# Patient Record
Sex: Female | Born: 1950 | Race: Black or African American | Hispanic: No | Smoking: Former smoker
Health system: Southern US, Community
[De-identification: ages and names within clinical notes are randomized; demographics above are authoritative.]

## PROBLEM LIST (undated history)

## (undated) DIAGNOSIS — R609 Edema, unspecified: Secondary | ICD-10-CM

## (undated) DIAGNOSIS — N189 Chronic kidney disease, unspecified: Secondary | ICD-10-CM

## (undated) DIAGNOSIS — I639 Cerebral infarction, unspecified: Secondary | ICD-10-CM

## (undated) DIAGNOSIS — I10 Essential (primary) hypertension: Secondary | ICD-10-CM

## (undated) DIAGNOSIS — R0600 Dyspnea, unspecified: Secondary | ICD-10-CM

## (undated) DIAGNOSIS — R519 Headache, unspecified: Secondary | ICD-10-CM

## (undated) DIAGNOSIS — T8859XA Other complications of anesthesia, initial encounter: Secondary | ICD-10-CM

## (undated) DIAGNOSIS — F32A Depression, unspecified: Secondary | ICD-10-CM

## (undated) DIAGNOSIS — M1990 Unspecified osteoarthritis, unspecified site: Secondary | ICD-10-CM

## (undated) DIAGNOSIS — F419 Anxiety disorder, unspecified: Secondary | ICD-10-CM

## (undated) HISTORY — DX: Unspecified osteoarthritis, unspecified site: M19.90

---

## 2009-03-08 ENCOUNTER — Ambulatory Visit: Payer: Self-pay | Admitting: Cardiology

## 2009-03-08 ENCOUNTER — Inpatient Hospital Stay (HOSPITAL_COMMUNITY): Admission: EM | Admit: 2009-03-08 | Discharge: 2009-03-12 | Payer: Self-pay | Admitting: Emergency Medicine

## 2009-03-09 ENCOUNTER — Ambulatory Visit: Payer: Self-pay | Admitting: Vascular Surgery

## 2009-03-09 ENCOUNTER — Ambulatory Visit: Payer: Self-pay | Admitting: Physical Medicine & Rehabilitation

## 2009-03-09 ENCOUNTER — Encounter (INDEPENDENT_AMBULATORY_CARE_PROVIDER_SITE_OTHER): Payer: Self-pay | Admitting: Internal Medicine

## 2009-03-12 ENCOUNTER — Encounter (INDEPENDENT_AMBULATORY_CARE_PROVIDER_SITE_OTHER): Payer: Self-pay | Admitting: Internal Medicine

## 2009-10-02 ENCOUNTER — Emergency Department (HOSPITAL_COMMUNITY): Admission: EM | Admit: 2009-10-02 | Discharge: 2009-10-02 | Payer: Self-pay | Admitting: Emergency Medicine

## 2009-10-02 ENCOUNTER — Encounter (INDEPENDENT_AMBULATORY_CARE_PROVIDER_SITE_OTHER): Payer: Self-pay | Admitting: Emergency Medicine

## 2009-10-02 ENCOUNTER — Ambulatory Visit: Payer: Self-pay | Admitting: Vascular Surgery

## 2009-11-28 ENCOUNTER — Inpatient Hospital Stay (HOSPITAL_COMMUNITY): Admission: EM | Admit: 2009-11-28 | Discharge: 2009-11-30 | Payer: Self-pay | Admitting: Emergency Medicine

## 2009-11-28 ENCOUNTER — Encounter (INDEPENDENT_AMBULATORY_CARE_PROVIDER_SITE_OTHER): Payer: Self-pay | Admitting: Internal Medicine

## 2009-11-29 ENCOUNTER — Ambulatory Visit: Payer: Self-pay | Admitting: Surgery

## 2009-11-29 ENCOUNTER — Encounter (INDEPENDENT_AMBULATORY_CARE_PROVIDER_SITE_OTHER): Payer: Self-pay | Admitting: Internal Medicine

## 2010-04-01 ENCOUNTER — Encounter
Admission: RE | Admit: 2010-04-01 | Discharge: 2010-04-01 | Payer: Self-pay | Source: Home / Self Care | Attending: Internal Medicine | Admitting: Internal Medicine

## 2010-04-10 ENCOUNTER — Encounter
Admission: RE | Admit: 2010-04-10 | Discharge: 2010-04-10 | Payer: Self-pay | Source: Home / Self Care | Attending: Internal Medicine | Admitting: Internal Medicine

## 2010-04-14 ENCOUNTER — Encounter: Payer: Self-pay | Admitting: Internal Medicine

## 2010-04-16 ENCOUNTER — Other Ambulatory Visit: Payer: Self-pay | Admitting: Radiology

## 2010-04-16 ENCOUNTER — Encounter
Admission: RE | Admit: 2010-04-16 | Discharge: 2010-04-16 | Payer: Self-pay | Source: Home / Self Care | Attending: Internal Medicine | Admitting: Internal Medicine

## 2010-06-06 LAB — DIFFERENTIAL
Basophils Absolute: 0 10*3/uL (ref 0.0–0.1)
Basophils Relative: 0 % (ref 0–1)
Lymphocytes Relative: 40 % (ref 12–46)
Monocytes Absolute: 0.4 10*3/uL (ref 0.1–1.0)
Neutro Abs: 2.4 10*3/uL (ref 1.7–7.7)
Neutrophils Relative %: 50 % (ref 43–77)

## 2010-06-06 LAB — CBC
MCHC: 34.2 g/dL (ref 30.0–36.0)
Platelets: 218 10*3/uL (ref 150–400)
RDW: 13.3 % (ref 11.5–15.5)

## 2010-06-06 LAB — COMPREHENSIVE METABOLIC PANEL
Albumin: 3.7 g/dL (ref 3.5–5.2)
BUN: 8 mg/dL (ref 6–23)
Calcium: 9.5 mg/dL (ref 8.4–10.5)
Chloride: 102 mEq/L (ref 96–112)
Creatinine, Ser: 1.01 mg/dL (ref 0.4–1.2)
Total Bilirubin: 0.4 mg/dL (ref 0.3–1.2)

## 2010-06-06 LAB — LIPID PANEL
Total CHOL/HDL Ratio: 2.1 RATIO
Triglycerides: 45 mg/dL (ref <150)
VLDL: 9 mg/dL (ref 0–40)

## 2010-06-06 LAB — CARDIAC PANEL(CRET KIN+CKTOT+MB+TROPI)
CK, MB: 0.7 ng/mL (ref 0.3–4.0)
Total CK: 49 U/L (ref 7–177)

## 2010-06-06 LAB — GLUCOSE, CAPILLARY
Glucose-Capillary: 101 mg/dL — ABNORMAL HIGH (ref 70–99)
Glucose-Capillary: 166 mg/dL — ABNORMAL HIGH (ref 70–99)

## 2010-06-06 LAB — HEMOGLOBIN A1C: Mean Plasma Glucose: 151 mg/dL — ABNORMAL HIGH (ref <117)

## 2010-06-06 LAB — APTT: aPTT: 34 seconds (ref 24–37)

## 2010-06-06 LAB — URINALYSIS, ROUTINE W REFLEX MICROSCOPIC
Glucose, UA: 100 mg/dL — AB
Hgb urine dipstick: NEGATIVE
Protein, ur: NEGATIVE mg/dL
Specific Gravity, Urine: 1.01 (ref 1.005–1.030)

## 2010-06-06 LAB — PROTIME-INR: Prothrombin Time: 14.3 seconds (ref 11.6–15.2)

## 2010-06-06 LAB — BASIC METABOLIC PANEL
CO2: 30 mEq/L (ref 19–32)
Chloride: 101 mEq/L (ref 96–112)
Glucose, Bld: 115 mg/dL — ABNORMAL HIGH (ref 70–99)
Potassium: 3.3 mEq/L — ABNORMAL LOW (ref 3.5–5.1)
Sodium: 139 mEq/L (ref 135–145)

## 2010-06-09 LAB — COMPREHENSIVE METABOLIC PANEL
Albumin: 3.9 g/dL (ref 3.5–5.2)
BUN: 8 mg/dL (ref 6–23)
Creatinine, Ser: 0.88 mg/dL (ref 0.4–1.2)
Glucose, Bld: 201 mg/dL — ABNORMAL HIGH (ref 70–99)
Total Bilirubin: 0.4 mg/dL (ref 0.3–1.2)
Total Protein: 7.5 g/dL (ref 6.0–8.3)

## 2010-06-24 LAB — URINALYSIS, ROUTINE W REFLEX MICROSCOPIC
Glucose, UA: 500 mg/dL — AB
Ketones, ur: 15 mg/dL — AB
Nitrite: NEGATIVE
Protein, ur: NEGATIVE mg/dL
pH: 5 (ref 5.0–8.0)

## 2010-06-24 LAB — HOMOCYSTEINE: Homocysteine: 11.3 umol/L (ref 4.0–15.4)

## 2010-06-24 LAB — CK TOTAL AND CKMB (NOT AT ARMC)
CK, MB: 0.6 ng/mL (ref 0.3–4.0)
Total CK: 44 U/L (ref 7–177)

## 2010-06-24 LAB — LIPID PANEL
HDL: 34 mg/dL — ABNORMAL LOW (ref >39)
LDL Cholesterol: 65 mg/dL (ref 0–99)
Triglycerides: 97 mg/dL (ref <150)
VLDL: 19 mg/dL (ref 0–40)

## 2010-06-24 LAB — GLUCOSE, CAPILLARY
Glucose-Capillary: 100 mg/dL — ABNORMAL HIGH (ref 70–99)
Glucose-Capillary: 105 mg/dL — ABNORMAL HIGH (ref 70–99)
Glucose-Capillary: 106 mg/dL — ABNORMAL HIGH (ref 70–99)
Glucose-Capillary: 109 mg/dL — ABNORMAL HIGH (ref 70–99)
Glucose-Capillary: 109 mg/dL — ABNORMAL HIGH (ref 70–99)
Glucose-Capillary: 116 mg/dL — ABNORMAL HIGH (ref 70–99)
Glucose-Capillary: 117 mg/dL — ABNORMAL HIGH (ref 70–99)
Glucose-Capillary: 129 mg/dL — ABNORMAL HIGH (ref 70–99)
Glucose-Capillary: 68 mg/dL — ABNORMAL LOW (ref 70–99)
Glucose-Capillary: 90 mg/dL (ref 70–99)

## 2010-06-24 LAB — CBC
MCV: 82.5 fL (ref 78.0–100.0)
Platelets: 219 10*3/uL (ref 150–400)
RBC: 5.44 MIL/uL — ABNORMAL HIGH (ref 3.87–5.11)
WBC: 5.7 10*3/uL (ref 4.0–10.5)

## 2010-06-24 LAB — DIFFERENTIAL
Eosinophils Absolute: 0.1 10*3/uL (ref 0.0–0.7)
Lymphocytes Relative: 21 % (ref 12–46)
Lymphs Abs: 1.2 10*3/uL (ref 0.7–4.0)
Monocytes Relative: 7 % (ref 3–12)
Neutro Abs: 4 10*3/uL (ref 1.7–7.7)
Neutrophils Relative %: 70 % (ref 43–77)

## 2010-06-24 LAB — PROTIME-INR
INR: 1.06 (ref 0.00–1.49)
Prothrombin Time: 13.7 seconds (ref 11.6–15.2)

## 2010-06-24 LAB — APTT: aPTT: 33 seconds (ref 24–37)

## 2010-06-24 LAB — POCT CARDIAC MARKERS
CKMB, poc: <1 ng/mL — ABNORMAL LOW (ref 1.0–8.0)
Myoglobin, poc: 78.1 ng/mL (ref 12–200)
Troponin i, poc: <0.05 ng/mL (ref 0.00–0.09)

## 2010-06-24 LAB — COMPREHENSIVE METABOLIC PANEL
AST: 20 U/L (ref 0–37)
Albumin: 3.6 g/dL (ref 3.5–5.2)
Alkaline Phosphatase: 69 U/L (ref 39–117)
BUN: 10 mg/dL (ref 6–23)
Chloride: 103 mEq/L (ref 96–112)
GFR calc Af Amer: >60 mL/min (ref >60)
Potassium: 3.6 mEq/L (ref 3.5–5.1)
Total Protein: 7.4 g/dL (ref 6.0–8.3)

## 2010-06-24 LAB — BASIC METABOLIC PANEL
BUN: 10 mg/dL (ref 6–23)
Chloride: 104 mEq/L (ref 96–112)
Creatinine, Ser: 0.86 mg/dL (ref 0.4–1.2)
GFR calc Af Amer: >60 mL/min (ref >60)
GFR calc non Af Amer: >60 mL/min (ref >60)

## 2011-09-07 ENCOUNTER — Emergency Department (HOSPITAL_COMMUNITY)
Admission: EM | Admit: 2011-09-07 | Discharge: 2011-09-07 | Disposition: A | Discharge status: Home / Self Care | Payer: Medicare Other | Attending: Emergency Medicine | Admitting: Emergency Medicine

## 2011-09-07 ENCOUNTER — Encounter (HOSPITAL_COMMUNITY): Payer: Self-pay | Admitting: *Deleted

## 2011-09-07 DIAGNOSIS — I10 Essential (primary) hypertension: Secondary | ICD-10-CM | POA: Insufficient documentation

## 2011-09-07 DIAGNOSIS — X58XXXA Exposure to other specified factors, initial encounter: Secondary | ICD-10-CM | POA: Insufficient documentation

## 2011-09-07 DIAGNOSIS — E119 Type 2 diabetes mellitus without complications: Secondary | ICD-10-CM | POA: Insufficient documentation

## 2011-09-07 DIAGNOSIS — Z8673 Personal history of transient ischemic attack (TIA), and cerebral infarction without residual deficits: Secondary | ICD-10-CM | POA: Insufficient documentation

## 2011-09-07 DIAGNOSIS — IMO0002 Reserved for concepts with insufficient information to code with codable children: Secondary | ICD-10-CM | POA: Insufficient documentation

## 2011-09-07 DIAGNOSIS — R600 Localized edema: Secondary | ICD-10-CM

## 2011-09-07 DIAGNOSIS — R609 Edema, unspecified: Secondary | ICD-10-CM | POA: Insufficient documentation

## 2011-09-07 DIAGNOSIS — S46912A Strain of unspecified muscle, fascia and tendon at shoulder and upper arm level, left arm, initial encounter: Secondary | ICD-10-CM

## 2011-09-07 DIAGNOSIS — M7989 Other specified soft tissue disorders: Secondary | ICD-10-CM

## 2011-09-07 HISTORY — DX: Cerebral infarction, unspecified: I63.9

## 2011-09-07 HISTORY — DX: Essential (primary) hypertension: I10

## 2011-09-07 LAB — POCT I-STAT, CHEM 8
BUN: 14 mg/dL (ref 6–23)
Calcium, Ion: 1.26 mmol/L (ref 1.12–1.32)
Chloride: 104 meq/L (ref 96–112)
Creatinine, Ser: 1.2 mg/dL — ABNORMAL HIGH (ref 0.50–1.10)
Glucose, Bld: 97 mg/dL (ref 70–99)
HCT: 41 % (ref 36.0–46.0)
Hemoglobin: 13.9 g/dL (ref 12.0–15.0)
Potassium: 3.9 mEq/L (ref 3.5–5.1)
Sodium: 141 mEq/L (ref 135–145)
TCO2: 26 mmol/L (ref 0–100)

## 2011-09-07 MED ORDER — IBUPROFEN 200 MG PO TABS: 600.0000 mg | ORAL_TABLET | Freq: Once | ORAL | Status: DC

## 2011-09-07 NOTE — ED Notes (Signed)
Patient is alert and oriented x3.  Patient is being wheeled out to the waiting room where a friend is waiting to take her home.  Patient has no complaints of pain.  No further action taken. Vital signs are recorded.

## 2011-09-07 NOTE — Progress Notes (Signed)
*  PRELIMINARY RESULTS* Vascular Ultrasound Right lower extremity venous duplex has been completed.  Preliminary findings: Right= No evidence of DVT.  Farrel Demark , RDMS 09/07/2011, 1:43 PM

## 2011-09-07 NOTE — Discharge Instructions (Signed)
Edema Edema is an abnormal build-up of fluids in tissues. Because this is partly dependent on gravity (water flows to the lowest place), it is more common in the leg sand thighs (lower extremities). It is also common in the looser tissues, like around the eyes. Painless swelling of the feet and ankles is common and increases as a person ages. It may affect both legs and may include the calves or even thighs. When squeezed, the fluid may move out of the affected area and may leave a dent for a few moments. CAUSES   Prolonged standing or sitting in one place for extended periods of time. Movement helps pump tissue fluid into the veins, and absence of movement prevents this, resulting in edema.   Varicose veins. The valves in the veins do not work as well as they should. This causes fluid to leak into the tissues.   Fluid and salt overload.   Injury, burn, or surgery to the leg, ankle, or foot, may damage veins and allow fluid to leak out.   Sunburn damages vessels. Leaky vessels allow fluid to go out into the sunburned tissues.   Allergies (from insect bites or stings, medications or chemicals) cause swelling by allowing vessels to become leaky.   Protein in the blood helps keep fluid in your vessels. Low protein, as in malnutrition, allows fluid to leak out.   Hormonal changes, including pregnancy and menstruation, cause fluid retention. This fluid may leak out of vessels and cause edema.   Medications that cause fluid retention. Examples are sex hormones, blood pressure medications, steroid treatment, or anti-depressants.   Some illnesses cause edema, especially heart failure, kidney disease, or liver disease.   Surgery that cuts veins or lymph nodes, such as surgery done for the heart or for breast cancer, may result in edema.  DIAGNOSIS  Your caregiver is usually easily able to determine what is causing your swelling (edema) by simply asking what is wrong (getting a history) and examining  you (doing a physical). Sometimes x-rays, EKG (electrocardiogram or heart tracing), and blood work may be done to evaluate for underlying medical illness. TREATMENT  General treatment includes:  Leg elevation (or elevation of the affected body part).   Restriction of fluid intake.   Prevention of fluid overload.   Compression of the affected body part. Compression with elastic bandages or support stockings squeezes the tissues, preventing fluid from entering and forcing it back into the blood vessels.   Diuretics (also called water pills or fluid pills) pull fluid out of your body in the form of increased urination. These are effective in reducing the swelling, but can have side effects and must be used only under your caregiver's supervision. Diuretics are appropriate only for some types of edema.  The specific treatment can be directed at any underlying causes discovered. Heart, liver, or kidney disease should be treated appropriately. HOME CARE INSTRUCTIONS   Elevate the legs (or affected body part) above the level of the heart, while lying down.   Avoid sitting or standing still for prolonged periods of time.   Avoid putting anything directly under the knees when lying down, and do not wear constricting clothing or garters on the upper legs.   Exercising the legs causes the fluid to work back into the veins and lymphatic channels. This may help the swelling go down.   The pressure applied by elastic bandages or support stockings can help reduce ankle swelling.   A low-salt diet may help reduce fluid   retention and decrease the ankle swelling.   Take any medications exactly as prescribed.  SEEK MEDICAL CARE IF:  Your edema is not responding to recommended treatments. SEEK IMMEDIATE MEDICAL CARE IF:   You develop shortness of breath or chest pain.   You cannot breathe when you lay down; or if, while lying down, you have to get up and go to the window to get your breath.   You  are having increasing swelling without relief from treatment.   You develop a fever over 102 F (38.9 C).   You develop pain or redness in the areas that are swollen.   Tell your caregiver right away if you have gained 3 lb/1.4 kg in 1 day or 5 lb/2.3 kg in a week.  MAKE SURE YOU:   Understand these instructions.   Will watch your condition.   Will get help right away if you are not doing well or get worse.  Document Released: 03/10/2005 Document Revised: 02/27/2011 Document Reviewed: 10/27/2007 [employer] Patient Information 2012 [employer]. 

## 2011-09-07 NOTE — ED Notes (Signed)
Pt reports approx 2 week hx of right foot swelling, pt with hx of stroke effecting right side (dysarthria is her norm). States her gait has changed.

## 2011-09-07 NOTE — ED Notes (Addendum)
Pt states she has not been taking blood pressure medications, states she ran out.

## 2011-09-07 NOTE — ED Provider Notes (Signed)
History     CSN: [id number]  Arrival date & time 09/07/11  1129   First MD Initiated Contact with Patient 09/07/11 1245      Chief Complaint  Patient presents with  . Leg Swelling    (Consider location/radiation/quality/duration/timing/severity/associated sxs/prior treatment) HPI Comments: Pt reports 2 weeks of gradual onset of lower right leg and foot swelling, worse than typical.  She takes lasix for swelling normally.  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  No CP, SOB, abd pain.  No fevers, chills, no skin discoloration.    The history is provided by the patient.    Past Medical History  Diagnosis Date  . Stroke   . Hypertension   . Diabetes mellitus     History reviewed. No pertinent past surgical history.  History reviewed. No pertinent family history.  History  Substance Use Topics  . Smoking status: Not on file  . Smokeless tobacco: Not on file  . Alcohol Use: No    OB History    Grav Para Term Preterm Abortions TAB SAB Ect Mult Living                  Review of Systems  Constitutional: Negative.   Respiratory: Negative for cough and shortness of breath.   Cardiovascular: Positive for leg swelling. Negative for chest pain.  Musculoskeletal: Positive for arthralgias. Negative for joint swelling.  Skin: Negative for color change, pallor, rash and wound.    Allergies  Review of patient's allergies indicates not on file.  Home Medications   Current Outpatient Rx  Name Route Sig Dispense Refill  . CITALOPRAM HYDROBROMIDE 10 MG PO TABS Oral Take 10 mg by mouth daily.    Marland Kitchen CLOPIDOGREL BISULFATE 75 MG PO TABS Oral Take 75 mg by mouth daily.    Marland Kitchen DICLOFENAC SODIUM 50 MG PO TBEC Oral Take 50 mg by mouth 2 (two) times daily.    Marland Kitchen DOCUSATE SODIUM 100 MG PO CAPS Oral Take 100 mg by mouth 2 (two) times daily.    Marland Kitchen EZETIMIBE 10 MG PO TABS Oral Take 10 mg by mouth daily.    .  FUROSEMIDE 40 MG PO TABS Oral Take 40 mg by mouth daily.    Marland Kitchen METFORMIN HCL 500 MG PO TABS Oral Take 500 mg by mouth 2 (two) times daily with a meal.    . NIACIN ER (ANTIHYPERLIPIDEMIC) 1000 MG PO TBCR Oral Take 1,000 mg by mouth at bedtime.    Marland Kitchen SIMVASTATIN 80 MG PO TABS Oral Take 80 mg by mouth at bedtime.    Marland Kitchen ZOLPIDEM TARTRATE 5 MG PO TABS Oral Take 5 mg by mouth at bedtime as needed. For sleep      BP 174/117  Pulse 109  Temp 98.2 F (36.8 C) (Oral)  SpO2 100%  Physical Exam  Nursing note and vitals reviewed. Constitutional: She appears well-developed and well-nourished.  HENT:  Head: Normocephalic.  Abdominal: Soft.  Musculoskeletal: She exhibits edema and tenderness.       Right knee: She exhibits normal range of motion, no swelling, no deformity and no erythema. no tenderness found.       Right lower leg: She exhibits tenderness and edema. She exhibits no bony tenderness, no deformity and no laceration.       Edema is minimal, but noticeable compared to LLE.  No erythema, no wound, no skin  rash or discoloration  Neurological:       dysarthria  Skin: Skin is warm.    ED Course  Procedures (including critical care time)  RA sat 100% is interpreted to be normal  Labs Reviewed  POCT I-STAT, CHEM 8 - Abnormal; Notable for the following:    Creatinine, Ser 1.20 (*)     All other components within normal limits   No results found.   1. Peripheral edema   2. Left shoulder strain       MDM  Pt with gradual onset of some edema of  Right LE over 2 weeks, no CP, SOB. Pt has chronic weakness to right leg due to stroke.  Pretest probability in my opinion is that this is more likely cause of mild edema.  Suggested TED hose.  Pt received some from family, but she was not able to wear it herself.  DVT study prelim is neg here.  Pt then also complains of some pain to left arm gradual over the past 2 weeks.  No skin color change, no distal numbness, no distal weakness.  Pain to  left upper arm and shoulder when I range shoulder over head.  I suspect arthritis or overuse since pt has to use cane in left arm chronically.  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  Will d.c home and she understands to try RICE at home for arm, use TED hose and to follow up with PCP.  I stat 8 was unremarkable.          Gavin Pound. Anchor Dwan, MD 09/07/11 1438

## 2012-07-01 ENCOUNTER — Ambulatory Visit (HOSPITAL_COMMUNITY)
Admission: RE | Admit: 2012-07-01 | Discharge: 2012-07-01 | Disposition: A | Discharge status: Home / Self Care | Payer: Medicare Other | Source: Ambulatory Visit | Attending: Internal Medicine | Admitting: Internal Medicine

## 2012-07-01 ENCOUNTER — Other Ambulatory Visit (HOSPITAL_COMMUNITY): Payer: Self-pay | Admitting: Internal Medicine

## 2012-07-01 ENCOUNTER — Encounter (HOSPITAL_COMMUNITY): Payer: Self-pay

## 2012-07-01 DIAGNOSIS — Z8673 Personal history of transient ischemic attack (TIA), and cerebral infarction without residual deficits: Secondary | ICD-10-CM | POA: Insufficient documentation

## 2012-07-01 DIAGNOSIS — R29898 Other symptoms and signs involving the musculoskeletal system: Secondary | ICD-10-CM | POA: Insufficient documentation

## 2012-07-01 DIAGNOSIS — R4789 Other speech disturbances: Secondary | ICD-10-CM | POA: Insufficient documentation

## 2012-07-01 DIAGNOSIS — I6789 Other cerebrovascular disease: Secondary | ICD-10-CM

## 2012-07-01 DIAGNOSIS — G9389 Other specified disorders of brain: Secondary | ICD-10-CM | POA: Insufficient documentation

## 2012-07-01 DIAGNOSIS — R930 Abnormal findings on diagnostic imaging of skull and head, not elsewhere classified: Secondary | ICD-10-CM

## 2012-07-01 DIAGNOSIS — I639 Cerebral infarction, unspecified: Secondary | ICD-10-CM

## 2012-07-02 ENCOUNTER — Ambulatory Visit (HOSPITAL_COMMUNITY): Admission: RE | Admit: 2012-07-02 | Payer: Medicare Other | Source: Ambulatory Visit

## 2012-07-06 ENCOUNTER — Ambulatory Visit (HOSPITAL_COMMUNITY)
Admission: RE | Admit: 2012-07-06 | Discharge: 2012-07-06 | Disposition: A | Discharge status: Home / Self Care | Payer: Medicare Other | Source: Ambulatory Visit | Attending: Internal Medicine | Admitting: Internal Medicine

## 2012-07-06 DIAGNOSIS — Z8673 Personal history of transient ischemic attack (TIA), and cerebral infarction without residual deficits: Secondary | ICD-10-CM | POA: Insufficient documentation

## 2012-07-06 DIAGNOSIS — G319 Degenerative disease of nervous system, unspecified: Secondary | ICD-10-CM | POA: Insufficient documentation

## 2012-07-06 DIAGNOSIS — R29898 Other symptoms and signs involving the musculoskeletal system: Secondary | ICD-10-CM | POA: Insufficient documentation

## 2012-07-06 DIAGNOSIS — E119 Type 2 diabetes mellitus without complications: Secondary | ICD-10-CM | POA: Insufficient documentation

## 2012-07-06 DIAGNOSIS — I10 Essential (primary) hypertension: Secondary | ICD-10-CM | POA: Insufficient documentation

## 2012-07-06 DIAGNOSIS — M47812 Spondylosis without myelopathy or radiculopathy, cervical region: Secondary | ICD-10-CM | POA: Insufficient documentation

## 2012-07-06 MED ORDER — GADOBENATE DIMEGLUMINE 529 MG/ML IV SOLN
15.0000 mL | Freq: Once | INTRAVENOUS | Status: AC | PRN
  Administered 2012-07-06: 15 mL via INTRAVENOUS

## 2012-07-26 ENCOUNTER — Ambulatory Visit: Payer: Medicare Other | Attending: Internal Medicine | Admitting: Physical Therapy

## 2012-07-26 DIAGNOSIS — IMO0001 Reserved for inherently not codable concepts without codable children: Secondary | ICD-10-CM | POA: Insufficient documentation

## 2012-07-26 DIAGNOSIS — R269 Unspecified abnormalities of gait and mobility: Secondary | ICD-10-CM | POA: Insufficient documentation

## 2012-07-28 ENCOUNTER — Ambulatory Visit: Payer: Medicare Other | Admitting: Physical Therapy

## 2012-07-28 ENCOUNTER — Ambulatory Visit: Payer: Medicare Other | Admitting: Occupational Therapy

## 2012-08-03 ENCOUNTER — Ambulatory Visit: Payer: Medicare Other | Admitting: Physical Therapy

## 2012-08-06 ENCOUNTER — Ambulatory Visit: Payer: Medicare Other | Admitting: Physical Therapy

## 2012-08-12 ENCOUNTER — Ambulatory Visit: Payer: Medicare Other | Admitting: Physical Therapy

## 2012-08-13 ENCOUNTER — Ambulatory Visit: Payer: Medicare Other | Admitting: Occupational Therapy

## 2012-08-13 ENCOUNTER — Ambulatory Visit: Payer: Medicare Other | Admitting: Physical Therapy

## 2012-08-17 ENCOUNTER — Ambulatory Visit: Payer: Medicare Other | Admitting: Occupational Therapy

## 2012-08-17 ENCOUNTER — Ambulatory Visit: Payer: Medicare Other | Admitting: Physical Therapy

## 2012-08-19 ENCOUNTER — Ambulatory Visit: Payer: Medicare Other | Admitting: Physical Therapy

## 2012-08-19 ENCOUNTER — Ambulatory Visit: Payer: Medicare Other | Admitting: Occupational Therapy

## 2012-08-24 ENCOUNTER — Ambulatory Visit: Payer: Medicare Other | Admitting: Occupational Therapy

## 2012-08-24 ENCOUNTER — Ambulatory Visit: Payer: Medicare Other | Attending: Internal Medicine | Admitting: Physical Therapy

## 2012-08-24 DIAGNOSIS — IMO0001 Reserved for inherently not codable concepts without codable children: Secondary | ICD-10-CM | POA: Insufficient documentation

## 2012-08-24 DIAGNOSIS — R269 Unspecified abnormalities of gait and mobility: Secondary | ICD-10-CM | POA: Insufficient documentation

## 2012-08-26 ENCOUNTER — Emergency Department (HOSPITAL_COMMUNITY): Payer: Medicare Other

## 2012-08-26 ENCOUNTER — Inpatient Hospital Stay (HOSPITAL_COMMUNITY)
Admission: EM | Admit: 2012-08-26 | Discharge: 2012-08-30 | DRG: 065 | Disposition: A | Discharge status: Rehabilitation Facility | Payer: Medicare Other | Source: Intra-hospital | Attending: Internal Medicine | Admitting: Internal Medicine

## 2012-08-26 ENCOUNTER — Encounter (HOSPITAL_COMMUNITY): Payer: Self-pay | Admitting: Family Medicine

## 2012-08-26 ENCOUNTER — Ambulatory Visit: Payer: Medicare Other | Admitting: Occupational Therapy

## 2012-08-26 ENCOUNTER — Ambulatory Visit: Payer: Medicare Other | Admitting: Physical Therapy

## 2012-08-26 DIAGNOSIS — E1142 Type 2 diabetes mellitus with diabetic polyneuropathy: Secondary | ICD-10-CM

## 2012-08-26 DIAGNOSIS — F3289 Other specified depressive episodes: Secondary | ICD-10-CM | POA: Diagnosis present

## 2012-08-26 DIAGNOSIS — R279 Unspecified lack of coordination: Secondary | ICD-10-CM | POA: Diagnosis present

## 2012-08-26 DIAGNOSIS — I69959 Hemiplegia and hemiparesis following unspecified cerebrovascular disease affecting unspecified side: Secondary | ICD-10-CM

## 2012-08-26 DIAGNOSIS — I6350 Cerebral infarction due to unspecified occlusion or stenosis of unspecified cerebral artery: Secondary | ICD-10-CM

## 2012-08-26 DIAGNOSIS — Z794 Long term (current) use of insulin: Secondary | ICD-10-CM

## 2012-08-26 DIAGNOSIS — Z79899 Other long term (current) drug therapy: Secondary | ICD-10-CM

## 2012-08-26 DIAGNOSIS — Z7902 Long term (current) use of antithrombotics/antiplatelets: Secondary | ICD-10-CM

## 2012-08-26 DIAGNOSIS — I10 Essential (primary) hypertension: Secondary | ICD-10-CM | POA: Diagnosis present

## 2012-08-26 DIAGNOSIS — Z602 Problems related to living alone: Secondary | ICD-10-CM

## 2012-08-26 DIAGNOSIS — I6330 Cerebral infarction due to thrombosis of unspecified cerebral artery: Principal | ICD-10-CM | POA: Diagnosis present

## 2012-08-26 DIAGNOSIS — E1149 Type 2 diabetes mellitus with other diabetic neurological complication: Secondary | ICD-10-CM | POA: Diagnosis present

## 2012-08-26 DIAGNOSIS — Z823 Family history of stroke: Secondary | ICD-10-CM

## 2012-08-26 DIAGNOSIS — I639 Cerebral infarction, unspecified: Secondary | ICD-10-CM | POA: Diagnosis present

## 2012-08-26 DIAGNOSIS — Z9181 History of falling: Secondary | ICD-10-CM

## 2012-08-26 DIAGNOSIS — F329 Major depressive disorder, single episode, unspecified: Secondary | ICD-10-CM | POA: Diagnosis present

## 2012-08-26 DIAGNOSIS — G4700 Insomnia, unspecified: Secondary | ICD-10-CM | POA: Diagnosis present

## 2012-08-26 DIAGNOSIS — E785 Hyperlipidemia, unspecified: Secondary | ICD-10-CM

## 2012-08-26 LAB — URINALYSIS, ROUTINE W REFLEX MICROSCOPIC
Hgb urine dipstick: NEGATIVE
Ketones, ur: 40 mg/dL — AB
Nitrite: NEGATIVE
Protein, ur: NEGATIVE mg/dL
Specific Gravity, Urine: 1.017 (ref 1.005–1.030)
Urobilinogen, UA: 1 mg/dL (ref 0.0–1.0)

## 2012-08-26 LAB — DIFFERENTIAL
Eosinophils Absolute: 0.1 10*3/uL (ref 0.0–0.7)
Eosinophils Relative: 1 % (ref 0–5)
Lymphocytes Relative: 22 % (ref 12–46)
Lymphs Abs: 1.4 10*3/uL (ref 0.7–4.0)
Monocytes Relative: 7 % (ref 3–12)

## 2012-08-26 LAB — GLUCOSE, CAPILLARY
Glucose-Capillary: 126 mg/dL — ABNORMAL HIGH (ref 70–99)
Glucose-Capillary: 122 mg/dL — ABNORMAL HIGH (ref 70–99)

## 2012-08-26 LAB — COMPREHENSIVE METABOLIC PANEL
Alkaline Phosphatase: 77 U/L (ref 39–117)
BUN: 8 mg/dL (ref 6–23)
CO2: 25 mEq/L (ref 19–32)
Calcium: 9.9 mg/dL (ref 8.4–10.5)
GFR calc Af Amer: 82 mL/min — ABNORMAL LOW (ref >90)
GFR calc non Af Amer: 71 mL/min — ABNORMAL LOW (ref >90)
Glucose, Bld: 104 mg/dL — ABNORMAL HIGH (ref 70–99)
Potassium: 3.9 mEq/L (ref 3.5–5.1)
Total Protein: 7.8 g/dL (ref 6.0–8.3)

## 2012-08-26 LAB — POCT I-STAT, CHEM 8
BUN: 7 mg/dL (ref 6–23)
Calcium, Ion: 1.18 mmol/L (ref 1.13–1.30)
Chloride: 106 mEq/L (ref 96–112)
Creatinine, Ser: 0.9 mg/dL (ref 0.50–1.10)
Glucose, Bld: 88 mg/dL (ref 70–99)
TCO2: 26 mmol/L (ref 0–100)

## 2012-08-26 LAB — CBC
Hemoglobin: 13.8 g/dL (ref 12.0–15.0)
MCH: 26.4 pg (ref 26.0–34.0)
MCV: 78.5 fL (ref 78.0–100.0)
RBC: 5.22 MIL/uL — ABNORMAL HIGH (ref 3.87–5.11)

## 2012-08-26 LAB — POCT I-STAT TROPONIN I

## 2012-08-26 LAB — TROPONIN I: Troponin I: <0.3 ng/mL (ref <0.30)

## 2012-08-26 MED ORDER — LORAZEPAM 2 MG/ML IJ SOLN
1.0000 mg | INTRAMUSCULAR | Status: DC | PRN
  Administered 2012-08-26: 1 mg via INTRAVENOUS
  Filled 2012-08-26: qty 1

## 2012-08-26 MED ORDER — CITALOPRAM HYDROBROMIDE 10 MG PO TABS
10.0000 mg | ORAL_TABLET | Freq: Every day | ORAL | Status: DC
  Administered 2012-08-27 – 2012-08-30 (×4): 10 mg via ORAL
  Filled 2012-08-26 (×5): qty 1

## 2012-08-26 MED ORDER — ACETAMINOPHEN 325 MG PO TABS: 650.0000 mg | ORAL_TABLET | ORAL | Status: DC | PRN

## 2012-08-26 MED ORDER — ONDANSETRON HCL 4 MG/2ML IJ SOLN: 4.0000 mg | Freq: Three times a day (TID) | INTRAMUSCULAR | Status: AC | PRN

## 2012-08-26 MED ORDER — INSULIN ASPART 100 UNIT/ML ~~LOC~~ SOLN
0.0000 [IU] | Freq: Three times a day (TID) | SUBCUTANEOUS | Status: DC
  Administered 2012-08-26 – 2012-08-28 (×3): 1 [IU] via SUBCUTANEOUS

## 2012-08-26 MED ORDER — ACETAMINOPHEN 650 MG RE SUPP: 650.0000 mg | RECTAL | Status: DC | PRN

## 2012-08-26 MED ORDER — SODIUM CHLORIDE 0.9 % IV SOLN
INTRAVENOUS | Status: AC
  Administered 2012-08-26: 17:00:00 via INTRAVENOUS

## 2012-08-26 MED ORDER — CLONAZEPAM 0.5 MG PO TABS
0.5000 mg | ORAL_TABLET | Freq: Every day | ORAL | Status: DC | PRN
  Administered 2012-08-26: 0.5 mg via ORAL
  Filled 2012-08-26: qty 1

## 2012-08-26 MED ORDER — GADOBENATE DIMEGLUMINE 529 MG/ML IV SOLN
15.0000 mL | Freq: Once | INTRAVENOUS | Status: AC
  Administered 2012-08-26: 15 mL via INTRAVENOUS

## 2012-08-26 MED ORDER — ATORVASTATIN CALCIUM 40 MG PO TABS
40.0000 mg | ORAL_TABLET | Freq: Every day | ORAL | Status: DC
  Administered 2012-08-26 – 2012-08-29 (×4): 40 mg via ORAL
  Filled 2012-08-26 (×5): qty 1

## 2012-08-26 MED ORDER — ASPIRIN-DIPYRIDAMOLE ER 25-200 MG PO CP12
1.0000 | ORAL_CAPSULE | Freq: Two times a day (BID) | ORAL | Status: DC
  Administered 2012-08-26 – 2012-08-30 (×8): 1 via ORAL
  Filled 2012-08-26 (×9): qty 1

## 2012-08-26 MED ORDER — ENOXAPARIN SODIUM 40 MG/0.4ML ~~LOC~~ SOLN
40.0000 mg | SUBCUTANEOUS | Status: DC
  Administered 2012-08-26 – 2012-08-29 (×4): 40 mg via SUBCUTANEOUS
  Filled 2012-08-26 (×5): qty 0.4

## 2012-08-26 NOTE — ED Notes (Signed)
Pt still off the floor at MRI.

## 2012-08-26 NOTE — H&P (Signed)
[facility] History and Physical  Megean Fabio ZOX:[id number] DOB: [birth year] DOA: 08/26/2012  Referring physician: ED PCP: Dorrene German, MD  Specialists: neurology   Chief Complaint:  Chief Complaint  Patient presents with  . Gait Problem     HPI: Sophia Rodriguez is a 62 y.o. female with history of strokes - first one in 2010 and a second one in 2011  Currently on Plavix who noticed that she was weaker and had difficulty moving around the house yesterday. MRI in the ED indicates a new Acute non hemorrhagic infarct at the junction of the  right thalamus and posterior limb of the right internal capsule. She reports that she felt that her right side was weaker today than usual. No speech or sensation changes.     Review of Systems: The patient denies anorexia, fever, weight loss,, vision loss, decreased hearing, hoarseness, chest pain, syncope, dyspnea on exertion, peripheral edema, balance deficits, hemoptysis, abdominal pain, melena, hematochezia, severe indigestion/heartburn, hematuria, incontinence, genital sores, muscle weakness, suspicious skin lesions, transient blindness, depression, unusual weight change, abnormal bleeding, enlarged lymph nodes, angioedema, and breast masses.    Past Medical History  Diagnosis Date  . Stroke   . Hypertension   . Diabetes mellitus    History reviewed. No pertinent past surgical history. Social History:  reports that she does not drink alcohol. Her tobacco and drug histories are not on file.  No Known Allergies  Family History  Problem Relation Age of Onset  . Stroke Brother      Prior to Admission medications   Medication Sig Start Date End Date Taking? Authorizing Provider  citalopram (CELEXA) 10 MG tablet Take 10 mg by mouth daily.   Yes Historical Provider, MD  clonazePAM (KLONOPIN) 0.5 MG tablet Take 0.5 mg by mouth daily as needed for anxiety.   Yes Historical Provider, MD  clopidogrel (PLAVIX) 75 MG tablet Take 75 mg by  mouth daily.   Yes Historical Provider, MD  MELOXICAM PO Take 7.5 mg by mouth 2 (two) times daily as needed (pain).    Yes Historical Provider, MD  metFORMIN (GLUCOPHAGE) 500 MG tablet Take 500 mg by mouth 2 (two) times daily with a meal.   Yes Historical Provider, MD  simvastatin (ZOCOR) 80 MG tablet Take 80 mg by mouth at bedtime.   Yes Historical Provider, MD  zolpidem (AMBIEN) 5 MG tablet Take 5 mg by mouth at bedtime as needed. For sleep   Yes Historical Provider, MD   Physical Exam: Filed Vitals:   08/26/12 1032 08/26/12 1106 08/26/12 1115 08/26/12 1258  BP:  146/96 136/88 147/76  Pulse:  79 82 88  Temp: 98.4 F (36.9 C)     TempSrc:      Resp:  14  16  SpO2:  98% 97% 98%     General:  axox3  Eyes: perrla, eomi   ENT: clear pharynx  Neck: no JVD  Cardiovascular: RRR  Respiratory: ctab   Abdomen: soft, nt   Skin: warm and dry   Musculoskeletal: intact   Psychiatric: euthymic   Neurologic: cn 2-12 intact, maybe slight facial asymmetry, strength 3/5 LE,   Labs on Admission:  Basic Metabolic Panel:  Recent Labs Lab 08/26/12 1020 08/26/12 1052  NA 138 142  K 3.9 3.9  CL 103 106  CO2 25  --   GLUCOSE 104* 88  BUN 8 7  CREATININE 0.87 0.90  CALCIUM 9.9  --    Liver Function Tests:  Recent Labs Lab 08/26/12  1020  AST 12  ALT 8  ALKPHOS 77  BILITOT 0.4  PROT 7.8  ALBUMIN 3.9   No results found for this basename: LIPASE, AMYLASE,  in the last 168 hours No results found for this basename: AMMONIA,  in the last 168 hours CBC:  Recent Labs Lab 08/26/12 1020 08/26/12 1052  WBC 6.4  --   NEUTROABS 4.5  --   HGB 13.8 18.7*  HCT 41.0 55.0*  MCV 78.5  --   PLT 266  --    Cardiac Enzymes:  Recent Labs Lab 08/26/12 1020  TROPONINI <0.30    BNP (last 3 results) No results found for this basename: PROBNP,  in the last 8760 hours CBG:  Recent Labs Lab 08/26/12 1014  GLUCAP 93    Radiological Exams on Admission: Mr Shirlee Latch Wo  Contrast  08/26/2012   *RADIOLOGY REPORT*  Clinical Data:  Two day history of difficulty walking and slurred speech.  Prior infarct.  Diabetic hypertensive patient.  MRI HEAD WITHOUT CONTRAST MRA HEAD AND NECK WITHOUT CONTRAST  Technique: Multiplanar, multiecho pulse sequences of the brain and surrounding structures were obtained according to standard protocol without intravenous contrast.  Angiographic images of the head were obtained using MRA technique without contrast.  Comparison: 07/06/2012 MR.  MRI HEAD  Findings:  Acute non hemorrhagic infarct at the junction of the right thalamus and posterior limb of the right internal capsule.  Remote moderate to large size infarct medial aspect left frontal and parietal lobe in the anterior indicating artery distribution with encephalomalacia and subsequent dilation of the left lateral ventricle. Wallerian degeneration.  Remote infarct left globus pallidus.  Remote left paracentral small infarct.  Tiny remote right cerebellar infarct.  Mild small vessel disease type changes.  Mild cervical spondylotic changes without cord compression. Cervical medullary junction, pituitary region, pineal region and orbital structures unremarkable.  No intracranial mass or abnormal enhancement.  IMPRESSION: Acute non hemorrhagic infarct at the junction of the right thalamus and posterior limb of the right internal capsule.  Remote infarcts as discussed above.  MRA HEAD  Findings: Cavernous segment internal carotid artery narrowing and irregularity more notable on the left.  Hypoplastic A1 segment right anterior cerebral artery with narrowing and irregularity suggesting superimposed atherosclerotic type changes.  Mild narrowing M1 segment right middle cerebral artery.  Middle cerebral artery branch vessel mild to moderate irregularity narrowing.  Mild irregularity of the A2 segment of the anterior cerebral artery bilaterally.  Ectatic vertebral arteries and basilar artery.  The left  vertebral artery is dominant. Very mild narrowing of the left vertebral artery after the takeoff of the left PICA. Mild narrowing distal right vertebral artery.  Irregularity and narrowing of the right PICA which is smaller than the left PICA.  Mild irregularity of the basilar artery without high-grade stenosis.  Nonvisualization AICA bilaterally.  Moderate narrowing the superior cerebellar artery bilaterally.  Mild narrowing proximal right posterior cerebral artery.  P2 segment the posterior cerebral artery mild to moderate irregularity greater on the right.  Posterior cerebral artery distal branch vessel narrowing irregularity bilaterally.  No aneurysm or vascular malformation noted.  IMPRESSION: Intracranial atherosclerotic type changes as detailed above.  MRA NECK WITHOUT CONTRAST  Technique: Angiographic images of the neck were obtained using MRA technique without intravenous contrast.  Carotid stenosis measurements (when applicable) are obtained utilizing NASCET criteria, using the distal internal carotid diameter as the denominator.  Findings:  Normal configuration of the origin of the great vessels from the aortic  arch.  Mild ectasia of the common carotid artery bilaterally.  Minimal irregularity carotid bifurcation without evidence of hemodynamically significant stenosis involving either carotid bifurcation.  Mild ectasia of the vertical cervical segment of the internal carotid artery bilaterally slightly greater on the right.  Left vertebral artery is dominant.  Ectatic proximal left vertebral artery with fold and mild narrowing.  Minimal narrowing proximal right vertebral artery.  IMPRESSION: No evidence hemodynamically significant stenosis involving either carotid bifurcation.  Left vertebral artery is dominant.  Ectatic proximal left vertebral artery with fold and mild narrowing.  Minimal narrowing proximal right vertebral artery.  Critical Value/emergent results were called by telephone at the time of  interpretation on 08/26/2012 at 1:27 p.m. to Dr. Hyacinth Meeker, who verbally acknowledged these results.   Original Report Authenticated By: Lacy Duverney, M.D.   Mr Angiogram Neck W Wo Contrast  08/26/2012   *RADIOLOGY REPORT*  Clinical Data:  Two day history of difficulty walking and slurred speech.  Prior infarct.  Diabetic hypertensive patient.  MRI HEAD WITHOUT CONTRAST MRA HEAD AND NECK WITHOUT CONTRAST  Technique: Multiplanar, multiecho pulse sequences of the brain and surrounding structures were obtained according to standard protocol without intravenous contrast.  Angiographic images of the head were obtained using MRA technique without contrast.  Comparison: 07/06/2012 MR.  MRI HEAD  Findings:  Acute non hemorrhagic infarct at the junction of the right thalamus and posterior limb of the right internal capsule.  Remote moderate to large size infarct medial aspect left frontal and parietal lobe in the anterior indicating artery distribution with encephalomalacia and subsequent dilation of the left lateral ventricle. Wallerian degeneration.  Remote infarct left globus pallidus.  Remote left paracentral small infarct.  Tiny remote right cerebellar infarct.  Mild small vessel disease type changes.  Mild cervical spondylotic changes without cord compression. Cervical medullary junction, pituitary region, pineal region and orbital structures unremarkable.  No intracranial mass or abnormal enhancement.  IMPRESSION: Acute non hemorrhagic infarct at the junction of the right thalamus and posterior limb of the right internal capsule.  Remote infarcts as discussed above.  MRA HEAD  Findings: Cavernous segment internal carotid artery narrowing and irregularity more notable on the left.  Hypoplastic A1 segment right anterior cerebral artery with narrowing and irregularity suggesting superimposed atherosclerotic type changes.  Mild narrowing M1 segment right middle cerebral artery.  Middle cerebral artery branch vessel mild to  moderate irregularity narrowing.  Mild irregularity of the A2 segment of the anterior cerebral artery bilaterally.  Ectatic vertebral arteries and basilar artery.  The left vertebral artery is dominant. Very mild narrowing of the left vertebral artery after the takeoff of the left PICA. Mild narrowing distal right vertebral artery.  Irregularity and narrowing of the right PICA which is smaller than the left PICA.  Mild irregularity of the basilar artery without high-grade stenosis.  Nonvisualization AICA bilaterally.  Moderate narrowing the superior cerebellar artery bilaterally.  Mild narrowing proximal right posterior cerebral artery.  P2 segment the posterior cerebral artery mild to moderate irregularity greater on the right.  Posterior cerebral artery distal branch vessel narrowing irregularity bilaterally.  No aneurysm or vascular malformation noted.  IMPRESSION: Intracranial atherosclerotic type changes as detailed above.  MRA NECK WITHOUT CONTRAST  Technique: Angiographic images of the neck were obtained using MRA technique without intravenous contrast.  Carotid stenosis measurements (when applicable) are obtained utilizing NASCET criteria, using the distal internal carotid diameter as the denominator.  Findings:  Normal configuration of the origin of the great vessels from  the aortic arch.  Mild ectasia of the common carotid artery bilaterally.  Minimal irregularity carotid bifurcation without evidence of hemodynamically significant stenosis involving either carotid bifurcation.  Mild ectasia of the vertical cervical segment of the internal carotid artery bilaterally slightly greater on the right.  Left vertebral artery is dominant.  Ectatic proximal left vertebral artery with fold and mild narrowing.  Minimal narrowing proximal right vertebral artery.  IMPRESSION: No evidence hemodynamically significant stenosis involving either carotid bifurcation.  Left vertebral artery is dominant.  Ectatic proximal left  vertebral artery with fold and mild narrowing.  Minimal narrowing proximal right vertebral artery.  Critical Value/emergent results were called by telephone at the time of interpretation on 08/26/2012 at 1:27 p.m. to Dr. Hyacinth Meeker, who verbally acknowledged these results.   Original Report Authenticated By: Lacy Duverney, M.D.   Mr Laqueta Jean Wo Contrast  08/26/2012   *RADIOLOGY REPORT*  Clinical Data:  Two day history of difficulty walking and slurred speech.  Prior infarct.  Diabetic hypertensive patient.  MRI HEAD WITHOUT CONTRAST MRA HEAD AND NECK WITHOUT CONTRAST  Technique: Multiplanar, multiecho pulse sequences of the brain and surrounding structures were obtained according to standard protocol without intravenous contrast.  Angiographic images of the head were obtained using MRA technique without contrast.  Comparison: 07/06/2012 MR.  MRI HEAD  Findings:  Acute non hemorrhagic infarct at the junction of the right thalamus and posterior limb of the right internal capsule.  Remote moderate to large size infarct medial aspect left frontal and parietal lobe in the anterior indicating artery distribution with encephalomalacia and subsequent dilation of the left lateral ventricle. Wallerian degeneration.  Remote infarct left globus pallidus.  Remote left paracentral small infarct.  Tiny remote right cerebellar infarct.  Mild small vessel disease type changes.  Mild cervical spondylotic changes without cord compression. Cervical medullary junction, pituitary region, pineal region and orbital structures unremarkable.  No intracranial mass or abnormal enhancement.  IMPRESSION: Acute non hemorrhagic infarct at the junction of the right thalamus and posterior limb of the right internal capsule.  Remote infarcts as discussed above.  MRA HEAD  Findings: Cavernous segment internal carotid artery narrowing and irregularity more notable on the left.  Hypoplastic A1 segment right anterior cerebral artery with narrowing and  irregularity suggesting superimposed atherosclerotic type changes.  Mild narrowing M1 segment right middle cerebral artery.  Middle cerebral artery branch vessel mild to moderate irregularity narrowing.  Mild irregularity of the A2 segment of the anterior cerebral artery bilaterally.  Ectatic vertebral arteries and basilar artery.  The left vertebral artery is dominant. Very mild narrowing of the left vertebral artery after the takeoff of the left PICA. Mild narrowing distal right vertebral artery.  Irregularity and narrowing of the right PICA which is smaller than the left PICA.  Mild irregularity of the basilar artery without high-grade stenosis.  Nonvisualization AICA bilaterally.  Moderate narrowing the superior cerebellar artery bilaterally.  Mild narrowing proximal right posterior cerebral artery.  P2 segment the posterior cerebral artery mild to moderate irregularity greater on the right.  Posterior cerebral artery distal branch vessel narrowing irregularity bilaterally.  No aneurysm or vascular malformation noted.  IMPRESSION: Intracranial atherosclerotic type changes as detailed above.  MRA NECK WITHOUT CONTRAST  Technique: Angiographic images of the neck were obtained using MRA technique without intravenous contrast.  Carotid stenosis measurements (when applicable) are obtained utilizing NASCET criteria, using the distal internal carotid diameter as the denominator.  Findings:  Normal configuration of the origin of the great vessels  from the aortic arch.  Mild ectasia of the common carotid artery bilaterally.  Minimal irregularity carotid bifurcation without evidence of hemodynamically significant stenosis involving either carotid bifurcation.  Mild ectasia of the vertical cervical segment of the internal carotid artery bilaterally slightly greater on the right.  Left vertebral artery is dominant.  Ectatic proximal left vertebral artery with fold and mild narrowing.  Minimal narrowing proximal right  vertebral artery.  IMPRESSION: No evidence hemodynamically significant stenosis involving either carotid bifurcation.  Left vertebral artery is dominant.  Ectatic proximal left vertebral artery with fold and mild narrowing.  Minimal narrowing proximal right vertebral artery.  Critical Value/emergent results were called by telephone at the time of interpretation on 08/26/2012 at 1:27 p.m. to Dr. Hyacinth Meeker, who verbally acknowledged these results.   Original Report Authenticated By: Lacy Duverney, M.D.      Assessment/Plan Principal Problem:   Stroke Active Problems:   Hypertension   DM type 2 causing neurological disease   1. New internal capsule stroke - admit to neuro telemetry - change antiplatelet therapy - to aggrenox, change statin to lipitor 40 mg daily await neuro recs. Await PT/OT/ST suggestions regarding rehab 2. DM - ssi in house   Code Status: full  Family Communication: daughter  Disposition Plan: ?    Yechiel Erny [facility] Pager [phone]  If 7PM-7AM, please contact night-coverage [email or web address] Password [email or web address] 08/26/2012, 2:04 PM

## 2012-08-26 NOTE — ED Notes (Addendum)
Attempted report x1. 

## 2012-08-26 NOTE — ED Notes (Signed)
MD at bedside. (Dr. Miller) 

## 2012-08-26 NOTE — ED Notes (Signed)
Lunch tray ordered. Admission MD at bedside.

## 2012-08-26 NOTE — ED Notes (Signed)
MRI calling reporting pt will be transported soon, go ahead and give ativan. Pt claustrophobic, will be medicating with ativan per er md.

## 2012-08-26 NOTE — ED Notes (Signed)
Pt still off floor at MRI 

## 2012-08-26 NOTE — ED Notes (Addendum)
Pt sitting up in bed, eating lunch. Pt able to feed herself. No distress or difficulty swallowing noted.

## 2012-08-26 NOTE — ED Notes (Addendum)
Per pt sts since yesterday she has been having more difficulty walking. Pt currently receiving physical therapy for previous stroke. Pt having trouble getting words out when she speaks but sts from her previous stroke. No other new associated symptoms. sts pain in both legs.

## 2012-08-26 NOTE — ED Provider Notes (Signed)
History     CSN: [id number]  Arrival date & time 08/26/12  0940   First MD Initiated Contact with Patient 08/26/12 [id number]      Chief Complaint  Patient presents with  . Gait Problem    (Consider location/radiation/quality/duration/timing/severity/associated sxs/prior treatment) HPI Comments: The patient is a 62 year old female who has a history of hypertension, diabetes and a prior stroke several years ago who presents with recurrent difficulty ambulating. According to the patient she originally had a stroke they gave her right-sided weakness, required physical therapy and has done quite well. Approximately one month ago the patient started having difficulty ambulating again, was seen at her family doctor who obtained an MRI which showed the results below. She has been back working with physical therapy but noticed it yesterday in the middle of the day she developed right-sided weakness of the leg that made it almost impossible for her to ambulate. She usually walks around her house without any assistance, uses a cane when she is walking outside. She has been unable to ambulate without significant assistance over the last 12 hours. She denies headache, back pain and has no difficulty with urination but does note that her right leg feels numb compared to the left leg which is also new finding. She denies feeling off-balance, denies vertigo, denies visual disturbance and denies any changes in her speech though she does have a baseline aphasia.  Nothing seems to make this better or worse, persitent.   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  Remote infarct left basal ganglia.  Tiny remote infarct right cerebellum and left pons.  Mild small vessel disease type changes.   Global atrophy without hydrocephalus.  The history is provided by the patient, a relative and medical records.    Past Medical History  Diagnosis Date  . Stroke   . Hypertension   .  Diabetes mellitus     History reviewed. No pertinent past surgical history.  History reviewed. No pertinent family history.  History  Substance Use Topics  . Smoking status: Not on file  . Smokeless tobacco: Not on file  . Alcohol Use: No    OB History   Grav Para Term Preterm Abortions TAB SAB Ect Mult Living                  Review of Systems  All other systems reviewed and are negative.    Allergies  Review of patient's allergies indicates no known allergies.  Home Medications   Current Outpatient Rx  Name  Route  Sig  Dispense  Refill  . citalopram (CELEXA) 10 MG tablet   Oral   Take 10 mg by mouth daily.         . clonazePAM (KLONOPIN) 0.5 MG tablet   Oral   Take 0.5 mg by mouth daily as needed for anxiety.         . clopidogrel (PLAVIX) 75 MG tablet   Oral   Take 75 mg by mouth daily.         . MELOXICAM PO   Oral   Take 7.5 mg by mouth 2 (two) times daily as needed (pain).          . metFORMIN (GLUCOPHAGE) 500 MG tablet   Oral   Take 500 mg by mouth 2 (two) times daily with a meal.         . simvastatin (ZOCOR) 80 MG tablet   Oral   Take 80 mg  by mouth at bedtime.         Marland Kitchen zolpidem (AMBIEN) 5 MG tablet   Oral   Take 5 mg by mouth at bedtime as needed. For sleep           BP 147/76  Pulse 88  Temp(Src) 98.4 F (36.9 C) (Oral)  Resp 16  SpO2 98%  Physical Exam  Nursing note and vitals reviewed. Constitutional: She appears well-developed and well-nourished. No distress.  HENT:  Head: Normocephalic and atraumatic.  Mouth/Throat: Oropharynx is clear and moist. No oropharyngeal exudate.  Eyes: Conjunctivae and EOM are normal. Pupils are equal, round, and reactive to light. Right eye exhibits no discharge. Left eye exhibits no discharge. No scleral icterus.  Neck: Normal range of motion. Neck supple. No JVD present. No thyromegaly present.  Cardiovascular: Normal rate, regular rhythm, normal heart sounds and intact distal  pulses.  Exam reveals no gallop and no friction rub.   No murmur heard. Pulmonary/Chest: Effort normal and breath sounds normal. No respiratory distress. She has no wheezes. She has no rales.  Abdominal: Soft. Bowel sounds are normal. She exhibits no distension and no mass. There is no tenderness.  Musculoskeletal: Normal range of motion. She exhibits no edema and no tenderness.  Lymphadenopathy:    She has no cervical adenopathy.  Neurological: She is alert. Coordination normal.  Normal finger-nose-finger, unstable gait as the patient has a slight shuffling, very weak in the right lower extremity on ambulation however on supine testing she is able to straight leg raise for 5 seconds on both sides, right side weak compared to the left side. Strength equal at the knees bilaterally, strength equal at the ankles bilaterally, sensation to light touch is decreased to absent on the right lower extremity , reflexes are intact at the patellar tendons bilaterally. Upper extremities with normal strength and sensation, speech is clear but slow which is baseline for the patient,   Skin: Skin is warm and dry. No rash noted. No erythema.  Psychiatric: She has a normal mood and affect. Her behavior is normal.    ED Course  Procedures (including critical care time)  Labs Reviewed  CBC - Abnormal; Notable for the following:    RBC 5.22 (*)    All other components within normal limits  COMPREHENSIVE METABOLIC PANEL - Abnormal; Notable for the following:    Glucose, Bld 104 (*)    GFR calc non Af Amer 71 (*)    GFR calc Af Amer 82 (*)    All other components within normal limits  URINALYSIS, ROUTINE W REFLEX MICROSCOPIC - Abnormal; Notable for the following:    Bilirubin Urine SMALL (*)    Ketones, ur 40 (*)    All other components within normal limits  POCT I-STAT, CHEM 8 - Abnormal; Notable for the following:    Hemoglobin 18.7 (*)    HCT 55.0 (*)    All other components within normal limits  ETHANOL   PROTIME-INR  APTT  DIFFERENTIAL  TROPONIN I  GLUCOSE, CAPILLARY  POCT I-STAT TROPONIN I   Mr Shirlee Latch Wo Contrast  08/26/2012   *RADIOLOGY REPORT*  Clinical Data:  Two day history of difficulty walking and slurred speech.  Prior infarct.  Diabetic hypertensive patient.  MRI HEAD WITHOUT CONTRAST MRA HEAD AND NECK WITHOUT CONTRAST  Technique: Multiplanar, multiecho pulse sequences of the brain and surrounding structures were obtained according to standard protocol without intravenous contrast.  Angiographic images of the head were obtained using MRA  technique without contrast.  Comparison: 07/06/2012 MR.  MRI HEAD  Findings:  Acute non hemorrhagic infarct at the junction of the right thalamus and posterior limb of the right internal capsule.  Remote moderate to large size infarct medial aspect left frontal and parietal lobe in the anterior indicating artery distribution with encephalomalacia and subsequent dilation of the left lateral ventricle. Wallerian degeneration.  Remote infarct left globus pallidus.  Remote left paracentral small infarct.  Tiny remote right cerebellar infarct.  Mild small vessel disease type changes.  Mild cervical spondylotic changes without cord compression. Cervical medullary junction, pituitary region, pineal region and orbital structures unremarkable.  No intracranial mass or abnormal enhancement.  IMPRESSION: Acute non hemorrhagic infarct at the junction of the right thalamus and posterior limb of the right internal capsule.  Remote infarcts as discussed above.  MRA HEAD  Findings: Cavernous segment internal carotid artery narrowing and irregularity more notable on the left.  Hypoplastic A1 segment right anterior cerebral artery with narrowing and irregularity suggesting superimposed atherosclerotic type changes.  Mild narrowing M1 segment right middle cerebral artery.  Middle cerebral artery branch vessel mild to moderate irregularity narrowing.  Mild irregularity of the A2  segment of the anterior cerebral artery bilaterally.  Ectatic vertebral arteries and basilar artery.  The left vertebral artery is dominant. Very mild narrowing of the left vertebral artery after the takeoff of the left PICA. Mild narrowing distal right vertebral artery.  Irregularity and narrowing of the right PICA which is smaller than the left PICA.  Mild irregularity of the basilar artery without high-grade stenosis.  Nonvisualization AICA bilaterally.  Moderate narrowing the superior cerebellar artery bilaterally.  Mild narrowing proximal right posterior cerebral artery.  P2 segment the posterior cerebral artery mild to moderate irregularity greater on the right.  Posterior cerebral artery distal branch vessel narrowing irregularity bilaterally.  No aneurysm or vascular malformation noted.  IMPRESSION: Intracranial atherosclerotic type changes as detailed above.  MRA NECK WITHOUT CONTRAST  Technique: Angiographic images of the neck were obtained using MRA technique without intravenous contrast.  Carotid stenosis measurements (when applicable) are obtained utilizing NASCET criteria, using the distal internal carotid diameter as the denominator.  Findings:  Normal configuration of the origin of the great vessels from the aortic arch.  Mild ectasia of the common carotid artery bilaterally.  Minimal irregularity carotid bifurcation without evidence of hemodynamically significant stenosis involving either carotid bifurcation.  Mild ectasia of the vertical cervical segment of the internal carotid artery bilaterally slightly greater on the right.  Left vertebral artery is dominant.  Ectatic proximal left vertebral artery with fold and mild narrowing.  Minimal narrowing proximal right vertebral artery.  IMPRESSION: No evidence hemodynamically significant stenosis involving either carotid bifurcation.  Left vertebral artery is dominant.  Ectatic proximal left vertebral artery with fold and mild narrowing.  Minimal  narrowing proximal right vertebral artery.  Critical Value/emergent results were called by telephone at the time of interpretation on 08/26/2012 at 1:27 p.m. to Dr. Hyacinth Meeker, who verbally acknowledged these results.   Original Report Authenticated By: Lacy Duverney, M.D.   Mr Angiogram Neck W Wo Contrast  08/26/2012   *RADIOLOGY REPORT*  Clinical Data:  Two day history of difficulty walking and slurred speech.  Prior infarct.  Diabetic hypertensive patient.  MRI HEAD WITHOUT CONTRAST MRA HEAD AND NECK WITHOUT CONTRAST  Technique: Multiplanar, multiecho pulse sequences of the brain and surrounding structures were obtained according to standard protocol without intravenous contrast.  Angiographic images of the head were obtained  using MRA technique without contrast.  Comparison: 07/06/2012 MR.  MRI HEAD  Findings:  Acute non hemorrhagic infarct at the junction of the right thalamus and posterior limb of the right internal capsule.  Remote moderate to large size infarct medial aspect left frontal and parietal lobe in the anterior indicating artery distribution with encephalomalacia and subsequent dilation of the left lateral ventricle. Wallerian degeneration.  Remote infarct left globus pallidus.  Remote left paracentral small infarct.  Tiny remote right cerebellar infarct.  Mild small vessel disease type changes.  Mild cervical spondylotic changes without cord compression. Cervical medullary junction, pituitary region, pineal region and orbital structures unremarkable.  No intracranial mass or abnormal enhancement.  IMPRESSION: Acute non hemorrhagic infarct at the junction of the right thalamus and posterior limb of the right internal capsule.  Remote infarcts as discussed above.  MRA HEAD  Findings: Cavernous segment internal carotid artery narrowing and irregularity more notable on the left.  Hypoplastic A1 segment right anterior cerebral artery with narrowing and irregularity suggesting superimposed atherosclerotic  type changes.  Mild narrowing M1 segment right middle cerebral artery.  Middle cerebral artery branch vessel mild to moderate irregularity narrowing.  Mild irregularity of the A2 segment of the anterior cerebral artery bilaterally.  Ectatic vertebral arteries and basilar artery.  The left vertebral artery is dominant. Very mild narrowing of the left vertebral artery after the takeoff of the left PICA. Mild narrowing distal right vertebral artery.  Irregularity and narrowing of the right PICA which is smaller than the left PICA.  Mild irregularity of the basilar artery without high-grade stenosis.  Nonvisualization AICA bilaterally.  Moderate narrowing the superior cerebellar artery bilaterally.  Mild narrowing proximal right posterior cerebral artery.  P2 segment the posterior cerebral artery mild to moderate irregularity greater on the right.  Posterior cerebral artery distal branch vessel narrowing irregularity bilaterally.  No aneurysm or vascular malformation noted.  IMPRESSION: Intracranial atherosclerotic type changes as detailed above.  MRA NECK WITHOUT CONTRAST  Technique: Angiographic images of the neck were obtained using MRA technique without intravenous contrast.  Carotid stenosis measurements (when applicable) are obtained utilizing NASCET criteria, using the distal internal carotid diameter as the denominator.  Findings:  Normal configuration of the origin of the great vessels from the aortic arch.  Mild ectasia of the common carotid artery bilaterally.  Minimal irregularity carotid bifurcation without evidence of hemodynamically significant stenosis involving either carotid bifurcation.  Mild ectasia of the vertical cervical segment of the internal carotid artery bilaterally slightly greater on the right.  Left vertebral artery is dominant.  Ectatic proximal left vertebral artery with fold and mild narrowing.  Minimal narrowing proximal right vertebral artery.  IMPRESSION: No evidence hemodynamically  significant stenosis involving either carotid bifurcation.  Left vertebral artery is dominant.  Ectatic proximal left vertebral artery with fold and mild narrowing.  Minimal narrowing proximal right vertebral artery.  Critical Value/emergent results were called by telephone at the time of interpretation on 08/26/2012 at 1:27 p.m. to Dr. Hyacinth Meeker, who verbally acknowledged these results.   Original Report Authenticated By: Lacy Duverney, M.D.   Mr Laqueta Jean Wo Contrast  08/26/2012   *RADIOLOGY REPORT*  Clinical Data:  Two day history of difficulty walking and slurred speech.  Prior infarct.  Diabetic hypertensive patient.  MRI HEAD WITHOUT CONTRAST MRA HEAD AND NECK WITHOUT CONTRAST  Technique: Multiplanar, multiecho pulse sequences of the brain and surrounding structures were obtained according to standard protocol without intravenous contrast.  Angiographic images of the head were  obtained using MRA technique without contrast.  Comparison: 07/06/2012 MR.  MRI HEAD  Findings:  Acute non hemorrhagic infarct at the junction of the right thalamus and posterior limb of the right internal capsule.  Remote moderate to large size infarct medial aspect left frontal and parietal lobe in the anterior indicating artery distribution with encephalomalacia and subsequent dilation of the left lateral ventricle. Wallerian degeneration.  Remote infarct left globus pallidus.  Remote left paracentral small infarct.  Tiny remote right cerebellar infarct.  Mild small vessel disease type changes.  Mild cervical spondylotic changes without cord compression. Cervical medullary junction, pituitary region, pineal region and orbital structures unremarkable.  No intracranial mass or abnormal enhancement.  IMPRESSION: Acute non hemorrhagic infarct at the junction of the right thalamus and posterior limb of the right internal capsule.  Remote infarcts as discussed above.  MRA HEAD  Findings: Cavernous segment internal carotid artery narrowing and  irregularity more notable on the left.  Hypoplastic A1 segment right anterior cerebral artery with narrowing and irregularity suggesting superimposed atherosclerotic type changes.  Mild narrowing M1 segment right middle cerebral artery.  Middle cerebral artery branch vessel mild to moderate irregularity narrowing.  Mild irregularity of the A2 segment of the anterior cerebral artery bilaterally.  Ectatic vertebral arteries and basilar artery.  The left vertebral artery is dominant. Very mild narrowing of the left vertebral artery after the takeoff of the left PICA. Mild narrowing distal right vertebral artery.  Irregularity and narrowing of the right PICA which is smaller than the left PICA.  Mild irregularity of the basilar artery without high-grade stenosis.  Nonvisualization AICA bilaterally.  Moderate narrowing the superior cerebellar artery bilaterally.  Mild narrowing proximal right posterior cerebral artery.  P2 segment the posterior cerebral artery mild to moderate irregularity greater on the right.  Posterior cerebral artery distal branch vessel narrowing irregularity bilaterally.  No aneurysm or vascular malformation noted.  IMPRESSION: Intracranial atherosclerotic type changes as detailed above.  MRA NECK WITHOUT CONTRAST  Technique: Angiographic images of the neck were obtained using MRA technique without intravenous contrast.  Carotid stenosis measurements (when applicable) are obtained utilizing NASCET criteria, using the distal internal carotid diameter as the denominator.  Findings:  Normal configuration of the origin of the great vessels from the aortic arch.  Mild ectasia of the common carotid artery bilaterally.  Minimal irregularity carotid bifurcation without evidence of hemodynamically significant stenosis involving either carotid bifurcation.  Mild ectasia of the vertical cervical segment of the internal carotid artery bilaterally slightly greater on the right.  Left vertebral artery is  dominant.  Ectatic proximal left vertebral artery with fold and mild narrowing.  Minimal narrowing proximal right vertebral artery.  IMPRESSION: No evidence hemodynamically significant stenosis involving either carotid bifurcation.  Left vertebral artery is dominant.  Ectatic proximal left vertebral artery with fold and mild narrowing.  Minimal narrowing proximal right vertebral artery.  Critical Value/emergent results were called by telephone at the time of interpretation on 08/26/2012 at 1:27 p.m. to Dr. Hyacinth Meeker, who verbally acknowledged these results.   Original Report Authenticated By: Lacy Duverney, M.D.     1. Stroke       MDM  The patient has an EKG that shows normal sinus rhythm, no abnormalities, she has an exam consistent with a prior stroke and possibly a new stroke. The only piece of information that confuses this situation is that the patient states that yesterday in the middle of the day she spent much of the day with her leg  propped up, it was only after this time that she noticed that her leg was weak. This is not abnormal for her to do this thus for weakness was surprising. I have seen objective difficulty ambulating, the patient is tearful concerned that she may have had another stroke, vital signs at this time appear stable and the patient is appropriate for MRI.   ECG interpretation:  I have personally interpreted this ECG Date: 08/26/2012 Time:  9:45 AM Rate:   92 Rhythm:  Normal sinus rhythm Axis:   Normal LVH:   None ST segments / T waves: Normal Intervals:  Normal Impression:  Low voltage, otherwise no abnormal findings, no old EKG with which to compare   Discussed with patient her results, discussed with hospitalist, patient will need admission for acute stroke, the patient is aware, vital signs remain stable.      Vida Roller, MD 08/26/12 1352

## 2012-08-27 ENCOUNTER — Encounter (HOSPITAL_COMMUNITY): Payer: Self-pay

## 2012-08-27 DIAGNOSIS — I6330 Cerebral infarction due to thrombosis of unspecified cerebral artery: Secondary | ICD-10-CM

## 2012-08-27 DIAGNOSIS — I6789 Other cerebrovascular disease: Secondary | ICD-10-CM

## 2012-08-27 LAB — LIPID PANEL
Cholesterol: 195 mg/dL (ref 0–200)
LDL Cholesterol: 128 mg/dL — ABNORMAL HIGH (ref 0–99)
Total CHOL/HDL Ratio: 4.5 RATIO
VLDL: 24 mg/dL (ref 0–40)

## 2012-08-27 LAB — GLUCOSE, CAPILLARY: Glucose-Capillary: 122 mg/dL — ABNORMAL HIGH (ref 70–99)

## 2012-08-27 NOTE — Progress Notes (Signed)
  Echocardiogram 2D Echocardiogram has been performed.  Sophia Rodriguez 08/27/2012, 9:35 AM

## 2012-08-27 NOTE — Evaluation (Signed)
Speech Language Pathology Evaluation Patient Details Name: Sophia Rodriguez MRN: [id number] DOB: 1950-09-16 Today's Date: 08/27/2012 Time: 1478-2956 SLP Time Calculation (min): 41 min  Problem List:  Patient Active Problem List   Diagnosis Date Noted  . 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 Stroke   . Hypertension    Past Medical History:  Past Medical History  Diagnosis Date  . Stroke   . Hypertension   . Diabetes mellitus    Past Surgical History: History reviewed. No pertinent past surgical history. 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  PMH + for previous CVA x3 years ago per pt with mild residuals right hemiparesis, HTN, DM.  Order received for SLE.    Assessment / Plan / Recommendation Clinical Impression  Pt presents with functional speech, language for current environment.  She is able to follow multistep commands without difficulties and hold appropriate converstion.  Flat affect and slow rate of speech noted, which pt states is baseline.  Family not present during evaluation, therefore uncertain of information. Pt admits to word findings difficulties currently - named 9 animals in 60 seconds.  Educated pt to compensation strategies for word finding deficits.  Pt demonstrated problem solving, judgement skills by acknowledging need to use call bell for assist to prevent fall and being willing to see SLP at next venue of care to assure high level problem solving/home management skills are adequate.    Defer further SLP to next venue.  If PT/OT/nursing note functional cognitive deficits impacting therapy participation in acute care, please reconsult.  Pt states she has been through speech therapy before and she was told she knew more than the therapists.      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  Vocation: On disability   Cognition  Overall Cognitive Status: History of cognitive impairments - at baseline (pt reports baseline deficits, not worse, no family present) Arousal/Alertness: Awake/alert Orientation Level: Oriented X4 Attention: Sustained;Selective Sustained Attention: Appears intact Selective Attention: Appears intact Memory: Appears intact Awareness: Appears intact Problem Solving: Appears intact Safety/Judgment: Appears intact (need to call for assist, plan for rehab after hospital stay)  Pt required verbal cues to attend to details in picture, able to describe overall picture.     Comprehension  Auditory Comprehension Yes/No Questions: Not tested Commands: Within Functional Limits Conversation: Complex Visual Recognition/Discrimination Discrimination: Not tested Reading Comprehension Reading Status:  (pt read calendar)    Expression Expression Primary Mode of Expression: Verbal Verbal Expression Overall Verbal Expression: Appears within functional limits for tasks assessed (pt admits to mild word finding deficits, not apparent) Initiation: No impairment Repetition: No impairment Naming: No impairment Pragmatics: Impairment Impairments: Abnormal affect;Dysprosody Other Verbal Expression Comments: naming animals 9 in 60 seconds, pt admits to word finding problems prior to admission that worsened mildly  Written Expression Dominant Hand:  (pt states she is ambidexterous) Written Expression: Exceptions to [facility] (lacks significant content -  story line) Self Formulation  Ability: Paragraph (handwriting worse per pt)   Oral / Motor Oral Motor/Sensory Function Overall Oral Motor/Sensory Function: Impaired at baseline Lingual Strength: Reduced (deviates slightly left) Facial Symmetry:  (decr movement left eye brow) Velum: Within Functional Limits Mandible: Within Functional Limits Motor Speech Overall Motor Speech: Impaired at baseline (rate of speech is slow, pt states same since cva) Respiration: Within functional limits Phonation: Aphonic Resonance: Within functional limits Intelligibility: Intelligible Motor Planning: Witnin functional limits Interfering Components: Premorbid status   GO    Donavan Burnet, MS [facility] SLP [phone]

## 2012-08-27 NOTE — Progress Notes (Signed)
Rehab Admissions Coordinator Note:  Patient was screened by Sophia Rodriguez for appropriateness for an Inpatient Acute Rehab Consult. Rehab consult is pending today.  Sophia Rodriguez 08/27/2012, 3:30 PM  I can be reached at [phone].

## 2012-08-27 NOTE — Progress Notes (Signed)
[facility] PROGRESS NOTE  Sophia Rodriguez ZOX:[id number] DOB: 12/11/1950 DOA: 08/26/2012 PCP: Dorrene German, MD  Assessment/Plan: 1. New internal capsule stroke - presented out of the window for Tpa - admited to neuro telemetry - changed antiplatelet therapy to aggrenox (was on plavix PTA)  changed statin to lipitor 40 mg daily. Await PT/OT/ST suggestions regarding rehab 2. DM - await A1C - ssi   Code Status: full  Family Communication: patient  Disposition Plan: ?    Consultants:  Neurology   Procedures:  MRI  HPI/Subjective: No new complaints   Objective: Filed Vitals:   08/27/12 0022 08/27/12 0223 08/27/12 0429 08/27/12 0630  BP: 127/74 138/78 150/84 136/79  Pulse: 95 76 82 85  Temp: 98.2 F (36.8 C) 97.9 F (36.6 C) 97.8 F (36.6 C) 97.6 F (36.4 C)  TempSrc: Oral Oral Oral Oral  Resp: 20 18 20 20   Height:      Weight:      SpO2: 97% 100% 99% 100%    Intake/Output Summary (Last 24 hours) at 08/27/12 0748 Last data filed at 08/27/12 8119  Gross per 24 hour  Intake      0 ml  Output      3 ml  Net     -3 ml   Filed Weights   08/26/12 1645 08/26/12 2020  Weight: 74.844 kg (165 lb) 74.844 kg (165 lb)    Exam:   General:  axox3  Cardiovascular: rrr  Respiratory: ctab   Abdomen: soft, nt   Data Reviewed: Basic Metabolic Panel:  Recent Labs Lab 08/26/12 1020 08/26/12 1052  NA 138 142  K 3.9 3.9  CL 103 106  CO2 25  --   GLUCOSE 104* 88  BUN 8 7  CREATININE 0.87 0.90  CALCIUM 9.9  --    Liver Function Tests:  Recent Labs Lab 08/26/12 1020  AST 12  ALT 8  ALKPHOS 77  BILITOT 0.4  PROT 7.8  ALBUMIN 3.9   No results found for this basename: LIPASE, AMYLASE,  in the last 168 hours No results found for this basename: AMMONIA,  in the last 168 hours CBC:  Recent Labs Lab 08/26/12 1020 08/26/12 1052  WBC 6.4  --   NEUTROABS 4.5  --   HGB 13.8 18.7*  HCT 41.0 55.0*  MCV 78.5  --   PLT 266  --    Cardiac  Enzymes:  Recent Labs Lab 08/26/12 1020  TROPONINI <0.30   BNP (last 3 results) No results found for this basename: PROBNP,  in the last 8760 hours CBG:  Recent Labs Lab 08/26/12 1014 08/26/12 1711 08/26/12 2233 08/27/12 0635  GLUCAP 93 126* 122* 101*    No results found for this or any previous visit (from the past 240 hour(s)).   Studies: Mr Shirlee Latch JY Contrast  08/26/2012   *RADIOLOGY REPORT*  Clinical Data:  Two day history of difficulty walking and slurred speech.  Prior infarct.  Diabetic hypertensive patient.  MRI HEAD WITHOUT CONTRAST MRA HEAD AND NECK WITHOUT CONTRAST  Technique: Multiplanar, multiecho pulse sequences of the brain and surrounding structures were obtained according to standard protocol without intravenous contrast.  Angiographic images of the head were obtained using MRA technique without contrast.  Comparison: 07/06/2012 MR.  MRI HEAD  Findings:  Acute non hemorrhagic infarct at the junction of the right thalamus and posterior limb of the right internal capsule.  Remote moderate to large size infarct medial aspect left frontal and parietal lobe in  the anterior indicating artery distribution with encephalomalacia and subsequent dilation of the left lateral ventricle. Wallerian degeneration.  Remote infarct left globus pallidus.  Remote left paracentral small infarct.  Tiny remote right cerebellar infarct.  Mild small vessel disease type changes.  Mild cervical spondylotic changes without cord compression. Cervical medullary junction, pituitary region, pineal region and orbital structures unremarkable.  No intracranial mass or abnormal enhancement.  IMPRESSION: Acute non hemorrhagic infarct at the junction of the right thalamus and posterior limb of the right internal capsule.  Remote infarcts as discussed above.  MRA HEAD  Findings: Cavernous segment internal carotid artery narrowing and irregularity more notable on the left.  Hypoplastic A1 segment right anterior  cerebral artery with narrowing and irregularity suggesting superimposed atherosclerotic type changes.  Mild narrowing M1 segment right middle cerebral artery.  Middle cerebral artery branch vessel mild to moderate irregularity narrowing.  Mild irregularity of the A2 segment of the anterior cerebral artery bilaterally.  Ectatic vertebral arteries and basilar artery.  The left vertebral artery is dominant. Very mild narrowing of the left vertebral artery after the takeoff of the left PICA. Mild narrowing distal right vertebral artery.  Irregularity and narrowing of the right PICA which is smaller than the left PICA.  Mild irregularity of the basilar artery without high-grade stenosis.  Nonvisualization AICA bilaterally.  Moderate narrowing the superior cerebellar artery bilaterally.  Mild narrowing proximal right posterior cerebral artery.  P2 segment the posterior cerebral artery mild to moderate irregularity greater on the right.  Posterior cerebral artery distal branch vessel narrowing irregularity bilaterally.  No aneurysm or vascular malformation noted.  IMPRESSION: Intracranial atherosclerotic type changes as detailed above.  MRA NECK WITHOUT CONTRAST  Technique: Angiographic images of the neck were obtained using MRA technique without intravenous contrast.  Carotid stenosis measurements (when applicable) are obtained utilizing NASCET criteria, using the distal internal carotid diameter as the denominator.  Findings:  Normal configuration of the origin of the great vessels from the aortic arch.  Mild ectasia of the common carotid artery bilaterally.  Minimal irregularity carotid bifurcation without evidence of hemodynamically significant stenosis involving either carotid bifurcation.  Mild ectasia of the vertical cervical segment of the internal carotid artery bilaterally slightly greater on the right.  Left vertebral artery is dominant.  Ectatic proximal left vertebral artery with fold and mild narrowing.   Minimal narrowing proximal right vertebral artery.  IMPRESSION: No evidence hemodynamically significant stenosis involving either carotid bifurcation.  Left vertebral artery is dominant.  Ectatic proximal left vertebral artery with fold and mild narrowing.  Minimal narrowing proximal right vertebral artery.  Critical Value/emergent results were called by telephone at the time of interpretation on 08/26/2012 at 1:27 p.m. to Dr. Hyacinth Meeker, who verbally acknowledged these results.   Original Report Authenticated By: Lacy Duverney, M.D.   Mr Angiogram Neck W Wo Contrast  08/26/2012   *RADIOLOGY REPORT*  Clinical Data:  Two day history of difficulty walking and slurred speech.  Prior infarct.  Diabetic hypertensive patient.  MRI HEAD WITHOUT CONTRAST MRA HEAD AND NECK WITHOUT CONTRAST  Technique: Multiplanar, multiecho pulse sequences of the brain and surrounding structures were obtained according to standard protocol without intravenous contrast.  Angiographic images of the head were obtained using MRA technique without contrast.  Comparison: 07/06/2012 MR.  MRI HEAD  Findings:  Acute non hemorrhagic infarct at the junction of the right thalamus and posterior limb of the right internal capsule.  Remote moderate to large size infarct medial aspect left frontal and parietal  lobe in the anterior indicating artery distribution with encephalomalacia and subsequent dilation of the left lateral ventricle. Wallerian degeneration.  Remote infarct left globus pallidus.  Remote left paracentral small infarct.  Tiny remote right cerebellar infarct.  Mild small vessel disease type changes.  Mild cervical spondylotic changes without cord compression. Cervical medullary junction, pituitary region, pineal region and orbital structures unremarkable.  No intracranial mass or abnormal enhancement.  IMPRESSION: Acute non hemorrhagic infarct at the junction of the right thalamus and posterior limb of the right internal capsule.  Remote  infarcts as discussed above.  MRA HEAD  Findings: Cavernous segment internal carotid artery narrowing and irregularity more notable on the left.  Hypoplastic A1 segment right anterior cerebral artery with narrowing and irregularity suggesting superimposed atherosclerotic type changes.  Mild narrowing M1 segment right middle cerebral artery.  Middle cerebral artery branch vessel mild to moderate irregularity narrowing.  Mild irregularity of the A2 segment of the anterior cerebral artery bilaterally.  Ectatic vertebral arteries and basilar artery.  The left vertebral artery is dominant. Very mild narrowing of the left vertebral artery after the takeoff of the left PICA. Mild narrowing distal right vertebral artery.  Irregularity and narrowing of the right PICA which is smaller than the left PICA.  Mild irregularity of the basilar artery without high-grade stenosis.  Nonvisualization AICA bilaterally.  Moderate narrowing the superior cerebellar artery bilaterally.  Mild narrowing proximal right posterior cerebral artery.  P2 segment the posterior cerebral artery mild to moderate irregularity greater on the right.  Posterior cerebral artery distal branch vessel narrowing irregularity bilaterally.  No aneurysm or vascular malformation noted.  IMPRESSION: Intracranial atherosclerotic type changes as detailed above.  MRA NECK WITHOUT CONTRAST  Technique: Angiographic images of the neck were obtained using MRA technique without intravenous contrast.  Carotid stenosis measurements (when applicable) are obtained utilizing NASCET criteria, using the distal internal carotid diameter as the denominator.  Findings:  Normal configuration of the origin of the great vessels from the aortic arch.  Mild ectasia of the common carotid artery bilaterally.  Minimal irregularity carotid bifurcation without evidence of hemodynamically significant stenosis involving either carotid bifurcation.  Mild ectasia of the vertical cervical segment  of the internal carotid artery bilaterally slightly greater on the right.  Left vertebral artery is dominant.  Ectatic proximal left vertebral artery with fold and mild narrowing.  Minimal narrowing proximal right vertebral artery.  IMPRESSION: No evidence hemodynamically significant stenosis involving either carotid bifurcation.  Left vertebral artery is dominant.  Ectatic proximal left vertebral artery with fold and mild narrowing.  Minimal narrowing proximal right vertebral artery.  Critical Value/emergent results were called by telephone at the time of interpretation on 08/26/2012 at 1:27 p.m. to Dr. Hyacinth Meeker, who verbally acknowledged these results.   Original Report Authenticated By: Lacy Duverney, M.D.   Mr Laqueta Jean Wo Contrast  08/26/2012   *RADIOLOGY REPORT*  Clinical Data:  Two day history of difficulty walking and slurred speech.  Prior infarct.  Diabetic hypertensive patient.  MRI HEAD WITHOUT CONTRAST MRA HEAD AND NECK WITHOUT CONTRAST  Technique: Multiplanar, multiecho pulse sequences of the brain and surrounding structures were obtained according to standard protocol without intravenous contrast.  Angiographic images of the head were obtained using MRA technique without contrast.  Comparison: 07/06/2012 MR.  MRI HEAD  Findings:  Acute non hemorrhagic infarct at the junction of the right thalamus and posterior limb of the right internal capsule.  Remote moderate to large size infarct medial aspect left frontal and  parietal lobe in the anterior indicating artery distribution with encephalomalacia and subsequent dilation of the left lateral ventricle. Wallerian degeneration.  Remote infarct left globus pallidus.  Remote left paracentral small infarct.  Tiny remote right cerebellar infarct.  Mild small vessel disease type changes.  Mild cervical spondylotic changes without cord compression. Cervical medullary junction, pituitary region, pineal region and orbital structures unremarkable.  No intracranial  mass or abnormal enhancement.  IMPRESSION: Acute non hemorrhagic infarct at the junction of the right thalamus and posterior limb of the right internal capsule.  Remote infarcts as discussed above.  MRA HEAD  Findings: Cavernous segment internal carotid artery narrowing and irregularity more notable on the left.  Hypoplastic A1 segment right anterior cerebral artery with narrowing and irregularity suggesting superimposed atherosclerotic type changes.  Mild narrowing M1 segment right middle cerebral artery.  Middle cerebral artery branch vessel mild to moderate irregularity narrowing.  Mild irregularity of the A2 segment of the anterior cerebral artery bilaterally.  Ectatic vertebral arteries and basilar artery.  The left vertebral artery is dominant. Very mild narrowing of the left vertebral artery after the takeoff of the left PICA. Mild narrowing distal right vertebral artery.  Irregularity and narrowing of the right PICA which is smaller than the left PICA.  Mild irregularity of the basilar artery without high-grade stenosis.  Nonvisualization AICA bilaterally.  Moderate narrowing the superior cerebellar artery bilaterally.  Mild narrowing proximal right posterior cerebral artery.  P2 segment the posterior cerebral artery mild to moderate irregularity greater on the right.  Posterior cerebral artery distal branch vessel narrowing irregularity bilaterally.  No aneurysm or vascular malformation noted.  IMPRESSION: Intracranial atherosclerotic type changes as detailed above.  MRA NECK WITHOUT CONTRAST  Technique: Angiographic images of the neck were obtained using MRA technique without intravenous contrast.  Carotid stenosis measurements (when applicable) are obtained utilizing NASCET criteria, using the distal internal carotid diameter as the denominator.  Findings:  Normal configuration of the origin of the great vessels from the aortic arch.  Mild ectasia of the common carotid artery bilaterally.  Minimal  irregularity carotid bifurcation without evidence of hemodynamically significant stenosis involving either carotid bifurcation.  Mild ectasia of the vertical cervical segment of the internal carotid artery bilaterally slightly greater on the right.  Left vertebral artery is dominant.  Ectatic proximal left vertebral artery with fold and mild narrowing.  Minimal narrowing proximal right vertebral artery.  IMPRESSION: No evidence hemodynamically significant stenosis involving either carotid bifurcation.  Left vertebral artery is dominant.  Ectatic proximal left vertebral artery with fold and mild narrowing.  Minimal narrowing proximal right vertebral artery.  Critical Value/emergent results were called by telephone at the time of interpretation on 08/26/2012 at 1:27 p.m. to Dr. Hyacinth Meeker, who verbally acknowledged these results.   Original Report Authenticated By: Lacy Duverney, M.D.    Scheduled Meds: . atorvastatin  40 mg Oral q1800  . citalopram  10 mg Oral Daily  . dipyridamole-aspirin  1 capsule Oral BID  . enoxaparin (LOVENOX) injection  40 mg Subcutaneous Q24H  . insulin aspart  0-9 Units Subcutaneous TID WC   Continuous Infusions:   Principal Problem:   Stroke Active Problems:   Hypertension   DM type 2 causing neurological disease        Axxel Gude  [facility] Pager [phone]. If 7PM-7AM, please contact night-coverage at [email or web address], password [facility] 08/27/2012, 7:48 AM  LOS: 1 day

## 2012-08-27 NOTE — Evaluation (Signed)
Physical Therapy Evaluation Patient Details Name: Sophia Rodriguez MRN: [id number] DOB: 1950-04-23 Today's Date: 08/27/2012 Time: 1308-6578 PT Time Calculation (min): 28 min  PT Assessment / Plan / Recommendation Clinical Impression  Pt is an 62 y.o. 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   Pt currently requires min/mod assist with ambulation and sit <> stand transitions. She was utilizing a cane prior to admission however needed a walker today. Pt demonstrates decreased dynamic and static balance, functional strength, delyaed processing and what appears to be some difficulty with motor planning. Pt reports that she lives with daughter who works during the day, pt reports no available 24 hour care which will be needed at this time. If discharge arrangements can be made CIR an option, otherwise recommend SNF.    PT Assessment  Patient needs continued PT services    Follow Up Recommendations  SNF;Supervision for mobility/OOB;CIR    Does the patient have the potential to tolerate intense rehabilitation    potentially  Barriers to Discharge Decreased caregiver support      Equipment Recommendations  None recommended by PT    Recommendations for Other Services Rehab consult   Frequency Min 4X/week    Precautions / Restrictions Precautions Precautions: Fall Precaution Comments: 2-3 falls at home within last month per pt.    Pertinent Vitals/Pain No c/o pain. Pt did become tearful at end of session due to frustration of having another stroke. Pt provided with emotional support.      Mobility  Bed Mobility Bed Mobility: Rolling Right;Supine to Sit Rolling Right: 5: Supervision Supine to Sit: 4: Min assist Details for Bed Mobility Assistance: Cues  needed for efficieny, pt requires multiple attempts and rest breaks.  Transfers Transfers: Sit to Stand Sit to Stand: 3: Mod assist;From bed;From chair/3-in-1 Stand to Sit: 3: Mod assist Details for Transfer Assistance: Cues for anterior weight shift over base of support into standing. Initial attempt unsuccessful standing to cane, was successful with stand to RW. PT providing facilitation through hips for weight shift and hip extension.  Ambulation/Gait Ambulation/Gait Assistance: 4: Min assist;3: Mod assist Ambulation Distance (Feet): 30 Feet (and 10') Assistive device: Rolling walker Ambulation/Gait Assistance Details: Pt needing assist to negotiate RW. Cues for upright posture and Rt. LE clearance. Decreased ability to pull RtLE posteriorly when backing to chair, needed assist. At times pt would stand still and not speak, uncertain why this occured as pt did not report any dizziness or pain.  Gait Pattern: Step-to pattern;Decreased step length - right;Decreased stance time - right;Decreased weight shift to right;Decreased dorsiflexion - right;Wide base of support;Trunk flexed (decreased foot clearance bilaterally) Stairs: No Modified Rankin (Stroke Patients Only) Pre-Morbid Rankin Score: Slight disability Modified Rankin: Moderate disability        PT Diagnosis: Difficulty walking;Abnormality of gait;Generalized weakness;Altered mental status  PT Problem List: Decreased strength;Decreased activity tolerance;Decreased balance;Decreased mobility;Decreased coordination;Decreased cognition;Impaired tone;Decreased safety awareness PT Treatment Interventions: DME instruction;Gait training;Functional mobility training;Therapeutic activities;Therapeutic exercise;Balance training;Neuromuscular re-education;Cognitive remediation;Patient/family education   PT Goals Acute Rehab PT Goals PT Goal Formulation: With patient Time For Goal Achievement: 09/03/12 Potential to Achieve Goals: Good Pt will  go Supine/Side to Sit: with modified independence PT Goal: Supine/Side to Sit - Progress: Goal set today Pt will go Sit to Supine/Side: with modified independence PT Goal: Sit  to Supine/Side - Progress: Goal set today Pt will go Sit to Stand: with supervision PT Goal: Sit to Stand - Progress: Goal set today Pt will go Stand to Sit: with supervision PT Goal: Stand to Sit - Progress: Goal set today Pt will Ambulate: >150 feet;with supervision;with least restrictive assistive device PT Goal: Ambulate - Progress: Goal set today Pt will Perform Home Exercise Program: with supervision, verbal cues required/provided PT Goal: Perform Home Exercise Program - Progress: Goal set today  Visit Information  Last PT Received On: 08/27/12 Assistance Needed: +1    Subjective Data  Subjective: I'm feeling OK Patient Stated Goal: Go home   Prior Functioning  Home Living Lives With: Alone Available Help at Discharge:  (pt reports daughter may be able to help but works ) Type of Home: Apartment Home Access: Level entry Home Layout: One level Bathroom Shower/Tub: Tub/shower unit (with tub bench) Bathroom Toilet: Handicapped height Bathroom Accessibility: Yes How Accessible: Accessible via walker Home Adaptive Equipment: Walker - rolling;Straight cane;Wheelchair - manual Additional Comments: used cane PTA Prior Function Level of Independence: Independent with assistive device(s) (daughter had to bring food for a couple of weeks) Able to Take Stairs?: No Driving: No Vocation: On disability Dominant Hand:  (pt states she is ambidexterous)    Copywriter, advertising Arousal/Alertness: Awake/alert Behavior During Therapy: Flat affect Overall Cognitive Status: History of cognitive impairments - at baseline (pt reports baseline deficits, not worse, no family present) Area of Impairment: Attention;Awareness;Problem solving Current Attention Level: Sustained;Selective Problem Solving: Slow  processing;Decreased initiation;Difficulty sequencing General Comments: Pt has ~1 min delay with response to questions. Pt has decreased awareness of deficits at times then becomes overwhelmed and tearful at other times.     Extremity/Trunk Assessment Right Lower Extremity Assessment RLE ROM/Strength/Tone: Deficits RLE ROM/Strength/Tone Deficits: Decreased functional strength Rt. LE, grossly >3/5 however has increased difficulty clearing Rt. foot with ambulation. Was going to be fitted for AFO at outpatient PT day of admission RLE Sensation: WFL - Light Touch RLE Coordination: Deficits Left Lower Extremity Assessment LLE ROM/Strength/Tone: Deficits LLE ROM/Strength/Tone Deficits: Decreased functional strength but grossly >/= 4-/5.  LLE Sensation: WFL - Light Touch LLE Coordination: Deficits   Balance Balance Balance Assessed: Yes Static Standing Balance Static Standing - Balance Support: Bilateral upper extremity supported Static Standing - Level of Assistance: 4: Min assist Dynamic Standing Balance Dynamic Standing - Balance Support: Bilateral upper extremity supported Dynamic Standing - Level of Assistance: 4: Min assist Dynamic Standing - Balance Activities: Lateral lean/weight shifting;Forward lean/weight shifting  End of Session PT - End of Session Activity Tolerance: Patient tolerated treatment well Patient left: in chair;with call bell/phone within reach  GP     Wilhemina Bonito 08/27/2012, 12:25 PM

## 2012-08-27 NOTE — Progress Notes (Signed)
UR complete.  Adriane Guglielmo RN, MSN 

## 2012-08-27 NOTE — Progress Notes (Signed)
VASCULAR LAB PRELIMINARY  PRELIMINARY  PRELIMINARY  PRELIMINARY  Carotid duplex  completed.    Preliminary report:  Bilateral:  No evidence of hemodynamically significant internal carotid artery stenosis.   Vertebral artery flow is antegrade.      Davina Howlett, RVT 08/27/2012, 9:40 AM

## 2012-08-27 NOTE — Evaluation (Signed)
Occupational Therapy Evaluation Patient Details Name: Sophia Rodriguez MRN: [id number] DOB: 1951/02/14 Today's Date: 08/27/2012 Time: 4098-1191 OT Time Calculation (min): 39 min  OT Assessment / Plan / Recommendation Clinical Impression    Pt is an 62 y.o. 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 Pt presents with below problem list. Pt requiring Mod A initially for ambulation, but progressed to Min A in session. Pt demonstrating slow processing during session. Pt reports that she lives with daughter who works during the day, pt reports no available 24 hour care which will be needed at this time. Feel pt would benefit from CIR. Pt agreeable.      OT Assessment  Patient needs continued OT Services    Follow Up Recommendations  CIR;Supervision/Assistance - 24 hour    Barriers to Discharge Decreased caregiver support    Equipment Recommendations  None recommended by OT    Recommendations for Other Services    Frequency  Min 2X/week    Precautions / Restrictions Precautions Precautions: Fall Precaution Comments: 2-3 falls at home within last month per pt.    Pertinent Vitals/Pain No pain reported.     ADL  Eating/Feeding: Independent Where Assessed - Eating/Feeding: Chair Grooming: Performed;Wash/dry hands;Wash/dry face;Min guard Where Assessed - Grooming: Unsupported standing Upper Body Bathing: Set up;Supervision/safety Where Assessed - Upper Body Bathing: Supported sitting Lower Body Bathing: Minimal assistance Where Assessed - Lower Body Bathing: Supported sit to stand Upper Body Dressing: Set up;Supervision/safety Where Assessed - Upper Body Dressing: Supported sitting Lower Body Dressing: Minimal assistance Where Assessed - Lower Body Dressing:  Supported sit to Pharmacist, hospital: Performed;Minimal assistance;Min guard Statistician Method: Sit to stand ([place]-sit to stand with grab bars and Min A-stand to sit) Acupuncturist: Comfort height toilet; grab bars Toileting - Clothing Manipulation and Hygiene: Performed;Minimal assistance Where Assessed - Engineer, mining and Hygiene: Sit to stand from 3-in-1 or toilet Tub/Shower Transfer Method: Not assessed Equipment Used: Gait belt;Rolling walker Transfers/Ambulation Related to ADLs: Pt at Mod A level for ambulation initially as she has weakness in RLE and is requiring assistance to advance RLE, however, progressed to Min A for ambulation.  ADL Comments: Pt performed grooming at sink and toilet transfer. Min A for toilet hygiene/clothing management. Pt at Min A level for LB ADLs.     OT Diagnosis: Cognitive deficits;Paresis;Generalized weakness  OT Problem List: Decreased strength;Decreased activity tolerance;Impaired balance (sitting and/or standing);Decreased safety awareness;Decreased cognition;Decreased knowledge of use of DME or AE OT Treatment Interventions: Self-care/ADL training;DME and/or AE instruction;Therapeutic activities;Cognitive remediation/compensation;Patient/family education;Balance training;Neuromuscular education   OT Goals Acute Rehab OT Goals OT Goal Formulation: With patient Time For Goal Achievement: 09/03/12 Potential to Achieve Goals: Good ADL Goals Pt Will Perform Grooming: Standing at sink;with supervision ADL Goal: Grooming - Progress: Goal set today Pt Will Perform Lower Body Bathing: with supervision;Sit to stand from chair ADL Goal: Lower Body Bathing - Progress: Goal set today Pt Will Perform Lower Body Dressing: with supervision;Sit to stand from bed;Sit to stand from chair ADL Goal: Lower Body Dressing - Progress: Goal set today Pt Will Transfer to Toilet: Ambulation;with DME;with supervision ADL Goal: Toilet  Transfer - Progress: Goal set today Pt Will Perform Toileting - Clothing Manipulation: with supervision;Standing ADL Goal: Toileting - Clothing Manipulation - Progress: Goal set today Pt  Will Perform Toileting - Hygiene: with supervision;Sit to stand from 3-in-1/toilet;Sitting on 3-in-1 or toilet ADL Goal: Toileting - Hygiene - Progress: Goal set today Pt Will Perform Tub/Shower Transfer: Tub transfer;with supervision;Ambulation;with DME ADL Goal: Tub/Shower Transfer - Progress: Goal set today  Visit Information  Last OT Received On: 08/27/12 Assistance Needed: +1    Subjective Data      Prior Functioning     Home Living Lives With: Alone Available Help at Discharge: Family;Available PRN/intermittently (daughter works) Type of Home: Apartment Home Access: Level entry Home Layout: One level Bathroom Shower/Tub: Engineer, manufacturing systems: Handicapped height Bathroom Accessibility: Yes How Accessible: Accessible via walker Home Adaptive Equipment: Walker - rolling;Straight cane;Wheelchair - manual;Grab bars around toilet Additional Comments: used cane PTA Prior Function Level of Independence: Independent with assistive device(s) (daughter had to bring food for a couple of weeks) Able to Take Stairs?: No Driving: No Vocation: On disability Communication Communication: No difficulties;Other (comment) (slow to respond) Dominant Hand:  (ambidexterous)         Vision/Perception Vision - History Baseline Vision: Other (comment) (wears glasses sometimes) Vision - Assessment Vision Assessment: Vision tested Tracking/Visual Pursuits: Other (comment) (pt with difficulty sustaining gaze in Lt upper quadrant) Convergence: Within functional limits Visual Fields: No apparent deficits   Cognition  Cognition Arousal/Alertness: Awake/alert Behavior During Therapy: Flat affect Overall Cognitive Status: Impaired/Different from baseline (unsure how much cogitive deficit prior  to admission) Area of Impairment: Problem solving Current Attention Level: Sustained;Selective Problem Solving: Slow processing General Comments: Pt has a delay in response to questions.     Extremity/Trunk Assessment Right Upper Extremity Assessment RUE ROM/Strength/Tone: Deficits RUE ROM/Strength/Tone Deficits: Pt demonstrating minimal elbow flexion with AROM of full shoulder flexion and also weakness in RUE as RUE drifted-demonstrating Brunstrom Stage V.  RUE Sensation: WFL - Light Touch RUE Coordination: WFL - fine motor Left Upper Extremity Assessment LUE ROM/Strength/Tone: WFL for tasks assessed LUE Sensation: WFL - Light Touch LUE Coordination: WFL - gross/fine motor     Mobility Bed Mobility Bed Mobility: Not assessed Transfers Transfers: Sit to Stand;Stand to Sit Sit to Stand: From chair/3-in-1;3: Mod assist;From Toilet; 4: Min guard Stand to Sit: 4: Min assist;To chair/3-in-1; To toilet Details for Transfer Assistance: Mod A for sit to stand from recliner chair. Min A level for stand to sit to toilet. Pt able to stand from toilet with grab bars at minguard level    Exercise        End of Session OT - End of Session Equipment Utilized During Treatment: Gait belt Activity Tolerance: Patient tolerated treatment well;Patient limited by fatigue Patient left: in chair;with call bell/phone within reach Nurse Communication: Mobility status  GO     Earlie Raveling OTR/L [phone] 08/27/2012, 3:10 PM

## 2012-08-27 NOTE — Consult Note (Signed)
Referring Physician: Dr. Carmin Richmond    Chief Complaint: stroke  HPI:                                                                                                                                         Sophia Rodriguez is an 62 y.o. 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 She tells me that she did recover very well from her prior strokes and is quite functional. However, day before yesterday she couldn't get up from bed and when was finally able to do so she had a great deal of difficulty moving around. Stated that she felt that the right side of her body got weaker and her balance was off. Then, the next day she felt and family decided to bring her to the hospital. Sophia Rodriguez denies associated headache, vertigo, double vision, difficulty swallowing, slurred speech, language or vision impairment. She has been taking plavix consistently. MRI-DWI performed here yesterday revealed an acute lacunar infarct involving the right thalamus-posterior limb IC. MRA neck showed no hemodynamically significant carotid disease. MRA brain demonstrated mildly diffuse intracranial atherosclerotic disease and ectatic vertebral arteries and basilar arteries. TTE completed, results pending. She has been switch from plavix to aggrenox at the time of this admission.    Date last known well: 08/25/12 Time last known well: uncertain  tPA Given: no, late presentation.  Past Medical History  Diagnosis Date  . Stroke   . Hypertension   . Diabetes mellitus     History reviewed. No pertinent past surgical history.  Family History  Problem Relation Age of Onset  . Stroke Brother    Social History:  reports that she does not drink alcohol. Her tobacco and drug histories are not on file.  Allergies: No Known  Allergies  Medications:                                                                                                                           I have reviewed the patient's current medications.  ROS:  History obtained from the patient and chart review.  General ROS: negative for - chills, fatigue, fever, night sweats, weight gain or weight loss Psychological ROS: negative for - behavioral disorder, hallucinations, memory difficulties, mood swings or suicidal ideation Ophthalmic ROS: negative for - blurry vision, double vision, eye pain or loss of vision ENT ROS: negative for - epistaxis, nasal discharge, oral lesions, sore throat, tinnitus or vertigo Allergy and Immunology ROS: negative for - hives or itchy/watery eyes Hematological and Lymphatic ROS: negative for - bleeding problems, bruising or swollen lymph nodes Endocrine ROS: negative for - galactorrhea, hair pattern changes, polydipsia/polyuria or temperature intolerance Respiratory ROS: negative for - cough, hemoptysis, shortness of breath or wheezing Cardiovascular ROS: negative for - chest pain, dyspnea on exertion, edema or irregular heartbeat Gastrointestinal ROS: negative for - abdominal pain, diarrhea, hematemesis, nausea/vomiting or stool incontinence Genito-Urinary ROS: negative for - dysuria, hematuria, incontinence or urinary frequency/urgency Musculoskeletal ROS: negative for - joint swelling Neurological ROS: as noted in HPI Dermatological ROS: negative for rash and skin lesion changes   Physical exam: pleasant female in no apparent distress. Blood pressure 136/79, pulse 85, temperature 97.6 F (36.4 C), temperature source Oral, resp. rate 20, height 5\' 7"  (1.702 m), weight 74.844 kg (165 lb), SpO2 100.00%. Head: normocephalic. Neck: supple, no bruits, no JVD. Cardiac: no  murmurs. Lungs: clear. Abdomen: soft, no tender, no mass. Extremities: no edema.  Neurologic Examination:                                                                                                      Mental Status: Alert,awake, oriented x 4, thought content appropriate. Comprehension, naming, and repetition intact. Speech fluent without evidence of aphasia.  Able to follow 3 step commands without difficulty. Cranial Nerves: II: Discs flat bilaterally; Visual fields grossly normal, pupils equal, round, reactive to light and accommodation III,IV, VI: ptosis not present, extra-ocular motions intact bilaterally V; facial light touch sensation normal bilaterally VII: mild right face weakness, residual from prior strokes. VIII: hearing normal bilaterally IX,X: gag reflex present XI: bilateral shoulder shrug XII: midline tongue extension Motor: Mild right HP. Decreased rapid alternating movements left hand. Subtle weakness left LE? Tone and bulk:normal tone throughout; no atrophy noted Sensory: Pinprick and light touch intact throughout, bilaterally Deep Tendon Reflexes: 2+ and symmetric throughout Plantars: Right: upgoingt: Left: downgoing Cerebellar: normal finger-to-nose,  normal heel-to-shin test Gait: no tested. CV: pulses palpable throughout    Results for orders placed during the hospital encounter of 08/26/12 (from the past 48 hour(s))  GLUCOSE, CAPILLARY     Status: None   Collection Time    08/26/12 10:14 AM      Result Value Range   Glucose-Capillary 93  70 - 99 mg/dL  ETHANOL     Status: None   Collection Time    08/26/12 10:20 AM      Result Value Range   Alcohol, Ethyl (B) <11  0 - 11 mg/dL   Comment:            LOWEST DETECTABLE LIMIT FOR  SERUM ALCOHOL IS 11 mg/dL     FOR MEDICAL PURPOSES ONLY  PROTIME-INR     Status: None   Collection Time    08/26/12 10:20 AM      Result Value Range   Prothrombin Time 13.7  11.6 - 15.2 seconds   INR 1.06  0.00 -  1.49  APTT     Status: None   Collection Time    08/26/12 10:20 AM      Result Value Range   aPTT 35  24 - 37 seconds  CBC     Status: Abnormal   Collection Time    08/26/12 10:20 AM      Result Value Range   WBC 6.4  4.0 - 10.5 K/uL   RBC 5.22 (*) 3.87 - 5.11 MIL/uL   Hemoglobin 13.8  12.0 - 15.0 g/dL   HCT 84.1  32.4 - 40.1 %   MCV 78.5  78.0 - 100.0 fL   MCH 26.4  26.0 - 34.0 pg   MCHC 33.7  30.0 - 36.0 g/dL   RDW 02.7  25.3 - 66.4 %   Platelets 266  150 - 400 K/uL  DIFFERENTIAL     Status: None   Collection Time    08/26/12 10:20 AM      Result Value Range   Neutrophils Relative % 70  43 - 77 %   Neutro Abs 4.5  1.7 - 7.7 K/uL   Lymphocytes Relative 22  12 - 46 %   Lymphs Abs 1.4  0.7 - 4.0 K/uL   Monocytes Relative 7  3 - 12 %   Monocytes Absolute 0.4  0.1 - 1.0 K/uL   Eosinophils Relative 1  0 - 5 %   Eosinophils Absolute 0.1  0.0 - 0.7 K/uL   Basophils Relative 0  0 - 1 %   Basophils Absolute 0.0  0.0 - 0.1 K/uL  COMPREHENSIVE METABOLIC PANEL     Status: Abnormal   Collection Time    08/26/12 10:20 AM      Result Value Range   Sodium 138  135 - 145 mEq/L   Potassium 3.9  3.5 - 5.1 mEq/L   Chloride 103  96 - 112 mEq/L   CO2 25  19 - 32 mEq/L   Glucose, Bld 104 (*) 70 - 99 mg/dL   BUN 8  6 - 23 mg/dL   Creatinine, Ser 4.03  0.50 - 1.10 mg/dL   Calcium 9.9  8.4 - 47.4 mg/dL   Total Protein 7.8  6.0 - 8.3 g/dL   Albumin 3.9  3.5 - 5.2 g/dL   AST 12  0 - 37 U/L   ALT 8  0 - 35 U/L   Alkaline Phosphatase 77  39 - 117 U/L   Total Bilirubin 0.4  0.3 - 1.2 mg/dL   GFR calc non Af Amer 71 (*) >90 mL/min   GFR calc Af Amer 82 (*) >90 mL/min   Comment:            The eGFR has been calculated     using the CKD EPI equation.     This calculation has not been     validated in all clinical     situations.     eGFR's persistently     <90 mL/min signify     possible Chronic Kidney Disease.  TROPONIN I     Status: None   Collection Time    08/26/12 10:20 AM  Result Value Range   Troponin I <0.30  <0.30 ng/mL   Comment:            Due to the release kinetics of cTnI,     a negative result within the first hours     of the onset of symptoms does not rule out     myocardial infarction with certainty.     If myocardial infarction is still suspected,     repeat the test at appropriate intervals.  POCT I-STAT TROPONIN I     Status: None   Collection Time    08/26/12 10:27 AM      Result Value Range   Troponin i, poc 0.01  0.00 - 0.08 ng/mL   Comment 3            Comment: Due to the release kinetics of cTnI,     a negative result within the first hours     of the onset of symptoms does not rule out     myocardial infarction with certainty.     If myocardial infarction is still suspected,     repeat the test at appropriate intervals.  POCT I-STAT, CHEM 8     Status: Abnormal   Collection Time    08/26/12 10:52 AM      Result Value Range   Sodium 142  135 - 145 mEq/L   Potassium 3.9  3.5 - 5.1 mEq/L   Chloride 106  96 - 112 mEq/L   BUN 7  6 - 23 mg/dL   Creatinine, Ser 1.61  0.50 - 1.10 mg/dL   Glucose, Bld 88  70 - 99 mg/dL   Calcium, Ion 0.96  0.45 - 1.30 mmol/L   TCO2 26  0 - 100 mmol/L   Hemoglobin 18.7 (*) 12.0 - 15.0 g/dL   HCT 40.9 (*) 81.1 - 91.4 %  URINALYSIS, ROUTINE W REFLEX MICROSCOPIC     Status: Abnormal   Collection Time    08/26/12 11:06 AM      Result Value Range   Color, Urine YELLOW  YELLOW   APPearance CLEAR  CLEAR   Specific Gravity, Urine 1.017  1.005 - 1.030   pH 6.0  5.0 - 8.0   Glucose, UA NEGATIVE  NEGATIVE mg/dL   Hgb urine dipstick NEGATIVE  NEGATIVE   Bilirubin Urine SMALL (*) NEGATIVE   Ketones, ur 40 (*) NEGATIVE mg/dL   Protein, ur NEGATIVE  NEGATIVE mg/dL   Urobilinogen, UA 1.0  0.0 - 1.0 mg/dL   Nitrite NEGATIVE  NEGATIVE   Leukocytes, UA NEGATIVE  NEGATIVE   Comment: MICROSCOPIC NOT DONE ON URINES WITH NEGATIVE PROTEIN, BLOOD, LEUKOCYTES, NITRITE, OR GLUCOSE <1000 mg/dL.  GLUCOSE, CAPILLARY      Status: Abnormal   Collection Time    08/26/12  5:11 PM      Result Value Range   Glucose-Capillary 126 (*) 70 - 99 mg/dL  GLUCOSE, CAPILLARY     Status: Abnormal   Collection Time    08/26/12 10:33 PM      Result Value Range   Glucose-Capillary 122 (*) 70 - 99 mg/dL   Comment 1 Documented in Chart     Comment 2 Notify RN    GLUCOSE, CAPILLARY     Status: Abnormal   Collection Time    08/27/12  6:35 AM      Result Value Range   Glucose-Capillary 101 (*) 70 - 99 mg/dL   Comment 1 Documented in Chart  Comment 2 Notify RN     Mr Shirlee Latch Wo Contrast  08/26/2012   *RADIOLOGY REPORT*  Clinical Data:  Two day history of difficulty walking and slurred speech.  Prior infarct.  Diabetic hypertensive patient.  MRI HEAD WITHOUT CONTRAST MRA HEAD AND NECK WITHOUT CONTRAST  Technique: Multiplanar, multiecho pulse sequences of the brain and surrounding structures were obtained according to standard protocol without intravenous contrast.  Angiographic images of the head were obtained using MRA technique without contrast.  Comparison: 07/06/2012 MR.  MRI HEAD  Findings:  Acute non hemorrhagic infarct at the junction of the right thalamus and posterior limb of the right internal capsule.  Remote moderate to large size infarct medial aspect left frontal and parietal lobe in the anterior indicating artery distribution with encephalomalacia and subsequent dilation of the left lateral ventricle. Wallerian degeneration.  Remote infarct left globus pallidus.  Remote left paracentral small infarct.  Tiny remote right cerebellar infarct.  Mild small vessel disease type changes.  Mild cervical spondylotic changes without cord compression. Cervical medullary junction, pituitary region, pineal region and orbital structures unremarkable.  No intracranial mass or abnormal enhancement.  IMPRESSION: Acute non hemorrhagic infarct at the junction of the right thalamus and posterior limb of the right internal capsule.  Remote  infarcts as discussed above.  MRA HEAD  Findings: Cavernous segment internal carotid artery narrowing and irregularity more notable on the left.  Hypoplastic A1 segment right anterior cerebral artery with narrowing and irregularity suggesting superimposed atherosclerotic type changes.  Mild narrowing M1 segment right middle cerebral artery.  Middle cerebral artery branch vessel mild to moderate irregularity narrowing.  Mild irregularity of the A2 segment of the anterior cerebral artery bilaterally.  Ectatic vertebral arteries and basilar artery.  The left vertebral artery is dominant. Very mild narrowing of the left vertebral artery after the takeoff of the left PICA. Mild narrowing distal right vertebral artery.  Irregularity and narrowing of the right PICA which is smaller than the left PICA.  Mild irregularity of the basilar artery without high-grade stenosis.  Nonvisualization AICA bilaterally.  Moderate narrowing the superior cerebellar artery bilaterally.  Mild narrowing proximal right posterior cerebral artery.  P2 segment the posterior cerebral artery mild to moderate irregularity greater on the right.  Posterior cerebral artery distal branch vessel narrowing irregularity bilaterally.  No aneurysm or vascular malformation noted.  IMPRESSION: Intracranial atherosclerotic type changes as detailed above.  MRA NECK WITHOUT CONTRAST  Technique: Angiographic images of the neck were obtained using MRA technique without intravenous contrast.  Carotid stenosis measurements (when applicable) are obtained utilizing NASCET criteria, using the distal internal carotid diameter as the denominator.  Findings:  Normal configuration of the origin of the great vessels from the aortic arch.  Mild ectasia of the common carotid artery bilaterally.  Minimal irregularity carotid bifurcation without evidence of hemodynamically significant stenosis involving either carotid bifurcation.  Mild ectasia of the vertical cervical segment  of the internal carotid artery bilaterally slightly greater on the right.  Left vertebral artery is dominant.  Ectatic proximal left vertebral artery with fold and mild narrowing.  Minimal narrowing proximal right vertebral artery.  IMPRESSION: No evidence hemodynamically significant stenosis involving either carotid bifurcation.  Left vertebral artery is dominant.  Ectatic proximal left vertebral artery with fold and mild narrowing.  Minimal narrowing proximal right vertebral artery.  Critical Value/emergent results were called by telephone at the time of interpretation on 08/26/2012 at 1:27 p.m. to Dr. Hyacinth Meeker, who verbally acknowledged these results.  Original Report Authenticated By: Lacy Duverney, M.D.   Mr Angiogram Neck W Wo Contrast  08/26/2012   *RADIOLOGY REPORT*  Clinical Data:  Two day history of difficulty walking and slurred speech.  Prior infarct.  Diabetic hypertensive patient.  MRI HEAD WITHOUT CONTRAST MRA HEAD AND NECK WITHOUT CONTRAST  Technique: Multiplanar, multiecho pulse sequences of the brain and surrounding structures were obtained according to standard protocol without intravenous contrast.  Angiographic images of the head were obtained using MRA technique without contrast.  Comparison: 07/06/2012 MR.  MRI HEAD  Findings:  Acute non hemorrhagic infarct at the junction of the right thalamus and posterior limb of the right internal capsule.  Remote moderate to large size infarct medial aspect left frontal and parietal lobe in the anterior indicating artery distribution with encephalomalacia and subsequent dilation of the left lateral ventricle. Wallerian degeneration.  Remote infarct left globus pallidus.  Remote left paracentral small infarct.  Tiny remote right cerebellar infarct.  Mild small vessel disease type changes.  Mild cervical spondylotic changes without cord compression. Cervical medullary junction, pituitary region, pineal region and orbital structures unremarkable.  No  intracranial mass or abnormal enhancement.  IMPRESSION: Acute non hemorrhagic infarct at the junction of the right thalamus and posterior limb of the right internal capsule.  Remote infarcts as discussed above.  MRA HEAD  Findings: Cavernous segment internal carotid artery narrowing and irregularity more notable on the left.  Hypoplastic A1 segment right anterior cerebral artery with narrowing and irregularity suggesting superimposed atherosclerotic type changes.  Mild narrowing M1 segment right middle cerebral artery.  Middle cerebral artery branch vessel mild to moderate irregularity narrowing.  Mild irregularity of the A2 segment of the anterior cerebral artery bilaterally.  Ectatic vertebral arteries and basilar artery.  The left vertebral artery is dominant. Very mild narrowing of the left vertebral artery after the takeoff of the left PICA. Mild narrowing distal right vertebral artery.  Irregularity and narrowing of the right PICA which is smaller than the left PICA.  Mild irregularity of the basilar artery without high-grade stenosis.  Nonvisualization AICA bilaterally.  Moderate narrowing the superior cerebellar artery bilaterally.  Mild narrowing proximal right posterior cerebral artery.  P2 segment the posterior cerebral artery mild to moderate irregularity greater on the right.  Posterior cerebral artery distal branch vessel narrowing irregularity bilaterally.  No aneurysm or vascular malformation noted.  IMPRESSION: Intracranial atherosclerotic type changes as detailed above.  MRA NECK WITHOUT CONTRAST  Technique: Angiographic images of the neck were obtained using MRA technique without intravenous contrast.  Carotid stenosis measurements (when applicable) are obtained utilizing NASCET criteria, using the distal internal carotid diameter as the denominator.  Findings:  Normal configuration of the origin of the great vessels from the aortic arch.  Mild ectasia of the common carotid artery bilaterally.   Minimal irregularity carotid bifurcation without evidence of hemodynamically significant stenosis involving either carotid bifurcation.  Mild ectasia of the vertical cervical segment of the internal carotid artery bilaterally slightly greater on the right.  Left vertebral artery is dominant.  Ectatic proximal left vertebral artery with fold and mild narrowing.  Minimal narrowing proximal right vertebral artery.  IMPRESSION: No evidence hemodynamically significant stenosis involving either carotid bifurcation.  Left vertebral artery is dominant.  Ectatic proximal left vertebral artery with fold and mild narrowing.  Minimal narrowing proximal right vertebral artery.  Critical Value/emergent results were called by telephone at the time of interpretation on 08/26/2012 at 1:27 p.m. to Dr. Hyacinth Meeker, who verbally acknowledged these results.  Original Report Authenticated By: Lacy Duverney, M.D.   Mr Laqueta Jean Wo Contrast  08/26/2012   *RADIOLOGY REPORT*  Clinical Data:  Two day history of difficulty walking and slurred speech.  Prior infarct.  Diabetic hypertensive patient.  MRI HEAD WITHOUT CONTRAST MRA HEAD AND NECK WITHOUT CONTRAST  Technique: Multiplanar, multiecho pulse sequences of the brain and surrounding structures were obtained according to standard protocol without intravenous contrast.  Angiographic images of the head were obtained using MRA technique without contrast.  Comparison: 07/06/2012 MR.  MRI HEAD  Findings:  Acute non hemorrhagic infarct at the junction of the right thalamus and posterior limb of the right internal capsule.  Remote moderate to large size infarct medial aspect left frontal and parietal lobe in the anterior indicating artery distribution with encephalomalacia and subsequent dilation of the left lateral ventricle. Wallerian degeneration.  Remote infarct left globus pallidus.  Remote left paracentral small infarct.  Tiny remote right cerebellar infarct.  Mild small vessel disease type  changes.  Mild cervical spondylotic changes without cord compression. Cervical medullary junction, pituitary region, pineal region and orbital structures unremarkable.  No intracranial mass or abnormal enhancement.  IMPRESSION: Acute non hemorrhagic infarct at the junction of the right thalamus and posterior limb of the right internal capsule.  Remote infarcts as discussed above.  MRA HEAD  Findings: Cavernous segment internal carotid artery narrowing and irregularity more notable on the left.  Hypoplastic A1 segment right anterior cerebral artery with narrowing and irregularity suggesting superimposed atherosclerotic type changes.  Mild narrowing M1 segment right middle cerebral artery.  Middle cerebral artery branch vessel mild to moderate irregularity narrowing.  Mild irregularity of the A2 segment of the anterior cerebral artery bilaterally.  Ectatic vertebral arteries and basilar artery.  The left vertebral artery is dominant. Very mild narrowing of the left vertebral artery after the takeoff of the left PICA. Mild narrowing distal right vertebral artery.  Irregularity and narrowing of the right PICA which is smaller than the left PICA.  Mild irregularity of the basilar artery without high-grade stenosis.  Nonvisualization AICA bilaterally.  Moderate narrowing the superior cerebellar artery bilaterally.  Mild narrowing proximal right posterior cerebral artery.  P2 segment the posterior cerebral artery mild to moderate irregularity greater on the right.  Posterior cerebral artery distal branch vessel narrowing irregularity bilaterally.  No aneurysm or vascular malformation noted.  IMPRESSION: Intracranial atherosclerotic type changes as detailed above.  MRA NECK WITHOUT CONTRAST  Technique: Angiographic images of the neck were obtained using MRA technique without intravenous contrast.  Carotid stenosis measurements (when applicable) are obtained utilizing NASCET criteria, using the distal internal carotid  diameter as the denominator.  Findings:  Normal configuration of the origin of the great vessels from the aortic arch.  Mild ectasia of the common carotid artery bilaterally.  Minimal irregularity carotid bifurcation without evidence of hemodynamically significant stenosis involving either carotid bifurcation.  Mild ectasia of the vertical cervical segment of the internal carotid artery bilaterally slightly greater on the right.  Left vertebral artery is dominant.  Ectatic proximal left vertebral artery with fold and mild narrowing.  Minimal narrowing proximal right vertebral artery.  IMPRESSION: No evidence hemodynamically significant stenosis involving either carotid bifurcation.  Left vertebral artery is dominant.  Ectatic proximal left vertebral artery with fold and mild narrowing.  Minimal narrowing proximal right vertebral artery.  Critical Value/emergent results were called by telephone at the time of interpretation on 08/26/2012 at 1:27 p.m. to Dr. Hyacinth Meeker, who verbally acknowledged these results.  Original Report Authenticated By: Lacy Duverney, M.D.    Assessment and plan discussed with with attending physician and they are in agreement.      Assessment: 62 y.o. female with multiple risk factors for stroke, admitted with acute right thalamic-posterior limb IC lacunar infarct most likely resulting from small vessel disease in the context of HTN, DM.Marland Kitchen Neuro-exam significant for mild weakness left side (new) and old mild right HP. Failed plavix and switched to aggrenox. Stroke service to resume care and make further recommendations. Stroke Risk Factors - race, HTN, DM, prior strokes.  Plan: 1. HgbA1c, fasting lipid panel 2. MRI, MRA  of the brain without contrast (done) 3. Echocardiogram (done) 4. Prophylactic therapy-aggrenox 5. Risk factor modification 6. Telemetry monitoring 7. Frequent neuro checks 8. PT/OT SLP   Wyatt Portela, MD [facility] [phone]  08/27/2012,  7:45 AM

## 2012-08-27 NOTE — Consult Note (Signed)
Physical Medicine and Rehabilitation Consult Reason for Consult: CVA Referring Physician: [facility]   HPI: Sophia Rodriguez is a 62 y.o. 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 Patient lives alone and is using a cane walker prior to admission was noted history of falls. MRI of the brain showed acute nonhemorrhagic infarct at the junction of the right thalamus and posterior limb of the right internal capsule as well as remote moderate to large size infarct medial aspect left frontal and parietal lobes as well as left globus pallidus and remote left paracentral small infarct. MRA of the head with atherosclerotic type changes. MRA of the neck without significant stenosis. Echocardiogram pending. Carotid Dopplers with no ICA stenosis. Patient did not receive TPA. Neurology services consulted placed on Aggrenox therapy with Plavix being discontinued as well the addition of subcutaneous Lovenox for DVT prophylaxis. Await formal occupational physical therapy evaluations. M.D. is requested physical medicine rehabilitation consult to consider inpatient rehabilitation services.   Review of Systems  Musculoskeletal: Positive for falls.  Neurological: Positive for weakness.  Psychiatric/Behavioral: Positive for depression. The patient has insomnia.   All other systems reviewed and are negative.   Past Medical History  Diagnosis Date  . Stroke   . Hypertension   . Diabetes mellitus    History reviewed. No pertinent past surgical history. Family History  Problem Relation Age of Onset  . Stroke Brother    Social History:  reports that she does not drink alcohol. Her tobacco and drug histories are not on file. Allergies: No Known Allergies Medications Prior to Admission  Medication Sig Dispense Refill  . citalopram (CELEXA) 10 MG tablet  Take 10 mg by mouth daily.      . clonazePAM (KLONOPIN) 0.5 MG tablet Take 0.5 mg by mouth daily as needed for anxiety.      . clopidogrel (PLAVIX) 75 MG tablet Take 75 mg by mouth daily.      . MELOXICAM PO Take 7.5 mg by mouth 2 (two) times daily as needed (pain).       . metFORMIN (GLUCOPHAGE) 500 MG tablet Take 500 mg by mouth 2 (two) times daily with a meal.      . simvastatin (ZOCOR) 80 MG tablet Take 80 mg by mouth at bedtime.      Marland Kitchen zolpidem (AMBIEN) 5 MG tablet Take 5 mg by mouth at bedtime as needed. For sleep        Home: Home Living Lives With: Alone Available Help at Discharge:  (pt reports daughter may be able to help but works ) Type of Home: Apartment Home Access: Level entry Home Layout: One level Bathroom Shower/Tub: Tub/shower unit (with tub bench) Bathroom Toilet: Handicapped height Bathroom Accessibility: Yes How Accessible: Accessible via walker Home Adaptive Equipment: Walker - rolling;Straight cane;Wheelchair - manual Additional Comments: used cane PTA  Functional History: Prior Function Able to Take Stairs?: No Driving: No Vocation: On disability Functional Status:  Mobility: Bed Mobility Bed Mobility: Rolling Right;Supine to Sit Rolling Right: 5: Supervision Supine to Sit: 4: Min assist Transfers Transfers: Sit to Stand Sit to Stand: 3: Mod assist;From bed;From chair/3-in-1 Stand to Sit: 3: Mod assist Ambulation/Gait Ambulation/Gait Assistance: 4: Min assist;3: Mod assist Ambulation Distance (Feet): 30 Feet (and 10') Assistive device: Rolling walker Ambulation/Gait Assistance Details: Pt needing assist to negotiate RW. Cues for upright posture and Rt. LE clearance. Decreased ability to pull  RtLE posteriorly when backing to chair, needed assist. At times pt would stand still and not speak, uncertain why this occured as pt did not report any dizziness or pain.  Gait Pattern: Step-to pattern;Decreased step length - right;Decreased stance time -  right;Decreased weight shift to right;Decreased dorsiflexion - right;Wide base of support;Trunk flexed (decreased foot clearance bilaterally) Stairs: No    ADL:    Cognition: Cognition Overall Cognitive Status: History of cognitive impairments - at baseline (pt reports baseline deficits, not worse, no family present) Arousal/Alertness: Awake/alert Orientation Level: Oriented X4 Attention: Sustained;Selective Sustained Attention: Appears intact Selective Attention: Appears intact Memory: Appears intact Awareness: Appears intact Problem Solving: Appears intact Safety/Judgment: Appears intact (need to call for assist, plan for rehab after hospital stay) Cognition Arousal/Alertness: Awake/alert Behavior During Therapy: Flat affect Overall Cognitive Status: History of cognitive impairments - at baseline (pt reports baseline deficits, not worse, no family present) Area of Impairment: Attention;Awareness;Problem solving Current Attention Level: Sustained;Selective Problem Solving: Slow processing;Decreased initiation;Difficulty sequencing General Comments: Pt has ~1 min delay with response to questions. Pt has decreased awareness of deficits at times then becomes overwhelmed and tearful at other times.   Blood pressure 129/91, pulse 67, temperature 98.2 F (36.8 C), temperature source Oral, resp. rate 18, height 5\' 7"  (1.702 m), weight 74.844 kg (165 lb), SpO2 100.00%. Physical Exam  Vitals reviewed. Constitutional: She appears well-developed and well-nourished. No distress.  HENT:  Head: Normocephalic.  Right Ear: External ear normal.  Left Ear: External ear normal.  Eyes: Conjunctivae and EOM are normal. Pupils are equal, round, and reactive to light. Right eye exhibits no discharge. Left eye exhibits no discharge. No scleral icterus.  Neck: Normal range of motion. Neck supple. No thyromegaly present.  Cardiovascular: Normal rate and regular rhythm.   Pulmonary/Chest: Effort normal  and breath sounds normal. No respiratory distress. She has no wheezes. She has no rales.  Abdominal: Soft. Bowel sounds are normal. She exhibits no distension. There is no tenderness. There is no rebound.  Musculoskeletal: She exhibits no edema and no tenderness.  Neurological: She is alert.  Patient to provide name, age and date of birth. Follows simple commands. Mood is flat but appropriate for conversation. She does exhibit some limitations in her awareness of deficits, insight is fair. Processing is delayed.. She has a difficult time initiating and coordinating movements of her left arm and leg. Strength is grossly 4/5 in the left arm and leg as far as I can determine. Sensory exam intact to light touch and pain. Mild left tongue deviation and facial droop.   Skin: Skin is warm and dry.    Results for orders placed during the hospital encounter of 08/26/12 (from the past 24 hour(s))  GLUCOSE, CAPILLARY     Status: Abnormal   Collection Time    08/26/12  5:11 PM      Result Value Range   Glucose-Capillary 126 (*) 70 - 99 mg/dL  GLUCOSE, CAPILLARY     Status: Abnormal   Collection Time    08/26/12 10:33 PM      Result Value Range   Glucose-Capillary 122 (*) 70 - 99 mg/dL   Comment 1 Documented in Chart     Comment 2 Notify RN    HEMOGLOBIN A1C     Status: Abnormal   Collection Time    08/27/12  5:30 AM      Result Value Range   Hemoglobin A1C 5.9 (*) <5.7 %   Mean Plasma Glucose 123 (*) <117  mg/dL  LIPID PANEL     Status: Abnormal   Collection Time    08/27/12  5:30 AM      Result Value Range   Cholesterol 195  0 - 200 mg/dL   Triglycerides 161  <096 mg/dL   HDL 43  >04 mg/dL   Total CHOL/HDL Ratio 4.5     VLDL 24  0 - 40 mg/dL   LDL Cholesterol 540 (*) 0 - 99 mg/dL  GLUCOSE, CAPILLARY     Status: Abnormal   Collection Time    08/27/12  6:35 AM      Result Value Range   Glucose-Capillary 101 (*) 70 - 99 mg/dL   Comment 1 Documented in Chart     Comment 2 Notify RN     GLUCOSE, CAPILLARY     Status: None   Collection Time    08/27/12 11:16 AM      Result Value Range   Glucose-Capillary 85  70 - 99 mg/dL   Mr Shirlee Latch Wo Contrast  08/26/2012   *RADIOLOGY REPORT*  Clinical Data:  Two day history of difficulty walking and slurred speech.  Prior infarct.  Diabetic hypertensive patient.  MRI HEAD WITHOUT CONTRAST MRA HEAD AND NECK WITHOUT CONTRAST  Technique: Multiplanar, multiecho pulse sequences of the brain and surrounding structures were obtained according to standard protocol without intravenous contrast.  Angiographic images of the head were obtained using MRA technique without contrast.  Comparison: 07/06/2012 MR.  MRI HEAD  Findings:  Acute non hemorrhagic infarct at the junction of the right thalamus and posterior limb of the right internal capsule.  Remote moderate to large size infarct medial aspect left frontal and parietal lobe in the anterior indicating artery distribution with encephalomalacia and subsequent dilation of the left lateral ventricle. Wallerian degeneration.  Remote infarct left globus pallidus.  Remote left paracentral small infarct.  Tiny remote right cerebellar infarct.  Mild small vessel disease type changes.  Mild cervical spondylotic changes without cord compression. Cervical medullary junction, pituitary region, pineal region and orbital structures unremarkable.  No intracranial mass or abnormal enhancement.  IMPRESSION: Acute non hemorrhagic infarct at the junction of the right thalamus and posterior limb of the right internal capsule.  Remote infarcts as discussed above.  MRA HEAD  Findings: Cavernous segment internal carotid artery narrowing and irregularity more notable on the left.  Hypoplastic A1 segment right anterior cerebral artery with narrowing and irregularity suggesting superimposed atherosclerotic type changes.  Mild narrowing M1 segment right middle cerebral artery.  Middle cerebral artery branch vessel mild to moderate  irregularity narrowing.  Mild irregularity of the A2 segment of the anterior cerebral artery bilaterally.  Ectatic vertebral arteries and basilar artery.  The left vertebral artery is dominant. Very mild narrowing of the left vertebral artery after the takeoff of the left PICA. Mild narrowing distal right vertebral artery.  Irregularity and narrowing of the right PICA which is smaller than the left PICA.  Mild irregularity of the basilar artery without high-grade stenosis.  Nonvisualization AICA bilaterally.  Moderate narrowing the superior cerebellar artery bilaterally.  Mild narrowing proximal right posterior cerebral artery.  P2 segment the posterior cerebral artery mild to moderate irregularity greater on the right.  Posterior cerebral artery distal branch vessel narrowing irregularity bilaterally.  No aneurysm or vascular malformation noted.  IMPRESSION: Intracranial atherosclerotic type changes as detailed above.  MRA NECK WITHOUT CONTRAST  Technique: Angiographic images of the neck were obtained using MRA technique without intravenous contrast.  Carotid stenosis  measurements (when applicable) are obtained utilizing NASCET criteria, using the distal internal carotid diameter as the denominator.  Findings:  Normal configuration of the origin of the great vessels from the aortic arch.  Mild ectasia of the common carotid artery bilaterally.  Minimal irregularity carotid bifurcation without evidence of hemodynamically significant stenosis involving either carotid bifurcation.  Mild ectasia of the vertical cervical segment of the internal carotid artery bilaterally slightly greater on the right.  Left vertebral artery is dominant.  Ectatic proximal left vertebral artery with fold and mild narrowing.  Minimal narrowing proximal right vertebral artery.  IMPRESSION: No evidence hemodynamically significant stenosis involving either carotid bifurcation.  Left vertebral artery is dominant.  Ectatic proximal left  vertebral artery with fold and mild narrowing.  Minimal narrowing proximal right vertebral artery.  Critical Value/emergent results were called by telephone at the time of interpretation on 08/26/2012 at 1:27 p.m. to Dr. Hyacinth Meeker, who verbally acknowledged these results.   Original Report Authenticated By: Lacy Duverney, M.D.   Mr Angiogram Neck W Wo Contrast  08/26/2012   *RADIOLOGY REPORT*  Clinical Data:  Two day history of difficulty walking and slurred speech.  Prior infarct.  Diabetic hypertensive patient.  MRI HEAD WITHOUT CONTRAST MRA HEAD AND NECK WITHOUT CONTRAST  Technique: Multiplanar, multiecho pulse sequences of the brain and surrounding structures were obtained according to standard protocol without intravenous contrast.  Angiographic images of the head were obtained using MRA technique without contrast.  Comparison: 07/06/2012 MR.  MRI HEAD  Findings:  Acute non hemorrhagic infarct at the junction of the right thalamus and posterior limb of the right internal capsule.  Remote moderate to large size infarct medial aspect left frontal and parietal lobe in the anterior indicating artery distribution with encephalomalacia and subsequent dilation of the left lateral ventricle. Wallerian degeneration.  Remote infarct left globus pallidus.  Remote left paracentral small infarct.  Tiny remote right cerebellar infarct.  Mild small vessel disease type changes.  Mild cervical spondylotic changes without cord compression. Cervical medullary junction, pituitary region, pineal region and orbital structures unremarkable.  No intracranial mass or abnormal enhancement.  IMPRESSION: Acute non hemorrhagic infarct at the junction of the right thalamus and posterior limb of the right internal capsule.  Remote infarcts as discussed above.  MRA HEAD  Findings: Cavernous segment internal carotid artery narrowing and irregularity more notable on the left.  Hypoplastic A1 segment right anterior cerebral artery with narrowing  and irregularity suggesting superimposed atherosclerotic type changes.  Mild narrowing M1 segment right middle cerebral artery.  Middle cerebral artery branch vessel mild to moderate irregularity narrowing.  Mild irregularity of the A2 segment of the anterior cerebral artery bilaterally.  Ectatic vertebral arteries and basilar artery.  The left vertebral artery is dominant. Very mild narrowing of the left vertebral artery after the takeoff of the left PICA. Mild narrowing distal right vertebral artery.  Irregularity and narrowing of the right PICA which is smaller than the left PICA.  Mild irregularity of the basilar artery without high-grade stenosis.  Nonvisualization AICA bilaterally.  Moderate narrowing the superior cerebellar artery bilaterally.  Mild narrowing proximal right posterior cerebral artery.  P2 segment the posterior cerebral artery mild to moderate irregularity greater on the right.  Posterior cerebral artery distal branch vessel narrowing irregularity bilaterally.  No aneurysm or vascular malformation noted.  IMPRESSION: Intracranial atherosclerotic type changes as detailed above.  MRA NECK WITHOUT CONTRAST  Technique: Angiographic images of the neck were obtained using MRA technique without intravenous contrast.  Carotid stenosis measurements (when applicable) are obtained utilizing NASCET criteria, using the distal internal carotid diameter as the denominator.  Findings:  Normal configuration of the origin of the great vessels from the aortic arch.  Mild ectasia of the common carotid artery bilaterally.  Minimal irregularity carotid bifurcation without evidence of hemodynamically significant stenosis involving either carotid bifurcation.  Mild ectasia of the vertical cervical segment of the internal carotid artery bilaterally slightly greater on the right.  Left vertebral artery is dominant.  Ectatic proximal left vertebral artery with fold and mild narrowing.  Minimal narrowing proximal right  vertebral artery.  IMPRESSION: No evidence hemodynamically significant stenosis involving either carotid bifurcation.  Left vertebral artery is dominant.  Ectatic proximal left vertebral artery with fold and mild narrowing.  Minimal narrowing proximal right vertebral artery.  Critical Value/emergent results were called by telephone at the time of interpretation on 08/26/2012 at 1:27 p.m. to Dr. Hyacinth Meeker, who verbally acknowledged these results.   Original Report Authenticated By: Lacy Duverney, M.D.   Mr Laqueta Jean Wo Contrast  08/26/2012   *RADIOLOGY REPORT*  Clinical Data:  Two day history of difficulty walking and slurred speech.  Prior infarct.  Diabetic hypertensive patient.  MRI HEAD WITHOUT CONTRAST MRA HEAD AND NECK WITHOUT CONTRAST  Technique: Multiplanar, multiecho pulse sequences of the brain and surrounding structures were obtained according to standard protocol without intravenous contrast.  Angiographic images of the head were obtained using MRA technique without contrast.  Comparison: 07/06/2012 MR.  MRI HEAD  Findings:  Acute non hemorrhagic infarct at the junction of the right thalamus and posterior limb of the right internal capsule.  Remote moderate to large size infarct medial aspect left frontal and parietal lobe in the anterior indicating artery distribution with encephalomalacia and subsequent dilation of the left lateral ventricle. Wallerian degeneration.  Remote infarct left globus pallidus.  Remote left paracentral small infarct.  Tiny remote right cerebellar infarct.  Mild small vessel disease type changes.  Mild cervical spondylotic changes without cord compression. Cervical medullary junction, pituitary region, pineal region and orbital structures unremarkable.  No intracranial mass or abnormal enhancement.  IMPRESSION: Acute non hemorrhagic infarct at the junction of the right thalamus and posterior limb of the right internal capsule.  Remote infarcts as discussed above.  MRA HEAD   Findings: Cavernous segment internal carotid artery narrowing and irregularity more notable on the left.  Hypoplastic A1 segment right anterior cerebral artery with narrowing and irregularity suggesting superimposed atherosclerotic type changes.  Mild narrowing M1 segment right middle cerebral artery.  Middle cerebral artery branch vessel mild to moderate irregularity narrowing.  Mild irregularity of the A2 segment of the anterior cerebral artery bilaterally.  Ectatic vertebral arteries and basilar artery.  The left vertebral artery is dominant. Very mild narrowing of the left vertebral artery after the takeoff of the left PICA. Mild narrowing distal right vertebral artery.  Irregularity and narrowing of the right PICA which is smaller than the left PICA.  Mild irregularity of the basilar artery without high-grade stenosis.  Nonvisualization AICA bilaterally.  Moderate narrowing the superior cerebellar artery bilaterally.  Mild narrowing proximal right posterior cerebral artery.  P2 segment the posterior cerebral artery mild to moderate irregularity greater on the right.  Posterior cerebral artery distal branch vessel narrowing irregularity bilaterally.  No aneurysm or vascular malformation noted.  IMPRESSION: Intracranial atherosclerotic type changes as detailed above.  MRA NECK WITHOUT CONTRAST  Technique: Angiographic images of the neck were obtained using MRA technique without intravenous contrast.  Carotid stenosis measurements (when applicable) are obtained utilizing NASCET criteria, using the distal internal carotid diameter as the denominator.  Findings:  Normal configuration of the origin of the great vessels from the aortic arch.  Mild ectasia of the common carotid artery bilaterally.  Minimal irregularity carotid bifurcation without evidence of hemodynamically significant stenosis involving either carotid bifurcation.  Mild ectasia of the vertical cervical segment of the internal carotid artery  bilaterally slightly greater on the right.  Left vertebral artery is dominant.  Ectatic proximal left vertebral artery with fold and mild narrowing.  Minimal narrowing proximal right vertebral artery.  IMPRESSION: No evidence hemodynamically significant stenosis involving either carotid bifurcation.  Left vertebral artery is dominant.  Ectatic proximal left vertebral artery with fold and mild narrowing.  Minimal narrowing proximal right vertebral artery.  Critical Value/emergent results were called by telephone at the time of interpretation on 08/26/2012 at 1:27 p.m. to Dr. Hyacinth Meeker, who verbally acknowledged these results.   Original Report Authenticated By: Lacy Duverney, M.D.    Assessment/Plan: Diagnosis: right thalamic into internal capsule infarct 1. Does the need for close, 24 hr/day medical supervision in concert with the patient's rehab needs make it unreasonable for this patient to be served in a less intensive setting? Yes 2. Co-Morbidities requiring supervision/potential complications: dm, htn 3. Due to bladder management, bowel management, safety, skin/wound care, disease management, medication administration, pain management and patient education, does the patient require 24 hr/day rehab nursing? Yes 4. Does the patient require coordinated care of a physician, rehab nurse, PT (1-2 hrs/day, 5 days/week), OT (1-2 hrs/day, 5 days/week) and SLP (1-2 hrs/day, 5 days/week) to address physical and functional deficits in the context of the above medical diagnosis(es)? Yes Addressing deficits in the following areas: balance, endurance, locomotion, strength, transferring, bowel/bladder control, bathing, dressing, feeding, grooming, toileting, cognition and psychosocial support 5. Can the patient actively participate in an intensive therapy program of at least 3 hrs of therapy per day at least 5 days per week? Yes 6. The potential for patient to make measurable gains while on inpatient rehab is  excellent 7. Anticipated functional outcomes upon discharge from inpatient rehab are supervision with PT, supervision with OT, supervision with SLP. 8. Estimated rehab length of stay to reach the above functional goals is: 12-14 days 9. Does the patient have adequate social supports to accommodate these discharge functional goals? Yes 10. Anticipated D/C setting: Home 11. Anticipated post D/C treatments: HH therapy 12. Overall Rehab/Functional Prognosis: excellent  RECOMMENDATIONS: This patient's condition is appropriate for continued rehabilitative care in the following setting: CIR Patient has agreed to participate in recommended program. Yes Note that insurance prior authorization may be required for reimbursement for recommended care.  Comment:Rehab RN to follow up.   Ranelle Oyster, MD, [place] [facility]     08/27/2012

## 2012-08-28 DIAGNOSIS — E785 Hyperlipidemia, unspecified: Secondary | ICD-10-CM

## 2012-08-28 LAB — GLUCOSE, CAPILLARY: Glucose-Capillary: 87 mg/dL (ref 70–99)

## 2012-08-28 MED ORDER — ZOLPIDEM TARTRATE 5 MG PO TABS
5.0000 mg | ORAL_TABLET | Freq: Every evening | ORAL | Status: DC | PRN
  Administered 2012-08-28 – 2012-08-29 (×3): 5 mg via ORAL
  Filled 2012-08-28 (×3): qty 1

## 2012-08-28 NOTE — Progress Notes (Signed)
[facility] PROGRESS NOTE  Sophia Rodriguez ZOX:[id number] DOB: Feb 08, 1951 DOA: 08/26/2012 PCP: Dorrene German, MD  Assessment/Plan: 1. New internal capsule stroke - presented out of the window for Tpa - admited to neuro telemetry - changed antiplatelet therapy to aggrenox (was on plavix PTA)  changed statin to lipitor 40 mg daily. Await PT/OT/ST suggestions regarding rehab 2. DM - A1C 5.9 suggestive good control   3. HL - uncontrolled PTA - increased statin therapy during current admission   Code Status: full  Family Communication: patient  Disposition Plan: ? CIR   Consultants:  Neurology   Procedures:  MRI  HPI/Subjective: No new complaints   Objective: Filed Vitals:   08/27/12 1820 08/27/12 2138 08/28/12 0242 08/28/12 0536  BP: 145/99 134/81 134/83 150/86  Pulse: 81 82 81 87  Temp: 98.3 F (36.8 C) 98.1 F (36.7 C) 97.5 F (36.4 C) 97.8 F (36.6 C)  TempSrc: Oral Oral Oral Oral  Resp: 16 18 18 20   Height:      Weight:      SpO2: 100% 100% 98% 99%    Intake/Output Summary (Last 24 hours) at 08/28/12 0704 Last data filed at 08/27/12 0853  Gross per 24 hour  Intake      0 ml  Output      1 ml  Net     -1 ml   Filed Weights   08/26/12 1645 08/26/12 2020  Weight: 74.844 kg (165 lb) 74.844 kg (165 lb)    Exam:   General:  axox3  Cardiovascular: rrr  Respiratory: ctab   Abdomen: soft, nt   Data Reviewed: Basic Metabolic Panel:  Recent Labs Lab 08/26/12 1020 08/26/12 1052  NA 138 142  K 3.9 3.9  CL 103 106  CO2 25  --   GLUCOSE 104* 88  BUN 8 7  CREATININE 0.87 0.90  CALCIUM 9.9  --    Liver Function Tests:  Recent Labs Lab 08/26/12 1020  AST 12  ALT 8  ALKPHOS 77  BILITOT 0.4  PROT 7.8  ALBUMIN 3.9   No results found for this basename: LIPASE, AMYLASE,  in the last 168 hours No results found for this basename: AMMONIA,  in the last 168 hours CBC:  Recent Labs Lab 08/26/12 1020 08/26/12 1052  WBC 6.4  --   NEUTROABS  4.5  --   HGB 13.8 18.7*  HCT 41.0 55.0*  MCV 78.5  --   PLT 266  --    Cardiac Enzymes:  Recent Labs Lab 08/26/12 1020  TROPONINI <0.30   BNP (last 3 results) No results found for this basename: PROBNP,  in the last 8760 hours CBG:  Recent Labs Lab 08/27/12 0635 08/27/12 1116 08/27/12 1635 08/27/12 2140 08/28/12 0646  GLUCAP 101* 85 122* 137* 119*    No results found for this or any previous visit (from the past 240 hour(s)).   Studies: Mr Shirlee Latch WJ Contrast  08/26/2012   *RADIOLOGY REPORT*  Clinical Data:  Two day history of difficulty walking and slurred speech.  Prior infarct.  Diabetic hypertensive patient.  MRI HEAD WITHOUT CONTRAST MRA HEAD AND NECK WITHOUT CONTRAST  Technique: Multiplanar, multiecho pulse sequences of the brain and surrounding structures were obtained according to standard protocol without intravenous contrast.  Angiographic images of the head were obtained using MRA technique without contrast.  Comparison: 07/06/2012 MR.  MRI HEAD  Findings:  Acute non hemorrhagic infarct at the junction of the right thalamus and posterior limb of the  right internal capsule.  Remote moderate to large size infarct medial aspect left frontal and parietal lobe in the anterior indicating artery distribution with encephalomalacia and subsequent dilation of the left lateral ventricle. Wallerian degeneration.  Remote infarct left globus pallidus.  Remote left paracentral small infarct.  Tiny remote right cerebellar infarct.  Mild small vessel disease type changes.  Mild cervical spondylotic changes without cord compression. Cervical medullary junction, pituitary region, pineal region and orbital structures unremarkable.  No intracranial mass or abnormal enhancement.  IMPRESSION: Acute non hemorrhagic infarct at the junction of the right thalamus and posterior limb of the right internal capsule.  Remote infarcts as discussed above.  MRA HEAD  Findings: Cavernous segment internal  carotid artery narrowing and irregularity more notable on the left.  Hypoplastic A1 segment right anterior cerebral artery with narrowing and irregularity suggesting superimposed atherosclerotic type changes.  Mild narrowing M1 segment right middle cerebral artery.  Middle cerebral artery branch vessel mild to moderate irregularity narrowing.  Mild irregularity of the A2 segment of the anterior cerebral artery bilaterally.  Ectatic vertebral arteries and basilar artery.  The left vertebral artery is dominant. Very mild narrowing of the left vertebral artery after the takeoff of the left PICA. Mild narrowing distal right vertebral artery.  Irregularity and narrowing of the right PICA which is smaller than the left PICA.  Mild irregularity of the basilar artery without high-grade stenosis.  Nonvisualization AICA bilaterally.  Moderate narrowing the superior cerebellar artery bilaterally.  Mild narrowing proximal right posterior cerebral artery.  P2 segment the posterior cerebral artery mild to moderate irregularity greater on the right.  Posterior cerebral artery distal branch vessel narrowing irregularity bilaterally.  No aneurysm or vascular malformation noted.  IMPRESSION: Intracranial atherosclerotic type changes as detailed above.  MRA NECK WITHOUT CONTRAST  Technique: Angiographic images of the neck were obtained using MRA technique without intravenous contrast.  Carotid stenosis measurements (when applicable) are obtained utilizing NASCET criteria, using the distal internal carotid diameter as the denominator.  Findings:  Normal configuration of the origin of the great vessels from the aortic arch.  Mild ectasia of the common carotid artery bilaterally.  Minimal irregularity carotid bifurcation without evidence of hemodynamically significant stenosis involving either carotid bifurcation.  Mild ectasia of the vertical cervical segment of the internal carotid artery bilaterally slightly greater on the right.   Left vertebral artery is dominant.  Ectatic proximal left vertebral artery with fold and mild narrowing.  Minimal narrowing proximal right vertebral artery.  IMPRESSION: No evidence hemodynamically significant stenosis involving either carotid bifurcation.  Left vertebral artery is dominant.  Ectatic proximal left vertebral artery with fold and mild narrowing.  Minimal narrowing proximal right vertebral artery.  Critical Value/emergent results were called by telephone at the time of interpretation on 08/26/2012 at 1:27 p.m. to Dr. Hyacinth Meeker, who verbally acknowledged these results.   Original Report Authenticated By: Lacy Duverney, M.D.   Mr Angiogram Neck W Wo Contrast  08/26/2012   *RADIOLOGY REPORT*  Clinical Data:  Two day history of difficulty walking and slurred speech.  Prior infarct.  Diabetic hypertensive patient.  MRI HEAD WITHOUT CONTRAST MRA HEAD AND NECK WITHOUT CONTRAST  Technique: Multiplanar, multiecho pulse sequences of the brain and surrounding structures were obtained according to standard protocol without intravenous contrast.  Angiographic images of the head were obtained using MRA technique without contrast.  Comparison: 07/06/2012 MR.  MRI HEAD  Findings:  Acute non hemorrhagic infarct at the junction of the right thalamus and posterior limb  of the right internal capsule.  Remote moderate to large size infarct medial aspect left frontal and parietal lobe in the anterior indicating artery distribution with encephalomalacia and subsequent dilation of the left lateral ventricle. Wallerian degeneration.  Remote infarct left globus pallidus.  Remote left paracentral small infarct.  Tiny remote right cerebellar infarct.  Mild small vessel disease type changes.  Mild cervical spondylotic changes without cord compression. Cervical medullary junction, pituitary region, pineal region and orbital structures unremarkable.  No intracranial mass or abnormal enhancement.  IMPRESSION: Acute non hemorrhagic  infarct at the junction of the right thalamus and posterior limb of the right internal capsule.  Remote infarcts as discussed above.  MRA HEAD  Findings: Cavernous segment internal carotid artery narrowing and irregularity more notable on the left.  Hypoplastic A1 segment right anterior cerebral artery with narrowing and irregularity suggesting superimposed atherosclerotic type changes.  Mild narrowing M1 segment right middle cerebral artery.  Middle cerebral artery branch vessel mild to moderate irregularity narrowing.  Mild irregularity of the A2 segment of the anterior cerebral artery bilaterally.  Ectatic vertebral arteries and basilar artery.  The left vertebral artery is dominant. Very mild narrowing of the left vertebral artery after the takeoff of the left PICA. Mild narrowing distal right vertebral artery.  Irregularity and narrowing of the right PICA which is smaller than the left PICA.  Mild irregularity of the basilar artery without high-grade stenosis.  Nonvisualization AICA bilaterally.  Moderate narrowing the superior cerebellar artery bilaterally.  Mild narrowing proximal right posterior cerebral artery.  P2 segment the posterior cerebral artery mild to moderate irregularity greater on the right.  Posterior cerebral artery distal branch vessel narrowing irregularity bilaterally.  No aneurysm or vascular malformation noted.  IMPRESSION: Intracranial atherosclerotic type changes as detailed above.  MRA NECK WITHOUT CONTRAST  Technique: Angiographic images of the neck were obtained using MRA technique without intravenous contrast.  Carotid stenosis measurements (when applicable) are obtained utilizing NASCET criteria, using the distal internal carotid diameter as the denominator.  Findings:  Normal configuration of the origin of the great vessels from the aortic arch.  Mild ectasia of the common carotid artery bilaterally.  Minimal irregularity carotid bifurcation without evidence of hemodynamically  significant stenosis involving either carotid bifurcation.  Mild ectasia of the vertical cervical segment of the internal carotid artery bilaterally slightly greater on the right.  Left vertebral artery is dominant.  Ectatic proximal left vertebral artery with fold and mild narrowing.  Minimal narrowing proximal right vertebral artery.  IMPRESSION: No evidence hemodynamically significant stenosis involving either carotid bifurcation.  Left vertebral artery is dominant.  Ectatic proximal left vertebral artery with fold and mild narrowing.  Minimal narrowing proximal right vertebral artery.  Critical Value/emergent results were called by telephone at the time of interpretation on 08/26/2012 at 1:27 p.m. to Dr. Hyacinth Meeker, who verbally acknowledged these results.   Original Report Authenticated By: Lacy Duverney, M.D.   Mr Laqueta Jean Wo Contrast  08/26/2012   *RADIOLOGY REPORT*  Clinical Data:  Two day history of difficulty walking and slurred speech.  Prior infarct.  Diabetic hypertensive patient.  MRI HEAD WITHOUT CONTRAST MRA HEAD AND NECK WITHOUT CONTRAST  Technique: Multiplanar, multiecho pulse sequences of the brain and surrounding structures were obtained according to standard protocol without intravenous contrast.  Angiographic images of the head were obtained using MRA technique without contrast.  Comparison: 07/06/2012 MR.  MRI HEAD  Findings:  Acute non hemorrhagic infarct at the junction of the right thalamus and posterior  limb of the right internal capsule.  Remote moderate to large size infarct medial aspect left frontal and parietal lobe in the anterior indicating artery distribution with encephalomalacia and subsequent dilation of the left lateral ventricle. Wallerian degeneration.  Remote infarct left globus pallidus.  Remote left paracentral small infarct.  Tiny remote right cerebellar infarct.  Mild small vessel disease type changes.  Mild cervical spondylotic changes without cord compression. Cervical  medullary junction, pituitary region, pineal region and orbital structures unremarkable.  No intracranial mass or abnormal enhancement.  IMPRESSION: Acute non hemorrhagic infarct at the junction of the right thalamus and posterior limb of the right internal capsule.  Remote infarcts as discussed above.  MRA HEAD  Findings: Cavernous segment internal carotid artery narrowing and irregularity more notable on the left.  Hypoplastic A1 segment right anterior cerebral artery with narrowing and irregularity suggesting superimposed atherosclerotic type changes.  Mild narrowing M1 segment right middle cerebral artery.  Middle cerebral artery branch vessel mild to moderate irregularity narrowing.  Mild irregularity of the A2 segment of the anterior cerebral artery bilaterally.  Ectatic vertebral arteries and basilar artery.  The left vertebral artery is dominant. Very mild narrowing of the left vertebral artery after the takeoff of the left PICA. Mild narrowing distal right vertebral artery.  Irregularity and narrowing of the right PICA which is smaller than the left PICA.  Mild irregularity of the basilar artery without high-grade stenosis.  Nonvisualization AICA bilaterally.  Moderate narrowing the superior cerebellar artery bilaterally.  Mild narrowing proximal right posterior cerebral artery.  P2 segment the posterior cerebral artery mild to moderate irregularity greater on the right.  Posterior cerebral artery distal branch vessel narrowing irregularity bilaterally.  No aneurysm or vascular malformation noted.  IMPRESSION: Intracranial atherosclerotic type changes as detailed above.  MRA NECK WITHOUT CONTRAST  Technique: Angiographic images of the neck were obtained using MRA technique without intravenous contrast.  Carotid stenosis measurements (when applicable) are obtained utilizing NASCET criteria, using the distal internal carotid diameter as the denominator.  Findings:  Normal configuration of the origin of the  great vessels from the aortic arch.  Mild ectasia of the common carotid artery bilaterally.  Minimal irregularity carotid bifurcation without evidence of hemodynamically significant stenosis involving either carotid bifurcation.  Mild ectasia of the vertical cervical segment of the internal carotid artery bilaterally slightly greater on the right.  Left vertebral artery is dominant.  Ectatic proximal left vertebral artery with fold and mild narrowing.  Minimal narrowing proximal right vertebral artery.  IMPRESSION: No evidence hemodynamically significant stenosis involving either carotid bifurcation.  Left vertebral artery is dominant.  Ectatic proximal left vertebral artery with fold and mild narrowing.  Minimal narrowing proximal right vertebral artery.  Critical Value/emergent results were called by telephone at the time of interpretation on 08/26/2012 at 1:27 p.m. to Dr. Hyacinth Meeker, who verbally acknowledged these results.   Original Report Authenticated By: Lacy Duverney, M.D.    Scheduled Meds: . atorvastatin  40 mg Oral q1800  . citalopram  10 mg Oral Daily  . dipyridamole-aspirin  1 capsule Oral BID  . enoxaparin (LOVENOX) injection  40 mg Subcutaneous Q24H  . insulin aspart  0-9 Units Subcutaneous TID WC   Continuous Infusions:   Principal Problem:   Stroke Active Problems:   Hypertension   DM type 2 causing neurological disease        Sophia Rodriguez  [facility] Pager [phone]. If 7PM-7AM, please contact night-coverage at [email or web address], password [facility] 08/28/2012, 7:04 AM  LOS: 2  days

## 2012-08-28 NOTE — Progress Notes (Signed)
Stroke Team Progress Note  HISTORY Sophia Rodriguez is a 62 y.o. 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  She reported that she did recover very well from her prior strokes and was quite functional. However, on 08/25/2012  she couldn't get up from bed and when she was finally able to do so she had a great deal of difficulty moving around. She stated that she felt that the right side of her body got weaker and her balance was off. Then, the next day she felt weak and family decided to bring her to the hospital.  Sophia Rodriguez denies associated headache, vertigo, double vision, difficulty swallowing, slurred speech, language or vision impairment.  She has been taking plavix consistently.  MRI-DWI performed at [facility] 08/26/2012 revealed an acute lacunar infarct involving the right thalamus-posterior limb IC.  MRA neck showed no hemodynamically significant carotid disease.  MRA brain demonstrated mildly diffuse intracranial atherosclerotic disease and ectatic vertebral arteries and basilar arteries.  She was switched from plavix to aggrenox at the time of this admission.   Date last known well: 08/25/12  Time last known well: uncertain  tPA Given: no, late presentation.  SUBJECTIVE There no family members present this morning. The patient is in bed and in no acute distress although she has a very flat affect and is somewhat slow to respond.  OBJECTIVE Most recent Vital Signs: Filed Vitals:   08/27/12 1820 08/27/12 2138 08/28/12 0242 08/28/12 0536  BP: 145/99 134/81 134/83 150/86  Pulse: 81 82 81 87  Temp: 98.3 F (36.8 C) 98.1 F (36.7 C) 97.5 F (36.4 C) 97.8 F (36.6 C)  TempSrc: Oral Oral Oral Oral  Resp: 16 18 18 20   Height:      Weight:      SpO2: 100% 100% 98% 99%   CBG (last  3)   Recent Labs  08/27/12 1635 08/27/12 2140 08/28/12 0646  GLUCAP 122* 137* 119*    IV Fluid Intake:     MEDICATIONS  . atorvastatin  40 mg Oral q1800  . citalopram  10 mg Oral Daily  . dipyridamole-aspirin  1 capsule Oral BID  . enoxaparin (LOVENOX) injection  40 mg Subcutaneous Q24H  . insulin aspart  0-9 Units Subcutaneous TID WC   PRN:  acetaminophen, acetaminophen, clonazePAM, zolpidem  Diet:  Cardiac thin liquids Activity:  Up with assistance DVT Prophylaxis:  Lovenox  CLINICALLY SIGNIFICANT STUDIES Basic Metabolic Panel:  Recent Labs Lab 08/26/12 1020 08/26/12 1052  NA 138 142  K 3.9 3.9  CL 103 106  CO2 25  --   GLUCOSE 104* 88  BUN 8 7  CREATININE 0.87 0.90  CALCIUM 9.9  --    Liver Function Tests:  Recent Labs Lab 08/26/12 1020  AST 12  ALT 8  ALKPHOS 77  BILITOT 0.4  PROT 7.8  ALBUMIN 3.9   CBC:  Recent Labs Lab 08/26/12 1020 08/26/12 1052  WBC 6.4  --   NEUTROABS 4.5  --   HGB 13.8 18.7*  HCT 41.0 55.0*  MCV 78.5  --   PLT 266  --    Coagulation:  Recent Labs Lab 08/26/12 1020  LABPROT 13.7  INR 1.06   Cardiac Enzymes:  Recent Labs Lab 08/26/12 1020  TROPONINI <0.30   Urinalysis:  Recent Labs Lab 08/26/12 1106  COLORURINE YELLOW  LABSPEC 1.017  PHURINE 6.0  GLUCOSEU NEGATIVE  HGBUR NEGATIVE  BILIRUBINUR SMALL*  KETONESUR 40*  PROTEINUR NEGATIVE  UROBILINOGEN 1.0  NITRITE NEGATIVE  LEUKOCYTESUR NEGATIVE   Lipid Panel    Component Value Date/Time   CHOL 195 08/27/2012 0530   TRIG 120 08/27/2012 0530   HDL 43 08/27/2012 0530   CHOLHDL 4.5 08/27/2012 0530   VLDL 24 08/27/2012 0530   LDLCALC 128* 08/27/2012 0530   HgbA1C  Lab Results  Component Value Date   HGBA1C 5.9* 08/27/2012    Urine Drug Screen:   No results found for this basename: labopia, cocainscrnur, labbenz, amphetmu, thcu, labbarb    Alcohol Level:  Recent Labs Lab 08/26/12 1020  ETH <11    MRI Head Wo Contrast  08/26/2012  Acute non  hemorrhagic infarct at the junction of the right thalamus and posterior limb of the right internal capsule.  Remote infarcts noted.    MRA HEAD  Findings: Cavernous segment internal carotid artery narrowing and irregularity more notable on the left.  Hypoplastic A1 segment right anterior cerebral artery with narrowing and irregularity suggesting superimposed atherosclerotic type changes.  Mild narrowing M1 segment right middle cerebral artery.  Middle cerebral artery branch vessel mild to moderate irregularity narrowing.  Mild irregularity of the A2 segment of the anterior cerebral artery bilaterally.  Ectatic vertebral arteries and basilar artery.  The left vertebral artery is dominant. Very mild narrowing of the left vertebral artery after the takeoff of the left PICA. Mild narrowing distal right vertebral artery.  Irregularity and narrowing of the right PICA which is smaller than the left PICA.  Mild irregularity of the basilar artery without high-grade stenosis.  Nonvisualization AICA bilaterally.  Moderate narrowing the superior cerebellar artery bilaterally.  Mild narrowing proximal right posterior cerebral artery.  P2 segment the posterior cerebral artery mild to moderate irregularity greater on the right.  Posterior cerebral artery distal branch vessel narrowing irregularity bilaterally.  No aneurysm or vascular malformation noted. IMPRESSION: Intracranial atherosclerotic type changes as detailed above.     MRA NECK WITHOUT CONTRAST   No evidence hemodynamically significant stenosis involving either carotid bifurcation.  Left vertebral artery is dominant.  Ectatic proximal left vertebral artery with fold and mild narrowing.  Minimal narrowing proximal right vertebral artery.  2D Echocardiogram  ejection fraction 60-65%. No obvious source of cardiac emboli noted.  Carotid Doppler  - see MRA of neck  CXR    EKG normal sinus rhythm rate 92 beats per minute  Therapy Recommendations inpatient  rehabilitation versus skilled nursing facility placement recommended.  Physical Exam   General - 62 year-old female in bed in no acute distress. Flat affect. Heart - Regular rate and rhythm - no murmer Lungs - Clear to auscultation Abdomen - Soft - non tender Extremities - Distal pulses intact - no edema Skin - Warm and dry  NEUROLOGIC:   MENTAL STATUS: awake, oriented, language fluent,  follows simple commands. Flat affect and somewhat slow to respond. CRANIAL NERVES: pupils equal and reactive to light,extraocular muscles intact, mild left lower face weakness, uvula midlinec, tongue midline MOTOR: normal bulk and tone, Strength -5 over 5 except the left upper extremity which is 4/5.weakness left grip. Orbits right over left upper extremity. Diminished fine finger movements on left SENSORY: normal and symmetric to light touch  COORDINATION: finger-nose-finger with mild difficulty on the left secondary to weakness.  ASSESSMENT Sophia Rodriguez is a 62 y.o. female presenting with left upper extremity paresis  and balance problems. T-PA was not used secondary to late presentation. Imaging confirms an acute non hemorrhagic infarct at the junction of the right thalamus and posterior limb of the right internal capsule.  Remote infarcts noted as well. Infarct felt to be thrombotic secondary to small vessel disease.  On clopidogrel 75 mg orally every day prior to admission. Now on dipyridamole SR 250 mg/aspirin 25 mg orally twice a day for secondary stroke prevention. Patient with resultant left upper extremity paresis. Work up completed.   Inpatient rehabilitation here at [facility] is planned.  Diabetes mellitus - hemoglobin A1c 5.9  Hyperlipidemia - LDL 128 -Zocor prior to admission. Currently on Lipitor.  Hypertension history  Previous CVAs  Hospital day # 2  TREATMENT/PLAN  Continue dipyridamole SR 250 mg/aspirin 25 mg orally twice a day for secondary stroke  prevention.  Inpatient rehabilitation.  Delton See PA-C [facility] Pager [phone] 08/28/2012, 9:34 AM  I have personally obtained a history, examined the patient, evaluated imaging results, and formulated the assessment and plan of care. I agree with the above. Delia Heady, MD

## 2012-08-29 LAB — GLUCOSE, CAPILLARY
Glucose-Capillary: 101 mg/dL — ABNORMAL HIGH (ref 70–99)
Glucose-Capillary: 116 mg/dL — ABNORMAL HIGH (ref 70–99)
Glucose-Capillary: 99 mg/dL (ref 70–99)

## 2012-08-29 NOTE — Progress Notes (Signed)
[facility] PROGRESS NOTE  Conley Pawling ZOX:[id number] DOB: [birth year] DOA: 08/26/2012 PCP: Dorrene German, MD  Assessment/Plan: 1. New internal capsule stroke - presented out of the window for Tpa - admited to neuro telemetry - changed antiplatelet therapy to aggrenox (was on plavix PTA)  changed statin to lipitor 40 mg daily.  2. DM - A1C 5.9 suggestive good control   3. HL - uncontrolled PTA - Changed statin therapy during current admission From Zocor to Lipitor  Code Status: full  Family Communication: patient  Disposition Plan: ? CIR   Consultants:  Neurology   Procedures:  MRI  HPI/Subjective: No new complaints   Objective: Filed Vitals:   08/29/12 0211 08/29/12 0549 08/29/12 1128 08/29/12 1312  BP: 131/85 150/91 130/89 128/78  Pulse: 70 98 87 84  Temp: 98 F (36.7 C) 97.9 F (36.6 C) 97.8 F (36.6 C) 97.8 F (36.6 C)  TempSrc: Oral Oral Oral Oral  Resp: 18 16 17 17   Height:      Weight:      SpO2: 100% 99% 100% 99%   No intake or output data in the 24 hours ending 08/29/12 1654 Filed Weights   08/26/12 1645 08/26/12 2020  Weight: 74.844 kg (165 lb) 74.844 kg (165 lb)    Exam:   General:  axox3  Cardiovascular: rrr  Respiratory: ctab   Abdomen: soft, nt   Data Reviewed: Basic Metabolic Panel:  Recent Labs Lab 08/26/12 1020 08/26/12 1052  NA 138 142  K 3.9 3.9  CL 103 106  CO2 25  --   GLUCOSE 104* 88  BUN 8 7  CREATININE 0.87 0.90  CALCIUM 9.9  --    Liver Function Tests:  Recent Labs Lab 08/26/12 1020  AST 12  ALT 8  ALKPHOS 77  BILITOT 0.4  PROT 7.8  ALBUMIN 3.9   No results found for this basename: LIPASE, AMYLASE,  in the last 168 hours No results found for this basename: AMMONIA,  in the last 168 hours CBC:  Recent Labs Lab 08/26/12 1020 08/26/12 1052  WBC 6.4  --   NEUTROABS 4.5  --   HGB 13.8 18.7*  HCT 41.0 55.0*  MCV 78.5  --   PLT 266  --    Cardiac Enzymes:  Recent Labs Lab 08/26/12 1020   TROPONINI <0.30   BNP (last 3 results) No results found for this basename: PROBNP,  in the last 8760 hours CBG:  Recent Labs Lab 08/28/12 1153 08/28/12 1654 08/28/12 2215 08/29/12 0653 08/29/12 1127  GLUCAP 87 144* 101* 116* 108*    No results found for this or any previous visit (from the past 240 hour(s)).   Studies: No results found.  Scheduled Meds: . atorvastatin  40 mg Oral q1800  . citalopram  10 mg Oral Daily  . dipyridamole-aspirin  1 capsule Oral BID  . enoxaparin (LOVENOX) injection  40 mg Subcutaneous Q24H  . insulin aspart  0-9 Units Subcutaneous TID WC   Continuous Infusions:   Principal Problem:   Stroke Active Problems:   Hypertension   DM type 2 causing neurological disease        Ollen Rao  [facility] Pager [phone]. If 7PM-7AM, please contact night-coverage at [email or web address], password [facility] 08/29/2012, 4:54 PM  LOS: 3 days

## 2012-08-29 NOTE — Progress Notes (Signed)
Stroke Team Progress Note  HISTORY Sophia Rodriguez is a 62 y.o. 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  She reported that she did recover very well from her prior strokes and was quite functional. However, on 08/25/2012  she couldn't get up from bed and when she was finally able to do so she had a great deal of difficulty moving around. She stated that she felt that the right side of her body got weaker and her balance was off. Then, the next day she felt weak and family decided to bring her to the hospital.  [name] denies associated headache, vertigo, double vision, difficulty swallowing, slurred speech, language or vision impairment.  She had been taking plavix consistently.  MRI-DWI performed at [facility] 08/26/2012 revealed an acute lacunar infarct involving the right thalamus-posterior limb IC.  MRA neck showed no hemodynamically significant carotid disease.  MRA brain demonstrated mildly diffuse intracranial atherosclerotic disease and ectatic vertebral arteries and basilar arteries.  She was switched from plavix to aggrenox at the time of this admission.   Date last known well: 08/25/12  Time last known well: uncertain  tPA Given: no, late presentation.  SUBJECTIVE There no family members present this morning. The patient is without complaints. We briefly discussed her pending rehabilitation center admission.  OBJECTIVE Most recent Vital Signs: Filed Vitals:   08/28/12 1824 08/28/12 2207 08/29/12 0211 08/29/12 0549  BP: 131/77 149/87 131/85 150/91  Pulse: 73 63 70 98  Temp: 98.1 F (36.7 C) 97.7 F (36.5 C) 98 F (36.7 C) 97.9 F (36.6 C)  TempSrc: Oral Oral Oral Oral  Resp: 18 17 18 16   Height:      Weight:      SpO2: 100% 99% 100% 99%   CBG (last 3)   Recent  Labs  08/28/12 1654 08/28/12 2215 08/29/12 0653  GLUCAP 144* 101* 116*    IV Fluid Intake:     MEDICATIONS  . atorvastatin  40 mg Oral q1800  . citalopram  10 mg Oral Daily  . dipyridamole-aspirin  1 capsule Oral BID  . enoxaparin (LOVENOX) injection  40 mg Subcutaneous Q24H  . insulin aspart  0-9 Units Subcutaneous TID WC   PRN:  acetaminophen, acetaminophen, clonazePAM, zolpidem  Diet:  Cardiac thin liquids Activity:  Up with assistance DVT Prophylaxis:  Lovenox  CLINICALLY SIGNIFICANT STUDIES Basic Metabolic Panel:   Recent Labs Lab 08/26/12 1020 08/26/12 1052  NA 138 142  K 3.9 3.9  CL 103 106  CO2 25  --   GLUCOSE 104* 88  BUN 8 7  CREATININE 0.87 0.90  CALCIUM 9.9  --    Liver Function Tests:   Recent Labs Lab 08/26/12 1020  AST 12  ALT 8  ALKPHOS 77  BILITOT 0.4  PROT 7.8  ALBUMIN 3.9   CBC:   Recent Labs Lab 08/26/12 1020 08/26/12 1052  WBC 6.4  --   NEUTROABS 4.5  --   HGB 13.8 18.7*  HCT 41.0 55.0*  MCV 78.5  --   PLT 266  --    Coagulation:   Recent Labs Lab 08/26/12 1020  LABPROT 13.7  INR 1.06   Cardiac Enzymes:   Recent Labs Lab 08/26/12 1020  TROPONINI <0.30   Urinalysis:   Recent Labs Lab 08/26/12 1106  COLORURINE YELLOW  LABSPEC 1.017  PHURINE 6.0  GLUCOSEU NEGATIVE  HGBUR NEGATIVE  BILIRUBINUR SMALL*  KETONESUR 40*  PROTEINUR NEGATIVE  UROBILINOGEN 1.0  NITRITE NEGATIVE  LEUKOCYTESUR NEGATIVE   Lipid Panel    Component Value Date/Time   CHOL 195 08/27/2012 0530   TRIG 120 08/27/2012 0530   HDL 43 08/27/2012 0530   CHOLHDL 4.5 08/27/2012 0530   VLDL 24 08/27/2012 0530   LDLCALC 128* 08/27/2012 0530   HgbA1C  Lab Results  Component Value Date   HGBA1C 5.9* 08/27/2012    Urine Drug Screen:   No results found for this basename: labopia,  cocainscrnur,  labbenz,  amphetmu,  thcu,  labbarb    Alcohol Level:   Recent Labs Lab 08/26/12 1020  ETH <11   and a  MRI Head Wo Contrast  08/26/2012  Acute  non hemorrhagic infarct at the junction of the right thalamus and posterior limb of the right internal capsule.  Remote infarcts noted.    MRA HEAD  Findings: Cavernous segment internal carotid artery narrowing and irregularity more notable on the left.  Hypoplastic A1 segment right anterior cerebral artery with narrowing and irregularity suggesting superimposed atherosclerotic type changes.  Mild narrowing M1 segment right middle cerebral artery.  Middle cerebral artery branch vessel mild to moderate irregularity narrowing.  Mild irregularity of the A2 segment of the anterior cerebral artery bilaterally.  Ectatic vertebral arteries and basilar artery.  The left vertebral artery is dominant. Very mild narrowing of the left vertebral artery after the takeoff of the left PICA. Mild narrowing distal right vertebral artery.  Irregularity and narrowing of the right PICA which is smaller than the left PICA.  Mild irregularity of the basilar artery without high-grade stenosis.  Nonvisualization AICA bilaterally.  Moderate narrowing the superior cerebellar artery bilaterally.  Mild narrowing proximal right posterior cerebral artery.  P2 segment the posterior cerebral artery mild to moderate irregularity greater on the right.  Posterior cerebral artery distal branch vessel narrowing irregularity bilaterally.  No aneurysm or vascular malformation noted. IMPRESSION: Intracranial atherosclerotic type changes as detailed above.     MRA NECK WITHOUT CONTRAST   No evidence hemodynamically significant stenosis involving either carotid bifurcation.  Left vertebral artery is dominant.  Ectatic proximal left vertebral artery with fold and mild narrowing.  Minimal narrowing proximal right vertebral artery.  2D Echocardiogram  ejection fraction 60-65%. No obvious source of cardiac emboli noted.  Carotid Doppler  - see MRA of neck  CXR    EKG normal sinus rhythm rate 92 beats per minute  Therapy Recommendations  inpatient rehabilitation versus skilled nursing facility placement recommended.  Physical Exam   General - 62 year-old female in bed in no acute distress. in bed in no acute distress.The patient appears more alert today although still with somewhat of a flat affect. Heart - Regular rate and rhythm - no murmer Lungs - Clear to auscultation Abdomen - Soft - non tender Extremities - Distal pulses intact - no edema Skin - Warm and dry  NEUROLOGIC:   MENTAL STATUS: awake, oriented, language fluent,  follows simple commands. Flat affect and somewhat slow to respond. CRANIAL NERVES: pupils equal and reactive to light,extraocular muscles intact, mild left lower face weakness, uvula midlinec, tongue midline MOTOR: normal bulk and tone, Strength -5 over 5 except the left upper extremity which is 4/5.weakness left grip. Orbits right over left upper extremity. Diminished fine finger movements on left SENSORY: normal and symmetric to light touch  COORDINATION: finger-nose-finger with mild difficulty on the left secondary to weakness.  ASSESSMENT Sophia Rodriguez is a 62 y.o. female presenting with left upper extremity paresis and balance problems. T-PA was not used secondary to late presentation. Imaging confirms an acute non hemorrhagic infarct at the junction of the right thalamus and posterior limb of the right internal capsule.  Remote infarcts noted as well. Infarct felt to be thrombotic secondary to small vessel disease.  On clopidogrel 75 mg orally every day prior to admission. Now on dipyridamole SR 250 mg/aspirin 25 mg orally twice a day for secondary stroke prevention. Patient with resultant left upper extremity paresis. Work up completed.   Inpatient rehabilitation here at [facility] is planned.  Diabetes mellitus - hemoglobin A1c 5.9  Hyperlipidemia - LDL 128 -Zocor prior to admission. Currently on Lipitor.  Hypertension history  Remote CVAs with previous right hemiparesis.  Hospital day #  3  TREATMENT/PLAN  Continue dipyridamole SR 250 mg/aspirin 25 mg orally twice a day for secondary stroke prevention.  Inpatient rehabilitation.  Delton See PA-C [facility] Pager [phone] 08/29/2012, 8:01 AM  I have personally obtained a history, examined the patient, evaluated imaging results, and formulated the assessment and plan of care. I agree with the above.  Delia Heady, MD

## 2012-08-30 ENCOUNTER — Inpatient Hospital Stay (HOSPITAL_COMMUNITY)
Admission: RE | Admit: 2012-08-30 | Discharge: 2012-09-10 | DRG: 945 | Disposition: A | Discharge status: Home / Self Care | Payer: Medicare Other | Source: Intra-hospital | Attending: Physical Medicine & Rehabilitation | Admitting: Physical Medicine & Rehabilitation

## 2012-08-30 DIAGNOSIS — I639 Cerebral infarction, unspecified: Secondary | ICD-10-CM | POA: Diagnosis present

## 2012-08-30 DIAGNOSIS — G8190 Hemiplegia, unspecified affecting unspecified side: Secondary | ICD-10-CM

## 2012-08-30 DIAGNOSIS — E785 Hyperlipidemia, unspecified: Secondary | ICD-10-CM

## 2012-08-30 DIAGNOSIS — Z823 Family history of stroke: Secondary | ICD-10-CM

## 2012-08-30 DIAGNOSIS — Z79899 Other long term (current) drug therapy: Secondary | ICD-10-CM

## 2012-08-30 DIAGNOSIS — I10 Essential (primary) hypertension: Secondary | ICD-10-CM

## 2012-08-30 DIAGNOSIS — E1149 Type 2 diabetes mellitus with other diabetic neurological complication: Secondary | ICD-10-CM

## 2012-08-30 DIAGNOSIS — Z87891 Personal history of nicotine dependence: Secondary | ICD-10-CM

## 2012-08-30 DIAGNOSIS — Z602 Problems related to living alone: Secondary | ICD-10-CM

## 2012-08-30 DIAGNOSIS — Z5189 Encounter for other specified aftercare: Principal | ICD-10-CM

## 2012-08-30 DIAGNOSIS — I6330 Cerebral infarction due to thrombosis of unspecified cerebral artery: Secondary | ICD-10-CM

## 2012-08-30 DIAGNOSIS — R29898 Other symptoms and signs involving the musculoskeletal system: Secondary | ICD-10-CM

## 2012-08-30 DIAGNOSIS — F411 Generalized anxiety disorder: Secondary | ICD-10-CM

## 2012-08-30 DIAGNOSIS — M25539 Pain in unspecified wrist: Secondary | ICD-10-CM

## 2012-08-30 DIAGNOSIS — I69998 Other sequelae following unspecified cerebrovascular disease: Secondary | ICD-10-CM

## 2012-08-30 DIAGNOSIS — I6350 Cerebral infarction due to unspecified occlusion or stenosis of unspecified cerebral artery: Secondary | ICD-10-CM

## 2012-08-30 DIAGNOSIS — E1142 Type 2 diabetes mellitus with diabetic polyneuropathy: Secondary | ICD-10-CM

## 2012-08-30 DIAGNOSIS — G4700 Insomnia, unspecified: Secondary | ICD-10-CM

## 2012-08-30 DIAGNOSIS — G8110 Spastic hemiplegia affecting unspecified side: Secondary | ICD-10-CM

## 2012-08-30 LAB — GLUCOSE, CAPILLARY
Glucose-Capillary: 118 mg/dL — ABNORMAL HIGH (ref 70–99)
Glucose-Capillary: 96 mg/dL (ref 70–99)

## 2012-08-30 MED ORDER — PROCHLORPERAZINE 25 MG RE SUPP
12.5000 mg | Freq: Four times a day (QID) | RECTAL | Status: DC | PRN
  Filled 2012-08-30: qty 1

## 2012-08-30 MED ORDER — DICLOFENAC SODIUM 1 % TD GEL
2.0000 g | Freq: Four times a day (QID) | TRANSDERMAL | Status: DC
  Administered 2012-08-30 – 2012-09-10 (×34): 2 g via TOPICAL
  Filled 2012-08-30: qty 100

## 2012-08-30 MED ORDER — PROCHLORPERAZINE EDISYLATE 5 MG/ML IJ SOLN
5.0000 mg | Freq: Four times a day (QID) | INTRAMUSCULAR | Status: DC | PRN
  Filled 2012-08-30: qty 2

## 2012-08-30 MED ORDER — INSULIN ASPART 100 UNIT/ML ~~LOC~~ SOLN
0.0000 [IU] | Freq: Three times a day (TID) | SUBCUTANEOUS | Status: DC
  Administered 2012-08-31 – 2012-09-06 (×10): 1 [IU] via SUBCUTANEOUS
  Administered 2012-09-07: 2 [IU] via SUBCUTANEOUS
  Administered 2012-09-08 – 2012-09-09 (×3): 1 [IU] via SUBCUTANEOUS
  Administered 2012-09-10: 2 [IU] via SUBCUTANEOUS

## 2012-08-30 MED ORDER — ENOXAPARIN SODIUM 40 MG/0.4ML ~~LOC~~ SOLN
40.0000 mg | SUBCUTANEOUS | Status: DC
  Administered 2012-08-30 – 2012-09-09 (×11): 40 mg via SUBCUTANEOUS
  Filled 2012-08-30 (×12): qty 0.4

## 2012-08-30 MED ORDER — INSULIN ASPART 100 UNIT/ML ~~LOC~~ SOLN: 0.0000 [IU] | Freq: Three times a day (TID) | SUBCUTANEOUS | Status: DC

## 2012-08-30 MED ORDER — CLONAZEPAM 0.5 MG PO TABS: 0.5000 mg | ORAL_TABLET | Freq: Every day | ORAL | Status: DC | PRN

## 2012-08-30 MED ORDER — POLYETHYLENE GLYCOL 3350 17 G PO PACK
17.0000 g | PACK | Freq: Every day | ORAL | Status: DC | PRN
  Administered 2012-09-05: 17 g via ORAL
  Filled 2012-08-30 (×2): qty 1

## 2012-08-30 MED ORDER — GUAIFENESIN-DM 100-10 MG/5ML PO SYRP: 5.0000 mL | ORAL_SOLUTION | Freq: Four times a day (QID) | ORAL | Status: DC | PRN

## 2012-08-30 MED ORDER — PROCHLORPERAZINE MALEATE 5 MG PO TABS
5.0000 mg | ORAL_TABLET | Freq: Four times a day (QID) | ORAL | Status: DC | PRN
  Filled 2012-08-30: qty 2

## 2012-08-30 MED ORDER — LISINOPRIL 2.5 MG PO TABS
2.5000 mg | ORAL_TABLET | Freq: Every day | ORAL | Status: DC
  Administered 2012-08-30 – 2012-09-06 (×8): 2.5 mg via ORAL
  Filled 2012-08-30 (×10): qty 1

## 2012-08-30 MED ORDER — FLEET ENEMA 7-19 GM/118ML RE ENEM: 1.0000 | ENEMA | Freq: Once | RECTAL | Status: AC | PRN

## 2012-08-30 MED ORDER — ASPIRIN-DIPYRIDAMOLE ER 25-200 MG PO CP12
1.0000 | ORAL_CAPSULE | Freq: Two times a day (BID) | ORAL | Status: DC
  Administered 2012-08-30 – 2012-09-10 (×22): 1 via ORAL
  Filled 2012-08-30 (×24): qty 1

## 2012-08-30 MED ORDER — BISACODYL 10 MG RE SUPP: 10.0000 mg | Freq: Every day | RECTAL | Status: DC | PRN

## 2012-08-30 MED ORDER — ZOLPIDEM TARTRATE 5 MG PO TABS
5.0000 mg | ORAL_TABLET | Freq: Every evening | ORAL | Status: DC | PRN
  Administered 2012-08-30 – 2012-09-09 (×11): 5 mg via ORAL
  Filled 2012-08-30 (×11): qty 1

## 2012-08-30 MED ORDER — ASPIRIN-DIPYRIDAMOLE ER 25-200 MG PO CP12: 1.0000 | ORAL_CAPSULE | Freq: Two times a day (BID) | ORAL | Status: DC

## 2012-08-30 MED ORDER — ATORVASTATIN CALCIUM 40 MG PO TABS
40.0000 mg | ORAL_TABLET | Freq: Every day | ORAL | Status: DC
  Administered 2012-08-30 – 2012-09-09 (×10): 40 mg via ORAL
  Filled 2012-08-30 (×12): qty 1

## 2012-08-30 MED ORDER — CITALOPRAM HYDROBROMIDE 10 MG PO TABS
10.0000 mg | ORAL_TABLET | Freq: Every day | ORAL | Status: DC
  Administered 2012-08-31 – 2012-09-10 (×11): 10 mg via ORAL
  Filled 2012-08-30 (×12): qty 1

## 2012-08-30 MED ORDER — ATORVASTATIN CALCIUM 40 MG PO TABS: 40.0000 mg | ORAL_TABLET | Freq: Every day | ORAL | Status: DC

## 2012-08-30 MED ORDER — ALUM & MAG HYDROXIDE-SIMETH 200-200-20 MG/5ML PO SUSP: 30.0000 mL | ORAL | Status: DC | PRN

## 2012-08-30 MED ORDER — ACETAMINOPHEN 325 MG PO TABS
325.0000 mg | ORAL_TABLET | ORAL | Status: DC | PRN
  Administered 2012-09-03: 650 mg via ORAL
  Filled 2012-08-30: qty 2

## 2012-08-30 NOTE — Care Management Note (Signed)
    Page 1 of 1   08/30/2012     4:13:08 PM   CARE MANAGEMENT NOTE 08/30/2012  Patient:  Sophia Rodriguez, Sophia Rodriguez   Account Number:  [id number]  Date Initiated:  08/27/2012  Documentation initiated by:  Elmer Bales  Subjective/Objective Assessment:   Pt was admitted for stroke. Pt lives at home alone.     Action/Plan:   Will follow for discharge needs, PT/OT/ST eval.   Anticipated DC Date:  08/30/2012   Anticipated DC Plan:  IP REHAB FACILITY      DC Planning Services  CM consult      Choice offered to / List presented to:             Status of service:  Completed, signed off Medicare Important Message given?   (If response is "NO", the following Medicare IM given date fields will be blank) Date Medicare IM given:   Date Additional Medicare IM given:    Discharge Disposition:  IP REHAB FACILITY  Per UR Regulation:  Reviewed for med. necessity/level of care/duration of stay  If discussed at Long Length of Stay Meetings, dates discussed:    Comments:

## 2012-08-30 NOTE — Progress Notes (Signed)
Patient admitted to 4007 at 71. Alert and oriented x4, denies pain. Pt oriented to unit, rehab schedule, call bell system, and safety plan. Rehab stroke education packet given. Patient verbalized understanding. Hedy Camara

## 2012-08-30 NOTE — Progress Notes (Signed)
Admitting patient to CIR today. Met with patient as bedside to evaluate for and discuss inpatient rehab. Pt would benefit from CIR. Dr Lavera Guise reports pt is ready for d/c to CIR and pt is in agreement.  For questions, call [phone].

## 2012-08-30 NOTE — Discharge Summary (Signed)
Physician Discharge Summary  Sophia Rodriguez GMW:[id number] DOB: 1950/07/08 DOA: 08/26/2012  PCP: Dorrene German, MD  Admit date: 08/26/2012 Discharge date: 08/30/2012  Time spent: 35 minutes  Discharge Diagnoses:  Principal Problem:   Stroke Active Problems:   Hypertension   DM type 2 causing neurological disease   Discharge Condition: to CIR  Diet recommendation: heart healthy   Filed Weights   08/26/12 1645 08/26/12 2020  Weight: 74.844 kg (165 lb) 74.844 kg (165 lb)    History of present illness:  Sophia Rodriguez is a 62 y.o. female with history of strokes - first one in 2010 and a second one in 2011 Currently on Plavix who noticed that she was weaker and had difficulty moving around the house yesterday. MRI in the ED indicates a new Acute non hemorrhagic infarct at the junction of the  right thalamus and posterior limb of the right internal capsule. She reports that she felt that her right side was weaker today than usual. No speech or sensation changes.    Hospital Course:  1. New internal capsule stroke - presented out of the window for Tpa - admited to neuro telemetry - changed antiplatelet therapy to aggrenox (was on plavix PTA) changed statin to lipitor 40 mg daily. Evaluated by PT/OT/ST and transferred to CIR  2. DM - A1C 5.9 suggestive good control  3. HL - uncontrolled PTA - Changed statin therapy during current admission From Zocor to Lipitor      Procedures: MRA HEAD  Findings: Cavernous segment internal carotid artery narrowing and irregularity more notable on the left. Hypoplastic A1 segment right anterior cerebral artery with narrowing and irregularity suggesting superimposed atherosclerotic type changes. Mild narrowing M1 segment right middle cerebral artery. Middle cerebral artery branch vessel mild to moderate irregularity narrowing. Mild irregularity of the A2 segment of the anterior cerebral artery bilaterally. Ectatic vertebral arteries and basilar artery. The  left vertebral artery is dominant. Very mild narrowing of the left vertebral artery after the takeoff of the left PICA. Mild narrowing distal right vertebral artery. Irregularity and narrowing of the right PICA which is smaller than the left PICA. Mild irregularity of the basilar artery without high-grade stenosis. Nonvisualization AICA bilaterally. Moderate narrowing the superior cerebellar artery bilaterally. Mild narrowing proximal right posterior cerebral artery. P2 segment the posterior cerebral artery mild to moderate irregularity greater on the right. Posterior cerebral artery distal branch vessel narrowing irregularity bilaterally. No aneurysm or vascular malformation noted. IMPRESSION: Intracranial atherosclerotic type changes as detailed above.  MRA NECK WITHOUT CONTRAST  No evidence hemodynamically significant stenosis involving either carotid bifurcation. Left vertebral artery is dominant. Ectatic proximal left vertebral artery with fold and mild narrowing. Minimal narrowing proximal right vertebral artery.  2D Echocardiogram ejection fraction 60-65%. No obvious source of cardiac emboli noted.  Carotid Doppler - see MRA of neck      Consultations:  Neuro   Discharge Exam: Filed Vitals:   08/29/12 1756 08/29/12 2241 08/30/12 0137 08/30/12 0541  BP: 129/74 131/89 176/87 145/74  Pulse: 83 78 74 64  Temp: 97.8 F (36.6 C) 98.1 F (36.7 C) 98 F (36.7 C) 97.9 F (36.6 C)  TempSrc: Oral Oral Oral Oral  Resp: 18 15 16 18   Height:      Weight:      SpO2: 98% 99% 100% 98%    General: axox3 Cardiovascular: rrr Respiratory: ctab   Discharge Instructions  Discharge Orders   Future Orders Complete By Expires     Diet - low sodium  heart healthy  As directed     Increase activity slowly  As directed         Medication List    STOP taking these medications       clopidogrel 75 MG tablet  Commonly known as:  PLAVIX     MELOXICAM PO     simvastatin 80 MG tablet   Commonly known as:  ZOCOR     zolpidem 5 MG tablet  Commonly known as:  AMBIEN      TAKE these medications       atorvastatin 40 MG tablet  Commonly known as:  LIPITOR  Take 1 tablet (40 mg total) by mouth daily at 6 PM.     citalopram 10 MG tablet  Commonly known as:  CELEXA  Take 10 mg by mouth daily.     clonazePAM 0.5 MG tablet  Commonly known as:  KLONOPIN  Take 0.5 mg by mouth daily as needed for anxiety.     dipyridamole-aspirin 200-25 MG per 12 hr capsule  Commonly known as:  AGGRENOX  Take 1 capsule by mouth 2 (two) times daily.     insulin aspart 100 UNIT/ML injection  Commonly known as:  novoLOG  Inject 0-9 Units into the skin 3 (three) times daily with meals.     metFORMIN 500 MG tablet  Commonly known as:  GLUCOPHAGE  Take 500 mg by mouth 2 (two) times daily with a meal.       No Known Allergies     Follow-up Information   Follow up with Dorrene German, MD.   Contact information:   [address] [place] [address]        The results of significant diagnostics from this hospitalization (including imaging, microbiology, ancillary and laboratory) are listed below for reference.    Significant Diagnostic Studies: Mr Sophia Rodriguez FA Contrast  08/26/2012   *RADIOLOGY REPORT*  Clinical Data:  Two day history of difficulty walking and slurred speech.  Prior infarct.  Diabetic hypertensive patient.  MRI HEAD WITHOUT CONTRAST MRA HEAD AND NECK WITHOUT CONTRAST  Technique: Multiplanar, multiecho pulse sequences of the brain and surrounding structures were obtained according to standard protocol without intravenous contrast.  Angiographic images of the head were obtained using MRA technique without contrast.  Comparison: 07/06/2012 MR.  MRI HEAD  Findings:  Acute non hemorrhagic infarct at the junction of the right thalamus and posterior limb of the right internal capsule.  Remote moderate to large size infarct medial aspect left frontal and parietal lobe in  the anterior indicating artery distribution with encephalomalacia and subsequent dilation of the left lateral ventricle. Wallerian degeneration.  Remote infarct left globus pallidus.  Remote left paracentral small infarct.  Tiny remote right cerebellar infarct.  Mild small vessel disease type changes.  Mild cervical spondylotic changes without cord compression. Cervical medullary junction, pituitary region, pineal region and orbital structures unremarkable.  No intracranial mass or abnormal enhancement.  IMPRESSION: Acute non hemorrhagic infarct at the junction of the right thalamus and posterior limb of the right internal capsule.  Remote infarcts as discussed above.  MRA HEAD  Findings: Cavernous segment internal carotid artery narrowing and irregularity more notable on the left.  Hypoplastic A1 segment right anterior cerebral artery with narrowing and irregularity suggesting superimposed atherosclerotic type changes.  Mild narrowing M1 segment right middle cerebral artery.  Middle cerebral artery branch vessel mild to moderate irregularity narrowing.  Mild irregularity of the A2 segment of the anterior cerebral artery bilaterally.  Ectatic vertebral arteries and basilar artery.  The left vertebral artery is dominant. Very mild narrowing of the left vertebral artery after the takeoff of the left PICA. Mild narrowing distal right vertebral artery.  Irregularity and narrowing of the right PICA which is smaller than the left PICA.  Mild irregularity of the basilar artery without high-grade stenosis.  Nonvisualization AICA bilaterally.  Moderate narrowing the superior cerebellar artery bilaterally.  Mild narrowing proximal right posterior cerebral artery.  P2 segment the posterior cerebral artery mild to moderate irregularity greater on the right.  Posterior cerebral artery distal branch vessel narrowing irregularity bilaterally.  No aneurysm or vascular malformation noted.  IMPRESSION: Intracranial atherosclerotic  type changes as detailed above.  MRA NECK WITHOUT CONTRAST  Technique: Angiographic images of the neck were obtained using MRA technique without intravenous contrast.  Carotid stenosis measurements (when applicable) are obtained utilizing NASCET criteria, using the distal internal carotid diameter as the denominator.  Findings:  Normal configuration of the origin of the great vessels from the aortic arch.  Mild ectasia of the common carotid artery bilaterally.  Minimal irregularity carotid bifurcation without evidence of hemodynamically significant stenosis involving either carotid bifurcation.  Mild ectasia of the vertical cervical segment of the internal carotid artery bilaterally slightly greater on the right.  Left vertebral artery is dominant.  Ectatic proximal left vertebral artery with fold and mild narrowing.  Minimal narrowing proximal right vertebral artery.  IMPRESSION: No evidence hemodynamically significant stenosis involving either carotid bifurcation.  Left vertebral artery is dominant.  Ectatic proximal left vertebral artery with fold and mild narrowing.  Minimal narrowing proximal right vertebral artery.  Critical Value/emergent results were called by telephone at the time of interpretation on 08/26/2012 at 1:27 p.m. to Dr. Hyacinth Rodriguez, who verbally acknowledged these results.   Original Report Authenticated By: Lacy Duverney, M.D.   Mr Angiogram Neck W Wo Contrast  08/26/2012   *RADIOLOGY REPORT*  Clinical Data:  Two day history of difficulty walking and slurred speech.  Prior infarct.  Diabetic hypertensive patient.  MRI HEAD WITHOUT CONTRAST MRA HEAD AND NECK WITHOUT CONTRAST  Technique: Multiplanar, multiecho pulse sequences of the brain and surrounding structures were obtained according to standard protocol without intravenous contrast.  Angiographic images of the head were obtained using MRA technique without contrast.  Comparison: 07/06/2012 MR.  MRI HEAD  Findings:  Acute non hemorrhagic infarct  at the junction of the right thalamus and posterior limb of the right internal capsule.  Remote moderate to large size infarct medial aspect left frontal and parietal lobe in the anterior indicating artery distribution with encephalomalacia and subsequent dilation of the left lateral ventricle. Wallerian degeneration.  Remote infarct left globus pallidus.  Remote left paracentral small infarct.  Tiny remote right cerebellar infarct.  Mild small vessel disease type changes.  Mild cervical spondylotic changes without cord compression. Cervical medullary junction, pituitary region, pineal region and orbital structures unremarkable.  No intracranial mass or abnormal enhancement.  IMPRESSION: Acute non hemorrhagic infarct at the junction of the right thalamus and posterior limb of the right internal capsule.  Remote infarcts as discussed above.  MRA HEAD  Findings: Cavernous segment internal carotid artery narrowing and irregularity more notable on the left.  Hypoplastic A1 segment right anterior cerebral artery with narrowing and irregularity suggesting superimposed atherosclerotic type changes.  Mild narrowing M1 segment right middle cerebral artery.  Middle cerebral artery branch vessel mild to moderate irregularity narrowing.  Mild irregularity of the A2 segment of the anterior cerebral artery  bilaterally.  Ectatic vertebral arteries and basilar artery.  The left vertebral artery is dominant. Very mild narrowing of the left vertebral artery after the takeoff of the left PICA. Mild narrowing distal right vertebral artery.  Irregularity and narrowing of the right PICA which is smaller than the left PICA.  Mild irregularity of the basilar artery without high-grade stenosis.  Nonvisualization AICA bilaterally.  Moderate narrowing the superior cerebellar artery bilaterally.  Mild narrowing proximal right posterior cerebral artery.  P2 segment the posterior cerebral artery mild to moderate irregularity greater on the  right.  Posterior cerebral artery distal branch vessel narrowing irregularity bilaterally.  No aneurysm or vascular malformation noted.  IMPRESSION: Intracranial atherosclerotic type changes as detailed above.  MRA NECK WITHOUT CONTRAST  Technique: Angiographic images of the neck were obtained using MRA technique without intravenous contrast.  Carotid stenosis measurements (when applicable) are obtained utilizing NASCET criteria, using the distal internal carotid diameter as the denominator.  Findings:  Normal configuration of the origin of the great vessels from the aortic arch.  Mild ectasia of the common carotid artery bilaterally.  Minimal irregularity carotid bifurcation without evidence of hemodynamically significant stenosis involving either carotid bifurcation.  Mild ectasia of the vertical cervical segment of the internal carotid artery bilaterally slightly greater on the right.  Left vertebral artery is dominant.  Ectatic proximal left vertebral artery with fold and mild narrowing.  Minimal narrowing proximal right vertebral artery.  IMPRESSION: No evidence hemodynamically significant stenosis involving either carotid bifurcation.  Left vertebral artery is dominant.  Ectatic proximal left vertebral artery with fold and mild narrowing.  Minimal narrowing proximal right vertebral artery.  Critical Value/emergent results were called by telephone at the time of interpretation on 08/26/2012 at 1:27 p.m. to Dr. Hyacinth Rodriguez, who verbally acknowledged these results.   Original Report Authenticated By: Lacy Duverney, M.D.   Mr Sophia Rodriguez Wo Contrast  08/26/2012   *RADIOLOGY REPORT*  Clinical Data:  Two day history of difficulty walking and slurred speech.  Prior infarct.  Diabetic hypertensive patient.  MRI HEAD WITHOUT CONTRAST MRA HEAD AND NECK WITHOUT CONTRAST  Technique: Multiplanar, multiecho pulse sequences of the brain and surrounding structures were obtained according to standard protocol without intravenous  contrast.  Angiographic images of the head were obtained using MRA technique without contrast.  Comparison: 07/06/2012 MR.  MRI HEAD  Findings:  Acute non hemorrhagic infarct at the junction of the right thalamus and posterior limb of the right internal capsule.  Remote moderate to large size infarct medial aspect left frontal and parietal lobe in the anterior indicating artery distribution with encephalomalacia and subsequent dilation of the left lateral ventricle. Wallerian degeneration.  Remote infarct left globus pallidus.  Remote left paracentral small infarct.  Tiny remote right cerebellar infarct.  Mild small vessel disease type changes.  Mild cervical spondylotic changes without cord compression. Cervical medullary junction, pituitary region, pineal region and orbital structures unremarkable.  No intracranial mass or abnormal enhancement.  IMPRESSION: Acute non hemorrhagic infarct at the junction of the right thalamus and posterior limb of the right internal capsule.  Remote infarcts as discussed above.  MRA HEAD  Findings: Cavernous segment internal carotid artery narrowing and irregularity more notable on the left.  Hypoplastic A1 segment right anterior cerebral artery with narrowing and irregularity suggesting superimposed atherosclerotic type changes.  Mild narrowing M1 segment right middle cerebral artery.  Middle cerebral artery branch vessel mild to moderate irregularity narrowing.  Mild irregularity of the A2 segment of the anterior cerebral  artery bilaterally.  Ectatic vertebral arteries and basilar artery.  The left vertebral artery is dominant. Very mild narrowing of the left vertebral artery after the takeoff of the left PICA. Mild narrowing distal right vertebral artery.  Irregularity and narrowing of the right PICA which is smaller than the left PICA.  Mild irregularity of the basilar artery without high-grade stenosis.  Nonvisualization AICA bilaterally.  Moderate narrowing the superior  cerebellar artery bilaterally.  Mild narrowing proximal right posterior cerebral artery.  P2 segment the posterior cerebral artery mild to moderate irregularity greater on the right.  Posterior cerebral artery distal branch vessel narrowing irregularity bilaterally.  No aneurysm or vascular malformation noted.  IMPRESSION: Intracranial atherosclerotic type changes as detailed above.  MRA NECK WITHOUT CONTRAST  Technique: Angiographic images of the neck were obtained using MRA technique without intravenous contrast.  Carotid stenosis measurements (when applicable) are obtained utilizing NASCET criteria, using the distal internal carotid diameter as the denominator.  Findings:  Normal configuration of the origin of the great vessels from the aortic arch.  Mild ectasia of the common carotid artery bilaterally.  Minimal irregularity carotid bifurcation without evidence of hemodynamically significant stenosis involving either carotid bifurcation.  Mild ectasia of the vertical cervical segment of the internal carotid artery bilaterally slightly greater on the right.  Left vertebral artery is dominant.  Ectatic proximal left vertebral artery with fold and mild narrowing.  Minimal narrowing proximal right vertebral artery.  IMPRESSION: No evidence hemodynamically significant stenosis involving either carotid bifurcation.  Left vertebral artery is dominant.  Ectatic proximal left vertebral artery with fold and mild narrowing.  Minimal narrowing proximal right vertebral artery.  Critical Value/emergent results were called by telephone at the time of interpretation on 08/26/2012 at 1:27 p.m. to Dr. Hyacinth Rodriguez, who verbally acknowledged these results.   Original Report Authenticated By: Lacy Duverney, M.D.    Microbiology: No results found for this or any previous visit (from the past 240 hour(s)).   Labs: Basic Metabolic Panel:  Recent Labs Lab 08/26/12 1020 08/26/12 1052  NA 138 142  K 3.9 3.9  CL 103 106  CO2 25   --   GLUCOSE 104* 88  BUN 8 7  CREATININE 0.87 0.90  CALCIUM 9.9  --    Liver Function Tests:  Recent Labs Lab 08/26/12 1020  AST 12  ALT 8  ALKPHOS 77  BILITOT 0.4  PROT 7.8  ALBUMIN 3.9   No results found for this basename: LIPASE, AMYLASE,  in the last 168 hours No results found for this basename: AMMONIA,  in the last 168 hours CBC:  Recent Labs Lab 08/26/12 1020 08/26/12 1052  WBC 6.4  --   NEUTROABS 4.5  --   HGB 13.8 18.7*  HCT 41.0 55.0*  MCV 78.5  --   PLT 266  --    Cardiac Enzymes:  Recent Labs Lab 08/26/12 1020  TROPONINI <0.30   BNP: BNP (last 3 results) No results found for this basename: PROBNP,  in the last 8760 hours CBG:  Recent Labs Lab 08/29/12 0653 08/29/12 1127 08/29/12 1630 08/29/12 2244 08/30/12 0653  GLUCAP 116* 108* 93 99 118*       Signed:  Chela Sutphen  [facility] 08/30/2012, 10:22 AM

## 2012-08-30 NOTE — Progress Notes (Signed)
Physical Therapy Treatment Patient Details Name: Sophia Rodriguez MRN: [id number] DOB: 1950/09/20 Today's Date: 08/30/2012 Time: 7829-5621 PT Time Calculation (min): 25 min  PT Assessment / Plan / Recommendation Comments on Treatment Session  Pt continues with impaired sequencing and motor planning, flat affect and impaired initiation.  Min A with gait and mobility with delayed balance reactions noted.  Pt improves with repetition of tasks to decrease anxiety and improve motor planning.    Follow Up Recommendations  CIR;SNF     Does the patient have the potential to tolerate intense rehabilitation     Barriers to Discharge        Equipment Recommendations       Recommendations for Other Services    Frequency Min 4X/week   Plan Discharge plan remains appropriate;Frequency remains appropriate    Precautions / Restrictions Precautions Precautions: Fall Restrictions Weight Bearing Restrictions: No   Pertinent Vitals/Pain No c/o pain    Mobility  Bed Mobility Bed Mobility: Sitting - Scoot to Edge of Bed Supine to Sit: 4: Min assist Sitting - Scoot to Edge of Bed: 4: Min assist Details for Bed Mobility Assistance: requires increased time, verbal and gesturing cues, min A due to poor initiation, decreased motor planning Transfers Sit to Stand: 4: Min assist Details for Transfer Assistance: MIn A for forward wt shift as pt tends to lean posterior Ambulation/Gait Ambulation/Gait Assistance: 4: Min assist Ambulation Distance (Feet): 40 Feet Assistive device: Rolling walker;1 person hand held assist Ambulation/Gait Assistance Details: Initial gait training with RW 40' with min A for wt shifts, verbal cues for stepping and sequencing, improves with manual facilitation for wt shifts. Gait without AD with L HHA (pt used cane in L hand at home) pt able to perform with min A, no LOB, manual facilitation for wt shifts, cuing for sequencing and initiaion    Exercises     PT Diagnosis:     PT Problem List:   PT Treatment Interventions:     PT Goals Acute Rehab PT Goals PT Goal: Supine/Side to Sit - Progress: Progressing toward goal PT Goal: Sit to Supine/Side - Progress: Progressing toward goal PT Goal: Sit to Stand - Progress: Progressing toward goal PT Goal: Stand to Sit - Progress: Progressing toward goal PT Goal: Ambulate - Progress: Progressing toward goal  Visit Information  Last PT Received On: 08/30/12 Assistance Needed: +1    Subjective Data  Subjective: I will try   Cognition  Cognition Behavior During Therapy: Flat affect Overall Cognitive Status: Impaired/Different from baseline Problem Solving: Slow processing;Decreased initiation General Comments: Pt requires increased cuing (verbal and gestures) for sequencing and initiating funcitonal mobility tasks    Balance  Dynamic Standing Balance Dynamic Standing - Level of Assistance: 4: Min assist Dynamic Standing - Comments: standing wt shifts, pre gait stepping all directions without UE Support with min A.  Pt improved with repetition, limited by anxiety with movement, leaning posterior due to fear of falling, improves with repetition  End of Session PT - End of Session Equipment Utilized During Treatment: Gait belt Activity Tolerance: Patient tolerated treatment well Patient left: in bed;with call bell/phone within reach;with bed alarm set   GP     [facility] 08/30/2012, 10:27 AM

## 2012-08-30 NOTE — PMR Pre-admission (Signed)
PMR Admission Coordinator Pre-Admission Assessment  Patient: Sophia Rodriguez is an 62 y.o., female MRN: [id number] DOB: 11-28-1950 Height: 5\' 7"  (170.2 cm) Weight: 74.844 kg (165 lb)              Insurance Information PRIMARY:  Primary: Medicare Policy#: [id number] Subscriber: Patient Benefits: [employer] Effective Date: 07/23/11 Deductible: $1126 OOP Max: None Life Max: Unlimited CIR: 100% SNF: 100 days LBD:  Outpatient: 80% Copay: 20%  Home Health: 100% DME: 80%  Copay: 20% Providers: Patient's choice SECONDARY:BCBS out of state      Policy#: [id number]      Subscriber: self  Emergency Contact Information Contact Information   Name Relation Home Work Mobile   Drum-Gould,Evel Daughter [phone]  [phone]     Current Medical History  Patient Admitting Diagnosis: Right thalamic into internal capsule infarct  History of Present Illness: 62 y.o. 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 Patient lives alone and is using a cane walker prior to admission was noted history of falls. MRI of the brain showed acute nonhemorrhagic infarct at the junction of the right thalamus and posterior limb of the right internal capsule as well as remote moderate to large size infarct medial aspect left frontal and parietal lobes as well as left globus pallidus and remote left paracentral small infarct. MRA of the head with atherosclerotic type changes. MRA of the neck without significant stenosis. Echocardiogram pending. Carotid Dopplers with no ICA stenosis. Patient did not receive TPA. Neurology services consulted placed on Aggrenox therapy with Plavix being discontinued as well the addition of subcutaneous Lovenox for DVT prophylaxis.  08/30/12 Update:Patient with left upper sided weakness with decreased coordination and flat affect.  Therapies initiated and she was noted to have decrease in balance reactions as well as issues with motor planning and initiation.   Total: 2   Past Medical History  Past Medical History  Diagnosis Date  . Stroke   . Hypertension   . Diabetes mellitus     Family History  family history includes Stroke in her brother.  Prior Rehab/Hospitalizations: 2 months at SNF for rehab after CVA 3 years ago. Has been getting outpatient therapy for the last few weeks.   Current Medications  Current facility-administered medications:acetaminophen (TYLENOL) suppository 650 mg, 650 mg, Rectal, Q4H PRN, Sorin Luanne Bras, MD;  acetaminophen (TYLENOL) tablet 650 mg, 650 mg, Oral, Q4H PRN, Sorin C Lavera Guise, MD;  atorvastatin (LIPITOR) tablet 40 mg, 40 mg, Oral, q1800, Sorin C Lavera Guise, MD, 40 mg at 08/29/12 1711;  citalopram (CELEXA) tablet 10 mg, 10 mg, Oral, Daily, Sorin C Laza, MD, 10 mg at 08/30/12 1105 clonazePAM (KLONOPIN) tablet 0.5 mg, 0.5 mg, Oral, Daily PRN, Sorin C Lavera Guise, MD, 0.5 mg at 08/26/12 2145;  dipyridamole-aspirin (AGGRENOX) 200-25 MG per 12 hr capsule 1 capsule, 1 capsule, Oral, BID, Sorin C Laza, MD, 1 capsule at 08/30/12 1105;  enoxaparin (LOVENOX) injection 40 mg, 40 mg, Subcutaneous, Q24H, Sorin C Laza, MD, 40 mg at 08/29/12 2118 insulin aspart (novoLOG) injection 0-9 Units, 0-9 Units, Subcutaneous, TID WC, Sorin C Laza, MD, 1 Units at 08/28/12 1727;  zolpidem (AMBIEN) tablet 5 mg, 5 mg, Oral, QHS PRN, Rolan Lipa, NP, 5 mg at 08/29/12 2118  Patients Current Diet: Cardiac  Precautions / Restrictions Precautions Precautions: Fall Precaution Comments: 2-3 falls at home within last month per pt.  Restrictions Weight Bearing Restrictions:  No   Prior Activity Level Limited Community (1-2x/wk): 2 X week Home Assistive Devices / Equipment Home Assistive Devices/Equipment: Cane (specify quad or straight) Home Adaptive Equipment: Walker - rolling;Straight cane;Wheelchair - manual;Grab bars  around toilet  Prior Functional Level Prior Function Level of Independence: Independent with assistive device(s) (daughter had to bring food for a couple of weeks) Able to Take Stairs?: No Driving: No Vocation: On disability  Current Functional Level Cognition  Arousal/Alertness: Awake/alert Overall Cognitive Status: Impaired/Different from baseline Current Attention Level: Sustained;Selective Orientation Level: Oriented X4 General Comments: Pt requires increased cuing (verbal and gestures) for sequencing and initiating funcitonal mobility tasks Attention: Sustained;Selective Sustained Attention: Appears intact Selective Attention: Appears intact Memory: Appears intact Awareness: Appears intact Problem Solving: Appears intact Safety/Judgment: Appears intact (need to call for assist, plan for rehab after hospital stay)    Extremity Assessment (includes Sensation/Coordination)  RUE ROM/Strength/Tone: Deficits RUE ROM/Strength/Tone Deficits: Pt demonstrating minimal elbow flexion with AROM of full shoulder flexion and also weakness in RUE as RUE drifted-demonstrating Brunstrom Stage V.  RUE Sensation: WFL - Light Touch RUE Coordination: WFL - fine motor  RLE ROM/Strength/Tone: Deficits RLE ROM/Strength/Tone Deficits: Decreased functional strength Rt. LE, grossly >3/5 however has increased difficulty clearing Rt. foot with ambulation. Was going to be fitted for AFO at outpatient PT day of admission RLE Sensation: WFL - Light Touch RLE Coordination: Deficits    ADLs  Eating/Feeding: Independent Where Assessed - Eating/Feeding: Chair Grooming: Performed;Wash/dry hands;Wash/dry face;Min guard Where Assessed - Grooming: Unsupported standing Upper Body Bathing: Set up;Supervision/safety Where Assessed - Upper Body Bathing: Supported sitting Lower Body Bathing: Minimal assistance Where Assessed - Lower Body Bathing: Supported sit to stand Upper Body Dressing: Set  up;Supervision/safety Where Assessed - Upper Body Dressing: Supported sitting Lower Body Dressing: Minimal assistance Where Assessed - Lower Body Dressing: Supported sit to Pharmacist, hospital: Performed;Minimal assistance;Min guard Statistician Method: Sit to stand (Minguard-sit to stand with grab bars and Min A-stand to sit) Acupuncturist: Raised toilet seat with arms (or 3-in-1 over toilet) Toileting - Clothing Manipulation and Hygiene: Performed;Minimal assistance Where Assessed - Engineer, mining and Hygiene: Sit to stand from 3-in-1 or toilet Tub/Shower Transfer Method: Not assessed Equipment Used: Gait belt;Rolling walker Transfers/Ambulation Related to ADLs: Pt requiring Mod A initially for ambulation as she has weakness in RLE requiring assistance to advance RLE. Pt progressed to Min A for ambulation. ADL Comments: Pt performed grooming at sink and toilet transfer. Min A for toilet hygiene/clothing management. Pt at Min A level for LB ADLs.     Mobility  Bed Mobility: Sitting - Scoot to Edge of Bed Rolling Right: 5: Supervision Supine to Sit: 4: Min assist Sitting - Scoot to Edge of Bed: 4: Min assist    Transfers  Transfers: Sit to Stand Sit to Stand: 4: Min assist Stand to Sit: 4: Min assist;To chair/3-in-1;To toilet    Ambulation / Gait / Stairs / Wheelchair Mobility  Ambulation/Gait Ambulation/Gait Assistance: 4: Min Environmental consultant (Feet): 40 Feet Assistive device: Rolling walker;1 person hand held assist Ambulation/Gait Assistance Details: Initial gait training with RW 40' with min A for wt shifts, verbal cues for stepping and sequencing, improves with manual facilitation for wt shifts. Gait without AD with L HHA (pt used cane in L hand at home) pt able to perform with min A, no LOB, manual facilitation for wt shifts, cuing for sequencing and initiaion Gait Pattern: Step-to pattern;Decreased step length - right;Decreased stance  time - right;Decreased  weight shift to right;Decreased dorsiflexion - right;Wide base of support;Trunk flexed (decreased foot clearance bilaterally) Stairs: No    Posture / Balance Static Standing Balance Static Standing - Balance Support: Bilateral upper extremity supported Static Standing - Level of Assistance: 4: Min assist Dynamic Standing Balance Dynamic Standing - Balance Support: Bilateral upper extremity supported Dynamic Standing - Level of Assistance: 4: Min assist Dynamic Standing - Balance Activities: Lateral lean/weight shifting;Forward lean/weight shifting Dynamic Standing - Comments: standing wt shifts, pre gait stepping all directions without UE Support with min A.  Pt improved with repetition, limited by anxiety with movement, leaning posterior due to fear of falling, improves with repetition    Special needs/care consideration Skin: WDL                             Bowel mgmt:  WDL, LBM: 08/29/12 Bladder mgmt: WDL Diabetic mgmt: yes    Previous Home Environment Living Arrangements: Alone Lives With: Alone Available Help at Discharge: Family;Available PRN/intermittently (daughter works) Type of Home: Apartment Home Layout: One level Home Access: Level entry Bathroom Shower/Tub: Engineer, manufacturing systems: Handicapped height Bathroom Accessibility: Yes How Accessible: Accessible via walker Home Care Services: No Additional Comments: used cane PTA  Discharge Living Setting Plans for Discharge Living Setting: Patient's home Type of Home at Discharge: Apartment Discharge Home Layout: One level Discharge Home Access: Level entry Discharge Bathroom Shower/Tub: Tub/shower unit Discharge Bathroom Toilet: Handicapped height Discharge Bathroom Accessibility: Yes How Accessible: Accessible via walker Do you have any problems obtaining your medications?: No  Social/Family/Support Systems Patient Roles: Parent Contact Information: home: [phone] Anticipated  Caregiver: Daughter: Claudine Mouton Anticipated Caregiver's Contact Information: [phone] Caregiver Availability: Intermittent Discharge Plan Discussed with Primary Caregiver: No (Await call-back) Is Caregiver In Agreement with Plan?: To be assessed Does Caregiver/Family have Issues with Lodging/Transportation while Pt is in Rehab?: No   Goals/Additional Needs Patient/Family Goal for Rehab: Supervision overall Expected length of stay: 12-14 days Cultural Considerations: none Equipment Needs: TBD Pt/Family Agrees to Admission and willing to participate: Yes Program Orientation Provided & Reviewed with Pt/Caregiver Including Roles  & Responsibilities: Yes  Decrease burden of Care through IP rehab admission: n/a  Possible need for SNF placement upon discharge: Not anticipated. Patient's  goal is to be modified independent.   Patient Condition: This patient's medical and functional status has changed since the consult dated: 08/29/12 in which the Rehabilitation Physician determined and documented that the patient's condition is appropriate for intensive rehabilitative care in an inpatient rehabilitation facility. See "History of Present Illness" (above) for medical update. Functional changes are: Ambulating 69' with min-mod assistance. Patient's medical and functional status update has been discussed with the Rehabilitation physician and patient remains appropriate for inpatient rehabilitation. Will admit to inpatient rehab today.  Preadmission Screen Completed By:  Meryl Dare, 08/30/2012 11:29 AM ______________________________________________________________________   Discussed status with Dr. Wynn Banker on 08/30/12 at 11:45AM and received telephone approval for admission today.  Admission Coordinator:  Meryl Dare, time 11:47 AM/Date 08/30/12

## 2012-08-30 NOTE — Progress Notes (Signed)
Occupational Therapy Treatment Patient Details Name: Sophia Rodriguez MRN: [id number] DOB: 05/03/1950 Today's Date: 08/30/2012 Time: 1330-1405 OT Time Calculation (min): 35 min  OT Assessment / Plan / Recommendation Comments on Treatment Session  Pt progressing towards goals. Practiced LB dressing, grooming, and toileting. Planning to d/c to CIR today.     Follow Up Recommendations  CIR;Supervision/Assistance - 24 hour    Barriers to Discharge       Equipment Recommendations  None recommended by OT    Recommendations for Other Services    Frequency Min 2X/week   Plan Discharge plan remains appropriate    Precautions / Restrictions Precautions Precautions: Fall Precaution Comments: 2-3 falls at home within last month per pt.  Restrictions Weight Bearing Restrictions: No   Pertinent Vitals/Pain No pain reported.     ADL  Grooming: Performed;Wash/dry hands;Teeth care;Min guard Where Assessed - Grooming: Supported standing Lower Body Dressing: Min guard Where Assessed - Lower Body Dressing: Supported sit to Pharmacist, hospital: Performed;Min Pension scheme manager Method: Sit to Barista: Comfort height toilet;Grab bars Toileting - Architect and Hygiene: Minimal assistance Where Assessed - Engineer, mining and Hygiene: Sit to stand from 3-in-1 or toilet Equipment Used: Gait belt;Rolling walker Transfers/Ambulation Related to ADLs: Min A for ambulation to assist in advancing RLE. Mod A for tranfers from recliner chair. Minguard from comfort height toilet with use of grab bars.  ADL Comments: Pt at Minguard level for donning underwear with sit to stand transfer. Pt performed toileting and at Min A level for hygiene/clothing.      OT Diagnosis:    OT Problem List:   OT Treatment Interventions:     OT Goals Acute Rehab OT Goals OT Goal Formulation: With patient Time For Goal Achievement: 09/03/12 Potential to Achieve Goals:  Good ADL Goals Pt Will Perform Grooming: Standing at sink;with supervision ADL Goal: Grooming - Progress: Progressing toward goals Pt Will Perform Lower Body Bathing: with supervision;Sit to stand from chair Pt Will Perform Lower Body Dressing: with supervision;Sit to stand from bed;Sit to stand from chair ADL Goal: Lower Body Dressing - Progress: Progressing toward goals Pt Will Transfer to Toilet: Ambulation;with DME;with supervision ADL Goal: Toilet Transfer - Progress: Progressing toward goals Pt Will Perform Toileting - Clothing Manipulation: with supervision;Standing ADL Goal: Toileting - Clothing Manipulation - Progress: Progressing toward goals Pt Will Perform Toileting - Hygiene: with supervision;Sit to stand from 3-in-1/toilet;Sitting on 3-in-1 or toilet ADL Goal: Toileting - Hygiene - Progress: Progressing toward goals Pt Will Perform Tub/Shower Transfer: Tub transfer;with supervision;Ambulation;with DME  Visit Information  Last OT Received On: 08/30/12 Assistance Needed: +1    Subjective Data      Prior Functioning       Cognition  Cognition Arousal/Alertness: Awake/alert Behavior During Therapy: Flat affect Overall Cognitive Status: Impaired/Different from baseline Area of Impairment: Problem solving Problem Solving: Slow processing    Mobility  Bed Mobility Bed Mobility: Not assessed Transfers Transfers: Sit to Stand;Stand to Sit Sit to Stand: 3: Mod assist;From chair/3-in-1;4: Min guard;From toilet Details for Transfer Assistance: Mod A for sit to stand from recliner chair. Minguard for transfers from comfort height toilet with use of grab bars. Cues for sequencing and hand placement.    Exercises      Balance     End of Session OT - End of Session Equipment Utilized During Treatment: Gait belt Activity Tolerance: Patient tolerated treatment well Patient left: in chair;with call bell/phone within reach  GO  Earlie Raveling  OTR/L [phone] 08/30/2012, 3:19 PM

## 2012-08-30 NOTE — H&P (Signed)
Report called to 4000,  Patient ready for transfer.  No compliants at this time.

## 2012-08-30 NOTE — Plan of Care (Addendum)
Overall Plan of Care [facility]) Patient Details Name: Sophia Rodriguez MRN: [id number] DOB: 1950/09/11  Diagnosis:  Rehabilitation for R thalamus and R int capsule infarct  Co-morbidities: Stroke  .  Hypertension  .  Diabetes mellitus    Functional Problem List  Patient demonstrates impairments in the following areas: Balance, Motor, Nutrition, Safety and Skin Integrity  Basic ADL's: grooming, bathing, dressing and toileting Advanced ADL's: simple meal preparation  Transfers:  bed mobility, bed to chair, toilet, tub/shower, car, furniture and floor Locomotion:  ambulation, wheelchair mobility and stairs  Additional Impairments:  Communication  expression and Social Cognition   problem solving and memory  Anticipated Outcomes Item Anticipated Outcome  Eating/Swallowing    Basic self-care  Supervision   Tolieting  Supervision   Bowel/Bladder  Continent of bowel/bladder with mod independence  Transfers  Mod I with LRAD  Locomotion  Mod I 150' with LRAD, 5 stairs with one handrail with S, w/c mobility x150' with S  Communication  Supervision-Mod I   Cognition  Supervision   Pain  Pain level 3 or less 1-10 scale   Safety/Judgment    Other  Skin: skin will remain C.D.I. While on Inpatient Rehab   Therapy Plan: PT Intensity: Minimum of 1-2 x/day ,45 to 90 minutes PT Frequency: 5 out of 7 days PT Duration Estimated Length of Stay: 10-12 days OT Intensity: Minimum of 1-2 x/day, 45 to 90 minutes OT Frequency: 5 out of 7 days OT Duration/Estimated Length of Stay: 10-12 days SLP Intensity: Minumum of 1-2 x/day, 30 to 90 minutes SLP Frequency: 5 out of 7 days SLP Duration/Estimated Length of Stay: 2 weeks    Team Interventions: Item RN PT OT SLP SW TR Other  Self Care/Advanced ADL Retraining   x      Neuromuscular Re-Education  x x      Therapeutic Activities  x x   x   UE/LE Strength Training/ROM  x x   x   UE/LE Coordination Activities  x x   x   Visual/Perceptual  Remediation/Compensation   x      DME/Adaptive Equipment Instruction  x x   x   Therapeutic Exercise  x x   x   Balance/Vestibular Training  x x   x   Patient/Family Education x x x x  x   Cognitive Remediation/Compensation   x x  x   Functional Mobility Training  x x   x   Ambulation/Gait Training  x       Stair Training  x       Wheelchair Propulsion/Positioning  x       Functional Tourist information centre manager Reintegration  x x   x   Dysphagia/Aspiration Landscape architect Facilitation    x     Bladder Management x        Bowel Management x        Disease Management/Prevention x x x      Pain Management x x x      Medication Management x   x     Skin Care/Wound Management x        Splinting/Orthotics  x       Discharge Planning  x x x  x   Psychosocial Support  x x   x  Team Discharge Planning: Destination: PT-Home ,OT- Home , SLP-Home Projected Follow-up: PT-Home health PT;Outpatient PT, OT-  Home health OT, SLP- (TBD) Projected Equipment Needs: PT-None recommended by PT, OT-  , SLP-None recommended by SLP Patient/family involved in discharge planning: PT- Patient,  OT-Patient, SLP-Patient  MD ELOS:7-10 days Medical Rehab Prognosis:  Excellent Assessment: 62 yo female admitted for acute left HP, now requiring 24/7 reha b RN/MD, CIR level PT and OT.  Treatment team to concentrate on ADLs and Mobility with goals of Sup/Mod I    See Team Conference Notes for weekly updates to the plan of care

## 2012-08-30 NOTE — Progress Notes (Signed)
Stroke Team Progress Note  HISTORY Sophia Rodriguez is a 62 y.o. 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  She reported that she did recover very well from her prior strokes and was quite functional. However, on 08/25/2012  she couldn't get up from bed and when she was finally able to do so she had a great deal of difficulty moving around. She stated that she felt that the right side of her body got weaker and her balance was off. Then, the next day she felt weak and family decided to bring her to the hospital.  Sophia Rodriguez denies associated headache, vertigo, double vision, difficulty swallowing, slurred speech, language or vision impairment.  She had been taking plavix consistently.  MRI-DWI performed at [facility] 08/26/2012 revealed an acute lacunar infarct involving the right thalamus-posterior limb IC.  MRA neck showed no hemodynamically significant carotid disease.  MRA brain demonstrated mildly diffuse intracranial atherosclerotic disease and ectatic vertebral arteries and basilar arteries.  She was switched from plavix to aggrenox at the time of this admission.   Date last known well: 08/25/12  Time last known well: uncertain  tPA Given: no, late presentation.  SUBJECTIVE There are no family members present this morning. The patient is lying in bed and she has no complaints. I observed her ambulating in the hall with a walker accompanied by the therapist. She seemed to be doing well. The patient still has a very flat affect.   OBJECTIVE Most recent Vital Signs: Filed Vitals:   08/29/12 1756 08/29/12 2241 08/30/12 0137 08/30/12 0541  BP: 129/74 131/89 176/87 145/74  Pulse: 83 78 74 64  Temp: 97.8 F (36.6 C) 98.1 F (36.7 C) 98 F (36.7 C) 97.9 F (36.6 C)  TempSrc: Oral Oral  Oral Oral  Resp: 18 15 16 18   Height:      Weight:      SpO2: 98% 99% 100% 98%   CBG (last 3)   Recent Labs  08/29/12 1630 08/29/12 2244 08/30/12 0653  GLUCAP 93 99 118*    IV Fluid Intake:     MEDICATIONS  . atorvastatin  40 mg Oral q1800  . citalopram  10 mg Oral Daily  . dipyridamole-aspirin  1 capsule Oral BID  . enoxaparin (LOVENOX) injection  40 mg Subcutaneous Q24H  . insulin aspart  0-9 Units Subcutaneous TID WC   PRN:  acetaminophen, acetaminophen, clonazePAM, zolpidem  Diet:  Cardiac thin liquids Activity:  Up with assistance DVT Prophylaxis:  Lovenox  CLINICALLY SIGNIFICANT STUDIES Basic Metabolic Panel:   Recent Labs Lab 08/26/12 1020 08/26/12 1052  NA 138 142  K 3.9 3.9  CL 103 106  CO2 25  --   GLUCOSE 104* 88  BUN 8 7  CREATININE 0.87 0.90  CALCIUM 9.9  --    Liver Function Tests:   Recent Labs Lab 08/26/12 1020  AST 12  ALT 8  ALKPHOS 77  BILITOT 0.4  PROT 7.8  ALBUMIN 3.9   CBC:   Recent Labs Lab 08/26/12 1020 08/26/12 1052  WBC 6.4  --   NEUTROABS 4.5  --   HGB 13.8 18.7*  HCT 41.0 55.0*  MCV 78.5  --   PLT 266  --    Coagulation:   Recent Labs Lab 08/26/12 1020  LABPROT 13.7  INR 1.06  Cardiac Enzymes:   Recent Labs Lab 08/26/12 1020  TROPONINI <0.30   Urinalysis:   Recent Labs Lab 08/26/12 1106  COLORURINE YELLOW  LABSPEC 1.017  PHURINE 6.0  GLUCOSEU NEGATIVE  HGBUR NEGATIVE  BILIRUBINUR SMALL*  KETONESUR 40*  PROTEINUR NEGATIVE  UROBILINOGEN 1.0  NITRITE NEGATIVE  LEUKOCYTESUR NEGATIVE   Lipid Panel    Component Value Date/Time   CHOL 195 08/27/2012 0530   TRIG 120 08/27/2012 0530   HDL 43 08/27/2012 0530   CHOLHDL 4.5 08/27/2012 0530   VLDL 24 08/27/2012 0530   LDLCALC 128* 08/27/2012 0530   HgbA1C  Lab Results  Component Value Date   HGBA1C 5.9* 08/27/2012    Urine Drug Screen:   No results found for this basename: labopia,  cocainscrnur,  labbenz,  amphetmu,  thcu,  labbarb    Alcohol  Level:   Recent Labs Lab 08/26/12 1020  ETH <11   and a  MRI Head Wo Contrast  08/26/2012  Acute non hemorrhagic infarct at the junction of the right thalamus and posterior limb of the right internal capsule.  Remote infarcts noted.    MRA HEAD  Findings: Cavernous segment internal carotid artery narrowing and irregularity more notable on the left.  Hypoplastic A1 segment right anterior cerebral artery with narrowing and irregularity suggesting superimposed atherosclerotic type changes.  Mild narrowing M1 segment right middle cerebral artery.  Middle cerebral artery branch vessel mild to moderate irregularity narrowing.  Mild irregularity of the A2 segment of the anterior cerebral artery bilaterally.  Ectatic vertebral arteries and basilar artery.  The left vertebral artery is dominant. Very mild narrowing of the left vertebral artery after the takeoff of the left PICA. Mild narrowing distal right vertebral artery.  Irregularity and narrowing of the right PICA which is smaller than the left PICA.  Mild irregularity of the basilar artery without high-grade stenosis.  Nonvisualization AICA bilaterally.  Moderate narrowing the superior cerebellar artery bilaterally.  Mild narrowing proximal right posterior cerebral artery.  P2 segment the posterior cerebral artery mild to moderate irregularity greater on the right.  Posterior cerebral artery distal branch vessel narrowing irregularity bilaterally.  No aneurysm or vascular malformation noted. IMPRESSION: Intracranial atherosclerotic type changes as detailed above.     MRA NECK WITHOUT CONTRAST   No evidence hemodynamically significant stenosis involving either carotid bifurcation.  Left vertebral artery is dominant.  Ectatic proximal left vertebral artery with fold and mild narrowing.  Minimal narrowing proximal right vertebral artery.  2D Echocardiogram  ejection fraction 60-65%. No obvious source of cardiac emboli noted.  Carotid Doppler  - see  MRA of neck  CXR    EKG normal sinus rhythm rate 92 beats per minute  Therapy Recommendations inpatient rehabilitation versus skilled nursing facility placement recommended.  Physical Exam   General - 62 year-old female in bed in no acute distress.The patient appears more alert today although still with somewhat of a flat affect. Heart - Regular rate and rhythm - no murmer Lungs - Clear to auscultation Abdomen - Soft - non tender Extremities - Distal pulses intact - no edema Skin - Warm and dry  NEUROLOGIC:   MENTAL STATUS: awake, oriented, language fluent,  follows simple commands. Flat affect and somewhat slow to respond. CRANIAL NERVES: pupils equal and reactive to light,extraocular muscles intact, mild left lower face weakness, uvula midlinec, tongue midline MOTOR: normal bulk and tone, Strength -5 over 5 except the left upper extremity which is 4/5.weakness left grip. Orbits right over left upper  extremity. Diminished fine finger movements on left SENSORY: normal and symmetric to light touch  COORDINATION: finger-nose-finger with mild difficulty on the left secondary to weakness.  ASSESSMENT Sophia Rodriguez is a 62 y.o. female presenting with left upper extremity paresis and balance problems. T-PA was not used secondary to late presentation. Imaging confirms an acute non hemorrhagic infarct at the junction of the right thalamus and posterior limb of the right internal capsule.  Remote infarcts noted as well. Infarct felt to be thrombotic secondary to small vessel disease.  On clopidogrel 75 mg orally every day prior to admission. Now on dipyridamole SR 250 mg/aspirin 25 mg orally twice a day for secondary stroke prevention. Patient with resultant left upper extremity paresis. Work up completed.   Inpatient rehabilitation here at [facility] is planned.  Diabetes mellitus - hemoglobin A1c 5.9  Hyperlipidemia - LDL 128 -Zocor prior to admission. Currently on  Lipitor.  Hypertension history  Remote CVAs with previous right hemiparesis.  Hospital day # 4  TREATMENT/PLAN  Continue dipyridamole SR 250 mg/aspirin 25 mg orally twice a day for secondary stroke prevention.  Inpatient rehabilitation planned. Awaiting final word regarding transfer.  The stroke service will sign off at  this time. Please call for further assistance or questions.  Delton See PA-C [facility] Pager [phone] 08/30/2012, 8:43 AM  I have personally obtained a history, examined the patient, evaluated imaging results, and formulated the assessment and plan of care. I agree with the above.   Delia Heady, MD

## 2012-08-30 NOTE — H&P (Signed)
Chief Complaint   Patient presents with   .  Left sided weakness, difficulty walking, balance problems.   HPI: Sophia Rodriguez is a 62 y.o. 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 She was admitted on 08/26/12 with 24 hour history of difficulty walking, decrease in balance as well as increased right sided weakness. MRI of the brain showed acute nonhemorrhagic infarct at the junction of the right thalamus and posterior limb of the right internal capsule as well as remote moderate to large size infarct medial aspect left frontal and parietal lobes as well as left globus pallidus and remote left paracentral small infarct. MRA of the head with atherosclerotic type changes. MRA of the neck without significant stenosis. Echocardiogram with EF 60-65% and source of emboli. Carotid Dopplers with no ICA stenosis. Neurology recommended changing patient to aggrenox for thrombotic stroke due to SVD. Patient with left upper sided weakness with decreased coordination and flat affect. Therapies initiated and she was noted to have decrease in balance reactions as well as issues with motor planning and initiation. Therapy team and MD recommended CIR and patient admitted today.    feels good today. No pain complaints currently Review of Systems  Constitutional: Positive for malaise/fatigue (past 2 months with decrease in appetite. ).  HENT: Negative for hearing loss.  Eyes: Positive for blurred vision (some floaters in bilateral eyes).  Respiratory: Negative for cough and shortness of breath.  Cardiovascular: Negative for chest pain and palpitations.  Gastrointestinal: Negative for heartburn, nausea, vomiting and abdominal pain.  Genitourinary: Negative for urgency and frequency.  Musculoskeletal: Positive for joint pain (l;eft hand pain--took mobic bid). Negative for myalgias.   Balance problems past 2 months.  Neurological: Positive for focal weakness (right sided weakness). Negative for headaches.  Psychiatric/Behavioral: Negative for depression. The patient has insomnia (takes [place]).   Past Medical History   Diagnosis  Date   .  Stroke    .  Hypertension    .  Diabetes mellitus     History reviewed. No pertinent past surgical history.  Family History   Problem  Relation  Age of Onset   .  Stroke  Brother     Social History: Lives alone. Disabled SW--use cane out of home. Independent but daughter has been helping out past couple of months due to endurance issues. She reports that she quit smoking about 5 years ago. She does not have any smokeless tobacco history on file. She reports that she does not drink alcohol or use illicit drugs.  Allergies: No Known Allergies  Medications Prior to Admission   Medication  Sig  Dispense  Refill   .  citalopram (CELEXA) 10 MG tablet  Take 10 mg by mouth daily.     .  clonazePAM (KLONOPIN) 0.5 MG tablet  Take 0.5 mg by mouth daily as needed for anxiety.     .  metFORMIN (GLUCOPHAGE) 500 MG tablet  Take 500 mg by mouth 2 (two) times daily with a meal.     .  [DISCONTINUED] clopidogrel (PLAVIX) 75 MG tablet  Take 75 mg by mouth daily.     .  [DISCONTINUED] MELOXICAM PO  Take 7.5 mg by mouth 2 (two) times daily as needed (pain).     .  [DISCONTINUED] simvastatin (ZOCOR) 80 MG tablet  Take 80 mg by mouth at bedtime.     .  [DISCONTINUED] zolpidem (AMBIEN) 5  MG tablet  Take 5 mg by mouth at bedtime as needed. For sleep      Home:  Home Living  Lives With: Alone  Available Help at Discharge: Family;Available PRN/intermittently (daughter works)  Type of Home: Apartment  Home Access: Level entry  Home Layout: One level  Bathroom Shower/Tub: Medical sales representative: Handicapped height  Bathroom Accessibility: Yes  How Accessible: Accessible via walker  Home Adaptive Equipment: Walker - rolling;Straight  cane;Wheelchair - manual;Grab bars around toilet  Additional Comments: used cane PTA  Functional History:  Prior Function  Able to Take Stairs?: No  Driving: No  Vocation: On disability  Functional Status:  Mobility:  Bed Mobility  Bed Mobility: Sitting - Scoot to Edge of Bed  Rolling Right: 5: Supervision  Supine to Sit: 4: Min assist  Sitting - Scoot to [place] of Bed: 4: Min assist  Transfers  Transfers: Sit to Stand  Sit to Stand: 4: Min assist  Stand to Sit: 4: Min assist;To chair/3-in-1;To toilet  Ambulation/Gait  Ambulation/Gait Assistance: 4: Min assist  Ambulation Distance (Feet): 40 Feet  Assistive device: Rolling walker;1 person hand held assist  Ambulation/Gait Assistance Details: Initial gait training with RW 40' with min A for wt shifts, verbal cues for stepping and sequencing, improves with manual facilitation for wt shifts. Gait without AD with L HHA (pt used cane in L hand at home) pt able to perform with min A, no LOB, manual facilitation for wt shifts, cuing for sequencing and initiaion  Gait Pattern: Step-to pattern;Decreased step length - right;Decreased stance time - right;Decreased weight shift to right;Decreased dorsiflexion - right;Wide base of support;Trunk flexed (decreased foot clearance bilaterally)  Stairs: No   ADL:  ADL  Eating/Feeding: Independent  Where Assessed - Eating/Feeding: Chair  Grooming: Performed;Wash/dry hands;Wash/dry face;Min guard  Where Assessed - Grooming: Unsupported standing  Upper Body Bathing: Set up;Supervision/safety  Where Assessed - Upper Body Bathing: Supported sitting  Lower Body Bathing: Minimal assistance  Where Assessed - Lower Body Bathing: Supported sit to stand  Upper Body Dressing: Set up;Supervision/safety  Where Assessed - Upper Body Dressing: Supported sitting  Lower Body Dressing: Minimal assistance  Where Assessed - Lower Body Dressing: Supported sit to Scientist, research (life sciences): Performed;Minimal assistance;Min  guard  Statistician Method: Sit to stand ([place]-sit to stand with grab bars and Min A-stand to sit)  Acupuncturist: Raised toilet seat with arms (or 3-in-1 over toilet)  Tub/Shower Transfer Method: Not assessed  Equipment Used: Gait belt;Rolling walker  Transfers/Ambulation Related to ADLs: Pt requiring Mod A initially for ambulation as she has weakness in RLE requiring assistance to advance RLE. Pt progressed to Min A for ambulation.  ADL Comments: Pt performed grooming at sink and toilet transfer. Min A for toilet hygiene/clothing management. Pt at Min A level for LB ADLs.  Cognition:  Cognition  Overall Cognitive Status: Impaired/Different from baseline  Arousal/Alertness: Awake/alert  Orientation Level: Oriented X4  Attention: Sustained;Selective  Sustained Attention: Appears intact  Selective Attention: Appears intact  Memory: Appears intact  Awareness: Appears intact  Problem Solving: Appears intact  Safety/Judgment: Appears intact (need to call for assist, plan for rehab after hospital stay)  Cognition  Arousal/Alertness: Awake/alert  Behavior During Therapy: Flat affect  Overall Cognitive Status: Impaired/Different from baseline  Area of Impairment: Problem solving  Current Attention Level: Sustained;Selective  Problem Solving: Slow processing;Decreased initiation  General Comments: Pt requires increased cuing (verbal and gestures) for sequencing and initiating funcitonal mobility tasks  Physical  Exam:  Blood pressure 147/80, pulse 68, temperature 97.7 F (36.5 C), temperature source Oral, resp. rate 16, height 5\' 7"  (1.702 m), weight 74.844 kg (165 lb), SpO2 98.00%.  Physical Exam  Nursing note and vitals reviewed.  Constitutional: She is oriented to person, place, and time. She appears well-developed and well-nourished.  HENT:  Head: Normocephalic and atraumatic.  Eyes: Pupils are equal, round, and reactive to light.  Neck: Normal range of motion. Neck  supple.  Cardiovascular: Normal rate and regular rhythm.  Pulmonary/Chest: Effort normal and breath sounds normal. No respiratory distress. She has no wheezes.  Abdominal: Soft. Bowel sounds are normal.  Musculoskeletal: She exhibits no edema and no tenderness.  Neurological: She is alert and oriented to person, place, and time.  Soft spoken. Speech clear. Some delay in initiation but able to followcommands without difficulty. LUE weakness with decrease in fine motor control. RLE with minimal weakness.  Skin: Skin is warm and dry.  Psychiatric: Her speech is normal and behavior is normal. Thought content normal. Her mood appears appropriate. Cognition and memory are normal.  Motor strength: 4/5 in the right deltoid, biceps, triceps, grip, hip flexor, knee extensors, ankle dorsiflexor plantar flexor 4 minus/5 in the left deltoid, biceps, triceps, grip, hip flexor, knee extensors, ankle dorsiflexor and plantar flexor  Sensation intact to light touch and proprioception in bilateral hands and feet with the exception of left little finger Visual fields intact to confrontation testing Cerebellar testing no evidence of ataxia on finger nose to finger and heel-to-shin testing are Results for orders placed during the hospital encounter of 08/26/12 (from the past 48 hour(s))   GLUCOSE, CAPILLARY Status: None    Collection Time    08/28/12 11:53 AM   Result  Value  Range    Glucose-Capillary  87  70 - 99 mg/dL   GLUCOSE, CAPILLARY Status: Abnormal    Collection Time    08/28/12 4:54 PM   Result  Value  Range    Glucose-Capillary  144 (*)  70 - 99 mg/dL   GLUCOSE, CAPILLARY Status: Abnormal    Collection Time    08/28/12 10:15 PM   Result  Value  Range    Glucose-Capillary  101 (*)  70 - 99 mg/dL    Comment 1  Notify RN    GLUCOSE, CAPILLARY Status: Abnormal    Collection Time    08/29/12 6:53 AM   Result  Value  Range    Glucose-Capillary  116 (*)  70 - 99 mg/dL   GLUCOSE, CAPILLARY Status:  Abnormal    Collection Time    08/29/12 11:27 AM   Result  Value  Range    Glucose-Capillary  108 (*)  70 - 99 mg/dL   GLUCOSE, CAPILLARY Status: None    Collection Time    08/29/12 4:30 PM   Result  Value  Range    Glucose-Capillary  93  70 - 99 mg/dL   GLUCOSE, CAPILLARY Status: None    Collection Time    08/29/12 10:44 PM   Result  Value  Range    Glucose-Capillary  99  70 - 99 mg/dL    Comment 1  Notify RN    GLUCOSE, CAPILLARY Status: Abnormal    Collection Time    08/30/12 6:53 AM   Result  Value  Range    Glucose-Capillary  118 (*)  70 - 99 mg/dL    No results found.  Post Admission Physician Evaluation:  1. Functional deficits secondary to  acute right thalamic and right posterior limb internal capsule infarct causing left hemiparesis. 2. Patient is admitted to receive collaborative, interdisciplinary care between the physiatrist, rehab nursing staff, and therapy team. 3. Patient's level of medical complexity and substantial therapy needs in context of that medical necessity cannot be provided at a lesser intensity of care such as a SNF. 4. Patient has experienced substantial functional loss from his/her baseline which was documented above under the "Functional History" and "Functional Status" headings. Judging by the patient's diagnosis, physical exam, and functional history, the patient has potential for functional progress which will result in measurable gains while on inpatient rehab. These gains will be of substantial and practical use upon discharge in facilitating mobility and self-care at the household level. 5. Physiatrist will provide 24 hour management of medical needs as well as oversight of the therapy plan/treatment and provide guidance as appropriate regarding the interaction of the two. 6. 24 hour rehab nursing will assist with bladder management, bowel management, safety, skin/wound care, disease management, medication administration and patient education and help  integrate therapy concepts, techniques,education, etc. 7. PT will assess and treat for/with: Pre-gait, gait, neuromuscular reeducation, safety, endurance, equipment. Goals are: Supervision to modified independent level mobility. 8. OT will assess and treat for/with: ADLs, cognitive perceptual skills, safety, endurance, equipment. Goals are: Modified independent level ADLs. 9. SLP will assess and treat for/with: Not applicable. Goals are: Not applicable. 10. Case Management and Social Worker will assess and treat for psychological issues and discharge planning. 11. Team conference will be held weekly to assess progress toward goals and to determine barriers to discharge. 12. Patient will receive at least 3 hours of therapy per day at least 5 days per week. 13. ELOS: 7-10 days Prognosis: good Medical Problem List and Plan:  1. DVT Prophylaxis/Anticoagulation: Pharmaceutical: Lovenox  2. Pain Management: Will use tylenol prn for pain. Add voltaren gel for left wrist pain.  3. Mood: Flat affect with underlying anxiety noted. Will continue celexa and klonopin. Provide ego support. Will have LCSW follow up for evaluation.  4. Neuropsych: This patient is capable of making decisions on his/her own behalf.  5. DM type 2: Monitor BS with AC/HS checks. Off metformin.  6. HTN: Will monitor with bid checks. Will start low dose ace for better control will close monitoring for hypotension. .  7. Dyslipidemia: LDL--128. Will Lipitor in place of Zocor.

## 2012-08-31 ENCOUNTER — Ambulatory Visit: Payer: Medicare Other | Admitting: Physical Therapy

## 2012-08-31 ENCOUNTER — Inpatient Hospital Stay (HOSPITAL_COMMUNITY): Payer: Medicare Other

## 2012-08-31 ENCOUNTER — Inpatient Hospital Stay (HOSPITAL_COMMUNITY): Payer: Medicare Other | Admitting: *Deleted

## 2012-08-31 ENCOUNTER — Inpatient Hospital Stay (HOSPITAL_COMMUNITY): Payer: Medicare Other | Admitting: Speech Pathology

## 2012-08-31 ENCOUNTER — Ambulatory Visit: Payer: Medicare Other | Admitting: Occupational Therapy

## 2012-08-31 DIAGNOSIS — G8110 Spastic hemiplegia affecting unspecified side: Secondary | ICD-10-CM

## 2012-08-31 LAB — CBC WITH DIFFERENTIAL/PLATELET
Eosinophils Relative: 1 % (ref 0–5)
Hemoglobin: 13.6 g/dL (ref 12.0–15.0)
Lymphocytes Relative: 29 % (ref 12–46)
Lymphs Abs: 1.4 10*3/uL (ref 0.7–4.0)
MCV: 79.4 fL (ref 78.0–100.0)
Monocytes Relative: 8 % (ref 3–12)
Platelets: 229 10*3/uL (ref 150–400)
RBC: 5.29 MIL/uL — ABNORMAL HIGH (ref 3.87–5.11)
WBC: 4.9 10*3/uL (ref 4.0–10.5)

## 2012-08-31 LAB — COMPREHENSIVE METABOLIC PANEL
ALT: 16 U/L (ref 0–35)
Alkaline Phosphatase: 80 U/L (ref 39–117)
CO2: 25 mEq/L (ref 19–32)
GFR calc Af Amer: 81 mL/min — ABNORMAL LOW (ref >90)
GFR calc non Af Amer: 70 mL/min — ABNORMAL LOW (ref >90)
Glucose, Bld: 117 mg/dL — ABNORMAL HIGH (ref 70–99)
Potassium: 3.7 mEq/L (ref 3.5–5.1)
Sodium: 140 mEq/L (ref 135–145)

## 2012-08-31 MED ORDER — ADULT MULTIVITAMIN W/MINERALS CH
1.0000 | ORAL_TABLET | Freq: Every day | ORAL | Status: DC
  Administered 2012-08-31 – 2012-09-10 (×11): 1 via ORAL
  Filled 2012-08-31 (×13): qty 1

## 2012-08-31 MED ORDER — ENSURE COMPLETE PO LIQD
120.0000 mL | Freq: Three times a day (TID) | ORAL | Status: DC
  Administered 2012-08-31 – 2012-09-03 (×10): 120 mL via ORAL

## 2012-08-31 NOTE — Progress Notes (Signed)
Occupational Therapy Assessment and Plan  Patient Details  Name: Sophia Rodriguez MRN: [id number] Date of Birth: 14-Jul-1950  OT Diagnosis: apraxia, cognitive deficits and muscle weakness (generalized) Rehab Potential: Rehab Potential: Good ELOS: 10-12 days   Today's Date: 08/31/2012 Time: 0830-0930 Time Calculation (min): 60 min  Problem List:  Patient Active Problem List   Diagnosis Date Noted  . 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 Stroke   . Hypertension     Past Medical History:  Past Medical History  Diagnosis Date  . Stroke   . Hypertension   . Diabetes mellitus    Past Surgical History: No past surgical history on file.  Assessment & Plan Clinical Impression: Patient is a 62 y.o. year old female with recent admission to the hospital on 08/26/12 with 24 hour history of difficulty walking, decrease in balance as well as increased right sided weakness. MRI of the brain showed acute nonhemorrhagic infarct at the junction of the right thalamus and posterior limb of the right internal capsule as well as remote moderate to large size infarct medial aspect left frontal and parietal lobes as well as left globus pallidus and remote left paracentral small infarct. MRA of the head with atherosclerotic type changes. MRA of the neck without significant stenosis. Echocardiogram with EF 60-65% and source of emboli. Carotid Dopplers with no ICA stenosis. Neurology recommended changing patient to aggrenox for thrombotic stroke due to SVD. Patient with left upper sided weakness with decreased coordination and flat affect. Therapies initiated and she was noted to have decrease in balance reactions as well as issues with motor planning and initiation. Patient transferred to CIR on 08/30/2012 .    Patient currently requires mod-min assist with basic self-care skills secondary to decreased cardiorespiratoy endurance, impaired timing and sequencing, decreased coordination and decreased  motor planning and decreased initiation, decreased problem solving, decreased safety awareness and delayed processing.  Prior to hospitalization, patient could complete BADLs with supervision.  Patient will benefit from skilled intervention to increase independence with basic self-care skills prior to discharge home with supervision .  Anticipate patient will require 24 hour supervision and follow up home health.  Skilled Therapeutic Intervention: Skilled therapeutic intervention completed following evaluation. Pt requires min-mod assist for transfers with constant verbal cues for motor planning as pt reported "I know what I need to do but I can't figure out how to make myself do it." Pt required verbal cues for initiation and sequencing during self-care tasks. Pt stood with min assist in shower then paused. OT asked what pt wanted to do and pt reported "I can't remember." Cued to wash buttocks and pt continued to standing holding wash cloth. Required tactile cue of tapping RUE before pt initiated completing task. Required increased time for processing during self-care tasks. Required cues for crossover tech to thread BLE into pants and don socks. Pt has slight decreased grip in L hand however able to use throughout therapy session during functional tasks. Pt required cues for hand placement for each sit<>stand transfer as she was consistently attempting to pull up on grab bars. Able to complete sit<>stand with min physical assist when pushing up from arm rest.   OT - End of Session Activity Tolerance: Decreased this session Endurance Deficit: Yes OT Assessment Rehab Potential: Good Barriers to Discharge: Decreased caregiver support OT Plan OT Intensity: Minimum of 1-2 x/day, 45 to 90 minutes OT Frequency: 5 out of 7 days OT Duration/Estimated Length of Stay: 10-12 days OT Treatment/Interventions: Balance/vestibular training;Cognitive  remediation/compensation;Discharge planning;Community  reintegration;DME/adaptive equipment instruction;Disease mangement/prevention;Functional mobility training;Neuromuscular re-education;Pain management;Psychosocial support;Patient/family education;Self Care/advanced ADL retraining;Therapeutic Activities;Therapeutic Exercise;UE/LE Strength taining/ROM;UE/LE Coordination activities;Visual/perceptual remediation/compensation OT Recommendation Patient destination: Home Follow Up Recommendations: Home health OT Equipment Details: pt has TTB, grab bars, HH shower rod   Skilled Therapeutic Intervention   OT Evaluation Precautions/Restrictions  Precautions Precautions: Fall Precaution Comments: 2-3 falls at home within last month per pt.  Restrictions Weight Bearing Restrictions: No General   Vital Signs   Pain Pain Assessment Pain Assessment: No/denies pain Pain Score: 0-No pain Home Living/Prior Functioning Home Living Lives With: Alone Available Help at Discharge: Family;Available PRN/intermittently (daughter works during the day) Type of Home: Apartment Home Access: Level entry Home Layout: One level Bathroom Shower/Tub: Engineer, manufacturing systems: Handicapped height Bathroom Accessibility: Yes How Accessible: Accessible via walker Home Adaptive Equipment: Walker - rolling;Straight cane;Wheelchair - manual;Grab bars around toilet;Hand-held shower hose;Grab bars in shower Additional Comments: Used SPC PTA IADL History Education: one year of grad school, 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  Prior Function Level of Independence: Requires assistive device for independence Able to Take Stairs?: No Driving: No Vocation: On disability ADL   Vision/Perception  Vision - History Baseline Vision: Wears glasses for distance only Patient Visual Report: No change from baseline Vision - Assessment Convergence: Within functional limits Visual Fields: No apparent deficits   Cognition Overall Cognitive Status: Impaired/Different from baseline Arousal/Alertness: Awake/alert Orientation Level: Oriented X4 Attention: Selective Sustained Attention: Appears intact Selective Attention: Appears intact Memory: Impaired Memory Impairment: Decreased recall of new information Awareness: Appears intact Problem Solving: Impaired Problem Solving Impairment: Functional complex Executive Function: Sequencing Sequencing: Impaired Sequencing Impairment: Functional basic;Functional complex Safety/Judgment: Appears intact Sensation Sensation Light Touch: Appears Intact Hot/Cold: Appears Intact Proprioception: Appears Intact Additional Comments: Proprioception intact at B great toes and B ankles Coordination Gross Motor Movements are Fluid and Coordinated: No Fine Motor Movements are Fluid and Coordinated: No Coordination and Movement Description: Decreased speed and accuracy with rapid alternating movements with L UE and L LE Finger Nose Finger Test: decreased speed and accuracy with LUE Heel Shin Test: dysmetria/undershooting with L LE Motor  Motor Motor: Within Functional Limits Motor - Skilled Clinical Observations: No clonus noted B LE Mobility  Bed Mobility Bed Mobility: Rolling Left;Supine to Sit;Sit to Supine;Sitting - Scoot to [place] of Bed;Rolling Right Rolling Right: 4: Min assist Rolling Right Details: Verbal cues for sequencing;Verbal cues for technique;Verbal cues for precautions/safety;Manual facilitation for weight shifting Rolling Left: 4: Min assist Rolling Left Details: Verbal cues for sequencing;Verbal cues for technique;Verbal cues for precautions/safety;Manual facilitation for weight shifting Supine to Sit: 5: Supervision;HOB flat Supine to Sit Details: Verbal cues for sequencing;Verbal cues for precautions/safety Sitting - Scoot to Edge of Bed: 5: Supervision Sitting - Scoot to [place] of Bed Details: Verbal cues for sequencing;Verbal cues for  precautions/safety Sit to Supine: 3: Mod assist;HOB flat Sit to Supine - Details: Verbal cues for sequencing;Verbal cues for technique;Verbal cues for precautions/safety;Manual facilitation for weight shifting Sit to Supine - Details (indicate cue type and reason): Requires lifting for B LEs Transfers Sit to Stand: 4: Min assist;With upper extremity assist;From chair/3-in-1;From bed;With armrests Sit to Stand Details: Verbal cues for sequencing;Verbal cues for technique;Verbal cues for precautions/safety;Manual facilitation for weight shifting Stand to Sit: 4: Min assist;To chair/3-in-1;To toilet Stand to Sit Details (indicate cue type and reason): Verbal cues for sequencing;Verbal cues for technique;Verbal cues for precautions/safety;Manual facilitation for weight shifting  Trunk/Postural Assessment  Cervical  Assessment Cervical Assessment: Within Functional Limits Thoracic Assessment Thoracic Assessment: Within Functional Limits Lumbar Assessment Lumbar Assessment: Within Functional Limits Postural Control Postural Control: Within Functional Limits  Balance Balance Balance Assessed: Yes Static Sitting Balance Static Sitting - Balance Support: Feet supported;No upper extremity supported Static Sitting - Level of Assistance: 5: Stand by assistance Static Sitting - Comment/# of Minutes: 2 min edge of mat Static Standing Balance Static Standing - Balance Support: Bilateral upper extremity supported Static Standing - Level of Assistance: 4: Min assist Dynamic Standing Balance Dynamic Standing - Balance Support: Bilateral upper extremity supported;During functional activity;Right upper extremity supported Dynamic Standing - Level of Assistance: 4: Min assist Dynamic Standing - Balance Activities: Lateral lean/weight shifting;Forward lean/weight shifting Extremity/Trunk Assessment RUE Assessment RUE Assessment: Within Functional Limits LUE Assessment LUE Assessment: Exceptions to  [facility] LUE Strength Gross Grasp: Functional (decreased grip strength when compaired to R)  FIM:  FIM - Grooming Grooming Steps: Wash, rinse, dry face;Wash, rinse, dry hands;Oral care, brush teeth, clean dentures Grooming: 5: Set-up assist to obtain items FIM - Bathing Bathing Steps Patient Completed: Right Arm;Right upper leg;Left upper leg;Abdomen;Left Arm;Buttocks;Front perineal area;Chest;Right lower leg (including foot);Left lower leg (including foot) Bathing: 4: Steadying assist FIM - Upper Body Dressing/Undressing Upper body dressing/undressing steps patient completed: Thread/unthread right bra strap;Thread/unthread left bra strap;Hook/unhook bra;Thread/unthread left sleeve of pullover shirt/dress;Thread/unthread right sleeve of pullover shirt/dresss;Put head through opening of pull over shirt/dress;Pull shirt over trunk Upper body dressing/undressing: 5: Set-up assist to: Obtain clothing/put away FIM - Lower Body Dressing/Undressing Lower body dressing/undressing steps patient completed: Thread/unthread right underwear leg;Pull underwear up/down;Thread/unthread right pants leg;Thread/unthread left pants leg;Thread/unthread left underwear leg;Pull pants up/down;Don/Doff left sock;Don/Doff right sock Lower body dressing/undressing: 4: Steadying Assist (verbal cues for tech) FIM - Toileting Toileting: 0: Activity did not occur FIM - Banker Devices: Arm rests;Walker Bed/Chair Transfer: 3: Bed > Chair or W/C: Mod A (lift or lower assist) FIM - Diplomatic Services operational officer Devices: Grab bars Toilet Transfers: 4-To toilet/BSC: Min A (steadying Pt. > 75%);4-From toilet/BSC: Min A (steadying Pt. > 75%) FIM - Secretary/administrator Devices: Shower chair;Walk in shower;Grab bars Tub/shower Transfers: 4-Into Tub/Shower: Min A (steadying Pt. > 75%/lift 1 leg);3-Out of Tub/Shower: Mod A (lift or lower/lift 2 legs)   Refer  to Care Plan for Long Term Goals  Recommendations for other services: None  Discharge Criteria: Patient will be discharged from OT if patient refuses treatment 3 consecutive times without medical reason, if treatment goals not met, if there is a change in medical status, if patient makes no progress towards goals or if patient is discharged from hospital.  The above assessment, treatment plan, treatment alternatives and goals were discussed and mutually agreed upon: by patient  Daneil Dan 08/31/2012, 12:10 PM

## 2012-08-31 NOTE — Progress Notes (Signed)
INITIAL NUTRITION ASSESSMENT  DOCUMENTATION CODES Per approved criteria  -Not Applicable   INTERVENTION: 1. MVI daily 2. 120 ml Ensure Complete with medications 3. RD to continue to follow nutrition care plan  NUTRITION DIAGNOSIS: Inadequate oral intake related to poor appetite as evidenced by pt report.   Goal: Intake to meet >90% of estimated nutrition needs.  Monitor:  weight trends, lab trends, I/O's, PO intake, supplement tolerance  Reason for Assessment: Malnutrition Screening Tool  62 y.o. female  Admitting Dx: Stroke  ASSESSMENT: PMHx of HTN, DM with peripheral neuropathy and previous CVA x 2. Admitted 6/5 with acute nonhemorrhagic infarct.  RD drawn to chart 2/2 Malnutrition Screening Tool. Pt reports poor appetite and weight loss. Currently eating 50% of Heart Healthy meals. Per pt, she has lost 9 lb in 2-3 months, this is 5% wt loss and is not significant for this time frame. Pt reports that she just nibbles off of her tray. She likes yogurt, stated the nurse brought her one to eat.  Pt is at risk for malnutrition given hx of chronic medical issues and poor oral intake.  Height: Ht Readings from Last 1 Encounters:  08/26/12 5\' 7"  (1.702 m)    Weight: Wt Readings from Last 1 Encounters:  08/30/12 161 lb 13.1 oz (73.4 kg)    Ideal Body Weight: 135 lb  % Ideal Body Weight: 119%  Wt Readings from Last 10 Encounters:  08/30/12 161 lb 13.1 oz (73.4 kg)  08/26/12 165 lb (74.844 kg)    Usual Body Weight: 165 lb  % Usual Body Weight: 98%  BMI:  Body mass index is 25.34 kg/(m^2). WNL  Estimated Nutritional Needs: Kcal: 1650 - 1800 Protein: 74 - 85 grams Fluid: 1.7 - 2 liters  Skin: intact  Diet Order: Cardiac  EDUCATION NEEDS: -No education needs identified at this time   Intake/Output Summary (Last 24 hours) at 08/31/12 0959 Last data filed at 08/30/12 2100  Gross per 24 hour  Intake    120 ml  Output      0 ml  Net    120 ml    Last  BM: 6/8  Labs:   Recent Labs Lab 08/26/12 1020 08/26/12 1052 08/31/12 0525  NA 138 142 140  K 3.9 3.9 3.7  CL 103 106 104  CO2 25  --  25  BUN 8 7 16   CREATININE 0.87 0.90 0.88  CALCIUM 9.9  --  9.6  GLUCOSE 104* 88 117*   Lipid Panel     Component Value Date/Time   CHOL 195 08/27/2012 0530   TRIG 120 08/27/2012 0530   HDL 43 08/27/2012 0530   CHOLHDL 4.5 08/27/2012 0530   VLDL 24 08/27/2012 0530   LDLCALC 128* 08/27/2012 0530     CBG (last 3)   Recent Labs  08/30/12 1635 08/30/12 2104 08/31/12 0748  GLUCAP 110* 137* 101*    Scheduled Meds: . atorvastatin  40 mg Oral q1800  . citalopram  10 mg Oral Daily  . diclofenac sodium  2 g Topical QID  . dipyridamole-aspirin  1 capsule Oral BID  . enoxaparin (LOVENOX) injection  40 mg Subcutaneous Q24H  . insulin aspart  0-9 Units Subcutaneous TID WC  . lisinopril  2.5 mg Oral Daily    Continuous Infusions:   Past Medical History  Diagnosis Date  . Stroke   . Hypertension   . Diabetes mellitus     No past surgical history on file.  Jarold Motto MS, RD,  LDN Pager: [phone] After-hours pager: [phone]

## 2012-08-31 NOTE — Evaluation (Signed)
Physical Therapy Assessment and Plan  Patient Details  Name: Sophia Rodriguez MRN: [id number] Date of Birth: Nov 10, 1950  PT Diagnosis: Abnormality of gait, Coordination disorder and Muscle weakness Rehab Potential: Good ELOS: 10-12 days   Today's Date: 08/31/2012 Time: 7846-9629  Time Calculation (min): 60 min  Problem List:  Patient Active Problem List   Diagnosis Date Noted  . 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 Stroke   . Hypertension     Past Medical History:  Past Medical History  Diagnosis Date  . Stroke   . Hypertension   . Diabetes mellitus    Past Surgical History: No past surgical history on file.  Assessment & Plan Clinical Impression: Sophia Rodriguez is a 62 y.o. 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 She was admitted on 08/26/12 with 24 hour history of difficulty walking, decrease in balance as well as increased right sided weakness. MRI of the brain showed acute nonhemorrhagic infarct at the junction of the right thalamus and posterior limb of the right internal capsule as well as remote moderate to large size infarct medial aspect left frontal and parietal lobes as well as left globus pallidus and remote left paracentral small infarct. MRA of the head with atherosclerotic type changes. MRA of the neck without significant stenosis. Echocardiogram with EF 60-65% and source of emboli. Carotid Dopplers with no ICA stenosis. Neurology recommended changing patient to aggrenox for thrombotic stroke due to SVD. Patient with left upper sided weakness with decreased coordination and flat affect. Therapies initiated and she was noted to have decrease in balance reactions as well as issues with motor planning and initiation. Therapy team and MD recommended CIR and patient admitted today. Patient transferred to CIR on 08/30/2012 .    Patient currently requires min with mobility secondary to muscle weakness, impaired timing and sequencing, decreased coordination and decreased motor planning, decreased motor planning, decreased initiation and delayed processing and decreased standing balance and decreased balance strategies.  Prior to hospitalization, patient was modified independent  with mobility and lived with Alone in a Apartment home.  Home access is  Level entry.  Patient will benefit from skilled PT intervention to maximize safe functional mobility, minimize fall risk and decrease caregiver burden for planned discharge home with intermittent assist.  Anticipate patient will benefit from follow up [facility] at discharge.  PT - End of Session Activity Tolerance: Tolerates 30+ min activity with multiple rests Endurance Deficit: No PT Assessment Rehab Potential: Good Barriers to Discharge: Decreased caregiver support PT Plan PT Intensity: Minimum of 1-2 x/day ,45 to 90 minutes PT Frequency: 5 out of 7 days PT Duration Estimated Length of Stay: 10-12 days PT Treatment/Interventions: Ambulation/gait training;Balance/vestibular training;Cognitive remediation/compensation;Community reintegration;Discharge planning;Neuromuscular re-education;Functional mobility training;DME/adaptive equipment instruction;Disease management/prevention;Pain management;Patient/family education;Psychosocial support;Splinting/orthotics;UE/LE Coordination activities;UE/LE Strength taining/ROM;Therapeutic Exercise;Therapeutic Activities;Stair training;Wheelchair propulsion/positioning PT Recommendation Recommendations for Other Services: Speech consult Follow Up Recommendations: Home health PT;Outpatient PT Patient destination: Home Equipment Recommended: None recommended by PT Equipment Details: DME assessment ongoing, recommendations TBD upon discharge, but patient appears to own any necessary equipment  Skilled Therapeutic Intervention Skilled  therapeutic intervention initiated after completion of evaluation. Gait training with R HHA x25' with min A, also requires manual facilitation for weight shifts, but patient with significantly decreased step/stride length and shuffling. Patient returned to room and left seated in wheelchair with all needs within reach.  PT Evaluation Precautions/Restrictions Precautions Precautions: Fall Precaution  Comments: 2-3 falls at home within last month per pt.  Restrictions Weight Bearing Restrictions: No General Chart Reviewed: Yes Family/Caregiver Present: No  Pain Pain Assessment Pain Assessment: No/denies pain Pain Score: 0-No pain Home Living/Prior Functioning Home Living Lives With: Alone Available Help at Discharge: Family;Available PRN/intermittently (daughter works during the day) Type of Home: Apartment Home Access: Level entry Home Layout: One level Bathroom Shower/Tub: Engineer, manufacturing systems: Handicapped height Bathroom Accessibility: Yes How Accessible: Accessible via walker Home Adaptive Equipment: Walker - rolling;Straight cane;Wheelchair - manual;Grab bars around toilet;Hand-held shower hose;Grab bars in shower Additional Comments: Used SPC PTA Prior Function Level of Independence: Requires assistive device for independence Able to Take Stairs?: No Driving: No Vocation: On disability Vision/Perception  Vision - History Baseline Vision: Wears glasses for distance only Patient Visual Report: No change from baseline Vision - Assessment Convergence: Within functional limits Visual Fields: No apparent deficits  Cognition Overall Cognitive Status: Impaired/Different from baseline Arousal/Alertness: Awake/alert Orientation Level: Oriented X4 Attention: Selective Sustained Attention: Appears intact Selective Attention: Appears intact Memory: Impaired Memory Impairment: Decreased recall of new information Awareness: Appears intact Problem Solving:  Impaired Problem Solving Impairment: Functional complex Executive Function: Sequencing Sequencing: Impaired Sequencing Impairment: Functional basic;Functional complex Safety/Judgment: Appears intact Sensation Sensation Light Touch: Appears Intact Hot/Cold: Appears Intact Proprioception: Appears Intact Additional Comments: Proprioception intact at B great toes and B ankles Coordination Gross Motor Movements are Fluid and Coordinated: No Fine Motor Movements are Fluid and Coordinated: No Coordination and Movement Description: Decreased speed and accuracy with rapid alternating movements with L UE and L LE Finger Nose Finger Test: decreased speed and accuracy with LUE Heel Shin Test: dysmetria/undershooting with L LE Motor  Motor Motor: Within Functional Limits Motor - Skilled Clinical Observations: No clonus noted B LE  Mobility Bed Mobility Bed Mobility: Rolling Left;Supine to Sit;Sit to Supine;Sitting - Scoot to [place] of Bed;Rolling Right Rolling Right: 4: Min assist Rolling Right Details: Verbal cues for sequencing;Verbal cues for technique;Verbal cues for precautions/safety;Manual facilitation for weight shifting Rolling Left: 4: Min assist Rolling Left Details: Verbal cues for sequencing;Verbal cues for technique;Verbal cues for precautions/safety;Manual facilitation for weight shifting Supine to Sit: 5: Supervision;HOB flat Supine to Sit Details: Verbal cues for sequencing;Verbal cues for precautions/safety Sitting - Scoot to Edge of Bed: 5: Supervision Sitting - Scoot to [place] of Bed Details: Verbal cues for sequencing;Verbal cues for precautions/safety Sit to Supine: 3: Mod assist;HOB flat Sit to Supine - Details: Verbal cues for sequencing;Verbal cues for technique;Verbal cues for precautions/safety;Manual facilitation for weight shifting Sit to Supine - Details (indicate cue type and reason): Requires lifting for B LEs Transfers Sit to Stand: 4: Min assist;With upper  extremity assist;From chair/3-in-1;From bed;With armrests Sit to Stand Details: Verbal cues for sequencing;Verbal cues for technique;Verbal cues for precautions/safety;Manual facilitation for weight shifting Stand to Sit: 4: Min assist;To chair/3-in-1;To toilet Stand to Sit Details (indicate cue type and reason): Verbal cues for sequencing;Verbal cues for technique;Verbal cues for precautions/safety;Manual facilitation for weight shifting Stand Pivot Transfers: 4: Min assist;With armrests Stand Pivot Transfer Details: Tactile cues for initiation;Verbal cues for sequencing;Verbal cues for technique;Verbal cues for precautions/safety;Manual facilitation for weight shifting Stand Pivot Transfer Details (indicate cue type and reason): Patient with decreased initiation and motor planning as well as decreased speed of movements during stand pivot transfers. Locomotion  Ambulation Ambulation: Yes Ambulation/Gait Assistance: 4: Min assist Ambulation Distance (Feet): 95 Feet; 25' x1 Assistive device: Rolling walker;1 person hand held assist Ambulation/Gait Assistance Details: Tactile cues for initiation;Verbal  cues for precautions/safety;Verbal cues for gait pattern;Manual facilitation for weight shifting Ambulation/Gait Assistance Details: Patient instructed in gait training 95'x1 with RW and minA required for lateral weight shifts; gait and step/stride length improves with manual facilitation for weight shifts. Gait training with R HHA x25' with min A, also requires manual facilitation for weight shifts, but patient with significantly decreased step/stride length and shuffling. Gait Gait: Yes Gait Pattern: Impaired Gait Pattern: Step-to pattern;Decreased step length - right;Decreased stance time - right;Decreased weight shift to right;Decreased dorsiflexion - right;Wide base of support;Trunk flexed;Shuffle Stairs / Additional Locomotion Stairs: Yes Stairs Assistance: 4: Min assist Stairs Assistance  Details: Verbal cues for sequencing;Tactile cues for initiation;Verbal cues for technique;Manual facilitation for weight shifting;Verbal cues for precautions/safety Stair Management Technique: Two rails;Step to pattern;Forwards Number of Stairs: 5 Height of Stairs: 6 Wheelchair Mobility Wheelchair Mobility: Yes Wheelchair Assistance: 2: Max Chiropodist Details: Tactile cues for placement;Verbal cues for sequencing;Verbal cues for technique;Verbal cues for Engineer, drilling: Both upper extremities Wheelchair Parts Management: Needs assistance Distance: 30  Trunk/Postural Assessment  Cervical Assessment Cervical Assessment: Within Functional Limits Thoracic Assessment Thoracic Assessment: Within Functional Limits Lumbar Assessment Lumbar Assessment: Within Functional Limits Postural Control Postural Control: Within Functional Limits  Balance Balance Balance Assessed: Yes Static Sitting Balance Static Sitting - Balance Support: Feet supported;No upper extremity supported Static Sitting - Level of Assistance: 5: Stand by assistance Static Sitting - Comment/# of Minutes: 2 min edge of mat Static Standing Balance Static Standing - Balance Support: Bilateral upper extremity supported Static Standing - Level of Assistance: 4: Min assist Dynamic Standing Balance Dynamic Standing - Balance Support: Bilateral upper extremity supported;During functional activity;Right upper extremity supported Dynamic Standing - Level of Assistance: 4: Min assist Dynamic Standing - Balance Activities: Lateral lean/weight shifting;Forward lean/weight shifting Extremity Assessment  RUE Assessment RUE Assessment: Within Functional Limits LUE Assessment LUE Assessment: Exceptions to [facility] LUE Strength Gross Grasp: Functional (decreased grip strength when compaired to R) RLE Assessment RLE Assessment: Within Functional Limits (Grossly 4-/5) LLE Assessment LLE  Assessment: Exceptions to [facility] LLE Strength LLE Overall Strength: Deficits LLE Overall Strength Comments: Grossly 3+ to 4-/5  FIM:  FIM - Bed/Chair Transfer Bed/Chair Transfer Assistive Devices: Arm rests;Walker Bed/Chair Transfer: 3: Bed > Chair or W/C: Mod A (lift or lower assist) FIM - Locomotion: Wheelchair Distance: 30 Locomotion: Wheelchair: 1: Travels less than 50 ft with maximal assistance (Pt: 25 - 49%) FIM - Locomotion: Ambulation Locomotion: Ambulation Assistive Devices: Designer, industrial/product Ambulation/Gait Assistance: 4: Min assist Locomotion: Ambulation: 2: Travels 50 - 149 ft with minimal assistance (Pt.>75%) FIM - Locomotion: Stairs Locomotion: Building control surveyor: Hand rail - 2 Locomotion: Stairs: 2: Up and Down 4 - 11 stairs with minimal assistance (Pt.>75%)   Refer to Care Plan for Long Term Goals  Recommendations for other services: None  Discharge Criteria: Patient will be discharged from PT if patient refuses treatment 3 consecutive times without medical reason, if treatment goals not met, if there is a change in medical status, if patient makes no progress towards goals or if patient is discharged from hospital.  The above assessment, treatment plan, treatment alternatives and goals were discussed and mutually agreed upon: by patient  [name] Johnnae Impastato, PT, DPT  08/31/2012, 11:59 AM

## 2012-08-31 NOTE — Progress Notes (Signed)
Occupational Therapy Session Note  Patient Details  Name: Sophia Rodriguez MRN: [id number] Date of Birth: Sep 20, 1950  Today's Date: 08/31/2012 Time: 1400-1430 Time Calculation (min): 30 min  Short Term Goals: Week 1:  OT Short Term Goal 1 (Week 1): Pt will complete LB dressing with supervision for standing balance and no more then 3 verbal cue for sequencing/tech OT Short Term Goal 2 (Week 1): Pt will complete bathing task with supervision and no more then 3 verbal cue for sequencing OT Short Term Goal 3 (Week 1): Pt complete toileting transfer with min assist and 1 verbal cue for motor planning  OT Short Term Goal 4 (Week 1): Pt will complete shower transfer with min assist OT Short Term Goal 5 (Week 1): Pt willl sequence and initiate morning self-care tasks with no more then 2 verbal cues.  Skilled Therapeutic Interventions/Progress Updates:  Therapy session focused on motor planning and dynamic standing balance during functional transfers. Pt completed stand pivot transfer x5 with RW and min assist with increased time d/t motor planning. Pt with increased difficulty motor planning to move RLE. Educated pt on importance of repetition with motor planning deficits and pt reported understanding. Pt's transfers improved slightly during therapy session with repetition. Good recall of hand placement with sit<>stand as she required cues approx 25% of time. Pt with slight lob posteriorly on 1 occasion with sit<>stand. Pt's reaction was immediately to reach for arm rest behind her. Asked pt on why she thought she lost her balance. Able to problem solve and determine she was thinking about moving her feet before getting her balance. Pt required short rest breaks between each transfer.  Therapy Documentation Precautions:  Precautions Precautions: Fall Precaution Comments: 2-3 falls at home within last month per pt.  Restrictions Weight Bearing Restrictions: No General:   Vital Signs:   Pain: No  c/o pain during therapy session.   See FIM for current functional status  Therapy/Group: Individual Therapy  Daneil Dan 08/31/2012, 3:27 PM

## 2012-08-31 NOTE — Care Management Note (Signed)
[facility] Individual Statement of Services  Patient Name:  Sophia Rodriguez  Date:  08/31/2012  Welcome to the [facility].  Our goal is to provide you with an individualized program based on your diagnosis and situation, designed to meet your specific needs.  With this comprehensive rehabilitation program, you will be expected to participate in at least 3 hours of rehabilitation therapies Monday-Friday, with modified therapy programming on the weekends.  Your rehabilitation program will include the following services:  Physical Therapy (PT), Occupational Therapy (OT), Speech Therapy (ST), 24 hour per day rehabilitation nursing, Case Management ( Social Worker), Rehabilitation Medicine, Nutrition Services and Pharmacy Services  Weekly team conferences will be held on Wednesday to discuss your progress.  Your Social Worker will talk with you frequently to get your input and to update you on team discussions.  Team conferences with you and your family in attendance may also be held.  Expected length of stay: 10-12 days Overall anticipated outcome: Supervision with cues  Depending on your progress and recovery, your program may change. Your Social Worker will coordinate services and will keep you informed of any changes. Your Child psychotherapist names and contact numbers are listed  below.  The following services may also be recommended but are not provided by the [facility]:  Home Health Rehabiltiation Services  Outpatient Rehabilitatation Servives   Arrangements will be made to provide these services after discharge if needed.  Arrangements include referral to agencies that provide these services.  Your insurance has been verified to be:  Medicare & BCBS Your primary doctor is:  Dr Concepcion Elk  Pertinent information will be shared with your doctor and your insurance company.  Social Worker:  Dossie Der, [place] [phone]  Information  discussed with and copy given to patient by: Lucy Chris, 08/31/2012, 9:52 AM

## 2012-08-31 NOTE — Progress Notes (Signed)
Patient ID: Sophia Rodriguez, female   DOB: 1951-03-20, 62 y.o.   MRN: [id number] Subjective/Complaints: 62 y.o. 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 She was admitted on 08/26/12 with 24 hour history of difficulty walking, decrease in balance as well as increased right sided weakness. MRI of the brain showed acute nonhemorrhagic infarct at the junction of the right thalamus and posterior limb of the right internal capsule as well as remote moderate to large size infarct medial aspect left frontal and parietal lobes as well as left globus pallidus and remote left paracentral small infarct. MRA of the head with atherosclerotic type changes. MRA of the neck without significant stenosis. Echocardiogram with EF 60-65% and source of emboli. Carotid Dopplers with no ICA stenosis. Neurology recommended changing patient to aggrenox for thrombotic stroke due to SVD Slept ok last noc, no BM today thus far Review of Systems  Neurological: Positive for focal weakness.  All other systems reviewed and are negative.    Objective: Vital Signs: Blood pressure 132/80, pulse 70, temperature 98.6 F (37 C), temperature source Oral, resp. rate 17, weight 73.4 kg (161 lb 13.1 oz), SpO2 100.00%. No results found. Results for orders placed during the hospital encounter of 08/30/12 (from the past 72 hour(s))  GLUCOSE, CAPILLARY     Status: Abnormal   Collection Time    08/30/12  4:35 PM      Result Value Range   Glucose-Capillary 110 (*) 70 - 99 mg/dL  GLUCOSE, CAPILLARY     Status: Abnormal   Collection Time    08/30/12  9:04 PM      Result Value Range   Glucose-Capillary 137 (*) 70 - 99 mg/dL  CBC WITH DIFFERENTIAL     Status: Abnormal   Collection Time    08/31/12  5:25 AM      Result Value Range   WBC 4.9  4.0 - 10.5 K/uL   RBC 5.29 (*) 3.87 - 5.11 MIL/uL   Hemoglobin  13.6  12.0 - 15.0 g/dL   HCT 40.9  81.1 - 91.4 %   MCV 79.4  78.0 - 100.0 fL   MCH 25.7 (*) 26.0 - 34.0 pg   MCHC 32.4  30.0 - 36.0 g/dL   RDW 78.2  95.6 - 21.3 %   Platelets 229  150 - 400 K/uL   Neutrophils Relative % 62  43 - 77 %   Neutro Abs 3.0  1.7 - 7.7 K/uL   Lymphocytes Relative 29  12 - 46 %   Lymphs Abs 1.4  0.7 - 4.0 K/uL   Monocytes Relative 8  3 - 12 %   Monocytes Absolute 0.4  0.1 - 1.0 K/uL   Eosinophils Relative 1  0 - 5 %   Eosinophils Absolute 0.1  0.0 - 0.7 K/uL   Basophils Relative 0  0 - 1 %   Basophils Absolute 0.0  0.0 - 0.1 K/uL      Nursing note and vitals reviewed.  Constitutional: She is oriented to person, place, and time. She appears well-developed and well-nourished.  HENT:  Head: Normocephalic and atraumatic.  Eyes: Pupils are equal, round, and reactive to light.  Neck: Normal range of motion. Neck supple.  Cardiovascular: Normal rate and regular rhythm.  Pulmonary/Chest: Effort normal and breath sounds normal. No respiratory distress. She has no wheezes.  Abdominal: Soft. Bowel sounds are normal.  Musculoskeletal: She exhibits  no edema and no tenderness.  Neurological: She is alert and oriented to person, place, and time.  Soft spoken. Speech clear. Some delay in initiation but able to followcommands without difficulty. LUE weakness with decrease in fine motor control. RLE with minimal weakness.  Skin: Skin is warm and dry.  Psychiatric: Her speech is normal and behavior is normal. Thought content normal. Her mood appears appropriate. Cognition and memory are normal.  Motor strength: 4/5 in the right deltoid, biceps, triceps, grip, hip flexor, knee extensors, ankle dorsiflexor plantar flexor  4 minus/5 in the left deltoid, biceps, triceps, grip, hip flexor, knee extensors, ankle dorsiflexor and plantar flexor  Sensation intact to light touch and proprioception in bilateral hands and feet with the exception of left little finger  Visual fields  intact to confrontation testing  Cerebellar testing no evidence of ataxia on finger nose to finger and heel-to-shin testing are   Assessment/Plan: 1. 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 Physiatrist is providing close team supervision and 24 hour management of active medical problems listed below. Physiatrist and rehab team continue to assess barriers to discharge/monitor patient progress toward functional and medical goals. FIM:             FIM - Banker Devices: Arm rests Bed/Chair Transfer: 5: Supine > Sit: Supervision (verbal cues/safety issues);5: Sit > Supine: Supervision (verbal cues/safety issues);4: Bed > Chair or W/C: Min A (steadying Pt. > 75%);4: Chair or W/C > Bed: Min A (steadying Pt. > 75%)        Expression Expression Mode: Verbal Expression: 6-Expresses complex ideas: With extra time/assistive device  Social Interaction Social Interaction: 6-Interacts appropriately with others with medication or extra time (anti-anxiety, antidepressant).  Problem Solving Problem Solving: 6-Solves complex problems: With extra time  Memory Memory: 6-More than reasonable amt of time  Medical Problem List and Plan:  1. DVT Prophylaxis/Anticoagulation: Pharmaceutical: Lovenox  2. Pain Management: Will use tylenol prn for pain. Add voltaren gel for left wrist pain.  3. Mood: Flat affect with underlying anxiety noted. Will continue celexa and klonopin. Provide ego support. Will have LCSW follow up for evaluation.  4. Neuropsych: This patient is capable of making decisions on his/her own behalf.  5. DM type 2: Monitor BS with AC/HS checks. Off metformin.  6. HTN: Will monitor with bid checks. Will start low dose ace for better  control will close monitoring for hypotension. .  7. Dyslipidemia: LDL--128. Will Lipitor in place of Zocor.      LOS (Days) 1 A FACE TO FACE EVALUATION WAS PERFORMED  [name],ANDREW E 08/31/2012, 7:09 AM

## 2012-08-31 NOTE — Progress Notes (Signed)
Patient information reviewed and entered into eRehab system by Renly Guedes, RN, CRRN, PPS Coordinator.  Information including medical coding and functional independence measure will be reviewed and updated through discharge.     Per nursing patient was given "Data Collection Information Summary for Patients in Inpatient Rehabilitation Facilities with attached "Privacy Act Statement-Health Care Records" upon admission.  

## 2012-08-31 NOTE — Evaluation (Signed)
Speech Language Pathology Assessment and Plan  Patient Details  Name: Sophia Rodriguez MRN: [id number] Date of Birth: 07/26/1950  SLP Diagnosis: Speech and Language deficits;Cognitive Impairments  Rehab Potential: Good ELOS: 10-12 days  Today's Date: 08/31/2012 Time: 0272-5366 Time Calculation (min): 50 min  Problem List:  Patient Active Problem List   Diagnosis Date Noted  . 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 Stroke   . Hypertension    Past Medical History:  Past Medical History  Diagnosis Date  . Stroke   . Hypertension   . Diabetes mellitus    Past Surgical History: No past surgical history on file.  Assessment / Plan / Recommendation Clinical Impression  Sophia Rodriguez is a 62 y.o. 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 She was admitted on 08/26/12 with 24 hour history of difficulty walking, decrease in balance as well as increased right sided weakness. MRI of the brain showed acute nonhemorrhagic infarct at the junction of the right thalamus and posterior limb of the right internal capsule as well as remote moderate to large size infarct medial aspect left frontal and parietal lobes as well as left globus pallidus and remote left paracentral small infarct. MRA of the head with atherosclerotic type changes. MRA of the neck without significant stenosis. Echocardiogram with EF 60-65% and source of emboli. Carotid Dopplers with no ICA stenosis. Neurology recommended changing patient to aggrenox for thrombotic stroke due to SVD. Patient with left upper sided weakness with decreased coordination and flat affect. Therapies initiated and she was noted to have decrease in balance reactions as well as issues with motor planning and initiation. Therapy team and MD recommended CIR and patient admitted 08/30/12.  Cognitive-linguistic  evaluation reveals mild deficits impacting moderately-high level problem solving.  While, baseline function is unknown patient was living alone reportedly managing finances and medications.  Now patient demonstrates slowed processing, anomia in conversation, poor recall of new information and a flat affect with decreased initiation.  As a result, it is recommended that patient receive skilled SLP services to address safety with self-care tasks to maximize functional independence and reduce burden of care prior to discharge home with assist.      Skilled Treatment  SLP Assessment  SLP initiated skilled treatment during today's session by completing a medication management chart to assist patient with recall and carryover of information. Patient unable to independently name any previous or current medications prior to creation of chart.  Patient will need skilled Speech Lanaguage Pathology Services during CIR admission    Recommendations  Patient destination: Home Follow up Recommendations:  (TBD) Equipment Recommended: None recommended by SLP    SLP Frequency 5 out of 7 days   SLP Treatment/Interventions Cognitive remediation/compensation;Cueing hierarchy;Functional tasks;Internal/external aids;Medication managment;Patient/family education;Speech/Language facilitation;Therapeutic Exercise;Therapeutic Activities    Pain Pain Assessment Pain Assessment: No/denies pain Pain Score: 0-No pain Prior Functioning Cognitive/Linguistic Baseline: Information not available Education: one year of grad school, 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 SLP Short Term Goal 2 (Week 1): Patient will utilize external aids to assist with recall of daily information with Supervision level  verbal cues. SLP Short Term Goal 3 (Week 1): Patient will demonstrate moderately complex problem solving  while completing familiar tasks with Min assist verbal cues.  See FIM for current functional status Refer to Care Plan for Long Term Goals  Recommendations for other services: None  Discharge Criteria: Patient will be discharged from SLP if patient refuses treatment 3 consecutive times without medical reason, if treatment goals not met, if there is a change in medical status, if patient makes no progress towards goals or if patient is discharged from hospital.  The above assessment, treatment plan, treatment alternatives and goals were discussed and mutually agreed upon: by patient  Sophia Rodriguez, M.A., CCC-SLP [phone]  Osby Sweetin 08/31/2012, 1:20 PM

## 2012-08-31 NOTE — Progress Notes (Signed)
Social Work Assessment and Plan Social Work Assessment and Plan  Patient Details  Name: Sophia Rodriguez MRN: [id number] Date of Birth: 03/11/1951  Today's Date: 08/31/2012  Problem List:  Patient Active Problem List   Diagnosis Date Noted  . 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 Stroke   . Hypertension    Past Medical History:  Past Medical History  Diagnosis Date  . Stroke   . Hypertension   . Diabetes mellitus    Past Surgical History: No past surgical history on file. Social History:  reports that she quit smoking about 5 years ago. She does not have any smokeless tobacco history on file. She reports that she does not drink alcohol or use illicit drugs.  Family / Support Systems Marital Status: Single Patient Roles: Parent Children: Morley Kos  [phone] Other Supports: Son in [place] Anticipated Caregiver: Self and daughter checking in on Ability/Limitations of Caregiver: Daughter works and only can provide intermittent assist Caregiver Availability: Intermittent Family Dynamics: Close with both children and see's as often as possible.  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  She has had two others in the past and has recovered from them, so she is hopeful she will do so again.  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 Recent Psychosocial Issues: Other medical  issues-past CVA's and falls at home Pyschiatric History: History of depression-takes meds for and feels they help her.  Deferred depression screen due to tired and felt it was not necessary.  WIll monitor while here and see if would benefit from [facility] psych while here. Substance Abuse History: No issues  Patient / Family Perceptions, Expectations & Goals Pt/Family understanding of illness & functional limitations: Pt is able to explain her strokeand deficits.  She is encouraged by the progress he has made thus far and is optimistice she will continue to make more progress while here. Premorbid pt/family roles/activities: Mother, Grandmother, Retiree, [employer], etc Anticipated changes in roles/activities/participation: resume Pt/family expectations/goals: Pt states; " I need to get independent before I leave here, that is my goal."  [facility] confident she will reach tis goal while here.  Community Resources [employer]: Other (Comment) ([facility] PTA) Premorbid Home Care/DME Agencies: None Transportation available at discharge: Public transportation or her daughter Resource referrals recommended: Support group (specify) (CVA Support group)  Discharge Planning Living Arrangements: Alone Support Systems: Children;Friends/neighbors;Church/faith community Type of Residence: Private residence Insurance Resources: Administrator (specify) Herbalist) Financial Resources: SSD Financial Screen Referred: No Living Expenses: Rent Money Management: Patient Do you have any problems obtaining your medications?: No Home Management: Self Patient/Family Preliminary Plans: Return home and her daughter can provide intermittent assist.  Pt wants to resume her OP therapy at discharge.  Will follow to make sure able to reach mod/i level goals and complete SCAT application for assist with transportation. Social Work Anticipated Follow Up Needs: HH/OP;Support Group  Clinical  Impression Pleasant female who is motivated to reach independent level prior to discharge.  Her daughter is supportive but  can only provide intermittent assist.   Pt has been through this before and recovered so she is confident she can overcome her deficits again.  Lucy Chris 08/31/2012, 10:08 AM

## 2012-09-01 ENCOUNTER — Inpatient Hospital Stay (HOSPITAL_COMMUNITY): Payer: Medicare Other

## 2012-09-01 ENCOUNTER — Encounter (HOSPITAL_COMMUNITY): Payer: Medicare Other | Admitting: Physical Therapy

## 2012-09-01 ENCOUNTER — Inpatient Hospital Stay (HOSPITAL_COMMUNITY): Payer: Medicare Other | Admitting: *Deleted

## 2012-09-01 ENCOUNTER — Inpatient Hospital Stay (HOSPITAL_COMMUNITY): Payer: Medicare Other | Admitting: Speech Pathology

## 2012-09-01 LAB — GLUCOSE, CAPILLARY
Glucose-Capillary: 109 mg/dL — ABNORMAL HIGH (ref 70–99)
Glucose-Capillary: 88 mg/dL (ref 70–99)
Glucose-Capillary: 167 mg/dL — ABNORMAL HIGH (ref 70–99)

## 2012-09-01 NOTE — Progress Notes (Signed)
Occupational Therapy Session Note  Patient Details  Name: Sophia Rodriguez MRN: [id number] Date of Birth: 1950-10-18  Today's Date: 09/01/2012 Time: 0830-0930 Time Calculation (min): 60 min  Short Term Goals: Week 1:  OT Short Term Goal 1 (Week 1): Pt will complete LB dressing with supervision for standing balance and no more then 3 verbal cue for sequencing/tech OT Short Term Goal 2 (Week 1): Pt will complete bathing task with supervision and no more then 3 verbal cue for sequencing OT Short Term Goal 3 (Week 1): Pt complete toileting transfer with min assist and 1 verbal cue for motor planning  OT Short Term Goal 4 (Week 1): Pt will complete shower transfer with min assist OT Short Term Goal 5 (Week 1): Pt willl sequence and initiate morning self-care tasks with no more then 2 verbal cues.  Skilled Therapeutic Interventions/Progress Updates:    Therapy session focused on motor planing during functional transfers, sequencing of tasks, and initiation during ADL retraining. Pt engaging in eating task upon arrival. Pt had butter container open and attempting to shake butter from tube. After several attempts OT provided verbal cue to use utensil to assist with scooping butter from container. Pt completed stand pivot transfer w/c<>shower with steadying assist and verbal cues for hand placement. During bathing task pt stood with steadying assist to complete peri care. Required vc's to complete hygiene as she forgot after standing. Required verbal cues 25% of time for feet placement prior to sit<>stand transfers. Pt able to correct feet placement while standing standing during oral care to increase balance without cues. Pt with decreased initiation throughout dressing and bathing task as pt would pause between each part and required verbal cue of "what do you need next" to initiate task. Required increased time for LB dressing and steadying assist when standing to manage around waist. Ambulated approx 8 ft  w/c<>toilet with min assist using RW to practice toilet transfer.   Therapy Documentation Precautions:  Precautions Precautions: Fall Precaution Comments: 2-3 falls at home within last month per pt.  Restrictions Weight Bearing Restrictions: No General:   Vital Signs: Therapy Vitals Temp: 97.7 F (36.5 C) Temp src: Oral Pulse Rate: 90 Resp: 19 BP: 126/66 mmHg Patient Position, if appropriate: Lying Oxygen Therapy SpO2: 100 % O2 Device: None (Room air) Pain: No c/o pain during therapy session.   See FIM for current functional status  Therapy/Group: Individual Therapy  Daneil Dan 09/01/2012, 10:39 AM

## 2012-09-01 NOTE — Progress Notes (Signed)
Patient ID: Sophia Rodriguez, female   DOB: [birth year], 62 y.o.   MRN: [id number] Subjective/Complaints: 62 y.o. 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 She was admitted on 08/26/12 with 24 hour history of difficulty walking, decrease in balance as well as increased right sided weakness. MRI of the brain showed acute nonhemorrhagic infarct at the junction of the right thalamus and posterior limb of the right internal capsule as well as remote moderate to large size infarct medial aspect left frontal and parietal lobes as well as left globus pallidus and remote left paracentral small infarct. MRA of the head with atherosclerotic type changes. MRA of the neck without significant stenosis. Echocardiogram with EF 60-65% and source of emboli. Carotid Dopplers with no ICA stenosis. Neurology recommended changing patient to aggrenox for thrombotic stroke due to SVD Slept ok last noc, no BM today thus far Review of Systems  Neurological: Positive for focal weakness.  All other systems reviewed and are negative.    Objective: Vital Signs: Blood pressure 126/66, pulse 90, temperature 97.7 F (36.5 C), temperature source Oral, resp. rate 19, weight 73.4 kg (161 lb 13.1 oz), SpO2 100.00%. No results found. Results for orders placed during the hospital encounter of 08/30/12 (from the past 72 hour(s))  GLUCOSE, CAPILLARY     Status: Abnormal   Collection Time    08/30/12  4:35 PM      Result Value Range   Glucose-Capillary 110 (*) 70 - 99 mg/dL  GLUCOSE, CAPILLARY     Status: Abnormal   Collection Time    08/30/12  9:04 PM      Result Value Range   Glucose-Capillary 137 (*) 70 - 99 mg/dL  CBC WITH DIFFERENTIAL     Status: Abnormal   Collection Time    08/31/12  5:25 AM      Result Value Range   WBC 4.9  4.0 - 10.5 K/uL   RBC 5.29 (*) 3.87 - 5.11 MIL/uL   Hemoglobin  13.6  12.0 - 15.0 g/dL   HCT 40.9  81.1 - 91.4 %   MCV 79.4  78.0 - 100.0 fL   MCH 25.7 (*) 26.0 - 34.0 pg   MCHC 32.4  30.0 - 36.0 g/dL   RDW 78.2  95.6 - 21.3 %   Platelets 229  150 - 400 K/uL   Neutrophils Relative % 62  43 - 77 %   Neutro Abs 3.0  1.7 - 7.7 K/uL   Lymphocytes Relative 29  12 - 46 %   Lymphs Abs 1.4  0.7 - 4.0 K/uL   Monocytes Relative 8  3 - 12 %   Monocytes Absolute 0.4  0.1 - 1.0 K/uL   Eosinophils Relative 1  0 - 5 %   Eosinophils Absolute 0.1  0.0 - 0.7 K/uL   Basophils Relative 0  0 - 1 %   Basophils Absolute 0.0  0.0 - 0.1 K/uL  COMPREHENSIVE METABOLIC PANEL     Status: Abnormal   Collection Time    08/31/12  5:25 AM      Result Value Range   Sodium 140  135 - 145 mEq/L   Potassium 3.7  3.5 - 5.1 mEq/L   Chloride 104  96 - 112 mEq/L   CO2 25  19 - 32 mEq/L   Glucose, Bld 117 (*) 70 - 99 mg/dL   BUN 16  6 - 23  mg/dL   Creatinine, Ser 1.61  0.50 - 1.10 mg/dL   Calcium 9.6  8.4 - 09.6 mg/dL   Total Protein 7.3  6.0 - 8.3 g/dL   Albumin 3.6  3.5 - 5.2 g/dL   AST 20  0 - 37 U/L   ALT 16  0 - 35 U/L   Alkaline Phosphatase 80  39 - 117 U/L   Total Bilirubin 0.4  0.3 - 1.2 mg/dL   GFR calc non Af Amer 70 (*) >90 mL/min   GFR calc Af Amer 81 (*) >90 mL/min   Comment:            The eGFR has been calculated     using the CKD EPI equation.     This calculation has not been     validated in all clinical     situations.     eGFR's persistently     <90 mL/min signify     possible Chronic Kidney Disease.  GLUCOSE, CAPILLARY     Status: Abnormal   Collection Time    08/31/12  7:48 AM      Result Value Range   Glucose-Capillary 101 (*) 70 - 99 mg/dL  GLUCOSE, CAPILLARY     Status: Abnormal   Collection Time    08/31/12 11:53 AM      Result Value Range   Glucose-Capillary 131 (*) 70 - 99 mg/dL  GLUCOSE, CAPILLARY     Status: Abnormal   Collection Time    08/31/12  4:20 PM      Result Value Range   Glucose-Capillary 134 (*) 70 - 99 mg/dL  GLUCOSE,  CAPILLARY     Status: None   Collection Time    08/31/12  9:55 PM      Result Value Range   Glucose-Capillary 97  70 - 99 mg/dL  GLUCOSE, CAPILLARY     Status: Abnormal   Collection Time    09/01/12  7:36 AM      Result Value Range   Glucose-Capillary 124 (*) 70 - 99 mg/dL      Nursing note and vitals reviewed.  Constitutional: She is oriented to person, place, and time. She appears well-developed and well-nourished.  HENT:  Head: Normocephalic and atraumatic.  Eyes: Pupils are equal, round, and reactive to light.  Neck: Normal range of motion. Neck supple.  Cardiovascular: Normal rate and regular rhythm.  Pulmonary/Chest: Effort normal and breath sounds normal. No respiratory distress. She has no wheezes.  Abdominal: Soft. Bowel sounds are normal.  Musculoskeletal: She exhibits no edema and no tenderness.  Neurological: She is alert and oriented to person, place, and time.  Soft spoken. Speech clear. Some delay in initiation but able to followcommands without difficulty. LUE weakness with decrease in fine motor control. RLE with minimal weakness.  Skin: Skin is warm and dry.  Psychiatric: Her speech is normal and behavior is normal. Thought content normal. Her mood appears appropriate. Cognition and memory are normal.  Motor strength: 4/5 in the right deltoid, biceps, triceps, grip, hip flexor, knee extensors, ankle dorsiflexor plantar flexor  4 minus/5 in the left deltoid, biceps, triceps, grip, hip flexor, knee extensors, ankle dorsiflexor and plantar flexor  Sensation intact to light touch and proprioception in bilateral hands and feet with the exception of left little finger  Visual fields intact to confrontation testing  Cerebellar testing no evidence of ataxia on finger nose to finger and heel-to-shin testing are   Assessment/Plan: 1. Functional deficits secondary to acute  nonhemorrhagic infarct at the junction of the right thalamus and posterior limb of the right internal  capsule  which require 3+ hours per day of interdisciplinary therapy in a comprehensive inpatient rehab setting. Physiatrist is providing close team supervision and 24 hour management of active medical problems listed below. Physiatrist and rehab team continue to assess barriers to discharge/monitor patient progress toward functional and medical goals. Team conference today please see physician documentation under team conference tab, met with team face-to-face to discuss problems,progress, and goals. Formulized individual treatment plan based on medical history, underlying problem and comorbidities. FIM: FIM - Bathing Bathing Steps Patient Completed: Right Arm;Right upper leg;Left upper leg;Abdomen;Left Arm;Buttocks;Front perineal area;Chest;Right lower leg (including foot);Left lower leg (including foot) Bathing: 4: Steadying assist  FIM - Upper Body Dressing/Undressing Upper body dressing/undressing steps patient completed: Thread/unthread right bra strap;Thread/unthread left bra strap;Hook/unhook bra;Thread/unthread left sleeve of pullover shirt/dress;Thread/unthread right sleeve of pullover shirt/dresss;Put head through opening of pull over shirt/dress;Pull shirt over trunk Upper body dressing/undressing: 5: Set-up assist to: Obtain clothing/put away FIM - Lower Body Dressing/Undressing Lower body dressing/undressing steps patient completed: Thread/unthread right underwear leg;Pull underwear up/down;Thread/unthread right pants leg;Thread/unthread left pants leg;Thread/unthread left underwear leg;Pull pants up/down;Don/Doff left sock;Don/Doff right sock Lower body dressing/undressing: 4: Steadying Assist (verbal cues for tech)  FIM - Toileting Toileting: 0: Activity did not occur  FIM - Diplomatic Services operational officer Devices: Grab bars Toilet Transfers: 4-To toilet/BSC: Min A (steadying Pt. > 75%)  FIM - Bed/Chair Transfer Bed/Chair Transfer Assistive Devices: Arm  rests;Walker Bed/Chair Transfer: 3: Bed > Chair or W/C: Mod A (lift or lower assist)  FIM - Locomotion: Wheelchair Distance: 30 Locomotion: Wheelchair: 1: Travels less than 50 ft with maximal assistance (Pt: 25 - 49%) FIM - Locomotion: Ambulation Locomotion: Ambulation Assistive Devices: Designer, industrial/product Ambulation/Gait Assistance: 4: Min assist Locomotion: Ambulation: 2: Travels 50 - 149 ft with minimal assistance (Pt.>75%)  Comprehension Comprehension Mode: Auditory Comprehension: 5-Understands complex 90% of the time/Cues < 10% of the time  Expression Expression Mode: Verbal Expression: 5-Expresses basic 90% of the time/requires cueing < 10% of the time.  Social Interaction Social Interaction: 5-Interacts appropriately 90% of the time - Needs monitoring or encouragement for participation or interaction.  Problem Solving Problem Solving: 5-Solves basic 90% of the time/requires cueing < 10% of the time  Memory Memory: 5-Recognizes or recalls 90% of the time/requires cueing < 10% of the time  Medical Problem List and Plan:  1. DVT Prophylaxis/Anticoagulation: Pharmaceutical: Lovenox  2. Pain Management: Will use tylenol prn for pain. Add voltaren gel for left wrist pain.  3. Mood: Flat affect with underlying anxiety noted. Will continue celexa and klonopin. Provide ego support. Will have LCSW follow up for evaluation.  4. Neuropsych: This patient is capable of making decisions on his/her own behalf.  5. DM type 2: Monitor BS with AC/HS checks. Off metformin.  6. HTN: Will monitor with bid checks. Will start low dose ace for better control will close monitoring for hypotension. .  7. Dyslipidemia: LDL--128. Will Lipitor in place of Zocor.      LOS (Days) 2 A FACE TO FACE EVALUATION WAS PERFORMED  Rolin Schult E 09/01/2012, 8:10 AM

## 2012-09-01 NOTE — Progress Notes (Signed)
Physical Therapy Note  Patient Details  Name: Sophia Rodriguez MRN: [id number] Date of Birth: [birth year] Today's Date: 09/01/2012  Time: 1330-1430 60 minutes Group therapy  No c/o pain.  Pt participated in group therapy session for LE strengthening in seated and standing position with min verbal and gesturing cues.  Pt performed gait training and transfers at min A level to increase activity tolerance.  Pt with good participation in group session with min-mod cues.   Aerabella Galasso 09/01/2012, 3:42 PM

## 2012-09-01 NOTE — Progress Notes (Signed)
Speech Language Pathology Daily Session Note  Patient Details  Name: Sophia Rodriguez MRN: [id number] Date of Birth: [birth year]  Today's Date: 09/01/2012 Time: 4098-1191 Time Calculation (min): 27 min  Short Term Goals: Week 1: SLP Short Term Goal 1 (Week 1): Patient will utilize word finding strategies durign conversation with Supervision level verbal cues. SLP Short Term Goal 2 (Week 1): Patient will utilize external aids to assist with recall of daily information with Supervision level verbal cues. SLP Short Term Goal 3 (Week 1): Patient will demonstrate moderately complex problem solving while completing familiar tasks with Min assist verbal cues.  Skilled Therapeutic Interventions: Skilled treatment session focused on addressing cognition goals.  SLP facilitated session with financial and medicaiton managment tasks with Min faded to Supervision level verbal cues to self-monitor errors.  Once sLP assisted at identfying errors and cues patient to double check her math she was able to correct errors.     FIM:  Comprehension Comprehension Mode: Auditory Comprehension: 5-Understands complex 90% of the time/Cues < 10% of the time Expression Expression Mode: Verbal Expression: 5-Expresses basic 90% of the time/requires cueing < 10% of the time. Social Interaction Social Interaction: 5-Interacts appropriately 90% of the time - Needs monitoring or encouragement for participation or interaction. Problem Solving Problem Solving: 5-Solves complex 90% of the time/cues < 10% of the time Memory Memory: 5-Recognizes or recalls 90% of the time/requires cueing < 10% of the time  Pain Pain Assessment Pain Assessment: No/denies pain  Therapy/Group: Individual Therapy  Charlane Ferretti., CCC-SLP [phone]  Sophia Rodriguez 09/01/2012, 2:28 PM

## 2012-09-01 NOTE — Evaluation (Signed)
Recreational Therapy Assessment and Plan  Patient Details  Name: Sophia Rodriguez MRN: [id number] Date of Birth: Jun 30, 1950 Today's Date: 09/01/2012  Rehab Potential: Good ELOS: 2 weeks   Assessment Clinical Impression:Problem List:  Patient Active Problem List    Diagnosis  Date Noted   .  DM type 2 causing neurological disease  08/26/2012   .  Stroke    .  Hypertension     Past Medical History:  Past Medical History   Diagnosis  Date   .  Stroke    .  Hypertension    .  Diabetes mellitus     Past Surgical History: No past surgical history on file.  Assessment & Plan  Clinical Impression: Sophia Rodriguez is a 62 y.o. 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 She was admitted on 08/26/12 with 24 hour history of difficulty walking, decrease in balance as well as increased right sided weakness. MRI of the brain showed acute nonhemorrhagic infarct at the junction of the right thalamus and posterior limb of the right internal capsule as well as remote moderate to large size infarct medial aspect left frontal and parietal lobes as well as left globus pallidus and remote left paracentral small infarct. MRA of the head with atherosclerotic type changes. MRA of the neck without significant stenosis. Echocardiogram with EF 60-65% and source of emboli. Carotid Dopplers with no ICA stenosis. Neurology recommended changing patient to aggrenox for thrombotic stroke due to SVD. Patient with left upper sided weakness with decreased coordination and flat affect. Therapies initiated and she was noted to have decrease in balance reactions as well as issues with motor planning and initiation. Therapy team and MD recommended CIR and patient admitted today. Patient transferred to CIR on 08/30/2012.  Pt presents with decreased activity tolerance, decreased functional mobility,  decreased balance, right sided weakness, decreased coordination Limiting pt's independence with leisure/community pursuits.  Leisure History/Participation Premorbid leisure interest/current participation: Crafts - Sewing;Crafts - Knitting/Crocheting;Community - Journalist, newspaper - Warden/ranger - Word-search (suduko) Other Leisure Interests: Television;Reading;Cooking/Baking;Housework Leisure Participation Style: Alone;With Family/Friends Awareness of Community Resources: Good-identify 3 post discharge leisure resources Psychosocial / Spiritual Patient agreeable to Pet Therapy: No Does patient have pets?: No Social interaction - Mood/Behavior: Cooperative Film/video editor for Education?: Yes Patient Agreeable to Outing?: Yes Recreational Therapy Orientation Orientation -Reviewed with patient: Available activity resources Strengths/Weaknesses Patient Strengths/Abilities: Willingness to participate;Active premorbidly Patient weaknesses: Physical limitations  Plan Rec Therapy Plan Is patient appropriate for Therapeutic Recreation?: Yes Rehab Potential: Good Treatment times per week: Min 2 times per week >20 minutes Estimated Length of Stay: 2 weeks TR Treatment/Interventions: Adaptive equipment instruction;1:1 session;Balance/vestibular training;Functional mobility training;Community reintegration;Patient/family education;Therapeutic activities;Recreation/leisure participation;Therapeutic exercise;UE/LE Coordination activities  Recommendations for other services: None  Discharge Criteria: Patient will be discharged from TR if patient refuses treatment 3 consecutive times without medical reason.  If treatment goals not met, if there is a change in medical status, if patient makes no progress towards goals or if patient is discharged from hospital.  The above assessment, treatment plan, treatment alternatives and goals were discussed and mutually agreed  upon: by patient  Sophia Rodriguez 09/01/2012, 3:00 PM

## 2012-09-01 NOTE — Progress Notes (Signed)
Physical Therapy Session Note  Patient Details  Name: Sophia Rodriguez MRN: [id number] Date of Birth: 1950/08/04  Today's Date: 09/01/2012 Time: 0930-1026 Time Calculation (min): 56 min  Short Term Goals: Week 1:  PT Short Term Goal 1 (Week 1): Patient will perform bed mobility with mod I. PT Short Term Goal 2 (Week 1): Patient will perform stand pivot transfers with LRAD and supervision. PT Short Term Goal 3 (Week 1): Patient will ambulate 150' in controlled environment with LRAD and supervision. PT Short Term Goal 4 (Week 1): Patient will negotiate 5 steps with one handrail with supervision (for community access).  Skilled Therapeutic Interventions/Progress Updates:    Patient received sitting in wheelchair. Session focused on pre-gait activities, gait training, and static/dynamic balance activities through [facility] Balance Test assessment.  Pre-gait activities: Forward/retro stepping x30 each LE with R handrail to facilitate increased step length and increased weight bearing on each LE. Patient demonstrates increased difficulty advancing R LE and retro-stepping with R LE. Patient requires manual facilitation for lateral weight shifts to assist with advancement of each LE. Patient demonstrates decreased DF bilaterally, decreased hip/knee flexion bilaterally, which contributes to poor foot clearance bilaterally, but worse on the R.  Patient returned to room and left seated in wheelchair with all needs within reach.  Therapy Documentation Precautions:  Precautions Precautions: Fall Precaution Comments: 2-3 falls at home within last month per pt.  Restrictions Weight Bearing Restrictions: No Pain: Pain Assessment Pain Assessment: No/denies pain Pain Score: 0-No pain Locomotion : Ambulation Ambulation: Yes Ambulation/Gait Assistance: 4: Min assist Ambulation Distance (Feet): 90 Feet x1, 75' x1 Assistive device: Rolling walker Ambulation/Gait Assistance Details: Tactile cues for  initiation;Verbal cues for precautions/safety;Verbal cues for gait pattern;Manual facilitation for weight shifting Ambulation/Gait Assistance Details: Patient instructed in gait training 90' x1 and 75' x1 in controlled environment with RW and min A required for lateral weight shifts; gait and step/stride length improves with manual facilitation for weight shifts. Patient requires manual facilitaiton, tactile and verbal cues to initiate gait. Gait Gait: Yes Gait Pattern: Impaired Gait Pattern: Step-to pattern;Decreased step length - right;Decreased weight shift to right;Decreased dorsiflexion - right;Wide base of support;Trunk flexed;Shuffle;Decreased hip/knee flexion - right;Decreased hip/knee flexion - left;Decreased stance time - left Wheelchair Mobility Wheelchair Mobility: Yes Wheelchair Assistance: 2: Max Chiropodist Details: Tactile cues for placement;Verbal cues for sequencing;Verbal cues for technique;Verbal cues for Engineer, drilling: Both upper extremities Wheelchair Parts Management: Needs assistance Distance: 30  Balance: Balance Balance Assessed: Yes Standardized Balance Assessment Standardized Balance Assessment: Berg Balance Test Berg Balance Test Sit to Stand: Needs minimal aid to stand or to stabilize Standing Unsupported: Able to stand 2 minutes with supervision Sitting with Back Unsupported but Feet Supported on Floor or Stool: Able to sit safely and securely 2 minutes Stand to Sit: Sits independently, has uncontrolled descent Transfers: Needs one person to assist Standing Unsupported with Eyes Closed: Able to stand 10 seconds with supervision Standing Ubsupported with Feet Together: Needs help to attain position but able to stand for 30 seconds with feet together From Standing, Reach Forward with Outstretched Arm: Can reach forward >5 cm safely (2") From Standing Position, Pick up Object from Floor: Able to pick up shoe, needs  supervision From Standing Position, Turn to Look Behind Over each Shoulder: Turn sideways only but maintains balance Turn 360 Degrees: Needs assistance while turning Standing Unsupported, Alternately Place Feet on Step/Stool: Needs assistance to keep from falling or unable to try Standing Unsupported, One Foot in Front: [employer]  balance while stepping or standing Standing on One Leg: Unable to try or needs assist to prevent fall Total Score: 21/56 indicating patient is at the highest risk for falls (close to 100%).  See FIM for current functional status  Therapy/Group: Individual Therapy  [name] Era Parr, PT, DPT 09/01/2012, 11:01 AM

## 2012-09-02 ENCOUNTER — Inpatient Hospital Stay (HOSPITAL_COMMUNITY): Payer: Medicare Other

## 2012-09-02 ENCOUNTER — Inpatient Hospital Stay (HOSPITAL_COMMUNITY): Payer: Medicare Other | Admitting: Speech Pathology

## 2012-09-02 ENCOUNTER — Ambulatory Visit: Payer: Medicare Other | Admitting: Physical Therapy

## 2012-09-02 ENCOUNTER — Encounter: Payer: Medicare Other | Admitting: Occupational Therapy

## 2012-09-02 ENCOUNTER — Inpatient Hospital Stay (HOSPITAL_COMMUNITY): Payer: Medicare Other | Admitting: *Deleted

## 2012-09-02 LAB — GLUCOSE, CAPILLARY: Glucose-Capillary: 119 mg/dL — ABNORMAL HIGH (ref 70–99)

## 2012-09-02 NOTE — Progress Notes (Signed)
Physical Therapy Session Note  Patient Details  Name: Sophia Rodriguez MRN: [id number] Date of Birth: 09/28/1950  Today's Date: 09/02/2012 Time: 1130-1200 Time Calculation (min): 30 min  Short Term Goals: Week 1:  PT Short Term Goal 1 (Week 1): Patient will perform bed mobility with mod I. PT Short Term Goal 2 (Week 1): Patient will perform stand pivot transfers with LRAD and supervision. PT Short Term Goal 3 (Week 1): Patient will ambulate 150' in controlled environment with LRAD and supervision. PT Short Term Goal 4 (Week 1): Patient will negotiate 5 steps with one handrail with supervision (for community access).  Skilled Therapeutic Interventions/Progress Updates:    Patient received sitting in wheelchair. Session focused on gait training and increasing activity tolerance. See details below for gait training. Patient unable to perform additional gait training or weight bearing activities secondary to pain on plantar surface of R foot. RN notified at end of session. Patient exercised on NuStep Level 4 x7' with B UE and B LE to facilitate improved initiation, motor planning, and coordination through reciprocal movements.   Patient returned to room and left seated in wheelchair with all needs within reach.  Therapy Documentation Precautions:  Precautions Precautions: Fall Precaution Comments: 2-3 falls at home within last month per pt.  Restrictions Weight Bearing Restrictions: No Pain: Pain Assessment Pain Assessment: No/denies pain Pain Score: 0-No pain Locomotion : Ambulation Ambulation: Yes Ambulation/Gait Assistance: 4: Min guard Ambulation Distance (Feet): 70 Feet Assistive device: Rolling walker Ambulation/Gait Assistance Details: Tactile cues for initiation;Verbal cues for precautions/safety;Verbal cues for gait pattern;Tactile cues for weight shifting Ambulation/Gait Assistance Details: Patient instructed in gait training 78' x1 in controlled environment with min guard  required for manual facilitation for lateral weight shifts to improve step/stride length. Patient with c/o of pain on R plantar surface of foot during gait training and unable to perform additional gait training during session. Gait Gait: Yes Gait Pattern: Impaired Gait Pattern: Step-to pattern;Decreased step length - right;Decreased weight shift to right;Decreased dorsiflexion - right;Wide base of support;Trunk flexed;Shuffle;Decreased hip/knee flexion - right;Decreased hip/knee flexion - left;Decreased stance time - left   See FIM for current functional status  Therapy/Group: Individual Therapy  [name] Amyr Sluder, PT, DPT 09/02/2012, 12:26 PM

## 2012-09-02 NOTE — Progress Notes (Signed)
Patient ID: Sophia Rodriguez, female   DOB: March 29, 1950, 62 y.o.   MRN: [id number] Subjective/Complaints: 62 y.o. 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 She was admitted on 08/26/12 with 24 hour history of difficulty walking, decrease in balance as well as increased right sided weakness. MRI of the brain showed acute nonhemorrhagic infarct at the junction of the right thalamus and posterior limb of the right internal capsule as well as remote moderate to large size infarct medial aspect left frontal and parietal lobes as well as left globus pallidus and remote left paracentral small infarct. MRA of the head with atherosclerotic type changes. MRA of the neck without significant stenosis. Echocardiogram with EF 60-65% and source of emboli. Carotid Dopplers with no ICA stenosis. Neurology recommended changing patient to aggrenox for thrombotic stroke due to SVD Slept ok last noc, no BM today thus far  Per therapy moves very slowly Cont bowel and bladder  Review of Systems  Neurological: Positive for focal weakness.  All other systems reviewed and are negative.    Objective: Vital Signs: Blood pressure 115/72, pulse 76, temperature 98.2 F (36.8 C), temperature source Oral, resp. rate 17, weight 73.4 kg (161 lb 13.1 oz), SpO2 99.00%. No results found. Results for orders placed during the hospital encounter of 08/30/12 (from the past 72 hour(s))  GLUCOSE, CAPILLARY     Status: Abnormal   Collection Time    08/30/12  4:35 PM      Result Value Range   Glucose-Capillary 110 (*) 70 - 99 mg/dL  GLUCOSE, CAPILLARY     Status: Abnormal   Collection Time    08/30/12  9:04 PM      Result Value Range   Glucose-Capillary 137 (*) 70 - 99 mg/dL  CBC WITH DIFFERENTIAL     Status: Abnormal   Collection Time    08/31/12  5:25 AM      Result Value Range   WBC 4.9  4.0 -  10.5 K/uL   RBC 5.29 (*) 3.87 - 5.11 MIL/uL   Hemoglobin 13.6  12.0 - 15.0 g/dL   HCT 36.6  44.0 - 34.7 %   MCV 79.4  78.0 - 100.0 fL   MCH 25.7 (*) 26.0 - 34.0 pg   MCHC 32.4  30.0 - 36.0 g/dL   RDW 42.5  95.6 - 38.7 %   Platelets 229  150 - 400 K/uL   Neutrophils Relative % 62  43 - 77 %   Neutro Abs 3.0  1.7 - 7.7 K/uL   Lymphocytes Relative 29  12 - 46 %   Lymphs Abs 1.4  0.7 - 4.0 K/uL   Monocytes Relative 8  3 - 12 %   Monocytes Absolute 0.4  0.1 - 1.0 K/uL   Eosinophils Relative 1  0 - 5 %   Eosinophils Absolute 0.1  0.0 - 0.7 K/uL   Basophils Relative 0  0 - 1 %   Basophils Absolute 0.0  0.0 - 0.1 K/uL  COMPREHENSIVE METABOLIC PANEL     Status: Abnormal   Collection Time    08/31/12  5:25 AM      Result Value Range   Sodium 140  135 - 145 mEq/L   Potassium 3.7  3.5 - 5.1 mEq/L   Chloride 104  96 - 112 mEq/L   CO2 25  19 - 32 mEq/L   Glucose, Bld 117 (*) 70 -  99 mg/dL   BUN 16  6 - 23 mg/dL   Creatinine, Ser 1.61  0.50 - 1.10 mg/dL   Calcium 9.6  8.4 - 09.6 mg/dL   Total Protein 7.3  6.0 - 8.3 g/dL   Albumin 3.6  3.5 - 5.2 g/dL   AST 20  0 - 37 U/L   ALT 16  0 - 35 U/L   Alkaline Phosphatase 80  39 - 117 U/L   Total Bilirubin 0.4  0.3 - 1.2 mg/dL   GFR calc non Af Amer 70 (*) >90 mL/min   GFR calc Af Amer 81 (*) >90 mL/min   Comment:            The eGFR has been calculated     using the CKD EPI equation.     This calculation has not been     validated in all clinical     situations.     eGFR's persistently     <90 mL/min signify     possible Chronic Kidney Disease.  GLUCOSE, CAPILLARY     Status: Abnormal   Collection Time    08/31/12  7:48 AM      Result Value Range   Glucose-Capillary 101 (*) 70 - 99 mg/dL  GLUCOSE, CAPILLARY     Status: Abnormal   Collection Time    08/31/12 11:53 AM      Result Value Range   Glucose-Capillary 131 (*) 70 - 99 mg/dL  GLUCOSE, CAPILLARY     Status: Abnormal   Collection Time    08/31/12  4:20 PM      Result Value  Range   Glucose-Capillary 134 (*) 70 - 99 mg/dL  GLUCOSE, CAPILLARY     Status: None   Collection Time    08/31/12  9:55 PM      Result Value Range   Glucose-Capillary 97  70 - 99 mg/dL  GLUCOSE, CAPILLARY     Status: Abnormal   Collection Time    09/01/12  7:36 AM      Result Value Range   Glucose-Capillary 124 (*) 70 - 99 mg/dL  GLUCOSE, CAPILLARY     Status: Abnormal   Collection Time    09/01/12 11:07 AM      Result Value Range   Glucose-Capillary 109 (*) 70 - 99 mg/dL  GLUCOSE, CAPILLARY     Status: None   Collection Time    09/01/12  4:46 PM      Result Value Range   Glucose-Capillary 88  70 - 99 mg/dL  GLUCOSE, CAPILLARY     Status: Abnormal   Collection Time    09/01/12  8:42 PM      Result Value Range   Glucose-Capillary 167 (*) 70 - 99 mg/dL  GLUCOSE, CAPILLARY     Status: Abnormal   Collection Time    09/02/12  7:25 AM      Result Value Range   Glucose-Capillary 126 (*) 70 - 99 mg/dL      Nursing note and vitals reviewed.  Constitutional: She is oriented to person, place, and time. She appears well-developed and well-nourished.  HENT:  Head: Normocephalic and atraumatic.  Eyes: Pupils are equal, round, and reactive to light.  Neck: Normal range of motion. Neck supple.  Cardiovascular: Normal rate and regular rhythm.  Pulmonary/Chest: Effort normal and breath sounds normal. No respiratory distress. She has no wheezes.  Abdominal: Soft. Bowel sounds are normal.  Musculoskeletal: She exhibits no edema and no tenderness.  Neurological: She is alert and oriented to person, place, and time.  Soft spoken. Speech clear. Some delay in initiation but able to followcommands without difficulty. LUE weakness with decrease in fine motor control. RLE with minimal weakness.  Skin: Skin is warm and dry.  Psychiatric: Her speech is normal and behavior is normal. Thought content normal. Her mood appears appropriate. Cognition and memory are normal.  Motor strength: 4/5 in the  right deltoid, biceps, triceps, grip, hip flexor, knee extensors, ankle dorsiflexor plantar flexor  4 minus/5 in the left deltoid, biceps, triceps, grip, hip flexor, knee extensors, ankle dorsiflexor and plantar flexor  Sensation intact to light touch and proprioception in bilateral hands and feet with the exception of left little finger  Visual fields intact to confrontation testing  Cerebellar testing no evidence of ataxia on finger nose to finger and heel-to-shin testing are   Assessment/Plan: 1. 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 Physiatrist is providing close team supervision and 24 hour management of active medical problems listed below. Physiatrist and rehab team continue to assess barriers to discharge/monitor patient progress toward functional and medical goals. Team conference today please see physician documentation under team conference tab, met with team face-to-face to discuss problems,progress, and goals. Formulized individual treatment plan based on medical history, underlying problem and comorbidities. FIM: FIM - Bathing Bathing Steps Patient Completed: Right Arm;Right upper leg;Left upper leg;Abdomen;Left Arm;Buttocks;Front perineal area;Chest;Right lower leg (including foot);Left lower leg (including foot) Bathing: 4: Steadying assist  FIM - Upper Body Dressing/Undressing Upper body dressing/undressing steps patient completed: Thread/unthread right bra strap;Thread/unthread left bra strap;Hook/unhook bra;Thread/unthread left sleeve of pullover shirt/dress;Thread/unthread right sleeve of pullover shirt/dresss;Put head through opening of pull over shirt/dress;Pull shirt over trunk Upper body dressing/undressing: 5: Set-up assist to: Obtain clothing/put away FIM -  Lower Body Dressing/Undressing Lower body dressing/undressing steps patient completed: Thread/unthread right underwear leg;Pull underwear up/down;Thread/unthread right pants leg;Thread/unthread left pants leg;Thread/unthread left underwear leg;Pull pants up/down;Don/Doff left sock;Don/Doff right sock Lower body dressing/undressing: 4: Steadying Assist  FIM - Toileting Toileting: 0: Activity did not occur  FIM - Diplomatic Services operational officer Devices: Grab bars;Walker Toilet Transfers: 4-To toilet/BSC: Min A (steadying Pt. > 75%);4-From toilet/BSC: Min A (steadying Pt. > 75%)  FIM - Bed/Chair Transfer Bed/Chair Transfer Assistive Devices: Arm rests;Walker Bed/Chair Transfer: 4: Bed > Chair or W/C: Min A (steadying Pt. > 75%);4: Chair or W/C > Bed: Min A (steadying Pt. > 75%)  FIM - Locomotion: Wheelchair Distance: 30 Locomotion: Wheelchair: 1: Travels less than 50 ft with maximal assistance (Pt: 25 - 49%) FIM - Locomotion: Ambulation Locomotion: Ambulation Assistive Devices: Designer, industrial/product Ambulation/Gait Assistance: 4: Min assist Locomotion: Ambulation: 2: Travels 50 - 149 ft with minimal assistance (Pt.>75%)  Comprehension Comprehension Mode: Auditory Comprehension: 5-Follows basic conversation/direction: With no assist  Expression Expression Mode: Verbal Expression: 5-Expresses complex 90% of the time/cues < 10% of the time  Social Interaction Social Interaction: 5-Interacts appropriately 90% of the time - Needs monitoring or encouragement for participation or interaction.  Problem Solving Problem Solving: 5-Solves complex 90% of the time/cues < 10% of the time  Memory Memory: 5-Recognizes or recalls 90% of the time/requires cueing < 10% of the time  Medical Problem List and Plan:  1. DVT Prophylaxis/Anticoagulation: Pharmaceutical: Lovenox  2. Pain Management: Will use tylenol prn for  pain. Add voltaren gel for left wrist pain.  3. Mood: Flat affect with  underlying anxiety noted. Will continue celexa and klonopin. Provide ego support. Will have LCSW follow up for evaluation.  4. Neuropsych: This patient is capable of making decisions on his/her own behalf.  5. DM type 2: Monitor BS with AC/HS checks. Off metformin.  6. HTN: Will monitor with bid checks. Will start low dose ace for better control will close monitoring for hypotension. .  7. Dyslipidemia: LDL--128. Will Lipitor in place of Zocor.      LOS (Days) 3 A FACE TO FACE EVALUATION WAS PERFORMED  Tavita Eastham E 09/02/2012, 8:13 AM

## 2012-09-02 NOTE — Progress Notes (Signed)
Occupational Therapy Session Note  Patient Details  Name: Sophia Rodriguez MRN: [id number] Date of Birth: 1950/09/05  Today's Date: 09/02/2012 Time: 0730-0830 and 5621-3086  Time Calculation (min): 60 min and 58 min   Short Term Goals: Week 1:  OT Short Term Goal 1 (Week 1): Pt will complete LB dressing with supervision for standing balance and no more then 3 verbal cue for sequencing/tech OT Short Term Goal 2 (Week 1): Pt will complete bathing task with supervision and no more then 3 verbal cue for sequencing OT Short Term Goal 3 (Week 1): Pt complete toileting transfer with min assist and 1 verbal cue for motor planning  OT Short Term Goal 4 (Week 1): Pt will complete shower transfer with min assist OT Short Term Goal 5 (Week 1): Pt willl sequence and initiate morning self-care tasks with no more then 2 verbal cues.  Skilled Therapeutic Interventions/Progress Updates:    Session 1: Therapy session on ADL session focusing on problem solving, initiation, and motor planning during functional transfers. Pt demonstrating improved initiation of self-care tasks on this date requiring only 1 verbal during bathing task and no verbal cues for sequencing/initiation during dressing tasks. Ambulated into walk-in shower with min assist and increased time as pt demonstrates very short strides secondary to impaired motor planning. When transferring out of shower pt had decreased problem solving skills as she stood up then was unable to plan how to step out of walk-in shower to get to RW using grab bars. Required verbal directions then pt able to complete transfer quickly. Completed sit<>stand with min-steadying assist and verbal cues for hand placement 75% of time. Pt with good safety judgement as she recalled needing to wear non-slip socks prior to standing. Pt with decreased carryover of hemi dressing tech as she threaded RLE then LLE into undergarments with increased time. OT provided verbal cue to thread LLE  first however when presented pants pt threaded RLE->LLE requiring assist to thread LLE. Pt becomes frustrated when struggling with task and attempts to give up requiring cues to take rest break then return task. Pt usually successful after this.   Session 2: Therapy session focused on dynamic standing balance, motor planning during functional tranfers and scooting, activity tolerance, and reaching. Pt ambulated short household distances with steadying-close supervision (approx10-20 feet). Completed tub transfer using TTB without cues initially as pt has already been using one PTA. Pt completed transfer into tub with min-steadying assist. Pt with difficulty transferring out of tub as she was unable to problem solve how to position body on TTB in relevance to RW. Pt required verbal cues and visual demonstration to bring legs completely out of tub so they are perpendicular to side of tub. On 3rd attempt pt recalled to do this without cues. Required increased time for tub transfers and verbal cues for hand placement 50% of time when coming to stand. Practiced sit<>stand transfers focusing on making sure legs were touching edge of surface and reaching back to chair/surface to increase control when descending. Pt with most difficulty scooting during this task. Practiced scooting on mat around corner. Pt required increased time and verbal cues to weight shift forward to allow self to scoot. Pt with more difficulty scooting left then right. Engaged in dynamic standing activity in kitchen practicing transferring items down counter and from high/low cabinets. Pt reported having a tray for RW to assist with transporting items from each counter. Educated pt on using counter to assist with stabilizing as she reaching into low  cabinet and she demonstrated good carryover. Teach back method used during sit<>stand transfers and with tub transfer. Pt recalled techniques 75% of time and OT to continue with education and practice.    Therapy Documentation Precautions:  Precautions Precautions: Fall Precaution Comments: 2-3 falls at home within last month per pt.  Restrictions Weight Bearing Restrictions: No General:   Vital Signs:   Pain: No c/o pain during therapy sessions.   See FIM for current functional status  Therapy/Group: Individual Therapy  Daneil Dan 09/02/2012, 1:11 PM

## 2012-09-02 NOTE — Progress Notes (Signed)
Speech Language Pathology Daily Session Note  Patient Details  Name: Sophia Rodriguez MRN: [id number] Date of Birth: October 03, 1950  Today's Date: 09/02/2012 Time: 1130-1200 Time Calculation (min): 30 min  Short Term Goals: Week 1: SLP Short Term Goal 1 (Week 1): Patient will utilize word finding strategies durign conversation with Supervision level verbal cues. SLP Short Term Goal 2 (Week 1): Patient will utilize external aids to assist with recall of daily information with Supervision level verbal cues. SLP Short Term Goal 3 (Week 1): Patient will demonstrate moderately complex problem solving while completing familiar tasks with Min assist verbal cues.  Skilled Therapeutic Interventions: Skilled treatment session focused on addressing cognition goals.  SLP facilitated session with financial and medicaiton managment tasks with cues x1 to self-monitor and correct mathmatical errors and patient was Mod I for recall name and function of current medications with use of external aid.  SLP also facilitated session with education regarding  word finding strategies such as description and use of similar words.    FIM:  Comprehension Comprehension Mode: Auditory Comprehension: 5-Understands complex 90% of the time/Cues < 10% of the time Expression Expression Mode: Verbal Expression: 5-Expresses basic 90% of the time/requires cueing < 10% of the time. Social Interaction Social Interaction: 5-Interacts appropriately 90% of the time - Needs monitoring or encouragement for participation or interaction. Problem Solving Problem Solving: 5-Solves complex 90% of the time/cues < 10% of the time Memory Memory: 6-Assistive device: No helper  Pain Pain Assessment Pain Assessment: No/denies pain Pain Score: 0-No pain  Therapy/Group: Individual Therapy  Charlane Ferretti., CCC-SLP [phone]  Sophia Rodriguez 09/02/2012, 12:24 PM

## 2012-09-02 NOTE — Progress Notes (Signed)
Recreational Therapy Session Note  Patient Details  Name: Sophia Rodriguez MRN: [id number] Date of Birth: 1950/07/10 Today's Date: 09/02/2012 Time:  1440-1505 Pain: no c/o Skilled Therapeutic Interventions/Progress Updates: Pt working on simple tabletop word game focusing on problem solving while seated w/c level with supervision for set up & cuing.    Therapy/Group: Individual Therapy   Nayzeth Altman 09/02/2012, 4:14 PM

## 2012-09-02 NOTE — Progress Notes (Signed)
Social Work Patient ID: Sophia Rodriguez, female   DOB: Jun 08, 1950, 62 y.o.   MRN: [id number] Met with pt to inform team conference goals-supervision level and discharge 6/21.  Pt is aware she has deficits now that she did not before.  She plans to have her daughter pay her bills and Set up a medication box.  She feels she will be able to manage at home and plans to go home upon discharge.  She is apprecitative of the team's concerns but is going home. In the coming week will work on the issues and try to make sure she has plans in place to make her as safe as possible.  She is aware she is competent and can make her own decisions. Will work on pt going home.

## 2012-09-03 ENCOUNTER — Inpatient Hospital Stay (HOSPITAL_COMMUNITY): Payer: Medicare Other | Admitting: Physical Therapy

## 2012-09-03 ENCOUNTER — Inpatient Hospital Stay (HOSPITAL_COMMUNITY): Payer: Medicare Other | Admitting: Speech Pathology

## 2012-09-03 ENCOUNTER — Inpatient Hospital Stay (HOSPITAL_COMMUNITY): Payer: Medicare Other

## 2012-09-03 LAB — GLUCOSE, CAPILLARY: Glucose-Capillary: 89 mg/dL (ref 70–99)

## 2012-09-03 MED ORDER — BOOST / RESOURCE BREEZE PO LIQD
1.0000 | Freq: Two times a day (BID) | ORAL | Status: DC
  Administered 2012-09-04 – 2012-09-07 (×7): 1 via ORAL
  Filled 2012-09-03: qty 1

## 2012-09-03 NOTE — Progress Notes (Signed)
Patient ID: Sophia Rodriguez, female   DOB: [birth year], 62 y.o.   MRN: [id number] Subjective/Complaints: 62 y.o. 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 She was admitted on 08/26/12 with 24 hour history of difficulty walking, decrease in balance as well as increased right sided weakness. MRI of the brain showed acute nonhemorrhagic infarct at the junction of the right thalamus and posterior limb of the right internal capsule as well as remote moderate to large size infarct medial aspect left frontal and parietal lobes as well as left globus pallidus and remote left paracentral small infarct. MRA of the head with atherosclerotic type changes. MRA of the neck without significant stenosis. Echocardiogram with EF 60-65% and source of emboli. Carotid Dopplers with no ICA stenosis. Neurology recommended changing patient to aggrenox for thrombotic stroke due to SVD Slept ok last noc, no BM today thus far  Per therapy moves very slowly Cont bowel and bladder  Review of Systems  Neurological: Positive for focal weakness.  All other systems reviewed and are negative.    Objective: Vital Signs: Blood pressure 121/77, pulse 86, temperature 97.6 F (36.4 C), temperature source Oral, resp. rate 18, weight 73.4 kg (161 lb 13.1 oz), SpO2 99.00%. No results found. Results for orders placed during the hospital encounter of 08/30/12 (from the past 72 hour(s))  GLUCOSE, CAPILLARY     Status: Abnormal   Collection Time    08/31/12 11:53 AM      Result Value Range   Glucose-Capillary 131 (*) 70 - 99 mg/dL  GLUCOSE, CAPILLARY     Status: Abnormal   Collection Time    08/31/12  4:20 PM      Result Value Range   Glucose-Capillary 134 (*) 70 - 99 mg/dL  GLUCOSE, CAPILLARY     Status: None   Collection Time    08/31/12  9:55 PM      Result Value Range   Glucose-Capillary 97   70 - 99 mg/dL  GLUCOSE, CAPILLARY     Status: Abnormal   Collection Time    09/01/12  7:36 AM      Result Value Range   Glucose-Capillary 124 (*) 70 - 99 mg/dL  GLUCOSE, CAPILLARY     Status: Abnormal   Collection Time    09/01/12 11:07 AM      Result Value Range   Glucose-Capillary 109 (*) 70 - 99 mg/dL  GLUCOSE, CAPILLARY     Status: None   Collection Time    09/01/12  4:46 PM      Result Value Range   Glucose-Capillary 88  70 - 99 mg/dL  GLUCOSE, CAPILLARY     Status: Abnormal   Collection Time    09/01/12  8:42 PM      Result Value Range   Glucose-Capillary 167 (*) 70 - 99 mg/dL  GLUCOSE, CAPILLARY     Status: Abnormal   Collection Time    09/02/12  7:25 AM      Result Value Range   Glucose-Capillary 126 (*) 70 - 99 mg/dL  GLUCOSE, CAPILLARY     Status: Abnormal   Collection Time    09/02/12 12:02 PM      Result Value Range   Glucose-Capillary 111 (*) 70 - 99 mg/dL  GLUCOSE, CAPILLARY     Status: Abnormal   Collection Time    09/02/12  4:33 PM      Result Value  Range   Glucose-Capillary 124 (*) 70 - 99 mg/dL  GLUCOSE, CAPILLARY     Status: Abnormal   Collection Time    09/02/12  9:03 PM      Result Value Range   Glucose-Capillary 119 (*) 70 - 99 mg/dL   Comment 1 Notify RN    GLUCOSE, CAPILLARY     Status: Abnormal   Collection Time    09/03/12  7:25 AM      Result Value Range   Glucose-Capillary 132 (*) 70 - 99 mg/dL   Comment 1 Notify RN        Nursing note and vitals reviewed.  Constitutional: She is oriented to person, place, and time. She appears well-developed and well-nourished.  HENT:  Head: Normocephalic and atraumatic.  Eyes: Pupils are equal, round, and reactive to light.  Neck: Normal range of motion. Neck supple.  Cardiovascular: Normal rate and regular rhythm.  Pulmonary/Chest: Effort normal and breath sounds normal. No respiratory distress. She has no wheezes.  Abdominal: Soft. Bowel sounds are normal.  Musculoskeletal: She exhibits no  edema and no tenderness.  Neurological: She is alert and oriented to person, place, and time.  Soft spoken. Speech clear. Some delay in initiation but able to followcommands without difficulty. LUE weakness with decrease in fine motor control. RLE with minimal weakness.  Skin: Skin is warm and dry.  Psychiatric: Her speech is normal and behavior is normal. Thought content normal. Her mood appears appropriate. Cognition and memory are normal.  Motor strength: 4/5 in the right deltoid, biceps, triceps, grip, hip flexor, knee extensors, ankle dorsiflexor plantar flexor  4 minus/5 in the left deltoid, biceps, triceps, grip, hip flexor, knee extensors, ankle dorsiflexor and plantar flexor  Sensation intact to light touch and proprioception in bilateral hands and feet with the exception of left little finger  Visual fields intact to confrontation testing  Cerebellar testing no evidence of ataxia on finger nose to finger and heel-to-shin testing are   Assessment/Plan: 1. 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 Physiatrist is providing close team supervision and 24 hour management of active medical problems listed below. Physiatrist and rehab team continue to assess barriers to discharge/monitor patient progress toward functional and medical goals. Team conference today please see physician documentation under team conference tab, met with team face-to-face to discuss problems,progress, and goals. Formulized individual treatment plan based on medical history, underlying problem and comorbidities. FIM: FIM - Bathing Bathing Steps Patient Completed: Right Arm;Right upper leg;Left upper leg;Abdomen;Left Arm;Buttocks;Front perineal area;Chest;Right lower leg (including foot);Left lower leg  (including foot) Bathing: 4: Steadying assist  FIM - Upper Body Dressing/Undressing Upper body dressing/undressing steps patient completed: Thread/unthread right bra strap;Thread/unthread left bra strap;Hook/unhook bra;Thread/unthread left sleeve of pullover shirt/dress;Thread/unthread right sleeve of pullover shirt/dresss;Put head through opening of pull over shirt/dress;Pull shirt over trunk Upper body dressing/undressing: 5: Set-up assist to: Obtain clothing/put away FIM - Lower Body Dressing/Undressing Lower body dressing/undressing steps patient completed: Thread/unthread right underwear leg;Pull underwear up/down;Thread/unthread right pants leg;Thread/unthread left pants leg;Thread/unthread left underwear leg;Pull pants up/down;Don/Doff left sock;Don/Doff right sock Lower body dressing/undressing: 4: Steadying Assist  FIM - Toileting Toileting steps completed by patient: Adjust clothing prior to toileting;Adjust clothing after toileting Toileting: 4: Steadying assist  FIM - Diplomatic Services operational officer Devices: Grab bars;Walker Toilet Transfers: 4-To toilet/BSC: Min  A (steadying Pt. > 75%);4-From toilet/BSC: Min A (steadying Pt. > 75%)  FIM - Bed/Chair Transfer Bed/Chair Transfer Assistive Devices: Arm rests;Walker Bed/Chair Transfer: 4: Bed > Chair or W/C: Min A (steadying Pt. > 75%);4: Chair or W/C > Bed: Min A (steadying Pt. > 75%)  FIM - Locomotion: Wheelchair Distance: 30 Locomotion: Wheelchair: 1: Total Assistance/staff pushes wheelchair (Pt<25%) FIM - Locomotion: Ambulation Locomotion: Ambulation Assistive Devices: Designer, industrial/product Ambulation/Gait Assistance: 4: Min guard Locomotion: Ambulation: 2: Travels 50 - 149 ft with minimal assistance (Pt.>75%)  Comprehension Comprehension Mode: Auditory Comprehension: 5-Follows basic conversation/direction: With no assist  Expression Expression Mode: Verbal Expression: 5-Expresses basic 90% of the time/requires  cueing < 10% of the time.  Social Interaction Social Interaction: 5-Interacts appropriately 90% of the time - Needs monitoring or encouragement for participation or interaction.  Problem Solving Problem Solving: 5-Solves basic 90% of the time/requires cueing < 10% of the time  Memory Memory: 5-Recognizes or recalls 90% of the time/requires cueing < 10% of the time  Medical Problem List and Plan:  1. DVT Prophylaxis/Anticoagulation: Pharmaceutical: Lovenox  2. Pain Management: Will use tylenol prn for pain. Add voltaren gel for left wrist pain.  3. Mood: Flat affect with underlying anxiety noted. Will continue celexa and klonopin. Provide ego support. Will have LCSW follow up for evaluation.  4. Neuropsych: This patient is capable of making decisions on his/her own behalf.  5. DM type 2: Monitor BS with AC/HS checks. Off metformin.  6. HTN: Will monitor with bid checks. Will start low dose ace for better control will close monitoring for hypotension. .  7. Dyslipidemia: LDL--128. Will Lipitor in place of Zocor.      LOS (Days) 4 A FACE TO FACE EVALUATION WAS PERFORMED  Makiah Foye E 09/03/2012, 8:13 AM

## 2012-09-03 NOTE — Progress Notes (Signed)
Speech Language Pathology Daily Session Note  Patient Details  Name: Sophia Rodriguez MRN: [id number] Date of Birth: 1950/12/26  Today's Date: 09/03/2012 Time: 1015-1100 Time Calculation (min): 45 min  Short Term Goals: Week 1: SLP Short Term Goal 1 (Week 1): Patient will utilize word finding strategies durign conversation with Supervision level verbal cues. SLP Short Term Goal 2 (Week 1): Patient will utilize external aids to assist with recall of daily information with Supervision level verbal cues. SLP Short Term Goal 3 (Week 1): Patient will demonstrate moderately complex problem solving while completing familiar tasks with Min assist verbal cues.  Skilled Therapeutic Interventions: Skilled treatment session focused on addressing cognition goals.  SLP facilitated session with recipe generation with Supervision level verbal cues to self-monitor and correct perseverative writing errors.  SLP also facilitated session with a categorical naming task and Min assist semantic and question cues to utilize word finding strategies.     FIM:  Comprehension Comprehension Mode: Auditory Comprehension: 5-Understands complex 90% of the time/Cues < 10% of the time Expression Expression Mode: Verbal Expression: 5-Expresses complex 90% of the time/cues < 10% of the time Social Interaction Social Interaction: 5-Interacts appropriately 90% of the time - Needs monitoring or encouragement for participation or interaction. Problem Solving Problem Solving: 6-Solves complex problems: With extra time Memory Memory: 5-Recognizes or recalls 90% of the time/requires cueing < 10% of the time  Pain Pain Assessment Pain Assessment: No/denies pain  Therapy/Group: Individual Therapy  Charlane Ferretti., CCC-SLP [phone]  [name] 09/03/2012, 3:17 PM

## 2012-09-03 NOTE — Progress Notes (Signed)
NUTRITION FOLLOW UP  Intervention:   1. MVI daily  2. Discontinue Ensure Complete, add Resource Breeze - pt prefers this supplement.  3. RD to continue to follow nutrition care plan  Nutrition Dx:   Inadequate oral intake related to poor appetite as evidenced by pt report. Improving.  Goal:   Intake to meet >90% of estimated nutrition needs.  Monitor:   weight trends, lab trends, I/O's, PO intake, supplement tolerance  Assessment:   PMHx of HTN, DM with peripheral neuropathy and previous CVA x 2. Admitted 6/5 with acute nonhemorrhagic infarct.  Diet changed from Heart Healthy to Carbohydrate Modified Medium on 6/11. Oral intake has improved since last RD visit now consuming mostly >50% of meals.  D/C planned for 6/21.  Height: Ht Readings from Last 1 Encounters:  08/26/12 5\' 7"  (1.702 m)    Weight Status:   Wt Readings from Last 1 Encounters:  08/30/12 161 lb 13.1 oz (73.4 kg)  No new weight.  Re-estimated needs:  Kcal: 1650 - 1800 Protein: 75 - 85 grams Fluid: 1.7 - 2 liters  Skin: intact  Diet Order: Carb Control Medium   Intake/Output Summary (Last 24 hours) at 09/03/12 1430 Last data filed at 09/03/12 1410  Gross per 24 hour  Intake    600 ml  Output      0 ml  Net    600 ml    Last BM: 6/11   Labs:   Recent Labs Lab 08/31/12 0525  NA 140  K 3.7  CL 104  CO2 25  BUN 16  CREATININE 0.88  CALCIUM 9.6  GLUCOSE 117*    CBG (last 3)   Recent Labs  09/02/12 1633 09/02/12 2103 09/03/12 0725  GLUCAP 124* 119* 132*    Scheduled Meds: . atorvastatin  40 mg Oral q1800  . citalopram  10 mg Oral Daily  . diclofenac sodium  2 g Topical QID  . dipyridamole-aspirin  1 capsule Oral BID  . enoxaparin (LOVENOX) injection  40 mg Subcutaneous Q24H  . feeding supplement  120 mL Oral TID WC & HS  . insulin aspart  0-9 Units Subcutaneous TID WC  . lisinopril  2.5 mg Oral Daily  . multivitamin with minerals  1 tablet Oral Daily    Continuous  Infusions:   Jarold Motto MS, RD, LDN Pager: [phone] After-hours pager: [phone]

## 2012-09-03 NOTE — Progress Notes (Signed)
Occupational Therapy Session Note  Patient Details  Name: Sophia Rodriguez MRN: [id number] Date of Birth: 09-23-1950  Today's Date: 09/03/2012 Time: 1100-1155 and 1305-1330 Time Calculation (min): 55 min and 25 min   Short Term Goals: Week 1:  OT Short Term Goal 1 (Week 1): Pt will complete LB dressing with supervision for standing balance and no more then 3 verbal cue for sequencing/tech OT Short Term Goal 2 (Week 1): Pt will complete bathing task with supervision and no more then 3 verbal cue for sequencing OT Short Term Goal 3 (Week 1): Pt complete toileting transfer with min assist and 1 verbal cue for motor planning  OT Short Term Goal 4 (Week 1): Pt will complete shower transfer with min assist OT Short Term Goal 5 (Week 1): Pt willl sequence and initiate morning self-care tasks with no more then 2 verbal cues.  Skilled Therapeutic Interventions/Progress Updates:    Session 1: Therapy session focused on ADL training, motor planning during functional transfers, dynamic standing balance, and activity tolerance. Pt completing sit<>stand transfers with min-supervision and cues to scoot forward in chair and for feet placement on 2 occasions. Pt requires occasional cues to reach back for surface during sit<>stand transfers. Pt supervision for bathing task without verbal cues. Pt demonstrated good initiation as she washed all body parts and completed dressing task without cues. Pt ambulated around room to reach into low cabinets with close supervision and increased time as she demonstrates short strides in walking. Planned to complete laundry task however laundry machine in use so pt ambulated back to room approx 60 ft with close supervision and increased time. Pt not sob upon return to room however reported feeling very fatigued.    Session 2: Therapy session focused on activity tolerance, dynamic standing balance, and sit<>stand transfers. Pt ambulated from room to laundry room with close  supervision and increased time secondary to impaired motor planning. Completed laundry task with 1 verbal cue to orient to washing machine as it was different from hers at home. Pt required rest break then engaged in hang man game in standing. Pt completed sit<>stand transfer each time the letter she guessed was not in the puzzle. Completed with steadying-supervision and required 2 rest breaks during task. Pt able to come up with appropriate letters to guess for each puzzle and able to problem solve for answer before all letters were filled in.   Therapy Documentation Precautions:  Precautions Precautions: Fall Precaution Comments: 2-3 falls at home within last month per pt.  Restrictions Weight Bearing Restrictions: No General: General Amount of Missed OT Time (min): 5 Minutes Vital Signs:   Pain: No c/o pain of during therapy session.   See FIM for current functional status  Therapy/Group: Individual Therapy  Rufus Cypert, Vara Guardian 09/03/2012, 12:00 PM

## 2012-09-03 NOTE — Progress Notes (Signed)
Physical Therapy Note  Patient Details  Name: Sophia Rodriguez MRN: [id number] Date of Birth: 06-13-1950 Today's Date: 09/03/2012  1400-1455 (55 minutes) individual Pain: no reported pain Focus of treatment: therapeutic exercise focused on bilateral LE strengthening/activity tolerance; gait training ; therapeutic activities focused on sit >< stand to improve timing of bilateral knee extension Treatment: Transfers stand/turn RW  wc >< Nustep min to SBA RW; Nustep Level 2 X 5 minutes; gait 60 feet RW min to close SBA with decreased step length bilaterally; sit to stand from mat with hands on bedside table to facilitate forward lean min assist with posterior loss of balance (decreased forward trunk lean and vcs to place feet under before standing); standing marching in place with hands on bedside table (decreased single leg stance on right); gait with RW 60 feet marching to increase step length and side to side weight shifts.    Sophia Rodriguez,JIM 09/03/2012, 2:08 PM

## 2012-09-04 ENCOUNTER — Inpatient Hospital Stay (HOSPITAL_COMMUNITY): Payer: Medicare Other | Admitting: Physical Therapy

## 2012-09-04 ENCOUNTER — Inpatient Hospital Stay (HOSPITAL_COMMUNITY): Payer: Medicare Other

## 2012-09-04 ENCOUNTER — Inpatient Hospital Stay (HOSPITAL_COMMUNITY): Payer: Medicare Other | Admitting: Speech Pathology

## 2012-09-04 DIAGNOSIS — G8110 Spastic hemiplegia affecting unspecified side: Secondary | ICD-10-CM

## 2012-09-04 LAB — GLUCOSE, CAPILLARY
Glucose-Capillary: 90 mg/dL (ref 70–99)
Glucose-Capillary: 165 mg/dL — ABNORMAL HIGH (ref 70–99)

## 2012-09-04 NOTE — Progress Notes (Signed)
Occupational Therapy Session Note  Patient Details  Name: Sophia Rodriguez MRN: [id number] Date of Birth: 08-Jan-1951  Today's Date: 09/04/2012 Time: 7829-5621 Time Calculation (min): 45 min  Short Term Goals: Week 1:  OT Short Term Goal 1 (Week 1): Pt will complete LB dressing with supervision for standing balance and no more then 3 verbal cue for sequencing/tech OT Short Term Goal 2 (Week 1): Pt will complete bathing task with supervision and no more then 3 verbal cue for sequencing OT Short Term Goal 3 (Week 1): Pt complete toileting transfer with min assist and 1 verbal cue for motor planning  OT Short Term Goal 4 (Week 1): Pt will complete shower transfer with min assist OT Short Term Goal 5 (Week 1): Pt willl sequence and initiate morning self-care tasks with no more then 2 verbal cues.  Skilled Therapeutic Interventions: ADL-retraining with emphasis on functional transfers (bed, tub bench, toilet), dynamic sitting and standing balance, and safety awareness.   Patient requested female RN-tech for supervision with bathing and dressing but participated with OT on dynamic standing balance, folding and returning clean clothing from laundry to her dresser.  Patient initiation of activity was appropriate and required no further prompts once discussed and agreed upon.   Patient required close supervision and min assist (steadying) during dynamic standing activity (folding cloths) and verbal cue to weight-shift to R-LE while transferring clothing to dresser.    Therapy Documentation Precautions:  Precautions Precautions: Fall Precaution Comments: 2-3 falls at home within last month per pt.  Restrictions Weight Bearing Restrictions: No  Pain: Pain Assessment Pain Assessment: No/denies pain  See FIM for current functional status  Therapy/Group: Individual Therapy  Georgeanne Nim 09/04/2012, 9:28 AM

## 2012-09-04 NOTE — Progress Notes (Signed)
Patient ID: Sophia Rodriguez, female   DOB: 1950/08/20, 62 y.o.   MRN: [id number] Subjective/Complaints: 62 y.o. 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 She was admitted on 08/26/12 with 24 hour history of difficulty walking, decrease in balance as well as increased right sided weakness. MRI of the brain showed acute nonhemorrhagic infarct at the junction of the right thalamus and posterior limb of the right internal capsule as well as remote moderate to large size infarct medial aspect left frontal and parietal lobes as well as left globus pallidus and remote left paracentral small infarct. MRA of the head with atherosclerotic type changes. MRA of the neck without significant stenosis. Echocardiogram with EF 60-65% and source of emboli. Carotid Dopplers with no ICA stenosis. Neurology recommended changing patient to aggrenox for thrombotic stroke due to SVD Slept ok last noc, no BM today thus far  Sitting eob. Denies any problems.   Review of Systems  Neurological: Positive for focal weakness.  All other systems reviewed and are negative.    Objective: Vital Signs: Blood pressure 117/72, pulse 75, temperature 97.7 F (36.5 C), temperature source Oral, resp. rate 18, weight 73.4 kg (161 lb 13.1 oz), SpO2 98.00%. No results found. Results for orders placed during the hospital encounter of 08/30/12 (from the past 72 hour(s))  GLUCOSE, CAPILLARY     Status: Abnormal   Collection Time    09/01/12 11:07 AM      Result Value Range   Glucose-Capillary 109 (*) 70 - 99 mg/dL  GLUCOSE, CAPILLARY     Status: None   Collection Time    09/01/12  4:46 PM      Result Value Range   Glucose-Capillary 88  70 - 99 mg/dL  GLUCOSE, CAPILLARY     Status: Abnormal   Collection Time    09/01/12  8:42 PM      Result Value Range   Glucose-Capillary 167 (*) 70 - 99 mg/dL   GLUCOSE, CAPILLARY     Status: Abnormal   Collection Time    09/02/12  7:25 AM      Result Value Range   Glucose-Capillary 126 (*) 70 - 99 mg/dL  GLUCOSE, CAPILLARY     Status: Abnormal   Collection Time    09/02/12 12:02 PM      Result Value Range   Glucose-Capillary 111 (*) 70 - 99 mg/dL  GLUCOSE, CAPILLARY     Status: Abnormal   Collection Time    09/02/12  4:33 PM      Result Value Range   Glucose-Capillary 124 (*) 70 - 99 mg/dL  GLUCOSE, CAPILLARY     Status: Abnormal   Collection Time    09/02/12  9:03 PM      Result Value Range   Glucose-Capillary 119 (*) 70 - 99 mg/dL   Comment 1 Notify RN    GLUCOSE, CAPILLARY     Status: Abnormal   Collection Time    09/03/12  7:25 AM      Result Value Range   Glucose-Capillary 132 (*) 70 - 99 mg/dL   Comment 1 Notify RN    GLUCOSE, CAPILLARY     Status: None   Collection Time    09/03/12 11:33 AM      Result Value Range   Glucose-Capillary 89  70 - 99 mg/dL   Comment 1 Notify RN    GLUCOSE, CAPILLARY  Status: Abnormal   Collection Time    09/03/12  4:37 PM      Result Value Range   Glucose-Capillary 126 (*) 70 - 99 mg/dL  GLUCOSE, CAPILLARY     Status: Abnormal   Collection Time    09/03/12  9:03 PM      Result Value Range   Glucose-Capillary 130 (*) 70 - 99 mg/dL   Comment 1 Notify RN    GLUCOSE, CAPILLARY     Status: Abnormal   Collection Time    09/04/12  7:23 AM      Result Value Range   Glucose-Capillary 134 (*) 70 - 99 mg/dL      Nursing note and vitals reviewed.  Constitutional: She is oriented to person, place, and time. She appears well-developed and well-nourished.  HENT:  Head: Normocephalic and atraumatic.  Eyes: Pupils are equal, round, and reactive to light.  Neck: Normal range of motion. Neck supple.  Cardiovascular: Normal rate and regular rhythm.  Pulmonary/Chest: Effort normal and breath sounds normal. No respiratory distress. She has no wheezes.  Abdominal: Soft. Bowel sounds are normal.   Musculoskeletal: She exhibits no edema and no tenderness.  Neurological: She is alert and oriented to person, place, and time.  Soft spoken. Speech clear. Some delay in initiation but able to followcommands without difficulty. LUE weakness with decrease in fine motor control. RLE with minimal weakness.  Skin: Skin is warm and dry.  Psychiatric: Her speech is normal and behavior is normal. Thought content normal. Her mood appears a little flat. Cognition and memory are normal.  Motor strength: 4/5 in the right deltoid, biceps, triceps, grip, hip flexor, knee extensors, ankle dorsiflexor plantar flexor  4 minus/5 in the left deltoid, biceps, triceps, grip, hip flexor, knee extensors, ankle dorsiflexor and plantar flexor  Sensation intact to light touch and proprioception in bilateral hands and feet with the exception of left little finger  Visual fields intact to confrontation testing  Cerebellar testing no evidence of ataxia on finger nose to finger and heel-to-shin testing are   Assessment/Plan: 1. 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 Physiatrist is providing close team supervision and 24 hour management of active medical problems listed below. Physiatrist and rehab team continue to assess barriers to discharge/monitor patient progress toward functional and medical goals. Team conference today please see physician documentation under team conference tab, met with team face-to-face to discuss problems,progress, and goals. Formulized individual treatment plan based on medical history, underlying problem and comorbidities. FIM: FIM - Bathing Bathing Steps Patient Completed: Right Arm;Right upper leg;Left upper leg;Abdomen;Left Arm;Buttocks;Front perineal area;Chest;Right lower leg  (including foot);Left lower leg (including foot) Bathing: 5: Supervision: Safety issues/verbal cues  FIM - Upper Body Dressing/Undressing Upper body dressing/undressing steps patient completed: Thread/unthread right bra strap;Thread/unthread left bra strap;Hook/unhook bra;Thread/unthread left sleeve of pullover shirt/dress;Thread/unthread right sleeve of pullover shirt/dresss;Put head through opening of pull over shirt/dress;Pull shirt over trunk Upper body dressing/undressing: 5: Set-up assist to: Obtain clothing/put away FIM - Lower Body Dressing/Undressing Lower body dressing/undressing steps patient completed: Thread/unthread right underwear leg;Pull underwear up/down;Thread/unthread right pants leg;Thread/unthread left pants leg;Thread/unthread left underwear leg;Pull pants up/down;Don/Doff left sock;Don/Doff right sock Lower body dressing/undressing: 4: Steadying Assist  FIM - Toileting Toileting steps completed by patient: Adjust clothing prior to toileting;Adjust clothing after toileting;Performs perineal hygiene Toileting Assistive  Devices: Grab bar or rail for support Toileting: 4: Steadying assist  FIM - Diplomatic Services operational officer Devices: Grab bars;Walker Toilet Transfers: 5-To toilet/BSC: Supervision (verbal cues/safety issues);4-From toilet/BSC: Min A (steadying Pt. > 75%)  FIM - Bed/Chair Transfer Bed/Chair Transfer Assistive Devices: Arm rests;Walker Bed/Chair Transfer: 4: Bed > Chair or W/C: Min A (steadying Pt. > 75%);4: Chair or W/C > Bed: Min A (steadying Pt. > 75%)  FIM - Locomotion: Wheelchair Distance: 30 Locomotion: Wheelchair: 1: Total Assistance/staff pushes wheelchair (Pt<25%) FIM - Locomotion: Ambulation Locomotion: Ambulation Assistive Devices: Designer, industrial/product Ambulation/Gait Assistance: 4: Min guard Locomotion: Ambulation: 2: Travels 50 - 149 ft with minimal assistance (Pt.>75%)  Comprehension Comprehension Mode: Auditory Comprehension:  5-Understands complex 90% of the time/Cues < 10% of the time  Expression Expression Mode: Verbal Expression: 5-Expresses complex 90% of the time/cues < 10% of the time  Social Interaction Social Interaction: 5-Interacts appropriately 90% of the time - Needs monitoring or encouragement for participation or interaction.  Problem Solving Problem Solving: 6-Solves complex problems: With extra time  Memory Memory: 5-Recognizes or recalls 90% of the time/requires cueing < 10% of the time  Medical Problem List and Plan:  1. DVT Prophylaxis/Anticoagulation: Pharmaceutical: Lovenox  2. Pain Management: Will use tylenol prn for pain. Added voltaren gel for left wrist pain. --controlled at present 3. Mood: Flat affect with underlying anxiety noted. Will continue celexa and klonopin. Needs ego support. Will have LCSW follow up for evaluation.  4. Neuropsych: This patient is capable of making decisions on his/her own behalf.  5. DM type 2: Monitor BS with AC/HS checks. Off metformin. Under control 6. HTN: Will monitor with bid checks. Will start low dose ace for better control will close monitoring for hypotension. .  7. Dyslipidemia: LDL--128.   Lipitor  .      LOS (Days) 5 A FACE TO FACE EVALUATION WAS PERFORMED  Nazeer Romney T 09/04/2012, 8:27 AM

## 2012-09-04 NOTE — Progress Notes (Signed)
Physical Therapy Note  Patient Details  Name: Sophia Rodriguez MRN: [id number] Date of Birth: [birth year] Today's Date: 09/04/2012  Time: 1000-1055 55 minutes  1:1 no c/o pain.  Gait with RW in controlled environment with pt with step to gait with R LE, shuffling of R foot during swing.  Pt improves gait pattern with practice at increased speed and 1 HHA, min vs with RW.  Able to facilitate wt shifts and improve to step through gait pattern.  Pt able to carry over to gait with RW with close supervision with verbal cues for step through and foot clearance.  Gait on carpet with pt able to self correct to increase step length on R.  Standing balance training with min-mod A for stepping over and tapping objects to improve R foot clearance and step through.  Pt with LOB with static stance on R, improved with repetition and facilitation for wt shifting.  Standing and seated kinetron for LE strengthening and balance.  Pt able to perform standing with tactile cues and min A for flex R knee and promote L wt shifts.  Overall pt with increased verbalization during session, good motivation to participate.   Armina Galloway 09/04/2012, 10:52 AM

## 2012-09-04 NOTE — Plan of Care (Signed)
Problem: RH BOWEL ELIMINATION Goal: RH STG MANAGE BOWEL WITH ASSISTANCE STG Manage Bowel with mod independence, prn medications to maintain bm q2days.  Outcome: Not Progressing Last bowel movement 09-01-12

## 2012-09-04 NOTE — Progress Notes (Signed)
Speech Language Pathology Daily Session Note  Patient Details  Name: Sophia Rodriguez MRN: [id number] Date of Birth: Dec 24, 1950  Today's Date: 09/04/2012 Time: 0810-0840 Time Calculation (min): 30 min  Short Term Goals: Week 1: SLP Short Term Goal 1 (Week 1): Patient will utilize word finding strategies durign conversation with Supervision level verbal cues. SLP Short Term Goal 2 (Week 1): Patient will utilize external aids to assist with recall of daily information with Supervision level verbal cues. SLP Short Term Goal 3 (Week 1): Patient will demonstrate moderately complex problem solving while completing familiar tasks with Min assist verbal cues.  Skilled Therapeutic Interventions: Skilled intervention completed, with therapy concentrating on cognitive and written expression  activities.  Patient required min A verbal and visual cues to correct misspelled words to complete activity.  She did well with higher level math activities, requiring supervision/cues to figure out sale costs of certain items.  Continue with current treatment plan.   FIM:  Comprehension Comprehension Mode: Auditory Expression Expression Mode: Verbal Problem Solving Problem Solving: 6-Solves complex problems: With extra time Memory Memory: 5-Recognizes or recalls 90% of the time/requires cueing < 10% of the time  Pain Pain Assessment Pain Assessment: No/denies pain  Therapy/Group: Individual Therapy  Lenny Pastel 09/04/2012, 10:32 AM

## 2012-09-05 ENCOUNTER — Ambulatory Visit (HOSPITAL_COMMUNITY): Payer: Medicare Other | Admitting: *Deleted

## 2012-09-05 LAB — GLUCOSE, CAPILLARY
Glucose-Capillary: 112 mg/dL — ABNORMAL HIGH (ref 70–99)
Glucose-Capillary: 121 mg/dL — ABNORMAL HIGH (ref 70–99)
Glucose-Capillary: 135 mg/dL — ABNORMAL HIGH (ref 70–99)

## 2012-09-05 NOTE — Progress Notes (Signed)
Patient ID: Sophia Rodriguez, female   DOB: [birth year], 62 y.o.   MRN: [id number] Subjective/Complaints: 62 y.o. 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 She was admitted on 08/26/12 with 24 hour history of difficulty walking, decrease in balance as well as increased right sided weakness. MRI of the brain showed acute nonhemorrhagic infarct at the junction of the right thalamus and posterior limb of the right internal capsule as well as remote moderate to large size infarct medial aspect left frontal and parietal lobes as well as left globus pallidus and remote left paracentral small infarct. MRA of the head with atherosclerotic type changes. MRA of the neck without significant stenosis. Echocardiogram with EF 60-65% and source of emboli. Carotid Dopplers with no ICA stenosis. Neurology recommended changing patient to aggrenox for thrombotic stroke due to SVD Slept ok last noc, no BM today thus far  Feeling fairly well. Notes pain on bottom of both feet. Describes it as tingling and burning. Improves after she's walked on them a bit.  Review of Systems  Neurological: Positive for focal weakness.  All other systems reviewed and are negative.    Objective: Vital Signs: Blood pressure 112/60, pulse 84, temperature 97.5 F (36.4 C), temperature source Oral, resp. rate 18, weight 73.4 kg (161 lb 13.1 oz), SpO2 99.00%. No results found. Results for orders placed during the hospital encounter of 08/30/12 (from the past 72 hour(s))  GLUCOSE, CAPILLARY     Status: Abnormal   Collection Time    09/02/12 12:02 PM      Result Value Range   Glucose-Capillary 111 (*) 70 - 99 mg/dL  GLUCOSE, CAPILLARY     Status: Abnormal   Collection Time    09/02/12  4:33 PM      Result Value Range   Glucose-Capillary 124 (*) 70 - 99 mg/dL  GLUCOSE, CAPILLARY     Status: Abnormal    Collection Time    09/02/12  9:03 PM      Result Value Range   Glucose-Capillary 119 (*) 70 - 99 mg/dL   Comment 1 Notify RN    GLUCOSE, CAPILLARY     Status: Abnormal   Collection Time    09/03/12  7:25 AM      Result Value Range   Glucose-Capillary 132 (*) 70 - 99 mg/dL   Comment 1 Notify RN    GLUCOSE, CAPILLARY     Status: None   Collection Time    09/03/12 11:33 AM      Result Value Range   Glucose-Capillary 89  70 - 99 mg/dL   Comment 1 Notify RN    GLUCOSE, CAPILLARY     Status: Abnormal   Collection Time    09/03/12  4:37 PM      Result Value Range   Glucose-Capillary 126 (*) 70 - 99 mg/dL  GLUCOSE, CAPILLARY     Status: Abnormal   Collection Time    09/03/12  9:03 PM      Result Value Range   Glucose-Capillary 130 (*) 70 - 99 mg/dL   Comment 1 Notify RN    GLUCOSE, CAPILLARY     Status: Abnormal   Collection Time    09/04/12  7:23 AM      Result Value Range   Glucose-Capillary 134 (*) 70 - 99 mg/dL  GLUCOSE, CAPILLARY     Status: Abnormal   Collection Time  09/04/12 11:08 AM      Result Value Range   Glucose-Capillary 110 (*) 70 - 99 mg/dL  GLUCOSE, CAPILLARY     Status: None   Collection Time    09/04/12  4:19 PM      Result Value Range   Glucose-Capillary 90  70 - 99 mg/dL  GLUCOSE, CAPILLARY     Status: Abnormal   Collection Time    09/04/12  8:30 PM      Result Value Range   Glucose-Capillary 165 (*) 70 - 99 mg/dL   Comment 1 Notify RN    GLUCOSE, CAPILLARY     Status: Abnormal   Collection Time    09/05/12  7:35 AM      Result Value Range   Glucose-Capillary 112 (*) 70 - 99 mg/dL   Comment 1 Documented in Chart     Comment 2 Notify RN        Nursing note and vitals reviewed.  Constitutional: She is oriented to person, place, and time. She appears well-developed and well-nourished.  HENT:  Head: Normocephalic and atraumatic.  Eyes: Pupils are equal, round, and reactive to light.  Neck: Normal range of motion. Neck supple.   Cardiovascular: Normal rate and regular rhythm.  Pulmonary/Chest: Effort normal and breath sounds normal. No respiratory distress. She has no wheezes.  Abdominal: Soft. Bowel sounds are normal.  Musculoskeletal: She exhibits no edema and no tenderness. Normal appearing arches, feet--non tender with ROM/palpation Neurological: She is alert and oriented to person, place, and time.  Soft spoken. Speech clear. Some delay in initiation but able to followcommands without difficulty. LUE weakness with decrease in fine motor control. RLE with minimal weakness.  Skin: Skin is warm and dry.  Psychiatric: Her speech is normal and behavior is normal. Thought content normal. Her mood appears a little flat. Cognition and memory are normal.  Motor strength: 4/5 in the right deltoid, biceps, triceps, grip, hip flexor, knee extensors, ankle dorsiflexor plantar flexor  4 minus/5 in the left deltoid, biceps, triceps, grip, hip flexor, knee extensors, ankle dorsiflexor and plantar flexor  Sensation intact to light touch and proprioception in bilateral hands and feet with the exception of left little finger. Decreased FT bilateral feet/plantar aspects Visual fields intact to confrontation testing  Cerebellar testing no evidence of ataxia on finger nose to finger and heel-to-shin testing are   Assessment/Plan: 1. 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 Physiatrist is providing close team supervision and 24 hour management of active medical problems listed below. Physiatrist and rehab team continue to assess barriers to discharge/monitor patient progress toward functional and medical goals. Team conference today please see physician documentation under team conference tab, met with team face-to-face to  discuss problems,progress, and goals. Formulized individual treatment plan based on medical history, underlying problem and comorbidities. FIM: FIM - Bathing Bathing Steps Patient Completed: Chest;Right Arm;Abdomen;Front perineal area;Buttocks;Right upper leg;Left upper leg;Right lower leg (including foot);Left lower leg (including foot);Left Arm Bathing: 5: Supervision: Safety issues/verbal cues  FIM - Upper Body Dressing/Undressing Upper body dressing/undressing steps patient completed: Thread/unthread right sleeve of pullover shirt/dresss;Thread/unthread left sleeve of pullover shirt/dress;Put head through opening of pull over shirt/dress;Pull shirt over trunk Upper body dressing/undressing: 4: Min-Patient completed 75 plus % of tasks FIM - Lower Body Dressing/Undressing Lower body dressing/undressing steps patient completed:  Thread/unthread right underwear leg;Thread/unthread left underwear leg;Pull underwear up/down;Thread/unthread right pants leg;Thread/unthread left pants leg;Pull pants up/down;Fasten/unfasten pants;Don/Doff right sock;Don/Doff left sock Lower body dressing/undressing: 4: Min-Patient completed 75 plus % of tasks  FIM - Toileting Toileting steps completed by patient: Adjust clothing prior to toileting;Adjust clothing after toileting;Performs perineal hygiene Toileting Assistive Devices: Grab bar or rail for support Toileting: 4: Steadying assist  FIM - Diplomatic Services operational officer Devices: Grab bars;Walker Toilet Transfers: 5-To toilet/BSC: Supervision (verbal cues/safety issues);4-From toilet/BSC: Min A (steadying Pt. > 75%)  FIM - Bed/Chair Transfer Bed/Chair Transfer Assistive Devices: Arm rests;Walker Bed/Chair Transfer: 4: Bed > Chair or W/C: Min A (steadying Pt. > 75%);4: Chair or W/C > Bed: Min A (steadying Pt. > 75%)  FIM - Locomotion: Wheelchair Distance: 30 Locomotion: Wheelchair: 1: Total Assistance/staff pushes wheelchair (Pt<25%) FIM -  Locomotion: Ambulation Locomotion: Ambulation Assistive Devices: Designer, industrial/product Ambulation/Gait Assistance: 4: Min guard Locomotion: Ambulation: 2: Travels 50 - 149 ft with minimal assistance (Pt.>75%)  Comprehension Comprehension Mode: Auditory Comprehension: 5-Understands complex 90% of the time/Cues < 10% of the time  Expression Expression Mode: Verbal Expression: 5-Expresses complex 90% of the time/cues < 10% of the time  Social Interaction Social Interaction: 5-Interacts appropriately 90% of the time - Needs monitoring or encouragement for participation or interaction.  Problem Solving Problem Solving: 6-Solves complex problems: With extra time  Memory Memory: 5-Recognizes or recalls 90% of the time/requires cueing < 10% of the time  Medical Problem List and Plan:  1. DVT Prophylaxis/Anticoagulation: Pharmaceutical: Lovenox  2. Pain Management: Will use tylenol prn for pain. Added voltaren gel for left wrist pain. --controlled at present 3. Mood: Flat affect with underlying anxiety noted. Will continue celexa and klonopin. Needs ego support. Will have LCSW follow up for evaluation.  4. Neuropsych: This patient is capable of making decisions on his/her own behalf.  5. DM type 2 with neuropathy: Monitor BS with AC/HS checks. Off metformin. Under control  -follow for increased pain---minor at present. 6. HTN: Will monitor with bid checks. on low dose ace for better control will close monitoring for hypotension. .  7. Dyslipidemia: LDL--128.   Lipitor  .      LOS (Days) 6 A FACE TO FACE EVALUATION WAS PERFORMED  Fumio Vandam T 09/05/2012, 8:10 AM

## 2012-09-06 ENCOUNTER — Inpatient Hospital Stay (HOSPITAL_COMMUNITY): Payer: Medicare Other

## 2012-09-06 ENCOUNTER — Ambulatory Visit: Payer: Medicare Other | Admitting: Physical Therapy

## 2012-09-06 ENCOUNTER — Inpatient Hospital Stay (HOSPITAL_COMMUNITY): Payer: Medicare Other | Admitting: Speech Pathology

## 2012-09-06 ENCOUNTER — Inpatient Hospital Stay (HOSPITAL_COMMUNITY): Payer: Medicare Other | Admitting: *Deleted

## 2012-09-06 DIAGNOSIS — G8110 Spastic hemiplegia affecting unspecified side: Secondary | ICD-10-CM

## 2012-09-06 DIAGNOSIS — I6330 Cerebral infarction due to thrombosis of unspecified cerebral artery: Secondary | ICD-10-CM

## 2012-09-06 LAB — GLUCOSE, CAPILLARY
Glucose-Capillary: 129 mg/dL — ABNORMAL HIGH (ref 70–99)
Glucose-Capillary: 117 mg/dL — ABNORMAL HIGH (ref 70–99)
Glucose-Capillary: 147 mg/dL — ABNORMAL HIGH (ref 70–99)

## 2012-09-06 LAB — CREATININE, SERUM
GFR calc Af Amer: 85 mL/min — ABNORMAL LOW (ref >90)
GFR calc non Af Amer: 73 mL/min — ABNORMAL LOW (ref >90)

## 2012-09-06 NOTE — Plan of Care (Signed)
Problem: RH Grooming Goal: LTG Patient will perform grooming w/assist,cues/equip (OT) LTG: Patient will perform grooming with assist, with/without cues using equipment (OT)  Upgraded all self-care goals to Mod I secondary to progress in therapy

## 2012-09-06 NOTE — Progress Notes (Signed)
Physical Therapy Session Note  Patient Details  Name: Sophia Rodriguez MRN: [id number] Date of Birth: [birth year]  Today's Date: 09/06/2012 Time: 0830-0930 Time Calculation (min): 60 min  Short Term Goals: Week 1:  PT Short Term Goal 1 (Week 1): Patient will perform bed mobility with mod I. PT Short Term Goal 2 (Week 1): Patient will perform stand pivot transfers with LRAD and supervision. PT Short Term Goal 3 (Week 1): Patient will ambulate 150' in controlled environment with LRAD and supervision. PT Short Term Goal 4 (Week 1): Patient will negotiate 5 steps with one handrail with supervision (for community access).  Skilled Therapeutic Interventions/Progress Updates:    Patient received sitting in wheelchair. Session focused on gait training in controlled and home environments on carpet and tile (in ADL apartment and ADL kitchen) and transfers from low, cushioned surfaces. In ADL kitchen, patient instructed to locate all the kitchen equipment necessary to prepare a meal later. Patient able to navigate through kitchen, bend over to look in low cabinets, reach up and open high cabinets, etc. Patient reports she sits in recliner at home. Sit<>stand transfers practiced in solarium on low, cushioned chair. Discussion with patient about upgrading goals from supervision to mod I; team in agreement, patient verbalizes understanding and is in agreement. Discussion about recommendation for use of RW instead of SPC at home. Additionally, discussion about use of w/c for longer community distances and patient reports her daughter will be getting the w/c that pt owns that is being stored at a friend's home. Patient returned to room and left seated in wheelchair with all needs within reach.  Therapy Documentation Precautions:  Precautions Precautions: Fall Precaution Comments: 2-3 falls at home within last month per pt.  Restrictions Weight Bearing Restrictions: No Pain: Pain Assessment Pain Assessment:  No/denies pain Pain Score: 0-No pain Locomotion : Ambulation Ambulation: Yes Ambulation/Gait Assistance: 5: Supervision Ambulation Distance (Feet): 155 Feet x1, 165' x1, 95' x1, several small bouts in ADL apartment/kitchen Assistive device: Rolling walker; straight cane Ambulation/Gait Assistance Details: Verbal cues for precautions/safety;Verbal cues for gait pattern Ambulation/Gait Assistance Details: Patient instruted in gait training 155' x1 and 165' x1 with RW and supervision; over thresholds, on carpet and tile in ADL apartment and kitchen. Patient continues to require verbal cues for R LE clearance, especially on carpeted surfaces. Patient instructed in gait training 95' x1 with SPC and min guard; patient demonstrates decreased step length on R and decreased stride length overall; decreased gait speed and increased lateral trunk sway. Gait Gait: Yes Gait Pattern: Decreased step length - right;Decreased weight shift to right;Decreased dorsiflexion - right;Shuffle;Decreased hip/knee flexion - right;Decreased hip/knee flexion - left;Decreased stance time - left;Step-through pattern Wheelchair Mobility Wheelchair Mobility: No   See FIM for current functional status  Therapy/Group: Co-Treatment with Rec Therapy  Clarisse Gouge S Berta Denson S. Autry Prust, PT, DPT  09/06/2012, 12:05 PM

## 2012-09-06 NOTE — Progress Notes (Signed)
Speech Language Pathology Daily Session Note  Patient Details  Name: Sophia Rodriguez MRN: [id number] Date of Birth: 10-07-1950  Today's Date: 09/06/2012 Time: 1305-1330 Time Calculation (min): 25 min  Short Term Goals: Week 1: SLP Short Term Goal 1 (Week 1): Patient will utilize word finding strategies durign conversation with Supervision level verbal cues. SLP Short Term Goal 2 (Week 1): Patient will utilize external aids to assist with recall of daily information with Supervision level verbal cues. SLP Short Term Goal 3 (Week 1): Patient will demonstrate moderately complex problem solving while completing familiar tasks with Min assist verbal cues.  Skilled Therapeutic Interventions: Skilled treatment session focused on addressing cooking task of patient's choice.  Patient required increased wait time to organize, retrieve and setup preparation of dish.  Patient required Supervision level verbal cues 1-2 times for safety with rolling walker.     FIM:  Comprehension Comprehension Mode: Auditory Comprehension: 5-Understands complex 90% of the time/Cues < 10% of the time Expression Expression Mode: Verbal Expression: 5-Expresses complex 90% of the time/cues < 10% of the time Social Interaction Social Interaction: 5-Interacts appropriately 90% of the time - Needs monitoring or encouragement for participation or interaction. Problem Solving Problem Solving: 6-Solves complex problems: With extra time Memory Memory: 5-Recognizes or recalls 90% of the time/requires cueing < 10% of the time  Pain Pain Assessment Pain Assessment: No/denies pain  Therapy/Group: Individual Therapy  Charlane Ferretti., CCC-SLP [phone]  [name] 09/06/2012, 4:15 PM

## 2012-09-06 NOTE — Progress Notes (Signed)
Patient ID: Sophia Rodriguez, female   DOB: [birth year], 62 y.o.   MRN: [id number] Subjective/Complaints: 62 y.o. 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 She was admitted on 08/26/12 with 24 hour history of difficulty walking, decrease in balance as well as increased right sided weakness. MRI of the brain showed acute nonhemorrhagic infarct at the junction of the right thalamus and posterior limb of the right internal capsule as well as remote moderate to large size infarct medial aspect left frontal and parietal lobes as well as left globus pallidus and remote left paracentral small infarct. MRA of the head with atherosclerotic type changes. MRA of the neck without significant stenosis. Echocardiogram with EF 60-65% and source of emboli. Carotid Dopplers with no ICA stenosis. Neurology recommended changing patient to aggrenox for thrombotic stroke due to SVD Slept ok last noc, no BM today thus far  No pain  Review of Systems  Neurological: Positive for focal weakness.  All other systems reviewed and are negative.    Objective: Vital Signs: Blood pressure 124/80, pulse 91, temperature 97.3 F (36.3 C), temperature source Oral, resp. rate 20, weight 73.4 kg (161 lb 13.1 oz), SpO2 100.00%. No results found. Results for orders placed during the hospital encounter of 08/30/12 (from the past 72 hour(s))  GLUCOSE, CAPILLARY     Status: None   Collection Time    09/03/12 11:33 AM      Result Value Range   Glucose-Capillary 89  70 - 99 mg/dL   Comment 1 Notify RN    GLUCOSE, CAPILLARY     Status: Abnormal   Collection Time    09/03/12  4:37 PM      Result Value Range   Glucose-Capillary 126 (*) 70 - 99 mg/dL  GLUCOSE, CAPILLARY     Status: Abnormal   Collection Time    09/03/12  9:03 PM      Result Value Range   Glucose-Capillary 130 (*) 70 - 99 mg/dL    Comment 1 Notify RN    GLUCOSE, CAPILLARY     Status: Abnormal   Collection Time    09/04/12  7:23 AM      Result Value Range   Glucose-Capillary 134 (*) 70 - 99 mg/dL  GLUCOSE, CAPILLARY     Status: Abnormal   Collection Time    09/04/12 11:08 AM      Result Value Range   Glucose-Capillary 110 (*) 70 - 99 mg/dL  GLUCOSE, CAPILLARY     Status: None   Collection Time    09/04/12  4:19 PM      Result Value Range   Glucose-Capillary 90  70 - 99 mg/dL  GLUCOSE, CAPILLARY     Status: Abnormal   Collection Time    09/04/12  8:30 PM      Result Value Range   Glucose-Capillary 165 (*) 70 - 99 mg/dL   Comment 1 Notify RN    GLUCOSE, CAPILLARY     Status: Abnormal   Collection Time    09/05/12  7:35 AM      Result Value Range   Glucose-Capillary 112 (*) 70 - 99 mg/dL   Comment 1 Documented in Chart     Comment 2 Notify RN    GLUCOSE, CAPILLARY     Status: Abnormal   Collection Time    09/05/12 11:42 AM      Result Value Range  Glucose-Capillary 100 (*) 70 - 99 mg/dL   Comment 1 Documented in Chart     Comment 2 Notify RN    GLUCOSE, CAPILLARY     Status: Abnormal   Collection Time    09/05/12  4:54 PM      Result Value Range   Glucose-Capillary 121 (*) 70 - 99 mg/dL   Comment 1 Documented in Chart     Comment 2 Notify RN    GLUCOSE, CAPILLARY     Status: Abnormal   Collection Time    09/05/12  9:01 PM      Result Value Range   Glucose-Capillary 135 (*) 70 - 99 mg/dL   Comment 1 Notify RN    CREATININE, SERUM     Status: Abnormal   Collection Time    09/06/12  5:01 AM      Result Value Range   Creatinine, Ser 0.85  0.50 - 1.10 mg/dL   GFR calc non Af Amer 73 (*) >90 mL/min   GFR calc Af Amer 85 (*) >90 mL/min   Comment:            The eGFR has been calculated     using the CKD EPI equation.     This calculation has not been     validated in all clinical     situations.     eGFR's persistently     <90 mL/min signify     possible Chronic Kidney Disease.  GLUCOSE,  CAPILLARY     Status: Abnormal   Collection Time    09/06/12  7:20 AM      Result Value Range   Glucose-Capillary 129 (*) 70 - 99 mg/dL   Comment 1 Notify RN        Nursing note and vitals reviewed.  Constitutional: She is oriented to person, place, and time. She appears well-developed and well-nourished.  HENT:  Head: Normocephalic and atraumatic.  Eyes: Pupils are equal, round, and reactive to light.  Neck: Normal range of motion. Neck supple.  Cardiovascular: Normal rate and regular rhythm.  Pulmonary/Chest: Effort normal and breath sounds normal. No respiratory distress. She has no wheezes.  Abdominal: Soft. Bowel sounds are normal.  Musculoskeletal: She exhibits no edema and no tenderness. Normal appearing arches, feet--non tender with ROM/palpation Neurological: She is alert and oriented to person, place, and time.  Soft spoken. Speech clear. Some delay in initiation but able to followcommands without difficulty. LUE weakness with decrease in fine motor control. RLE with minimal weakness.  Skin: Skin is warm and dry.  Psychiatric: Her speech is normal and behavior is normal. Thought content normal. Her mood appears a little flat. Cognition and memory are normal.  Motor strength: 4/5 in the right deltoid, biceps, triceps, grip, hip flexor, knee extensors, ankle dorsiflexor plantar flexor  4 minus/5 in the left deltoid, biceps, triceps, grip, hip flexor, knee extensors, ankle dorsiflexor and plantar flexor  Sensation intact to light touch and proprioception in bilateral hands and feet with the exception of left little finger. Decreased FT bilateral feet/plantar aspects Visual fields intact to confrontation testing  Cerebellar testing no evidence of ataxia on finger nose to finger and heel-to-shin testing are   Assessment/Plan: 1. Functional deficits secondary to acute nonhemorrhagic infarct at the junction of the right thalamus and posterior limb of the right internal capsule   which require 3+ hours per day of interdisciplinary therapy in a comprehensive inpatient rehab setting. Physiatrist is providing close team supervision and 24 hour  management of active medical problems listed below. Physiatrist and rehab team continue to assess barriers to discharge/monitor patient progress toward functional and medical goals.  Pt slowly progressing overall min/min guard levels FIM: FIM - Bathing Bathing Steps Patient Completed: Chest;Right Arm;Abdomen;Front perineal area;Buttocks;Right upper leg;Left upper leg;Right lower leg (including foot);Left lower leg (including foot);Left Arm Bathing: 5: Supervision: Safety issues/verbal cues  FIM - Upper Body Dressing/Undressing Upper body dressing/undressing steps patient completed: Thread/unthread right sleeve of pullover shirt/dresss;Thread/unthread left sleeve of pullover shirt/dress;Put head through opening of pull over shirt/dress;Pull shirt over trunk Upper body dressing/undressing: 4: Min-Patient completed 75 plus % of tasks FIM - Lower Body Dressing/Undressing Lower body dressing/undressing steps patient completed: Thread/unthread right underwear leg;Thread/unthread left underwear leg;Pull underwear up/down;Thread/unthread right pants leg;Thread/unthread left pants leg;Pull pants up/down;Fasten/unfasten pants;Don/Doff right sock;Don/Doff left sock Lower body dressing/undressing: 4: Min-Patient completed 75 plus % of tasks  FIM - Toileting Toileting steps completed by patient: Performs perineal hygiene;Adjust clothing prior to toileting;Adjust clothing after toileting Toileting Assistive Devices: Grab bar or rail for support Toileting: 4: Steadying assist  FIM - Diplomatic Services operational officer Devices: Grab bars Toilet Transfers: 4-To toilet/BSC: Min A (steadying Pt. > 75%);4-From toilet/BSC: Min A (steadying Pt. > 75%)  FIM - Bed/Chair Transfer Bed/Chair Transfer Assistive Devices: Arm rests;Walker Bed/Chair  Transfer: 4: Bed > Chair or W/C: Min A (steadying Pt. > 75%);4: Chair or W/C > Bed: Min A (steadying Pt. > 75%)  FIM - Locomotion: Wheelchair Distance: 30 Locomotion: Wheelchair: 1: Total Assistance/staff pushes wheelchair (Pt<25%) FIM - Locomotion: Ambulation Locomotion: Ambulation Assistive Devices: Designer, industrial/product Ambulation/Gait Assistance: 4: Min guard Locomotion: Ambulation: 2: Travels 50 - 149 ft with minimal assistance (Pt.>75%)  Comprehension Comprehension Mode: Auditory Comprehension: 5-Understands complex 90% of the time/Cues < 10% of the time  Expression Expression Mode: Verbal Expression: 5-Expresses complex 90% of the time/cues < 10% of the time  Social Interaction Social Interaction: 5-Interacts appropriately 90% of the time - Needs monitoring or encouragement for participation or interaction.  Problem Solving Problem Solving: 6-Solves complex problems: With extra time  Memory Memory: 5-Recognizes or recalls 90% of the time/requires cueing < 10% of the time  Medical Problem List and Plan:  1. DVT Prophylaxis/Anticoagulation: Pharmaceutical: Lovenox  2. Pain Management: Will use tylenol prn for pain. Added voltaren gel for left wrist pain. --controlled at present 3. Mood: Flat affect with underlying anxiety noted. Will continue celexa and klonopin. Needs ego support. Will have LCSW follow up for evaluation.  4. Neuropsych: This patient is capable of making decisions on his/her own behalf.  5. DM type 2 with neuropathy: Monitor BS with AC/HS checks. Off metformin. Under control  -follow for increased pain---minor at present. 6. HTN: Will monitor with bid checks. on low dose ace for better control will close monitoring for hypotension. .  7. Dyslipidemia: LDL--128.   Lipitor  .      LOS (Days) 7 A FACE TO FACE EVALUATION WAS PERFORMED  Johnnell Liou E 09/06/2012, 8:25 AM

## 2012-09-06 NOTE — Progress Notes (Signed)
Recreational Therapy Session Note  Patient Details  Name: Haydyn Liddell MRN: [id number] Date of Birth: 10-Apr-1950 Today's Date: 09/06/2012  Time:  840-930 Pain: no c/o Skilled Therapeutic Interventions/Progress Updates: Session focused on discharge planning as it pertains to community pursuits, leisure interests, and return home.  Discussed with pt purpose of outing and potential to schedule this week, pt agreeable stating a lunch outing would be realistic for her.  Also practiced functional transfers in [place] with supervision- min assist and min cues from high back chair with armrests.  Therapy/Group: Co-Treatment  Tallen Schnorr 09/06/2012, 9:33 AM

## 2012-09-06 NOTE — Progress Notes (Signed)
Occupational Therapy Session Note  Patient Details  Name: Sophia Rodriguez MRN: [id number] Date of Birth: 02-24-1951  Today's Date: 09/06/2012 Time: 1115-1155 and 1330-1430 Time Calculation (min): 40 min and 60 min   Short Term Goals: Week 1:  OT Short Term Goal 1 (Week 1): Pt will complete LB dressing with supervision for standing balance and no more then 3 verbal cue for sequencing/tech OT Short Term Goal 2 (Week 1): Pt will complete bathing task with supervision and no more then 3 verbal cue for sequencing OT Short Term Goal 3 (Week 1): Pt complete toileting transfer with min assist and 1 verbal cue for motor planning  OT Short Term Goal 4 (Week 1): Pt will complete shower transfer with min assist OT Short Term Goal 5 (Week 1): Pt willl sequence and initiate morning self-care tasks with no more then 2 verbal cues.  Skilled Therapeutic Interventions/Progress Updates:    Session 1: Therapy session focused on ADL retraining, functional transfers, activity tolerance, and awareness. Pt ambulated from w/c in room to toilet with RW and steadying assist with increased time. Completed toileting task with supervision. Ambulated to walk-in shower with steadying assist-sup and completed bathing with supervision. Ambulated out of bathroom to w/c with close supervision and increased time. Required setup assist for dressing. Required one verbal cue throughout therapy session for safety with sit<>stand transfer to reach back for arm rest and push up from arm rest. Pt required 3 rest breaks total during therapy session. Ambulated short distances in room with RW and supervision to hang up towels and put away dirty clothes. Pt recalled cooking task planned for this afternoon and verbalized ingredients for task.   Session 2: Therapy session focused on activity tolerance, dynamic standing balance, functional transfers, and safety with higher level IADL task of cooking. Pt received after speech therapy who initiated  cooking task with retrieval and setup to prepare dish of pt's choice. Pt demonstrated good safety throughout task requiring a couple cues for safety with RW to get closer to area where she was reaching for item. Good safety with using oven and setup as pt chose to use stove top burners closest to her rather having to reach over top a hot one. Pt completed cooking task in standing with approx 3-4 rest breaks. Educated pt on being aware of where she could sit for rest breaks at home if she became fatigued while cooking. Pt with good carryover of sliding items down counter to place in sink and bring to oven without cues. Good safety with RW and using counter for support when reaching into oven to remove dish. Pt completed entire cooking task with supervision. While items were in oven pt practiced tub transfer with supervision.   Therapy Documentation Precautions:  Precautions Precautions: Fall Precaution Comments: 2-3 falls at home within last month per pt.  Restrictions Weight Bearing Restrictions: No General:   Vital Signs:   Pain: No c/o pain during therapy sessions  See FIM for current functional status  Therapy/Group: Individual Therapy  Daneil Dan 09/06/2012, 11:58 AM

## 2012-09-07 ENCOUNTER — Encounter: Payer: Medicare Other | Admitting: Occupational Therapy

## 2012-09-07 ENCOUNTER — Inpatient Hospital Stay (HOSPITAL_COMMUNITY): Payer: Medicare Other | Admitting: Speech Pathology

## 2012-09-07 ENCOUNTER — Ambulatory Visit (HOSPITAL_COMMUNITY): Payer: Medicare Other | Admitting: *Deleted

## 2012-09-07 ENCOUNTER — Inpatient Hospital Stay (HOSPITAL_COMMUNITY): Payer: Medicare Other

## 2012-09-07 ENCOUNTER — Encounter (HOSPITAL_COMMUNITY): Payer: Medicare Other

## 2012-09-07 ENCOUNTER — Inpatient Hospital Stay (HOSPITAL_COMMUNITY): Payer: Medicare Other | Admitting: *Deleted

## 2012-09-07 LAB — BASIC METABOLIC PANEL
Chloride: 104 mEq/L (ref 96–112)
GFR calc Af Amer: 66 mL/min — ABNORMAL LOW (ref >90)
Potassium: 3.9 mEq/L (ref 3.5–5.1)
Sodium: 139 mEq/L (ref 135–145)

## 2012-09-07 LAB — URINALYSIS, ROUTINE W REFLEX MICROSCOPIC
Ketones, ur: NEGATIVE mg/dL
Nitrite: NEGATIVE
Specific Gravity, Urine: 1.025 (ref 1.005–1.030)
pH: 5 (ref 5.0–8.0)

## 2012-09-07 LAB — URINE MICROSCOPIC-ADD ON

## 2012-09-07 LAB — GLUCOSE, CAPILLARY
Glucose-Capillary: 112 mg/dL — ABNORMAL HIGH (ref 70–99)
Glucose-Capillary: 158 mg/dL — ABNORMAL HIGH (ref 70–99)

## 2012-09-07 MED ORDER — BOOST / RESOURCE BREEZE PO LIQD
1.0000 | Freq: Three times a day (TID) | ORAL | Status: DC
  Administered 2012-09-07 – 2012-09-10 (×3): 1 via ORAL

## 2012-09-07 NOTE — Progress Notes (Signed)
Recreational Therapy Session Note  Patient Details  Name: Sophia Rodriguez MRN: [id number] Date of Birth: [birth year] Today's Date: 09/07/2012 Pain: no c/o Skilled Therapeutic Interventions/Progress Updates: Pt sitting in w/c watching TV.  Attempted 45 minute scheduled session with pt, pt refused even with encouragement stating "I just don't feel like it."  Attempted to provide emotional support, but pt politely dismissed therapist saying she "would be ok, hopefully tomorrow would be better.  Don't worry about me"  Offered diversional activities of pt's choice in which she also declined.     Kesler Wickham 09/07/2012, 3:01 PM

## 2012-09-07 NOTE — Progress Notes (Signed)
Occupational Therapy Weekly Progress Note  Patient Details  Name: Sophia Rodriguez MRN: [id number] Date of Birth: 09/29/1950  Today's Date: 09/07/2012    Patient has met 5 of 5 short term goals. Patient has been progressing well with therapy however continues to require increased time for all self-care tasks and functional transfers secondary to impaired motor planning. Pt engaged in higher level IADL activity of cooking and demonstrated good safety, planning, and sequencing throughout kitchen and during cooking task. LTGs upgraded to Mod I overall for self-care tasks. Planned on community reintegration activity to hospital gift shop on this date however unable to complete d/t pt refusal of therapy sessions.   Patient continues to demonstrate the following deficits: impaired motor planning, decreased activity tolerance, decreased strength, decreased balance, decreased coordination and therefore will continue to benefit from skilled OT intervention to enhance overall performance with BADL.  Patient progressing toward long term goals..  Continue plan of care.  OT Short Term Goals Week 1:  OT Short Term Goal 1 (Week 1): Pt will complete LB dressing with supervision for standing balance and no more then 3 verbal cue for sequencing/tech OT Short Term Goal 1 - Progress (Week 1): Met OT Short Term Goal 2 (Week 1): Pt will complete bathing task with supervision and no more then 3 verbal cue for sequencing OT Short Term Goal 2 - Progress (Week 1): Met OT Short Term Goal 3 (Week 1): Pt complete toileting transfer with min assist and 1 verbal cue for motor planning  OT Short Term Goal 3 - Progress (Week 1): Met OT Short Term Goal 4 (Week 1): Pt will complete shower transfer with min assist OT Short Term Goal 4 - Progress (Week 1): Met OT Short Term Goal 5 (Week 1): Pt willl sequence and initiate morning self-care tasks with no more then 2 verbal cues. OT Short Term Goal 5 - Progress (Week 1): Met Week 2:   OT Short Term Goal 1 (Week 2): STGs=LTGs  Skilled Therapeutic Interventions/Progress Updates:      Therapy Documentation Precautions:  Precautions Precautions: Fall Precaution Comments: 2-3 falls at home within last month per pt.  Restrictions Weight Bearing Restrictions: No General: General Amount of Missed OT Time (min): 60 Minutes Vital Signs:   Pain:   ADL:   Exercises:   Other Treatments:    See FIM for current functional status  Therapy/Group: Individual Therapy  Daneil Dan 09/07/2012, 1:22 PM

## 2012-09-07 NOTE — Progress Notes (Signed)
Occupational Therapy Note  Patient Details  Name: Sophia Rodriguez MRN: [id number] Date of Birth: 1950-11-22 Today's Date: 09/07/2012  Cancelled treatment: Patient missing 60 min skilled OT services this PM reporting "I just don't feel like it." Attempted to provide emotional support however pt reported not knowing what was wrong. Provided encouragement and several options for therapy session however pt continued to decline therapy on this date stating "I will tomorrow."   Sophia Rodriguez, Vara Guardian 09/07/2012, 1:20 PM

## 2012-09-07 NOTE — Progress Notes (Signed)
SLP Cancellation Note  Patient Details Name: Sophia Rodriguez MRN: [id number] DOB: May 19, 1950   Cancelled treatment:         Patient missed 30 minutes of skilled SLP services due to patient report of not felling up to it.  Patient unable to state exactly what was wrong; patient tearfully requested to skip today's session.  Fae Pippin, M.A., CCC-SLP [phone]  Sophia Rodriguez 09/07/2012, 10:48 AM

## 2012-09-07 NOTE — Plan of Care (Signed)
Problem: RH Wheelchair Mobility Goal: LTG Patient will propel w/c in controlled environment (PT) LTG: Patient will propel wheelchair in controlled environment, # of feet with assist (PT)  Outcome: Not Applicable Date Met:  09/07/12 Discontinued this wheelchair mobility goal secondary to patient only requiring use of wheelchair in community environments for longer distances due to decreased activity tolerance. Goal: LTG Patient will propel w/c in home environment (PT) LTG: Patient will propel wheelchair in home environment, # of feet with assistance (PT).  Outcome: Not Applicable Date Met:  09/07/12 Discontinued this wheelchair mobility goal secondary to patient only requiring use of wheelchair in community environments for longer distances due to decreased activity tolerance.

## 2012-09-07 NOTE — Progress Notes (Signed)
Occupational Therapy Note  Patient Details  Name: Sophia Rodriguez MRN: [id number] Date of Birth: October 19, 1950 Today's Date: 09/07/2012  Cancelled treatment: Patient missed 45 min of skilled OT therapy this session stating she didn't know what was wrong she just did not want to participate in therapy on this date. Provided education on benefits and encouragement on pt's progress. Pt appeared to begin to become tearful however able to tears back. Pt agreed to participate in skilled OT session at 1300 on this date.    Latorsha Curling N 09/07/2012, 12:04 PM

## 2012-09-07 NOTE — Progress Notes (Signed)
Physical Therapy Weekly Progress Note  Patient Details  Name: Sophia Rodriguez MRN: [id number] Date of Birth: [birth year]  Today's Date: 09/07/2012  Patient has met 3 of 4 short term goals. Stair goal not met secondary to plans for stair negotiation to be performed on today's date, but patient refusing to participate with therapy this session. However, patient has made excellent progress in rehab and therefore, LTGs have been upgraded to overall mod I level (maintain S level for car transfers, community ambulation, stair negotiation, and community wheelchair mobility secondary to patient's daughter always present for these situations). Discontinued wheelchair mobility goals for controlled environment and home environments secondary to patient only requiring use of wheelchair in the community. Planned to initiate gait training with R toe-off AFO on this date, however, unable secondary to patient refusal. Have contacted orthotist to assess patient this week. Patient reports she was participating in outpatient PT PTA and was going to be provided with "a brace" for her R LE.  Patient continues to demonstrate the following deficits: decreased strength, decreased activity tolerance, decreased balance and balance reactions, decreased postural control with mobility, decreased awareness, and decreased coordination, and therefore will continue to benefit from skilled PT intervention to enhance overall performance with activity tolerance, balance, postural control, ability to compensate for deficits, awareness and coordination.  Patient progressing toward long term goals..  Plan of care revisions: LTGs upgraded to overall mod I level on 09/06/12. (maintain S level for car transfers, community ambulation, stair negotiation, and community wheelchair mobility secondary to patient's daughter always present for these situations). Discontinued wheelchair mobility goals for controlled environment and home environments secondary  to patient only requiring use of wheelchair in the community.  PT Short Term Goals Week 1:  PT Short Term Goal 1 (Week 1): Patient will perform bed mobility with mod I. PT Short Term Goal 1 - Progress (Week 1): Met PT Short Term Goal 2 (Week 1): Patient will perform stand pivot transfers with LRAD and supervision. PT Short Term Goal 2 - Progress (Week 1): Met PT Short Term Goal 3 (Week 1): Patient will ambulate 150' in controlled environment with LRAD and supervision. PT Short Term Goal 3 - Progress (Week 1): Met PT Short Term Goal 4 (Week 1): Patient will negotiate 5 steps with one handrail with supervision (for community access). PT Short Term Goal 4 - Progress (Week 1): Progressing toward goal Week 2:  PT Short Term Goal 1 (Week 2): STGs=LTGs (upgraded to mod I overall on 09/06/12)  Skilled Therapeutic Interventions/Progress Updates:    Patient received sitting in wheelchair. Patient missed 60 minutes of skilled physical therapy this AM secondary to refusal to participate. Patient scheduled for co-treatment session of PT with Rec Therapy, however patient refuses to participate this session. Patient reports "I just don't feel like doing it". Patient without c/o of pain, nausea, dizziness. Patient asked if she is nervous about upcoming discharge and she denies. Patient reports she does not have any needs at this time. Patient left sitting in wheelchair. Followed up 15 minutes later at 0900 to attempt to have patient participate and patient continues to refuse therapy. Will follow up as able.  Therapy Documentation Precautions:  Precautions Precautions: Fall Precaution Comments: 2-3 falls at home within last month per pt.  Restrictions Weight Bearing Restrictions: No General: Amount of Missed PT Time (min): 60 Minutes Missed Time Reason: Patient unwilling/refused to participate without medical reason Pain: Pain Assessment Pain Assessment: No/denies pain Pain Score: 0-No pain  See FIM  for current functional status  Therapy/Group: Co-Treatment scheduled with Rec Therapy, however, patient refuses to participate during this session  [name] Shirlette Scarber, PT, DPT  09/07/2012, 9:12 AM

## 2012-09-07 NOTE — Progress Notes (Signed)
Recreational Therapy Session Note  Patient Details  Name: Ekta Dancer MRN: [id number] Date of Birth: 08/15/1950 Today's Date: 09/07/2012 Pain: no c/o  Pt refused co-treat session scheduled from 830-9 with PT.  Pt states " I just don't feel like doing it".  RN reports " BP a little low"  this morning, but pt without complaints.  Pt somewhat tearful during interaction, but states she is ok, just doesn't feel well.  RN aware of the above.  Anasia Agro 09/07/2012, 8:50 AM

## 2012-09-07 NOTE — Progress Notes (Signed)
Social Work Patient ID: Sophia Rodriguez, female   DOB: September 03, 1950, 62 y.o.   MRN: [id number] Met with pt to see how she is feeling.  She reports she has not felt well today and this is the reason for refusing therapies. Discussed the importance of attending therapies and trying.  Aware may move up discharge date to Friday but will confirm at team conference  Tomorrow.  Hopefully pt will feel better tomorrow and participate.

## 2012-09-07 NOTE — Progress Notes (Signed)
Patient ID: Sophia Rodriguez, female   DOB: 09/03/1950, 62 y.o.   MRN: [id number] Subjective/Complaints: 62 y.o. 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 She was admitted on 08/26/12 with 24 hour history of difficulty walking, decrease in balance as well as increased right sided weakness. MRI of the brain showed acute nonhemorrhagic infarct at the junction of the right thalamus and posterior limb of the right internal capsule as well as remote moderate to large size infarct medial aspect left frontal and parietal lobes as well as left globus pallidus and remote left paracentral small infarct. MRA of the head with atherosclerotic type changes. MRA of the neck without significant stenosis. Echocardiogram with EF 60-65% and source of emboli. Carotid Dopplers with no ICA stenosis. Neurology recommended changing patient to aggrenox for thrombotic stroke due to SVD  Slept ok last noc, no BM today thus far  No pain  Review of Systems  Neurological: Positive for focal weakness.  All other systems reviewed and are negative.    Objective: Vital Signs: Blood pressure 119/80, pulse 94, temperature 97.5 F (36.4 C), temperature source Oral, resp. rate 18, weight 73.4 kg (161 lb 13.1 oz), SpO2 100.00%. No results found. Results for orders placed during the hospital encounter of 08/30/12 (from the past 72 hour(s))  GLUCOSE, CAPILLARY     Status: Abnormal   Collection Time    09/04/12 11:08 AM      Result Value Range   Glucose-Capillary 110 (*) 70 - 99 mg/dL  GLUCOSE, CAPILLARY     Status: None   Collection Time    09/04/12  4:19 PM      Result Value Range   Glucose-Capillary 90  70 - 99 mg/dL  GLUCOSE, CAPILLARY     Status: Abnormal   Collection Time    09/04/12  8:30 PM      Result Value Range   Glucose-Capillary 165 (*) 70 - 99 mg/dL   Comment 1 Notify RN     GLUCOSE, CAPILLARY     Status: Abnormal   Collection Time    09/05/12  7:35 AM      Result Value Range   Glucose-Capillary 112 (*) 70 - 99 mg/dL   Comment 1 Documented in Chart     Comment 2 Notify RN    GLUCOSE, CAPILLARY     Status: Abnormal   Collection Time    09/05/12 11:42 AM      Result Value Range   Glucose-Capillary 100 (*) 70 - 99 mg/dL   Comment 1 Documented in Chart     Comment 2 Notify RN    GLUCOSE, CAPILLARY     Status: Abnormal   Collection Time    09/05/12  4:54 PM      Result Value Range   Glucose-Capillary 121 (*) 70 - 99 mg/dL   Comment 1 Documented in Chart     Comment 2 Notify RN    GLUCOSE, CAPILLARY     Status: Abnormal   Collection Time    09/05/12  9:01 PM      Result Value Range   Glucose-Capillary 135 (*) 70 - 99 mg/dL   Comment 1 Notify RN    CREATININE, SERUM     Status: Abnormal   Collection Time    09/06/12  5:01 AM      Result Value Range   Creatinine, Ser 0.85  0.50 - 1.10 mg/dL  GFR calc non Af Amer 73 (*) >90 mL/min   GFR calc Af Amer 85 (*) >90 mL/min   Comment:            The eGFR has been calculated     using the CKD EPI equation.     This calculation has not been     validated in all clinical     situations.     eGFR's persistently     <90 mL/min signify     possible Chronic Kidney Disease.  GLUCOSE, CAPILLARY     Status: Abnormal   Collection Time    09/06/12  7:20 AM      Result Value Range   Glucose-Capillary 129 (*) 70 - 99 mg/dL   Comment 1 Notify RN    GLUCOSE, CAPILLARY     Status: Abnormal   Collection Time    09/06/12 11:08 AM      Result Value Range   Glucose-Capillary 117 (*) 70 - 99 mg/dL   Comment 1 Notify RN    GLUCOSE, CAPILLARY     Status: Abnormal   Collection Time    09/06/12  4:43 PM      Result Value Range   Glucose-Capillary 147 (*) 70 - 99 mg/dL   Comment 1 Notify RN    GLUCOSE, CAPILLARY     Status: Abnormal   Collection Time    09/06/12  8:34 PM      Result Value Range    Glucose-Capillary 117 (*) 70 - 99 mg/dL  GLUCOSE, CAPILLARY     Status: Abnormal   Collection Time    09/07/12  7:25 AM      Result Value Range   Glucose-Capillary 112 (*) 70 - 99 mg/dL   Comment 1 Notify RN        Nursing note and vitals reviewed.  Constitutional: She is oriented to person, place, and time. She appears well-developed and well-nourished.  HENT:  Head: Normocephalic and atraumatic.  Eyes: Pupils are equal, round, and reactive to light.  Neck: Normal range of motion. Neck supple.  Cardiovascular: Normal rate and regular rhythm.  Pulmonary/Chest: Effort normal and breath sounds normal. No respiratory distress. She has no wheezes.  Abdominal: Soft. Bowel sounds are normal.  Musculoskeletal: She exhibits no edema and no tenderness. Normal appearing arches, feet--non tender with ROM/palpation Neurological: She is alert and oriented to person, place, and time.  Soft spoken. Speech clear. Some delay in initiation but able to followcommands without difficulty. LUE weakness with decrease in fine motor control. RLE with minimal weakness.  Skin: Skin is warm and dry.  Psychiatric: Her speech is normal and behavior is normal. Thought content normal. Her mood appears a little flat. Cognition and memory are normal.  Motor strength: 4/5 in the right deltoid, biceps, triceps, grip, hip flexor, knee extensors, ankle dorsiflexor plantar flexor  4 minus/5 in the left deltoid, biceps, triceps, grip, hip flexor, knee extensors, ankle dorsiflexor and plantar flexor  Sensation intact to light touch and proprioception in bilateral hands and feet with the exception of left little finger. Decreased FT bilateral feet/plantar aspects Visual fields intact to confrontation testing  Cerebellar testing no evidence of ataxia on finger nose to finger and heel-to-shin testing are   Assessment/Plan: 1. Functional deficits secondary to acute nonhemorrhagic infarct at the junction of the right thalamus and  posterior limb of the right internal capsule  which require 3+ hours per day of interdisciplinary therapy in a comprehensive inpatient rehab setting. Physiatrist  is providing close team supervision and 24 hour management of active medical problems listed below. Physiatrist and rehab team continue to assess barriers to discharge/monitor patient progress toward functional and medical goals.  Pt slowly progressing overall min/min guard levels FIM: FIM - Bathing Bathing Steps Patient Completed: Chest;Right Arm;Abdomen;Front perineal area;Buttocks;Right upper leg;Left upper leg;Right lower leg (including foot);Left lower leg (including foot);Left Arm Bathing: 5: Supervision: Safety issues/verbal cues  FIM - Upper Body Dressing/Undressing Upper body dressing/undressing steps patient completed: Thread/unthread right sleeve of pullover shirt/dresss;Thread/unthread left sleeve of pullover shirt/dress;Put head through opening of pull over shirt/dress;Pull shirt over trunk;Thread/unthread right bra strap;Thread/unthread left bra strap;Hook/unhook bra Upper body dressing/undressing: 5: Set-up assist to: Obtain clothing/put away FIM - Lower Body Dressing/Undressing Lower body dressing/undressing steps patient completed: Thread/unthread right underwear leg;Thread/unthread left underwear leg;Pull underwear up/down;Thread/unthread right pants leg;Thread/unthread left pants leg;Pull pants up/down;Fasten/unfasten pants;Don/Doff right sock;Don/Doff left sock Lower body dressing/undressing: 5: Set-up assist to: Obtain clothing  FIM - Toileting Toileting steps completed by patient: Performs perineal hygiene;Adjust clothing prior to toileting;Adjust clothing after toileting Toileting Assistive Devices: Grab bar or rail for support Toileting: 5: Supervision: Safety issues/verbal cues  FIM - Diplomatic Services operational officer Devices: Grab bars Toilet Transfers: 4-To toilet/BSC: Min A (steadying Pt. >  75%);4-From toilet/BSC: Min A (steadying Pt. > 75%)  FIM - Bed/Chair Transfer Bed/Chair Transfer Assistive Devices: Arm rests;Walker Bed/Chair Transfer: 4: Bed > Chair or W/C: Min A (steadying Pt. > 75%);4: Chair or W/C > Bed: Min A (steadying Pt. > 75%)  FIM - Locomotion: Wheelchair Distance: 30 Locomotion: Wheelchair: 1: Total Assistance/staff pushes wheelchair (Pt<25%) FIM - Locomotion: Ambulation Locomotion: Ambulation Assistive Devices: Designer, industrial/product Ambulation/Gait Assistance: 5: Supervision Locomotion: Ambulation: 5: Travels 150 ft or more with supervision/safety issues  Comprehension Comprehension Mode: Auditory Comprehension: 5-Follows basic conversation/direction: With extra time/assistive device  Expression Expression Mode: Verbal Expression: 5-Expresses basic needs/ideas: With no assist  Social Interaction Social Interaction: 6-Interacts appropriately with others with medication or extra time (anti-anxiety, antidepressant).  Problem Solving Problem Solving: 5-Solves basic 90% of the time/requires cueing < 10% of the time  Memory Memory: 6-More than reasonable amt of time  Medical Problem List and Plan:  1. DVT Prophylaxis/Anticoagulation: Pharmaceutical: Lovenox  2. Pain Management: Will use tylenol prn for pain. Added voltaren gel for left wrist pain. --controlled at present 3. Mood: Flat affect with underlying anxiety noted. Will continue celexa and klonopin. Needs ego support. Will have LCSW follow up for evaluation.  4. Neuropsych: This patient is capable of making decisions on his/her own behalf.  5. DM type 2 with neuropathy: Monitor BS with AC/HS checks. Off metformin. Under control  -follow for increased pain---minor at present. 6. HTN: Will monitor with bid checks. on low dose ace for better control will close monitoring for hypotension. .  7. Dyslipidemia: LDL--128.   Lipitor  .      LOS (Days) 8 A FACE TO FACE EVALUATION WAS  PERFORMED  [name],ANDREW E 09/07/2012, 8:30 AM

## 2012-09-08 ENCOUNTER — Inpatient Hospital Stay (HOSPITAL_COMMUNITY): Payer: Medicare Other | Admitting: Speech Pathology

## 2012-09-08 ENCOUNTER — Inpatient Hospital Stay (HOSPITAL_COMMUNITY): Payer: Medicare Other

## 2012-09-08 ENCOUNTER — Inpatient Hospital Stay (HOSPITAL_COMMUNITY): Payer: Medicare Other | Admitting: *Deleted

## 2012-09-08 ENCOUNTER — Encounter (HOSPITAL_COMMUNITY): Payer: Medicare Other | Admitting: Occupational Therapy

## 2012-09-08 LAB — GLUCOSE, CAPILLARY: Glucose-Capillary: 110 mg/dL — ABNORMAL HIGH (ref 70–99)

## 2012-09-08 NOTE — Progress Notes (Signed)
Patient ID: Sophia Rodriguez, female   DOB: 08/20/1950, 62 y.o.   MRN: [id number] Subjective/Complaints: 62 y.o. 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 She was admitted on 08/26/12 with 24 hour history of difficulty walking, decrease in balance as well as increased right sided weakness. MRI of the brain showed acute nonhemorrhagic infarct at the junction of the right thalamus and posterior limb of the right internal capsule as well as remote moderate to large size infarct medial aspect left frontal and parietal lobes as well as left globus pallidus and remote left paracentral small infarct. MRA of the head with atherosclerotic type changes. MRA of the neck without significant stenosis. Echocardiogram with EF 60-65% and source of emboli. Carotid Dopplers with no ICA stenosis. Neurology recommended changing patient to aggrenox for thrombotic stroke due to SVD  Refused therapy yesterday, felt "basd" No c/o s today  No pain  Review of Systems  Neurological: Positive for focal weakness.  All other systems reviewed and are negative.    Objective: Vital Signs: Blood pressure 124/77, pulse 96, temperature 97.9 F (36.6 C), temperature source Oral, resp. rate 18, height 5\' 7"  (1.702 m), weight 73.4 kg (161 lb 13.1 oz), SpO2 100.00%. No results found. Results for orders placed during the hospital encounter of 08/30/12 (from the past 72 hour(s))  GLUCOSE, CAPILLARY     Status: Abnormal   Collection Time    09/05/12 11:42 AM      Result Value Range   Glucose-Capillary 100 (*) 70 - 99 mg/dL   Comment 1 Documented in Chart     Comment 2 Notify RN    GLUCOSE, CAPILLARY     Status: Abnormal   Collection Time    09/05/12  4:54 PM      Result Value Range   Glucose-Capillary 121 (*) 70 - 99 mg/dL   Comment 1 Documented in Chart     Comment 2 Notify RN    GLUCOSE,  CAPILLARY     Status: Abnormal   Collection Time    09/05/12  9:01 PM      Result Value Range   Glucose-Capillary 135 (*) 70 - 99 mg/dL   Comment 1 Notify RN    CREATININE, SERUM     Status: Abnormal   Collection Time    09/06/12  5:01 AM      Result Value Range   Creatinine, Ser 0.85  0.50 - 1.10 mg/dL   GFR calc non Af Amer 73 (*) >90 mL/min   GFR calc Af Amer 85 (*) >90 mL/min   Comment:            The eGFR has been calculated     using the CKD EPI equation.     This calculation has not been     validated in all clinical     situations.     eGFR's persistently     <90 mL/min signify     possible Chronic Kidney Disease.  GLUCOSE, CAPILLARY     Status: Abnormal   Collection Time    09/06/12  7:20 AM      Result Value Range   Glucose-Capillary 129 (*) 70 - 99 mg/dL   Comment 1 Notify RN    GLUCOSE, CAPILLARY     Status: Abnormal   Collection Time    09/06/12 11:08 AM      Result Value Range   Glucose-Capillary 117 (*)  70 - 99 mg/dL   Comment 1 Notify RN    GLUCOSE, CAPILLARY     Status: Abnormal   Collection Time    09/06/12  4:43 PM      Result Value Range   Glucose-Capillary 147 (*) 70 - 99 mg/dL   Comment 1 Notify RN    GLUCOSE, CAPILLARY     Status: Abnormal   Collection Time    09/06/12  8:34 PM      Result Value Range   Glucose-Capillary 117 (*) 70 - 99 mg/dL  GLUCOSE, CAPILLARY     Status: Abnormal   Collection Time    09/07/12  7:25 AM      Result Value Range   Glucose-Capillary 112 (*) 70 - 99 mg/dL   Comment 1 Notify RN    GLUCOSE, CAPILLARY     Status: Abnormal   Collection Time    09/07/12 11:28 AM      Result Value Range   Glucose-Capillary 197 (*) 70 - 99 mg/dL   Comment 1 Notify RN    BASIC METABOLIC PANEL     Status: Abnormal   Collection Time    09/07/12  4:06 PM      Result Value Range   Sodium 139  135 - 145 mEq/L   Potassium 3.9  3.5 - 5.1 mEq/L   Chloride 104  96 - 112 mEq/L   CO2 25  19 - 32 mEq/L   Glucose, Bld 123 (*) 70 - 99  mg/dL   BUN 27 (*) 6 - 23 mg/dL   Creatinine, Ser 7.82  0.50 - 1.10 mg/dL   Calcium 9.7  8.4 - 95.6 mg/dL   GFR calc non Af Amer 57 (*) >90 mL/min   GFR calc Af Amer 66 (*) >90 mL/min   Comment:            The eGFR has been calculated     using the CKD EPI equation.     This calculation has not been     validated in all clinical     situations.     eGFR's persistently     <90 mL/min signify     possible Chronic Kidney Disease.  GLUCOSE, CAPILLARY     Status: None   Collection Time    09/07/12  4:47 PM      Result Value Range   Glucose-Capillary 97  70 - 99 mg/dL   Comment 1 Notify RN    URINALYSIS, ROUTINE W REFLEX MICROSCOPIC     Status: Abnormal   Collection Time    09/07/12  8:00 PM      Result Value Range   Color, Urine YELLOW  YELLOW   APPearance CLOUDY (*) CLEAR   Specific Gravity, Urine 1.025  1.005 - 1.030   pH 5.0  5.0 - 8.0   Glucose, UA NEGATIVE  NEGATIVE mg/dL   Hgb urine dipstick NEGATIVE  NEGATIVE   Bilirubin Urine NEGATIVE  NEGATIVE   Ketones, ur NEGATIVE  NEGATIVE mg/dL   Protein, ur NEGATIVE  NEGATIVE mg/dL   Urobilinogen, UA 0.2  0.0 - 1.0 mg/dL   Nitrite NEGATIVE  NEGATIVE   Leukocytes, UA SMALL (*) NEGATIVE  URINE MICROSCOPIC-ADD ON     Status: Abnormal   Collection Time    09/07/12  8:00 PM      Result Value Range   Squamous Epithelial / LPF FEW (*) RARE   WBC, UA 3-6  <3 WBC/hpf   RBC / HPF 0-2  <  3 RBC/hpf   Bacteria, UA RARE  RARE  GLUCOSE, CAPILLARY     Status: Abnormal   Collection Time    09/07/12  8:42 PM      Result Value Range   Glucose-Capillary 158 (*) 70 - 99 mg/dL   Comment 1 Notify RN    GLUCOSE, CAPILLARY     Status: Abnormal   Collection Time    09/08/12  7:26 AM      Result Value Range   Glucose-Capillary 127 (*) 70 - 99 mg/dL   Comment 1 Notify RN        Nursing note and vitals reviewed.  Constitutional: She is oriented to person, place, and time. She appears well-developed and well-nourished.  HENT:  Head:  Normocephalic and atraumatic.  Eyes: Pupils are equal, round, and reactive to light.  Neck: Normal range of motion. Neck supple.  Cardiovascular: Normal rate and regular rhythm.  Pulmonary/Chest: Effort normal and breath sounds normal. No respiratory distress. She has no wheezes.  Abdominal: Soft. Bowel sounds are normal.  Musculoskeletal: She exhibits no edema and no tenderness. Normal appearing arches, feet--non tender with ROM/palpation Neurological: She is alert and oriented to person, place, and time.  Soft spoken. Speech clear. Some delay in initiation but able to followcommands without difficulty. LUE weakness with decrease in fine motor control. RLE with minimal weakness.  Skin: Skin is warm and dry.  Psychiatric: Her speech is normal and behavior is normal. Thought content normal. Her mood appears a little flat. Cognition and memory are normal.  Motor strength: 4/5 in the right deltoid, biceps, triceps, grip, hip flexor, knee extensors, ankle dorsiflexor plantar flexor  4 minus/5 in the left deltoid, biceps, triceps, grip, hip flexor, knee extensors, ankle dorsiflexor and plantar flexor  Sensation intact to light touch and proprioception in bilateral hands and feet with the exception of left little finger. Decreased FT bilateral feet/plantar aspects  Cerebellar testing no evidence of ataxia on finger nose to finger and heel-to-shin testing are   Assessment/Plan: 1. 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 Physiatrist is providing close team supervision and 24 hour management of active medical problems listed below. Physiatrist and rehab team continue to assess barriers to discharge/monitor patient progress toward functional and medical goals. Team conference today please see  physician documentation under team conference tab, met with team face-to-face to discuss problems,progress, and goals. Formulized individual treatment plan based on medical history, underlying problem and comorbidities. FIM: FIM - Bathing Bathing Steps Patient Completed: Chest;Right Arm;Abdomen;Front perineal area;Buttocks;Right upper leg;Left upper leg;Right lower leg (including foot);Left lower leg (including foot);Left Arm Bathing: 5: Supervision: Safety issues/verbal cues  FIM - Upper Body Dressing/Undressing Upper body dressing/undressing steps patient completed: Thread/unthread right sleeve of pullover shirt/dresss;Thread/unthread left sleeve of pullover shirt/dress;Put head through opening of pull over shirt/dress;Pull shirt over trunk;Thread/unthread right bra strap;Thread/unthread left bra strap;Hook/unhook bra Upper body dressing/undressing: 5: Set-up assist to: Obtain clothing/put away FIM - Lower Body Dressing/Undressing Lower body dressing/undressing steps patient completed: Thread/unthread right underwear leg;Thread/unthread left underwear leg;Pull underwear up/down;Thread/unthread right pants leg;Thread/unthread left pants leg;Pull pants up/down;Fasten/unfasten pants;Don/Doff right sock;Don/Doff left sock Lower body dressing/undressing: 5: Set-up assist to: Obtain clothing  FIM - Toileting Toileting steps completed by patient: Performs perineal hygiene;Adjust clothing prior to toileting;Adjust clothing after toileting Toileting Assistive Devices: Grab bar or rail  for support Toileting: 5: Supervision: Safety issues/verbal cues  FIM - Diplomatic Services operational officer Devices: Grab bars Toilet Transfers: 4-To toilet/BSC: Min A (steadying Pt. > 75%);4-From toilet/BSC: Min A (steadying Pt. > 75%)  FIM - Bed/Chair Transfer Bed/Chair Transfer Assistive Devices: Arm rests;Walker Bed/Chair Transfer: 0: Activity did not occur  FIM - Locomotion: Wheelchair Distance:  30 Locomotion: Wheelchair: 0: Activity did not occur FIM - Locomotion: Ambulation Locomotion: Ambulation Assistive Devices: Designer, industrial/product Ambulation/Gait Assistance: 5: Supervision Locomotion: Ambulation: 0: Activity did not occur  Comprehension Comprehension Mode: Auditory Comprehension: 5-Understands complex 90% of the time/Cues < 10% of the time  Expression Expression Mode: Verbal Expression: 5-Expresses basic needs/ideas: With no assist  Social Interaction Social Interaction: 6-Interacts appropriately with others with medication or extra time (anti-anxiety, antidepressant).  Problem Solving Problem Solving: 6-Solves complex problems: With extra time  Memory Memory: 6-More than reasonable amt of time  Medical Problem List and Plan:  1. DVT Prophylaxis/Anticoagulation: Pharmaceutical: Lovenox  2. Pain Management: Will use tylenol prn for pain. Added voltaren gel for left wrist pain. --controlled at present 3. Mood: Flat affect with underlying anxiety noted. Will continue celexa and klonopin. Needs ego support. Will have LCSW follow up for evaluation.  4. Neuropsych: This patient is capable of making decisions on his/her own behalf.  5. DM type 2 with neuropathy: Monitor BS with AC/HS checks. Off metformin. Under control  -follow for increased pain---minor at present. 6. HTN: Will monitor with bid checks. on low dose ace for better control will close monitoring for hypotension. .  7. Dyslipidemia: LDL--128.   Lipitor  .      LOS (Days) 9 A FACE TO FACE EVALUATION WAS PERFORMED  Darrin Koman E 09/08/2012, 8:27 AM

## 2012-09-08 NOTE — Progress Notes (Signed)
Occupational Therapy Note  Patient Details  Name: Sophia Rodriguez MRN: [id number] Date of Birth: [birth year] Today's Date: 09/08/2012  Time In:  10:32  Time Out:  11:12.  Pt missed 13 minutes stating she was done with her bathing and dressing and wanted to rest.  No complaints of pain.  Individual session.  Treatment focused on ADL's at shower level with emphasis on functional ambulation with RW in room (patient initially needed max cues to monitor stepping with right foot as she tends to ignore and "leave RLE behind"), dynamic standing balance, safety, activity tolerance, motor planning, toilet and shower transfers and discussion of discharge planning. Patient very motivated to work on showering and dressing today.    Norton Pastel 09/08/2012, 11:28 AM

## 2012-09-08 NOTE — Progress Notes (Signed)
Occupational Therapy Session Note  Patient Details  Name: Sophia Rodriguez MRN: [id number] Date of Birth: 07-27-1950  Today's Date: 09/08/2012 Time: 4098-1191 Time Calculation (min): 45 min  Short Term Goals: Week 1:  OT Short Term Goal 1 (Week 1): Pt will complete LB dressing with supervision for standing balance and no more then 3 verbal cue for sequencing/tech OT Short Term Goal 1 - Progress (Week 1): Met OT Short Term Goal 2 (Week 1): Pt will complete bathing task with supervision and no more then 3 verbal cue for sequencing OT Short Term Goal 2 - Progress (Week 1): Met OT Short Term Goal 3 (Week 1): Pt complete toileting transfer with min assist and 1 verbal cue for motor planning  OT Short Term Goal 3 - Progress (Week 1): Met OT Short Term Goal 4 (Week 1): Pt will complete shower transfer with min assist OT Short Term Goal 4 - Progress (Week 1): Met OT Short Term Goal 5 (Week 1): Pt willl sequence and initiate morning self-care tasks with no more then 2 verbal cues. OT Short Term Goal 5 - Progress (Week 1): Met STGs=LTGs  Skilled Therapeutic Interventions/Progress Updates:    Therapy session focused on functional transfers, motor planning during functional ambulation and home management tasks, dynamic standing balance, and activity tolerance. Pt completed toilet transfer and tub shower transfer at Mod I level with increased time. Pt ambulated to and from therapy gym with supervision for cues to bring R foot completely through during ambulation. Pt reported being able to tell a difference when she was "dragging foot." Engaged in home management task of making bed with supervision and good dynamic standing balance. Engaged in dynamic standing balance task of passing letter ball back and forth and naming restaurants that began with specific letters. Pt required 5 rest breaks total during therapy session. Pt with flat affect most of therapy session and smiled 3 times showing slight laughter.    Therapy Documentation Precautions:  Precautions Precautions: Fall Precaution Comments: 2-3 falls at home within last month per pt.  Restrictions Weight Bearing Restrictions: No General: General Amount of Missed OT Time (min): 13 Minutes Vital Signs: Therapy Vitals Temp: 98.2 F (36.8 C) Temp src: Oral Pulse Rate: 90 Resp: 18 BP: 115/80 mmHg Patient Position, if appropriate: Sitting Oxygen Therapy SpO2: 99 % O2 Device: None (Room air) Pain: No c/o pain during therapy session.   See FIM for current functional status  Therapy/Group: Individual Therapy  Daneil Dan 09/08/2012, 3:12 PM

## 2012-09-08 NOTE — Progress Notes (Signed)
Recreational Therapy Session Note  Patient Details  Name: Sophia Rodriguez MRN: [id number] Date of Birth: [birth year] Today's Date: 09/08/2012 Time:  900-925 Pain: no c/o Skilled Therapeutic Interventions/Progress Updates: Discussion with pt about community reintegration/outing to be scheduled for tomorrow during lunch including review of the purpose of outing & potential goals.  Pt agreeable to participate and stated understanding.    Arpan Eskelson 09/08/2012, 5:20 PM

## 2012-09-08 NOTE — Progress Notes (Signed)
Speech Language Pathology Daily Session Note  Patient Details  Name: Sophia Rodriguez MRN: [id number] Date of Birth: May 24, 1950  Today's Date: 09/08/2012 Time: 1000-1025 Time Calculation (min): 25 min  Short Term Goals: Week 1: SLP Short Term Goal 1 (Week 1): Patient will utilize word finding strategies durign conversation with Supervision level verbal cues. SLP Short Term Goal 2 (Week 1): Patient will utilize external aids to assist with recall of daily information with Supervision level verbal cues. SLP Short Term Goal 3 (Week 1): Patient will demonstrate moderately complex problem solving while completing familiar tasks with Min assist verbal cues.  Skilled Therapeutic Interventions: Skilled treatment session focused on addressing awareness and education during discharge planning.  SLP facilitated session with a hand out and discussion regarding upgraded goals to a Mod I level with need for intermittent supervision with complex tasks from daughter.  Patient verbalized understanding as well as 2/3 items that she will need assistance with from daughter.  Need for family education; continue with current plan of care.      FIM:  Comprehension Comprehension Mode: Auditory Comprehension: 5-Understands complex 90% of the time/Cues < 10% of the time Expression Expression Mode: Verbal Expression: 6-Expresses complex ideas: With extra time/assistive device Social Interaction Social Interaction: 6-Interacts appropriately with others with medication or extra time (anti-anxiety, antidepressant). Problem Solving Problem Solving: 6-Solves complex problems: With extra time Memory Memory: 6-More than reasonable amt of time FIM - Eating Eating Activity: 0: Activity did not occur  Pain Pain Assessment Pain Assessment: No/denies pain Pain Score: 0-No pain  Therapy/Group: Individual Therapy  Charlane Ferretti., CCC-SLP [phone]  Sophia Rodriguez 09/08/2012, 12:38 PM

## 2012-09-08 NOTE — Progress Notes (Signed)
Orthopedic Tech Progress Note Patient Details:  Sophia Rodriguez [birth year] [id number]  Patient ID: Sophia Rodriguez, female   DOB: Jun 09, 1950, 62 y.o.   MRN: [id number]   [name] 09/08/2012, 11:28 AM Called [facility] for left AFO brace.

## 2012-09-08 NOTE — Progress Notes (Signed)
Physical Therapy Session Note  Patient Details  Name: Sophia Rodriguez MRN: [id number] Date of Birth: 1950-09-16  Today's Date: 09/08/2012 Time: 0830-0930 Time Calculation (min): 60 min  Short Term Goals: Week 2:  PT Short Term Goal 1 (Week 2): STGs=LTGs (upgraded to mod I overall on 09/06/12)  Skilled Therapeutic Interventions/Progress Updates:    Patient received sitting in wheelchair. Assisted patient with doffing gown and donning 2 new gowns. Patient requires supervision when standing to doff/don. Discussion about use of AFO (unable to trial secondary to only a large R toe off AFO available), appropriate footwear, and whether patient's daughter can bring in different shoes. Discussion also about discharge date and patient's daughter's availability to pick her up and also recommendations for S for car transfers, stairs (in community; no stairs in home), and community wheelchair mobility.  Session focused on gait training with R DF ace wrap in various environments and on various surfaces, stair negotiation, and transfers from low, cushioned surfaces. See details below for gait training/stair negotiation. Patient performed transfers on/off low positioned, cushioned sofa in family room with supervision (verbal cues for scooting to edge of sofa and performing anterior weight shift). Patient returned to room and left seated in wheelchair with all needs within reach.   Therapy Documentation Precautions:  Precautions Precautions: Fall Precaution Comments: 2-3 falls at home within last month per pt.  Restrictions Weight Bearing Restrictions: No Pain: Pain Assessment Pain Assessment: No/denies pain Pain Score: 0-No pain Locomotion : Ambulation Ambulation: Yes Ambulation/Gait Assistance: 5: Supervision Ambulation Distance (Feet): 180 Feet; 25' x2 (home environment) Assistive device: Rolling walker Ambulation/Gait Assistance Details: Verbal cues for gait pattern Ambulation/Gait Assistance  Details: R DF ace wrap donned for gait training secondary to only large R toe off AFO available. Patient instructed in gait training 180' x1 with RW and supervision in controlled environment and in home environment 25' x2 on carpet and in family room. Patient demonstrates improved R foot clearance with gait with DF wrap. Gait Gait: Yes Gait Pattern: Decreased step length - right;Decreased weight shift to right;Decreased dorsiflexion - right;Shuffle;Decreased hip/knee flexion - right;Decreased hip/knee flexion - left;Decreased stance time - left;Step-through pattern Stairs / Additional Locomotion Stairs: Yes Stairs Assistance: 5: Supervision Stairs Assistance Details: Verbal cues for sequencing;Tactile cues for initiation;Verbal cues for technique;Verbal cues for precautions/safety Stair Management Technique: One rail Left;Step to pattern;Forwards Number of Stairs: 5 Height of Stairs: 6 Corporate treasurer: Yes Wheelchair Assistance: 5: Financial planner Details: Verbal cues for sequencing;Verbal cues for Engineer, drilling: Both lower extermities Wheelchair Parts Management: Needs assistance Distance: 125   See FIM for current functional status  Therapy/Group: Individual Therapy and Co-Treatment (second 30 min co-treat with Rec Therapy)  Sophia Rodriguez, PT, DPT 09/08/2012, 10:37 AM

## 2012-09-08 NOTE — Patient Care Conference (Signed)
Inpatient RehabilitationTeam Conference and Plan of Care Update Date: 09/08/2012   Time: 10:40 Am    Patient Name: Sophia Rodriguez      Medical Record Number: [id number]  Date of Birth: 06-20-1950 Sex: Female         Room/Bed: 4007/4007-01 Payor Info: Payor: MEDICARE / Plan: MEDICARE PART A AND B / Product Type: *No Product type* /    Admitting Diagnosis: RT CVA  Admit Date/Time:  08/30/2012  3:04 PM Admission Comments: No comment available   Primary Diagnosis:  Stroke Principal Problem: Stroke  Patient Active Problem List   Diagnosis Date Noted  . 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 Stroke   . Hypertension     Expected Discharge Date: Expected Discharge Date: 09/10/12  Team Members Present: Physician leading conference: Dr. Claudette Laws Social Worker Present: Dossie Der, LCSW Nurse Present: Other (comment) Efraim Kaufmann Ramgert-RN) PT Present: Edman Circle, PT;Caroline Adriana Simas, PT;Other (comment) Clarisse Gouge Ripa-PT) OT Present: Other (comment);Leonette Monarch, Felipa Eth, OT (Kayla Perkinson-OT) SLP Present: Fae Pippin, SLP Other (Discipline and Name): Charolette Child     Current Status/Progress Goal Weekly Team Focus  Medical   refused therapy yesterday, no specific c/os  full PT/OT participation  monitor for depression , consider neuropsych   Bowel/Bladder   Pt continent of B&B   will remain continent of B&B  will remain continent of B&B   Swallow/Nutrition/ Hydration     na        ADL's   supervision with functional transfers and self-care tasks; increased time during functional transfers secondary to motor planning  Mod I overall  higher level IADLs, dynamic standing balance, pt education, functional transfers, NMR   Mobility   S overall  LTGs uprgraded to mod I overall, except car transfer, community ambulation, and community w/c mobility maintained at S  safety, balance, gait, stairs, balance, activity tolerance, initiation, motor planning, bed  mobility, strengthening   Communication   Mod I   Mod I upgraded   family education    Safety/Cognition/ Behavioral Observations  Mod I   Mod I with intermittent checking with complex  family education    Pain   denies pain  pain < 3  monitor and assess pain Qshift and prn   Skin   skin free from infection/breakdown  skin to remain free of infection/breakdown  monitor skin Qshift and prn      *See Care Plan and progress notes for long and short-term goals.  Barriers to Discharge: prior CVA causing R HP, cognitive deficits    Possible Resolutions to Barriers:  Consider caregiver post D/C    Discharge Planning/Teaching Needs:  Home alone with daughter checking daily on-reports feeling better today and will participate in therapies.      Team Discussion:  Feeling better today-participating in therapies.  BP issues yesterday. Increase use of compensatory strategies.  Reaching discharge goals.  Revisions to Treatment Plan:  Upgraded goals to mod/i level   Continued Need for Acute Rehabilitation Level of Care: The patient requires daily medical management by a physician with specialized training in physical medicine and rehabilitation for the following conditions: Daily direction of a multidisciplinary physical rehabilitation program to ensure safe treatment while eliciting the highest outcome that is of practical value to the patient.: Yes Daily medical management of patient stability for increased activity during participation in an intensive rehabilitation regime.: Yes Daily analysis of laboratory values and/or radiology reports with any subsequent need for medication adjustment of medical intervention for :  Neurological problems  Lucy Chris 09/08/2012, 3:03 PM

## 2012-09-09 ENCOUNTER — Encounter: Payer: Medicare Other | Admitting: Occupational Therapy

## 2012-09-09 ENCOUNTER — Inpatient Hospital Stay (HOSPITAL_COMMUNITY): Payer: Medicare Other | Admitting: *Deleted

## 2012-09-09 ENCOUNTER — Inpatient Hospital Stay (HOSPITAL_COMMUNITY): Payer: Medicare Other | Admitting: Speech Pathology

## 2012-09-09 ENCOUNTER — Inpatient Hospital Stay (HOSPITAL_COMMUNITY): Payer: Medicare Other

## 2012-09-09 DIAGNOSIS — G8110 Spastic hemiplegia affecting unspecified side: Secondary | ICD-10-CM

## 2012-09-09 DIAGNOSIS — I6330 Cerebral infarction due to thrombosis of unspecified cerebral artery: Secondary | ICD-10-CM

## 2012-09-09 LAB — GLUCOSE, CAPILLARY
Glucose-Capillary: 121 mg/dL — ABNORMAL HIGH (ref 70–99)
Glucose-Capillary: 124 mg/dL — ABNORMAL HIGH (ref 70–99)

## 2012-09-09 LAB — URINE CULTURE

## 2012-09-09 NOTE — Progress Notes (Signed)
Physical Therapy Discharge Summary  Patient Details  Name: Sophia Rodriguez MRN: [id number] Date of Birth: 10-May-1950  Today's Date: 09/09/2012 Time: 1030-1100 Time Calculation (min): 30 min  Patient has met 12 of 12 long term goals due to improved activity tolerance, improved balance, improved postural control, ability to compensate for deficits, improved attention, improved awareness and improved coordination.  Patient to discharge at an ambulatory level Modified Independent in the home, supervision level in the community. Recommend use of wheelchair in community for longer distance.   Patient's care partner is independent to provide the necessary supervision at community level at discharge.  Reasons goals not met: N/A, all LTGs met.  Recommendation:  Patient will benefit from ongoing skilled PT services in outpatient setting to continue to advance safe functional mobility, address ongoing impairments in strength, balance, activity tolerance, coordination, gait impairments, and minimize fall risk.  Equipment: R toe off AFO  Reasons for discharge: treatment goals met and discharge from hospital  Patient/family agrees with progress made and goals achieved: Yes  Skilled Interventions: Patient completed car transfer with RW and supervision (for management of RW before and after transfer). Orthotist present during session to assess patient's gait. Agrees with recommendation of R toe off AFO to assist with R foot clearance during gait and mobility. Discussion with patient about appropriate footwear with use of AFO. Orthotist reports AFO will be delivered later this PM and if modifications are necessary, patient will receive follow up PT in [facility] and orthotist can follow up then. Discussion/education with patient about falls risk at home and indications vs. Contraindications for calling EMS or attempting floor transfer s/p fall at home. Patient able to repeat back contraindications  to attempting floor transfer and completes floor transfer with supervision/verbal cues for proper sequencing.   Patient made mod I in room. RN aware.  PT Discharge Precautions/Restrictions Precautions Precautions: None Restrictions Weight Bearing Restrictions: No Pain Pain Assessment Pain Assessment: No/denies pain Pain Score: 0-No pain Vision/Perception  Vision - History Baseline Vision: Wears glasses for distance only Patient Visual Report: No change from baseline Cognition Overall Cognitive Status: Within Functional Limits for tasks assessed Arousal/Alertness: Awake/alert Orientation Level: Oriented X4 Attention: Selective Sustained Attention: Appears intact Selective Attention: Appears intact Memory: Appears intact Awareness: Appears intact Problem Solving: Appears intact Safety/Judgment: Appears intact Sensation Sensation Light Touch: Appears Intact Hot/Cold: Appears Intact Proprioception: Appears Intact Additional Comments: Proprioception intact at B great toes and B ankles Coordination Gross Motor Movements are Fluid and Coordinated: Yes Fine Motor Movements are Fluid and Coordinated: Yes Coordination and Movement Description: Decreased speed and accuracy with rapid alternating movements with L UE and L LE Finger Nose Finger Test: improved accuracy with LUE Heel Shin Test: Improved accuracy with L LE Motor  Motor Motor: Within Functional Limits Motor - Skilled Clinical Observations: No clonus noted B LE  Mobility Bed Mobility Bed Mobility: Supine to Sit;Sit to Supine Supine to Sit: 6: Modified independent (Device/Increase time);HOB flat Sit to Supine: 6: Modified independent (Device/Increase time);HOB flat Transfers Sit to Stand: 6: Modified independent (Device/Increase time);With upper extremity assist;With armrests;From bed;From chair/3-in-1 Stand to Sit: 6: Modified independent (Device/Increase time);With armrests;With upper extremity assist;To bed;To  chair/3-in-1 Stand Pivot Transfers: 6: Modified independent (Device/Increase time);With armrests Locomotion  Ambulation Ambulation: Yes Ambulation/Gait Assistance: 6: Modified independent (Device/Increase time) Ambulation Distance (Feet): 200 Feet Assistive device: Rolling walker Ambulation/Gait Assistance Details: Patient instructed in gait training 200' x1, 160' x1 with RW and mod I in controlled and home environments (carpet, negotiation  of obstacles, etc.) Gait Gait: Yes Gait Pattern: Decreased step length - right;Decreased weight shift to right;Decreased dorsiflexion - right;Shuffle;Decreased hip/knee flexion - right;Decreased hip/knee flexion - left;Decreased stance time - left;Step-through pattern Stairs / Additional Locomotion Stairs: Yes Stairs Assistance: 5: Supervision Stairs Assistance Details: Verbal cues for sequencing;Verbal cues for precautions/safety Stair Management Technique: One rail Left;Step to pattern;Forwards Number of Stairs: 5 Height of Stairs: 6 Curb: 5: Supervision (with RW) Corporate treasurer: Yes Wheelchair Assistance: 5: Financial planner Details: Verbal cues for sequencing;Verbal cues for Engineer, drilling: Both lower extermities Wheelchair Parts Management: Needs assistance Distance: 150  Trunk/Postural Assessment  Cervical Assessment Cervical Assessment: Within Functional Limits Thoracic Assessment Thoracic Assessment: Within Functional Limits Lumbar Assessment Lumbar Assessment: Within Functional Limits Postural Control Postural Control: Within Functional Limits  Balance Static Standing Balance Static Standing - Balance Support: Bilateral upper extremity supported;During functional activity Static Standing - Level of Assistance: 6: Modified independent (Device/Increase time) Dynamic Standing Balance Dynamic Standing - Balance Support: Bilateral upper extremity supported;During  functional activity Dynamic Standing - Level of Assistance: 6: Modified independent (Device/Increase time) Dynamic Standing - Balance Activities: Lateral lean/weight shifting;Forward lean/weight shifting;Reaching for objects Extremity Assessment  RLE Assessment RLE Assessment: Within Functional Limits LLE Assessment LLE Assessment: Exceptions to [facility] LLE Strength LLE Overall Strength: Deficits LLE Overall Strength Comments: Grossly 3+ to 4-/5  See FIM for current functional status  Marcello Tuzzolino S Sophia Rodriguez, PT, DPT  09/09/2012, 2:12 PM

## 2012-09-09 NOTE — Progress Notes (Signed)
Recreational Therapy Session Note  Patient Details  Name: Sophia Rodriguez MRN: [id number] Date of Birth: Feb 25, 1951 Today's Date: 09/09/2012 Time:  1100-1300 Pain:no c/o Skilled Therapeutic Interventions/Progress Updates: Pt participated in community reintegration/outing for lunch with focus on safe community mobility on multiple surface types, identification & negotiation of obstacles, energy conservation techniques, & accessing public restroom.  Pt participated at ambulatory level using RW with supervision with exception of climbing van steps at [place] assist level.  See outing goal sheet in shadow chart for more details.  Therapy/Group: [employer]  [place] 09/09/2012, 9:30 AM

## 2012-09-09 NOTE — Progress Notes (Signed)
Note reviewed and accurately reflects treatment session.   

## 2012-09-09 NOTE — Progress Notes (Signed)
Patient ID: Sophia Rodriguez, female   DOB: January 08, 1951, 62 y.o.   MRN: [id number] Subjective/Complaints: 62 y.o. 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 She was admitted on 08/26/12 with 24 hour history of difficulty walking, decrease in balance as well as increased right sided weakness. MRI of the brain showed acute nonhemorrhagic infarct at the junction of the right thalamus and posterior limb of the right internal capsule as well as remote moderate to large size infarct medial aspect left frontal and parietal lobes as well as left globus pallidus and remote left paracentral small infarct. MRA of the head with atherosclerotic type changes. MRA of the neck without significant stenosis. Echocardiogram with EF 60-65% and source of emboli. Carotid Dopplers with no ICA stenosis. 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  All other systems reviewed and are negative.    Objective: Vital Signs: Blood pressure 117/75, pulse 91, temperature 98 F (36.7 C), temperature source Oral, resp. rate 19, height 5\' 7"  (1.702 m), weight 74.98 kg (165 lb 4.8 oz), SpO2 100.00%. No results found. Results for orders placed during the hospital encounter of 08/30/12 (from the past 72 hour(s))  GLUCOSE, CAPILLARY     Status: Abnormal   Collection Time    09/06/12 11:08 AM      Result Value Range   Glucose-Capillary 117 (*) 70 - 99 mg/dL   Comment 1 Notify RN    GLUCOSE, CAPILLARY     Status: Abnormal   Collection Time    09/06/12  4:43 PM      Result Value Range   Glucose-Capillary 147 (*) 70 - 99 mg/dL   Comment 1 Notify RN    GLUCOSE, CAPILLARY     Status: Abnormal   Collection Time    09/06/12  8:34 PM       Result Value Range   Glucose-Capillary 117 (*) 70 - 99 mg/dL  GLUCOSE, CAPILLARY     Status: Abnormal   Collection Time    09/07/12  7:25 AM      Result Value Range   Glucose-Capillary 112 (*) 70 - 99 mg/dL   Comment 1 Notify RN    GLUCOSE, CAPILLARY     Status: Abnormal   Collection Time    09/07/12 11:28 AM      Result Value Range   Glucose-Capillary 197 (*) 70 - 99 mg/dL   Comment 1 Notify RN    BASIC METABOLIC PANEL     Status: Abnormal   Collection Time    09/07/12  4:06 PM      Result Value Range   Sodium 139  135 - 145 mEq/L   Potassium 3.9  3.5 - 5.1 mEq/L   Chloride 104  96 - 112 mEq/L   CO2 25  19 - 32 mEq/L   Glucose, Bld 123 (*) 70 - 99 mg/dL   BUN 27 (*) 6 - 23 mg/dL   Creatinine, Ser 4.09  0.50 - 1.10 mg/dL   Calcium 9.7  8.4 - 81.1 mg/dL   GFR calc non Af Amer 57 (*) >90 mL/min   GFR calc Af Amer 66 (*) >90 mL/min   Comment:            The eGFR has  been calculated     using the CKD EPI equation.     This calculation has not been     validated in all clinical     situations.     eGFR's persistently     <90 mL/min signify     possible Chronic Kidney Disease.  GLUCOSE, CAPILLARY     Status: None   Collection Time    09/07/12  4:47 PM      Result Value Range   Glucose-Capillary 97  70 - 99 mg/dL   Comment 1 Notify RN    URINE CULTURE     Status: None   Collection Time    09/07/12  8:00 PM      Result Value Range   Specimen Description URINE, CLEAN CATCH     Special Requests NONE     Culture  Setup Time 09/07/2012 20:20     Colony Count 70,000 COLONIES/ML     Culture       Value: Multiple bacterial morphotypes present, none predominant. Suggest appropriate recollection if clinically indicated.   Report Status 09/09/2012 FINAL    URINALYSIS, ROUTINE W REFLEX MICROSCOPIC     Status: Abnormal   Collection Time    09/07/12  8:00 PM      Result Value Range   Color, Urine YELLOW  YELLOW   APPearance CLOUDY (*) CLEAR   Specific Gravity, Urine 1.025   1.005 - 1.030   pH 5.0  5.0 - 8.0   Glucose, UA NEGATIVE  NEGATIVE mg/dL   Hgb urine dipstick NEGATIVE  NEGATIVE   Bilirubin Urine NEGATIVE  NEGATIVE   Ketones, ur NEGATIVE  NEGATIVE mg/dL   Protein, ur NEGATIVE  NEGATIVE mg/dL   Urobilinogen, UA 0.2  0.0 - 1.0 mg/dL   Nitrite NEGATIVE  NEGATIVE   Leukocytes, UA SMALL (*) NEGATIVE  URINE MICROSCOPIC-ADD ON     Status: Abnormal   Collection Time    09/07/12  8:00 PM      Result Value Range   Squamous Epithelial / LPF FEW (*) RARE   WBC, UA 3-6  <3 WBC/hpf   RBC / HPF 0-2  <3 RBC/hpf   Bacteria, UA RARE  RARE  GLUCOSE, CAPILLARY     Status: Abnormal   Collection Time    09/07/12  8:42 PM      Result Value Range   Glucose-Capillary 158 (*) 70 - 99 mg/dL   Comment 1 Notify RN    GLUCOSE, CAPILLARY     Status: Abnormal   Collection Time    09/08/12  7:26 AM      Result Value Range   Glucose-Capillary 127 (*) 70 - 99 mg/dL   Comment 1 Notify RN    GLUCOSE, CAPILLARY     Status: Abnormal   Collection Time    09/08/12 11:30 AM      Result Value Range   Glucose-Capillary 110 (*) 70 - 99 mg/dL   Comment 1 Notify RN    GLUCOSE, CAPILLARY     Status: Abnormal   Collection Time    09/08/12  4:48 PM      Result Value Range   Glucose-Capillary 118 (*) 70 - 99 mg/dL   Comment 1 Notify RN    GLUCOSE, CAPILLARY     Status: Abnormal   Collection Time    09/08/12  8:35 PM      Result Value Range   Glucose-Capillary 137 (*) 70 - 99 mg/dL  GLUCOSE, CAPILLARY  Status: Abnormal   Collection Time    09/09/12  7:25 AM      Result Value Range   Glucose-Capillary 121 (*) 70 - 99 mg/dL   Comment 1 Notify RN        Nursing note and vitals reviewed.  Constitutional: She is oriented to person, place, and time. She appears well-developed and well-nourished.  HENT:  Head: Normocephalic and atraumatic.  Eyes: Pupils are equal, round, and reactive to light.  Neck: Normal range of motion. Neck supple.  Cardiovascular: Normal rate and  regular rhythm.  Pulmonary/Chest: Effort normal and breath sounds normal. No respiratory distress. She has no wheezes.  Abdominal: Soft. Bowel sounds are normal.  Musculoskeletal: She exhibits no edema and no tenderness. Normal appearing arches, feet--non tender with ROM/palpation Neurological: She is alert and oriented to person, place, and time.  Soft spoken. Speech clear. Some delay in initiation but able to followcommands without difficulty. LUE weakness with decrease in fine motor control. RLE with minimal weakness.  Skin: Skin is warm and dry.  Psychiatric: Her speech is normal and behavior is normal. Thought content normal. Her mood appears a little flat. Cognition and memory are normal.  Motor strength: 4/5 in the right deltoid, biceps, triceps, grip, hip flexor, knee extensors, ankle dorsiflexor plantar flexor  4 minus/5 in the left deltoid, biceps, triceps, grip, hip flexor, knee extensors, ankle dorsiflexor and plantar flexor    Cerebellar testing no evidence of ataxia on finger nose to finger and heel-to-shin testing are   Assessment/Plan: 1. 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 Should be ready for D/C in am  FIM - Bathing Bathing Steps Patient Completed: Chest;Right Arm;Left Arm;Abdomen;Left upper leg;Right upper leg;Buttocks;Front perineal area;Right lower leg (including foot);Left lower leg (including foot) Bathing: 6: Assistive device (Comment) (grab bar - only needed assistance for transfer)  FIM - Upper Body Dressing/Undressing Upper body dressing/undressing steps patient completed: Thread/unthread right bra strap;Thread/unthread left bra strap;Hook/unhook bra;Thread/unthread right sleeve of front closure shirt/dress;Thread/unthread left sleeve of front closure  shirt/dress;Pull shirt around back of front closure shirt/dress;Button/unbutton shirt Upper body dressing/undressing: 5: Set-up assist to: Obtain clothing/put away FIM - Lower Body Dressing/Undressing Lower body dressing/undressing steps patient completed: Thread/unthread right underwear leg;Thread/unthread left underwear leg;Pull underwear up/down;Thread/unthread right pants leg;Thread/unthread left pants leg;Pull pants up/down;Fasten/unfasten pants;Don/Doff right sock;Don/Doff left sock;Don/Doff right shoe;Don/Doff left shoe;Fasten/unfasten right shoe;Fasten/unfasten left shoe Lower body dressing/undressing: 5: Set-up assist to: Obtain clothing  FIM - Toileting Toileting steps completed by patient: Adjust clothing prior to toileting;Performs perineal hygiene;Adjust clothing after toileting Toileting Assistive Devices: Grab bar or rail for support Toileting: 6: More than reasonable amount of time  FIM - Diplomatic Services operational officer Devices: Grab bars;Walker Toilet Transfers: 6-To toilet/ BSC;6-From toilet/BSC  FIM - Banker Devices: Arm rests;Walker Bed/Chair Transfer: 6: Sit > Supine: No assist;6: Supine > Sit: No assist;5: Chair or W/C > Bed: Supervision (verbal cues/safety issues);5: Bed > Chair or W/C: Supervision (verbal cues/safety issues)  FIM - Locomotion: Wheelchair Distance: 125 Locomotion: Wheelchair: 2: Travels 50 - 149 ft with supervision, cueing or coaxing FIM - Locomotion: Ambulation Locomotion: Ambulation Assistive Devices: Designer, industrial/product Ambulation/Gait Assistance: 5: Supervision Locomotion: Ambulation: 5: Travels 150 ft or more with supervision/safety issues  Comprehension Comprehension Mode: Auditory Comprehension: 5-Understands complex 90% of the time/Cues <  10% of the time  Expression Expression Mode: Verbal Expression: 6-Expresses complex ideas: With extra time/assistive device  Social  Interaction Social Interaction: 6-Interacts appropriately with others with medication or extra time (anti-anxiety, antidepressant).  Problem Solving Problem Solving: 6-Solves complex problems: With extra time  Memory Memory: 6-More than reasonable amt of time  Medical Problem List and Plan:  1. DVT Prophylaxis/Anticoagulation: Pharmaceutical: Lovenox  2. Pain Management: Will use tylenol prn for pain. Added voltaren gel for left wrist pain. --controlled at present 3. Mood: Flat affect with underlying anxiety noted. Will continue celexa and klonopin. Needs ego support. Will have LCSW follow up for evaluation.  4. Neuropsych: This patient is capable of making decisions on his/her own behalf.  5. DM type 2 with neuropathy: Monitor BS with AC/HS checks. Off metformin. Under control  -follow for increased pain---minor at present. 6. HTN: Will monitor with bid checks. on low dose ace for better control will close monitoring for hypotension. .  7. Dyslipidemia: LDL--128.   Lipitor  .      LOS (Days) 10 A FACE TO FACE EVALUATION WAS PERFORMED  [name],ANDREW E 09/09/2012, 8:17 AM

## 2012-09-09 NOTE — Progress Notes (Signed)
Recreational Therapy Discharge Summary Patient Details  Name: Sophia Rodriguez MRN: [id number] Date of Birth: 08-Feb-1951 Today's Date: 09/09/2012  Long term goals set: 2  Long term goals met: 2  Comments on progress toward goals: Pt has made excellent progress toward goals and is ready for discharge home tomorrow at Mod I level and supervision level for community pursuits.  Pt does require extra time and use of AE to complete tasks safely.  Reasons for discharge: discharge from hospital  Patient/family agrees with progress made and goals achieved:yes  Tomasa Dobransky 09/09/2012, 9:31 AM

## 2012-09-09 NOTE — Progress Notes (Signed)
Orthopedic Tech Progress Note Patient Details:  Sophia Rodriguez 10/11/1950 [id number] Called in brace order for right afo. Patient ID: Sophia Rodriguez, female   DOB: [birth year], 62 y.o.   MRN: [id number]   Sophia Rodriguez 09/09/2012, 3:17 PM

## 2012-09-09 NOTE — Progress Notes (Signed)
Orthopedic Tech Progress Note Patient Details:  Larysa Pall Feb 23, 1951 [id number] Brace order completed by [employer] [facility]. Patient ID: Moniqua Engebretsen, female   DOB: [birth year], 62 y.o.   MRN: [id number]   Jennye Moccasin 09/09/2012, 6:17 PM

## 2012-09-09 NOTE — Social Work (Signed)
Lucy Chris, LCSW Social Worker Signed  Patient Care Conference Service date: 09/08/2012 3:03 PM  Inpatient RehabilitationTeam Conference and Plan of Care Update Date: 09/08/2012   Time: 10:40 Am     Patient Name: Sophia Rodriguez       Medical Record Number: [id number]   Date of Birth: 10/24/1950 Sex: Female         Room/Bed: 4007/4007-01 Payor Info: Payor: MEDICARE / Plan: MEDICARE PART A AND B / Product Type: *No Product type* /   Admitting Diagnosis: RT CVA   Admit Date/Time:  08/30/2012  3:04 PM Admission Comments: No comment available   Primary Diagnosis:  Stroke Principal Problem: Stroke    Patient Active Problem List     Diagnosis  Date Noted   .  DM type 2 causing neurological disease  08/26/2012   .  Stroke     .  Hypertension       Expected Discharge Date: Expected Discharge Date: 09/10/12  Team Members Present: Physician leading conference: Dr. Claudette Laws Social Worker Present: Dossie Der, LCSW Nurse Present: Other (comment) Efraim Kaufmann Ramgert-RN) PT Present: Edman Circle, PT;Caroline Adriana Simas, PT;Other (comment) Clarisse Gouge Ripa-PT) OT Present: Other (comment);Leonette Monarch, Felipa Eth, OT (Kayla Perkinson-OT) SLP Present: Fae Pippin, SLP Other (Discipline and Name): Charolette Child        Current Status/Progress  Goal  Weekly Team Focus   Medical     refused therapy yesterday, no specific c/os  full PT/OT participation  monitor for depression , consider neuropsych   Bowel/Bladder     Pt continent of B&B  will remain continent of B&B  will remain continent of B&B   Swallow/Nutrition/ Hydration     na       ADL's     supervision with functional transfers and self-care tasks; increased time during functional transfers secondary to motor planning  Mod I overall  higher level IADLs, dynamic standing balance, pt education, functional transfers, NMR   Mobility     S overall  LTGs uprgraded to mod I overall, except car transfer, community ambulation, and  community w/c mobility maintained at S  safety, balance, gait, stairs, balance, activity tolerance, initiation, motor planning, bed mobility, strengthening   Communication     Mod I   Mod I upgraded   family education    Safety/Cognition/ Behavioral Observations    Mod I   Mod I with intermittent checking with complex  family education    Pain     denies pain  pain < 3  monitor and assess pain Qshift and prn   Skin     skin free from infection/breakdown  skin to remain free of infection/breakdown  monitor skin Qshift and prn     *See Care Plan and progress notes for long and short-term goals.    Barriers to Discharge:  prior CVA causing R HP, cognitive deficits      Possible Resolutions to Barriers:    Consider caregiver post D/C      Discharge Planning/Teaching Needs:    Home alone with daughter checking daily on-reports feeling better today and will participate in therapies.      Team Discussion:    Feeling better today-participating in therapies.  BP issues yesterday. Increase use of compensatory strategies.  Reaching discharge goals.   Revisions to Treatment Plan:    Upgraded goals to mod/i level    Continued Need for Acute Rehabilitation Level of Care: The patient requires daily medical management by a physician with specialized training  in physical medicine and rehabilitation for the following conditions: Daily direction of a multidisciplinary physical rehabilitation program to ensure safe treatment while eliciting the highest outcome that is of practical value to the patient.: Yes Daily medical management of patient stability for increased activity during participation in an intensive rehabilitation regime.: Yes Daily analysis of laboratory values and/or radiology reports with any subsequent need for medication adjustment of medical intervention for : Neurological problems  Riot Barrick, [name] 09/08/2012, 3:03 PM

## 2012-09-09 NOTE — Discharge Summary (Signed)
Physician Discharge Summary  Patient ID: Lauriana Denes MRN: [id number] DOB/AGE: Mar 17, 1951 62 y.o.  Admit date: 08/30/2012 Discharge date: 09/10/2012  Discharge Diagnoses:  Principal Problem:   Stroke Active Problems:   Hypertension   DM type 2 causing neurological disease   Discharged Condition: Good.   Labs:  Basic Metabolic Panel:  Recent Labs Lab 09/06/12 0501 09/07/12 1606  NA  --  139  K  --  3.9  CL  --  104  CO2  --  25  GLUCOSE  --  123*  BUN  --  27*  CREATININE 0.85 1.04  CALCIUM  --  9.7    CBC: No results found for this basename: WBC, NEUTROABS, HGB, HCT, MCV, PLT,  in the last 168 hours  CBG:  Recent Labs Lab 09/09/12 1048 09/09/12 1702 09/09/12 2115 09/10/12 0727 09/10/12 1126  GLUCAP 124* 90 158* 114* 155*    Brief HPI:   Kamillah Didonato is a 62 y.o. 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 Patient continued to be limited by left upper sided weakness with decreased coordination and flat affect and therapy team recommended CIR for progression.  Hospital Course: Carron Mcmurry was admitted to rehab 08/30/2012 for inpatient therapies to consist of PT, ST and OT at least three hours five days a week. Past admission physiatrist, therapy team and rehab RN have worked together to provide customized collaborative inpatient rehab. Her diabetes was monitored on ac/hs basis and BS were noted to be trending upward at time of discharge. She is to resume metformin at 250 mg bid and follow up  with Dr. Candis Musa for titration of dose. Vitals were monitored on bid basis. She develop hypotension on low dose ace therefore this was discontinued. BP remain controlled off medications. Her mood has been stable and she has made good progress during her stay. Motor planning as well as RLE weakness is improving. She was fitted with right AFO to help with right foot clearance.  She is modified independent but requires intermittent supervision with higher level cognitive tasks. Daughter to assist as needed past discharge.    Rehab course: During patient's stay in rehab weekly team conferences were held to monitor patient's progress, set goals and discuss barriers to discharge. Speech therapy-addressing cognitive-linguistic goals.    Patient progressed from requiring Moderate assist to Modified independen during stay. Patient is utilizing external aids to assist with recall, self-monitoring and correcting during financial and medication management tasks. She is using word finding strategies with increased wait time. Family education was done with  daughter who is able to provide intermittnet supervision for safety with complex tasks.   Occupational therapy has focused on neuromuscular reeducation of UE as well as ADL tasks and endurance. Patient was modified independent for bathing and dressing tasks with increased time due to impaired motor planning. She is showing improved activity tolerance, improved balance, postural control, ability to compensate for deficits, as well a improved attention, coordination and improved awareness. She is modified independent for ambulating in home and requires supervision in community setting.  She will continue to receive  Outpatient PT and OT at [facility] beginning on   Disposition: 01-Home or Self Care  Diet: Diabetic.   Special Instructions: 1. Check blood sugars twice day and follow up with MD regarding metformin adjustment.  2. Wear brace on right foot when  walking.         Future Appointments Provider Department Dept Phone   09/15/2012 10:15 AM Berneice Heinrich, PT [facility] [phone]   09/15/2012 11:00 AM Trixie Deis [facility] [phone]   09/27/2012 1:00 PM [name], [facility] [phone]       Medication List    STOP taking these medications       insulin aspart 100 UNIT/ML injection  Commonly known as:  novoLOG      TAKE these medications       atorvastatin 40 MG tablet  Commonly known as:  LIPITOR  Take 1 tablet (40 mg total) by mouth daily at 6 PM.     citalopram 10 MG tablet  Commonly known as:  CELEXA  Take 10 mg by mouth daily.     clonazePAM 0.5 MG tablet  Commonly known as:  KLONOPIN  Take 0.5 mg by mouth daily as needed for anxiety.     diclofenac sodium 1 % Gel  Commonly known as:  VOLTAREN  Apply 2 g topically 4 (four) times daily. To left wrist.     dipyridamole-aspirin 200-25 MG per 12 hr capsule  Commonly known as:  AGGRENOX  Take 1 capsule by mouth 2 (two) times daily.     metFORMIN 500 MG tablet  Commonly known as:  GLUCOPHAGE  Take 0.5 tablets (250 mg total) by mouth 2 (two) times daily with a meal. RESUME AT HALF A PILL TWICE A DAY TILL FOLLOW UP WITH MD.     multivitamin with minerals Tabs  Take 1 tablet by mouth daily.       Follow-up Information   Follow up with [name], MD On 09/27/2012. (Be there at 12:30 for 1 pm appointment)    Contact information:   [address] [place] [address] [phone]       Follow up with Gates Rigg, MD. Call today. (for follow up in 6 weeks.)    Contact information:   [address] [place] [address] [phone]       Follow up with Dorrene German, MD On 09/28/2012. (APPT: 2:45 PM)    Contact information:   [address] [place] [address]        Signed: Jacquelynn Cree 09/10/2012, 6:30 PM

## 2012-09-09 NOTE — Progress Notes (Signed)
NUTRITION FOLLOW UP  Intervention:   1. Continue Resource Breeze  2. RD to continue to follow nutrition care plan  Nutrition Dx:   Inadequate oral intake related to poor appetite as evidenced by pt report. Resolved.  Goal:   Intake to meet >90% of estimated nutrition needs. Met.  Monitor:   weight trends, lab trends, I/O's, PO intake, supplement tolerance  Assessment:   PMHx of HTN, DM with peripheral neuropathy and previous CVA x 2. Admitted 6/5 with acute nonhemorrhagic infarct.  Diet changed from Heart Healthy to Carbohydrate Modified Medium on 6/11. Continues on Carbohydrate Modified Medium. Oral intake has improved since last RD visit now consuming mostly >75% of meals.  D/C planned for 6/21.  Height: Ht Readings from Last 1 Encounters:  08/30/12 5\' 7"  (1.702 m)    Weight Status:   Wt Readings from Last 1 Encounters:  09/08/12 165 lb 4.8 oz (74.98 kg)  Wt stable.  Re-estimated needs:  Kcal: 1650 - 1800 Protein: 75 - 85 grams Fluid: 1.7 - 2 liters  Skin: intact  Diet Order: Carb Control Medium   Intake/Output Summary (Last 24 hours) at 09/09/12 1107 Last data filed at 09/09/12 0800  Gross per 24 hour  Intake    480 ml  Output      0 ml  Net    480 ml    Last BM: 6/17   Labs:   Recent Labs Lab 09/06/12 0501 09/07/12 1606  NA  --  139  K  --  3.9  CL  --  104  CO2  --  25  BUN  --  27*  CREATININE 0.85 1.04  CALCIUM  --  9.7  GLUCOSE  --  123*    CBG (last 3)   Recent Labs  09/08/12 2035 09/09/12 0725 09/09/12 1048  GLUCAP 137* 121* 124*    Scheduled Meds: . atorvastatin  40 mg Oral q1800  . citalopram  10 mg Oral Daily  . diclofenac sodium  2 g Topical QID  . dipyridamole-aspirin  1 capsule Oral BID  . enoxaparin (LOVENOX) injection  40 mg Subcutaneous Q24H  . feeding supplement  1 Container Oral TID WC  . insulin aspart  0-9 Units Subcutaneous TID WC  . multivitamin with minerals  1 tablet Oral Daily    Continuous  Infusions:  none  Jarold Motto MS, RD, LDN Pager: [phone] After-hours pager: [phone]

## 2012-09-09 NOTE — Progress Notes (Signed)
Occupational Therapy Session Note  Patient Details  Name: Sophia Rodriguez MRN: [id number] Date of Birth: 1950-12-02  Today's Date: 09/09/2012  Short Term Goals: Week 1:  OT Short Term Goal 1 (Week 1): Pt will complete LB dressing with supervision for standing balance and no more then 3 verbal cue for sequencing/tech OT Short Term Goal 1 - Progress (Week 1): Met OT Short Term Goal 2 (Week 1): Pt will complete bathing task with supervision and no more then 3 verbal cue for sequencing OT Short Term Goal 2 - Progress (Week 1): Met OT Short Term Goal 3 (Week 1): Pt complete toileting transfer with min assist and 1 verbal cue for motor planning  OT Short Term Goal 3 - Progress (Week 1): Met OT Short Term Goal 4 (Week 1): Pt will complete shower transfer with min assist OT Short Term Goal 4 - Progress (Week 1): Met OT Short Term Goal 5 (Week 1): Pt willl sequence and initiate morning self-care tasks with no more then 2 verbal cues. OT Short Term Goal 5 - Progress (Week 1): Met Week 2:  OT Short Term Goal 1 (Week 2): STGs=LTGs  Skilled Therapeutic Interventions/Progress Updates:  Community Outing: Time: 1100-1230 (90 mins) Pt with no report of pain.  Pt participated in community outing with two other patients. Skilled intervention focused on community mobility (mobility on various surfaces, ambulating up/down curbs), money mgmt, fxal transfers, sit<>stand, fxal endurance, car transfers, fxal mobility, and education on safety in the community (navigating around obstacles). At end of tx session, pt seated in w/c with call bell within reach.  Therapy Documentation Precautions:  Precautions Precautions: Fall Precaution Comments: 2-3 falls at home within last month per pt.  Restrictions Weight Bearing Restrictions: No  See FIM for current functional status   [name] 09/09/2012, 7:29 AM

## 2012-09-09 NOTE — Progress Notes (Signed)
Speech Language Pathology Daily Session Note  Patient Details  Name: Sophia Rodriguez MRN: [id number] Date of Birth: 10/05/1950  Today's Date: 09/09/2012 Time: 1405-1430 Time Calculation (min): 25 min  Short Term Goals: Week 1: SLP Short Term Goal 1 (Week 1): Patient will utilize word finding strategies durign conversation with Supervision level verbal cues. SLP Short Term Goal 2 (Week 1): Patient will utilize external aids to assist with recall of daily information with Supervision level verbal cues. SLP Short Term Goal 3 (Week 1): Patient will demonstrate moderately complex problem solving while completing familiar tasks with Min assist verbal cues.  Skilled Therapeutic Interventions: Skilled treatment session focused on addressing new learning alternating attention task.  SLP facilitated session with new card game and written directions to assist with recall, as a result, patient was Min assist faded to Mod I by end of session with use of external aid to assist with recall.  Goals met and family education scheduled for tomorrow with daughter, then discharge.     FIM:  Comprehension Comprehension Mode: Auditory Comprehension: 6-Follows complex conversation/direction: With extra time/assistive device Expression Expression Mode: Verbal Expression: 6-Expresses complex ideas: With extra time/assistive device Social Interaction Social Interaction: 6-Interacts appropriately with others with medication or extra time (anti-anxiety, antidepressant). Problem Solving Problem Solving: 6-Solves complex problems: With extra time Memory Memory: 6-Assistive device: No helper  Pain Pain Assessment Pain Assessment: No/denies pain Pain Score: 0-No pain  Therapy/Group: Individual Therapy  Charlane Ferretti., CCC-SLP [phone]  Sophia Rodriguez 09/09/2012, 3:28 PM

## 2012-09-09 NOTE — Progress Notes (Signed)
Social Work Patient ID: Sophia Rodriguez, female   DOB: Mar 02, 1951, 62 y.o.   MRN: [id number] Met with pt and spoke with daughter regarding team conference progression toward goals-which were upgraded to mod/i level. Pt 's discharge set for Friday.  Daughter will be here at 2;00 to attend speech therapy and transport pt home.  Pt feels much better today and Had a good day yesterday.  Melissa-SPT aware of daughter coming tomorrow and have place don the scheduling board for tomorrow.

## 2012-09-09 NOTE — Progress Notes (Signed)
Occupational Therapy Discharge Summary  Patient Details  Name: Sophia Rodriguez MRN: [id number] Date of Birth: 1950/07/25  Today's Date: 09/09/2012 Time:  0930- 1010 Time calculation (min): 40 min    Patient has met 9 of 9 long term goals due to improved activity tolerance, improved balance, postural control, improved awareness and improved coordination.  Patient to discharge at overall Modified Independent level.  Patient's care partner not necessary as patient discharging at Mod I level to provide the necessary physical and cognitive assistance at discharge.    Reasons goals not met: Rodriguez/A as all LTGs met  Recommendation:  Patient will benefit from ongoing skilled OT services in outpatient setting to continue to advance functional skills in the area of BADL and iADL.  Equipment: Patient has all necessary equipment  Reasons for discharge: treatment goals met  Patient/family agrees with progress made and goals achieved: Yes  OT Discharge Skilled Therapeutic Intervention Therapy session focused on safety, activity tolerance, dynamic standing balance, motor planning during functional transfers, and ADL retraining. Pt competed all self-care tasks at Mod I level. Completed shower and toilet transfer at Mod I level using RW. Pt demonstrated good safety with dynamic standing balance and RW when retrieving clothing from low drawers. Pt recalled all techniques for energy conservation and safety during self-care tasks. Pt reported feeling "great" about going home and had no questions or concerns at this time. Pt demonstrates good safety throughout tasks and continues to require increased time secondary to impaired motor planning. Required occasional cues to pick up RLE during short distances of ambulation in room.   Precautions/Restrictions  Precautions Precautions: Fall Precaution Comments: 2-3 falls at home within last month per pt.  Restrictions Weight Bearing Restrictions: No General    Vital Signs   Pain   ADL   Vision/Perception  Vision - History Baseline Vision: Wears glasses for distance only Patient Visual Report: No change from baseline Vision - Assessment Convergence: Within functional limits Visual Fields: No apparent deficits  Cognition Overall Cognitive Status: Within Functional Limits for tasks assessed Arousal/Alertness: Awake/alert Orientation Level: Oriented to person;Oriented to place;Oriented to situation;Oriented X4;Oriented to time Attention: Selective Sustained Attention: Appears intact Selective Attention: Appears intact Memory: Appears intact Awareness: Appears intact Problem Solving: Appears intact Safety/Judgment: Appears intact Sensation Sensation Light Touch: Appears Intact Hot/Cold: Appears Intact Proprioception: Appears Intact Coordination Gross Motor Movements are Fluid and Coordinated: No Fine Motor Movements are Fluid and Coordinated: Yes Finger Nose Finger Test: improved accuracy with LUE Motor    Mobility     Trunk/Postural Assessment     Balance   Extremity/Trunk Assessment RUE Assessment RUE Assessment: Within Functional Limits LUE Assessment LUE Assessment: Within Functional Limits  See FIM for current functional status  Sophia Rodriguez 09/09/2012, 10:03 AM

## 2012-09-10 ENCOUNTER — Ambulatory Visit: Payer: Medicare Other | Admitting: Physical Therapy

## 2012-09-10 ENCOUNTER — Inpatient Hospital Stay (HOSPITAL_COMMUNITY): Payer: Medicare Other | Admitting: Speech Pathology

## 2012-09-10 LAB — GLUCOSE, CAPILLARY: Glucose-Capillary: 155 mg/dL — ABNORMAL HIGH (ref 70–99)

## 2012-09-10 MED ORDER — METFORMIN HCL 500 MG PO TABS: 250.0000 mg | ORAL_TABLET | Freq: Two times a day (BID) | ORAL | Status: DC

## 2012-09-10 MED ORDER — DICLOFENAC SODIUM 1 % TD GEL: 2.0000 g | Freq: Four times a day (QID) | TRANSDERMAL | Status: DC

## 2012-09-10 MED ORDER — ADULT MULTIVITAMIN W/MINERALS CH: 1.0000 | ORAL_TABLET | Freq: Every day | ORAL | Status: DC

## 2012-09-10 MED ORDER — ASPIRIN-DIPYRIDAMOLE ER 25-200 MG PO CP12: 1.0000 | ORAL_CAPSULE | Freq: Two times a day (BID) | ORAL | Status: DC

## 2012-09-10 NOTE — Progress Notes (Signed)
Discharged to home accompanied by daughter. Discharge info given to patient by PA. No other questions noted. Pt reported ready for discharge. Sophia Rodriguez

## 2012-09-10 NOTE — Progress Notes (Signed)
Social Work Discharge Note Discharge Note  The overall goal for the admission was met for:   Discharge location: Yes-HOME WITH DAUGHTER CHECKING DAILY ON HER  Length of Stay: Yes-11 DAYS  Discharge activity level: Yes-MOD/I LEVEL  Home/community participation: Yes  Services provided included: MD, RD, PT, OT, SLP, RN, TR, Pharmacy, Neuropsych and SW  Financial Services: Medicare and Private Insurance: BCBS  Follow-up services arranged: Outpatient: [facility]-OP[facility],OT,SPT 6/25 9;45-11;45  Comments (or additional information):PT PLEASED WITH PROGRESS AND READY FOR D/C.  SCAT APPLICATION PENDING  Patient/Family verbalized understanding of follow-up arrangements: Yes  Individual responsible for coordination of the follow-up plan: SELF & EVEL-DAUGHTER  Confirmed correct DME delivered: Lucy Chris 09/10/2012    Lucy Chris

## 2012-09-10 NOTE — Progress Notes (Signed)
Speech Language Pathology Daily Session Note  Patient Details  Name: Sophia Rodriguez MRN: [id number] Date of Birth: 08-05-1950  Today's Date: 09/10/2012 Time: 1355-1405 Time Calculation (min): 10 min  Short Term Goals: Week 1: SLP Short Term Goal 1 (Week 1): Patient will utilize word finding strategies durign conversation with Supervision level verbal cues. SLP Short Term Goal 2 (Week 1): Patient will utilize external aids to assist with recall of daily information with Supervision level verbal cues. SLP Short Term Goal 3 (Week 1): Patient will demonstrate moderately complex problem solving while completing familiar tasks with Min assist verbal cues.  Skilled Therapeutic Interventions: Skilled treatment session focused on completing patient and family education.  Patient and daughter present for session/discussion and were able to verbalize 3 things to intermittent assist patient with after dischrage (stairs, medication/financial management and attending to right foot when walking.  Patient overall Mod I in therapy sessions with no follow-up services recommended at this time.     FIM:  Comprehension Comprehension Mode: Auditory Comprehension: 6-Follows complex conversation/direction: With extra time/assistive device Expression Expression Mode: Verbal Expression: 6-Expresses complex ideas: With extra time/assistive device Social Interaction Social Interaction: 6-Interacts appropriately with others with medication or extra time (anti-anxiety, antidepressant). Problem Solving Problem Solving: 6-Solves complex problems: With extra time Memory Memory: 6-Assistive device: No helper FIM - Eating Eating Activity: 7: Complete independence:no helper  Pain Pain Assessment Pain Assessment: No/denies pain  Therapy/Group: Individual Therapy   Speech Language Pathology Discharge Summary  Patient Details  Name: Sophia Rodriguez MRN: [id number] Date of Birth: Jul 23, 1950  Today's Date:  09/10/2012  Patient has met 3 of 3 long term goals.  Patient to discharge at overall Modified Independent level.  Reasons goals not met: n/a   Clinical Impression/Discharge Summary: Patient met 3 out of 3 long term goals during CIR stay due to gains in use of compensatory strategies for cognitive-linguistic deficits there were new and some left over from previous CVA.  Patient progressed from requiring Mod assist to [facility] I during stay.  Patient is utilizing external aids to assist with recall, self-monitoring and correcting during financial and medication management tasks and is using word finding strategies with increased wait time.  Family education complete and daughter is able to provide intermittnet supervision for safety with complex tasks.  As a result, goals are met and no follow-up services are recommended at this time.   Care Partner:  Caregiver Able to Provide Assistance:  (n/a)  Type of Caregiver Assistance:  (n/a)  Recommendation:  None  Rationale for SLP Follow Up:  (n/a)   Equipment: none   Reasons for discharge: Treatment goals met;Discharged from hospital   Patient/Family Agrees with Progress Made and Goals Achieved: Yes   See FIM for current functional status  Charlane Ferretti., CCC-SLP [phone]  Sophia Rodriguez 09/10/2012, 2:12 PM

## 2012-09-10 NOTE — Progress Notes (Signed)
Patient ID: Sophia Rodriguez, female   DOB: February 06, 1951, 62 y.o.   MRN: [id number] Subjective/Complaints: 62 y.o. 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 She was admitted on 08/26/12 with 24 hour history of difficulty walking, decrease in balance as well as increased right sided weakness. MRI of the brain showed acute nonhemorrhagic infarct at the junction of the right thalamus and posterior limb of the right internal capsule as well as remote moderate to large size infarct medial aspect left frontal and parietal lobes as well as left globus pallidus and remote left paracentral small infarct. MRA of the head with atherosclerotic type changes. MRA of the neck without significant stenosis. Echocardiogram with EF 60-65% and source of emboli. Carotid Dopplers with no ICA stenosis. Neurology recommended changing patient to aggrenox for thrombotic stroke due to SVD  Slept ok  No pain  Review of Systems  Neurological: Positive for focal weakness.  All other systems reviewed and are negative.    Objective: Vital Signs: Blood pressure 128/87, pulse 101, temperature 97.2 F (36.2 C), temperature source Oral, resp. rate 18, height 5\' 7"  (1.702 m), weight 75.841 kg (167 lb 3.2 oz), SpO2 99.00%. No results found. Results for orders placed during the hospital encounter of 08/30/12 (from the past 72 hour(s))  GLUCOSE, CAPILLARY     Status: Abnormal   Collection Time    09/07/12 11:28 AM      Result Value Range   Glucose-Capillary 197 (*) 70 - 99 mg/dL   Comment 1 Notify RN    BASIC METABOLIC PANEL     Status: Abnormal   Collection Time    09/07/12  4:06 PM      Result Value Range   Sodium 139  135 - 145 mEq/L   Potassium 3.9  3.5 - 5.1 mEq/L   Chloride 104  96 - 112 mEq/L   CO2 25  19 - 32 mEq/L   Glucose, Bld 123 (*) 70 - 99 mg/dL   BUN 27 (*) 6 - 23 mg/dL   Creatinine, Ser 7.82  0.50 - 1.10 mg/dL   Calcium 9.7  8.4 - 95.6 mg/dL   GFR calc non Af Amer 57 (*) >90 mL/min   GFR calc Af Amer 66 (*) >90 mL/min   Comment:            The eGFR has been calculated     using the CKD EPI equation.     This calculation has not been     validated in all clinical     situations.     eGFR's persistently     <90 mL/min signify     possible Chronic Kidney Disease.  GLUCOSE, CAPILLARY     Status: None   Collection Time    09/07/12  4:47 PM      Result Value Range   Glucose-Capillary 97  70 - 99 mg/dL   Comment 1 Notify RN    URINE CULTURE     Status: None   Collection Time    09/07/12  8:00 PM      Result Value Range   Specimen Description URINE, CLEAN CATCH     Special Requests NONE     Culture  Setup Time 09/07/2012 20:20     Colony Count 70,000 COLONIES/ML     Culture       Value: Multiple bacterial morphotypes present, none predominant. Suggest appropriate recollection if  clinically indicated.   Report Status 09/09/2012 FINAL    URINALYSIS, ROUTINE W REFLEX MICROSCOPIC     Status: Abnormal   Collection Time    09/07/12  8:00 PM      Result Value Range   Color, Urine YELLOW  YELLOW   APPearance CLOUDY (*) CLEAR   Specific Gravity, Urine 1.025  1.005 - 1.030   pH 5.0  5.0 - 8.0   Glucose, UA NEGATIVE  NEGATIVE mg/dL   Hgb urine dipstick NEGATIVE  NEGATIVE   Bilirubin Urine NEGATIVE  NEGATIVE   Ketones, ur NEGATIVE  NEGATIVE mg/dL   Protein, ur NEGATIVE  NEGATIVE mg/dL   Urobilinogen, UA 0.2  0.0 - 1.0 mg/dL   Nitrite NEGATIVE  NEGATIVE   Leukocytes, UA SMALL (*) NEGATIVE  URINE MICROSCOPIC-ADD ON     Status: Abnormal   Collection Time    09/07/12  8:00 PM      Result Value Range   Squamous Epithelial / LPF FEW (*) RARE   WBC, UA 3-6  <3 WBC/hpf   RBC / HPF 0-2  <3 RBC/hpf   Bacteria, UA RARE  RARE  GLUCOSE, CAPILLARY     Status: Abnormal   Collection Time    09/07/12  8:42 PM      Result Value Range   Glucose-Capillary 158 (*)  70 - 99 mg/dL   Comment 1 Notify RN    GLUCOSE, CAPILLARY     Status: Abnormal   Collection Time    09/08/12  7:26 AM      Result Value Range   Glucose-Capillary 127 (*) 70 - 99 mg/dL   Comment 1 Notify RN    GLUCOSE, CAPILLARY     Status: Abnormal   Collection Time    09/08/12 11:30 AM      Result Value Range   Glucose-Capillary 110 (*) 70 - 99 mg/dL   Comment 1 Notify RN    GLUCOSE, CAPILLARY     Status: Abnormal   Collection Time    09/08/12  4:48 PM      Result Value Range   Glucose-Capillary 118 (*) 70 - 99 mg/dL   Comment 1 Notify RN    GLUCOSE, CAPILLARY     Status: Abnormal   Collection Time    09/08/12  8:35 PM      Result Value Range   Glucose-Capillary 137 (*) 70 - 99 mg/dL  GLUCOSE, CAPILLARY     Status: Abnormal   Collection Time    09/09/12  7:25 AM      Result Value Range   Glucose-Capillary 121 (*) 70 - 99 mg/dL   Comment 1 Notify RN    GLUCOSE, CAPILLARY     Status: Abnormal   Collection Time    09/09/12 10:48 AM      Result Value Range   Glucose-Capillary 124 (*) 70 - 99 mg/dL   Comment 1 Notify RN    GLUCOSE, CAPILLARY     Status: None   Collection Time    09/09/12  5:02 PM      Result Value Range   Glucose-Capillary 90  70 - 99 mg/dL  GLUCOSE, CAPILLARY     Status: Abnormal   Collection Time    09/09/12  9:15 PM      Result Value Range   Glucose-Capillary 158 (*) 70 - 99 mg/dL  GLUCOSE, CAPILLARY     Status: Abnormal   Collection Time    09/10/12  7:27 AM  Result Value Range   Glucose-Capillary 114 (*) 70 - 99 mg/dL   Comment 1 Notify RN        Nursing note and vitals reviewed.  Constitutional: She is oriented to person, place, and time. She appears well-developed and well-nourished.  HENT:  Head: Normocephalic and atraumatic.  Eyes: Pupils are equal, round, and reactive to light.  Neck: Normal range of motion. Neck supple.  Cardiovascular: Normal rate and regular rhythm.  Pulmonary/Chest: Effort normal and breath sounds normal.  No respiratory distress. She has no wheezes.  Abdominal: Soft. Bowel sounds are normal.  Musculoskeletal: She exhibits no edema and no tenderness. Normal appearing arches, feet--non tender with ROM/palpation Neurological: She is alert and oriented to person, place, and time.  Soft spoken. Speech clear. Some delay in initiation but able to followcommands without difficulty. LUE weakness with decrease in fine motor control. RLE with minimal weakness.  Skin: Skin is warm and dry.  Psychiatric: Her speech is normal and behavior is normal. Thought content normal. Her mood appears a little flat. Cognition and memory are normal.  Motor strength: 4/5 in the right deltoid, biceps, triceps, grip, hip flexor, knee extensors, ankle dorsiflexor plantar flexor  4 minus/5 in the left deltoid, biceps, triceps, grip, hip flexor, knee extensors, ankle dorsiflexor and plantar flexor    Cerebellar testing no evidence of ataxia on finger nose to finger and heel-to-shin testing are   Assessment/Plan: 1. 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 Stable for D/C today F/u PCP in 1-2 weeks F/u PM&R 3 weeks See D/C summary See D/C instructions  FIM - Bathing Bathing Steps Patient Completed: Chest;Right Arm;Left Arm;Abdomen;Left upper leg;Right upper leg;Buttocks;Front perineal area;Right lower leg (including foot);Left lower leg (including foot) Bathing: 6: Assistive device (Comment) (grab bars when in standing)  FIM - Upper Body Dressing/Undressing Upper body dressing/undressing steps patient completed: Thread/unthread right bra strap;Thread/unthread left bra strap;Hook/unhook bra;Thread/unthread right sleeve of front closure shirt/dress;Thread/unthread left sleeve of front closure shirt/dress;Pull shirt  around back of front closure shirt/dress;Button/unbutton shirt Upper body dressing/undressing: 6: More than reasonable amount of time FIM - Lower Body Dressing/Undressing Lower body dressing/undressing steps patient completed: Thread/unthread right underwear leg;Thread/unthread left underwear leg;Pull underwear up/down;Thread/unthread right pants leg;Thread/unthread left pants leg;Pull pants up/down;Fasten/unfasten pants;Don/Doff right sock;Don/Doff left sock;Don/Doff right shoe;Don/Doff left shoe;Fasten/unfasten right shoe;Fasten/unfasten left shoe Lower body dressing/undressing: 6: More than reasonable amount of time  FIM - Toileting Toileting steps completed by patient: Adjust clothing prior to toileting;Performs perineal hygiene;Adjust clothing after toileting Toileting Assistive Devices: Grab bar or rail for support Toileting: 6: More than reasonable amount of time  FIM - Diplomatic Services operational officer Devices: Grab bars;Walker Toilet Transfers: 6-To toilet/ BSC;6-From toilet/BSC  FIM - Banker Devices: Arm rests;Walker Bed/Chair Transfer: 6: Supine > Sit: No assist;6: Sit > Supine: No assist;6: Assistive device: no helper;6: Bed > Chair or W/C: No assist;6: Chair or W/C > Bed: No assist  FIM - Locomotion: Wheelchair Distance: 150 Locomotion: Wheelchair: 5: Travels 150 ft or more: maneuvers on rugs and over door sills with supervision, cueing or coaxing FIM - Locomotion: Ambulation Locomotion: Ambulation Assistive Devices: Designer, industrial/product Ambulation/Gait Assistance: 6: Modified independent (Device/Increase time) Locomotion: Ambulation: 6: Travels 150 ft or more with assistive device/no helper  Comprehension Comprehension Mode: Auditory Comprehension: 6-Follows complex conversation/direction:  With extra time/assistive device  Expression Expression Mode: Verbal Expression: 6-Expresses complex ideas: With extra time/assistive  device  Social Interaction Social Interaction: 6-Interacts appropriately with others with medication or extra time (anti-anxiety, antidepressant).  Problem Solving Problem Solving: 6-Solves complex problems: With extra time  Memory Memory: 6-More than reasonable amt of time  Medical Problem List and Plan:  1. DVT Prophylaxis/Anticoagulation: Pharmaceutical: Lovenox  2. Pain Management: Will use tylenol prn for pain. Added voltaren gel for left wrist pain. --controlled at present 3. Mood: Flat affect with underlying anxiety noted. Will continue celexa and klonopin. Needs ego support. Will have LCSW follow up for evaluation.  4. Neuropsych: This patient is capable of making decisions on his/her own behalf.  5. DM type 2 with neuropathy: Monitor BS with AC/HS checks. Off metformin. Under control  -follow for increased pain---minor at present. 6. HTN: Will monitor with bid checks. on low dose ace for better control will close monitoring for hypotension. .  7. Dyslipidemia: LDL--128.   Lipitor  .      LOS (Days) 11 A FACE TO FACE EVALUATION WAS PERFORMED  Lukah Goswami E 09/10/2012, 8:16 AM

## 2012-09-14 ENCOUNTER — Ambulatory Visit: Payer: Medicare Other | Admitting: Physical Therapy

## 2012-09-14 ENCOUNTER — Encounter: Payer: Medicare Other | Admitting: Occupational Therapy

## 2012-09-15 ENCOUNTER — Ambulatory Visit: Payer: Medicare Other | Admitting: Rehabilitative and Restorative Service Providers"

## 2012-09-15 ENCOUNTER — Ambulatory Visit: Payer: Medicare Other | Admitting: Occupational Therapy

## 2012-09-17 ENCOUNTER — Ambulatory Visit: Payer: Medicare Other | Admitting: Physical Therapy

## 2012-09-17 ENCOUNTER — Encounter: Payer: Medicare Other | Admitting: Occupational Therapy

## 2012-09-21 ENCOUNTER — Emergency Department (HOSPITAL_COMMUNITY): Payer: Medicare Other

## 2012-09-21 ENCOUNTER — Encounter (HOSPITAL_COMMUNITY): Payer: Self-pay | Admitting: Emergency Medicine

## 2012-09-21 ENCOUNTER — Observation Stay (HOSPITAL_COMMUNITY)
Admission: EM | Admit: 2012-09-21 | Discharge: 2012-09-22 | Disposition: A | Discharge status: Rehabilitation Facility | Payer: Medicare Other | Attending: Internal Medicine | Admitting: Internal Medicine

## 2012-09-21 DIAGNOSIS — E1149 Type 2 diabetes mellitus with other diabetic neurological complication: Secondary | ICD-10-CM

## 2012-09-21 DIAGNOSIS — R51 Headache: Secondary | ICD-10-CM | POA: Insufficient documentation

## 2012-09-21 DIAGNOSIS — R262 Difficulty in walking, not elsewhere classified: Secondary | ICD-10-CM | POA: Insufficient documentation

## 2012-09-21 DIAGNOSIS — I69959 Hemiplegia and hemiparesis following unspecified cerebrovascular disease affecting unspecified side: Secondary | ICD-10-CM | POA: Insufficient documentation

## 2012-09-21 DIAGNOSIS — E1142 Type 2 diabetes mellitus with diabetic polyneuropathy: Secondary | ICD-10-CM | POA: Insufficient documentation

## 2012-09-21 DIAGNOSIS — Z79899 Other long term (current) drug therapy: Secondary | ICD-10-CM | POA: Insufficient documentation

## 2012-09-21 DIAGNOSIS — I6350 Cerebral infarction due to unspecified occlusion or stenosis of unspecified cerebral artery: Principal | ICD-10-CM | POA: Insufficient documentation

## 2012-09-21 DIAGNOSIS — I10 Essential (primary) hypertension: Secondary | ICD-10-CM

## 2012-09-21 DIAGNOSIS — R471 Dysarthria and anarthria: Secondary | ICD-10-CM | POA: Insufficient documentation

## 2012-09-21 DIAGNOSIS — I639 Cerebral infarction, unspecified: Secondary | ICD-10-CM

## 2012-09-21 LAB — CBC WITH DIFFERENTIAL/PLATELET
Basophils Relative: 0 % (ref 0–1)
HCT: 37 % (ref 36.0–46.0)
Hemoglobin: 12.4 g/dL (ref 12.0–15.0)
Lymphocytes Relative: 29 % (ref 12–46)
Lymphs Abs: 1.9 10*3/uL (ref 0.7–4.0)
Monocytes Absolute: 0.5 10*3/uL (ref 0.1–1.0)
Monocytes Relative: 8 % (ref 3–12)
Neutro Abs: 3.9 10*3/uL (ref 1.7–7.7)
Neutrophils Relative %: 61 % (ref 43–77)
RBC: 4.69 MIL/uL (ref 3.87–5.11)
WBC: 6.4 10*3/uL (ref 4.0–10.5)

## 2012-09-21 LAB — COMPREHENSIVE METABOLIC PANEL
Albumin: 3.7 g/dL (ref 3.5–5.2)
Alkaline Phosphatase: 95 U/L (ref 39–117)
BUN: 17 mg/dL (ref 6–23)
CO2: 22 mEq/L (ref 19–32)
Chloride: 104 mEq/L (ref 96–112)
GFR calc non Af Amer: 49 mL/min — ABNORMAL LOW (ref >90)
Glucose, Bld: 92 mg/dL (ref 70–99)
Potassium: 4.4 mEq/L (ref 3.5–5.1)
Total Bilirubin: 0.3 mg/dL (ref 0.3–1.2)

## 2012-09-21 MED ORDER — LORAZEPAM 2 MG/ML IJ SOLN
1.0000 mg | Freq: Once | INTRAMUSCULAR | Status: AC
  Administered 2012-09-21: 1 mg via INTRAVENOUS
  Filled 2012-09-21: qty 1

## 2012-09-21 MED ORDER — DEXAMETHASONE SODIUM PHOSPHATE 10 MG/ML IJ SOLN
10.0000 mg | Freq: Once | INTRAMUSCULAR | Status: AC
  Administered 2012-09-21: 10 mg via INTRAMUSCULAR
  Filled 2012-09-21: qty 1

## 2012-09-21 MED ORDER — METOCLOPRAMIDE HCL 5 MG/ML IJ SOLN
10.0000 mg | Freq: Once | INTRAMUSCULAR | Status: AC
  Administered 2012-09-21: 10 mg via INTRAVENOUS
  Filled 2012-09-21: qty 2

## 2012-09-21 MED ORDER — DIPHENHYDRAMINE HCL 50 MG/ML IJ SOLN
25.0000 mg | Freq: Once | INTRAMUSCULAR | Status: AC
  Administered 2012-09-21: 25 mg via INTRAVENOUS
  Filled 2012-09-21 (×2): qty 1

## 2012-09-21 NOTE — ED Provider Notes (Signed)
History    CSN: [id number] Arrival date & time 09/21/12  5956  First MD Initiated Contact with Patient 09/21/12 2027     Chief Complaint  Patient presents with  . Headache   (Consider location/radiation/quality/duration/timing/severity/associated sxs/prior Treatment) HPI Comments: Pt w/ hx of multiple CVAs w/ right and left sided residual weakness, also hx of DM and HTN now w/ HA x 2 days and worsening dysarthria and left sided weakness. No fever, trauma or confusion. No diplopia or blurred vision.   Patient is a 62 y.o. female presenting with general illness. The history is provided by the patient. No language interpreter was used.  Illness Location:  CNS Quality:  Headache and weakness Severity:  Moderate Onset quality:  Gradual Duration:  2 days Timing:  Constant Progression:  Worsening Chronicity:  New Associated symptoms: headaches   Associated symptoms: no abdominal pain, no chest pain, no congestion, no cough, no diarrhea, no fever, no nausea, no rash, no shortness of breath, no sore throat and no vomiting    Past Medical History  Diagnosis Date  . Stroke   . Hypertension   . Diabetes mellitus    History reviewed. No pertinent past surgical history. Family History  Problem Relation Age of Onset  . Stroke Brother    History  Substance Use Topics  . Smoking status: Former Smoker    Quit date: 05/28/2007  . Smokeless tobacco: Not on file  . Alcohol Use: No   OB History   Grav Para Term Preterm Abortions TAB SAB Ect Mult Living                 Review of Systems  Constitutional: Negative for fever and chills.  HENT: Negative for congestion and sore throat.   Respiratory: Negative for cough and shortness of breath.   Cardiovascular: Negative for chest pain and leg swelling.  Gastrointestinal: Negative for nausea, vomiting, abdominal pain, diarrhea and constipation.  Genitourinary: Negative for dysuria and frequency.  Skin: Negative for color change and rash.   Neurological: Positive for weakness and headaches. Negative for dizziness.  Psychiatric/Behavioral: Negative for confusion and agitation.  All other systems reviewed and are negative.    Allergies  Review of patient's allergies indicates no known allergies.  Home Medications   Current Outpatient Rx  Name  Route  Sig  Dispense  Refill  . atorvastatin (LIPITOR) 40 MG tablet   Oral   Take 1 tablet (40 mg total) by mouth daily at 6 PM.         . citalopram (CELEXA) 10 MG tablet   Oral   Take 10 mg by mouth daily.         . clonazePAM (KLONOPIN) 0.5 MG tablet   Oral   Take 0.5 mg by mouth daily as needed for anxiety.         . diclofenac sodium (VOLTAREN) 1 % GEL   Topical   Apply 2 g topically 4 (four) times daily. To left wrist.   4 Tube   1   . dipyridamole-aspirin (AGGRENOX) 200-25 MG per 12 hr capsule   Oral   Take 1 capsule by mouth 2 (two) times daily.   60 capsule   1   . metFORMIN (GLUCOPHAGE) 500 MG tablet   Oral   Take 0.5 tablets (250 mg total) by mouth 2 (two) times daily with a meal. RESUME AT HALF A PILL TWICE A DAY TILL FOLLOW UP WITH MD.         .  Multiple Vitamin (MULTIVITAMIN WITH MINERALS) TABS   Oral   Take 1 tablet by mouth daily.          BP 142/95  Pulse 92  Temp(Src) 98.1 F (36.7 C) (Oral)  Resp 16  SpO2 100% Physical Exam  Vitals reviewed. Constitutional: She is oriented to person, place, and time. She appears well-developed and well-nourished. No distress.  HENT:  Head: Normocephalic and atraumatic.  Eyes: EOM are normal. Pupils are equal, round, and reactive to light.  Neck: Normal range of motion. Neck supple.  Cardiovascular: Normal rate and regular rhythm.   Pulmonary/Chest: Effort normal. No respiratory distress.  Abdominal: Soft. She exhibits no distension.  Musculoskeletal: Normal range of motion. She exhibits no edema.  Neurological: She is alert and oriented to person, place, and time. A cranial nerve deficit  (mild left facial droop) is present. No sensory deficit. She exhibits abnormal muscle tone. Coordination abnormal. GCS eye subscore is 4. GCS verbal subscore is 5. GCS motor subscore is 6.  4/5 LUE and LLE strength, + pronator drift on left. Dysarthria.   Skin: Skin is warm and dry.  Psychiatric: She has a normal mood and affect. Her behavior is normal.    ED Course  Procedures (including critical care time) Labs Reviewed  CBC WITH DIFFERENTIAL  COMPREHENSIVE METABOLIC PANEL   Results for orders placed during the hospital encounter of 09/21/12  CBC WITH DIFFERENTIAL      Result Value Range   WBC 6.4  4.0 - 10.5 K/uL   RBC 4.69  3.87 - 5.11 MIL/uL   Hemoglobin 12.4  12.0 - 15.0 g/dL   HCT 16.1  09.6 - 04.5 %   MCV 78.9  78.0 - 100.0 fL   MCH 26.4  26.0 - 34.0 pg   MCHC 33.5  30.0 - 36.0 g/dL   RDW 40.9  81.1 - 91.4 %   Platelets 273  150 - 400 K/uL   Neutrophils Relative % 61  43 - 77 %   Neutro Abs 3.9  1.7 - 7.7 K/uL   Lymphocytes Relative 29  12 - 46 %   Lymphs Abs 1.9  0.7 - 4.0 K/uL   Monocytes Relative 8  3 - 12 %   Monocytes Absolute 0.5  0.1 - 1.0 K/uL   Eosinophils Relative 2  0 - 5 %   Eosinophils Absolute 0.1  0.0 - 0.7 K/uL   Basophils Relative 0  0 - 1 %   Basophils Absolute 0.0  0.0 - 0.1 K/uL  COMPREHENSIVE METABOLIC PANEL      Result Value Range   Sodium 139  135 - 145 mEq/L   Potassium 4.4  3.5 - 5.1 mEq/L   Chloride 104  96 - 112 mEq/L   CO2 22  19 - 32 mEq/L   Glucose, Bld 92  70 - 99 mg/dL   BUN 17  6 - 23 mg/dL   Creatinine, Ser 7.82 (*) 0.50 - 1.10 mg/dL   Calcium 95.6  8.4 - 21.3 mg/dL   Total Protein 7.4  6.0 - 8.3 g/dL   Albumin 3.7  3.5 - 5.2 g/dL   AST 18  0 - 37 U/L   ALT 19  0 - 35 U/L   Alkaline Phosphatase 95  39 - 117 U/L   Total Bilirubin 0.3  0.3 - 1.2 mg/dL   GFR calc non Af Amer 49 (*) >90 mL/min   GFR calc Af Amer 57 (*) >90 mL/min  DG Chest 2 View (Final result)  Result time: 09/22/12 01:25:40    Final result by Rad Results  In Interface (09/22/12 01:25:40)    Narrative:   *RADIOLOGY REPORT*  Clinical Data: Weakness. Headache.  CHEST - 2 VIEW  Comparison: Plain film chest 03/08/2009.  Findings: Lungs are clear. Heart size is normal. No pneumothorax or pleural fluid.  IMPRESSION: No acute disease.   Original Report Authenticated By: Holley Dexter, M.D.             MR Brain Wo Contrast (Final result)  Result time: 09/21/12 23:53:59    Final result by Rad Results In Interface (09/21/12 23:53:59)    Narrative:   *RADIOLOGY REPORT*  Clinical Data: left-sided weakness  MRI HEAD WITHOUT CONTRAST  Technique: Multiplanar, multiecho pulse sequences of the brain and surrounding structures were obtained according to standard protocol without intravenous contrast.  Comparison: Prior CT performed earlier on the same day as well as prior MRI from 08/26/2012.  Findings: Small focus of restricted diffusion is present at the junction of the right thalamus and posterior limb of the right internal capsule, in the region of previously identified infarct seen on prior MRI from 08/26/2012. This finding most likely represents a new infarction in this region. There is subtle signal dropout on the corresponding ADC map.  Remote moderate to large size infarct at the medial aspect of the left frontal and parietal lobes and in the anterior communicating artery distribution with associated encephalomalacia is again seen. Secondary ex vacuo dilatation of the left lateral ventricle with Wallerian degeneration again noted. Remote infarcts involving the left pons, and left globus pallidus are again noted.  No intracranial mass lesions or midline shift. No extra-axial fluid collection.  IMPRESSION: 1. Acute ischemic infarct involving the posterior limb of the right internal capsule, at or just adjacent to recently identified acute ischemic infarct seen in this region on MRI from 08/26/2012. 2. Stable  appearance of additional remote infarcts as detailed above.   Original Report Authenticated By: Rise Mu, M.D.             CT Head Wo Contrast (Final result)  Result time: 09/21/12 22:00:59    Final result by Rad Results In Interface (09/21/12 22:00:59)    Narrative:   *RADIOLOGY REPORT*  Clinical Data: Acute onset occipital headache 2 days ago. History of prior infarct.  CT HEAD WITHOUT CONTRAST  Technique: Contiguous axial images were obtained from the base of the skull through the vertex without contrast.  Comparison: Brain MRI 08/26/2012 and head CT scan 07/01/2012.  Findings: Remote left PCA territory infarct is identified. No evidence of acute infarction, hemorrhage, mass lesion, mass effect, midline shift or abnormal excessive fluid collection. Chronic microvascular ischemic change is noted. Imaged paranasal sinuses and mastoid air cells are clear.  IMPRESSION:  1. No acute abnormality. 2. Remote left ACA territory infarct and chronic microvascular ischemic change.   Original Report Authenticated By: Holley Dexter, M.D.         No results found. No diagnosis found.  MDM  Exam as above, NAD, vitals unremarkable, dysarthria, 4/5 strength LUE and LLE, mild left facial droop, remainder of neuro exam unremarkable. Hx of CVA w/ right sided deficit and most recent CVA w/ left sided deficit - unsure if worse than baseline. Family notes dysarthria worse - CT obtained, NAICA. D/w neurology and MR obtained which reveals spread of known internal capsule. Glucose normal, labs otherwise unremarkable. Out of tpa window. No code stroke initiated. Admitted to  medicine service for further care. Protecting airway. Given migraine cocktail for HA. Admit in stable condition.   I have personally reviewed labs and imaging and considered in my MDM. Case d/w Dr Laneta Simmers, MD 09/22/12 [phone]

## 2012-09-21 NOTE — ED Notes (Signed)
PT. REPORTS HEADACHE WITH GENERALIZED BODY ACHES / BILATERAL EYE PAIN FOR SEVERAL DAYS , DENIES FEVER OR CHILLS , ALERT AND ORIENTED /SPEECH CLEAR / NO FACIAL SYMMETRY , EQUAL STRONG GRIPS.

## 2012-09-21 NOTE — ED Notes (Signed)
Patient transported to MRI 

## 2012-09-21 NOTE — ED Notes (Signed)
CBG 81. Pt given a coke to drink.

## 2012-09-21 NOTE — ED Notes (Signed)
Pt reports sudden onset occipital headache two days ago. States that headache feels like a "tightening vice grip". Pt reports history of previous strokes last being in June. 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 Pt was supposed to go to rehab post discharge in June but was unable to get rides. Pt is to resume rehab July 8th. Pt now complaining of left sided weakness. Left side is weaker than right. Pt has slight left side facial droop, as well.

## 2012-09-22 ENCOUNTER — Encounter (HOSPITAL_COMMUNITY): Payer: Self-pay

## 2012-09-22 ENCOUNTER — Inpatient Hospital Stay (HOSPITAL_COMMUNITY)
Admission: RE | Admit: 2012-09-22 | Discharge: 2012-10-05 | DRG: 945 | Disposition: A | Discharge status: Home Health | Payer: Medicare Other | Source: Intra-hospital | Attending: Physical Medicine & Rehabilitation | Admitting: Physical Medicine & Rehabilitation

## 2012-09-22 ENCOUNTER — Inpatient Hospital Stay (HOSPITAL_COMMUNITY): Payer: Medicare Other

## 2012-09-22 DIAGNOSIS — M25539 Pain in unspecified wrist: Secondary | ICD-10-CM

## 2012-09-22 DIAGNOSIS — R29810 Facial weakness: Secondary | ICD-10-CM

## 2012-09-22 DIAGNOSIS — F3289 Other specified depressive episodes: Secondary | ICD-10-CM

## 2012-09-22 DIAGNOSIS — E785 Hyperlipidemia, unspecified: Secondary | ICD-10-CM

## 2012-09-22 DIAGNOSIS — Z5189 Encounter for other specified aftercare: Principal | ICD-10-CM

## 2012-09-22 DIAGNOSIS — F411 Generalized anxiety disorder: Secondary | ICD-10-CM

## 2012-09-22 DIAGNOSIS — M216X9 Other acquired deformities of unspecified foot: Secondary | ICD-10-CM

## 2012-09-22 DIAGNOSIS — M79601 Pain in right arm: Secondary | ICD-10-CM

## 2012-09-22 DIAGNOSIS — K5900 Constipation, unspecified: Secondary | ICD-10-CM

## 2012-09-22 DIAGNOSIS — I6330 Cerebral infarction due to thrombosis of unspecified cerebral artery: Secondary | ICD-10-CM

## 2012-09-22 DIAGNOSIS — I10 Essential (primary) hypertension: Secondary | ICD-10-CM

## 2012-09-22 DIAGNOSIS — E1142 Type 2 diabetes mellitus with diabetic polyneuropathy: Secondary | ICD-10-CM

## 2012-09-22 DIAGNOSIS — I639 Cerebral infarction, unspecified: Secondary | ICD-10-CM

## 2012-09-22 DIAGNOSIS — Z87891 Personal history of nicotine dependence: Secondary | ICD-10-CM

## 2012-09-22 DIAGNOSIS — E1149 Type 2 diabetes mellitus with other diabetic neurological complication: Secondary | ICD-10-CM

## 2012-09-22 DIAGNOSIS — G8110 Spastic hemiplegia affecting unspecified side: Secondary | ICD-10-CM

## 2012-09-22 DIAGNOSIS — Z7982 Long term (current) use of aspirin: Secondary | ICD-10-CM

## 2012-09-22 DIAGNOSIS — G4700 Insomnia, unspecified: Secondary | ICD-10-CM

## 2012-09-22 DIAGNOSIS — F329 Major depressive disorder, single episode, unspecified: Secondary | ICD-10-CM

## 2012-09-22 DIAGNOSIS — I6350 Cerebral infarction due to unspecified occlusion or stenosis of unspecified cerebral artery: Principal | ICD-10-CM

## 2012-09-22 DIAGNOSIS — Z823 Family history of stroke: Secondary | ICD-10-CM

## 2012-09-22 DIAGNOSIS — Z79899 Other long term (current) drug therapy: Secondary | ICD-10-CM

## 2012-09-22 DIAGNOSIS — Z602 Problems related to living alone: Secondary | ICD-10-CM

## 2012-09-22 DIAGNOSIS — I69998 Other sequelae following unspecified cerebrovascular disease: Secondary | ICD-10-CM

## 2012-09-22 DIAGNOSIS — R29898 Other symptoms and signs involving the musculoskeletal system: Secondary | ICD-10-CM

## 2012-09-22 LAB — GLUCOSE, CAPILLARY
Glucose-Capillary: 83 mg/dL (ref 70–99)
Glucose-Capillary: 141 mg/dL — ABNORMAL HIGH (ref 70–99)
Glucose-Capillary: 113 mg/dL — ABNORMAL HIGH (ref 70–99)

## 2012-09-22 LAB — CBC
Hemoglobin: 11.7 g/dL — ABNORMAL LOW (ref 12.0–15.0)
MCH: 26.4 pg (ref 26.0–34.0)
RBC: 4.43 MIL/uL (ref 3.87–5.11)
WBC: 7.8 10*3/uL (ref 4.0–10.5)

## 2012-09-22 LAB — CREATININE, SERUM
Creatinine, Ser: 1.07 mg/dL (ref 0.50–1.10)
GFR calc Af Amer: 64 mL/min — ABNORMAL LOW (ref >90)
GFR calc non Af Amer: 55 mL/min — ABNORMAL LOW (ref >90)

## 2012-09-22 MED ORDER — ADULT MULTIVITAMIN W/MINERALS CH
1.0000 | ORAL_TABLET | Freq: Every day | ORAL | Status: DC
Start: 2012-09-22 — End: 2012-09-22
  Administered 2012-09-22: 1 via ORAL
  Filled 2012-09-22: qty 1

## 2012-09-22 MED ORDER — ONDANSETRON HCL 4 MG/2ML IJ SOLN: 4.0000 mg | Freq: Four times a day (QID) | INTRAMUSCULAR | Status: DC | PRN

## 2012-09-22 MED ORDER — SENNOSIDES-DOCUSATE SODIUM 8.6-50 MG PO TABS
1.0000 | ORAL_TABLET | Freq: Every evening | ORAL | Status: DC | PRN
  Administered 2012-10-03: 1 via ORAL
  Filled 2012-09-22: qty 1

## 2012-09-22 MED ORDER — ENOXAPARIN SODIUM 40 MG/0.4ML ~~LOC~~ SOLN
40.0000 mg | Freq: Every day | SUBCUTANEOUS | Status: DC
  Administered 2012-09-22: 40 mg via SUBCUTANEOUS
  Filled 2012-09-22: qty 0.4

## 2012-09-22 MED ORDER — INSULIN ASPART 100 UNIT/ML ~~LOC~~ SOLN: 0.0000 [IU] | SUBCUTANEOUS | Status: DC

## 2012-09-22 MED ORDER — INSULIN ASPART 100 UNIT/ML ~~LOC~~ SOLN
0.0000 [IU] | Freq: Three times a day (TID) | SUBCUTANEOUS | Status: DC
  Administered 2012-09-26 – 2012-10-03 (×3): 1 [IU] via SUBCUTANEOUS

## 2012-09-22 MED ORDER — ASPIRIN 300 MG RE SUPP
300.0000 mg | Freq: Every day | RECTAL | Status: DC
  Filled 2012-09-22 (×14): qty 1

## 2012-09-22 MED ORDER — CLONAZEPAM 0.5 MG PO TABS
0.5000 mg | ORAL_TABLET | Freq: Two times a day (BID) | ORAL | Status: DC | PRN
  Administered 2012-09-22: 0.5 mg via ORAL
  Filled 2012-09-22: qty 1

## 2012-09-22 MED ORDER — ENSURE COMPLETE PO LIQD: 237.0000 mL | Freq: Two times a day (BID) | ORAL | Status: DC

## 2012-09-22 MED ORDER — CITALOPRAM HYDROBROMIDE 10 MG PO TABS
10.0000 mg | ORAL_TABLET | Freq: Every day | ORAL | Status: DC
  Administered 2012-09-22: 10 mg via ORAL
  Filled 2012-09-22: qty 1

## 2012-09-22 MED ORDER — CLONAZEPAM 0.5 MG PO TABS: 0.5000 mg | ORAL_TABLET | Freq: Two times a day (BID) | ORAL | Status: DC | PRN

## 2012-09-22 MED ORDER — ASPIRIN 325 MG PO TABS
325.0000 mg | ORAL_TABLET | Freq: Every day | ORAL | Status: DC
  Administered 2012-09-23 – 2012-10-05 (×13): 325 mg via ORAL
  Filled 2012-09-22 (×14): qty 1

## 2012-09-22 MED ORDER — ACETAMINOPHEN 325 MG PO TABS: 325.0000 mg | ORAL_TABLET | ORAL | Status: DC | PRN

## 2012-09-22 MED ORDER — ATORVASTATIN CALCIUM 40 MG PO TABS
40.0000 mg | ORAL_TABLET | Freq: Every day | ORAL | Status: DC
  Administered 2012-09-23 – 2012-10-04 (×12): 40 mg via ORAL
  Filled 2012-09-22 (×13): qty 1

## 2012-09-22 MED ORDER — ONDANSETRON HCL 4 MG PO TABS: 4.0000 mg | ORAL_TABLET | Freq: Four times a day (QID) | ORAL | Status: DC | PRN

## 2012-09-22 MED ORDER — ASPIRIN 325 MG PO TABS
325.0000 mg | ORAL_TABLET | Freq: Every day | ORAL | Status: DC
Start: 2012-09-22 — End: 2012-09-22
  Administered 2012-09-22: 325 mg via ORAL
  Filled 2012-09-22: qty 1

## 2012-09-22 MED ORDER — HYDROMORPHONE HCL PF 1 MG/ML IJ SOLN: 0.5000 mg | INTRAMUSCULAR | Status: DC | PRN

## 2012-09-22 MED ORDER — ENOXAPARIN SODIUM 40 MG/0.4ML ~~LOC~~ SOLN: 40.0000 mg | SUBCUTANEOUS | Status: DC

## 2012-09-22 MED ORDER — ADULT MULTIVITAMIN W/MINERALS CH
1.0000 | ORAL_TABLET | Freq: Every day | ORAL | Status: DC
  Administered 2012-09-23 – 2012-10-05 (×13): 1 via ORAL
  Filled 2012-09-22 (×15): qty 1

## 2012-09-22 MED ORDER — INSULIN ASPART 100 UNIT/ML ~~LOC~~ SOLN
0.0000 [IU] | Freq: Three times a day (TID) | SUBCUTANEOUS | Status: DC
  Administered 2012-09-22: 1 [IU] via SUBCUTANEOUS

## 2012-09-22 MED ORDER — SENNOSIDES-DOCUSATE SODIUM 8.6-50 MG PO TABS: 1.0000 | ORAL_TABLET | Freq: Every evening | ORAL | Status: DC | PRN

## 2012-09-22 MED ORDER — ATORVASTATIN CALCIUM 40 MG PO TABS
40.0000 mg | ORAL_TABLET | Freq: Every day | ORAL | Status: DC
  Administered 2012-09-22: 40 mg via ORAL
  Filled 2012-09-22: qty 1

## 2012-09-22 MED ORDER — SORBITOL 70 % SOLN
30.0000 mL | Freq: Every day | Status: DC | PRN
  Administered 2012-09-30: 30 mL via ORAL
  Filled 2012-09-22 (×2): qty 30

## 2012-09-22 MED ORDER — CITALOPRAM HYDROBROMIDE 10 MG PO TABS
10.0000 mg | ORAL_TABLET | Freq: Every day | ORAL | Status: DC
  Administered 2012-09-23 – 2012-10-05 (×13): 10 mg via ORAL
  Filled 2012-09-22 (×14): qty 1

## 2012-09-22 MED ORDER — ASPIRIN 300 MG RE SUPP
300.0000 mg | Freq: Every day | RECTAL | Status: DC
  Filled 2012-09-22: qty 1

## 2012-09-22 MED ORDER — ASPIRIN-DIPYRIDAMOLE ER 25-200 MG PO CP12: 1.0000 | ORAL_CAPSULE | Freq: Two times a day (BID) | ORAL | Status: DC

## 2012-09-22 MED ORDER — ENOXAPARIN SODIUM 40 MG/0.4ML ~~LOC~~ SOLN
40.0000 mg | SUBCUTANEOUS | Status: DC
  Administered 2012-09-23 – 2012-10-04 (×12): 40 mg via SUBCUTANEOUS
  Filled 2012-09-22 (×13): qty 0.4

## 2012-09-22 NOTE — Progress Notes (Signed)
Patient received from 4N.  Patient alert and oriented x4.  Patient oriented to room and unit.  Vitals stable.  Will continue to monitor.

## 2012-09-22 NOTE — PMR Pre-admission (Signed)
PMR Admission Coordinator Pre-Admission Assessment  Patient: Sophia Rodriguez is an 62 y.o., female MRN: [id number] DOB: 1950/04/15 Height: 5\' 5"  (165.1 cm) Weight: 74.798 kg (164 lb 14.4 oz)              Insurance Information HMO:     PPO:      PCP:      IPA:      80/20: yes     OTHER: no HMO PRIMARY: Medicare A and B      Policy#: [id number]      Subscriber: pt Benefits:  Phone #: [email or web address]     Name: 09/22/12 Eff. 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 right handed female with history of hypertension as well as diabetes mellitus with peripheral neuropathy. Patient history multi-CVA with right-sided weakness residual and recent admission inpatient rehabilitation services discharged 09/10/2012 on Aggrenox therapy for CVA prophylaxis. Patient was using a walker at home living with her daughter. Admitted 09/22/2012 with severe headache and increasing left-sided weakness. MRI of the brain showed acute ischemic infarct posterior limb of the right internal capsule as well as stable appearance of additional remote infarcts. Patient did not receive TPA. Neurology service followup presently on aspirin therapy with the addition of subcutaneous Lovenox for DVT prophylaxis and full workup ongoing. Patient maintained on a regular consistency  diet.   Total: 7 NIH  Past Medical History  Past Medical History  Diagnosis Date  . Stroke   . Hypertension   . Diabetes mellitus     Family History  family history includes Stroke in her brother.  Prior Rehab/Hospitalizations: CIR 08/30/12-09/10/12; ALF/SNF 3 years ago after initial CVA 3 years ago at rehab on [address], [place]  Current Medications  Current facility-administered medications:aspirin suppository 300 mg, 300 mg, Rectal, Daily, Ron Parker, MD;  aspirin tablet 325 mg, 325 mg, Oral, Daily, Ron Parker, MD, 325 mg at 09/22/12 1035;  atorvastatin (LIPITOR) tablet 40 mg, 40 mg, Oral, q1800, Ron Parker, MD;  citalopram (CELEXA) tablet 10 mg, 10 mg, Oral, Daily, Ron Parker, MD, 10 mg at 09/22/12 1035 clonazePAM (KLONOPIN) tablet 0.5 mg, 0.5 mg, Oral, BID PRN, Ron Parker, MD;  enoxaparin (LOVENOX) injection 40 mg, 40 mg, Subcutaneous, Daily, Ron Parker, MD, 40 mg at 09/22/12 1035;  feeding supplement (ENSURE COMPLETE) liquid 237 mL, 237 mL, Oral, BID BM, Tonye Becket, RD;  HYDROmorphone (DILAUDID) injection 0.5 mg, 0.5 mg, Intravenous, Q3H PRN, Ron Parker, MD insulin aspart (novoLOG) injection 0-9 Units, 0-9 Units, Subcutaneous, TID WC, Leda Gauze, NP, 1 Units at 09/22/12 0703;  multivitamin with minerals tablet 1 tablet, 1  tablet, Oral, Daily, Ron Parker, MD, 1 tablet at 09/22/12 1035;  senna-docusate (Senokot-S) tablet 1 tablet, 1 tablet, Oral, QHS PRN, Ron Parker, MD  Patients Current Diet: Carb Control  Precautions / Restrictions Precautions Precautions: Fall Precaution Comments: pt went home from CIR and does not note falls but did note feeling uneasy at home Other Brace/Splint: right AFO Restrictions Weight Bearing Restrictions: No   Prior Activity Level Household: limited to home bound since last admission Daughter was out of town for the past week and pt states functionally she declined  during that time. No falls. Patient missed her OP therpay during that time due to transportation not available.  Home Assistive Devices / Equipment Home Assistive Devices/Equipment: Dan Humphreys (specify type);Wheelchair;Shower chair with back Home Equipment: Walker - 2 wheels;Cane - single point;Wheelchair - manual  Prior Functional Level Prior Function Level of Independence: Independent with assistive device(s) ADL's / Homemaking Assistance Needed: daughter brought food a couple of days a week Comments: discharged at Mod I level 6/20 from CIR. Daughter stated unable to cook and maintain home which daughter provided intermittent assist 2 to 3 hrs per day  Current Functional Level Cognition  Overall Cognitive Status: Impaired/Different from baseline Difficult to assess due to:  (flat affect and slow processing) Current Attention Level: Sustained Orientation Level: Oriented X4 Following Commands: Follows one step commands consistently General Comments: Pt has delayed response to questions. Pt says that she is right handed, but used left hand more during session. Pt able to remember where things were in her room (direct PT to where her AFO was and how to use it). Problem solving required tactile and vc's.    Extremity Assessment (includes Sensation/Coordination)  Upper Extremity Assessment: LUE deficits/detail LUE Deficits / Details: Pt unable to fully extend at elbow. Also unable to hold arm up aganist pressure. Grip strength is decr, but strong enough to hold onto RW.  Lower Extremity Assessment: Defer to PT evaluation RLE Deficits / Details: decreased active df right LE, hip and knee 3+/5 on MMT but right knee buckles with wt-bearing when not using AFO.  RLE Sensation: decreased proprioception RLE Coordination: decreased gross motor LLE Deficits / Details: left knee ext 2/5 with MMT but when knee fully extended, able to hold it out straight maintaining quad contraction and able to wt-bear in  left without knee buckling, motor planning seems to be larger deficit than true strength loss on this side.  LLE Sensation: decreased proprioception LLE Coordination: decreased gross motor    ADLs  Eating/Feeding: Independent Where Assessed - Eating/Feeding: Chair Grooming: Wash/dry hands;Set up;Performed Where Assessed - Grooming: Unsupported sitting Upper Body Bathing: Supervision/safety Where Assessed - Upper Body Bathing: Unsupported sitting Lower Body Bathing: +1 Total assistance Where Assessed - Lower Body Bathing: Unsupported sitting Upper Body Dressing: Set up Where Assessed - Upper Body Dressing: Unsupported sitting Lower Body Dressing: +1 Total assistance Where Assessed - Lower Body Dressing: Unsupported sitting Toilet Transfer: Maximal assistance Toilet Transfer Method: Sit to stand Toilet Transfer Equipment: Raised toilet seat with arms (or 3-in-1 over toilet) Toileting - Clothing Manipulation and Hygiene: Minimal assistance Where Assessed - Glass blower/designer Manipulation and Hygiene: Sit to stand from 3-in-1 or toilet Tub/Shower Transfer: +1 Total assistance Equipment Used: Gait belt;Rolling walker Transfers/Ambulation Related to ADLs: Pt was min guard for transfers sit <> stand, and mod assist for ambulation. Pt needed assistance with advancing the RLE, and max vc's for safety. ADL Comments: Pt at [place]ard level for donning underwear with sit to  stand transfer. Pt performed toileting and at Min A level for hygiene/clothing.     Mobility  Bed Mobility: Supine to Sit;Sitting - Scoot to Edge of Bed Supine to Sit: 6: Modified independent (Device/Increase time) Sitting - Scoot to Edge of Bed: 5: Supervision Sit to Supine: 6: Modified independent (Device/Increase time)    Transfers  Transfers: Sit to Stand;Stand to Sit Sit to Stand: 4: Min guard Stand to Sit: 4: Min guard Stand Pivot Transfers: 1: +2 Total assist Stand Pivot Transfers: Patient Percentage: 60%     Ambulation / Gait / Stairs / Psychologist, prison and probation services  Ambulation/Gait Ambulation/Gait Assistance: 3: Mod assist Ambulation Distance (Feet): 15 Feet Assistive device: Rolling walker Ambulation/Gait Assistance Details: unable to ambulate without AFO. With AFO, pt taking a shuffling, very short and ineffective step with the RLE. Manual facilitation needed for effective step length as pt continues to take normal step length with LLE with RLE getting further and further behind. Could not compensate with increased hip hike right side either. Assist to wt-shift to left and assist with right hip flexion Gait Pattern: Decreased step length - right;Decreased weight shift to left;Decreased dorsiflexion - right;Decreased hip/knee flexion - left Gait velocity: very slow Stairs: No Wheelchair Mobility Wheelchair Mobility: No    Posture / Balance Static Sitting Balance Static Sitting - Balance Support: Feet supported;No upper extremity supported Static Sitting - Level of Assistance: 5: Stand by assistance Static Standing Balance Static Standing - Balance Support: Bilateral upper extremity supported;During functional activity Static Standing - Level of Assistance: 5: Stand by assistance Dynamic Standing Balance Dynamic Standing - Balance Support: Bilateral upper extremity supported;During functional activity Dynamic Standing - Level of Assistance: 5: Stand by assistance Dynamic Standing - Comments: balance does not seem to have declined with this new event    Special needs/care consideration Bowel mgmt: continent Bladder mgmt: continent    Previous Home Environment Living Arrangements: Alone  Lives With: Alone Available Help at Discharge: Available PRN/intermittently;Family;Other (Comment) (daughter available 2 to 3 hrs daily. She works two jobs) Type of Home: Apartment Home Layout: One level Home Access: Level entry Bathroom Shower/Tub: Engineer, manufacturing systems: Handicapped height Bathroom  Accessibility: Yes How Accessible: Accessible via walker Home Care Services: Other (Comment) (OP therapy had been arranged. They request HH ) Additional Comments: pt's daughter works. She left CIR able to get around her apt without an AD Disabled Child psychotherapist  Discharge Living Setting Plans for Discharge Living Setting: Patient's home;Apartment Does the patient have any problems obtaining your medications?: No  Social/Family/Support Systems Contact Information: Claudine Mouton, daughter Anticipated Caregiver's Contact Information: cell [phone] Ability/Limitations of Caregiver: daughter prn 2 to 3 hrs per day. If 24/7 Supervision; will need ALF or SNF Discharge Plan Discussed with Primary Caregiver: Yes Is Caregiver In Agreement with Plan?: Yes Does Caregiver/Family have Issues with Lodging/Transportation while Pt is in Rehab?: No  Goals/Additional Needs Patient/Family Goal for Rehab: supervision PT, OT, and SLP but hopeful for Mod I as before 09/10/12 Expected length of stay: ELOS 10 to 12 days Pt/Family Agrees to Admission and willing to participate: Yes Program Orientation Provided & Reviewed with Pt/Caregiver Including Roles  & Responsibilities: Yes   Decrease burden of Care through IP rehab admission: n/a  Possible need for SNF placement upon discharge:Patient and daughter aware that if pt does not reach Mod I level as before, she will need SNF or ALF as she did 3 years ago after first CVA. SNF/ALF on [address]. Daughter requests that if pt  d/c home that [facility] , not OP therapy arranged.   Patient Condition: This patient's condition remains as documented in the consult dated 09/22/12, in which the Rehabilitation Physician determined and documented that the patient's condition is appropriate for intensive rehabilitative care in an inpatient rehabilitation facility. Will admit to inpatient rehab today.  Preadmission Screen Completed By:  [name], 09/22/2012 3:05  PM ______________________________________________________________________   Discussed status with Dr. Riley Kill on 09/22/12 at  1504 and received telephone approval for admission today.  Admission Coordinator:  [name], time 1610 Date 09/22/12.

## 2012-09-22 NOTE — Plan of Care (Addendum)
Overall Plan of Care [facility]) Patient Details Name: Sophia Rodriguez MRN: [id number] DOB: October 04, 1950  Diagnosis:  Recurrent CVA with left hemiparesis  Co-morbidities: History of right hemiparesis secondary to prior CVA, diabetes with neuropathy  Functional Problem List  Patient demonstrates impairments in the following areas: Balance, Bladder, Bowel, Cognition, Endurance, Motor, Pain, Safety, Sensory , Skin Integrity, Vision and slow to process  Basic ADL's: grooming, bathing, dressing and toileting Advanced ADL's: simple meal preparation and light housekeeping  Transfers:  bed mobility, bed to chair, toilet, tub/shower, car and furniture Locomotion:  ambulation and stairs  Additional Impairments:  Functional use of upper extremity  Anticipated Outcomes Item Anticipated Outcome  Eating/Swallowing  N/A  Basic self-care  Mod I  Tolieting  Mod I  Bowel/Bladder   Min-assist  Transfers  Mod I except for car with supervision  Locomotion  Mod I ambulation x 50' ; up/down 5 steps with supervision  Communication    Cognition    Pain   pain <3  Safety/Judgment    Other     Therapy Plan: PT Intensity: Minimum of 1-2 x/day ,45 to 90 minutes PT Frequency: 5 out of 7 days PT Duration Estimated Length of Stay: 10-12 days  OT Intensity: Minimum of 1-2 x/day, 45 to 90 minutes OT Frequency: 5 out of 7 days OT Duration/Estimated Length of Stay: 10-12 days      Team Interventions: Item RN PT OT SLP SW TR Other  Self Care/Advanced ADL Retraining   x      Neuromuscular Re-Education  x x      Therapeutic Activities  x x   x   UE/LE Strength Training/ROM  x x   x   UE/LE Coordination Activities  x x   x   Visual/Perceptual Remediation/Compensation         DME/Adaptive Equipment Instruction  x x   x   Therapeutic Exercise  x x   x   Balance/Vestibular Training  x x   x   Patient/Family Education X x x   x   Cognitive Remediation/Compensation  x x   x   Functional Mobility  Training  x x   x   Ambulation/Gait Training  x       Stair Training  x       Wheelchair Propulsion/Positioning  x       Functional Statistician  x       Community Reintegration   x   x   Dysphagia/Aspiration Film/video editor         Bladder Management X        Bowel Management X        Disease Management/Prevention X        Pain Management X        Medication Management X        Skin Care/Wound Management         Splinting/Orthotics  x       Discharge Planning  x x   x   Psychosocial Support  x x   x                      Team Discharge Planning: Destination: PT-Home ,OT- Home , SLP- (defer OT/PT) Projected Follow-up: PT-HHPT, vs OPPT, OT-  Home health OT, SLP-None Projected Equipment Needs: PT-None recommended by PT, OT- Tub/shower bench, SLP-None recommended by SLP Patient/family involved in discharge planning: PT- Patient,  OT-Patient, SLP-Patient  MD ELOS: 10-12 days Medical Rehab Prognosis:  Good Assessment: 62 year old female with recent right subcortical CVA, head extension of previous infarcts. Now requiring 24/7 Rehab RN,MD, as well as CIR level PT, OT and SLP.  Treatment team will focus on ADLs, cognition and mobility with goals set at Mod I   See Team Conference Notes for weekly updates to the plan of care

## 2012-09-22 NOTE — Discharge Summary (Signed)
Physician Discharge Summary  Sophia Rodriguez WUJ:[id number] DOB: 04-01-1950 DOA: 09/21/2012  PCP: Dorrene German, MD  Admit date: 09/21/2012 Discharge date: 09/22/2012  Time spent: 25 minutes  Recommendations for Outpatient Follow-up:  1. Patient has been accepted and will be discharged to inpatient rehabilitation 2. As per stroke service recommendations, no changes in her medications  Discharge Diagnoses:  Principal Problem:   Stroke Active Problems:   Hypertension   DM type 2 causing neurological disease   Discharge Condition: Improved, being transferred back to inpatient rehabilitation  Diet recommendation: Heart healthy  Filed Weights   09/22/12 0215  Weight: 74.798 kg (164 lb 14.4 oz)    History of present illness:  On 7/1: Sophia Rodriguez is a 62 y.o. female with a history of a previous CVA last month s with residual Left hemiparesis along with history of HTN, and DM2 who presents to the ED with complaints of 2 days of severe headache and left sided weakness. She reports having difficulty walking as well due to the weakness. She denies having any chest pain. In the ED, she was evaluated and had a CT scan of the head performed which revealed a subacute CVA of the posterior limb of the right internal capsule. She was not deemed to be a TPA candidate due to onset of symptoms 2 days ago. She was referred for further evaluation and medical admission.    Hospital Course:  Principal Problem:   Stroke: Patient had a recent stroke approximately 3-4 weeks ago. Neurology reviewed the patient's MRI and the suspicion is that this is an actual extension of her old stroke and not a new separate stroke. They're not recommending at this time further workup or change in current medications. Because of her recent echo and Dopplers, these are not repeated. Patient was also at inpatient rehabilitation for her last admission, and she can be evaluated and felt to be candidate to go back. She'll be  transferred there today. Active Problems:   Hypertension: Stable continue home meds   DM type 2 causing neurological disease: Stable continue home meds   Procedures:  None  Consultations:  Stroke service   Discharge Exam: Filed Vitals:   09/22/12 0441 09/22/12 0701 09/22/12 0845 09/22/12 0939  BP: 117/68 105/82  109/68  Pulse: 87 68 105 91  Temp: 97.5 F (36.4 C) 97.4 F (36.3 C)  97.5 F (36.4 C)  TempSrc: Oral Oral  Oral  Resp: 10 15  18   Height:      Weight:      SpO2: 100% 100%  100%    General: Alert and oriented x3, no acute distress Cardiovascular: Regular rate and rhythm, S1-S2 Respiratory: Clear to auscultation bilaterally Abdomen: Soft, nontender, nondistended, positive bowel sounds Extremities: No clubbing or cyanosis or edema. Nephrology: Cranial nerves intact. Some mild weakness 4 minus/5 left upper extremity in terms of flexion and extension, otherwise no overt other deficits  Discharge Instructions   Future Appointments Provider Department Dept Phone   09/27/2012 12:45 PM [facility] [place] [facility] [phone]   09/27/2012 1:00 PM [name], MD [facility] [phone]   09/28/2012 8:45 AM Timoteo Gaul, PT [facility] [phone]   09/28/2012 9:30 AM Trixie Deis [place] [facility] [phone]       Medication List         atorvastatin 40 MG tablet  Commonly known as:  LIPITOR  Take 1 tablet (40 mg total) by mouth  daily at 6 PM.     citalopram 10 MG tablet  Commonly known as:  CELEXA  Take 10 mg by mouth daily.     clonazePAM 0.5 MG tablet  Commonly known as:  KLONOPIN  Take 0.5 mg by mouth daily as needed for anxiety.     diclofenac sodium 1 % Gel  Commonly known as:  VOLTAREN  Apply 2 g topically 4 (four) times daily. To left wrist.     dipyridamole-aspirin 200-25 MG per 12 hr  capsule  Commonly known as:  AGGRENOX  Take 1 capsule by mouth 2 (two) times daily.     metFORMIN 500 MG tablet  Commonly known as:  GLUCOPHAGE  Take 0.5 tablets (250 mg total) by mouth 2 (two) times daily with a meal. RESUME AT HALF A PILL TWICE A DAY TILL FOLLOW UP WITH MD.     multivitamin with minerals Tabs  Take 1 tablet by mouth daily.     zolpidem 10 MG tablet  Commonly known as:  AMBIEN  Take 10 mg by mouth at bedtime as needed for sleep.       No Known Allergies     Follow-up Information   Follow up with AVBUERE,EDWIN A, MD In 1 month.   Contact information:   [address] [place] [address]       Follow up with Gates Rigg, MD In 3 months.   Contact information:   [address] [place] [address] [phone]        The results of significant diagnostics from this hospitalization (including imaging, microbiology, ancillary and laboratory) are listed below for reference.    Significant Diagnostic Studies: Dg Chest 2 View  09/22/2012  IMPRESSION: No acute disease.   Original Report Authenticated By: Holley Dexter, M.D.   Ct Head Wo Contrast  09/21/2012     IMPRESSION:  1.  No acute abnormality. 2.  Remote left ACA territory infarct and chronic microvascular ischemic change.   Original Report Authenticated By: Holley Dexter, M.D.     Mr Brain Wo Contrast  09/21/2012    IMPRESSION: 1.  Acute ischemic infarct involving the posterior limb of the right internal capsule, at or just adjacent to recently identified acute ischemic infarct seen in this region on MRI from 08/26/2012. 2.  Stable appearance of additional remote infarcts as detailed above.   Original Report Authenticated By: Rise Mu, M.D.     Labs: Basic Metabolic Panel:  Recent Labs Lab 09/21/12 2039  NA 139  K 4.4  CL 104  CO2 22  GLUCOSE 92  BUN 17  CREATININE 1.18*  CALCIUM 10.0   Liver Function Tests:  Recent Labs Lab 09/21/12 2039   AST 18  ALT 19  ALKPHOS 95  BILITOT 0.3  PROT 7.4  ALBUMIN 3.7   CBC:  Recent Labs Lab 09/21/12 2039  WBC 6.4  NEUTROABS 3.9  HGB 12.4  HCT 37.0  MCV 78.9  PLT 273   CBG:  Recent Labs Lab 09/21/12 2102 09/22/12 0658 09/22/12 1123  GLUCAP 83 141* 142*       Signed:  Zaiyah Sottile K  [facility] 09/22/2012, 2:00 PM

## 2012-09-22 NOTE — H&P (Signed)
[facility] History and Physical  Sophia Rodriguez RUE:[id number] DOB: 04/02/1950 DOA: 09/21/2012  Referring physician: EDP PCP: Dorrene German, MD  Specialists:   Chief Complaint:  Headache and worsening of left sided weakness  HPI: Sophia Rodriguez is a 62 y.o. female with a history of a previous CVAs with residual Left hemiparesis along with history of HTN, and DM2 who presents to the ED with complaints of 2 days of severe headache and left sided weakness.   She reports having difficulty walking as well due to the weakness. She denies having any chest pain.   In the ED, she was evaluated and had a CT scan of the head performed which revealed a subacute CVA of the posterior limb of the right internal capsule.  She was not deemed to be a TPA candidate due to onset of symptoms 2 days ago.  She was referred for further evaluation and medical admission.      Review of Systems: The patient denies anorexia, fever, chills, weight loss, vision loss, diplopia, dizziness, decreased hearing, rhinitis, hoarseness, chest pain, syncope, dyspnea on exertion, peripheral edema, cough, hemoptysis, abdominal pain, nausea, vomiting, diarrhea, constipation, hematemesis, melena, hematochezia, severe indigestion/heartburn, dysuria, hematuria, incontinence, suspicious skin lesions, transient blindness,  depression, unusual weight change, abnormal bleeding, enlarged lymph nodes, angioedema, and breast masses.    Past Medical History  Diagnosis Date  . Stroke   . Hypertension   . Diabetes mellitus     History reviewed. No pertinent past surgical history.  Prior to Admission medications   Medication Sig Start Date End Date Taking? Authorizing Provider  atorvastatin (LIPITOR) 40 MG tablet Take 1 tablet (40 mg total) by mouth daily at 6 PM. 08/30/12  Yes Sorin Luanne Bras, MD  citalopram (CELEXA) 10 MG tablet Take 10 mg by mouth daily.   Yes Historical Provider, MD  clonazePAM (KLONOPIN) 0.5 MG tablet Take 0.5 mg by  mouth daily as needed for anxiety.   Yes Historical Provider, MD  diclofenac sodium (VOLTAREN) 1 % GEL Apply 2 g topically 4 (four) times daily. To left wrist. 09/10/12  Yes Evlyn Kanner Love, PA-C  dipyridamole-aspirin (AGGRENOX) 200-25 MG per 12 hr capsule Take 1 capsule by mouth 2 (two) times daily. 09/10/12  Yes Evlyn Kanner Love, PA-C  metFORMIN (GLUCOPHAGE) 500 MG tablet Take 0.5 tablets (250 mg total) by mouth 2 (two) times daily with a meal. RESUME AT HALF A PILL TWICE A DAY TILL FOLLOW UP WITH MD. 09/10/12  Yes Evlyn Kanner Love, PA-C  Multiple Vitamin (MULTIVITAMIN WITH MINERALS) TABS Take 1 tablet by mouth daily. 09/10/12  Yes Evlyn Kanner Love, PA-C  zolpidem (AMBIEN) 10 MG tablet Take 10 mg by mouth at bedtime as needed for sleep.   Yes Historical Provider, MD    No Known Allergies  Social History:  reports that she quit smoking about 5 years ago. She does not have any smokeless tobacco history on file. She reports that she does not drink alcohol or use illicit drugs.     Family History  Problem Relation Age of Onset  . Stroke Brother     (be sure to complete)   Physical Exam:  GEN:  Pleasant well developed  62 y.o. African American female  examined  and in no acute distress; cooperative with exam Filed Vitals:   09/21/12 2045 09/21/12 2048 09/21/12 2100 09/21/12 2100  BP:      Pulse: 65  67 69  Temp:  98.7 F (37.1 C)    TempSrc:  Resp: 10  10 9   SpO2: 99%  99% 100%   Blood pressure 123/78, pulse 69, temperature 98.7 F (37.1 C), temperature source Oral, resp. rate 9, SpO2 100.00%. PSYCH: She is alert and oriented x4; does not appear anxious does not appear depressed; affect is normal HEENT: Normocephalic and Atraumatic, Mucous membranes pink; PERRLA; EOM intact; Fundi:  Benign;  No scleral icterus, Nares: Patent, Oropharynx: Clear, Fair Dentition, Neck:  FROM, no cervical lymphadenopathy nor thyromegaly or carotid bruit; no JVD; Breasts:: Not examined CHEST WALL: No  tenderness CHEST: Normal respiration, clear to auscultation bilaterally HEART: Regular rate and rhythm; no murmurs rubs or gallops BACK: No kyphosis or scoliosis; no CVA tenderness ABDOMEN: Positive Bowel Sounds, soft non-tender; no masses, no organomegaly. Rectal Exam: Not done EXTREMITIES: No cyanosis, clubbing or edema; no ulcerations. Genitalia: not examined PULSES: 2+ and symmetric SKIN: Normal hydration no rash or ulceration CNS:  Cranial nerves 2-12 grossly intact,  No facial droop, No pronator drift, + weakness of the LUE (4/5 strength), DTR's intact ,  Sensory intact,  Gait not assessed.   BAbinski's + in Left Foot.       Labs on Admission:  Basic Metabolic Panel:  Recent Labs Lab 09/21/12 2039  NA 139  K 4.4  CL 104  CO2 22  GLUCOSE 92  BUN 17  CREATININE 1.18*  CALCIUM 10.0   Liver Function Tests:  Recent Labs Lab 09/21/12 2039  AST 18  ALT 19  ALKPHOS 95  BILITOT 0.3  PROT 7.4  ALBUMIN 3.7   No results found for this basename: LIPASE, AMYLASE,  in the last 168 hours No results found for this basename: AMMONIA,  in the last 168 hours CBC:  Recent Labs Lab 09/21/12 2039  WBC 6.4  NEUTROABS 3.9  HGB 12.4  HCT 37.0  MCV 78.9  PLT 273   Cardiac Enzymes: No results found for this basename: CKTOTAL, CKMB, CKMBINDEX, TROPONINI,  in the last 168 hours  BNP (last 3 results) No results found for this basename: PROBNP,  in the last 8760 hours CBG: No results found for this basename: GLUCAP,  in the last 168 hours  Radiological Exams on Admission: Ct Head Wo Contrast  09/21/2012   *RADIOLOGY REPORT*  Clinical Data: Acute onset occipital headache 2 days ago.  History of prior infarct.  CT HEAD WITHOUT CONTRAST  Technique:  Contiguous axial images were obtained from the base of the skull through the vertex without contrast.  Comparison: Brain MRI 08/26/2012 and head CT scan 07/01/2012.  Findings: Remote left PCA territory infarct is identified.  No evidence  of acute infarction, hemorrhage, mass lesion, mass effect, midline shift or abnormal excessive fluid collection.  Chronic microvascular ischemic change is noted.  Imaged paranasal sinuses and mastoid air cells are clear.  IMPRESSION:  1.  No acute abnormality. 2.  Remote left ACA territory infarct and chronic microvascular ischemic change.   Original Report Authenticated By: Holley Dexter, M.D.   Mr Brain Wo Contrast  09/21/2012   *RADIOLOGY REPORT*  Clinical Data:  left-sided weakness  MRI HEAD WITHOUT CONTRAST  Technique:  Multiplanar, multiecho pulse sequences of the brain and surrounding structures were obtained according to standard protocol without intravenous contrast.  Comparison: Prior CT performed earlier on the same day as well as prior MRI from 08/26/2012.  Findings: Small focus of restricted diffusion is present at the junction of the right thalamus and posterior limb of the right internal capsule, in the region of previously identified  infarct seen on prior MRI from 08/26/2012. This finding most likely represents a new infarction in this region. There is subtle signal dropout on the corresponding ADC map.  Remote moderate to large size infarct at the medial aspect of the left frontal and parietal lobes and in the anterior communicating artery distribution with associated encephalomalacia is again seen. Secondary ex vacuo dilatation of the left lateral ventricle with Wallerian degeneration again noted.  Remote infarcts involving the left pons, and left globus pallidus are again noted.  No intracranial mass lesions or midline shift.  No extra-axial fluid collection.  IMPRESSION: 1.  Acute ischemic infarct involving the posterior limb of the right internal capsule, at or just adjacent to recently identified acute ischemic infarct seen in this region on MRI from 08/26/2012. 2.  Stable appearance of additional remote infarcts as detailed above.   Original Report Authenticated By: Rise Mu,  M.D.     EKG: Independently reviewed.    Assessment/Plan Principal Problem:   Stroke Active Problems:   Hypertension   DM type 2 causing neurological disease   1.   CVA- Ischemic CVA protocol ordered, MRI/MRA in AM on 07/02.  ASA PR while NPO, Swallow evaluation.  2D ECHO and Carotid US in AM .  Check Fasting lipids in AM.  PT, OT and speech Evaluations and Consult Neurology in AM.    2.   HTN- Monitor BPs, Continue , and Iv hydralazine PRN SBP > 160.    3.  DM2-  Hold Metformin, SSI coverage for Elevated Glucose levels.  Check HbA1C in AM on 07/02.       Code Status:  FULL CODE   Family Communication:  No Family present   Disposition Plan:   TBA    Time spent: 70 minutes  Ron Parker [facility] Pager [phone]  If 7PM-7AM, please contact night-coverage [email or web address] Password [facility] 09/22/2012, 12:54 AM

## 2012-09-22 NOTE — ED Provider Notes (Signed)
I saw and evaluated the patient, reviewed the resident's note and I agree with the findings and plan.  ECG at time 20:14 shows SR at rate 82, normal axis, normal intervals, no ST or T wave abns.  Pt with h/o multiple strokes, recently admitted for stroke affecting left side of body and speech a few weeks ago, reports woke with a HA 2 days ago, possibly worse weakness to left side.  Family reports initially speech was slurred again, now improved some, but still slowed and delayed.  PT also has had a prior stroke affecting right side of body years ago.  Pt is protecting airway, conversant, follows commands.  Weakness noted to left with delayed speech, poor memory.  Will get head CT, likely will need MRI to determine if another acute on chronic CVA has occurred.  IF MRI shows another stroke, will need Neuro consultation and admission.  Gavin Pound. Kyson Kupper, MD 09/22/12 1219

## 2012-09-22 NOTE — Progress Notes (Signed)
Rehab Admissions Coordinator Note:  Patient was screened by [name] for appropriateness for an Inpatient Acute Rehab Consult.  At this time, we are recommending Inpatient Rehab consult. Patient discharged from [facility] 6/20 at Mod I level. I will contact Dr. Rito Ehrlich.  [name] 09/22/2012, 9:33 AM  I can be reached at [phone].

## 2012-09-22 NOTE — Progress Notes (Signed)
INITIAL NUTRITION ASSESSMENT  DOCUMENTATION CODES Per approved criteria  -Not Applicable   INTERVENTION: 1. Ensure Complete po BID, each supplement provides 350 kcal and 13 grams of protein.   NUTRITION DIAGNOSIS: Inadequate oral intake related to decreased appetite  as evidenced by diet recall.   Goal: Po intake to meet >/=90% estimated nutrition needs  Monitor:  PO intake, weight trends, labs   Reason for Assessment: Malnutrition Screening Tool  62 y.o. female  Admitting Dx: Stroke  ASSESSMENT: Pt with hx of CVA, presented with increased weakness and headache. MRI showing positive diffusion, likely extension of previous infarct. No additional stroke work up ordered.  Pt states that over the past month she has not been eating well, decreased appetite and intake. Does Drink one Boost shake per day. Weight hx shows 3 lb weight loss in the past 2 weeks, not significant in given time frame.   Nutrition Focused Physical Exam:  Subcutaneous Fat:  Orbital Region: mild depletion Upper Arm Region: WNL Thoracic and Lumbar Region: WNL  Muscle:  Temple Region: mild depletion Clavicle Bone Region: mild depletion Clavicle and Acromion Bone Region: WNL Scapular Bone Region: WNL    Height: Ht Readings from Last 1 Encounters:  09/22/12 5\' 5"  (1.651 m)    Weight: Wt Readings from Last 1 Encounters:  09/22/12 164 lb 14.4 oz (74.798 kg)    Ideal Body Weight: 125 lbs   % Ideal Body Weight: 131%  Wt Readings from Last 10 Encounters:  09/22/12 164 lb 14.4 oz (74.798 kg)  09/10/12 167 lb 3.2 oz (75.841 kg)  08/26/12 165 lb (74.844 kg)    Usual Body Weight: 165 lbs   % Usual Body Weight: 100%  BMI:  Body mass index is 27.44 kg/(m^2). overweight  Estimated Nutritional Needs: Kcal: 1550-1750 Protein: 75-85 gm  Fluid: 1.6-1.8 L   Skin: intact   Diet Order: Carb Control  EDUCATION NEEDS: -No education needs identified at this time   Intake/Output Summary (Last 24  hours) at 09/22/12 1047 Last data filed at 09/22/12 0823  Gross per 24 hour  Intake    240 ml  Output      0 ml  Net    240 ml    Last BM: PTA    Labs:   Recent Labs Lab 09/21/12 2039  NA 139  K 4.4  CL 104  CO2 22  BUN 17  CREATININE 1.18*  CALCIUM 10.0  GLUCOSE 92    CBG (last 3)   Recent Labs  09/21/12 2102 09/22/12 0658  GLUCAP 83 141*   Lab Results  Component Value Date   HGBA1C 5.9* 08/27/2012    Scheduled Meds: . aspirin  300 mg Rectal Daily   Or  . aspirin  325 mg Oral Daily  . atorvastatin  40 mg Oral q1800  . citalopram  10 mg Oral Daily  . enoxaparin (LOVENOX) injection  40 mg Subcutaneous Daily  . insulin aspart  0-9 Units Subcutaneous TID WC  . multivitamin with minerals  1 tablet Oral Daily    Continuous Infusions:   Past Medical History  Diagnosis Date  . Stroke   . Hypertension   . Diabetes mellitus     History reviewed. No pertinent past surgical history.  Clarene Duke RD, LDN Pager [phone] After Hours pager [phone]

## 2012-09-22 NOTE — Progress Notes (Signed)
I agree with the following treatment note after reviewing documentation.   Johnston, Emberli Ballester Brynn   OTR/L Pager: [phone] Office: [phone] .   

## 2012-09-22 NOTE — H&P (Signed)
Physical Medicine and Rehabilitation Admission H&P    Chief Complaint  Patient presents with  . Headache  : HPI: Sophia Rodriguez is a 62 y.o. right handed female with history of hypertension as well as diabetes mellitus with peripheral neuropathy. Patient history multi-CVA with right-sided weakness residual and recent admission inpatient rehabilitation services discharged 09/10/2012 on Aggrenox therapy for CVA prophylaxis. Patient was using a walker at home living with her daughter. Outpatient therapy had been arranged the patient did not have the necessary transportation. Admitted 09/22/2012 with severe headache and increasing left-sided weakness. MRI of the brain showed acute ischemic infarct posterior limb of the right internal capsule versus extension of previous infarct as well as stable appearance of additional remote infarcts. Patient did not receive TPA. Neurology service followup presently on aspirin therapy with the addition of subcutaneous Lovenox for DVT prophylaxis and full workup ongoing. Patient maintained on a regular consistency diet. Physical and occupational therapy evaluations completed. M.D. Has requested physical medicine rehabilitation consult consider inpatient rehabilitation services. Patient felt to be a good candidate for inpatient rehabilitation services and was admitted for comprehensive rehabilitation program  Patient feels weaker on the left side.  Review of Systems  Gastrointestinal: Positive for constipation.  Musculoskeletal: Positive for myalgias.  Neurological: Positive for weakness. Headache Psychiatric/Behavioral: Positive for depression.  All other systems reviewed and are negative   Past Medical History  Diagnosis Date  . Stroke   . Hypertension   . Diabetes mellitus    History reviewed. No pertinent past surgical history. Family History  Problem Relation Age of Onset  . Stroke Brother    Social History:  reports that she quit smoking about 5 years  ago. She does not have any smokeless tobacco history on file. She reports that she does not drink alcohol or use illicit drugs. Allergies: No Known Allergies Medications Prior to Admission  Medication Sig Dispense Refill  . atorvastatin (LIPITOR) 40 MG tablet Take 1 tablet (40 mg total) by mouth daily at 6 PM.      . citalopram (CELEXA) 10 MG tablet Take 10 mg by mouth daily.      . clonazePAM (KLONOPIN) 0.5 MG tablet Take 0.5 mg by mouth daily as needed for anxiety.      . diclofenac sodium (VOLTAREN) 1 % GEL Apply 2 g topically 4 (four) times daily. To left wrist.  4 Tube  1  . dipyridamole-aspirin (AGGRENOX) 200-25 MG per 12 hr capsule Take 1 capsule by mouth 2 (two) times daily.  60 capsule  1  . metFORMIN (GLUCOPHAGE) 500 MG tablet Take 0.5 tablets (250 mg total) by mouth 2 (two) times daily with a meal. RESUME AT HALF A PILL TWICE A DAY TILL FOLLOW UP WITH MD.      . Multiple Vitamin (MULTIVITAMIN WITH MINERALS) TABS Take 1 tablet by mouth daily.      Marland Kitchen zolpidem (AMBIEN) 10 MG tablet Take 10 mg by mouth at bedtime as needed for sleep.        Home: Home Living Family/patient expects to be discharged to:: Private residence Living Arrangements: Alone Available Help at Discharge: Family;Available PRN/intermittently Type of Home: Apartment Home Access: Level entry Home Layout: One level Home Equipment: Walker - 2 wheels;Cane - single point;Wheelchair - manual Additional Comments: pt's daughter works. She left CIR able to get around her apt without an AD  Lives With: Alone   Functional History:    Functional Status:  Mobility: Bed Mobility Bed Mobility: Supine to Sit;Sitting - Scoot  to Edge of Bed Supine to Sit: 6: Modified independent (Device/Increase time) Sitting - Scoot to Edge of Bed: 5: Supervision Sit to Supine: 6: Modified independent (Device/Increase time) Transfers Transfers: Sit to Stand;Stand to Sit Sit to Stand: 4: Min guard Stand to Sit: 4: Min guard Stand Pivot  Transfers: 1: +2 Total assist Stand Pivot Transfers: Patient Percentage: 60% Ambulation/Gait Ambulation/Gait Assistance: 3: Mod assist Ambulation Distance (Feet): 15 Feet Assistive device: Rolling walker Ambulation/Gait Assistance Details: unable to ambulate without AFO. With AFO, pt taking a shuffling, very short and ineffective step with the RLE. Manual facilitation needed for effective step length as pt continues to take normal step length with LLE with RLE getting further and further behind. Could not compensate with increased hip hike right side either. Assist to wt-shift to left and assist with right hip flexion Gait Pattern: Decreased step length - right;Decreased weight shift to left;Decreased dorsiflexion - right;Decreased hip/knee flexion - left Gait velocity: very slow Stairs: No Wheelchair Mobility Wheelchair Mobility: No  ADL: ADL Eating/Feeding: Independent Where Assessed - Eating/Feeding: Chair Grooming: Wash/dry hands;Set up;Performed Where Assessed - Grooming: Unsupported sitting Upper Body Bathing: Supervision/safety Where Assessed - Upper Body Bathing: Unsupported sitting Lower Body Bathing: +1 Total assistance Where Assessed - Lower Body Bathing: Unsupported sitting Upper Body Dressing: Set up Where Assessed - Upper Body Dressing: Unsupported sitting Lower Body Dressing: +1 Total assistance Where Assessed - Lower Body Dressing: Unsupported sitting Toilet Transfer: Maximal assistance Toilet Transfer Method: Sit to stand Toilet Transfer Equipment: Raised toilet seat with arms (or 3-in-1 over toilet) Tub/Shower Transfer: +1 Total assistance Equipment Used: Gait belt;Rolling walker Transfers/Ambulation Related to ADLs: Pt was min guard for transfers sit <> stand, and mod assist for ambulation. Pt needed assistance with advancing the RLE, and max vc's for safety. ADL Comments: Pt at [place] level for donning underwear with sit to stand transfer. Pt performed toileting  and at Min A level for hygiene/clothing.   Cognition: Cognition Overall Cognitive Status: Impaired/Different from baseline Orientation Level: Oriented X4 Cognition Arousal/Alertness: Awake/alert Behavior During Therapy: Flat affect Overall Cognitive Status: Impaired/Different from baseline Area of Impairment: Problem solving;Following commands Current Attention Level: Sustained Following Commands: Follows one step commands consistently Problem Solving: Decreased initiation;Slow processing;Difficulty sequencing;Requires verbal cues;Requires tactile cues General Comments: Pt has delayed response to questions. Pt says that she is right handed, but used left hand more during session. Pt able to remember where things were in her room (direct PT to where her AFO was and how to use it). Problem solving required tactile and vc's. Difficult to assess due to:  (flat affect and slow processing)  Physical Exam: Blood pressure 109/68, pulse 91, temperature 97.5 F (36.4 C), temperature source Oral, resp. rate 18, height 5\' 5"  (1.651 m), weight 74.798 kg (164 lb 14.4 oz), SpO2 100.00%. General: . NAD Constitutional: She is oriented to person, place, and time.  HENT: oral mucosa pink and moist Head: Normocephalic.  Eyes: EOM are normal.  Neck: Normal range of motion. Neck supple. No thyromegaly present.  Cardiovascular: Normal rate and regular rhythm without murmur  Pulmonary/Chest: Effort normal and breath sounds normal. No respiratory distress. No wheezes Abdominal: Soft. Bowel sounds are normal. She exhibits no distension. Musculoskeletal: full ROM, no pain, swelling   Neurological: She is alert and oriented to person, place, and time.  Follow simple commands/ reasonable insight and awareness. CN notable for mild left facial weakness and tongue deviation 3- to 3/5 strength in the left deltoid, bicep, tricep, grip, hip  flexor, knee extensors,  0/5 ankle dorsiflexor and plantar flexor  Right  ankle dorsiflexor 0/5 right upper extremity 4/5 in the deltoid, bicep, tricep, grip  Right lower extremities 4/5 in the right hip flexor knee extensor  Sensation reduced in the left lower extremity and left upper extremity.  Skin: Skin is warm and dry.  Psychiatric: She has a normal mood and affect. Slightly flat    Results for orders placed during the hospital encounter of 09/21/12 (from the past 48 hour(s))  CBC WITH DIFFERENTIAL     Status: None   Collection Time    09/21/12  8:39 PM      Result Value Range   WBC 6.4  4.0 - 10.5 K/uL   RBC 4.69  3.87 - 5.11 MIL/uL   Hemoglobin 12.4  12.0 - 15.0 g/dL   HCT 16.1  09.6 - 04.5 %   MCV 78.9  78.0 - 100.0 fL   MCH 26.4  26.0 - 34.0 pg   MCHC 33.5  30.0 - 36.0 g/dL   RDW 40.9  81.1 - 91.4 %   Platelets 273  150 - 400 K/uL   Neutrophils Relative % 61  43 - 77 %   Neutro Abs 3.9  1.7 - 7.7 K/uL   Lymphocytes Relative 29  12 - 46 %   Lymphs Abs 1.9  0.7 - 4.0 K/uL   Monocytes Relative 8  3 - 12 %   Monocytes Absolute 0.5  0.1 - 1.0 K/uL   Eosinophils Relative 2  0 - 5 %   Eosinophils Absolute 0.1  0.0 - 0.7 K/uL   Basophils Relative 0  0 - 1 %   Basophils Absolute 0.0  0.0 - 0.1 K/uL  COMPREHENSIVE METABOLIC PANEL     Status: Abnormal   Collection Time    09/21/12  8:39 PM      Result Value Range   Sodium 139  135 - 145 mEq/L   Potassium 4.4  3.5 - 5.1 mEq/L   Chloride 104  96 - 112 mEq/L   CO2 22  19 - 32 mEq/L   Glucose, Bld 92  70 - 99 mg/dL   BUN 17  6 - 23 mg/dL   Creatinine, Ser 7.82 (*) 0.50 - 1.10 mg/dL   Calcium 95.6  8.4 - 21.3 mg/dL   Total Protein 7.4  6.0 - 8.3 g/dL   Albumin 3.7  3.5 - 5.2 g/dL   AST 18  0 - 37 U/L   ALT 19  0 - 35 U/L   Alkaline Phosphatase 95  39 - 117 U/L   Total Bilirubin 0.3  0.3 - 1.2 mg/dL   GFR calc non Af Amer 49 (*) >90 mL/min   GFR calc Af Amer 57 (*) >90 mL/min   Comment:            The eGFR has been calculated     using the CKD EPI equation.     This calculation has not been      validated in all clinical     situations.     eGFR's persistently     <90 mL/min signify     possible Chronic Kidney Disease.  GLUCOSE, CAPILLARY     Status: None   Collection Time    09/21/12  9:02 PM      Result Value Range   Glucose-Capillary 83  70 - 99 mg/dL   Comment 1 Notify RN     Comment 2 Documented  in Chart    GLUCOSE, CAPILLARY     Status: Abnormal   Collection Time    09/22/12  6:58 AM      Result Value Range   Glucose-Capillary 141 (*) 70 - 99 mg/dL   Comment 1 Notify RN    GLUCOSE, CAPILLARY     Status: Abnormal   Collection Time    09/22/12 11:23 AM      Result Value Range   Glucose-Capillary 142 (*) 70 - 99 mg/dL   Dg Chest 2 View  4/0/9811   *RADIOLOGY REPORT*  Clinical Data: Weakness.  Headache.  CHEST - 2 VIEW  Comparison: Plain film chest 03/08/2009.  Findings: Lungs are clear.  Heart size is normal.  No pneumothorax or pleural fluid.  IMPRESSION: No acute disease.   Original Report Authenticated By: Holley Dexter, M.D.   Ct Head Wo Contrast  09/21/2012   *RADIOLOGY REPORT*  Clinical Data: Acute onset occipital headache 2 days ago.  History of prior infarct.  CT HEAD WITHOUT CONTRAST  Technique:  Contiguous axial images were obtained from the base of the skull through the vertex without contrast.  Comparison: Brain MRI 08/26/2012 and head CT scan 07/01/2012.  Findings: Remote left PCA territory infarct is identified.  No evidence of acute infarction, hemorrhage, mass lesion, mass effect, midline shift or abnormal excessive fluid collection.  Chronic microvascular ischemic change is noted.  Imaged paranasal sinuses and mastoid air cells are clear.  IMPRESSION:  1.  No acute abnormality. 2.  Remote left ACA territory infarct and chronic microvascular ischemic change.   Original Report Authenticated By: Holley Dexter, M.D.   Mr Brain Wo Contrast  09/21/2012   *RADIOLOGY REPORT*  Clinical Data:  left-sided weakness  MRI HEAD WITHOUT CONTRAST  Technique:   Multiplanar, multiecho pulse sequences of the brain and surrounding structures were obtained according to standard protocol without intravenous contrast.  Comparison: Prior CT performed earlier on the same day as well as prior MRI from 08/26/2012.  Findings: Small focus of restricted diffusion is present at the junction of the right thalamus and posterior limb of the right internal capsule, in the region of previously identified infarct seen on prior MRI from 08/26/2012. This finding most likely represents a new infarction in this region. There is subtle signal dropout on the corresponding ADC map.  Remote moderate to large size infarct at the medial aspect of the left frontal and parietal lobes and in the anterior communicating artery distribution with associated encephalomalacia is again seen. Secondary ex vacuo dilatation of the left lateral ventricle with Wallerian degeneration again noted.  Remote infarcts involving the left pons, and left globus pallidus are again noted.  No intracranial mass lesions or midline shift.  No extra-axial fluid collection.  IMPRESSION: 1.  Acute ischemic infarct involving the posterior limb of the right internal capsule, at or just adjacent to recently identified acute ischemic infarct seen in this region on MRI from 08/26/2012. 2.  Stable appearance of additional remote infarcts as detailed above.   Original Report Authenticated By: Rise Mu, M.D.    Post Admission Physician Evaluation: 1. Functional deficits secondary  to thrombotic right internal capsule infarct. 2. Patient is admitted to receive collaborative, interdisciplinary care between the physiatrist, rehab nursing staff, and therapy team. 3. Patient's level of medical complexity and substantial therapy needs in context of that medical necessity cannot be provided at a lesser intensity of care such as a SNF. 4. Patient has experienced substantial functional loss from his/her baseline  which was documented  above under the "Functional History" and "Functional Status" headings.  Judging by the patient's diagnosis, physical exam, and functional history, the patient has potential for functional progress which will result in measurable gains while on inpatient rehab.  These gains will be of substantial and practical use upon discharge  in facilitating mobility and self-care at the household level. 5. Physiatrist will provide 24 hour management of medical needs as well as oversight of the therapy plan/treatment and provide guidance as appropriate regarding the interaction of the two. 6. 24 hour rehab nursing will assist with bladder management, bowel management, safety, skin/wound care, disease management, medication administration, pain management and patient education  and help integrate therapy concepts, techniques,education, etc. 7. PT will assess and treat for/with: Lower extremity strength, range of motion, stamina, balance, functional mobility, safety, adaptive techniques and equipment, NMR, orthotics, education.   Goals are: mod I. 8. OT will assess and treat for/with: ADL's, functional mobility, safety, upper extremity strength, adaptive techniques and equipment, NMR, orthotic use, education.   Goals are: mod I. 9. SLP will assess and treat for/with: cognition, communication  Goals are: mod I. 10. Case Management and Social Worker will assess and treat for psychological issues and discharge planning. 11. Team conference will be held weekly to assess progress toward goals and to determine barriers to discharge. 12. Patient will receive at least 3 hours of therapy per day at least 5 days per week. 13. ELOS: 7-10 days       14. Prognosis:  excellent   Medical Problem List and Plan: 1. 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 DVT Prophylaxis/Anticoagulation: Subcutaneous Lovenox. Monitor platelet counts any signs of bleeding 3. Pain Management:  Tylenol as  needed 4. Mood/depression. Celexa 10 mg daily, Klonopin 0.5 mg twice a day. Provide emotional support. May need further increase of celexa---follow clinically 5. Neuropsych: This patient is capable of making decisions on her own behalf. 6. Hypertension. Currently on no antihypertensive medications. Monitor as her activity increases.  7. Diabetes mellitus with peripheral neuropathy. Latest hemoglobin A1c of 5.9. . Patient on Glucophage 250 mg twice a day prior to admission. Check blood sugars a.c. and at bedtime. Adjust regimen as indicated 8. Hyperlipidemia. Lipitor           Ranelle Oyster, MD, [facility] Physical Medicine & Rehabilitation   09/22/2012

## 2012-09-22 NOTE — Interval H&P Note (Signed)
Sophia Rodriguez was admitted today to Inpatient Rehabilitation with the diagnosis of right posterior limb of internal capsule infarct.  The patient's history has been reviewed, patient examined, and there is no change in status.  Patient continues to be appropriate for intensive inpatient rehabilitation.  I have reviewed the patient's chart and labs.  Questions were answered to the patient's satisfaction.  Kamran Coker T 09/22/2012, 10:31 PM

## 2012-09-22 NOTE — Progress Notes (Signed)
PT/OT Note:  Noted that pt's PT/OT order start date was not until 09/23/12. Spoke with Sondra Come after evaluations letting him know that pt had gotten up to chair. MD agreeable to her being up. Safety zone to be completed.  Lyanne Co, PT  Acute Rehab Services  [phone]

## 2012-09-22 NOTE — Progress Notes (Signed)
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 Also with remote left pons and globus pallidus infarcts.   Clinical Impression   Pt is a 62 yo F who returns to [facility] with further LUE weakness, which is further impacting her ability to ambulate and perform ADL. She had just returned home from CIR and was performing at a modified independent level and getting around her apt without any DME, but she reported feeling uneasy at home. Pt would benefit from skilled OT services and would greatly benefit from CIR to incr independence and confidence in ADL and ambulation.    OT Assessment  Patient needs continued OT Services    Follow Up Recommendations  CIR    Barriers to Discharge Decreased caregiver support Lives alone. Daughter in [place], but works.  Equipment Recommendations  Other (comment) (TBD)    Recommendations for Other Services Rehab consult  Frequency  Min 2X/week    Precautions / Restrictions Precautions Precautions: Fall Precaution Comments: pt went home from CIR and does not note falls but did note feeling uneasy at home Required Braces or Orthoses: Other Brace/Splint Other Brace/Splint: right AFO Restrictions Weight Bearing Restrictions: No   Pertinent Vitals/Pain Pt reported no pain during session.    ADL  Eating/Feeding: Independent Where Assessed - Eating/Feeding: Chair Grooming: Wash/dry hands;Set up;Performed Where Assessed - Grooming: Unsupported sitting Upper Body Bathing: Supervision/safety Where Assessed - Upper Body Bathing: Unsupported sitting Lower Body Bathing: +1 Total assistance Where Assessed - Lower Body Bathing: Unsupported sitting Upper Body Dressing:  Set up Where Assessed - Upper Body Dressing: Unsupported sitting Lower Body Dressing: +1 Total assistance Where Assessed - Lower Body Dressing: Unsupported sitting Toilet Transfer: Maximal assistance Toilet Transfer Method: Sit to stand Toilet Transfer Equipment: Raised toilet seat with arms (or 3-in-1 over toilet) Toileting - Clothing Manipulation and Hygiene: Minimal assistance Where Assessed - Glass blower/designer Manipulation and Hygiene: Sit to stand from 3-in-1 or toilet Tub/Shower Transfer: +1 Total assistance Equipment Used: Gait belt;Rolling walker Transfers/Ambulation Related to ADLs: Pt was min guard for transfers sit <> stand, and mod assist for ambulation. Pt needed assistance with advancing the RLE, and max vc's for safety. ADL Comments: Pt at [place] level for donning underwear with sit to stand transfer. Pt performed toileting and at Min A level for hygiene/clothing.     OT Diagnosis: Cognitive deficits;Paresis;Generalized weakness  OT Problem List: Decreased strength;Decreased range of motion;Decreased cognition;Impaired UE functional use OT Treatment Interventions: Self-care/ADL training;DME and/or AE instruction;Therapeutic activities;Cognitive remediation/compensation;Patient/family education;Balance training;Neuromuscular education   OT Goals(Current goals can be found in the care plan section) Acute Rehab OT Goals Patient Stated Goal: unsure at this point OT Goal Formulation: With patient Time For Goal Achievement: 10/06/12 Potential to Achieve Goals: Good ADL Goals Pt Will Perform Grooming: with supervision;standing Pt Will Perform Upper Body Dressing: with modified independence;sitting Pt Will Perform Lower Body Dressing: with supervision;sit to/from stand  Visit Information  Last OT Received On: 09/22/12 Assistance Needed: +1 PT/OT Co-Evaluation/Treatment: Yes History of Present Illness: 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 Also with remote left pons and globus pallidus infarcts.       Prior Functioning  Home Living Family/patient expects to be discharged to:: Private residence Living Arrangements: Alone Available Help at Discharge: Family;Available PRN/intermittently Type of Home: Apartment Home Access: Level entry Home Layout: One level Home Equipment: Walker - 2 wheels;Cane - single point;Wheelchair - manual Additional Comments: pt's daughter works. She left CIR able to get around her apt without an AD  Lives With: Alone Prior Function Level of Independence: Needs assistance ADL's / Homemaking Assistance Needed: daughter brought food a couple of days a week Communication Communication: Other (comment);Expressive difficulties (delayed response) Dominant Hand: Right         Vision/Perception Vision - History Baseline Vision: Wears glasses for distance only Patient Visual Report: No change from baseline   Cognition  Cognition Arousal/Alertness: Awake/alert Behavior During Therapy: Flat affect Overall Cognitive Status: Impaired/Different from baseline Area of Impairment: Problem solving;Following commands Current Attention Level: Sustained Following Commands: Follows one step commands consistently Problem Solving: Decreased initiation;Slow processing;Difficulty sequencing;Requires verbal cues;Requires tactile cues General Comments: Pt has delayed response to questions. Pt says that she is right handed, but used left hand more during session. Pt able to remember where things were in her room (direct PT to where her AFO was and how to use it). Problem solving required tactile and vc's. Difficult to assess due to:  (flat affect and slow processing)    Extremity/Trunk Assessment Upper Extremity Assessment Upper Extremity Assessment: LUE deficits/detail LUE Deficits / Details: Pt unable to fully extend at elbow. Also unable to hold arm up aganist pressure. Grip  strength is decr, but strong enough to hold onto RW. Lower Extremity Assessment Lower Extremity Assessment: Defer to PT evaluation RLE Deficits / Details: decreased active df right LE, hip and knee 3+/5 on MMT but right knee buckles with wt-bearing when not using AFO.  RLE Sensation: decreased proprioception RLE Coordination: decreased gross motor LLE Deficits / Details: left knee ext 2/5 with MMT but when knee fully extended, able to hold it out straight maintaining quad contraction and able to wt-bear in left without knee buckling, motor planning seems to be larger deficit than true strength loss on this side.  LLE Sensation: decreased proprioception LLE Coordination: decreased gross motor Cervical / Trunk Assessment Cervical / Trunk Assessment: Normal     Mobility Bed Mobility Bed Mobility: Supine to Sit;Sitting - Scoot to Edge of Bed Supine to Sit: 6: Modified independent (Device/Increase time) Sitting - Scoot to Edge of Bed: 5: Supervision Sit to Supine: 6: Modified independent (Device/Increase time) Details for Bed Mobility Assistance: increased time needed Transfers Transfers: Sit to Stand;Stand to Sit Sit to Stand: 4: Min guard Stand to Sit: 4: Min guard Details for Transfer Assistance: pt able to stand to RW with min-guard A, vc's for hand placement. Pt with difficulty with SPT without AFO due to right knee buckling and difficulty moving it. +2 needed to allow for facilitation of the RLE mvmt        Balance Balance Balance Assessed: Yes Static Sitting Balance Static Sitting - Balance Support: Feet supported;No upper extremity supported Static Sitting - Level of Assistance: 5: Stand by assistance Static Standing Balance Static Standing - Balance Support: Bilateral upper extremity supported;During functional activity Static Standing - Level of Assistance: 5: Stand by assistance Dynamic Standing Balance Dynamic Standing - Balance Support: Bilateral upper extremity  supported;During functional activity Dynamic Standing - Level of Assistance: 5: Stand by assistance Dynamic Standing - Comments: balance does not seem to have declined with this new event   End of Session OT -  End of Session Equipment Utilized During Treatment: Gait belt;Rolling walker Activity Tolerance: Patient tolerated treatment well Patient left: in chair;with call bell/phone within reach Nurse Communication: Mobility status  GO     Sherryl Manges 09/22/2012, 9:34 AM

## 2012-09-22 NOTE — Progress Notes (Signed)
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 Also with remote left pons and globus pallidus infarcts.  Clinical Impression  Pt returns to [facility] with further weakness of the left side as well as increased weakness of the right LE that is significantly limiting her ability to ambulate. She is at mod A level with RW with ambulation, requiring manual facilitation of stepping of the RLE whereas, she went home at mod I level without an AD in her apt. She will benefit from acute PT to increase strength of bilateral LE's as well as increasing independence with mobility for eventual return home. At this point, recommend return to CIR as pt will need further rehab after acute setting.    PT Assessment  Patient needs continued PT services    Follow Up Recommendations  CIR    Does the patient have the potential to tolerate intense rehabilitation      Barriers to Discharge Decreased caregiver support      Equipment Recommendations  None recommended by PT    Recommendations for Other Services Rehab consult   Frequency Min 4X/week    Precautions / Restrictions Precautions Precautions: Fall Precaution Comments: pt went home from CIR and does not note falls but did note feeling uneasy at home Required Braces or Orthoses: Other Brace/Splint Other Brace/Splint: right AFO Restrictions Weight Bearing Restrictions: No   Pertinent Vitals/Pain No c.o pain      Mobility  Bed Mobility Bed Mobility: Supine to Sit Supine to Sit: 5: Supervision;With rails Sitting - Scoot to Edge of Bed: 5: Supervision Details for Bed Mobility Assistance: increased time needed Transfers Transfers: Sit to Stand;Stand to  Sit Sit to Stand: 4: Min guard;From bed;From chair/3-in-1 Stand to Sit: 4: Min guard;To chair/3-in-1 Stand Pivot Transfers: 1: +2 Total assist Stand Pivot Transfers: Patient Percentage: 60% Details for Transfer Assistance: pt able to stand to RW with min-guard A, vc's for hand placement. Pt with difficulty with SPT without AFO due to right knee buckling and difficulty moving it. +2 needed to allow for facilitation of the RLE mvmt Ambulation/Gait Ambulation/Gait Assistance: 3: Mod assist Ambulation Distance (Feet): 15 Feet Assistive device: Rolling walker Ambulation/Gait Assistance Details: unable to ambulate without AFO. With AFO, pt taking a shuffling, very short and ineffective step with the RLE. Manual facilitation needed for effective step length as pt continues to take normal step length with LLE with RLE getting further and further behind. Could not compensate with increased hip hike right side either. Assist to wt-shift to left and assist with right hip flexion Gait Pattern: Decreased step length - right;Decreased weight shift to left;Decreased dorsiflexion - right;Decreased hip/knee flexion - left Gait velocity: very slow Stairs: No Wheelchair Mobility Wheelchair Mobility: No Modified Rankin (Stroke Patients Only) Pre-Morbid Rankin Score: Moderate disability Modified Rankin: Moderately severe disability    Exercises     PT Diagnosis: Difficulty walking;Abnormality of gait;Hemiplegia non-dominant side  PT Problem List: Decreased strength;Decreased range of motion;Decreased activity tolerance;Decreased mobility;Decreased coordination;Decreased cognition PT Treatment Interventions: DME instruction;Gait training;Functional mobility training;Therapeutic activities;Therapeutic exercise;Balance training;Neuromuscular re-education;Cognitive remediation;Patient/family education     PT Goals(Current goals can be found in the care plan section) Acute Rehab PT Goals Patient Stated Goal:  unsure at this point PT Goal  Formulation: With patient Time For Goal Achievement: 10/06/12 Potential to Achieve Goals: Fair  Visit Information  Last PT Received On: 09/22/12 Assistance Needed: +1 PT/OT Co-Evaluation/Treatment: Yes History of Present Illness: 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 Also with remote left pons and globus pallidus infarcts.       Prior Functioning  Home Living Family/patient expects to be discharged to:: Private residence Living Arrangements: Alone Available Help at Discharge: Family;Available PRN/intermittently Type of Home: Apartment Home Access: Level entry Home Layout: One level Home Equipment: Walker - 2 wheels;Cane - single point;Wheelchair - manual Additional Comments: pt's daughter works. She left CIR able to get around her apt without an AD  Lives With: Alone Prior Function Level of Independence: Needs assistance ADL's / Homemaking Assistance Needed: daughter brought food a couple of days a week Communication Communication: Other (comment);Expressive difficulties (delayed response) Dominant Hand: Right    Cognition  Cognition Arousal/Alertness: Awake/alert Behavior During Therapy: Flat affect Overall Cognitive Status: Impaired/Different from baseline Area of Impairment: Problem solving;Following commands Current Attention Level: Sustained Following Commands: Follows one step commands consistently Problem Solving: Decreased initiation;Slow processing;Difficulty sequencing;Requires verbal cues;Requires tactile cues General Comments: Pt has delayed response to questions. Pt says that she is right handed, but used left hand more during session. Pt able to remember where things were in her room (direct PT to where her AFO was and how to use it). Problem solving required tactile and vc's. Difficult to assess due to:  (flat affect and slow processing)    Extremity/Trunk Assessment  Upper Extremity Assessment Upper Extremity Assessment: LUE deficits/detail Lower Extremity Assessment Lower Extremity Assessment: RLE deficits/detail;LLE deficits/detail RLE Deficits / Details: decreased active df right LE, hip and knee 3+/5 on MMT but right knee buckles with wt-bearing when not using AFO.  RLE Sensation: decreased proprioception RLE Coordination: decreased gross motor LLE Deficits / Details: left knee ext 2/5 with MMT but when knee fully extended, able to hold it out straight maintaining quad contraction and able to wt-bear in left without knee buckling, motor planning seems to be larger deficit than true strength loss on this side.  LLE Sensation: decreased proprioception LLE Coordination: decreased gross motor Cervical / Trunk Assessment Cervical / Trunk Assessment: Normal   Balance Balance Balance Assessed: Yes Static Sitting Balance Static Sitting - Balance Support: No upper extremity supported;Feet supported Static Sitting - Level of Assistance: 6: Modified independent (Device/Increase time) Static Standing Balance Static Standing - Balance Support: No upper extremity supported;During functional activity Static Standing - Level of Assistance: 5: Stand by assistance Dynamic Standing Balance Dynamic Standing - Balance Support: Bilateral upper extremity supported;During functional activity Dynamic Standing - Level of Assistance: 5: Stand by assistance Dynamic Standing - Comments: balance does not seem to have declined with this new event  End of Session PT - End of Session Equipment Utilized During Treatment: Gait belt;Other (comment) (AFO) Activity Tolerance: Patient limited by fatigue Patient left: in chair;with call bell/phone within reach Nurse Communication: Mobility status  GP   Lyanne Co, PT  Acute Rehab Services  [phone]   Lyanne Co 09/22/2012, 9:27 AM

## 2012-09-22 NOTE — Progress Notes (Signed)
Stroke Team Progress Note   SUBJECTIVE Overall she feels her condition is stable.   OBJECTIVE Most recent Vital Signs: Filed Vitals:   09/22/12 0441 09/22/12 0701 09/22/12 0845 09/22/12 0939  BP: 117/68 105/82  109/68  Pulse: 87 68 105 91  Temp: 97.5 F (36.4 C) 97.4 F (36.3 C)  97.5 F (36.4 C)  TempSrc: Oral Oral  Oral  Resp: 10 15  18   Height:      Weight:      SpO2: 100% 100%  100%   CBG (last 3)   Recent Labs  09/21/12 2102 09/22/12 0658 09/22/12 1123  GLUCAP 83 141* 142*    IV Fluid Intake:     MEDICATIONS  . aspirin  300 mg Rectal Daily   Or  . aspirin  325 mg Oral Daily  . atorvastatin  40 mg Oral q1800  . citalopram  10 mg Oral Daily  . enoxaparin (LOVENOX) injection  40 mg Subcutaneous Daily  . feeding supplement  237 mL Oral BID BM  . insulin aspart  0-9 Units Subcutaneous TID WC  . multivitamin with minerals  1 tablet Oral Daily   PRN:  clonazePAM, HYDROmorphone (DILAUDID) injection, senna-docusate  Diet:  Carb Control   CLINICALLY SIGNIFICANT STUDIES Basic Metabolic Panel:  Recent Labs Lab 09/21/12 2039  NA 139  K 4.4  CL 104  CO2 22  GLUCOSE 92  BUN 17  CREATININE 1.18*  CALCIUM 10.0   Liver Function Tests:  Recent Labs Lab 09/21/12 2039  AST 18  ALT 19  ALKPHOS 95  BILITOT 0.3  PROT 7.4  ALBUMIN 3.7   CBC:  Recent Labs Lab 09/21/12 2039  WBC 6.4  NEUTROABS 3.9  HGB 12.4  HCT 37.0  MCV 78.9  PLT 273   Coagulation: No results found for this basename: LABPROT, INR,  in the last 168 hours Cardiac Enzymes: No results found for this basename: CKTOTAL, CKMB, CKMBINDEX, TROPONINI,  in the last 168 hours Urinalysis: No results found for this basename: COLORURINE, APPERANCEUR, LABSPEC, PHURINE, GLUCOSEU, HGBUR, BILIRUBINUR, KETONESUR, PROTEINUR, UROBILINOGEN, NITRITE, LEUKOCYTESUR,  in the last 168 hours Lipid Panel    Component Value Date/Time   CHOL 195 08/27/2012 0530   TRIG 120 08/27/2012 0530   HDL 43 08/27/2012 0530   CHOLHDL 4.5 08/27/2012 0530   VLDL 24 08/27/2012 0530   LDLCALC 128* 08/27/2012 0530   HgbA1C  Lab Results  Component Value Date   HGBA1C 5.9* 08/27/2012    Urine Drug Screen:   No results found for this basename: labopia, cocainscrnur, labbenz, amphetmu, thcu, labbarb    Alcohol Level: No results found for this basename: ETH,  in the last 168 hours  Dg Chest 2 View  09/22/2012   *RADIOLOGY REPORT*  Clinical Data: Weakness.  Headache.  CHEST - 2 VIEW  Comparison: Plain film chest 03/08/2009.  Findings: Lungs are clear.  Heart size is normal.  No pneumothorax or pleural fluid.  IMPRESSION: No acute disease.   Original Report Authenticated By: Holley Dexter, M.D.   Ct Head Wo Contrast  09/21/2012   *RADIOLOGY REPORT*  Clinical Data: Acute onset occipital headache 2 days ago.  History of prior infarct.  CT HEAD WITHOUT CONTRAST  Technique:  Contiguous axial images were obtained from the base of the skull through the vertex without contrast.  Comparison: Brain MRI 08/26/2012 and head CT scan 07/01/2012.  Findings: Remote left PCA territory infarct is identified.  No evidence of acute infarction, hemorrhage, mass lesion, mass  effect, midline shift or abnormal excessive fluid collection.  Chronic microvascular ischemic change is noted.  Imaged paranasal sinuses and mastoid air cells are clear.  IMPRESSION:  1.  No acute abnormality. 2.  Remote left ACA territory infarct and chronic microvascular ischemic change.   Original Report Authenticated By: Holley Dexter, M.D.   Mr Brain Wo Contrast  09/21/2012   *RADIOLOGY REPORT*  Clinical Data:  left-sided weakness  MRI HEAD WITHOUT CONTRAST  Technique:  Multiplanar, multiecho pulse sequences of the brain and surrounding structures were obtained according to standard protocol without intravenous contrast.  Comparison: Prior CT performed earlier on the same day as well as prior MRI from 08/26/2012.  Findings: Small focus of restricted diffusion is present at the  junction of the right thalamus and posterior limb of the right internal capsule, in the region of previously identified infarct seen on prior MRI from 08/26/2012. This finding most likely represents a new infarction in this region. There is subtle signal dropout on the corresponding ADC map.  Remote moderate to large size infarct at the medial aspect of the left frontal and parietal lobes and in the anterior communicating artery distribution with associated encephalomalacia is again seen. Secondary ex vacuo dilatation of the left lateral ventricle with Wallerian degeneration again noted.  Remote infarcts involving the left pons, and left globus pallidus are again noted.  No intracranial mass lesions or midline shift.  No extra-axial fluid collection.  IMPRESSION: 1.  Acute ischemic infarct involving the posterior limb of the right internal capsule, at or just adjacent to recently identified acute ischemic infarct seen in this region on MRI from 08/26/2012. 2.  Stable appearance of additional remote infarcts as detailed above.   Original Report Authenticated By: Rise Mu, M.D.   ASSESSMENT [name] is a 62 y.o. female with evolution versus new acute posterior limb internal capsule infarct. Acute and/or current infarcts due to small vessel disease. No change in treatment recommended. No need to repeat previous workup.  Hospital day # 1  TREATMENT/PLAN  Continue aspirin 325 mg orally every day for secondary stroke prevention.  Stable to return to rehabilitation from the stroke standpoint No further stroke workup indicated. Patient has a 10-15% risk of having another stroke over the next year, the highest risk is within 2 weeks of the most recent stroke/TIA (risk of having a stroke following a stroke or TIA is the same). Ongoing risk factor control by Primary Care Physician Stroke Service will sign off. Please call should any needs arise. Follow up with Dr. Pearlean Brownie, Stroke Clinic, in 2  months.   Annie Main, MSN, RN, ANVP-BC, ANP-BC, [facility] Pager: [phone] 09/22/2012 11:57 AM  I have personally obtained a history, examined the patient, evaluated imaging results, and formulated the assessment and plan of care. I agree with the above.

## 2012-09-22 NOTE — Progress Notes (Signed)
I met with patient at bedside and then contacted her daughter by phone to discuss admission to inpt rehab today. Both are in agreement. RN CM as well as Dr. Rito Ehrlich are aware. [phone]

## 2012-09-22 NOTE — H&P (View-Only) (Signed)
Physical Medicine and Rehabilitation Admission H&P    Chief Complaint  Patient presents with  . Headache  : HPI: Sophia Rodriguez is a 61 y.o. right handed female with history of hypertension as well as diabetes mellitus with peripheral neuropathy. Patient history multi-CVA with right-sided weakness residual and recent admission inpatient rehabilitation services discharged 09/10/2012 on Aggrenox therapy for CVA prophylaxis. Patient was using a walker at home living with her daughter. Outpatient therapy had been arranged the patient did not have the necessary transportation. Admitted 09/22/2012 with severe headache and increasing left-sided weakness. MRI of the brain showed acute ischemic infarct posterior limb of the right internal capsule versus extension of previous infarct as well as stable appearance of additional remote infarcts. Patient did not receive TPA. Neurology service followup presently on aspirin therapy with the addition of subcutaneous Lovenox for DVT prophylaxis and full workup ongoing. Patient maintained on a regular consistency diet. Physical and occupational therapy evaluations completed. M.D. Has requested physical medicine rehabilitation consult consider inpatient rehabilitation services. Patient felt to be a good candidate for inpatient rehabilitation services and was admitted for comprehensive rehabilitation program  Patient feels weaker on the left side.  Review of Systems  Gastrointestinal: Positive for constipation.  Musculoskeletal: Positive for myalgias.  Neurological: Positive for weakness. Headache Psychiatric/Behavioral: Positive for depression.  All other systems reviewed and are negative   Past Medical History  Diagnosis Date  . Stroke   . Hypertension   . Diabetes mellitus    History reviewed. No pertinent past surgical history. Family History  Problem Relation Age of Onset  . Stroke Brother    Social History:  reports that she quit smoking about 5 years  ago. She does not have any smokeless tobacco history on file. She reports that she does not drink alcohol or use illicit drugs. Allergies: No Known Allergies Medications Prior to Admission  Medication Sig Dispense Refill  . atorvastatin (LIPITOR) 40 MG tablet Take 1 tablet (40 mg total) by mouth daily at 6 PM.      . citalopram (CELEXA) 10 MG tablet Take 10 mg by mouth daily.      . clonazePAM (KLONOPIN) 0.5 MG tablet Take 0.5 mg by mouth daily as needed for anxiety.      . diclofenac sodium (VOLTAREN) 1 % GEL Apply 2 g topically 4 (four) times daily. To left wrist.  4 Tube  1  . dipyridamole-aspirin (AGGRENOX) 200-25 MG per 12 hr capsule Take 1 capsule by mouth 2 (two) times daily.  60 capsule  1  . metFORMIN (GLUCOPHAGE) 500 MG tablet Take 0.5 tablets (250 mg total) by mouth 2 (two) times daily with a meal. RESUME AT HALF A PILL TWICE A DAY TILL FOLLOW UP WITH MD.      . Multiple Vitamin (MULTIVITAMIN WITH MINERALS) TABS Take 1 tablet by mouth daily.      . zolpidem (AMBIEN) 10 MG tablet Take 10 mg by mouth at bedtime as needed for sleep.        Home: Home Living Family/patient expects to be discharged to:: Private residence Living Arrangements: Alone Available Help at Discharge: Family;Available PRN/intermittently Type of Home: Apartment Home Access: Level entry Home Layout: One level Home Equipment: Walker - 2 wheels;Cane - single point;Wheelchair - manual Additional Comments: pt's daughter works. She left CIR able to get around her apt without an AD  Lives With: Alone   Functional History:    Functional Status:  Mobility: Bed Mobility Bed Mobility: Supine to Sit;Sitting - Scoot   to Edge of Bed Supine to Sit: 6: Modified independent (Device/Increase time) Sitting - Scoot to Edge of Bed: 5: Supervision Sit to Supine: 6: Modified independent (Device/Increase time) Transfers Transfers: Sit to Stand;Stand to Sit Sit to Stand: 4: Min guard Stand to Sit: 4: Min guard Stand Pivot  Transfers: 1: +2 Total assist Stand Pivot Transfers: Patient Percentage: 60% Ambulation/Gait Ambulation/Gait Assistance: 3: Mod assist Ambulation Distance (Feet): 15 Feet Assistive device: Rolling walker Ambulation/Gait Assistance Details: unable to ambulate without AFO. With AFO, pt taking a shuffling, very short and ineffective step with the RLE. Manual facilitation needed for effective step length as pt continues to take normal step length with LLE with RLE getting further and further behind. Could not compensate with increased hip hike right side either. Assist to wt-shift to left and assist with right hip flexion Gait Pattern: Decreased step length - right;Decreased weight shift to left;Decreased dorsiflexion - right;Decreased hip/knee flexion - left Gait velocity: very slow Stairs: No Wheelchair Mobility Wheelchair Mobility: No  ADL: ADL Eating/Feeding: Independent Where Assessed - Eating/Feeding: Chair Grooming: Wash/dry hands;Set up;Performed Where Assessed - Grooming: Unsupported sitting Upper Body Bathing: Supervision/safety Where Assessed - Upper Body Bathing: Unsupported sitting Lower Body Bathing: +1 Total assistance Where Assessed - Lower Body Bathing: Unsupported sitting Upper Body Dressing: Set up Where Assessed - Upper Body Dressing: Unsupported sitting Lower Body Dressing: +1 Total assistance Where Assessed - Lower Body Dressing: Unsupported sitting Toilet Transfer: Maximal assistance Toilet Transfer Method: Sit to stand Toilet Transfer Equipment: Raised toilet seat with arms (or 3-in-1 over toilet) Tub/Shower Transfer: +1 Total assistance Equipment Used: Gait belt;Rolling walker Transfers/Ambulation Related to ADLs: Pt was min guard for transfers sit <> stand, and mod assist for ambulation. Pt needed assistance with advancing the RLE, and max vc's for safety. ADL Comments: Pt at [place] level for donning underwear with sit to stand transfer. Pt performed toileting  and at Min A level for hygiene/clothing.   Cognition: Cognition Overall Cognitive Status: Impaired/Different from baseline Orientation Level: Oriented X4 Cognition Arousal/Alertness: Awake/alert Behavior During Therapy: Flat affect Overall Cognitive Status: Impaired/Different from baseline Area of Impairment: Problem solving;Following commands Current Attention Level: Sustained Following Commands: Follows one step commands consistently Problem Solving: Decreased initiation;Slow processing;Difficulty sequencing;Requires verbal cues;Requires tactile cues General Comments: Pt has delayed response to questions. Pt says that she is right handed, but used left hand more during session. Pt able to remember where things were in her room (direct PT to where her AFO was and how to use it). Problem solving required tactile and vc's. Difficult to assess due to:  (flat affect and slow processing)  Physical Exam: Blood pressure 109/68, pulse 91, temperature 97.5 F (36.4 C), temperature source Oral, resp. rate 18, height 5' 5" (1.651 m), weight 74.798 kg (164 lb 14.4 oz), SpO2 100.00%. General: . NAD Constitutional: She is oriented to person, place, and time.  HENT: oral mucosa pink and moist Head: Normocephalic.  Eyes: EOM are normal.  Neck: Normal range of motion. Neck supple. No thyromegaly present.  Cardiovascular: Normal rate and regular rhythm without murmur  Pulmonary/Chest: Effort normal and breath sounds normal. No respiratory distress. No wheezes Abdominal: Soft. Bowel sounds are normal. She exhibits no distension. Musculoskeletal: full ROM, no pain, swelling   Neurological: She is alert and oriented to person, place, and time.  Follow simple commands/ reasonable insight and awareness. CN notable for mild left facial weakness and tongue deviation 3- to 3/5 strength in the left deltoid, bicep, tricep, grip, hip   flexor, knee extensors,  0/5 ankle dorsiflexor and plantar flexor  Right  ankle dorsiflexor 0/5 right upper extremity 4/5 in the deltoid, bicep, tricep, grip  Right lower extremities 4/5 in the right hip flexor knee extensor  Sensation reduced in the left lower extremity and left upper extremity.  Skin: Skin is warm and dry.  Psychiatric: She has a normal mood and affect. Slightly flat    Results for orders placed during the hospital encounter of 09/21/12 (from the past 48 hour(s))  CBC WITH DIFFERENTIAL     Status: None   Collection Time    09/21/12  8:39 PM      Result Value Range   WBC 6.4  4.0 - 10.5 K/uL   RBC 4.69  3.87 - 5.11 MIL/uL   Hemoglobin 12.4  12.0 - 15.0 g/dL   HCT 37.0  36.0 - 46.0 %   MCV 78.9  78.0 - 100.0 fL   MCH 26.4  26.0 - 34.0 pg   MCHC 33.5  30.0 - 36.0 g/dL   RDW 14.5  11.5 - 15.5 %   Platelets 273  150 - 400 K/uL   Neutrophils Relative % 61  43 - 77 %   Neutro Abs 3.9  1.7 - 7.7 K/uL   Lymphocytes Relative 29  12 - 46 %   Lymphs Abs 1.9  0.7 - 4.0 K/uL   Monocytes Relative 8  3 - 12 %   Monocytes Absolute 0.5  0.1 - 1.0 K/uL   Eosinophils Relative 2  0 - 5 %   Eosinophils Absolute 0.1  0.0 - 0.7 K/uL   Basophils Relative 0  0 - 1 %   Basophils Absolute 0.0  0.0 - 0.1 K/uL  COMPREHENSIVE METABOLIC PANEL     Status: Abnormal   Collection Time    09/21/12  8:39 PM      Result Value Range   Sodium 139  135 - 145 mEq/L   Potassium 4.4  3.5 - 5.1 mEq/L   Chloride 104  96 - 112 mEq/L   CO2 22  19 - 32 mEq/L   Glucose, Bld 92  70 - 99 mg/dL   BUN 17  6 - 23 mg/dL   Creatinine, Ser 1.18 (*) 0.50 - 1.10 mg/dL   Calcium 10.0  8.4 - 10.5 mg/dL   Total Protein 7.4  6.0 - 8.3 g/dL   Albumin 3.7  3.5 - 5.2 g/dL   AST 18  0 - 37 U/L   ALT 19  0 - 35 U/L   Alkaline Phosphatase 95  39 - 117 U/L   Total Bilirubin 0.3  0.3 - 1.2 mg/dL   GFR calc non Af Amer 49 (*) >90 mL/min   GFR calc Af Amer 57 (*) >90 mL/min   Comment:            The eGFR has been calculated     using the CKD EPI equation.     This calculation has not been      validated in all clinical     situations.     eGFR's persistently     <90 mL/min signify     possible Chronic Kidney Disease.  GLUCOSE, CAPILLARY     Status: None   Collection Time    09/21/12  9:02 PM      Result Value Range   Glucose-Capillary 83  70 - 99 mg/dL   Comment 1 Notify RN     Comment 2 Documented   in Chart    GLUCOSE, CAPILLARY     Status: Abnormal   Collection Time    09/22/12  6:58 AM      Result Value Range   Glucose-Capillary 141 (*) 70 - 99 mg/dL   Comment 1 Notify RN    GLUCOSE, CAPILLARY     Status: Abnormal   Collection Time    09/22/12 11:23 AM      Result Value Range   Glucose-Capillary 142 (*) 70 - 99 mg/dL   Dg Chest 2 View  09/22/2012   *RADIOLOGY REPORT*  Clinical Data: Weakness.  Headache.  CHEST - 2 VIEW  Comparison: Plain film chest 03/08/2009.  Findings: Lungs are clear.  Heart size is normal.  No pneumothorax or pleural fluid.  IMPRESSION: No acute disease.   Original Report Authenticated By: Thomas D'Alessio, M.D.   Ct Head Wo Contrast  09/21/2012   *RADIOLOGY REPORT*  Clinical Data: Acute onset occipital headache 2 days ago.  History of prior infarct.  CT HEAD WITHOUT CONTRAST  Technique:  Contiguous axial images were obtained from the base of the skull through the vertex without contrast.  Comparison: Brain MRI 08/26/2012 and head CT scan 07/01/2012.  Findings: Remote left PCA territory infarct is identified.  No evidence of acute infarction, hemorrhage, mass lesion, mass effect, midline shift or abnormal excessive fluid collection.  Chronic microvascular ischemic change is noted.  Imaged paranasal sinuses and mastoid air cells are clear.  IMPRESSION:  1.  No acute abnormality. 2.  Remote left ACA territory infarct and chronic microvascular ischemic change.   Original Report Authenticated By: Thomas D'Alessio, M.D.   Mr Brain Wo Contrast  09/21/2012   *RADIOLOGY REPORT*  Clinical Data:  left-sided weakness  MRI HEAD WITHOUT CONTRAST  Technique:   Multiplanar, multiecho pulse sequences of the brain and surrounding structures were obtained according to standard protocol without intravenous contrast.  Comparison: Prior CT performed earlier on the same day as well as prior MRI from 08/26/2012.  Findings: Small focus of restricted diffusion is present at the junction of the right thalamus and posterior limb of the right internal capsule, in the region of previously identified infarct seen on prior MRI from 08/26/2012. This finding most likely represents a new infarction in this region. There is subtle signal dropout on the corresponding ADC map.  Remote moderate to large size infarct at the medial aspect of the left frontal and parietal lobes and in the anterior communicating artery distribution with associated encephalomalacia is again seen. Secondary ex vacuo dilatation of the left lateral ventricle with Wallerian degeneration again noted.  Remote infarcts involving the left pons, and left globus pallidus are again noted.  No intracranial mass lesions or midline shift.  No extra-axial fluid collection.  IMPRESSION: 1.  Acute ischemic infarct involving the posterior limb of the right internal capsule, at or just adjacent to recently identified acute ischemic infarct seen in this region on MRI from 08/26/2012. 2.  Stable appearance of additional remote infarcts as detailed above.   Original Report Authenticated By: Benjamin McClintock, M.D.    Post Admission Physician Evaluation: 1. Functional deficits secondary  to thrombotic right internal capsule infarct. 2. Patient is admitted to receive collaborative, interdisciplinary care between the physiatrist, rehab nursing staff, and therapy team. 3. Patient's level of medical complexity and substantial therapy needs in context of that medical necessity cannot be provided at a lesser intensity of care such as a SNF. 4. Patient has experienced substantial functional loss from his/her baseline   which was documented  above under the "Functional History" and "Functional Status" headings.  Judging by the patient's diagnosis, physical exam, and functional history, the patient has potential for functional progress which will result in measurable gains while on inpatient rehab.  These gains will be of substantial and practical use upon discharge  in facilitating mobility and self-care at the household level. 5. Physiatrist will provide 24 hour management of medical needs as well as oversight of the therapy plan/treatment and provide guidance as appropriate regarding the interaction of the two. 6. 24 hour rehab nursing will assist with bladder management, bowel management, safety, skin/wound care, disease management, medication administration, pain management and patient education  and help integrate therapy concepts, techniques,education, etc. 7. PT will assess and treat for/with: Lower extremity strength, range of motion, stamina, balance, functional mobility, safety, adaptive techniques and equipment, NMR, orthotics, education.   Goals are: mod I. 8. OT will assess and treat for/with: ADL's, functional mobility, safety, upper extremity strength, adaptive techniques and equipment, NMR, orthotic use, education.   Goals are: mod I. 9. SLP will assess and treat for/with: cognition, communication  Goals are: mod I. 10. Case Management and Social Worker will assess and treat for psychological issues and discharge planning. 11. Team conference will be held weekly to assess progress toward goals and to determine barriers to discharge. 12. Patient will receive at least 3 hours of therapy per day at least 5 days per week. 13. ELOS: 7-10 days       14. Prognosis:  excellent   Medical Problem List and Plan: 1. 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 DVT Prophylaxis/Anticoagulation: Subcutaneous Lovenox. Monitor platelet counts any signs of bleeding 3. Pain Management:  Tylenol as  needed 4. Mood/depression. Celexa 10 mg daily, Klonopin 0.5 mg twice a day. Provide emotional support. May need further increase of celexa---follow clinically 5. Neuropsych: This patient is capable of making decisions on her own behalf. 6. Hypertension. Currently on no antihypertensive medications. Monitor as her activity increases.  7. Diabetes mellitus with peripheral neuropathy. Latest hemoglobin A1c of 5.9. . Patient on Glucophage 250 mg twice a day prior to admission. Check blood sugars a.c. and at bedtime. Adjust regimen as indicated 8. Hyperlipidemia. Lipitor           Zachary T. [name], MD, FAAPMR [facility]   09/22/2012 

## 2012-09-22 NOTE — Consult Note (Signed)
Neurology Consultation Reason for Consult: Worsening left-sided weakness Referring Physician: Leander Rams  CC: Worsening left-sided weakness  History is obtained from: Patient  HPI: Sophia Rodriguez is a 62 y.o. female with a infarct the posterior limb of the internal capsule that was diagnosed on 6/5. She was making steady improvement up until she went to bed last night. She remembers waking up in the middle the night with worsening left-sided weakness, and on awakening, she had significant worse weakness of the left side. She also complains of headache throughout the day, but the time of my discussion with her this is resolved.  She's had no changes in the past few days in regards to her medication regimen   LKW: Prior to going to bed 6/30 tpa given: no, outside of window, recent stroke    ROS: A 14 point ROS was performed and is negative except as noted in the HPI.  Past Medical History  Diagnosis Date  . Stroke   . Hypertension   . Diabetes mellitus     Family History: Brother-stroke  Social History: Tob: Denies  Exam: Current vital signs: BP 123/78  Pulse 69  Temp(Src) 98.7 F (37.1 C) (Oral)  Resp 9  SpO2 100% Vital signs in last 24 hours: Temp:  [98.1 F (36.7 C)-98.7 F (37.1 C)] 98.7 F (37.1 C) (07/01 2048) Pulse Rate:  [65-92] 69 (07/01 2100) Resp:  [6-16] 9 (07/01 2100) BP: (123-142)/(78-95) 123/78 mmHg (07/01 2015) SpO2:  [99 %-100 %] 100 % (07/01 2100)  General: In bed, NAD CV: Regular rate and rhythm Mental Status: Patient is awake, alert, oriented to person, place, month, year, and situation. Immediate and remote memory are intact. Patient is able to give a clear and coherent history. No signs of aphasia or neglect Cranial Nerves: II: Visual Fields are full. Pupils are equal, round, and reactive to light.  Discs are difficult to visualize. III,IV, VI: EOMI without ptosis or diploplia.  V: Facial sensation is symmetric to temperature VII: Facial  movement is symmetric VIII: hearing is intact to voice X: Uvula elevates symmetrically XI: Shoulder shrug is symmetric. XII: tongue is midline without atrophy or fasciculations.  Motor: Tone is normal. Bulk is normal. Left arm has 4/5 weakness worse distally than proximally, left leg has 4/5 proximally, and 4+/5 distally. Also mildly weak proximally in right arm.  Sensory: Sensation is symmetric to light touch and temperature in the arms and legs. Deep Tendon Reflexes: 2+ and symmetric in the biceps and patellae.  Cerebellar: FNF consistent with weakness, no ataxia Gait: Not assessed due to acute nature of evaluation and multiple medical monitors in ED setting.  I have reviewed labs in epic and the results pertinent to this consultation are: Mildly elevated creatinine  I have reviewed the images obtained: MRI brain-there is an area of restricted diffusion just adjacent to her recent stroke area.   Impression: 62 year old female with worsening left hemiparesis and positive diffusion an MRI.This typically should have faded month after the infarct, but would not say it is possible that this represents her in for from June 5. The presence of this signal, coupled with her clinical worsening by history does make me suspect that this does represent slight extension of her previous infarct  Recommendations: 1) Would admit to observe overnight and have PT reevaluate.  2) Would continue aggrenox for antiplatelet at this time.  3) No need to repeat recent stroke workup.    Ritta Slot, MD [facility] [phone]  If 7pm- 7am,  please page neurology on call at [phone].

## 2012-09-22 NOTE — Consult Note (Signed)
Physical Medicine and Rehabilitation Consult Reason for Consult: CVA Referring Physician: [facility]   HPI: Sophia Rodriguez is a 62 y.o. right handed female with history of hypertension as well as diabetes mellitus with peripheral neuropathy. Patient history multi-CVA with right-sided weakness residual and recent admission inpatient rehabilitation services discharged 09/10/2012 on Aggrenox therapy for CVA prophylaxis. Patient was using a walker at home living with her daughter. Admitted 09/22/2012 with severe headache and increasing left-sided weakness. MRI of the brain showed acute ischemic infarct posterior limb of the right internal capsule as well as stable appearance of additional remote infarcts. Patient did not receive TPA. Neurology service followup presently on aspirin therapy with the addition of subcutaneous Lovenox for DVT prophylaxis and full workup ongoing. Patient maintained on a regular consistency diet. Physical and occupational therapy evaluations are pending. M.D. is requested physical medicine rehabilitation consult consider inpatient rehabilitation services  Patient feels weaker on the left side. Review of Systems  Gastrointestinal: Positive for constipation.  Musculoskeletal: Positive for myalgias.  Neurological: Positive for weakness.  Psychiatric/Behavioral: Positive for depression.  All other systems reviewed and are negative.   Past Medical History  Diagnosis Date  . Stroke   . Hypertension   . Diabetes mellitus    History reviewed. No pertinent past surgical history. Family History  Problem Relation Age of Onset  . Stroke Brother    Social History:  reports that she quit smoking about 5 years ago. She does not have any smokeless tobacco history on file. She reports that she does not drink alcohol or use illicit drugs. Allergies: No Known Allergies Medications Prior to Admission  Medication Sig Dispense Refill  . atorvastatin (LIPITOR) 40 MG tablet Take 1 tablet (40  mg total) by mouth daily at 6 PM.      . citalopram (CELEXA) 10 MG tablet Take 10 mg by mouth daily.      . clonazePAM (KLONOPIN) 0.5 MG tablet Take 0.5 mg by mouth daily as needed for anxiety.      . diclofenac sodium (VOLTAREN) 1 % GEL Apply 2 g topically 4 (four) times daily. To left wrist.  4 Tube  1  . dipyridamole-aspirin (AGGRENOX) 200-25 MG per 12 hr capsule Take 1 capsule by mouth 2 (two) times daily.  60 capsule  1  . metFORMIN (GLUCOPHAGE) 500 MG tablet Take 0.5 tablets (250 mg total) by mouth 2 (two) times daily with a meal. RESUME AT HALF A PILL TWICE A DAY TILL FOLLOW UP WITH MD.      . Multiple Vitamin (MULTIVITAMIN WITH MINERALS) TABS Take 1 tablet by mouth daily.      Marland Kitchen zolpidem (AMBIEN) 10 MG tablet Take 10 mg by mouth at bedtime as needed for sleep.        Home: Home Living Family/patient expects to be discharged to:: Private residence Living Arrangements: Alone Available Help at Discharge: Family;Available PRN/intermittently Type of Home: Apartment Home Access: Level entry Home Layout: One level Home Equipment: Walker - 2 wheels;Cane - single point;Wheelchair - manual Additional Comments: pt's daughter works. She left CIR able to get around her apt without an AD  Lives With: Alone  Functional History:   Functional Status:  Mobility: Bed Mobility Bed Mobility: Supine to Sit;Sitting - Scoot to Edge of Bed Supine to Sit: 6: Modified independent (Device/Increase time) Sitting - Scoot to Edge of Bed: 5: Supervision Sit to Supine: 6: Modified independent (Device/Increase time) Transfers Transfers: Sit to Stand;Stand to Sit Sit to Stand: 4: Min guard Stand to  Sit: 4: Min guard Stand Pivot Transfers: 1: +2 Total assist Stand Pivot Transfers: Patient Percentage: 60% Ambulation/Gait Ambulation/Gait Assistance: 3: Mod assist Ambulation Distance (Feet): 15 Feet Assistive device: Rolling walker Ambulation/Gait Assistance Details: unable to ambulate without AFO. With  AFO, pt taking a shuffling, very short and ineffective step with the RLE. Manual facilitation needed for effective step length as pt continues to take normal step length with LLE with RLE getting further and further behind. Could not compensate with increased hip hike right side either. Assist to wt-shift to left and assist with right hip flexion Gait Pattern: Decreased step length - right;Decreased weight shift to left;Decreased dorsiflexion - right;Decreased hip/knee flexion - left Gait velocity: very slow Stairs: No Wheelchair Mobility Wheelchair Mobility: No  ADL: ADL Eating/Feeding: Independent Where Assessed - Eating/Feeding: Chair Grooming: Wash/dry hands;Set up;Performed Where Assessed - Grooming: Unsupported sitting Upper Body Bathing: Supervision/safety Where Assessed - Upper Body Bathing: Unsupported sitting Lower Body Bathing: +1 Total assistance Where Assessed - Lower Body Bathing: Unsupported sitting Upper Body Dressing: Set up Where Assessed - Upper Body Dressing: Unsupported sitting Lower Body Dressing: +1 Total assistance Where Assessed - Lower Body Dressing: Unsupported sitting Toilet Transfer: Maximal assistance Toilet Transfer Method: Sit to stand Toilet Transfer Equipment: Raised toilet seat with arms (or 3-in-1 over toilet) Tub/Shower Transfer: +1 Total assistance Equipment Used: Gait belt;Rolling walker Transfers/Ambulation Related to ADLs: Pt was min guard for transfers sit <> stand, and mod assist for ambulation. Pt needed assistance with advancing the RLE, and max vc's for safety. ADL Comments: Pt at [place] level for donning underwear with sit to stand transfer. Pt performed toileting and at Min A level for hygiene/clothing.   Cognition: Cognition Overall Cognitive Status: Impaired/Different from baseline Orientation Level: Oriented X4 Cognition Arousal/Alertness: Awake/alert Behavior During Therapy: Flat affect Overall Cognitive Status:  Impaired/Different from baseline Area of Impairment: Problem solving;Following commands Current Attention Level: Sustained Following Commands: Follows one step commands consistently Problem Solving: Decreased initiation;Slow processing;Difficulty sequencing;Requires verbal cues;Requires tactile cues General Comments: Pt has delayed response to questions. Pt says that she is right handed, but used left hand more during session. Pt able to remember where things were in her room (direct PT to where her AFO was and how to use it). Problem solving required tactile and vc's. Difficult to assess due to:  (flat affect and slow processing)  Blood pressure 109/68, pulse 91, temperature 97.5 F (36.4 C), temperature source Oral, resp. rate 18, height 5\' 5"  (1.651 m), weight 74.798 kg (164 lb 14.4 oz), SpO2 100.00%. Physical Exam  Vitals reviewed. Constitutional: She is oriented to person, place, and time.  HENT:  Head: Normocephalic.  Eyes: EOM are normal.  Neck: Normal range of motion. Neck supple. No thyromegaly present.  Cardiovascular: Normal rate and regular rhythm.   Pulmonary/Chest: Effort normal and breath sounds normal. No respiratory distress.  Abdominal: Soft. Bowel sounds are normal. She exhibits no distension.  Musculoskeletal:  Left foot drop  Neurological: She is alert and oriented to person, place, and time.  Follow simple commands  Skin: Skin is warm and dry.  Psychiatric: She has a normal mood and affect.   3/5 strength in the left deltoid, bicep, tricep, grip, hip flexor, knee extensors 0/5 ankle dorsiflexor and plantar flexor Right ankle dorsiflexor 0/5 right upper extremity 4/5 in the deltoid, bicep, tricep, grip Right lower extremities 4/5 in the right hip flexor knee extensor Sensation reduced in the left lower extremity  Results for orders placed during the hospital  encounter of 09/21/12 (from the past 24 hour(s))  CBC WITH DIFFERENTIAL     Status: None   Collection Time     09/21/12  8:39 PM      Result Value Range   WBC 6.4  4.0 - 10.5 K/uL   RBC 4.69  3.87 - 5.11 MIL/uL   Hemoglobin 12.4  12.0 - 15.0 g/dL   HCT 40.9  81.1 - 91.4 %   MCV 78.9  78.0 - 100.0 fL   MCH 26.4  26.0 - 34.0 pg   MCHC 33.5  30.0 - 36.0 g/dL   RDW 78.2  95.6 - 21.3 %   Platelets 273  150 - 400 K/uL   Neutrophils Relative % 61  43 - 77 %   Neutro Abs 3.9  1.7 - 7.7 K/uL   Lymphocytes Relative 29  12 - 46 %   Lymphs Abs 1.9  0.7 - 4.0 K/uL   Monocytes Relative 8  3 - 12 %   Monocytes Absolute 0.5  0.1 - 1.0 K/uL   Eosinophils Relative 2  0 - 5 %   Eosinophils Absolute 0.1  0.0 - 0.7 K/uL   Basophils Relative 0  0 - 1 %   Basophils Absolute 0.0  0.0 - 0.1 K/uL  COMPREHENSIVE METABOLIC PANEL     Status: Abnormal   Collection Time    09/21/12  8:39 PM      Result Value Range   Sodium 139  135 - 145 mEq/L   Potassium 4.4  3.5 - 5.1 mEq/L   Chloride 104  96 - 112 mEq/L   CO2 22  19 - 32 mEq/L   Glucose, Bld 92  70 - 99 mg/dL   BUN 17  6 - 23 mg/dL   Creatinine, Ser 0.86 (*) 0.50 - 1.10 mg/dL   Calcium 57.8  8.4 - 46.9 mg/dL   Total Protein 7.4  6.0 - 8.3 g/dL   Albumin 3.7  3.5 - 5.2 g/dL   AST 18  0 - 37 U/L   ALT 19  0 - 35 U/L   Alkaline Phosphatase 95  39 - 117 U/L   Total Bilirubin 0.3  0.3 - 1.2 mg/dL   GFR calc non Af Amer 49 (*) >90 mL/min   GFR calc Af Amer 57 (*) >90 mL/min  GLUCOSE, CAPILLARY     Status: Abnormal   Collection Time    09/22/12  6:58 AM      Result Value Range   Glucose-Capillary 141 (*) 70 - 99 mg/dL   Comment 1 Notify RN     Dg Chest 2 View  09/22/2012   *RADIOLOGY REPORT*  Clinical Data: Weakness.  Headache.  CHEST - 2 VIEW  Comparison: Plain film chest 03/08/2009.  Findings: Lungs are clear.  Heart size is normal.  No pneumothorax or pleural fluid.  IMPRESSION: No acute disease.   Original Report Authenticated By: Holley Dexter, M.D.   Ct Head Wo Contrast  09/21/2012   *RADIOLOGY REPORT*  Clinical Data: Acute onset occipital  headache 2 days ago.  History of prior infarct.  CT HEAD WITHOUT CONTRAST  Technique:  Contiguous axial images were obtained from the base of the skull through the vertex without contrast.  Comparison: Brain MRI 08/26/2012 and head CT scan 07/01/2012.  Findings: Remote left PCA territory infarct is identified.  No evidence of acute infarction, hemorrhage, mass lesion, mass effect, midline shift or abnormal excessive fluid collection.  Chronic microvascular ischemic change is  noted.  Imaged paranasal sinuses and mastoid air cells are clear.  IMPRESSION:  1.  No acute abnormality. 2.  Remote left ACA territory infarct and chronic microvascular ischemic change.   Original Report Authenticated By: Holley Dexter, M.D.   Mr Brain Wo Contrast  09/21/2012   *RADIOLOGY REPORT*  Clinical Data:  left-sided weakness  MRI HEAD WITHOUT CONTRAST  Technique:  Multiplanar, multiecho pulse sequences of the brain and surrounding structures were obtained according to standard protocol without intravenous contrast.  Comparison: Prior CT performed earlier on the same day as well as prior MRI from 08/26/2012.  Findings: Small focus of restricted diffusion is present at the junction of the right thalamus and posterior limb of the right internal capsule, in the region of previously identified infarct seen on prior MRI from 08/26/2012. This finding most likely represents a new infarction in this region. There is subtle signal dropout on the corresponding ADC map.  Remote moderate to large size infarct at the medial aspect of the left frontal and parietal lobes and in the anterior communicating artery distribution with associated encephalomalacia is again seen. Secondary ex vacuo dilatation of the left lateral ventricle with Wallerian degeneration again noted.  Remote infarcts involving the left pons, and left globus pallidus are again noted.  No intracranial mass lesions or midline shift.  No extra-axial fluid collection.  IMPRESSION:  1.  Acute ischemic infarct involving the posterior limb of the right internal capsule, at or just adjacent to recently identified acute ischemic infarct seen in this region on MRI from 08/26/2012. 2.  Stable appearance of additional remote infarcts as detailed above.   Original Report Authenticated By: Rise Mu, M.D.    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 Does the need for close, 24 hr/day medical supervision in concert with the patient's rehab needs make it unreasonable for this patient to be served in a less intensive setting? Yes 2. Co-Morbidities requiring supervision/potential complications: Prior CVA causing right foot drop , type 2 diabetes with neuropathy, hypertension 3. Due to bladder management, bowel management, safety, skin/wound care, disease management, medication administration, pain management and patient education, does the patient require 24 hr/day rehab nursing? Yes 4. Does the patient require coordinated care of a physician, rehab nurse, PT (1-2 hrs/day, 5 days/week), OT (1-2 hrs/day, 5 days/week) and SLP (0.5-1 hrs/day, 5 days/week) to address physical and functional deficits in the context of the above medical diagnosis(es)? Yes Addressing deficits in the following areas: balance, endurance, locomotion, strength, transferring, bowel/bladder control, bathing, dressing, feeding, grooming, toileting and cognition 5. Can the patient actively participate in an intensive therapy program of at least 3 hrs of therapy per day at least 5 days per week? Yes 6. The potential for patient to make measurable gains while on inpatient rehab is good 7. Anticipated functional outcomes upon discharge from inpatient rehab are supervision mobility with PT, supervision ADLs with OT, improve attention concentration and memory with SLP. 8. Estimated rehab length of stay to reach the above functional goals is: 10-12 days 9. Does the  patient have adequate social supports to accommodate these discharge functional goals? Potentially 10. Anticipated D/C setting: Home 11. Anticipated post D/C treatments: HH therapy 12. Overall Rehab/Functional Prognosis: good  RECOMMENDATIONS: This patient's condition is appropriate for continued rehabilitative care in the following setting: CIR Patient has agreed to participate in recommended program. Yes Note that insurance prior authorization may be required for reimbursement for recommended care.  Comment:  09/22/2012  

## 2012-09-23 ENCOUNTER — Inpatient Hospital Stay (HOSPITAL_COMMUNITY): Payer: Medicare Other

## 2012-09-23 ENCOUNTER — Inpatient Hospital Stay (HOSPITAL_COMMUNITY): Payer: Medicare Other | Admitting: Speech Pathology

## 2012-09-23 ENCOUNTER — Inpatient Hospital Stay (HOSPITAL_COMMUNITY): Payer: Medicare Other | Admitting: Occupational Therapy

## 2012-09-23 DIAGNOSIS — I6330 Cerebral infarction due to thrombosis of unspecified cerebral artery: Secondary | ICD-10-CM

## 2012-09-23 DIAGNOSIS — G8110 Spastic hemiplegia affecting unspecified side: Secondary | ICD-10-CM

## 2012-09-23 LAB — CBC WITH DIFFERENTIAL/PLATELET
Basophils Absolute: 0 10*3/uL (ref 0.0–0.1)
Eosinophils Relative: 1 % (ref 0–5)
HCT: 34.4 % — ABNORMAL LOW (ref 36.0–46.0)
Lymphocytes Relative: 31 % (ref 12–46)
MCHC: 32.8 g/dL (ref 30.0–36.0)
MCV: 79.3 fL (ref 78.0–100.0)
Monocytes Absolute: 0.5 10*3/uL (ref 0.1–1.0)
RDW: 14.5 % (ref 11.5–15.5)
WBC: 6.8 10*3/uL (ref 4.0–10.5)

## 2012-09-23 LAB — COMPREHENSIVE METABOLIC PANEL
AST: 12 U/L (ref 0–37)
CO2: 23 mEq/L (ref 19–32)
Calcium: 9.4 mg/dL (ref 8.4–10.5)
Creatinine, Ser: 1.11 mg/dL — ABNORMAL HIGH (ref 0.50–1.10)
GFR calc Af Amer: 61 mL/min — ABNORMAL LOW (ref >90)
GFR calc non Af Amer: 53 mL/min — ABNORMAL LOW (ref >90)

## 2012-09-23 LAB — GLUCOSE, CAPILLARY: Glucose-Capillary: 94 mg/dL (ref 70–99)

## 2012-09-23 MED ORDER — ZOLPIDEM TARTRATE 5 MG PO TABS
5.0000 mg | ORAL_TABLET | Freq: Every evening | ORAL | Status: DC | PRN
  Administered 2012-09-23 – 2012-10-04 (×12): 5 mg via ORAL
  Filled 2012-09-23 (×12): qty 1

## 2012-09-23 MED ORDER — ENSURE COMPLETE PO LIQD
237.0000 mL | Freq: Two times a day (BID) | ORAL | Status: DC
  Administered 2012-09-25 – 2012-09-26 (×2): 237 mL via ORAL

## 2012-09-23 NOTE — Progress Notes (Signed)
Patient information reviewed and entered into eRehab system by Keelyn Monjaras, RN, CRRN, PPS Coordinator.  Information including medical coding and functional independence measure will be reviewed and updated through discharge.     Per nursing patient was given "Data Collection Information Summary for Patients in Inpatient Rehabilitation Facilities with attached "Privacy Act Statement-Health Care Records" upon admission.  

## 2012-09-23 NOTE — Evaluation (Addendum)
Physical Therapy Assessment and Plan  Patient Details  Name: Sophia Rodriguez MRN: [id number] Date of Birth: October 15, 1950  PT Diagnosis: Difficulty walking, Hemiparesis non-dominant, Impaired cognition, Impaired sensation and Muscle weakness Rehab Potential: Good ELOS:10-12 days  Today's Date: 09/23/2012 Time: 0900-1000 and 1300-1330 Time Calculation (min): 60 min and 30 min  Problem List:  Patient Active Problem List   Diagnosis Date Noted  . 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 Stroke   . Hypertension     Past Medical History:  Past Medical History  Diagnosis Date  . Stroke   . Hypertension   . Diabetes mellitus    Past Surgical History: History reviewed. No pertinent past surgical history.  Assessment & Plan Clinical Impression:  Sophia Rodriguez is a 62 y.o. right handed female with history of hypertension as well as diabetes mellitus with peripheral neuropathy. Patient history multi-CVA with right-sided weakness residual and recent admission inpatient rehabilitation services discharged 09/10/2012 on Aggrenox therapy for CVA prophylaxis. Patient was using a walker at home living with her daughter. Outpatient therapy had been arranged the patient did not have the necessary transportation. Admitted 09/22/2012 with severe headache and increasing left-sided weakness. MRI of the brain showed acute ischemic infarct posterior limb of the right internal capsule versus extension of previous infarct as well as stable appearance of additional remote infarcts.  Patient transferred to CIR on 09/22/2012 .   Patient currently requires min with mobility secondary to impaired timing and sequencing, unbalanced muscle activation, motor apraxia, decreased coordination and decreased motor planning and decreased initiation, decreased problem solving, decreased safety awareness and delayed processing.  Prior to hospitalization, patient was modified  independent  with mobility and lived with Alone in a Apartment home.  Pt has stated that she felt unsteady and was afraid of falling after recent d/c to home.  Home access is  Level entry.  Patient will benefit from skilled PT intervention to maximize safe functional mobility, minimize fall risk and decrease caregiver burden; therapist recommends discharge to home modified independent for mobility.  Anticipate patient will benefit from follow up HH (OPPT if transportation is arranged) at discharge.  PT - End of Session Activity Tolerance: Tolerates 10 - 20 min activity with multiple rests Endurance Deficit: Yes Endurance Deficit Description: fatigued after ambulation x 87' PT Assessment Rehab Potential: Good Barriers to Discharge: Decreased caregiver support PT Plan PT Intensity: Minimum of 1-2 x/day ,45 to 90 minutes PT Frequency: 5 out of 7 days PT Duration Estimated Length of Stay: 7-10 PT Treatment/Interventions: Ambulation/gait training;Balance/vestibular training;Discharge planning;DME/adaptive equipment instruction;Functional mobility training;Patient/family education;Functional electrical stimulation;Neuromuscular re-education;Psychosocial support;Splinting/orthotics;Stair training;Therapeutic Activities;Therapeutic Exercise;UE/LE Coordination activities;UE/LE Strength taining/ROM;Wheelchair propulsion/positioning PT Recommendation Follow Up Recommendations: HHPT vs OPPT Equipment Recommended: None recommended by PT Equipment Details: owns R AFO, w/c, Rw  Skilled Therapeutic Intervention 1st treatment: neuromuscular re-education via tactile, VCs, manual cues for balance retraining in standing, gait.  With repetitive internal and external perturbations backwards, pt began to elicit L ankle strategy, and occasionally bil hip strategy  Gait with RW x 100' with min assist on level tile, focusing on wt shifting to L, clearing R foot, longer R step length.  2nd treatment:  neuromuscular  re-education via forced use, demo, VCs for bil LE alternating extension of Kinetron at 20-30 cm/sec in high sitting and standing, working on full wt shift with extension, and during gait  Gait with RW, RAFO x 120' with min assist, focusing on L stance time in order  to clear R foot, R swing phase, upright trunk, forward gaze, BOS during turns.  PT Evaluation Precautions/Restrictions Precautions Precautions: Fall Precaution Comments: pt went home from CIR and does not note falls but did note feeling uneasy at home Required Braces or Orthoses: Other Brace/Splint Other Brace/Splint: right AFO General   Vital SignsTherapy Vitals Temp: 97.6 F (36.4 C) Temp src: Oral Pulse Rate: 76 Resp: 18 BP: 109/62 mmHg Patient Position, if appropriate: Sitting Oxygen Therapy SpO2: 100 % O2 Device: None (Room air) Dizzy upon sitting up from flexed position while donning shoes  Pain Pain Assessment Pain Assessment: No/denies pain Pain Score: 0-No pain Home Living/Prior Functioning Home Living Available Help at Discharge: Available PRN/intermittently;Family;Other (Comment) (pt states she will go to SNF PRN) Type of Home: Apartment Home Access: Level entry Home Layout: One level Home Equipment: Walker - 2 wheels;Cane - single point;Wheelchair - manual Additional Comments: Used a walker at home after d/c from CIR in September 10, 2012.  She reports that she did not feel safe at home by herself and sometimes her foot would get caught on the carpet.  Lives With: Alone Prior Function Level of Independence: Requires assistive device for independence;Needs assistance with homemaking Driving: No Vocation: On disability Leisure: Hobbies-yes (Comment) (sewing, cooking, reading previously; none now) Comments: discharged at Mod I level 6/20 from CIR. Daughter stated unable to Akbar Sacra and maintain home which daughter provided intermittent assist 2 to 3 hrs per day Vision/Perception  Vision - History Baseline  Vision: Bifocals Patient Visual Report: Other (comment) (has difficulty focusing)  Cognition Overall Cognitive Status: Impaired/Different from baseline Arousal/Alertness: Awake/alert Orientation Level: Oriented X4 Sensation Sensation Light Touch: Appears Intact (bil LEs) Proprioception: Impaired by gross assessment (L ankle; does have kinesthesia) Coordination Gross Motor Movements are Fluid and Coordinated: No Fine Motor Movements are Fluid and Coordinated: No Heel Shin Test: decreased speed RLE; decreased speed and excursion LLE Motor  Motor Motor: Hemiplegia  Mobility Transfers Sit to Stand: 5: Supervision Stand to Sit Details (indicate cue type and reason): Verbal cues for technique Stand Pivot Transfers: 4: Min assist Stand Pivot Transfer Details: Manual facilitation for weight shifting;Verbal cues for technique Locomotion  Ambulation Ambulation: Yes Ambulation/Gait Assistance: 4: Min assist Ambulation Distance (Feet): 87 Feet Assistive device:  (R AFO, without AD) Ambulation/Gait Assistance Details: Manual facilitation for weight shifting;Verbal cues for technique Gait Gait: Yes Gait Pattern: Impaired Gait Pattern: Step-to pattern;Decreased step length - right;Narrow base of support;Decreased dorsiflexion - right;Decreased stance time - left;Decreased weight shift to right;Decreased weight shift to left (R foot catches with each step; RLE ER at hip) Gait velocity: reduced Stairs / Additional Locomotion Stairs: Yes Stairs Assistance: 4: Min guard Stairs Assistance Details: Verbal cues for technique (lead with R foot ascending and descending) Stair Management Technique: Two rails Number of Stairs: 5 Height of Stairs: 7 Wheelchair Mobility Wheelchair Mobility: Yes Wheelchair Assistance: 4: Min Education officer, museum: Both lower extermities Wheelchair Parts Management: Needs assistance Distance: 25  Trunk/Postural Assessment  Cervical Assessment Cervical  Assessment: Within Film/video editor Assessment: Within Functional Limits Lumbar Assessment Lumbar Assessment: Within Functional Limits Postural Control Postural Control: Deficits on evaluation Protective Responses: absent R ankle , bil hip and stepping strategies, absent UE reactions during LOB backwards  Balance Static Standing Balance Static Standing - Balance Support: No upper extremity supported Static Standing - Level of Assistance: 5: Stand by assistance Dynamic Standing Balance Dynamic Standing - Level of Assistance: 4: Min assist Dynamic Standing - Balance Activities: Other (  comment) (mild perturbations from therapist) Dynamic Standing - Comments: internal perturbations and external perturbations posteriorly result in LOB with assistance needed to prevent fall backwards Extremity Assessment  RUE Assessment RUE Assessment: Exceptions to [facility] RUE AROM (degrees) RUE Overall AROM Comments: AROM WFL except end ranges of shoulder movements due to pain. RUE Strength RUE Overall Strength Comments: Grossly 4-/5, grip strength is functional LUE Assessment LUE Assessment: Exceptions to [facility] LUE Strength LUE Overall Strength Comments: Shoulder strength unable to assess due to shoulder pain with MMT, grossly 3+/5 for shoulder and 4-/5-4/5 distal to shoulder Gross Grasp: Impaired (weakness as compared to R grasp) RLE Assessment RLE Assessment: Exceptions to [facility] RLE Strength RLE Overall Strength Comments: grossly hip and knee 4/5; ankle DF 3-/5 LLE Assessment LLE Assessment: Exceptions to [facility] LLE Strength LLE Overall Strength: Deficits LLE Overall Strength Comments: grossly hip 4/5 and ankle DF; knee 4-/5  FIM:  FIM - Bed/Chair Transfer Bed/Chair Transfer Assistive Devices: Arm rests;Walker Bed/Chair Transfer: 6: Supine > Sit: No assist;3: Bed > Chair or W/C: Mod A (lift or lower assist) FIM - Locomotion: Wheelchair Distance: 25 FIM - Locomotion:  Ambulation Ambulation/Gait Assistance: 4: Min assist   Refer to Care Plan for Long Term Goals  Recommendations for other services: None  Discharge Criteria: Patient will be discharged from PT if patient refuses treatment 3 consecutive times without medical reason, if treatment goals not met, if there is a change in medical status, if patient makes no progress towards goals or if patient is discharged from hospital.  The above assessment, treatment plan, treatment alternatives and goals were discussed and mutually agreed upon: by patient  Sophia Rodriguez 09/23/2012, 10:30 AM

## 2012-09-23 NOTE — Evaluation (Signed)
Speech Language Pathology Assessment and Plan  Patient Details  Name: Sophia Rodriguez MRN: [id number] Date of Birth: 10/05/1950  SLP Diagnosis:  (n/a)  Rehab Potential:  (defer OT/PT) ELOS:  (defer OT/PT)  Today's Date: 09/23/2012 Time: 9629-5284 Time Calculation (min): 45 min  Problem List:  Patient Active Problem List   Diagnosis Date Noted  . 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 Stroke   . Hypertension    Past Medical History:  Past Medical History  Diagnosis Date  . Stroke   . Hypertension   . Diabetes mellitus    Past Surgical History: History reviewed. No pertinent past surgical history.  Assessment / Plan / Recommendation Clinical Impression  Sophia Rodriguez is a 62 y.o. right handed female with history of hypertension as well as diabetes mellitus with peripheral neuropathy. Patient history multi-CVA with right-sided weakness residual and recent admission inpatient rehabilitation services discharged 09/10/2012 on Aggrenox therapy for CVA prophylaxis. Patient was using a walker at home. Outpatient therapy had been arranged the patient did not have the necessary transportation. Admitted 09/22/2012 with severe headache and increasing left-sided weakness. MRI of the brain showed acute ischemic infarct posterior limb of the right internal capsule versus extension of previous infarct as well as stable appearance of additional remote infarcts. Patient did not receive TPA. Neurology service followup presently on aspirin therapy with the addition of subcutaneous Lovenox for DVT prophylaxis and full workup ongoing. Patient maintained on a regular consistency diet. Physical and occupational therapy evaluations completed. M.D. Has requested physical medicine rehabilitation consult consider inpatient rehabilitation services. Patient felt to be a good candidate for inpatient rehabilitation services and was admitted for comprehensive  rehabilitation program 09/22/12.  Cognitive-Linguistic evaluation completed and patient presents with intact cognition.  Patient demonstrates mildly delayed processing, but has since initial CVA.  Cognition will not prevent patient from discharging to home, as she is able to complete tasks needed for household management.  As a result, no skilled SLP services are warranted during CIR stay.      SLP Assessment  Patient does not need any further Speech Lanaguage Pathology Services    Recommendations  Recommendations for Other Services:  (defer OT/PT) Patient destination:  (defer OT/PT) Follow up Recommendations: None Equipment Recommended: None recommended by SLP               Pain Pain Assessment Pain Assessment: No/denies pain Pain Score: 0-No pain Prior Functioning Cognitive/Linguistic Baseline: Within functional limits (slowed processing) Type of Home: Apartment  Lives With: Alone Available Help at Discharge: Available PRN/intermittently;Family;Other (Comment) (pt states she will go to SNF PRN) Education: one year of grad school, 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  The above assessment, treatment plan, treatment alternatives and goals were discussed and mutually agreed upon: by patient  Fae Pippin, M.A., CCC-SLP [phone]  Elisandro Jarrett 09/23/2012, 11:58 AM

## 2012-09-23 NOTE — Progress Notes (Signed)
INITIAL NUTRITION ASSESSMENT  DOCUMENTATION CODES Per approved criteria  -Not Applicable   INTERVENTION: 1.  Ensure Complete po BID, each supplement provides 350 kcal and 13 grams of protein.  NUTRITION DIAGNOSIS: Inadequate oral intake related to poor appetite as evidenced by pt report.  Monitor:  1.  Food/Beverage; pt meeting >/=90% estimated needs with tolerance. 2.  Wt/wt change; monitor trends  Reason for Assessment: MST  62 y.o. female  Admitting Dx: Thrombotic cerebral infarction  ASSESSMENT: Pt admitted with Thrombotic right thalami and right PICA infarct with history of multiple CVA. RD met with pt to discuss intake.  Pt endorses poor appetite and decreased intake.  She denies additional nutrition-related complaints.  Pt states that it is usual for her to experience these changes in appetite and she has not experienced any wt change. RD offered supplement BID to assist in meeting needs and pt accepts.    Height: Ht Readings from Last 1 Encounters:  09/22/12 5\' 6"  (1.676 m)    Weight: Wt Readings from Last 1 Encounters:  09/22/12 162 lb 14.4 oz (73.891 kg)    Ideal Body Weight: 130 lbs  % Ideal Body Weight: 124%  Wt Readings from Last 10 Encounters:  09/22/12 162 lb 14.4 oz (73.891 kg)  09/22/12 164 lb 14.4 oz (74.798 kg)  09/10/12 167 lb 3.2 oz (75.841 kg)  08/26/12 165 lb (74.844 kg)    Usual Body Weight: 160-165 lbs  % Usual Body Weight: 100%  BMI:  Body mass index is 26.31 kg/(m^2).  Estimated Nutritional Needs: Kcal: 1845-2060 Protein: 73-87g Fluid: ~2.0 L/day  Skin: intact  Diet Order: Carb Control  EDUCATION NEEDS: -Education needs addressed   Intake/Output Summary (Last 24 hours) at 09/23/12 1559 Last data filed at 09/23/12 1300  Gross per 24 hour  Intake    600 ml  Output      0 ml  Net    600 ml    Last BM: 7/2   Labs:   Recent Labs Lab 09/21/12 2039 09/22/12 1828 09/23/12 0600  NA 139  --  140  K 4.4  --  3.8   CL 104  --  107  CO2 22  --  23  BUN 17  --  18  CREATININE 1.18* 1.07 1.11*  CALCIUM 10.0  --  9.4  GLUCOSE 92  --  96    CBG (last 3)   Recent Labs  09/22/12 2045 09/23/12 0733 09/23/12 1124  GLUCAP 113* 94 99    Scheduled Meds: . aspirin  300 mg Rectal Daily   Or  . aspirin  325 mg Oral Daily  . atorvastatin  40 mg Oral q1800  . citalopram  10 mg Oral Daily  . enoxaparin (LOVENOX) injection  40 mg Subcutaneous Q24H  . insulin aspart  0-9 Units Subcutaneous TID WC  . multivitamin with minerals  1 tablet Oral Daily    Continuous Infusions:   Past Medical History  Diagnosis Date  . Stroke   . Hypertension   . Diabetes mellitus     History reviewed. No pertinent past surgical history.  Loyce Dys, MS RD LDN Clinical Inpatient Dietitian Pager: [phone] Weekend/After hours pager: [phone]

## 2012-09-23 NOTE — Progress Notes (Signed)
Social Work Assessment and Plan Social Work Assessment and Plan  Patient Details  Name: Sophia Rodriguez MRN: [id number] Date of Birth: 04-01-1950  Today's Date: 09/23/2012  Problem List:  Patient Active Problem List   Diagnosis Date Noted  . 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 Stroke   . Hypertension    Past Medical History:  Past Medical History  Diagnosis Date  . Stroke   . Hypertension   . Diabetes mellitus    Past Surgical History: History reviewed. No pertinent past surgical history. Social History:  reports that she quit smoking about 5 years ago. She does not have any smokeless tobacco history on file. She reports that she does not drink alcohol or use illicit drugs.  Family / Support Systems Marital Status: Single Patient Roles: Parent Children: Evel Drum-Gould  [phone] Other Supports: Son in [place] Anticipated Caregiver: Self and daughter intermittently Ability/Limitations of Caregiver: Daughter works two jobs so come by once a day Medical laboratory scientific officer: Intermittent Family Dynamics: Close with children see's daughter daily and trys to see son as often as possible.  Pt has limited supports-daughter does what she can but is stretched with other responsibilities  Social History Preferred language: English Religion: Baptist Cultural Background: No issues Education: [employer] Educated Read: Yes Write: Yes Employment Status: Disabled Fish farm manager Issues: No issues Guardian/Conservator: None-according to MD pt is capable of making her own decisions-relies much on daughter also to be included   Abuse/Neglect Physical Abuse: Denies Verbal Abuse: Denies Sexual Abuse: Denies Exploitation of patient/patient's resources: Denies Self-Neglect: Denies  Emotional Status Pt's affect, behavior adn adjustment status: Pt is motviated but this is her third stroke.  She is smiling and trying to be  bright, but at times comes across as flat.  She is very private and keeps her thoughts mostly to herself.  Will provide support while here. Recent Psychosocial Issues: Multiple CVA's and the deficits as a result of this.  Limited social supports Pyschiatric History: Hisotry of depression takes meds for this and feels it is helping her. Offered Neuro-psych last admission and pt felt somewhat helpful but does not feel will need now.  She has a strong faith and this is what pulss her through.  Will monitor wile here. Substance Abuse History: No issues  Patient / Family Perceptions, Expectations & Goals Pt/Family understanding of illness & functional limitations: Pt and daughter have a good understanding of her condition, she is aware of her deficits this time.  She is hopeful she will do well here and return home.  She is happy to be on rehab familar with unit and the processes. She wants to avoid SNF. Premorbid pt/family roles/activities: Mother, Retiree, Church member, friend, etc Anticipated changes in roles/activities/participation: resume Pt/family expectations/goals: Pt states: " I want to get back to using a walker or nothing at home."  " I don't want to go to a nursing home again."  [employer]: Other (Comment) ([facility]AT pending) Premorbid Home Care/DME Agencies: Other (Comment) ([facility] re[facility]) Transportation available at discharge: Daughter and bus, [facility] application pending have not had interview yet, scheduled for 11/02/2012 Resource referrals recommended: Support group (specify) (CVA Support group)  Discharge Planning Living Arrangements: Alone Support Systems: Children;Church/faith community;Friends/neighbors Type of Residence: Private residence Insurance Resources: Administrator (specify) Herbalist) Financial Resources: SSD Financial Screen Referred: No Living Expenses: Rent Money Management: Patient;Family Does the patient have any problems  obtaining your medications?: No Home Management: Self  and daughter helps some Patient/Family Preliminary Plans: Return home with intermittent assist from her daughter.  She needs to be mod/i to return home safely.  Disucssed mobile meals and home health services, pt to think about. Social Work Anticipated Follow Up Needs: HH/OP;Support Group  Clinical Impression Pleasant female who's plan is to return home no matter what, she does not plan to go to a NH at discharge.  Her daughter is supportive but can only do so much for pt. Will try to connect with as many community agencies as eligible to make her discharge successful.  Lucy Chris 09/23/2012, 2:09 PM

## 2012-09-23 NOTE — Progress Notes (Signed)
Patient ID: Vickye Astorino, female   DOB: 10-20-1950, 62 y.o.   MRN: [id number] Subjective/Complaints: 62 y.o. 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 She was admitted on 08/26/12 with 24 hour history of difficulty walking, decrease in balance as well as increased right sided weakness. MRI of the brain showed acute nonhemorrhagic infarct at the junction of the right thalamus and posterior limb of the right internal capsule as well as remote moderate to large size infarct medial aspect left frontal and parietal lobes as well as left globus pallidus and remote left paracentral small infarct. MRA of the head with atherosclerotic type changes. MRA of the neck without significant stenosis. Echocardiogram with EF 60-65% and source of emboli. Carotid Dopplers with no ICA stenosis. Neurology recommended changing patient to aggrenox for thrombotic stroke due to SVD Extension of R thalamic and PLIC infarct 7/1 Slept ok  No pain Slept poorly requesting Ambien Review of Systems  Neurological: Positive for focal weakness.  All other systems reviewed and are negative.    Objective: Vital Signs: Blood pressure 100/67, pulse 84, temperature 98.2 F (36.8 C), temperature source Oral, resp. rate 17, height 5\' 6"  (1.676 m), weight 73.891 kg (162 lb 14.4 oz), SpO2 94.00%. Dg Chest 2 View  09/22/2012   *RADIOLOGY REPORT*  Clinical Data: Weakness.  Headache.  CHEST - 2 VIEW  Comparison: Plain film chest 03/08/2009.  Findings: Lungs are clear.  Heart size is normal.  No pneumothorax or pleural fluid.  IMPRESSION: No acute disease.   Original Report Authenticated By: Holley Dexter, M.D.   Ct Head Wo Contrast  09/21/2012   *RADIOLOGY REPORT*  Clinical Data: Acute onset occipital headache 2 days ago.  History of prior infarct.  CT HEAD WITHOUT CONTRAST  Technique:  Contiguous axial  images were obtained from the base of the skull through the vertex without contrast.  Comparison: Brain MRI 08/26/2012 and head CT scan 07/01/2012.  Findings: Remote left PCA territory infarct is identified.  No evidence of acute infarction, hemorrhage, mass lesion, mass effect, midline shift or abnormal excessive fluid collection.  Chronic microvascular ischemic change is noted.  Imaged paranasal sinuses and mastoid air cells are clear.  IMPRESSION:  1.  No acute abnormality. 2.  Remote left ACA territory infarct and chronic microvascular ischemic change.   Original Report Authenticated By: Holley Dexter, M.D.   Mr Brain Wo Contrast  09/21/2012   *RADIOLOGY REPORT*  Clinical Data:  left-sided weakness  MRI HEAD WITHOUT CONTRAST  Technique:  Multiplanar, multiecho pulse sequences of the brain and surrounding structures were obtained according to standard protocol without intravenous contrast.  Comparison: Prior CT performed earlier on the same day as well as prior MRI from 08/26/2012.  Findings: Small focus of restricted diffusion is present at the junction of the right thalamus and posterior limb of the right internal capsule, in the region of previously identified infarct seen on prior MRI from 08/26/2012. This finding most likely represents a new infarction in this region. There is subtle signal dropout on the corresponding ADC map.  Remote moderate to large size infarct at the medial aspect of the left frontal and parietal lobes and in the anterior communicating artery distribution with associated encephalomalacia is again seen. Secondary ex vacuo dilatation of the left lateral ventricle with Wallerian degeneration again noted.  Remote infarcts involving the left pons, and left globus pallidus are again noted.  No intracranial  mass lesions or midline shift.  No extra-axial fluid collection.  IMPRESSION: 1.  Acute ischemic infarct involving the posterior limb of the right internal capsule, at or just  adjacent to recently identified acute ischemic infarct seen in this region on MRI from 08/26/2012. 2.  Stable appearance of additional remote infarcts as detailed above.   Original Report Authenticated By: Rise Mu, M.D.   Results for orders placed during the hospital encounter of 09/22/12 (from the past 72 hour(s))  CBC     Status: Abnormal   Collection Time    09/22/12  6:28 PM      Result Value Range   WBC 7.8  4.0 - 10.5 K/uL   RBC 4.43  3.87 - 5.11 MIL/uL   Hemoglobin 11.7 (*) 12.0 - 15.0 g/dL   HCT 29.5 (*) 62.1 - 30.8 %   MCV 79.0  78.0 - 100.0 fL   MCH 26.4  26.0 - 34.0 pg   MCHC 33.4  30.0 - 36.0 g/dL   RDW 65.7  84.6 - 96.2 %   Platelets 281  150 - 400 K/uL  CREATININE, SERUM     Status: Abnormal   Collection Time    09/22/12  6:28 PM      Result Value Range   Creatinine, Ser 1.07  0.50 - 1.10 mg/dL   GFR calc non Af Amer 55 (*) >90 mL/min   GFR calc Af Amer 64 (*) >90 mL/min   Comment:            The eGFR has been calculated     using the CKD EPI equation.     This calculation has not been     validated in all clinical     situations.     eGFR's persistently     <90 mL/min signify     possible Chronic Kidney Disease.  GLUCOSE, CAPILLARY     Status: Abnormal   Collection Time    09/22/12  8:45 PM      Result Value Range   Glucose-Capillary 113 (*) 70 - 99 mg/dL  CBC WITH DIFFERENTIAL     Status: Abnormal   Collection Time    09/23/12  6:00 AM      Result Value Range   WBC 6.8  4.0 - 10.5 K/uL   RBC 4.34  3.87 - 5.11 MIL/uL   Hemoglobin 11.3 (*) 12.0 - 15.0 g/dL   HCT 95.2 (*) 84.1 - 32.4 %   MCV 79.3  78.0 - 100.0 fL   MCH 26.0  26.0 - 34.0 pg   MCHC 32.8  30.0 - 36.0 g/dL   RDW 40.1  02.7 - 25.3 %   Platelets 273  150 - 400 K/uL   Neutrophils Relative % 60  43 - 77 %   Neutro Abs 4.1  1.7 - 7.7 K/uL   Lymphocytes Relative 31  12 - 46 %   Lymphs Abs 2.1  0.7 - 4.0 K/uL   Monocytes Relative 8  3 - 12 %   Monocytes Absolute 0.5  0.1 - 1.0 K/uL    Eosinophils Relative 1  0 - 5 %   Eosinophils Absolute 0.1  0.0 - 0.7 K/uL   Basophils Relative 0  0 - 1 %   Basophils Absolute 0.0  0.0 - 0.1 K/uL  COMPREHENSIVE METABOLIC PANEL     Status: Abnormal   Collection Time    09/23/12  6:00 AM      Result Value Range  Sodium 140  135 - 145 mEq/L   Potassium 3.8  3.5 - 5.1 mEq/L   Chloride 107  96 - 112 mEq/L   CO2 23  19 - 32 mEq/L   Glucose, Bld 96  70 - 99 mg/dL   BUN 18  6 - 23 mg/dL   Creatinine, Ser 4.69 (*) 0.50 - 1.10 mg/dL   Calcium 9.4  8.4 - 62.9 mg/dL   Total Protein 6.7  6.0 - 8.3 g/dL   Albumin 3.5  3.5 - 5.2 g/dL   AST 12  0 - 37 U/L   ALT 13  0 - 35 U/L   Alkaline Phosphatase 79  39 - 117 U/L   Total Bilirubin 0.3  0.3 - 1.2 mg/dL   GFR calc non Af Amer 53 (*) >90 mL/min   GFR calc Af Amer 61 (*) >90 mL/min   Comment:            The eGFR has been calculated     using the CKD EPI equation.     This calculation has not been     validated in all clinical     situations.     eGFR's persistently     <90 mL/min signify     possible Chronic Kidney Disease.  GLUCOSE, CAPILLARY     Status: None   Collection Time    09/23/12  7:33 AM      Result Value Range   Glucose-Capillary 94  70 - 99 mg/dL   Comment 1 Notify RN        Nursing note and vitals reviewed.  Constitutional: She is oriented to person, place, and time. She appears well-developed and well-nourished.  HENT:  Head: Normocephalic and atraumatic.  Eyes: Pupils are equal, round, and reactive to light.  Neck: Normal range of motion. Neck supple.  Cardiovascular: Normal rate and regular rhythm.  Pulmonary/Chest: Effort normal and breath sounds normal. No respiratory distress. She has no wheezes.  Abdominal: Soft. Bowel sounds are normal.  Musculoskeletal: She exhibits no edema and no tenderness. Normal appearing arches, feet--non tender with ROM/palpation Neurological: She is alert and oriented to person, place, and time.  Soft spoken. Speech clear. Some  delay in initiation but able to followcommands without difficulty. LUE weakness with decrease in fine motor control. RLE with minimal weakness.  Skin: Skin is warm and dry.  Psychiatric: Her speech is normal and behavior is normal. Thought content normal. Her mood appears a little flat. Cognition and memory are normal.  Motor strength: 4/5 in the right deltoid, biceps, triceps, grip, hip flexor, knee extensors, 3-/5 ankle dorsiflexor plantar flexor  4 minus/5 in the left deltoid, biceps, triceps, grip, hip flexor, knee extensors, 1/5 ankle dorsiflexor and plantar flexor  Decreased sensory Left great toe  Cerebellar testing no evidence of ataxia on finger nose to finger and heel-to-shin testing are   Assessment/Plan: 1. 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                                Medical Problem List and Plan:  1. DVT Prophylaxis/Anticoagulation: Pharmaceutical: Lovenox  2. Pain Management: Will use tylenol prn for pain. Added voltaren gel for left wrist pain. --controlled at present 3. Mood: Flat  affect with underlying anxiety noted. Will continue celexa and klonopin. Needs ego support. Will have LCSW follow up for evaluation.  4. Neuropsych: This patient is capable of making decisions on his/her own behalf.  5. DM type 2 with neuropathy: Monitor BS with AC/HS checks. Off metformin. Under control  -follow for increased pain---minor at present. 6. HTN: Will monitor with bid checks. on low dose ace for better control will close monitoring for hypotension. .  7. Dyslipidemia: LDL--128.   Lipitor  . 8.  Left foot drop new, prob secondary to CVA, also has DM with neuropathy      LOS (Days) 1 A FACE TO FACE EVALUATION WAS PERFORMED  Lucky Trotta  E 09/23/2012, 8:53 AM

## 2012-09-23 NOTE — Evaluation (Signed)
Occupational Therapy Assessment and Plan  Patient Details  Name: Sophia Rodriguez MRN: [id number] Date of Birth: 1950-09-04  OT Diagnosis: muscle weakness (generalized), pain in left shoulder joint with P/AROM and hemiparesis of non-dominant left side Rehab Potential: Rehab Potential: Good ELOS: 10-12 days   Today's Date: 09/23/2012 Time: 0800-0900 Time Calculation (min): 60 min  Problem List:  Patient Active Problem List   Diagnosis Date Noted  . 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 Stroke   . Hypertension     Past Medical History:  Past Medical History  Diagnosis Date  . Stroke   . Hypertension   . Diabetes mellitus    Past Surgical History: History reviewed. No pertinent past surgical history.  Assessment & Plan Clinical Impression: 62 y.o. right handed female with history of hypertension as well as diabetes mellitus with peripheral neuropathy. Patient history multi-CVA with right-sided weakness residual and recent admission inpatient rehabilitation services discharged 09/10/2012 on Aggrenox therapy for CVA prophylaxis. Patient was using a walker at home. Outpatient therapy had been arranged the patient did not have the necessary transportation. Admitted 09/22/2012 with severe headache and increasing left-sided weakness. MRI of the brain showed acute ischemic infarct posterior limb of the right internal capsule versus extension of previous infarct as well as stable appearance of additional remote infarcts. Patient did not receive TPA. Neurology service followup presently on aspirin therapy with the addition of subcutaneous Lovenox for DVT prophylaxis and full workup ongoing. Patient maintained on a regular consistency diet.  Patient transferred to CIR on 09/22/2012.  Patient currently requires min-mod with basic self-care skills and IADL secondary to muscle weakness and muscle joint tightness, decreased coordination and  decreased standing balance, hemiparesis and decreased balance strategies.  Prior to hospitalization, was Mod I with walker in her home except for IADL tasks due to "Just too tired so my daughter came in for 2-3 hours a day to do these things".  Patient also stated that she felt uneasy at home due to decreased ability to move her RLE because it would get stuck.  Patient will benefit from skilled intervention to increase independence with basic self-care skills and increase level of independence with iADL prior to discharge home independently.  Anticipate patient will require intermittent supervision and follow up home health.  Skilled Therapeutic Intervention OT evaluation and self care retraining to include sponge bath (too tired to shower today), dress and groom.  Focused session on activity tolerance, weight shifts in standing to unweight foot so can move it, increased use of LUE during BADL tasks.  OT Evaluation Precautions/Restrictions  Precautions Precautions: Fall Precaution Comments: pt went home from CIR and does not note falls but did note feeling uneasy at home Required Braces or Orthoses: Other Brace/Splint Other Brace/Splint: right AFO Pain Pain Assessment Pain Assessment: No/denies pain Home Living/Prior Functioning Home Living Available Help at Discharge: Available PRN/intermittently;Family;Other (Comment) (pt states she will go to SNF PRN) Type of Home: Apartment Home Access: Level entry Home Layout: One level Home Equipment: Walker - 2 wheels;Cane - single point;Wheelchair - manual Additional Comments: Used a walker at home after d/c from CIR in September 10, 2012.  She reports that she did not feel safe at home by herself and sometimes her foot would get caught on the carpet.  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 Daughter stated unable to cook and maintain home which daughter provided intermittent assist 2 to 3 hrs per day ADL Refer to FIM for details Vision/Perception  Wears glasses, reports vision not as clear, TBA further. Cognition Overall Cognitive Status: Within Functional Limits for tasks assessed Arousal/Alertness: Awake/alert Orientation Level: Oriented X4 Attention: Sustained Sustained Attention: Appears intact Selective Attention: Appears intact Memory: Appears intact Awareness: Appears intact Problem Solving: Appears intact Problem Solving Impairment: Verbal complex;Functional complex Safety/Judgment: Appears intact Sensation Sensation Additional Comments: BUEs TBA Coordination Gross Motor Movements are Fluid and Coordinated: No Fine Motor Movements are Fluid and Coordinated: No Coordination and Movement Description: Decreased speed, accuracy and rhythm with FTN, rapid alternating movements and isolated finger movements. Motor  Motor Motor: Hemiplegia Mobility  Transfers Sit to Stand: 5: Supervision Stand to Sit Details (indicate cue type and reason): Verbal cues for technique  Trunk/Postural Assessment  Cervical Assessment Cervical Assessment: Within Functional Limits Thoracic Assessment Thoracic Assessment: Within Functional Limits Lumbar Assessment Lumbar Assessment: Within Functional Limits Postural Control Postural Control: Deficits on evaluation Protective Responses: absent R ankle , bil hip and stepping strategies, absent UE reactions during LOB backwards  Balance Static Standing Balance Static Standing - Balance Support: No upper extremity supported Static Standing - Level of Assistance: 5: Stand by  assistance Dynamic Standing Balance Dynamic Standing - Level of Assistance: 4: Min assist Dynamic Standing - Balance Activities: Other (comment) (mild perturbations from therapist) Extremity/Trunk Assessment RUE Assessment RUE Assessment: Exceptions to [facility] RUE AROM (degrees) RUE Overall AROM Comments: AROM WFL except end ranges of shoulder movements due to pain. RUE Strength RUE Overall Strength Comments: Grossly 4-/5, grip strength is functional LUE Assessment LUE Assessment: Exceptions to [facility] LUE Strength LUE Overall Strength Comments: Shoulder strength unable to assess due to shoulder pain with MMT, grossly 3+/5 for shoulder and 4-/5-4/5 distal to shoulder Gross Grasp: Impaired (weakness as compared to R grasp)  FIM:  FIM - Eating Eating Activity: 6: More than reasonable amount of time FIM - Grooming Grooming Steps: Wash, rinse, dry face;Wash, rinse, dry hands Grooming: 4: Patient completes 3 of 4 or 4 of 5 steps FIM - Bathing Bathing Steps Patient Completed: Chest;Right Arm;Left Arm;Abdomen;Front perineal area;Buttocks;Right upper leg;Left upper leg;Right lower leg (including foot);Left lower leg (including foot) Bathing: 4: Steadying assist FIM - Upper Body Dressing/Undressing Upper body dressing/undressing steps patient completed: Thread/unthread right sleeve of pullover shirt/dresss;Thread/unthread left sleeve of pullover shirt/dress;Put head through opening of pull over shirt/dress;Pull shirt over trunk (no bra her on eval) Upper body dressing/undressing: 5: Set-up assist to: Obtain clothing/put away FIM - Lower Body Dressing/Undressing Lower body dressing/undressing steps patient completed: Thread/unthread right underwear leg;Thread/unthread left underwear leg;Pull underwear up/down;Thread/unthread right pants leg;Thread/unthread left pants leg;Pull pants up/down;Don/Doff right sock;Don/Doff left sock;Don/Doff right shoe;Don/Doff left shoe;Fasten/unfasten right  shoe;Fasten/unfasten left shoe Lower body dressing/undressing: 4: Steadying Assist FIM - Banker Devices: Arm rests;Walker Bed/Chair Transfer: 6: Supine > Sit: No assist;3: Bed > Chair or W/C: Mod A (lift or lower assist) FIM - Tub/Shower Transfers Tub/shower Transfers: 0-Activity did not occur or was simulated (patient declined (too tired))   Refer to Care Plan for Long Term Goals  Recommendations for other services: None  Discharge Criteria: Patient will be discharged from OT if patient refuses treatment 3 consecutive times  without medical reason, if treatment goals not met, if there is a change in medical status, if patient makes no progress towards goals or if patient is discharged from hospital.  The above assessment, treatment plan, treatment alternatives and goals were discussed and mutually agreed upon: by patient  Corisa Montini 09/23/2012, 4:18 PM

## 2012-09-23 NOTE — Care Management Note (Signed)
[facility] Individual Statement of Services  Patient Name:  Sophia Rodriguez  Date:  09/23/2012  Welcome to the [facility].  Our goal is to provide you with an individualized program based on your diagnosis and situation, designed to meet your specific needs.  With this comprehensive rehabilitation program, you will be expected to participate in at least 3 hours of rehabilitation therapies Monday-Friday, with modified therapy programming on the weekends.  Your rehabilitation program will include the following services:  Physical Therapy (PT), Occupational Therapy (OT), Speech Therapy (ST), 24 hour per day rehabilitation nursing, Neuropsychology, Case Management ( Social Worker), Rehabilitation Medicine, Nutrition Services and Pharmacy Services  Weekly team conferences will be held on Wednesday to discuss your progress.  Your Social Worker will talk with you frequently to get your input and to update you on team discussions.  Team conferences with you and your family in attendance may also be held.  Expected length of stay: 10-12 days Overall anticipated outcome: mod/i level  Depending on your progress and recovery, your program may change. Your Social Worker will coordinate services and will keep you informed of any changes. Your Child psychotherapist names and contact numbers are listed  below.  The following services may also be recommended but are not provided by the [facility]:  Home Health Rehabiltiation Services  Outpatient Rehabilitatation Servives    Arrangements will be made to provide these services after discharge if needed.  Arrangements include referral to agencies that provide these services.  Your insurance has been verified to be:  Medicare & BCBS Your primary doctor is:  Dr Concepcion Elk  Pertinent information will be shared with your doctor and your insurance company.  Social Worker:  Dossie Der, [place]  [phone]  Information discussed with and copy given to patient by: Lucy Chris, 09/23/2012, 1:54 PM

## 2012-09-24 ENCOUNTER — Inpatient Hospital Stay (HOSPITAL_COMMUNITY): Payer: Medicare Other | Admitting: Occupational Therapy

## 2012-09-24 ENCOUNTER — Inpatient Hospital Stay (HOSPITAL_COMMUNITY): Payer: Medicare Other | Admitting: Physical Therapy

## 2012-09-24 ENCOUNTER — Inpatient Hospital Stay (HOSPITAL_COMMUNITY): Payer: Medicare Other | Admitting: *Deleted

## 2012-09-24 LAB — GLUCOSE, CAPILLARY
Glucose-Capillary: 90 mg/dL (ref 70–99)
Glucose-Capillary: 107 mg/dL — ABNORMAL HIGH (ref 70–99)

## 2012-09-24 NOTE — Progress Notes (Signed)
Physical Therapy Session Note  Patient Details  Name: Sophia Rodriguez MRN: [id number] Date of Birth: May 24, 1950  Today's Date: 09/24/2012 Time: 9:32-10:30 ( )   Short Term Goals: Week 1:  PT Short Term Goal 1 (Week 1): = LTGs  Skilled Therapeutic Interventions/Progress Updates:  Tx focused on functional mobility, gait training in controlled environment, and NMR.  Pt propelled WC x50' with cues for turning technique and increasing stroke length. Pt with difficulty with motor planning.   WC<>Mat transfers with Min A for steadying, especially with turning due to decreased foot clearance.   Gait traiing 1x80' and 1x180' with RW, R AFO, and Min A for steadying. Cues for heel strike as able and pick up feet during turns especially. Pt has difficulty in busy environment, with increased shuffling.   Dynamic gait turning around and weaving through cones to practice safe stepping with min-guard and cues for completing full R step prior to continuing forward. Pt very fatigued with this task.   Dynamic balance tasks:  - Standing marching without UE assist x20 with manual facilitation for weight shifting, and Min A for steadying overall.  - Sidestepping at handrail R/L x20' each with cues for foot clearance, increased time required. To L more difficult than to R due to decreased R step height.      Therapy Documentation Precautions:  Precautions Precautions: Fall Precaution Comments: pt went home from CIR and does not note falls but did note feeling uneasy at home Required Braces or Orthoses: Other Brace/Splint Other Brace/Splint: right AFO    Pain: None   Mobility:   Locomotion : Ambulation Ambulation/Gait Assistance: 4: Min assist Wheelchair Mobility Distance: 50   See FIM for current functional status  Therapy/Group: Individual Therapy  Clydene Laming, PT, DPT  09/24/2012, 9:59 AM

## 2012-09-24 NOTE — Progress Notes (Signed)
Patient ID: Sophia Rodriguez, female   DOB: May 12, 1950, 62 y.o.   MRN: [id number] Subjective/Complaints: 62 y.o. 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 She was admitted on 08/26/12 with 24 hour history of difficulty walking, decrease in balance as well as increased right sided weakness. MRI of the brain showed acute nonhemorrhagic infarct at the junction of the right thalamus and posterior limb of the right internal capsule as well as remote moderate to large size infarct medial aspect left frontal and parietal lobes as well as left globus pallidus and remote left paracentral small infarct. MRA of the head with atherosclerotic type changes. MRA of the neck without significant stenosis. Echocardiogram with EF 60-65% and source of emboli. Carotid Dopplers with no ICA stenosis. Neurology recommended changing patient to aggrenox for thrombotic stroke due to SVD Extension of R thalamic and PLIC infarct 7/1 Slept ok  No pain  Review of Systems  Neurological: Positive for focal weakness.  All other systems reviewed and are negative.    Objective: Vital Signs: Blood pressure 108/75, pulse 70, temperature 98.1 F (36.7 C), temperature source Oral, resp. rate 18, height 5\' 6"  (1.676 m), weight 73.891 kg (162 lb 14.4 oz), SpO2 100.00%. No results found. Results for orders placed during the hospital encounter of 09/22/12 (from the past 72 hour(s))  CBC     Status: Abnormal   Collection Time    09/22/12  6:28 PM      Result Value Range   WBC 7.8  4.0 - 10.5 K/uL   RBC 4.43  3.87 - 5.11 MIL/uL   Hemoglobin 11.7 (*) 12.0 - 15.0 g/dL   HCT 40.9 (*) 81.1 - 91.4 %   MCV 79.0  78.0 - 100.0 fL   MCH 26.4  26.0 - 34.0 pg   MCHC 33.4  30.0 - 36.0 g/dL   RDW 78.2  95.6 - 21.3 %   Platelets 281  150 - 400 K/uL  CREATININE, SERUM     Status: Abnormal   Collection Time   09/22/12  6:28 PM      Result Value Range   Creatinine, Ser 1.07  0.50 - 1.10 mg/dL   GFR calc non Af Amer 55 (*) >90 mL/min   GFR calc Af Amer 64 (*) >90 mL/min   Comment:            The eGFR has been calculated     using the CKD EPI equation.     This calculation has not been     validated in all clinical     situations.     eGFR's persistently     <90 mL/min signify     possible Chronic Kidney Disease.  GLUCOSE, CAPILLARY     Status: Abnormal   Collection Time    09/22/12  8:45 PM      Result Value Range   Glucose-Capillary 113 (*) 70 - 99 mg/dL  CBC WITH DIFFERENTIAL     Status: Abnormal   Collection Time    09/23/12  6:00 AM      Result Value Range   WBC 6.8  4.0 - 10.5 K/uL   RBC 4.34  3.87 - 5.11 MIL/uL   Hemoglobin 11.3 (*) 12.0 - 15.0 g/dL   HCT 08.6 (*) 57.8 - 46.9 %   MCV 79.3  78.0 - 100.0 fL   MCH 26.0  26.0 - 34.0 pg  MCHC 32.8  30.0 - 36.0 g/dL   RDW 16.1  09.6 - 04.5 %   Platelets 273  150 - 400 K/uL   Neutrophils Relative % 60  43 - 77 %   Neutro Abs 4.1  1.7 - 7.7 K/uL   Lymphocytes Relative 31  12 - 46 %   Lymphs Abs 2.1  0.7 - 4.0 K/uL   Monocytes Relative 8  3 - 12 %   Monocytes Absolute 0.5  0.1 - 1.0 K/uL   Eosinophils Relative 1  0 - 5 %   Eosinophils Absolute 0.1  0.0 - 0.7 K/uL   Basophils Relative 0  0 - 1 %   Basophils Absolute 0.0  0.0 - 0.1 K/uL  COMPREHENSIVE METABOLIC PANEL     Status: Abnormal   Collection Time    09/23/12  6:00 AM      Result Value Range   Sodium 140  135 - 145 mEq/L   Potassium 3.8  3.5 - 5.1 mEq/L   Chloride 107  96 - 112 mEq/L   CO2 23  19 - 32 mEq/L   Glucose, Bld 96  70 - 99 mg/dL   BUN 18  6 - 23 mg/dL   Creatinine, Ser 4.09 (*) 0.50 - 1.10 mg/dL   Calcium 9.4  8.4 - 81.1 mg/dL   Total Protein 6.7  6.0 - 8.3 g/dL   Albumin 3.5  3.5 - 5.2 g/dL   AST 12  0 - 37 U/L   ALT 13  0 - 35 U/L   Alkaline Phosphatase 79  39 - 117 U/L   Total Bilirubin 0.3  0.3 - 1.2 mg/dL   GFR calc non Af Amer 53 (*) >90  mL/min   GFR calc Af Amer 61 (*) >90 mL/min   Comment:            The eGFR has been calculated     using the CKD EPI equation.     This calculation has not been     validated in all clinical     situations.     eGFR's persistently     <90 mL/min signify     possible Chronic Kidney Disease.  GLUCOSE, CAPILLARY     Status: None   Collection Time    09/23/12  7:33 AM      Result Value Range   Glucose-Capillary 94  70 - 99 mg/dL   Comment 1 Notify RN    GLUCOSE, CAPILLARY     Status: None   Collection Time    09/23/12 11:24 AM      Result Value Range   Glucose-Capillary 99  70 - 99 mg/dL   Comment 1 Notify RN    GLUCOSE, CAPILLARY     Status: None   Collection Time    09/23/12  4:38 PM      Result Value Range   Glucose-Capillary 83  70 - 99 mg/dL   Comment 1 Notify RN    GLUCOSE, CAPILLARY     Status: None   Collection Time    09/23/12  9:58 PM      Result Value Range   Glucose-Capillary 99  70 - 99 mg/dL   Comment 1 Documented in Chart     Comment 2 Notify RN    GLUCOSE, CAPILLARY     Status: None   Collection Time    09/24/12  7:28 AM      Result Value Range   Glucose-Capillary 89  70 - 99 mg/dL      Nursing note and vitals reviewed.  Constitutional: She is oriented to person, place, and time. She appears well-developed and well-nourished.  HENT:  Head: Normocephalic and atraumatic.  Eyes: Pupils are equal, round, and reactive to light.  Neck: Normal range of motion. Neck supple.  Cardiovascular: Normal rate and regular rhythm.  Pulmonary/Chest: Effort normal and breath sounds normal. No respiratory distress. She has no wheezes.  Abdominal: Soft. Bowel sounds are normal.  Musculoskeletal: She exhibits no edema and no tenderness. Normal appearing arches, feet--non tender with ROM/palpation Neurological: She is alert and oriented to person, place, and time.  Soft spoken. Speech clear. Some delay in initiation but able to followcommands without difficulty. LUE  weakness with decrease in fine motor control. RLE with minimal weakness.  Skin: Skin is warm and dry.  Psychiatric: Her speech is normal and behavior is normal. Thought content normal. Her mood appears a little flat. Cognition and memory are normal.  Motor strength: 4/5 in the right deltoid, biceps, triceps, grip, hip flexor, knee extensors, 3-/5 ankle dorsiflexor plantar flexor  4 minus/5 in the left deltoid, biceps, triceps, grip, hip flexor, knee extensors, 1/5 ankle dorsiflexor and plantar flexor  Decreased sensory Left great toe  Cerebellar testing no evidence of ataxia on finger nose to finger and heel-to-shin testing are   Assessment/Plan: 1. 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  FIM - Upper Body Dressing/Undressing Upper body dressing/undressing steps patient completed: Thread/unthread right sleeve of pullover shirt/dresss;Thread/unthread left sleeve of pullover shirt/dress;Put head through opening of pull over shirt/dress;Pull shirt over trunk (no bra her on eval) Upper body dressing/undressing: 5: Set-up assist to: Obtain clothing/put away FIM - Lower Body Dressing/Undressing Lower body dressing/undressing steps patient completed: Thread/unthread right underwear leg;Thread/unthread left underwear leg;Pull underwear up/down;Thread/unthread right pants leg;Thread/unthread left pants leg;Pull pants up/down;Don/Doff right sock;Don/Doff left sock;Don/Doff right shoe;Don/Doff left shoe;Fasten/unfasten right shoe;Fasten/unfasten left shoe Lower body dressing/undressing: 4: Steadying Assist  FIM - Toileting Toileting steps completed by patient: Adjust clothing prior to toileting;Performs perineal hygiene Toileting Assistive Devices: Grab bar or rail for support Toileting: 3:  Mod-Patient completed 2 of 3 steps  FIM - Archivist Transfers: 6-To toilet/ BSC;7-From toilet/BSC;5-From toilet/BSC: Supervision (verbal cues/safety issues);5-To toilet/BSC: Supervision (verbal cues/safety issues)  FIM - Press photographer Assistive Devices: Arm rests;Walker Bed/Chair Transfer: 4: Bed > Chair or W/C: Min A (steadying Pt. > 75%);4: Chair or W/C > Bed: Min A (steadying Pt. > 75%)  FIM - Locomotion: Wheelchair Distance: 50 Locomotion: Wheelchair: 2: Travels 50 - 149 ft with minimal assistance (Pt.>75%) FIM - Locomotion: Ambulation Locomotion: Ambulation Assistive Devices: Designer, industrial/product Ambulation/Gait Assistance: 4: Min assist Locomotion: Ambulation: 4: Travels 150 ft or more with minimal assistance (Pt.>75%)  Comprehension Comprehension Mode: Auditory Comprehension: 5-Understands basic 90% of the time/requires cueing < 10% of the time  Expression Expression Mode: Verbal Expression: 5-Expresses complex 90% of the time/cues < 10% of the time  Social Interaction Social Interaction: 5-Interacts appropriately 90% of the time - Needs monitoring or encouragement for participation or interaction.  Problem Solving Problem Solving: 5-Solves basic 90% of the time/requires cueing < 10% of the time  Memory Memory: 6-More than reasonable amt of time  Medical Problem List and Plan:  1. DVT Prophylaxis/Anticoagulation: Pharmaceutical: Lovenox  2.  Pain Management: Will use tylenol prn for pain. Added voltaren gel for left wrist pain. --controlled at present 3. Mood: Flat affect with underlying anxiety noted. Will continue celexa and klonopin. Needs ego support. Will have LCSW follow up for evaluation.  4. Neuropsych: This patient is capable of making decisions on his/her own behalf.  5. DM type 2 with neuropathy: Monitor BS with AC/HS checks. Off metformin. Under control  -follow for increased pain---minor at present. 6. HTN: Will monitor  with bid checks. on low dose ace for better control will close monitoring for hypotension. .  7. Dyslipidemia: LDL--128.   Lipitor  . 8.  Left foot drop new, prob secondary to CVA, also has DM with neuropathy      LOS (Days) 2 A FACE TO FACE EVALUATION WAS PERFORMED  Sophia Rodriguez E 09/24/2012, 11:07 AM

## 2012-09-24 NOTE — Progress Notes (Signed)
Physical Therapy Note  Patient Details  Name: Sophia Rodriguez MRN: [id number] Date of Birth: 08-Jul-1950 Today's Date: 09/24/2012  1500-1525 (25 minutes) individual Pain: no reported pain Focus of treatment: Therapeutic exercise focused on RT LE strengthening/control/ activity tolerance Treatment: transfers stand/turn min assist; Nustep Level 4 X 15 minutes (7.5 minutes using all extremities, 7.5 minutes LEs only). Gait 20 feet HHA on right mod assist (balance) with max vcs to initiate swing on right.    Lailana Shira,JIM 09/24/2012, 3:06 PM

## 2012-09-24 NOTE — Progress Notes (Signed)
Occupational Therapy Session Notes  Patient Details  Name: Sophia Rodriguez MRN: [id number] Date of Birth: September 05, 1950  Today's Date: 09/24/2012 Time: 0800-0900 and 205-250 Time Calculation (min): 60 min and 45 min  Short Term Goals: Week 1:  OT Short Term Goal 1 (Week 1): Bath:  Supervision to include sit & stand in shower as well as set up using RW. OT Short Term Goal 2 (Week 1): UB & LB Dressing:  Supervision to include sit & stand as well as set up using RW. OT Short Term Goal 3 (Week 1): Groom:  Stand at sink for at least 3 tasks with supervision. OT Short Term Goal 4 (Week 1): Toilet transfers:  Supervision with RW OT Short Term Goal 5 (Week 1): IADL:  Supervision with simple HM and kitchen tasks with RW.  Skilled Therapeutic Interventions/Progress Updates:  1)  Self care retraining to include shower, dress and groom.  Focused session on activity tolerance, bed mobility, sit><stands, standing balance and tolerance.  Patient in w/c with all items within reach.  2) Patient resting in w/c upon arrival.  Engaged in IADL activities at a walker level in therapy apartment/kitchen and tabletop coordination exercises in standing. Focused session on exploring kitchen cabinets and refrigerator contents, started grocery list, walker safety and counter walking secondary patient reports that her kitchen is small so will park walker just outside kitchen and use kitchen countertops for support.  Patient also practiced sit><stand from kitchen chair that is 17 1/2" tall with just supervision.  When vcs provided to lift right foot to clear the surface, patient able to take a nice step.  When focusing on a task or during turning/backing up patient drags right foot.    Therapy Documentation Precautions:  Precautions Precautions: Fall Precaution Comments: pt went home from CIR and does not note falls but did note feeling uneasy at home Required Braces or Orthoses: Other Brace/Splint Other Brace/Splint: right  AFO Pain: 1)  Denies pain, 2)  Denies pain ADL: See FIM for current functional status  Therapy/Group: Individual Therapy both sessions  Farha Dano 09/24/2012, 11:44 AM

## 2012-09-24 NOTE — Progress Notes (Signed)
Physical Therapy Note  Patient Details  Name: Sophia Rodriguez MRN: [id number] Date of Birth: 1950-12-15 Today's Date: 09/24/2012  1500-1525 (25 minutes) individual Pain: no reported pain Focus of treatment: Therapeutic exercise focused on RT LE strengthening/control/ activity tolerance Treatment: transfers stand/turn min assist; Nustep Level 4 X 15 minutes (7.5 minutes using all extremities, 7.5 minutes LEs only). Gait - 20 feet HHA on right mod assist for balance with minimal step length on right/ max vcs.    Norbert Malkin,JIM 09/24/2012, 3:59 PM

## 2012-09-25 ENCOUNTER — Inpatient Hospital Stay (HOSPITAL_COMMUNITY): Payer: Medicare Other | Admitting: *Deleted

## 2012-09-25 ENCOUNTER — Inpatient Hospital Stay (HOSPITAL_COMMUNITY): Payer: Medicare Other | Admitting: Physical Therapy

## 2012-09-25 ENCOUNTER — Encounter (HOSPITAL_COMMUNITY): Payer: Medicare Other | Admitting: Occupational Therapy

## 2012-09-25 DIAGNOSIS — I6330 Cerebral infarction due to thrombosis of unspecified cerebral artery: Secondary | ICD-10-CM

## 2012-09-25 DIAGNOSIS — E1142 Type 2 diabetes mellitus with diabetic polyneuropathy: Secondary | ICD-10-CM

## 2012-09-25 DIAGNOSIS — I10 Essential (primary) hypertension: Secondary | ICD-10-CM

## 2012-09-25 DIAGNOSIS — E1149 Type 2 diabetes mellitus with other diabetic neurological complication: Secondary | ICD-10-CM

## 2012-09-25 LAB — GLUCOSE, CAPILLARY
Glucose-Capillary: 102 mg/dL — ABNORMAL HIGH (ref 70–99)
Glucose-Capillary: 118 mg/dL — ABNORMAL HIGH (ref 70–99)
Glucose-Capillary: 79 mg/dL (ref 70–99)

## 2012-09-25 NOTE — Progress Notes (Signed)
Sophia Rodriguez is a 62 y.o. female [birth year] [id number]  Subjective: No new complaints. No new problems. Slept well. Feeling OK.  Objective: Vital signs in last 24 hours: Temp:  [98.4 F (36.9 C)] 98.4 F (36.9 C) (07/05 0544) Pulse Rate:  [59] 59 (07/05 0544) Resp:  [20] 20 (07/05 0544) BP: (118)/(78) 118/78 mmHg (07/05 0544) SpO2:  [98 %] 98 % (07/05 0544) Weight change:  Last BM Date: 09/22/12  Intake/Output from previous day: 07/04 0701 - 07/05 0700 In: 480 [P.O.:480] Out: -  Last cbgs: CBG (last 3)   Recent Labs  09/24/12 1650 09/24/12 2105 09/25/12 0735  GLUCAP 90 107* 102*     Physical Exam General: No apparent distress    HEENT: moist mucosa Lungs: Normal effort. Lungs clear to auscultation, no crackles or wheezes. Cardiovascular: Regular rate and rhythm, no edema Musculoskeletal:  No change from before Neurological: No new neurological deficits Wounds: N/A    Skin: clear Alert, cooperative   Lab Results: BMET    Component Value Date/Time   NA 140 09/23/2012 0600   K 3.8 09/23/2012 0600   CL 107 09/23/2012 0600   CO2 23 09/23/2012 0600   GLUCOSE 96 09/23/2012 0600   BUN 18 09/23/2012 0600   CREATININE 1.11* 09/23/2012 0600   CALCIUM 9.4 09/23/2012 0600   GFRNONAA 53* 09/23/2012 0600   GFRAA 61* 09/23/2012 0600   CBC    Component Value Date/Time   WBC 6.8 09/23/2012 0600   RBC 4.34 09/23/2012 0600   HGB 11.3* 09/23/2012 0600   HCT 34.4* 09/23/2012 0600   PLT 273 09/23/2012 0600   MCV 79.3 09/23/2012 0600   MCH 26.0 09/23/2012 0600   MCHC 32.8 09/23/2012 0600   RDW 14.5 09/23/2012 0600   LYMPHSABS 2.1 09/23/2012 0600   MONOABS 0.5 09/23/2012 0600   EOSABS 0.1 09/23/2012 0600   BASOSABS 0.0 09/23/2012 0600    Studies/Results: No results found.  Medications: I have reviewed the patient's current medications.  Assessment/Plan:  1. DVT Prophylaxis/Anticoagulation: Pharmaceutical: Lovenox  2. Pain Management: Will use tylenol prn for pain. Added voltaren gel for left wrist  pain. --controlled at present  3. Mood: Flat affect with underlying anxiety noted. Will continue celexa and klonopin. Needs ego support. Will have LCSW follow up for evaluation.  4. Neuropsych: This patient is capable of making decisions on his/her own behalf.  5. DM type 2 with neuropathy: Monitor BS with AC/HS checks. Off metformin. Under control  -follow for increased pain---minor at present.  6. HTN: Will monitor with bid checks. on low dose ace for better control will close monitoring for hypotension. .  7. Dyslipidemia: LDL--128. Lipitor .  8. Left foot drop new, prob secondary to CVA, also has DM with neuropathy      Length of stay, days: 3  Sonda Primes , MD 09/25/2012, 8:48 AM

## 2012-09-25 NOTE — Progress Notes (Signed)
Occupational Therapy Session Note  Patient Details  Name: Sophia Rodriguez MRN: [id number] Date of Birth: 1951-02-13  Today's Date: 09/25/2012 Time: 1107-1200 Time Calculation (min): 53 min  Short Term Goals: Week 1:  OT Short Term Goal 1 (Week 1): Bath:  Supervision to include sit & stand in shower as well as set up using RW. OT Short Term Goal 2 (Week 1): UB & LB Dressing:  Supervision to include sit & stand as well as set up using RW. OT Short Term Goal 3 (Week 1): Groom:  Stand at sink for at least 3 tasks with supervision. OT Short Term Goal 4 (Week 1): Toilet transfers:  Supervision with RW OT Short Term Goal 5 (Week 1): IADL:  Supervision with simple HM and kitchen tasks with RW.  Skilled Therapeutic Interventions/Progress Updates:   Patient seen this am for 1:1 OT session to address neuromuscular re-education to right greater than left side during daily self care skills.  Patient able to ambulate to shower with right AFO, and rolling walker and min assist to supervision.  Patient with tendency to limit weight on right leg during transitional movements, and even during walking.  Addressed balance and coordination of all extremities during self care training.   Therapy Documentation Precautions:  Precautions Precautions: Fall Precaution Comments: pt went home from CIR and does not note falls but did note feeling uneasy at home Required Braces or Orthoses: Other Brace/Splint Other Brace/Splint: right AFO Restrictions Weight Bearing Restrictions: No Pain: Pain Assessment Pain Score: 0-No pain  See FIM for current functional status  Therapy/Group: Individual Therapy  Collier Salina 09/25/2012, 12:03 PM

## 2012-09-25 NOTE — Progress Notes (Signed)
Physical Therapy Session Note  Patient Details  Name: Sophia Rodriguez MRN: [id number] Date of Birth: 1950-09-10  Today's Date: 09/25/2012 Time: 1400-1445 Time Calculation (min): 45 min  Short Term Goals: Week 1:  PT Short Term Goal 1 (Week 1): = LTGs   Therapy Documentation Precautions:  Precautions Precautions: Fall Precaution Comments: pt went home from CIR and does not note falls but did note feeling uneasy at home Required Braces or Orthoses: Other Brace/Splint Other Brace/Splint: right AFO Restrictions Weight Bearing Restrictions: No  Pain: denies pain  Therapeutic Exercise:(15') Nu-Step level 4 x 10' Gait Training:(30') using RW 5 x 120' with emphasis on R LE longer stride. Most effective when having patient step on every-other tile with L and with R foot. Min-Assist during gait training for occasional LOB but later in tx session was S/Mod-I. Rest breaks in between bouts of gait training.    Therapy/Group: Individual Therapy  Ifeanyi Mickelson J 09/25/2012, 2:02 PM

## 2012-09-25 NOTE — Progress Notes (Signed)
Physical Therapy Session Note  Patient Details  Name: Sophia Rodriguez MRN: [id number] Date of Birth: [birth year]  Today's Date: 09/25/2012 Time: 0830-0930 Time Calculation (min): 60 min  Short Term Goals: Week 1:  PT Short Term Goal 1 (Week 1): = LTGs   Therapy Documentation Precautions:  Precautions Precautions: Fall Precaution Comments: pt went home from CIR and does not note falls but did note feeling uneasy at home Required Braces or Orthoses: Other Brace/Splint Other Brace/Splint: right AFO Pain: denies pain  Therapeutic Activity:(15') bed mobility with Mod-I assist, transfer sit<->stand S/Mod-I  Therapeutic exercise:(30') Supine and sitting LE There Ex B LE's Gait Training:(15') using RW 3 x 80' with S/Mod-I, no LOB noted throughout although R foot clearance and swing phase is fair -   Therapy/Group: Individual Therapy  Rex Kras 09/25/2012, 8:37 AM

## 2012-09-25 NOTE — Progress Notes (Signed)
Sophia Rodriguez is a 62 y.o. female 1951-02-20 [id number]  Subjective: C/o insomnia. Feeling OK.  Objective: Vital signs in last 24 hours: Temp:  [98.4 F (36.9 C)] 98.4 F (36.9 C) (07/05 0544) Pulse Rate:  [59] 59 (07/05 0544) Resp:  [20] 20 (07/05 0544) BP: (118)/(78) 118/78 mmHg (07/05 0544) SpO2:  [98 %] 98 % (07/05 0544) Weight change:  Last BM Date: 09/22/12  Intake/Output from previous day: 07/04 0701 - 07/05 0700 In: 480 [P.O.:480] Out: -  Last cbgs: CBG (last 3)   Recent Labs  09/24/12 1650 09/24/12 2105 09/25/12 0735  GLUCAP 90 107* 102*     Physical Exam General: No apparent distress    HEENT: moist mucosa Lungs: Normal effort. Lungs clear to auscultation, no crackles or wheezes. Cardiovascular: Regular rate and rhythm, no edema Musculoskeletal:  No change from before Neurological: No new neurological deficits Wounds: N/A    Skin: clear Alert, cooperative   Lab Results: BMET    Component Value Date/Time   NA 140 09/23/2012 0600   K 3.8 09/23/2012 0600   CL 107 09/23/2012 0600   CO2 23 09/23/2012 0600   GLUCOSE 96 09/23/2012 0600   BUN 18 09/23/2012 0600   CREATININE 1.11* 09/23/2012 0600   CALCIUM 9.4 09/23/2012 0600   GFRNONAA 53* 09/23/2012 0600   GFRAA 61* 09/23/2012 0600   CBC    Component Value Date/Time   WBC 6.8 09/23/2012 0600   RBC 4.34 09/23/2012 0600   HGB 11.3* 09/23/2012 0600   HCT 34.4* 09/23/2012 0600   PLT 273 09/23/2012 0600   MCV 79.3 09/23/2012 0600   MCH 26.0 09/23/2012 0600   MCHC 32.8 09/23/2012 0600   RDW 14.5 09/23/2012 0600   LYMPHSABS 2.1 09/23/2012 0600   MONOABS 0.5 09/23/2012 0600   EOSABS 0.1 09/23/2012 0600   BASOSABS 0.0 09/23/2012 0600    Studies/Results: No results found.  Medications: I have reviewed the patient's current medications.  Assessment/Plan:  1. DVT Prophylaxis/Anticoagulation: Pharmaceutical: Lovenox  2. Pain Management: Will use tylenol prn for pain. Added voltaren gel for left wrist pain. --controlled at present  3.  Mood: Flat affect with underlying anxiety noted. Will continue celexa and klonopin. Needs ego support. Will have LCSW follow up for evaluation.  4. Neuropsych: This patient is capable of making decisions on his/her own behalf.  5. DM type 2 with neuropathy: Monitor BS with AC/HS checks. Off metformin. Under control  -follow for increased pain---minor at present.  6. HTN: Will monitor with bid checks. on low dose ace for better control will close monitoring for hypotension. .  7. Dyslipidemia: LDL--128. Lipitor .  8. Left foot drop new, prob secondary to CVA, also has DM with neuropathy  9. Insomnia - see meds       Length of stay, days: 3  Sonda Primes , MD 09/25/2012, 9:17 AM

## 2012-09-25 NOTE — Progress Notes (Signed)
Ocupational Therapy Note  Patient Details  Name: Sophia Rodriguez MRN: [id number] Date of Birth: May 27, 1950 Today's Date: 09/25/2012  Time:  1500-1530  (30 min) Pain:  None Individual session  Addressed NMRE to BUE.  Pt has 4/5 strength in RUE and 3/5 in left. Shoulder flexion.  Performed AROM in all planes a shoulder, elbow, wrist and hand.  Left hand had decreased strength and coordination compared to right.  Practiced handwriting with R and left.  Provided instructions for handwriting exercise on left.  Pt reported she wrote  with both hands before the stroke.     Humberto Seals 09/25/2012, 3:08 PM

## 2012-09-26 ENCOUNTER — Inpatient Hospital Stay (HOSPITAL_COMMUNITY): Payer: Medicare Other | Admitting: *Deleted

## 2012-09-26 DIAGNOSIS — M79609 Pain in unspecified limb: Secondary | ICD-10-CM

## 2012-09-26 LAB — GLUCOSE, CAPILLARY
Glucose-Capillary: 93 mg/dL (ref 70–99)
Glucose-Capillary: 90 mg/dL (ref 70–99)

## 2012-09-26 NOTE — Progress Notes (Signed)
Physical Therapy Session Note  Patient Details  Name: Shanese Riemenschneider MRN: [id number] Date of Birth: 06/19/1950  Today's Date: 09/26/2012 Time: 0800-0900   Skilled Therapeutic Interventions/Progress Updates:  Patient resting in bed at the beginning of the session, training in rolling R  And coming to sit EOB, with Supervision , sit to stand and transfer to w/c with min A. Transfer in/out of the bathroom with the use of grab bars, min A. W/c propulsion on a distance of 60 feet with verbal cues for use of B LE. Kinetron  In sitting and standing to increase strength and facilitate reciprocal movements as well as to assist in proper weight shifting. NuStep 1x5 min just LE level 4 1x 5 min UE and LE. Gait Training with RW 1x 30 feet , 2x 75 feet with verbal cues for step length and weight shift.  Therapy Documentation Precautions:  Precautions Precautions: Fall Precaution Comments: pt went home from CIR and does not note falls but did note feeling uneasy at home Required Braces or Orthoses: Other Brace/Splint Other Brace/Splint: right AFO Restrictions Weight Bearing Restrictions: No   See FIM for current functional status  Therapy/Group: Individual Therapy  Dorna Mai 09/26/2012, 9:42 AM

## 2012-09-26 NOTE — Progress Notes (Signed)
Patient complaining of tingling and numbness to right upper extremity which she states is a new sensation. BP 110/70, heart rate 80. MD notified with no new orders but to continue to monitor and observe for any new changes.

## 2012-09-26 NOTE — Progress Notes (Signed)
Patient ID: Sophia Rodriguez, female   DOB: [birth year], 62 y.o.   MRN: [id number] Sophia Rodriguez is a 62 y.o. female [birth year] [id number]  Subjective: C/o insomnia. Feeling OK overall. C/o R arm was hurting some last night  Objective: Vital signs in last 24 hours: Temp:  [97.9 F (36.6 C)-98.2 F (36.8 C)] 97.9 F (36.6 C) (07/06 0430) Pulse Rate:  [58-77] 77 (07/06 0430) Resp:  [18-20] 18 (07/06 0430) BP: (120-136)/(83-84) 136/84 mmHg (07/06 0430) SpO2:  [98 %-100 %] 100 % (07/06 0430) Weight change:  Last BM Date: 09/22/12 (refuses laxative)  Intake/Output from previous day: 07/05 0701 - 07/06 0700 In: 750 [P.O.:750] Out: -  Last cbgs: CBG (last 3)   Recent Labs  09/25/12 1648 09/25/12 2029 09/26/12 0727  GLUCAP 79 116* 93     Physical Exam General: No apparent distress    HEENT: moist mucosa Lungs: Normal effort. Lungs clear to auscultation, no crackles or wheezes. Cardiovascular: Regular rate and rhythm, no edema Musculoskeletal:  No change from before. R arm is NT, no joint swelling Neurological: No new neurological deficits Wounds: N/A    Skin: clear Alert, cooperative   Lab Results: BMET    Component Value Date/Time   NA 140 09/23/2012 0600   K 3.8 09/23/2012 0600   CL 107 09/23/2012 0600   CO2 23 09/23/2012 0600   GLUCOSE 96 09/23/2012 0600   BUN 18 09/23/2012 0600   CREATININE 1.11* 09/23/2012 0600   CALCIUM 9.4 09/23/2012 0600   GFRNONAA 53* 09/23/2012 0600   GFRAA 61* 09/23/2012 0600   CBC    Component Value Date/Time   WBC 6.8 09/23/2012 0600   RBC 4.34 09/23/2012 0600   HGB 11.3* 09/23/2012 0600   HCT 34.4* 09/23/2012 0600   PLT 273 09/23/2012 0600   MCV 79.3 09/23/2012 0600   MCH 26.0 09/23/2012 0600   MCHC 32.8 09/23/2012 0600   RDW 14.5 09/23/2012 0600   LYMPHSABS 2.1 09/23/2012 0600   MONOABS 0.5 09/23/2012 0600   EOSABS 0.1 09/23/2012 0600   BASOSABS 0.0 09/23/2012 0600    Studies/Results: No results found.  Medications: I have reviewed the patient's current  medications.  Assessment/Plan:  1. DVT Prophylaxis/Anticoagulation: Pharmaceutical: Lovenox  2. Pain Management: Will use tylenol prn for pain. Added voltaren gel for left wrist pain. --controlled at present  3. Mood: Flat affect with underlying anxiety noted. Will continue celexa and klonopin. Needs ego support. Will have LCSW follow up for evaluation.  4. Neuropsych: This patient is capable of making decisions on his/her own behalf.  5. DM type 2 with neuropathy: Monitor BS with AC/HS checks. Off metformin. Under control  -follow for increased pain---minor at present.  6. HTN: Will monitor with bid checks. on low dose ace for better control will close monitoring for hypotension. .  7. Dyslipidemia: LDL--128. Lipitor .  8. Left foot drop new, prob secondary to CVA, also has DM with neuropathy  9. Insomnia - see meds 10. Episode of R arm pain of ?etiology resolved. Will watch       Length of stay, days: 4  Sonda Primes , MD 09/26/2012, 8:13 AM

## 2012-09-27 ENCOUNTER — Inpatient Hospital Stay (HOSPITAL_COMMUNITY): Payer: Medicare Other | Admitting: Occupational Therapy

## 2012-09-27 ENCOUNTER — Inpatient Hospital Stay (HOSPITAL_COMMUNITY): Payer: Medicare Other | Admitting: Rehabilitation

## 2012-09-27 ENCOUNTER — Encounter: Payer: Medicare Other | Attending: Physical Medicine & Rehabilitation

## 2012-09-27 ENCOUNTER — Inpatient Hospital Stay: Payer: Medicare Other | Admitting: Physical Medicine & Rehabilitation

## 2012-09-27 ENCOUNTER — Inpatient Hospital Stay (HOSPITAL_COMMUNITY): Payer: Medicare Other

## 2012-09-27 DIAGNOSIS — G8110 Spastic hemiplegia affecting unspecified side: Secondary | ICD-10-CM

## 2012-09-27 DIAGNOSIS — I6330 Cerebral infarction due to thrombosis of unspecified cerebral artery: Secondary | ICD-10-CM

## 2012-09-27 LAB — GLUCOSE, CAPILLARY
Glucose-Capillary: 100 mg/dL — ABNORMAL HIGH (ref 70–99)
Glucose-Capillary: 94 mg/dL (ref 70–99)
Glucose-Capillary: 89 mg/dL (ref 70–99)
Glucose-Capillary: 123 mg/dL — ABNORMAL HIGH (ref 70–99)

## 2012-09-27 NOTE — Progress Notes (Signed)
Physical Therapy Session Note  Patient Details  Name: Sophia Rodriguez MRN: [id number] Date of Birth: 08-Feb-1951  Today's Date: 09/27/2012 Time: 1107-1207 and 4098-1191 Time Calculation (min): 60 min and 45 min  Short Term Goals: Week 1:  PT Short Term Goal 1 (Week 1): = LTGs  Skilled Therapeutic Interventions/Progress Updates:  Treatment 1:  focused on neuromuscular re-education via demo, manual and VCs for R and LLE motor control during gait, transfers, and on Kinetron.  Gait on carpet x 50' with RAFO and RW, with supervision; short step lengths, lack of trunk rotation noted bil.  Backwards ambulation with RW, on carpet, focusing on bil knee flexion and hip extension, trunk rotation to progress feet. Attempted R sidestepping; pt requires max assist to progress RLE due to lack of hip abductor activation  Kinetron at level 6>7 for short bursts (1 min> 2 min)focusing on trunk and LE muscle strengthening/flexiblity; O2 sats 99%, HR 74>80 with ex.  Treatment 2:  Focused on neuromuscular re-education as above, plus Biodex for wt shifting, and RLe wt bearing retraining, using Wt Shifting and Limits of Stability programs; sit>< stand with increased wt bearing RLE.  Pt's ability to wt shift to RLE improved during time on Biodex (velocity and accuracy).  Pt's R gluteal weakness limits her confidence and comfort shifting to her RLE.     Therapy Documentation Precautions:  Precautions Precautions: Fall Precaution Comments: pt went home from CIR and does not note falls but did note feeling uneasy at home Required Braces or Orthoses: Other Brace/Splint Other Brace/Splint: right AFO Restrictions Weight Bearing Restrictions: No   Pain: Pain Assessment Pain Assessment: No/denies pain   Locomotion : Ambulation Ambulation/Gait Assistance: 5: Supervision    Other Treatments: Treatments Neuromuscular Facilitation: Right;Left;Lower Extremity;Activity to increase motor control;Activity to  increase timing and sequencing;Activity to increase grading;Activity to increase lateral weight shifting  See FIM for current functional status  Therapy/Group: Individual Therapy  Garielle Mroz 09/27/2012, 12:13 PM

## 2012-09-27 NOTE — Progress Notes (Signed)
Patient ID: Sophia Rodriguez, female   DOB: June 19, 1950, 62 y.o.   MRN: [id number] Subjective/Complaints: 62 y.o. 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 She was admitted on 08/26/12 with 24 hour history of difficulty walking, decrease in balance as well as increased right sided weakness. MRI of the brain showed acute nonhemorrhagic infarct at the junction of the right thalamus and posterior limb of the right internal capsule as well as remote moderate to large size infarct medial aspect left frontal and parietal lobes as well as left globus pallidus and remote left paracentral small infarct. MRA of the head with atherosclerotic type changes. MRA of the neck without significant stenosis. Echocardiogram with EF 60-65% and source of emboli. Carotid Dopplers with no ICA stenosis. Neurology recommended changing patient to aggrenox for thrombotic stroke due to SVD Extension of R thalamic and PLIC infarct 7/1  No C/O s  Review of Systems  Neurological: Positive for focal weakness.  All other systems reviewed and are negative.    Objective: Vital Signs: Blood pressure 131/89, pulse 97, temperature 98 F (36.7 C), temperature source Oral, resp. rate 18, height 5\' 6"  (1.676 m), weight 73.891 kg (162 lb 14.4 oz), SpO2 97.00%. No results found. Results for orders placed during the hospital encounter of 09/22/12 (from the past 72 hour(s))  GLUCOSE, CAPILLARY     Status: None   Collection Time    09/24/12 11:15 AM      Result Value Range   Glucose-Capillary 88  70 - 99 mg/dL  GLUCOSE, CAPILLARY     Status: None   Collection Time    09/24/12  4:50 PM      Result Value Range   Glucose-Capillary 90  70 - 99 mg/dL  GLUCOSE, CAPILLARY     Status: Abnormal   Collection Time    09/24/12  9:05 PM      Result Value Range   Glucose-Capillary 107 (*) 70 - 99 mg/dL   Comment  1 Notify RN    GLUCOSE, CAPILLARY     Status: Abnormal   Collection Time    09/25/12  7:35 AM      Result Value Range   Glucose-Capillary 102 (*) 70 - 99 mg/dL   Comment 1 Notify RN    GLUCOSE, CAPILLARY     Status: Abnormal   Collection Time    09/25/12 11:56 AM      Result Value Range   Glucose-Capillary 118 (*) 70 - 99 mg/dL  GLUCOSE, CAPILLARY     Status: None   Collection Time    09/25/12  4:48 PM      Result Value Range   Glucose-Capillary 79  70 - 99 mg/dL  GLUCOSE, CAPILLARY     Status: Abnormal   Collection Time    09/25/12  8:29 PM      Result Value Range   Glucose-Capillary 116 (*) 70 - 99 mg/dL   Comment 1 Notify RN    GLUCOSE, CAPILLARY     Status: None   Collection Time    09/26/12  7:27 AM      Result Value Range   Glucose-Capillary 93  70 - 99 mg/dL   Comment 1 Notify RN    GLUCOSE, CAPILLARY     Status: Abnormal   Collection Time    09/26/12 11:19 AM      Result Value Range   Glucose-Capillary 140 (*) 70 -  99 mg/dL   Comment 1 Notify RN    GLUCOSE, CAPILLARY     Status: None   Collection Time    09/26/12  4:42 PM      Result Value Range   Glucose-Capillary 90  70 - 99 mg/dL  GLUCOSE, CAPILLARY     Status: Abnormal   Collection Time    09/26/12  8:24 PM      Result Value Range   Glucose-Capillary 201 (*) 70 - 99 mg/dL   Comment 1 Notify RN    GLUCOSE, CAPILLARY     Status: Abnormal   Collection Time    09/27/12  7:18 AM      Result Value Range   Glucose-Capillary 100 (*) 70 - 99 mg/dL   Comment 1 Notify RN        Nursing note and vitals reviewed.  Constitutional: She is oriented to person, place, and time. She appears well-developed and well-nourished.  HENT:  Head: Normocephalic and atraumatic.  Eyes: Pupils are equal, round, and reactive to light.  Neck: Normal range of motion. Neck supple.  Cardiovascular: Normal rate and regular rhythm.  Pulmonary/Chest: Effort normal and breath sounds normal. No respiratory distress. She has no  wheezes.  Abdominal: Soft. Bowel sounds are normal.  Musculoskeletal: She exhibits no edema and no tenderness. Normal appearing arches, feet--non tender with ROM/palpation Neurological: She is alert and oriented to person, place, and time.  Soft spoken. Speech clear. Some delay in initiation but able to followcommands without difficulty. LUE weakness with decrease in fine motor control. RLE with minimal weakness.  Skin: Skin is warm and dry.  Psychiatric: Her speech is normal and behavior is normal. Thought content normal. Her mood appears a little flat. Cognition and memory are normal.  Motor strength: 4/5 in the right deltoid, biceps, triceps, grip, hip flexor, knee extensors, 3-/5 ankle dorsiflexor plantar flexor  4 minus/5 in the left deltoid, biceps, triceps, grip, hip flexor, knee extensors, 1/5 ankle dorsiflexor and plantar flexor  Decreased sensory Left great toe  Cerebellar testing no evidence of ataxia on finger nose to finger and heel-to-shin testing are   Assessment/Plan: 1. 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  FIM - Upper Body Dressing/Undressing Upper body dressing/undressing steps patient completed: Thread/unthread right bra strap;Thread/unthread left bra strap;Hook/unhook bra;Thread/unthread right sleeve of pullover shirt/dresss;Thread/unthread left sleeve of pullover shirt/dress;Put head through opening of pull over shirt/dress;Pull shirt over trunk Upper body dressing/undressing: 5: Set-up assist to: Obtain clothing/put away FIM - Lower Body Dressing/Undressing Lower body dressing/undressing steps patient completed: Thread/unthread right underwear leg;Thread/unthread left underwear leg;Pull underwear up/down;Thread/unthread right pants leg;Thread/unthread left  pants leg;Pull pants up/down;Don/Doff right sock;Don/Doff left sock;Don/Doff right shoe;Don/Doff left shoe;Fasten/unfasten right shoe;Fasten/unfasten left shoe Lower body dressing/undressing: 5: Set-up assist to: Obtain clothing  FIM - Toileting Toileting steps completed by patient: Adjust clothing prior to toileting;Performs perineal hygiene;Adjust clothing after toileting Toileting Assistive Devices: Grab bar or rail for support Toileting: 5: Supervision: Safety issues/verbal cues  FIM - Diplomatic Services operational officer Devices: Art gallery manager Transfers: 4-To toilet/BSC: Min A (steadying Pt. > 75%);4-From toilet/BSC: Min A (steadying Pt. > 75%)  FIM - Bed/Chair Transfer Bed/Chair Transfer Assistive Devices: Therapist, occupational: 5: Chair or W/C > Bed: Supervision (verbal cues/safety issues)  FIM - Locomotion: Wheelchair Distance: 50 Locomotion: Wheelchair: 4: [employer]  150 ft or more: maneuvers on rugs and over door sillls with minimal assistance (Pt.>75%) FIM - Locomotion: Ambulation Locomotion: Ambulation Assistive Devices: Walker - Rolling Ambulation/Gait Assistance: 4: Min assist Locomotion: Ambulation: 2: Travels 50 - 149 ft with minimal assistance (Pt.>75%)  Comprehension Comprehension Mode: Auditory Comprehension: 5-Follows basic conversation/direction: With extra time/assistive device  Expression Expression Mode: Verbal Expression: 5-Expresses basic needs/ideas: With extra time/assistive device  Social Interaction Social Interaction: 5-Interacts appropriately 90% of the time - Needs monitoring or encouragement for participation or interaction.  Problem Solving Problem Solving: 5-Solves basic 90% of the time/requires cueing < 10% of the time  Memory Memory: 6-More than reasonable amt of time  Medical Problem List and Plan:  1. DVT Prophylaxis/Anticoagulation: Pharmaceutical: Lovenox  2. Pain Management: Will use tylenol prn for pain. Added voltaren gel  for left wrist pain. --controlled at present 3. Mood: Flat affect with underlying anxiety noted. Will continue celexa and klonopin. Needs ego support. Will have LCSW follow up for evaluation.  4. Neuropsych: This patient is capable of making decisions on his/her own behalf.  5. DM type 2 with neuropathy: Monitor BS with AC/HS checks. Off metformin. Under control  -follow for increased pain---minor at present. 6. HTN: Will monitor with bid checks. on low dose ace for better control will close monitoring for hypotension. .  7. Dyslipidemia: LDL--128.   Lipitor  . 8.  Left foot drop new, prob secondary to CVA, also has DM with neuropathy, will discuss need for AFO with PT      LOS (Days) 5 A FACE TO FACE EVALUATION WAS PERFORMED  Hence Derrick E 09/27/2012, 9:42 AM

## 2012-09-27 NOTE — Progress Notes (Signed)
Occupational Therapy Session Notes  Patient Details  Name: Sophia Rodriguez MRN: [id number] Date of Birth: May 12, 1950  Today's Date: 09/27/2012 Time: 0800-0900 and 1030-1105 Time Calculation (min): 60 min and 35 min  Short Term Goals: Week 1:  OT Short Term Goal 1 (Week 1): Bath:  Supervision to include sit & stand in shower as well as set up using RW. OT Short Term Goal 2 (Week 1): UB & LB Dressing:  Supervision to include sit & stand as well as set up using RW. OT Short Term Goal 3 (Week 1): Groom:  Stand at sink for at least 3 tasks with supervision. OT Short Term Goal 4 (Week 1): Toilet transfers:  Supervision with RW OT Short Term Goal 5 (Week 1): IADL:  Supervision with simple HM and kitchen tasks with RW.  Skilled Therapeutic Interventions/Progress Updates:  1)  Patient resting in bed upon arrival.  Engaged in self care retraining to include toilet transfer, toileting, shower, dress and groom tasks.  Patient declined to don AFO to ambulate to bathroom and demonstrates increased right foot drag and shuffling gait while ambulating with RW yet with no LOB.  Patient reports RLE often slow in the mornings.  Focused session on setting up the BADL session similar to the routine she has at home.  Ambulated back to hospital room with RW and barefoot to gather clothes and dress EOB.  Patient fincshed session by cleaning up after shower and straighten up her room using her RW.  When patient is completing BADL/IADL tasks patient continue to require vcs to weight shift left enough to advance RLE with and without AFO.  RLE only advances ~1/3 the distance to complete a normal swing through.  When patient is just focusing on weight shifts and swing through without thinking/completing a functional task, she can more often complete a full or almost full swing through.  2)  NMR to include facilitation of midline orientation, forced use, coordination, and heavy weight bearing. Ambulated with HHA and facilitation  of increased speed, patient able to accomplish near normal weight shifts needed to clear her RLE with occasional stops to regroup if she heard her right shoe make a squeak noise as it drug on the floor.  Patient rested in regular chair and had increased difficulty with sit>stand when challenged not to use her hands.  Patient unable to perform reciprocal scooting or to perform forward weight shifts without assistance or use of at least one hand.  Patient climbed on the therapy mat on her knees to practice tall kneeling activities to include increased weight shifts and weight bearing.  Propelled patient back to her room via w/c to await her next therapy.  Therapy Documentation Precautions:  Precautions Precautions: Fall Precaution Comments: pt went home from CIR and does not note falls but did note feeling uneasy at home Required Braces or Orthoses: Other Brace/Splint Other Brace/Splint: right AFO Restrictions Weight Bearing Restrictions: No Pain: 1)  "A little headache", declines medication, rest PRN, 2)  Headache not any better however, patient declines medication ADL: See FIM for current functional status  Therapy/Group: Individual Therapy for both sessions  Nissi Doffing 09/27/2012, 8:12 AM

## 2012-09-28 ENCOUNTER — Ambulatory Visit: Payer: Medicare Other | Admitting: Physical Therapy

## 2012-09-28 ENCOUNTER — Ambulatory Visit: Payer: Medicare Other | Admitting: Occupational Therapy

## 2012-09-28 ENCOUNTER — Inpatient Hospital Stay (HOSPITAL_COMMUNITY): Payer: Medicare Other | Admitting: Physical Therapy

## 2012-09-28 ENCOUNTER — Inpatient Hospital Stay (HOSPITAL_COMMUNITY): Payer: Medicare Other | Admitting: *Deleted

## 2012-09-28 ENCOUNTER — Inpatient Hospital Stay (HOSPITAL_COMMUNITY): Payer: Medicare Other | Admitting: Occupational Therapy

## 2012-09-28 DIAGNOSIS — I6330 Cerebral infarction due to thrombosis of unspecified cerebral artery: Secondary | ICD-10-CM

## 2012-09-28 DIAGNOSIS — G8110 Spastic hemiplegia affecting unspecified side: Secondary | ICD-10-CM

## 2012-09-28 LAB — GLUCOSE, CAPILLARY
Glucose-Capillary: 103 mg/dL — ABNORMAL HIGH (ref 70–99)
Glucose-Capillary: 104 mg/dL — ABNORMAL HIGH (ref 70–99)

## 2012-09-28 NOTE — Evaluation (Signed)
Recreational Therapy Assessment and Plan  Patient Details  Name: Sophia Rodriguez MRN: [id number] Date of Birth: 27-Aug-1950 Today's Date: 09/28/2012  Rehab Potential: Good ELOS: 10 days  Assessment Clinical Impression: Problem List:  Patient Active Problem List    Diagnosis  Date Noted   .  Thrombotic cerebral infarction  09/23/2012   .  DM type 2 causing neurological disease  08/26/2012   .  Stroke    .  Hypertension     Past Medical History:  Past Medical History   Diagnosis  Date   .  Stroke    .  Hypertension    .  Diabetes mellitus     Past Surgical History: History reviewed. No pertinent past surgical history.  Assessment & Plan  Clinical Impression: 62 y.o. right handed female with history of hypertension as well as diabetes mellitus with peripheral neuropathy. Patient history multi-CVA with right-sided weakness residual and recent admission inpatient rehabilitation services discharged 09/10/2012 on Aggrenox therapy for CVA prophylaxis. Patient was using a walker at home. Outpatient therapy had been arranged the patient did not have the necessary transportation. Admitted 09/22/2012 with severe headache and increasing left-sided weakness. MRI of the brain showed acute ischemic infarct posterior limb of the right internal capsule versus extension of previous infarct as well as stable appearance of additional remote infarcts. Patient did not receive TPA. Neurology service followup presently on aspirin therapy with the addition of subcutaneous Lovenox for DVT prophylaxis and full workup ongoing. Patient maintained on a regular consistency diet. Patient transferred to CIR on 09/22/2012.   Pt presents with decreased activity tolerance, decreased functional mobility, decreased balance, decreased coordination Limiting pt's independence with leisure/community pursuits.  Plan Min 1 time per week > 20 min  Recommendations for other services: None  Discharge Criteria: Patient will be  discharged from TR if patient refuses treatment 3 consecutive times without medical reason.  If treatment goals not met, if there is a change in medical status, if patient makes no progress towards goals or if patient is discharged from hospital.  The above assessment, treatment plan, treatment alternatives and goals were discussed and mutually agreed upon: by patient  Pranay Hilbun 09/28/2012, 11:23 AM

## 2012-09-28 NOTE — Progress Notes (Signed)
Physical Therapy Session Note  Patient Details  Name: Sophia Rodriguez MRN: [id number] Date of Birth: [birth year]  Today's Date: 09/28/2012 Time:1352-1415 and 4098-1191 Time Calculation (min): 23 and 53 min  Short Term Goals: Week 1:  PT Short Term Goal 1 (Week 1): = LTGs  Skilled Therapeutic Interventions/Progress Updates:   1st session: focus on NMR for weight shifting, initiation and activation of RLE; see below for details.  2nd session: Focus on higher level gait and weight shift training with use of wall rail for support during R and L lateral stepping and retro stepping x 25' each direction with mod A and manual facilitation and verbal cues for initiation, full R and LLE step length and foot clearance and for full lateral and posterior weight shifting with pelvic rotation.  Pt demonstrating increased freezing moments and increased difficulty with lateral weight shifting as she fatigued.  Performed gait with poles and canes on floor as visual cue for full step and stride length and foot clearance x 15' multiple repetitions with UE support on hand rail and HHA with focus on increasing momentum and maintaining weight shifting and full step and stride length bilaterally when visual cues not present x 150' with L HHA and verbal cues for stepping initiation during straight navigation and pivoting and manual facilitation for lateral weight shifting.  Transferred to mat with min HHA, cues for pivoting to R, and performed sit <> supine flat mat supervision.  Performed 10 reps bilat LE bridges and RLE unilateral bridges for gluteal strengthening.  Returned to w/c min A and to room in w/c.    Therapy Documentation Precautions:  Precautions Precautions: Fall Precaution Comments: pt went home from CIR and does not note falls but did note feeling uneasy at home Required Braces or Orthoses: Other Brace/Splint Other Brace/Splint: right AFO Restrictions Weight Bearing Restrictions: No Vital  Signs: Therapy Vitals Temp: 98 F (36.7 C) Temp src: Oral Pulse Rate: 86 Resp: 20 BP: 109/73 mmHg Patient Position, if appropriate: Lying Oxygen Therapy SpO2: 99 % O2 Device: None (Room air) Pain: Pain Assessment Pain Assessment: No/denies pain Locomotion : Ambulation Ambulation/Gait Assistance: 3: Mod assist  Other Treatments: Treatments Neuromuscular Facilitation: Right;Left;Lower Extremity;Activity to increase coordination;Activity to increase motor control;Activity to increase timing and sequencing;Activity to increase sustained activation;Activity to increase lateral weight shifting;Activity to increase anterior-posterior weight shifting beginning with stair negotiation up and down 3 short stairs x 3 reps with 2 rails leading with RLE to ascend and descend for visual and automatic task for RLE full step length and clearance with min A for lateral and anterior weight shifting. Continued NMR and dynamic standing balance and weight shift training with alternating LE ball kicks to wall with mod A and manual facilitation for lateral weight shifting prior to kicking.  Also performed modified single limb stance with R and then L foot on ball and performing ball roll outs/in with L HHA for balance with patient demonstrating poor ability to maintain R terminal hip extension during R stance.    See FIM for current functional status  Therapy/Group: Individual Therapy  Edman [address] [facility] 09/28/2012, 4:59 PM

## 2012-09-28 NOTE — Progress Notes (Signed)
Occupational Therapy Session Note  Patient Details  Name: Sophia Rodriguez MRN: [id number] Date of Birth: 27-May-1950  Today's Date: 09/28/2012 Time: 0800-0900 Time Calculation (min): 60 min  Short Term Goals: Week 1:  OT Short Term Goal 1 (Week 1): Bath:  Supervision to include sit & stand in shower as well as set up using RW. OT Short Term Goal 2 (Week 1): UB & LB Dressing:  Supervision to include sit & stand as well as set up using RW. OT Short Term Goal 3 (Week 1): Groom:  Stand at sink for at least 3 tasks with supervision. OT Short Term Goal 4 (Week 1): Toilet transfers:  Supervision with RW OT Short Term Goal 5 (Week 1): IADL:  Supervision with simple HM and kitchen tasks with RW.  Skilled Therapeutic Interventions/Progress Updates:  Patient resting in bed upon arrival. Engaged in self care retraining to include toilet transfer, toileting, shower, dress and groom tasks. Patient reports that PTA she does not don AFO until after her shower so her routine is to ambulate to bathroom to toilet, shower, brush teeth then ambulates with RW and bare feet to sit EOB to dress and donn AFO.    She does all of this before she goes into the kitchen for breakfast. Patient reports RLE often "slow" first thing in the mornings. Without AFO, she demonstrates right foot drag, decreased step length and shuffling gait while ambulating with RW yet with no LOB. Patient finished session by cleaning up her room after shower.  RLE often only advances ~1/3 the distance needed to complete a normal swing through. When patient is just focusing on weight shifts and swing through without thinking/completing a functional task, she can more often complete a full or almost full swing through.   Therapy Documentation Precautions:  Precautions Precautions: Fall Precaution Comments: pt went home from CIR and does not note falls but did note feeling uneasy at home Required Braces or Orthoses: Other Brace/Splint Other  Brace/Splint: right AFO Restrictions Weight Bearing Restrictions: No Pain: 6/10 headache, declines medication, proceeded with session at a slow pace with rest breaks.  RN aware ADL: See FIM for current functional status  Therapy/Group: Individual Therapy  Saleha Kalp 09/28/2012, 8:23 AM

## 2012-09-28 NOTE — Progress Notes (Signed)
Occupational Therapy Session Note  Patient Details  Name: Sophia Rodriguez MRN: [id number] Date of Birth: 04/03/1950  Today's Date: 09/28/2012 Time: 4098-1191 Time Calculation (min): 43 min  Short Term Goals: Week 1:  OT Short Term Goal 1 (Week 1): Bath:  Supervision to include sit & stand in shower as well as set up using RW. OT Short Term Goal 2 (Week 1): UB & LB Dressing:  Supervision to include sit & stand as well as set up using RW. OT Short Term Goal 3 (Week 1): Groom:  Stand at sink for at least 3 tasks with supervision. OT Short Term Goal 4 (Week 1): Toilet transfers:  Supervision with RW OT Short Term Goal 5 (Week 1): IADL:  Supervision with simple HM and kitchen tasks with RW.  Skilled Therapeutic Interventions/Progress Updates:    Pt seen for 1:1 OT with focus on gross motor control, FMC, standing tolerance, and dynamic standing balance.  Engaged in therapeutic activity in standing at high-low table alternating use of RUE and LUE with table top activity, when RUE not in use, engaging in WB through RUE.  Pt with dropped item with Rt hand a few times secondary to decreased attention and when reaching outside BOS to Rt.  Pt making more errors as activity progressed and reports not focusing as well (as therapy gym got busier).  Engaged in dynamic standing balance activity with hanging towels on clothes line.  Had pt reach up to Lt with Rt hand to obtain towels and sidestep along counter while hanging towels with resistive clothespins.  Close supervision when reaching up to Lt and rotating to reach for clothespins.  Ambulated with HHA approx 20 feet with min cues for RLE clearance.  Therapy Documentation Precautions:  Precautions Precautions: Fall Precaution Comments: pt went home from CIR and does not note falls but did note feeling uneasy at home Required Braces or Orthoses: Other Brace/Splint Other Brace/Splint: right AFO Restrictions Weight Bearing Restrictions: No Pain:  Pt with  no c/o pain this session.  See FIM for current functional status  Therapy/Group: Individual Therapy  Leonette Monarch 09/28/2012, 1:52 PM

## 2012-09-28 NOTE — Progress Notes (Signed)
Patient ID: Sophia Rodriguez, female   DOB: September 26, 1950, 62 y.o.   MRN: [id number] Subjective/Complaints: 62 y.o. 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 She was admitted on 08/26/12 with 24 hour history of difficulty walking, decrease in balance as well as increased right sided weakness. MRI of the brain showed acute nonhemorrhagic infarct at the junction of the right thalamus and posterior limb of the right internal capsule as well as remote moderate to large size infarct medial aspect left frontal and parietal lobes as well as left globus pallidus and remote left paracentral small infarct. MRA of the head with atherosclerotic type changes. MRA of the neck without significant stenosis. Echocardiogram with EF 60-65% and source of emboli. Carotid Dopplers with no ICA stenosis. Neurology recommended changing patient to aggrenox for thrombotic stroke due to SVD Extension of R thalamic and PLIC infarct 7/1  Had left arm pain last noc, did not localize to a joint Resolved today.  Review of Systems  Neurological: Positive for focal weakness.  All other systems reviewed and are negative.    Objective: Vital Signs: Blood pressure 103/65, pulse 79, temperature 98 F (36.7 C), temperature source Oral, resp. rate 18, height 5\' 6"  (1.676 m), weight 73.891 kg (162 lb 14.4 oz), SpO2 96.00%. No results found. Results for orders placed during the hospital encounter of 09/22/12 (from the past 72 hour(s))  GLUCOSE, CAPILLARY     Status: Abnormal   Collection Time    09/25/12 11:56 AM      Result Value Range   Glucose-Capillary 118 (*) 70 - 99 mg/dL  GLUCOSE, CAPILLARY     Status: None   Collection Time    09/25/12  4:48 PM      Result Value Range   Glucose-Capillary 79  70 - 99 mg/dL  GLUCOSE, CAPILLARY     Status: Abnormal   Collection Time    09/25/12  8:29 PM   Result Value Range   Glucose-Capillary 116 (*) 70 - 99 mg/dL   Comment 1 Notify RN    GLUCOSE, CAPILLARY     Status: None   Collection Time    09/26/12  7:27 AM      Result Value Range   Glucose-Capillary 93  70 - 99 mg/dL   Comment 1 Notify RN    GLUCOSE, CAPILLARY     Status: Abnormal   Collection Time    09/26/12 11:19 AM      Result Value Range   Glucose-Capillary 140 (*) 70 - 99 mg/dL   Comment 1 Notify RN    GLUCOSE, CAPILLARY     Status: None   Collection Time    09/26/12  4:42 PM      Result Value Range   Glucose-Capillary 90  70 - 99 mg/dL  GLUCOSE, CAPILLARY     Status: Abnormal   Collection Time    09/26/12  8:24 PM      Result Value Range   Glucose-Capillary 201 (*) 70 - 99 mg/dL   Comment 1 Notify RN    GLUCOSE, CAPILLARY     Status: Abnormal   Collection Time    09/27/12  7:18 AM      Result Value Range   Glucose-Capillary 100 (*) 70 - 99 mg/dL   Comment 1 Notify RN    GLUCOSE, CAPILLARY     Status: None   Collection Time    09/27/12 11:30  AM      Result Value Range   Glucose-Capillary 94  70 - 99 mg/dL   Comment 1 Notify RN    GLUCOSE, CAPILLARY     Status: None   Collection Time    09/27/12  4:37 PM      Result Value Range   Glucose-Capillary 89  70 - 99 mg/dL  GLUCOSE, CAPILLARY     Status: Abnormal   Collection Time    09/27/12  9:23 PM      Result Value Range   Glucose-Capillary 123 (*) 70 - 99 mg/dL  GLUCOSE, CAPILLARY     Status: Abnormal   Collection Time    09/28/12  7:11 AM      Result Value Range   Glucose-Capillary 103 (*) 70 - 99 mg/dL   Comment 1 Notify RN        Nursing note and vitals reviewed.  Constitutional: She is oriented to person, place, and time. She appears well-developed and well-nourished.  HENT:  Head: Normocephalic and atraumatic.  Eyes: Pupils are equal, round, and reactive to light.  Neck: Normal range of motion. Neck supple.  Cardiovascular: Normal rate and regular rhythm.  Pulmonary/Chest: Effort normal and  breath sounds normal. No respiratory distress. She has no wheezes.  Abdominal: Soft. Bowel sounds are normal.  Musculoskeletal: She exhibits no edema and no tenderness. Normal appearing arches, feet--non tender with ROM/palpation, R arm without swelling or tenderness to palpation.    Neurological: She is alert and oriented to person, place, and time.  Soft spoken. Speech clear. Some delay in initiation but able to follow commands without difficulty. LUE weakness with decrease in fine motor control. RLE with minimal weakness.  Skin: Skin is warm and dry.  Psychiatric: Her speech is normal and behavior is normal. Thought content normal. Her mood appears a little flat. Cognition and memory are normal.  Motor strength: 4/5 in the right deltoid, biceps, triceps, grip, hip flexor, knee extensors, 3-/5 ankle dorsiflexor plantar flexor  4 minus/5 in the left deltoid, biceps, triceps, grip, hip flexor, knee extensors, 1/5 ankle dorsiflexor and plantar flexor  Decreased sensory Left great toe  Cerebellar testing no evidence of ataxia on finger nose to finger and heel-to-shin testing are   Assessment/Plan: 1. 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  FIM - Upper Body Dressing/Undressing Upper body dressing/undressing steps patient completed: Thread/unthread right bra strap;Thread/unthread left bra strap;Hook/unhook bra;Thread/unthread right sleeve of pullover shirt/dresss;Thread/unthread left sleeve of pullover shirt/dress;Put head through opening of pull over shirt/dress;Pull shirt over trunk Upper body dressing/undressing: 5: Set-up assist to: Obtain clothing/put away FIM - Lower Body Dressing/Undressing Lower body dressing/undressing steps patient completed: Thread/unthread right underwear  leg;Thread/unthread left underwear leg;Pull underwear up/down;Thread/unthread right pants leg;Thread/unthread left pants leg;Pull pants up/down;Don/Doff right sock;Don/Doff left sock;Don/Doff right shoe;Don/Doff left shoe;Fasten/unfasten right shoe;Fasten/unfasten left shoe Lower body dressing/undressing: 5: Set-up assist to: Don/Doff TED stocking  FIM - Toileting Toileting steps completed by patient: Adjust clothing prior to toileting;Performs perineal hygiene;Adjust clothing after toileting Toileting Assistive Devices: Grab bar or rail for support Toileting: 5: Supervision: Safety issues/verbal cues  FIM - Diplomatic Services operational officer Devices: Art gallery manager Transfers: 4-To toilet/BSC: Min A (steadying Pt. > 75%);4-From toilet/BSC: Min A (steadying Pt. > 75%)  FIM - Banker Devices: Walker;Arm rests Bed/Chair  Transfer: 5: Chair or W/C > Bed: Supervision (verbal cues/safety issues)  FIM - Locomotion: Wheelchair Distance: 50 Locomotion: Wheelchair: 2: Travels 50 - 149 ft with supervision, cueing or coaxing FIM - Locomotion: Ambulation Locomotion: Ambulation Assistive Devices: Designer, industrial/product Ambulation/Gait Assistance: 5: Supervision Locomotion: Ambulation: 2: Travels 50 - 149 ft with minimal assistance (Pt.>75%)  Comprehension Comprehension Mode: Auditory Comprehension: 5-Follows basic conversation/direction: With extra time/assistive device  Expression Expression Mode: Verbal Expression: 5-Expresses basic needs/ideas: With extra time/assistive device  Social Interaction Social Interaction: 5-Interacts appropriately 90% of the time - Needs monitoring or encouragement for participation or interaction.  Problem Solving Problem Solving: 5-Solves basic 90% of the time/requires cueing < 10% of the time  Memory Memory: 6-More than reasonable amt of time  Medical Problem List and Plan:  1. DVT Prophylaxis/Anticoagulation:  Pharmaceutical: Lovenox  2. Pain Management: Will use tylenol prn for pain. Added voltaren gel for left wrist pain. --controlled at present 3. Mood: Flat affect with underlying anxiety noted. Will continue celexa and klonopin. Needs ego support. Will have LCSW follow up for evaluation.  4. Neuropsych: This patient is capable of making decisions on his/her own behalf.  5. DM type 2 with neuropathy: Monitor BS with AC/HS checks. Off metformin. Under control  -follow for increased pain---minor at present. 6. HTN: Will monitor with bid checks. on low dose ace for better control will close monitoring for hypotension. .  7. Dyslipidemia: LDL--128.   Lipitor  . 8.  Left foot drop new, prob secondary to CVA, also has DM with neuropathy, will discuss need for AFO with PT  9.  Right arm pain may be neurogenic, no trauma, exam unremarkable, monitor    LOS (Days) 6 A FACE TO FACE EVALUATION WAS PERFORMED  Loel Betancur E 09/28/2012, 9:36 AM

## 2012-09-29 ENCOUNTER — Inpatient Hospital Stay (HOSPITAL_COMMUNITY): Payer: Medicare Other | Admitting: Occupational Therapy

## 2012-09-29 ENCOUNTER — Inpatient Hospital Stay (HOSPITAL_COMMUNITY): Payer: Medicare Other

## 2012-09-29 LAB — GLUCOSE, CAPILLARY
Glucose-Capillary: 92 mg/dL (ref 70–99)
Glucose-Capillary: 89 mg/dL (ref 70–99)

## 2012-09-29 LAB — CREATININE, SERUM: GFR calc non Af Amer: 63 mL/min — ABNORMAL LOW (ref >90)

## 2012-09-29 NOTE — Progress Notes (Signed)
Patient ID: Sophia Rodriguez, female   DOB: 1950-08-16, 62 y.o.   MRN: [id number] Subjective/Complaints: 62 y.o. 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 She was admitted on 08/26/12 with 24 hour history of difficulty walking, decrease in balance as well as increased right sided weakness. MRI of the brain showed acute nonhemorrhagic infarct at the junction of the right thalamus and posterior limb of the right internal capsule as well as remote moderate to large size infarct medial aspect left frontal and parietal lobes as well as left globus pallidus and remote left paracentral small infarct. MRA of the head with atherosclerotic type changes. MRA of the neck without significant stenosis. Echocardiogram with EF 60-65% and source of emboli. Carotid Dopplers with no ICA stenosis. Neurology recommended changing patient to aggrenox for thrombotic stroke due to SVD Extension of R thalamic and PLIC infarct 7/1 No issues overnite  Review of Systems  Neurological: Positive for focal weakness.  All other systems reviewed and are negative.    Objective: Vital Signs: Blood pressure 114/77, pulse 77, temperature 98.3 F (36.8 C), temperature source Oral, resp. rate 18, height 5\' 6"  (1.676 m), weight 73.891 kg (162 lb 14.4 oz), SpO2 96.00%. No results found. Results for orders placed during the hospital encounter of 09/22/12 (from the past 72 hour(s))  GLUCOSE, CAPILLARY     Status: None   Collection Time    09/26/12  4:42 PM      Result Value Range   Glucose-Capillary 90  70 - 99 mg/dL  GLUCOSE, CAPILLARY     Status: Abnormal   Collection Time    09/26/12  8:24 PM      Result Value Range   Glucose-Capillary 201 (*) 70 - 99 mg/dL   Comment 1 Notify RN    GLUCOSE, CAPILLARY     Status: Abnormal   Collection Time    09/27/12  7:18 AM      Result Value Range   Glucose-Capillary 100 (*) 70 - 99 mg/dL   Comment 1 Notify RN    GLUCOSE, CAPILLARY     Status: None   Collection Time    09/27/12 11:30 AM      Result Value Range   Glucose-Capillary 94  70 - 99 mg/dL   Comment 1 Notify RN    GLUCOSE, CAPILLARY     Status: None   Collection Time    09/27/12  4:37 PM      Result Value Range   Glucose-Capillary 89  70 - 99 mg/dL  GLUCOSE, CAPILLARY     Status: Abnormal   Collection Time    09/27/12  9:23 PM      Result Value Range   Glucose-Capillary 123 (*) 70 - 99 mg/dL  GLUCOSE, CAPILLARY     Status: Abnormal   Collection Time    09/28/12  7:11 AM      Result Value Range   Glucose-Capillary 103 (*) 70 - 99 mg/dL   Comment 1 Notify RN    GLUCOSE, CAPILLARY     Status: None   Collection Time    09/28/12 11:26 AM      Result Value Range   Glucose-Capillary 92  70 - 99 mg/dL   Comment 1 Notify RN    GLUCOSE, CAPILLARY     Status: Abnormal   Collection Time    09/28/12  4:40 PM      Result Value  Range   Glucose-Capillary 104 (*) 70 - 99 mg/dL   Comment 1 Notify RN    GLUCOSE, CAPILLARY     Status: Abnormal   Collection Time    09/28/12  8:57 PM      Result Value Range   Glucose-Capillary 124 (*) 70 - 99 mg/dL   Comment 1 Notify RN    CREATININE, SERUM     Status: Abnormal   Collection Time    09/29/12  5:20 AM      Result Value Range   Creatinine, Ser 0.96  0.50 - 1.10 mg/dL   GFR calc non Af Amer 63 (*) >90 mL/min   GFR calc Af Amer 73 (*) >90 mL/min   Comment:            The eGFR has been calculated     using the CKD EPI equation.     This calculation has not been     validated in all clinical     situations.     eGFR's persistently     <90 mL/min signify     possible Chronic Kidney Disease.  GLUCOSE, CAPILLARY     Status: None   Collection Time    09/29/12  7:32 AM      Result Value Range   Glucose-Capillary 92  70 - 99 mg/dL   Comment 1 Notify RN    GLUCOSE, CAPILLARY     Status: None   Collection Time    09/29/12  11:58 AM      Result Value Range   Glucose-Capillary 83  70 - 99 mg/dL   Comment 1 Notify RN        Nursing note and vitals reviewed.  Constitutional: She is oriented to person, place, and time. She appears well-developed and well-nourished.  HENT:  Head: Normocephalic and atraumatic.  Eyes: Pupils are equal, round, and reactive to light.  Neck: Normal range of motion. Neck supple.  Cardiovascular: Normal rate and regular rhythm.  Pulmonary/Chest: Effort normal and breath sounds normal. No respiratory distress. She has no wheezes.  Abdominal: Soft. Bowel sounds are normal.  Musculoskeletal: She exhibits no edema and no tenderness. Normal appearing arches, feet--non tender with ROM/palpation, R arm without swelling or tenderness to palpation.    Neurological: She is alert and oriented to person, place, and time.  Soft spoken. Speech clear. Some delay in initiation but able to follow commands without difficulty. LUE weakness with decrease in fine motor control. RLE with minimal weakness.  Skin: Skin is warm and dry.  Psychiatric: Her speech is normal and behavior is normal. Thought content normal. Her mood appears a little flat. Cognition and memory are normal.  Motor strength: 4/5 in the right deltoid, biceps, triceps, grip, hip flexor, knee extensors, 3-/5 ankle dorsiflexor plantar flexor  4 minus/5 in the left deltoid, biceps, triceps, grip, hip flexor, knee extensors, 1/5 ankle dorsiflexor and plantar flexor  Decreased sensory Left great toe  Cerebellar testing no evidence of ataxia on finger nose to finger and heel-to-shin testing are   Assessment/Plan: 1. 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 Team conference today please see physician documentation under team conference  tab, met with team face-to-face to discuss problems,progress, and goals. Formulized individual treatment plan based on medical history, underlying problem and comorbidities.  FIM - Upper  Body Dressing/Undressing Upper body dressing/undressing steps patient completed: Thread/unthread right bra strap;Thread/unthread left bra strap;Hook/unhook bra;Thread/unthread right sleeve of pullover shirt/dresss;Thread/unthread left sleeve of pullover shirt/dress;Put head through opening of pull over shirt/dress;Pull shirt over trunk Upper body dressing/undressing: 5: Set-up assist to: Obtain clothing/put away FIM - Lower Body Dressing/Undressing Lower body dressing/undressing steps patient completed: Thread/unthread right underwear leg;Thread/unthread left underwear leg;Pull underwear up/down;Thread/unthread right pants leg;Thread/unthread left pants leg;Pull pants up/down;Don/Doff right sock;Don/Doff left sock;Don/Doff right shoe;Don/Doff left shoe;Fasten/unfasten right shoe;Fasten/unfasten left shoe Lower body dressing/undressing: 5: Set-up assist to: Don/Doff TED stocking  FIM - Toileting Toileting steps completed by patient: Adjust clothing prior to toileting;Performs perineal hygiene;Adjust clothing after toileting Toileting Assistive Devices: Grab bar or rail for support Toileting: 5: Supervision: Safety issues/verbal cues  FIM - Diplomatic Services operational officer Devices: Best boy Transfers: 4-To toilet/BSC: Min A (steadying Pt. > 75%);4-From toilet/BSC: Min A (steadying Pt. > 75%)  FIM - Bed/Chair Transfer Bed/Chair Transfer Assistive Devices: Therapist, occupational: 4: Bed > Chair or W/C: Min A (steadying Pt. > 75%);4: Chair or W/C > Bed: Min A (steadying Pt. > 75%)  FIM - Locomotion: Wheelchair Distance: 50 Locomotion: Wheelchair: 1: Total Assistance/staff pushes wheelchair (Pt<25%) FIM - Locomotion: Ambulation Locomotion: Ambulation Assistive Devices: Other (comment)  (wall rail, HHA) Ambulation/Gait Assistance: 3: Mod assist Locomotion: Ambulation: 3: Travels 150 ft or more with moderate assistance (Pt: 50 - 74%)  Comprehension Comprehension Mode: Auditory Comprehension: 5-Follows basic conversation/direction: With extra time/assistive device  Expression Expression Mode: Verbal Expression: 5-Expresses basic needs/ideas: With extra time/assistive device  Social Interaction Social Interaction: 5-Interacts appropriately 90% of the time - Needs monitoring or encouragement for participation or interaction.  Problem Solving Problem Solving: 5-Solves basic 90% of the time/requires cueing < 10% of the time  Memory Memory: 6-More than reasonable amt of time  Medical Problem List and Plan:  1. DVT Prophylaxis/Anticoagulation: Pharmaceutical: Lovenox  2. Pain Management: Will use tylenol prn for pain. Added voltaren gel for left wrist pain. --controlled at present 3. Mood: Flat affect with underlying anxiety noted. Will continue celexa and klonopin. Needs ego support. Will have LCSW follow up for evaluation.  4. Neuropsych: This patient is capable of making decisions on his/her own behalf.  5. DM type 2 with neuropathy: Monitor BS with AC/HS checks. Off metformin. Under control  -follow for increased pain---minor at present. 6. HTN: Will monitor with bid checks. on low dose ace for better control will close monitoring for hypotension. .  7. Dyslipidemia: LDL--128.   Lipitor  . 8.  Left foot drop improving, prob secondary to CVA, also has DM with neuropathy,no further need for AFO  9.  Right arm pain may be neurogenic, no trauma, exam unremarkable, monitor    LOS (Days) 7 A FACE TO FACE EVALUATION WAS PERFORMED  Najeeb Uptain E 09/29/2012, 2:31 PM

## 2012-09-29 NOTE — Progress Notes (Signed)
Physical Therapy Session Note  Patient Details  Name: Sophia Rodriguez MRN: [id number] Date of Birth: [birth year]  Today's Date: 09/29/2012 Time: 1430-1530 Time Calculation (min): 60 min  Short Term Goals: Week 1:  PT Short Term Goal 1 (Week 1): = LTGs LTGs have been downgraded due to pt's inconsistency of functional mobility, problems with initiation due to Parkinson-like symptoms due to multiple BG infarcts.  Skilled Therapeutic Interventions/Progress Updates:   Treatment focused on neuromuscular re-education via demo, VCs, tactile cues for RLE swing phase components of gait, assessment of current ALLARD Toe Off AFO for RLE.  Scarlette Slice observed pt; gait does not seem very much improved by AFO.  She continues with short steps, steppage pattern, absent trunk rotation.  Thayer Ohm will add toe caps to shoes bil to improve bil foot clearance.  Gait with RW x 162' with supervision, in non skid socks without AFO with good foot clearance bil, cues for upright posture and forward gaze, small transitional steps during turns.  stand > sit to w/c x 3 without use of UEs, min> supervision.  R and LLE step- over movements using 6" high bolster on floor, 1UE support.  Kinetron at resistance 40 cm/sec in standing, limited excursion, working on full wt shift L><R and sustained RLE wt bearing    Therapy Documentation Precautions:  Precautions Precautions: Fall Precaution Comments: pt went home from CIR and does not note falls but did note feeling uneasy at home Required Braces or Orthoses: Other Brace/Splint Other Brace/Splint: right AFO Restrictions Weight Bearing Restrictions: No      Other Treatments: Treatments Neuromuscular Facilitation: Right;Left;Upper Extremity;Lower Extremity;Forced use;Activity to increase coordination;Activity to increase motor control;Activity to increase timing and sequencing;Activity to increase sustained activation;Activity to increase grading;Activity to increase lateral  weight shifting;Activity to increase anterior-posterior weight shifting Weight Bearing Technique Weight Bearing Technique: Yes RUE Weight Bearing Technique: Extended arm standing LUE Weight Bearing Technique: Extended arm standing  See FIM for current functional status  Therapy/Group: Individual Therapy  Jaxan Michel 09/29/2012, 3:41 PM

## 2012-09-29 NOTE — Progress Notes (Signed)
Occupational Therapy Session Notes  Patient Details  Name: Brisha Mccabe MRN: [id number] Date of Birth: 17-Apr-1950  Today's Date: 09/29/2012 Time: 0800-0900 and 1000-1030 and 100-145 Time Calculation (min): 60 min and 30 min and 45 min  Short Term Goals: Week 1:  OT Short Term Goal 1 (Week 1): Bath:  Supervision to include sit & stand in shower as well as set up using RW. OT Short Term Goal 2 (Week 1): UB & LB Dressing:  Supervision to include sit & stand as well as set up using RW. OT Short Term Goal 3 (Week 1): Groom:  Stand at sink for at least 3 tasks with supervision. OT Short Term Goal 4 (Week 1): Toilet transfers:  Supervision with RW OT Short Term Goal 5 (Week 1): IADL:  Supervision with simple HM and kitchen tasks with RW.  Skilled Therapeutic Interventions/Progress Updates:  1)  Patient sitting EOB upon arrival.  Self care retraining to include ambulate to toilet with RW and no AFO, shower, ambulate in room with RW, no AFO and barefoot to groom at sink and gather clothes for dressing EOB.  Patient supervision-Mod I with above tasks.  Supervision when ambulating and picking items up off the floor.  2)  In preparation for IADL, patient ambulated without AFO and either HHA, assist at hips from behind and use of railing in hallway in stocking feet.  Focused on right & left lateral weight shifts, one legged stance with and without physical assist with patient holding on to the rails, directional changes, walking backwards and turning right and left.   3)  Ambulated with RW and without AFO to bathroom for toileting with supervision-distant supervision, then ambulated with RW to therapy kitchen to engage in later weight shifts while reaching in upper cabinets to include up on tip toes, then down to lower cabinets with squats to include more lateral weight shifts, side-steps to left and right also see infor below: Neuromuscular Facilitation: Right;Left;Upper Extremity;Lower Extremity;Forced  use;Activity to increase coordination;Activity to increase motor control;Activity to increase timing and sequencing;Activity to increase sustained activation;Activity to increase grading;Activity to increase lateral weight shifting;Activity to increase anterior-posterior weight shifting RUE Weight Bearing Technique: Extended arm standing LUE Weight Bearing Technique: Extended arm standing  Therapy Documentation Precautions:  Precautions Precautions: Fall Precaution Comments: pt went home from CIR and does not note falls but did note feeling uneasy at home Required Braces or Orthoses: Other Brace/Splint Other Brace/Splint: right AFO Restrictions Weight Bearing Restrictions: No Pain: 1)  Denies pain  2) Denies pain, 3) Denies pain  See FIM for current functional status  Therapy/Group: Individual Therapy for all 3 sessions  Tykesha Konicki 09/29/2012, 2:27 PM

## 2012-09-30 ENCOUNTER — Inpatient Hospital Stay (HOSPITAL_COMMUNITY): Payer: Medicare Other | Admitting: Occupational Therapy

## 2012-09-30 ENCOUNTER — Inpatient Hospital Stay (HOSPITAL_COMMUNITY): Payer: Medicare Other

## 2012-09-30 LAB — GLUCOSE, CAPILLARY
Glucose-Capillary: 93 mg/dL (ref 70–99)
Glucose-Capillary: 155 mg/dL — ABNORMAL HIGH (ref 70–99)

## 2012-09-30 MED ORDER — GLUCERNA SHAKE PO LIQD
237.0000 mL | Freq: Two times a day (BID) | ORAL | Status: DC
  Administered 2012-09-30 – 2012-10-04 (×5): 237 mL via ORAL

## 2012-09-30 NOTE — Progress Notes (Signed)
Social Work Patient ID: Sophia Rodriguez, female   DOB: 03-Jul-1950, 62 y.o.   MRN: [id number] Met with pt yesterday to discuss team conference goals-supervision/mod/i level and discharge 7/15.  Pt wants to see how brace that she Is getting tomorrow will work.  Main issue is her safety at home alone and confidence.  Discussed short term NHP and benefits of this.  She wants to  Think about.  Will check back with her today.

## 2012-09-30 NOTE — Progress Notes (Signed)
Physical Therapy Weekly Progress Note  Patient Details  Name: Sophia Rodriguez MRN: [id number] Date of Birth: Jan 06, 1951  Today's Date: 09/30/2012 Time: 1100-1200 Time Calculation (min): 60 min  Patient has met 4 of 6 long term goals ; STGs not set due to original ELOS.  Patient continues to demonstrate the following deficits: strength, balance, wt shifting to RLe, functional mobility, activity tolerance and therefore will continue to benefit from skilled PT intervention to enhance overall performance with activity tolerance, balance, postural control, ability to compensate for deficits, functional use of  right upper extremity, right lower extremity, left upper extremity and left lower extremity and coordination.  Patient progressing toward long term goals..  Continue plan of care. LTGs will be reviewed daily to see if pt can be upgraded to mod I for safe d/c home with intermittent supervision.  Pt stated she feels confident about d/c'ing home next week, with the intermittent supervision/home assistance her daughter can provide.  PT Short Term Goals Week 1:  PT Short Term Goal 1 (Week 1): = LTGs  Skilled Therapeutic Interventions/Progress Updates:  Treatment focused on gait with newly adapted shoes (leather toe caps, neuromuscular re-education via tactile, VCs, demo for R and LLE motor control, transfers.  Gait with RW x 75', x 150' on level tile and carpet, without RAFO.  Pt demonstrated increased foot clearance on tile, but still decreased foot clearance on carpet, wearing adapted shoes.  Gait velocity = 10 m/ 27 seconds.  Standing activities biased toward R to facilitate full R trunk and LE extension functionally.  Gait x 165' returning to room, without AD, with supervision> min assist focusing on velocity, R wt shifting, r foot clearance.    Therapy Documentation Precautions:  Precautions Precautions: Fall Precaution Comments: pt went home from CIR and does not note falls but did  note feeling uneasy at home Required Braces or Orthoses: Other Brace/Splint Other Brace/Splint: right AFO Restrictions Weight Bearing Restrictions: No   Pain Assessment Pain Assessment: 0-10 Pain Score: 0-No pain Patients Stated Pain Goal: 0 Multiple Pain Sites: No    Balance: Standardized Balance Assessment Standardized Balance Assessment: Berg Balance Test Berg Balance Test Sit to Stand: Able to stand  independently using hands Standing Unsupported: Able to stand 2 minutes with supervision Sitting with Back Unsupported but Feet Supported on Floor or Stool: Able to sit safely and securely 2 minutes Stand to Sit: Uses backs of legs against chair to control descent Transfers: Able to transfer with verbal cueing and /or supervision Standing Unsupported with Eyes Closed: Able to stand 10 seconds with supervision Standing Ubsupported with Feet Together: Needs help to attain position but able to stand for 30 seconds with feet together From Standing, Reach Forward with Outstretched Arm: Reaches forward but needs supervision From Standing Position, Pick up Object from Floor: Able to pick up shoe, needs supervision From Standing Position, Turn to Look Behind Over each Shoulder: Looks behind one side only/other side shows less weight shift Turn 360 Degrees: Able to turn 360 degrees safely but slowly Standing Unsupported, Alternately Place Feet on Step/Stool: Needs assistance to keep from falling or unable to try Standing Unsupported, One Foot in Front: Needs help to step but can hold 15 seconds Standing on One Leg: Tries to lift leg/unable to hold 3 seconds but remains standing independently Total Score: 29 Patient demonstrates increased fall risk as noted by score of  29 /56 on Berg Balance Scale.  (<36= high risk for falls, close to 100%; 37-45 significant >80%; 46-51  moderate >50%; 52-55 lower >25%)   Other Treatments: Treatments Neuromuscular Facilitation: Right;Left;Upper  Extremity;Lower Extremity;Forced use;Activity to increase coordination;Activity to increase motor control;Activity to increase timing and sequencing;Activity to increase sustained activation;Activity to increase grading;Activity to increase lateral weight shifting;Activity to increase anterior-posterior weight shifting  See FIM for current functional status  Therapy/Group: Individual Therapy  Livie Vanderhoof 09/30/2012, 12:23 PM

## 2012-09-30 NOTE — Patient Care Conference (Signed)
Inpatient RehabilitationTeam Conference and Plan of Care Update Date: 09/30/2012   Time: 11:30 Am    Patient Name: Sophia Rodriguez      Medical Record Number: [id number]  Date of Birth: Apr 16, 1950 Sex: Female         Room/Bed: 4M06C/4M06C-01 Payor Info: Payor: MEDICARE / Plan: MEDICARE PART A AND B / Product Type: *No Product type* /    Admitting Diagnosis: CVA  Admit Date/Time:  09/22/2012  6:03 PM Admission Comments: No comment available   Primary Diagnosis:  Thrombotic cerebral infarction Principal Problem: Thrombotic cerebral infarction  Patient Active Problem List   Diagnosis Date Noted  . 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 Stroke   . Hypertension     Expected Discharge Date: Expected Discharge Date: 10/05/12  Team Members Present: Physician leading conference: Dr. Claudette Laws Social Worker Present: Dossie Der, LCSW Nurse Present: Gregor Hams, RN PT Present: Edman Circle, PT;Becky Joycelyn Das, [place] OT Present: Leonette Monarch, Felipa Eth, OT;Other (comment) Dorathy Daft Perkinson-OT) SLP Present: Fae Pippin, SLP     Current Status/Progress Goal Weekly Team Focus  Medical   Tolerating therapy without complaints. Left foot drop improving  Maintain medical stability and upgrade functional status to modified independent  Restart therapy program   Bowel/Bladder   Pt continent of B&B  will remain continent of B&B  will remain continent of B&B   Swallow/Nutrition/ Hydration     na        ADL's   Overall supervision with functional mobility during BADL/IADL.  Mod I overall  dynamic balance, walker safety, NMR, high level IADLs   Mobility   S overall  LTGs uprgraded to mod I overall, except car transfer, community ambulation, and community w/c mobility maintained at S  neuro re-ed, balance, gait, stairs, activity tolerance, motor planning   Communication     at baseline-speech not seeing         Safety/Cognition/ Behavioral Observations    na        Pain   denies pain  pain <3  monitor and assess pain qshift and prn   Skin   skin free from infection/breakdown  skin to remain free of infection/breakdown  monitor skin qshift and prn      *See Care Plan and progress notes for long and short-term goals.  Barriers to Discharge: Prior multiple CVA's with cognitive deficits    Possible Resolutions to Barriers:  Continue patient education and therapy    Discharge Planning/Teaching Needs:  Home with intermittent checks from daughter, needs to be mod/i level to return home      Team Discussion:  AFO consult-left foot drop on this admission.  Making good progress-comp strategies.  Safety at home main issue  Revisions to Treatment Plan:  None   Continued Need for Acute Rehabilitation Level of Care: The patient requires daily medical management by a physician with specialized training in physical medicine and rehabilitation for the following conditions: Daily direction of a multidisciplinary physical rehabilitation program to ensure safe treatment while eliciting the highest outcome that is of practical value to the patient.: Yes Daily medical management of patient stability for increased activity during participation in an intensive rehabilitation regime.: Yes Daily analysis of laboratory values and/or radiology reports with any subsequent need for medication adjustment of medical intervention for : Neurological problems  [name], [name] 09/30/2012, 12:51 PM

## 2012-09-30 NOTE — Progress Notes (Signed)
NUTRITION ASSESSMENT  DOCUMENTATION CODES Per approved criteria  -Not Applicable   INTERVENTION:  Discontinue Ensure Complete po BID, each supplement provides 350 kcal and 13 grams of protein.  Try Glucerna Shake PO BID, each supplement provides 220 kcal and 10 grams of protein.  NUTRITION DIAGNOSIS: Inadequate oral intake related to poor appetite as evidenced by 25-50% meal completion.  Monitor:  1.  Food/Beverage; pt meeting >/=90% estimated needs with tolerance. 2.  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  Patient continues with a poor appetite and poor oral intake. She has not been drinking the Ensure because it makes her blood sugar high. She agreed to try Glucerna Shake instead. Discussed the importance of adequate intake during rehab stay for healing and recovery.   Height: Ht Readings from Last 1 Encounters:  09/22/12 5\' 6"  (1.676 m)    Weight: Wt Readings from Last 1 Encounters:  09/29/12 166 lb 0.1 oz (75.3 kg)  09/22/12  162 lb 14.4 oz (73.891 kg)   BMI:  Body mass index is 26.81 kg/(m^2).  Estimated Nutritional Needs: Kcal: 1845-2060 Protein: 73-87g Fluid: ~2.0 L/day  Skin: intact  Diet Order: Carb Control   Intake/Output Summary (Last 24 hours) at 09/30/12 1207 Last data filed at 09/30/12 0800  Gross per 24 hour  Intake    360 ml  Output      0 ml  Net    360 ml    Last BM: 7/10   Labs:   Recent Labs Lab 09/29/12 0520  CREATININE 0.96    CBG (last 3)   Recent Labs  09/29/12 1708 09/29/12 2054 09/30/12 0754  GLUCAP 89 101* 124*    Scheduled Meds: . aspirin  300 mg Rectal Daily   Or  . aspirin  325 mg Oral Daily  . atorvastatin  40 mg Oral q1800  . citalopram  10 mg Oral Daily  . enoxaparin (LOVENOX) injection  40 mg Subcutaneous Q24H  . feeding supplement  237 mL Oral BID BM  . insulin aspart  0-9 Units Subcutaneous TID WC  .  multivitamin with minerals  1 tablet Oral Daily    Continuous Infusions: None   Joaquin Courts, RD, LDN, CNSC Pager [phone] After Hours Pager [phone]

## 2012-09-30 NOTE — Progress Notes (Signed)
Occupational Therapy Note  Patient Details  Name: Sophia Rodriguez MRN: [id number] Date of Birth: 1950/09/15 Today's Date: 09/30/2012  Time: 1300-1340 Pt denies pain Individual therapy  Pt seated in w/c upon arrival.  Pt amb with RW from room to ADL apartment.  Pt engaged in removing sheets and comforter on bed before remaking bed.  Pt completed task without AD but steady A from therapist.  Pt used bed to balance when walking around to complete task.  Discussed energy conservation strategies.  Pt required extra time to complete task but was very thorough and did not exhibit any unsafe behavior.  Pt acknowledged that she would request assistance at home to complete task.   Lavone Neri [facility] 09/30/2012, 2:57 PM

## 2012-09-30 NOTE — Progress Notes (Signed)
Occupational Therapy Weekly Progress Note And Treatment sessions  Patient Details  Name: Sophia Rodriguez MRN: [id number] Date of Birth: 06-22-1950  Today's Date: 09/30/2012 Time: 0800-0900 and 1000-1030 Time Calculation (min): 60 min and 30 min  Patient has met 5 of 5 short term goals.  Patient is currently overall Supervision with all BADL and Supervision-Min Assist with IADL tasks with RW.  Patient continues to demonstrate the following deficits: generalized weakness, hemiparesis of non dominant left side and recent h/o hemiparesis of dominant right side, decreased balance, decreased sensation and baseline cognition.   Therefore, patient will continue to benefit from skilled OT intervention to enhance overall performance with BADL and iADL.  Patient progressing toward long term goals.  Continue plan of care.  Discharge date has been set for Tuesday July 15 and the rehab team is unsure if patient will meet Mod I goals.  This clinician will keep OT LTGs at Mod I for the time being as patient is making nice progress.  OT Short Term Goals Week 1:   OT Short Term Goal 1 (Week 1): Bath:  Supervision to include sit & stand in shower as well as set up using RW. OT Short Term Goal 1 - Progress (Week 1): Met OT Short Term Goal 2 (Week 1): UB & LB Dressing:  Supervision to include sit & stand as well as set up using RW. OT Short Term Goal 2 - Progress (Week 1): Met OT Short Term Goal 3 (Week 1): Groom:  Stand at sink for at least 3 tasks with supervision. OT Short Term Goal 3 - Progress (Week 1): Met OT Short Term Goal 4 (Week 1): Toilet transfers:  Supervision with RW OT Short Term Goal 4 - Progress (Week 1): Met OT Short Term Goal 5 (Week 1): IADL:  Supervision with simple HM and kitchen tasks with RW. OT Short Term Goal 5 - Progress (Week 1): Met Week 2:  OT Short Term Goal 1 (Week 2): STGs=LTGs  Skilled Therapeutic Interventions/Progress Updates:  1)  Self care retraining to include shower,  groom and dress.  Focused session on walker safety during functional mobility with set up of clothes and items to complete dressing after shower, place towel on shower seat, ambulate without AFO and RW for all functional mobility and gripper socks.  Patient continues to occasionally require vcs to take bigger steps with RLE expecially when concentrating on BADL/IADL tasks, when maneuvering tight spaces, turning and backing up.  Resting in w/c with all items within reach.  2)  Ambulated with RW to therapy bathroom with tub/shower combo and shower curtain similar to set up at home.  Patient practiced tub bench transfer with supervision.  Plan for tomorrow to perform her BADL session in this therapy bathroom to simulate set up at home.  Ambulated to therapy kitchen to practice side stepping and reaching with focus on forced weight bearing onto RLE and forced use of RLE with sit><stands.  Patient ambulated back to her room with supervision with RW, no AFO and gripper socks as the orthotist still has her shoes!    Therapy Documentation Precautions:  Precautions Precautions: Fall Precaution Comments: pt went home from CIR and does not note falls but did note feeling uneasy at home Required Braces or Orthoses: Other Brace/Splint Other Brace/Splint: right AFO Restrictions Weight Bearing Restrictions: No Pain: 1) Denies pain  2) Denies pain ADL: See FIM for current functional status  Therapy/Group: Individual Therapy both sessions  Ahnna Dungan 09/30/2012, 9:38 AM

## 2012-09-30 NOTE — Progress Notes (Signed)
Patient ID: Sophia Rodriguez, female   DOB: 1951-01-08, 62 y.o.   MRN: [id number] Subjective/Complaints: 62 y.o. 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 62 She was admitted on 08/26/12 with 24 hour history of difficulty walking, decrease in balance as well as increased right sided weakness. MRI of the brain showed acute nonhemorrhagic infarct at the junction of the right thalamus and posterior limb of the right internal capsule as well as remote moderate to large size infarct medial aspect left frontal and parietal lobes as well as left globus pallidus and remote left paracentral small infarct. MRA of the head with atherosclerotic type changes. MRA of the neck without significant stenosis. Echocardiogram with EF 60-65% and source of emboli. Carotid Dopplers with no ICA stenosis. Neurology recommended changing patient to aggrenox for thrombotic stroke due to SVD Extension of R thalamic and PLIC infarct 7/1 No issues overnite  Review of Systems  Neurological: Positive for focal weakness.  All other systems reviewed and are negative.    Objective: Vital Signs: Blood pressure 113/75, pulse 75, temperature 98 F (36.7 C), temperature source Oral, resp. rate 20, height 5\' 6"  (1.676 m), weight 75.3 kg (166 lb 0.1 oz), SpO2 96.00%. No results found. Results for orders placed during the hospital encounter of 09/22/12 (from the past 72 hour(s))  GLUCOSE, CAPILLARY     Status: None   Collection Time    09/27/12 11:30 AM      Result Value Range   Glucose-Capillary 94  70 - 99 mg/dL   Comment 1 Notify RN    GLUCOSE, CAPILLARY     Status: None   Collection Time    09/27/12  4:37 PM      Result Value Range   Glucose-Capillary 89  70 - 99 mg/dL  GLUCOSE, CAPILLARY     Status: Abnormal   Collection Time    09/27/12  9:23 PM      Result Value Range   Glucose-Capillary  123 (*) 70 - 99 mg/dL  GLUCOSE, CAPILLARY     Status: Abnormal   Collection Time    09/28/12  7:11 AM      Result Value Range   Glucose-Capillary 103 (*) 70 - 99 mg/dL   Comment 1 Notify RN    GLUCOSE, CAPILLARY     Status: None   Collection Time    09/28/12 11:26 AM      Result Value Range   Glucose-Capillary 92  70 - 99 mg/dL   Comment 1 Notify RN    GLUCOSE, CAPILLARY     Status: Abnormal   Collection Time    09/28/12  4:40 PM      Result Value Range   Glucose-Capillary 104 (*) 70 - 99 mg/dL   Comment 1 Notify RN    GLUCOSE, CAPILLARY     Status: Abnormal   Collection Time    09/28/12  8:57 PM      Result Value Range   Glucose-Capillary 124 (*) 70 - 99 mg/dL   Comment 1 Notify RN    CREATININE, SERUM     Status: Abnormal   Collection Time    09/29/12  5:20 AM      Result Value Range   Creatinine, Ser 0.96  0.50 - 1.10 mg/dL   GFR calc non Af Amer 63 (*) >90 mL/min   GFR calc Af Amer 73 (*) >90 mL/min   Comment:  The eGFR has been calculated     using the CKD EPI equation.     This calculation has not been     validated in all clinical     situations.     eGFR's persistently     <90 mL/min signify     possible Chronic Kidney Disease.  GLUCOSE, CAPILLARY     Status: None   Collection Time    09/29/12  7:32 AM      Result Value Range   Glucose-Capillary 92  70 - 99 mg/dL   Comment 1 Notify RN    GLUCOSE, CAPILLARY     Status: None   Collection Time    09/29/12 11:58 AM      Result Value Range   Glucose-Capillary 83  70 - 99 mg/dL   Comment 1 Notify RN    GLUCOSE, CAPILLARY     Status: None   Collection Time    09/29/12  5:08 PM      Result Value Range   Glucose-Capillary 89  70 - 99 mg/dL   Comment 1 Notify RN    GLUCOSE, CAPILLARY     Status: Abnormal   Collection Time    09/29/12  8:54 PM      Result Value Range   Glucose-Capillary 101 (*) 70 - 99 mg/dL   Comment 1 Notify RN    GLUCOSE, CAPILLARY     Status: Abnormal   Collection Time     09/30/12  7:54 AM      Result Value Range   Glucose-Capillary 124 (*) 70 - 99 mg/dL   Comment 1 Notify RN        Nursing note and vitals reviewed.  Constitutional: She is oriented to person, place, and time. She appears well-developed and well-nourished.  HENT:  Head: Normocephalic and atraumatic.  Eyes: Pupils are equal, round, and reactive to light.  Neck: Normal range of motion. Neck supple.  Cardiovascular: Normal rate and regular rhythm.  Pulmonary/Chest: Effort normal and breath sounds normal. No respiratory distress. She has no wheezes.  Abdominal: Soft. Bowel sounds are normal.  Musculoskeletal: She exhibits no edema and no tenderness. Normal appearing arches, feet--non tender with ROM/palpation, R arm without swelling or tenderness to palpation.    Neurological: She is alert and oriented to person, place, and time.  Soft spoken. Speech clear. Some delay in initiation but able to follow commands without difficulty. LUE weakness with decrease in fine motor control. RLE with minimal weakness.  Skin: Skin is warm and dry.  Psychiatric: Her speech is normal and behavior is normal. Thought content normal. Her mood appears a little flat. Cognition and memory are normal.  Motor strength: 4/5 in the right deltoid, biceps, triceps, grip, hip flexor, knee extensors, 3-/5 ankle dorsiflexor plantar flexor  4 minus/5 in the left deltoid, biceps, triceps, grip, hip flexor, knee extensors, 4-/5 ankle dorsiflexor and plantar flexor  Decreased sensory Left great toe   Assessment/Plan: 1. 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   FIM - Upper Body Dressing/Undressing Upper body dressing/undressing steps patient completed: Thread/unthread right bra strap;Thread/unthread left bra  strap;Hook/unhook bra;Thread/unthread right sleeve of pullover shirt/dresss;Thread/unthread left sleeve of pullover shirt/dress;Put head through opening of pull over shirt/dress;Pull shirt over trunk Upper body dressing/undressing: 5: Set-up assist to: Obtain clothing/put away FIM -  Lower Body Dressing/Undressing Lower body dressing/undressing steps patient completed: Thread/unthread right underwear leg;Thread/unthread left underwear leg;Pull underwear up/down;Thread/unthread right pants leg;Thread/unthread left pants leg;Pull pants up/down;Don/Doff right sock;Don/Doff left sock;Don/Doff right shoe;Don/Doff left shoe;Fasten/unfasten right shoe;Fasten/unfasten left shoe Lower body dressing/undressing: 5: Set-up assist to: Don/Doff TED stocking  FIM - Toileting Toileting steps completed by patient: Adjust clothing prior to toileting;Performs perineal hygiene;Adjust clothing after toileting Toileting Assistive Devices: Grab bar or rail for support Toileting: 5: Supervision: Safety issues/verbal cues  FIM - Diplomatic Services operational officer Devices: Best boy Transfers: 4-To toilet/BSC: Min A (steadying Pt. > 75%);4-From toilet/BSC: Min A (steadying Pt. > 75%)  FIM - Bed/Chair Transfer Bed/Chair Transfer Assistive Devices: Therapist, occupational: 4: Bed > Chair or W/C: Min A (steadying Pt. > 75%);4: Chair or W/C > Bed: Min A (steadying Pt. > 75%)  FIM - Locomotion: Wheelchair Distance: 50 Locomotion: Wheelchair: 1: Total Assistance/staff pushes wheelchair (Pt<25%) FIM - Locomotion: Ambulation Locomotion: Ambulation Assistive Devices: Designer, industrial/product Ambulation/Gait Assistance: 5: Supervision Locomotion: Ambulation: 5: Travels 150 ft or more with supervision/safety issues  Comprehension Comprehension Mode: Auditory Comprehension: 5-Follows basic conversation/direction: With extra time/assistive device  Expression Expression Mode: Verbal Expression: 5-Expresses  basic needs/ideas: With extra time/assistive device  Social Interaction Social Interaction: 5-Interacts appropriately 90% of the time - Needs monitoring or encouragement for participation or interaction.  Problem Solving Problem Solving: 5-Solves basic 90% of the time/requires cueing < 10% of the time  Memory Memory: 6-More than reasonable amt of time  Medical Problem List and Plan:  1. DVT Prophylaxis/Anticoagulation: Pharmaceutical: Lovenox  2. Pain Management: Will use tylenol prn for pain. Added voltaren gel for left wrist pain. --controlled at present 3. Mood: Flat affect with underlying anxiety noted. Will continue celexa and klonopin. Needs ego support. Will have LCSW follow up for evaluation.  4. Neuropsych: This patient is capable of making decisions on his/her own behalf.  5. DM type 2 with neuropathy: Monitor BS with AC/HS checks. Off metformin. Under control  -follow for increased pain---minor at present. 6. HTN: Will monitor with bid checks. on low dose ace for better control will close monitoring for hypotension. .  7. Dyslipidemia: LDL--128.   Lipitor  . 8.  Left foot drop improving, prob secondary to CVA, also has DM with neuropathy,no further need for AFO  9.  Right arm pain may be neurogenic, no trauma, exam unremarkable, monitor    LOS (Days) 8 A FACE TO FACE EVALUATION WAS PERFORMED  [name],ANDREW E 09/30/2012, 10:13 AM

## 2012-10-01 ENCOUNTER — Inpatient Hospital Stay (HOSPITAL_COMMUNITY): Payer: Medicare Other | Admitting: *Deleted

## 2012-10-01 ENCOUNTER — Inpatient Hospital Stay (HOSPITAL_COMMUNITY): Payer: Medicare Other | Admitting: Occupational Therapy

## 2012-10-01 ENCOUNTER — Inpatient Hospital Stay (HOSPITAL_COMMUNITY): Payer: Medicare Other

## 2012-10-01 ENCOUNTER — Inpatient Hospital Stay (HOSPITAL_COMMUNITY): Payer: Medicare Other | Admitting: Physical Therapy

## 2012-10-01 DIAGNOSIS — G8110 Spastic hemiplegia affecting unspecified side: Secondary | ICD-10-CM

## 2012-10-01 DIAGNOSIS — I6330 Cerebral infarction due to thrombosis of unspecified cerebral artery: Secondary | ICD-10-CM

## 2012-10-01 LAB — GLUCOSE, CAPILLARY: Glucose-Capillary: 109 mg/dL — ABNORMAL HIGH (ref 70–99)

## 2012-10-01 NOTE — Progress Notes (Signed)
Social Work Patient ID: Sophia Rodriguez, female   DOB: November 11, 1950, 62 y.o.   MRN: [id number] Met with pt to see how going in therapies.  She feels since she has the braces her walking is better and she is more confident. Her plan is to return home at discharge, 7/15.  Will set up home health services, pt prefers and work on making her more confident about returning home. Has all her DME.  Pt smiling and more upbeat regarding progress and discharge.  Want her to feel prepared and confident about going home.

## 2012-10-01 NOTE — Progress Notes (Signed)
Reviewed and in agreement with treatment provided.  

## 2012-10-01 NOTE — Progress Notes (Signed)
Recreational Therapy Session Note  Patient Details  Name: Sophia Rodriguez MRN: [id number] Date of Birth: April 22, 1950 Today's Date: 10/01/2012 Time:  1005-1040 Pain: no c/o Skilled Therapeutic Interventions/Progress Updates: Pt tearful during session stating that she wasn't motivated to do previous leisure activities because of "limited concentration & comfort" in participating in them.  Session focused on leisure education, ways to remain active post discharge, identifying barriers to leisure participation and problem solving solutions with mod cues.  Also discussed outing and cooking activity to be scheduled for Monday, pt agreeable.  Therapy/Group: Individual Therapy   Mikayla Chiusano 10/01/2012, 1:20 PM

## 2012-10-01 NOTE — Progress Notes (Signed)
Occupational Therapy Session Notes  Patient Details  Name: Sophia Rodriguez MRN: [id number] Date of Birth: November 13, 1950  Today's Date: 10/01/2012 Time: 0800-0857 and 230-315 Time Calculation (min): 57 min and 45 min  Short Term Goals: Week 2:  OT Short Term Goal 1 (Week 2): STGs=LTGs  Skilled Therapeutic Interventions/Progress Updates:  1)  Self care retraining to include shower in therapy bathroom with tub/shower combo and curtain, groom and dress.  Focused session on completing BADLs in environment similar to home setting, tub bench transfers, BUE coordination to propel w/c.  Patient required vcs to safely shift her hips/body to get out of shower stating that she felt stuck while seated on tub bench with both feet outside the tub and right hip not fully on the bench.  Reviewed optional techniques to avoid this from happening and will practice this in the pm session.  Patient got easily turned around in w/c when trying to self propel and required assist the get going again 3/5 times!    2)  Patient napping in bed upon arrival.  Donn shoes EOB, ambulate to therapy bathroom with RW with supervision and practiced tub bench transfers until patient felt comfortable and no vcs for technique needed.  Ambulated to gym to address UE AROM and strengthening and core strengthening.  Ambulated back to her hospital room and obtained a recliner for patient's room for comfort.  All items within reach.  Therapy Documentation Precautions:  Precautions Precautions: Fall Precaution Comments: pt went home from CIR and does not note falls but did note feeling uneasy at home Required Braces or Orthoses: Other Brace/Splint Other Brace/Splint: right AFO Restrictions Weight Bearing Restrictions: No Pain: 1) Denies pain 2)  Denies pain except with end ranges of left shoulder flexion, changed position, rest and exercises. ADL: See FIM for current functional status  Therapy/Group: Individual Therapy for both  sessions  Dameon Soltis 10/01/2012, 9:22 AM

## 2012-10-01 NOTE — Progress Notes (Signed)
Physical Therapy Session Note  Patient Details  Name: Sophia Rodriguez MRN: [id number] Date of Birth: 1951-03-02  Today's Date: 10/01/2012 Time: 1103-1200 Time Calculation (min): 57 min  Short Term Goals: Week 2:   =LTGs  Skilled Therapeutic Interventions/Progress Updates:   Pt in w/c upon PT arrival to room today.  Pt was wheeled to rehab gym and perform sit to stand from w/c to Nustep at supervision level.  Performed seated Nustep x 8 mins at level 5 resistance for increased strength, LE reciprocal movement and endurance.  Next performed ambulation with stepping over targets for visual cuing to increase step length on RLE.  Also requires manual assist for weight shifting left to unweight RLE for forward progression.  Note increased difficulty with task as PT had her perform without brace today to better simulate pts activities in am prior to brace donning.  Performed x 4 reps down and back.  Following stepping over targets had pt ambulate in hallway without targets to assess carryover.  Pt did very well as long as momentum was maintained, however when turning, pt continues to have increased difficulty advancing RLE.  She was able to ambulate 35' using R handrail for support and min assist.  She was able to ambulate the 35' back without UE support but with min assist for shifting assist.  Performed stepping/turning sideways to targets for improved weight shifting, step clearance, and trunk rotation.  Did require min assist for stepping with RLE, esp when bringing RLE back to starting position, as she would tend to drag foot back.  Performed x 10 reps for 2 sets then performed stepping at a diagonal to promote improved weight shifting to right and balance. Performed to L and R x 10 reps.  Had pt perform limits of stability and also weight shifting on [employer] balance machine x 6 mins, starting with 2 hand support progressing to no UE support, but with very min assist to achieve target.  Had pt self propel  w/c approx 20' with LEs for improved strength and RLE placement.  Requires some manual assist to facilitate proper technique with BLEs.   Therapy Documentation Precautions:  Precautions Precautions: Fall Precaution Comments: pt went home from CIR and does not note falls but did note feeling uneasy at home Required Braces or Orthoses: Other Brace/Splint Other Brace/Splint: right AFO Restrictions Weight Bearing Restrictions: No   Locomotion : Ambulation Ambulation/Gait Assistance: 4: Min assist   See FIM for current functional status  Therapy/Group: Individual Therapy  Vista Deck 10/01/2012, 12:14 PM

## 2012-10-01 NOTE — Progress Notes (Signed)
Physical Therapy Session Note  Patient Details  Name: Sophia Rodriguez MRN: [id number] Date of Birth: 08-15-1950  Today's Date: 10/01/2012 Time: 0902-0928 Time Calculation (min): 26 min  Short Term Goals: Week 1:  PT Short Term Goal 1 (Week 1): = LTGs  Skilled Therapeutic Interventions/Progress Updates:    Therapy session focused on dynamic standing balance in the simulated kitchen, short distance gait, and therapeutic exercise. Pt was able to perform dynamic balance reaching activity into high and low cabinets with S while using RW. Pt performed short distance gait in kitchen that included forward and lateral stepping with S using one UE for support. Pt performed standing exercises at kitchen counter that consisted of B/L Hip ABD and Hip extension (2 sets x 10 reps). Pt required S when completing exercises on the L LE and active assist to achieve end range for hip extension. Therapeutic exercises focused on activating hip extensor musculature to improve functional mobility. Pt required two  seated rest breaks and was educated on the 4 S strategy for getting out of a freeze. No c/o pain during therapy.   Therapy Documentation Precautions:  Precautions Precautions: Fall Precaution Comments: pt went home from CIR and does not note falls but did note feeling uneasy at home Required Braces or Orthoses: Other Brace/Splint Other Brace/Splint: right AFO Restrictions Weight Bearing Restrictions: No  See FIM for current functional status  Therapy/Group: Individual Therapy  [name], [name] 10/01/2012, 9:39 AM

## 2012-10-01 NOTE — Progress Notes (Signed)
Patient ID: Sophia Rodriguez, female   DOB: [birth year], 62 y.o.   MRN: [id number] Subjective/Complaints: 62 y.o. 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 She was admitted on 08/26/12 with 24 hour history of difficulty walking, decrease in balance as well as increased right sided weakness. MRI of the brain showed acute nonhemorrhagic infarct at the junction of the right thalamus and posterior limb of the right internal capsule as well as remote moderate to large size infarct medial aspect left frontal and parietal lobes as well as left globus pallidus and remote left paracentral small infarct. MRA of the head with atherosclerotic type changes. MRA of the neck without significant stenosis. Echocardiogram with EF 60-65% and source of emboli. Carotid Dopplers with no ICA stenosis. Neurology recommended changing patient to aggrenox for thrombotic stroke due to SVD Extension of R thalamic and PLIC infarct 7/1 No issues overnite  Review of Systems  Neurological: Positive for focal weakness.  All other systems reviewed and are negative.    Objective: Vital Signs: Blood pressure 110/72, pulse 86, temperature 97.7 F (36.5 C), temperature source Oral, resp. rate 18, height 5\' 6"  (1.676 m), weight 75.3 kg (166 lb 0.1 oz), SpO2 99.00%. No results found. Results for orders placed during the hospital encounter of 09/22/12 (from the past 72 hour(s))  GLUCOSE, CAPILLARY     Status: None   Collection Time    09/28/12 11:26 AM      Result Value Range   Glucose-Capillary 92  70 - 99 mg/dL   Comment 1 Notify RN    GLUCOSE, CAPILLARY     Status: Abnormal   Collection Time    09/28/12  4:40 PM      Result Value Range   Glucose-Capillary 104 (*) 70 - 99 mg/dL   Comment 1 Notify RN    GLUCOSE, CAPILLARY     Status: Abnormal   Collection Time    09/28/12  8:57 PM      Result  Value Range   Glucose-Capillary 124 (*) 70 - 99 mg/dL   Comment 1 Notify RN    CREATININE, SERUM     Status: Abnormal   Collection Time    09/29/12  5:20 AM      Result Value Range   Creatinine, Ser 0.96  0.50 - 1.10 mg/dL   GFR calc non Af Amer 63 (*) >90 mL/min   GFR calc Af Amer 73 (*) >90 mL/min   Comment:            The eGFR has been calculated     using the CKD EPI equation.     This calculation has not been     validated in all clinical     situations.     eGFR's persistently     <90 mL/min signify     possible Chronic Kidney Disease.  GLUCOSE, CAPILLARY     Status: None   Collection Time    09/29/12  7:32 AM      Result Value Range   Glucose-Capillary 92  70 - 99 mg/dL   Comment 1 Notify RN    GLUCOSE, CAPILLARY     Status: None   Collection Time    09/29/12 11:58 AM      Result Value Range   Glucose-Capillary 83  70 - 99 mg/dL   Comment 1 Notify RN    GLUCOSE, CAPILLARY  Status: None   Collection Time    09/29/12  5:08 PM      Result Value Range   Glucose-Capillary 89  70 - 99 mg/dL   Comment 1 Notify RN    GLUCOSE, CAPILLARY     Status: Abnormal   Collection Time    09/29/12  8:54 PM      Result Value Range   Glucose-Capillary 101 (*) 70 - 99 mg/dL   Comment 1 Notify RN    GLUCOSE, CAPILLARY     Status: Abnormal   Collection Time    09/30/12  7:54 AM      Result Value Range   Glucose-Capillary 124 (*) 70 - 99 mg/dL   Comment 1 Notify RN    GLUCOSE, CAPILLARY     Status: Abnormal   Collection Time    09/30/12 12:02 PM      Result Value Range   Glucose-Capillary 101 (*) 70 - 99 mg/dL   Comment 1 Notify RN    GLUCOSE, CAPILLARY     Status: None   Collection Time    09/30/12  4:58 PM      Result Value Range   Glucose-Capillary 93  70 - 99 mg/dL   Comment 1 Notify RN    GLUCOSE, CAPILLARY     Status: Abnormal   Collection Time    09/30/12  9:14 PM      Result Value Range   Glucose-Capillary 155 (*) 70 - 99 mg/dL  GLUCOSE, CAPILLARY     Status:  Abnormal   Collection Time    10/01/12  7:20 AM      Result Value Range   Glucose-Capillary 109 (*) 70 - 99 mg/dL   Comment 1 Notify RN        Nursing note and vitals reviewed.  Constitutional: She is oriented to person, place, and time. She appears well-developed and well-nourished.  HENT:  Head: Normocephalic and atraumatic.  Eyes: Pupils are equal, round, and reactive to light.  Neck: Normal range of motion. Neck supple.  Cardiovascular: Normal rate and regular rhythm.  Pulmonary/Chest: Effort normal and breath sounds normal. No respiratory distress. She has no wheezes.  Abdominal: Soft. Bowel sounds are normal.  Musculoskeletal: She exhibits no edema and no tenderness. Normal appearing arches, feet--non tender with ROM/palpation, R arm without swelling or tenderness to palpation.    Neurological: She is alert and oriented to person, place, and time.  Soft spoken. Speech clear. Some delay in initiation but able to follow commands without difficulty. LUE weakness with decrease in fine motor control. RLE with minimal weakness.  Skin: Skin is warm and dry.  Psychiatric: Her speech is normal and behavior is normal. Thought content normal. Her mood appears a little flat. Cognition and memory are normal.  Motor strength: 4/5 in the right deltoid, biceps, triceps, grip, hip flexor, knee extensors, 3-/5 ankle dorsiflexor plantar flexor  4 minus/5 in the left deltoid, biceps, triceps, grip, hip flexor, knee extensors, 4-/5 ankle dorsiflexor and plantar flexor  Decreased sensory Left great toe   Assessment/Plan: 1. 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   FIM - Upper Body Dressing/Undressing Upper body dressing/undressing steps patient completed: Thread/unthread right bra  strap;Thread/unthread left bra strap;Hook/unhook bra;Thread/unthread right sleeve of pullover shirt/dresss;Thread/unthread left sleeve of pullover shirt/dress;Put head through opening of  pull over shirt/dress;Pull shirt over trunk Upper body dressing/undressing: 5: Set-up assist to: Obtain clothing/put away FIM - Lower Body Dressing/Undressing Lower body dressing/undressing steps patient completed: Thread/unthread right underwear leg;Thread/unthread left underwear leg;Pull underwear up/down;Thread/unthread right pants leg;Thread/unthread left pants leg;Pull pants up/down;Don/Doff right sock;Don/Doff left sock;Don/Doff right shoe;Don/Doff left shoe;Fasten/unfasten right shoe;Fasten/unfasten left shoe Lower body dressing/undressing: 5: Set-up assist to: Don/Doff TED stocking  FIM - Toileting Toileting steps completed by patient: Adjust clothing prior to toileting;Performs perineal hygiene;Adjust clothing after toileting Toileting Assistive Devices: Grab bar or rail for support Toileting: 5: Supervision: Safety issues/verbal cues  FIM - Diplomatic Services operational officer Devices: Art gallery manager Transfers: 4-From toilet/BSC: Min A (steadying Pt. > 75%);5-To toilet/BSC: Supervision (verbal cues/safety issues)  FIM - Banker Devices: Therapist, occupational: 5: Bed > Chair or W/C: Supervision (verbal cues/safety issues);5: Chair or W/C > Bed: Supervision (verbal cues/safety issues)  FIM - Locomotion: Wheelchair Distance: 50 Locomotion: Wheelchair: 1: Total Assistance/staff pushes wheelchair (Pt<25%) FIM - Locomotion: Ambulation Locomotion: Ambulation Assistive Devices: Designer, industrial/product Ambulation/Gait Assistance: 5: Supervision Locomotion: Ambulation: 5: Travels 150 ft or more with supervision/safety issues  Comprehension Comprehension Mode: Auditory Comprehension: 5-Follows basic conversation/direction: With no assist  Expression Expression  Mode: Verbal Expression: 5-Expresses basic needs/ideas: With no assist  Social Interaction Social Interaction: 6-Interacts appropriately with others with medication or extra time (anti-anxiety, antidepressant).  Problem Solving Problem Solving: 5-Solves basic problems: With no assist  Memory Memory: 6-More than reasonable amt of time  Medical Problem List and Plan:  1. DVT Prophylaxis/Anticoagulation: Pharmaceutical: Lovenox  2. Pain Management: Will use tylenol prn for pain. Added voltaren gel for left wrist pain. --controlled at present 3. Mood: Flat affect with underlying anxiety noted. Will continue celexa and klonopin. Needs ego support. Will have LCSW follow up for evaluation.  4. Neuropsych: This patient is capable of making decisions on his/her own behalf.  5. DM type 2 with neuropathy: Monitor BS with AC/HS checks. Off metformin. Under control  -follow for increased pain---minor at present. 6. HTN: Will monitor with bid checks. on low dose ace for better control will close monitoring for hypotension. .  7. Dyslipidemia: LDL--128.   Lipitor  . 8.  Left foot drop improving, prob secondary to CVA, also has DM with neuropathy,no further need for AFO  9.  Right arm pain improved may be neurogenic, no trauma, exam unremarkable, monitor    LOS (Days) 9 A FACE TO FACE EVALUATION WAS PERFORMED  Sophia Rodriguez E 10/01/2012, 9:27 AM

## 2012-10-02 ENCOUNTER — Inpatient Hospital Stay (HOSPITAL_COMMUNITY): Payer: Medicare Other

## 2012-10-02 DIAGNOSIS — I6350 Cerebral infarction due to unspecified occlusion or stenosis of unspecified cerebral artery: Secondary | ICD-10-CM

## 2012-10-02 LAB — GLUCOSE, CAPILLARY
Glucose-Capillary: 99 mg/dL (ref 70–99)
Glucose-Capillary: 103 mg/dL — ABNORMAL HIGH (ref 70–99)

## 2012-10-02 NOTE — Progress Notes (Addendum)
Physical Therapy Session Note  Patient Details  Name: Sophia Rodriguez MRN: [id number] Date of Birth: 10/25/1950  Today's Date: 10/02/2012 Time:0935-1018  -   43 min  Short Term Goals: Week 1:  PT Short Term Goal 1 (Week 1): = LTGs Some LTGs have been upgraded to modified independent, as pt feels more confident and demonstrates appropriate self monitoring.  In the clinic/controlled env, the LTG continues to be supervision for ambulation x 150' and managing stairs, due to the distractions in the busy, less familiar setting. LTGs for basic transfers and gait x 50' in home environment were upgraded to modified independent.     Skilled Therapeutic Interventions/Progress Updates:  Treatment focused on simulated car transfer, gait, stairs, self-ROM R heel cord and hamstring, neuromuscular re-education for balance, Fall Prevention 1 exs.  Therapist educated pt on self-stretching R heel cord and hamstring before gait.  Pt continues to ambulate without RAFO, stating that her leg feels easier to move.  Gait with RW x 150' with supervision, occasional VCs for increased velocity and neutral hip rotation  RLE.  Up/down 4 steps with 2 rails with supervision.  Simulated car transfer with supervision, VCs for hand placement.  neuromuscular re-education for gait without RW, working on R foot clearance, neutral hip rotation, longer strides via VCs, close supervision.  Pt self-monitored for shoes squeeking, indicating shoes were rubbing floor, and paused for brief standing rest breaks.    neuromuscular re-education for balance retraining, standing on compliant Airex mat, during L<>R wt shifting, toes up/toes down, mini squats.  R ankle DF inadequate for ankle strategy during LOB posteriorly, even without RAFO.    Therapy Documentation Precautions:  Precautions Precautions: Fall Precaution Comments: pt went home from CIR and does not note falls but did note feeling uneasy at home Required Braces or  Orthoses: Other Brace/Splint Other Brace/Splint: right AFO Restrictions Weight Bearing Restrictions: No   Pain: Pain Assessment Pain Assessment: No/denies pain Pain Score: Asleep   Other Treatments: Treatments Neuromuscular Facilitation: Right;Left;Lower Extremity;Forced use;Activity to increase coordination;Activity to increase motor control;Activity to increase timing and sequencing;Activity to increase sustained activation;Activity to increase grading;Activity to increase lateral weight shifting;Activity to increase anterior-posterior weight shifting  See FIM for current functional status  Therapy/Group: Individual Therapy  Frans Valente 10/02/2012, 10:02 AM

## 2012-10-02 NOTE — Progress Notes (Signed)
Sharnette Kitamura is a 62 y.o. female [birth year] [id number]  Subjective: No new complaints. No new problems. Slept well. Feeling OK.  Objective: Vital signs in last 24 hours: Temp:  [98.1 F (36.7 C)-98.6 F (37 C)] 98.6 F (37 C) (07/12 0600) Pulse Rate:  [75-85] 85 (07/12 0600) Resp:  [18] 18 (07/12 0600) BP: (115-155)/(79-94) 155/94 mmHg (07/12 0600) SpO2:  [100 %] 100 % (07/12 0600) Weight change:  Last BM Date: 09/30/12  Intake/Output from previous day: 07/11 0701 - 07/12 0700 In: 720 [P.O.:720] Out: 1 [Urine:1] Last cbgs: CBG (last 3)   Recent Labs  10/01/12 1651 10/01/12 2118 10/02/12 0713  GLUCAP 110* 121* 99     Physical Exam General: No apparent distress    HEENT: moist mucosa Lungs: Normal effort. Lungs clear to auscultation, no crackles or wheezes. Cardiovascular: Regular rate and rhythm, no edema Musculoskeletal:  No change from before Neurological: No new neurological deficits Wounds: N/A    Skin: clear Alert, cooperative   Lab Results: BMET    Component Value Date/Time   NA 140 09/23/2012 0600   K 3.8 09/23/2012 0600   CL 107 09/23/2012 0600   CO2 23 09/23/2012 0600   GLUCOSE 96 09/23/2012 0600   BUN 18 09/23/2012 0600   CREATININE 0.96 09/29/2012 0520   CALCIUM 9.4 09/23/2012 0600   GFRNONAA 63* 09/29/2012 0520   GFRAA 73* 09/29/2012 0520   CBC    Component Value Date/Time   WBC 6.8 09/23/2012 0600   RBC 4.34 09/23/2012 0600   HGB 11.3* 09/23/2012 0600   HCT 34.4* 09/23/2012 0600   PLT 273 09/23/2012 0600   MCV 79.3 09/23/2012 0600   MCH 26.0 09/23/2012 0600   MCHC 32.8 09/23/2012 0600   RDW 14.5 09/23/2012 0600   LYMPHSABS 2.1 09/23/2012 0600   MONOABS 0.5 09/23/2012 0600   EOSABS 0.1 09/23/2012 0600   BASOSABS 0.0 09/23/2012 0600    Studies/Results: No results found.  Medications: I have reviewed the patient's current medications.  Assessment/Plan:   1. DVT Prophylaxis/Anticoagulation: Pharmaceutical: Lovenox  2. Pain Management: Will use tylenol prn for pain.  Added voltaren gel for left wrist pain. --controlled at present  3. Mood: Flat affect with underlying anxiety noted. Will continue celexa and klonopin. Needs ego support. Will have LCSW follow up for evaluation.  4. Neuropsych: This patient is capable of making decisions on his/her own behalf.  5. DM type 2 with neuropathy: Monitor BS with AC/HS checks. Off metformin. Under control  -follow for increased pain---minor at present.  6. HTN: Will monitor with bid checks. on low dose ace for better control will close monitoring for hypotension. .  7. Dyslipidemia: LDL--128. Lipitor .  8. Left foot drop improving, prob secondary to CVA, also has DM with neuropathy,no further need for AFO  9. Right arm pain improved may be neurogenic, no trauma, exam unremarkable, monitor  Cont Rx    Length of stay, days: 10  Sonda Primes , MD 10/02/2012, 8:57 AM

## 2012-10-03 ENCOUNTER — Inpatient Hospital Stay (HOSPITAL_COMMUNITY): Payer: Medicare Other

## 2012-10-03 LAB — GLUCOSE, CAPILLARY
Glucose-Capillary: 101 mg/dL — ABNORMAL HIGH (ref 70–99)
Glucose-Capillary: 126 mg/dL — ABNORMAL HIGH (ref 70–99)
Glucose-Capillary: 97 mg/dL (ref 70–99)

## 2012-10-03 NOTE — Progress Notes (Signed)
Sophia Rodriguez is a 62 y.o. female 04/20/1950 [id number]  Subjective: No new complaints. No new problems. Slept well. Feeling OK.  Objective: Vital signs in last 24 hours: Temp:  [98.1 F (36.7 C)-98.3 F (36.8 C)] 98.3 F (36.8 C) (07/13 0430) Pulse Rate:  [76-87] 76 (07/13 0430) Resp:  [18] 18 (07/13 0430) BP: (111-114)/(71) 114/71 mmHg (07/13 0430) SpO2:  [98 %-100 %] 98 % (07/13 0430) Weight change:  Last BM Date: 09/30/12  Intake/Output from previous day: 07/12 0701 - 07/13 0700 In: 840 [P.O.:840] Out: -  Last cbgs: CBG (last 3)   Recent Labs  10/02/12 1120 10/02/12 1625 10/02/12 2056  GLUCAP 103* 87 111*     Physical Exam General: No apparent distress    HEENT: moist mucosa Lungs: Normal effort. Lungs clear to auscultation, no crackles or wheezes. Cardiovascular: Regular rate and rhythm, no edema Musculoskeletal:  No change from before Neurological: No new neurological deficits Wounds: N/A    Skin: clear Alert, cooperative   Lab Results: BMET    Component Value Date/Time   NA 140 09/23/2012 0600   K 3.8 09/23/2012 0600   CL 107 09/23/2012 0600   CO2 23 09/23/2012 0600   GLUCOSE 96 09/23/2012 0600   BUN 18 09/23/2012 0600   CREATININE 0.96 09/29/2012 0520   CALCIUM 9.4 09/23/2012 0600   GFRNONAA 63* 09/29/2012 0520   GFRAA 73* 09/29/2012 0520   CBC    Component Value Date/Time   WBC 6.8 09/23/2012 0600   RBC 4.34 09/23/2012 0600   HGB 11.3* 09/23/2012 0600   HCT 34.4* 09/23/2012 0600   PLT 273 09/23/2012 0600   MCV 79.3 09/23/2012 0600   MCH 26.0 09/23/2012 0600   MCHC 32.8 09/23/2012 0600   RDW 14.5 09/23/2012 0600   LYMPHSABS 2.1 09/23/2012 0600   MONOABS 0.5 09/23/2012 0600   EOSABS 0.1 09/23/2012 0600   BASOSABS 0.0 09/23/2012 0600    Studies/Results: No results found.  Medications: I have reviewed the patient's current medications.  Assessment/Plan:   1. DVT Prophylaxis/Anticoagulation: Pharmaceutical: Lovenox  2. Pain Management: Will use tylenol prn for pain.  Added voltaren gel for left wrist pain. --controlled at present  3. Mood: Flat affect with underlying anxiety noted. Will continue celexa and klonopin. Needs ego support. Will have LCSW follow up for evaluation.  4. Neuropsych: This patient is capable of making decisions on his/her own behalf.  5. DM type 2 with neuropathy: Monitor BS with AC/HS checks. Off metformin. Under control  -follow for increased pain---minor at present.  6. HTN: Will monitor with bid checks. on low dose ace for better control will close monitoring for hypotension. .  7. Dyslipidemia: LDL--128. Lipitor .  8. Left foot drop improving, prob secondary to CVA, also has DM with neuropathy,no further need for AFO  9. Right arm pain improved may be neurogenic, no trauma, exam unremarkable, monitor  Cont Rx    Length of stay, days: 11  Sonda Primes , MD 10/03/2012, 8:33 AM

## 2012-10-03 NOTE — Progress Notes (Signed)
Occupational Therapy Session Note  Patient Details  Name: Sophia Rodriguez MRN: [id number] Date of Birth: [birth year]  Today's Date: 10/03/2012 Time: 9528-4132 Time Calculation (min): 44 min  Short Term Goals: Week 2:  OT Short Term Goal 1 (Week 2): STGs=LTGs  Skilled Therapeutic Interventions: ADL-retraining with emphasis on dynamic sitting/standing balance, GM/FM control of bil UE, functional mobility using RW, and general strengthening/endurance.   Patient demonstrated effective and efficient use of bil UE during all ADL this session to include transfers, retrieving clothing, bathing, dressing and ironing of her blouse while standing with OT providing supervision for safety.   Patient requires verbal cues to attend to R-LE while ambulating to avoid dragging her foot as she walks with RW.  Cues include, bending her knee, lifting her leg to place her foot forward, and pointing her toes forward.   Patient demos good thoroughness and was able to button on buttons without assist.   Therapy Documentation Precautions:  Precautions Precautions: Fall Precaution Comments: pt went home from CIR and does not note falls but did note feeling uneasy at home Required Braces or Orthoses: Other Brace/Splint Other Brace/Splint: right AFO Restrictions Weight Bearing Restrictions: No  Pain: Pain Assessment Pain Assessment: No/denies pain Pain Score: 0-No pain  See FIM for current functional status  Therapy/Group: Individual Therapy  Georgeanne Nim 10/03/2012, 10:14 AM

## 2012-10-03 NOTE — Progress Notes (Signed)
Refused sorbitol. Last bm 7/10. She wants to wait till tonight if she can have bowel movement. Will monitor.

## 2012-10-04 ENCOUNTER — Inpatient Hospital Stay (HOSPITAL_COMMUNITY): Payer: Medicare Other | Admitting: *Deleted

## 2012-10-04 ENCOUNTER — Inpatient Hospital Stay (HOSPITAL_COMMUNITY): Payer: Medicare Other

## 2012-10-04 ENCOUNTER — Inpatient Hospital Stay (HOSPITAL_COMMUNITY): Payer: Medicare Other | Admitting: Occupational Therapy

## 2012-10-04 DIAGNOSIS — I6330 Cerebral infarction due to thrombosis of unspecified cerebral artery: Secondary | ICD-10-CM

## 2012-10-04 DIAGNOSIS — G8110 Spastic hemiplegia affecting unspecified side: Secondary | ICD-10-CM

## 2012-10-04 LAB — GLUCOSE, CAPILLARY: Glucose-Capillary: 119 mg/dL — ABNORMAL HIGH (ref 70–99)

## 2012-10-04 NOTE — Progress Notes (Addendum)
Physical Therapy Discharge Summary  Patient Details  Name: Sophia Rodriguez MRN: [id number] Date of Birth: 25-Nov-1950  Today's Date: 10/04/2012 Time: 6578-4696 Time Calculation (min): 30 min  Patient has met 6 of 6 long term goals due to improved activity tolerance, improved balance, improved postural control, increased strength, functional use of  right lower extremity and left lower extremity and improved awareness.  Patient to discharge at an ambulatory level Modified Independent.     Reasons goals not met: n/a  Recommendation:  Patient will benefit from ongoing skilled PT services in home health setting to continue to advance safe functional mobility, address ongoing impairments in balance, motor control bil LEs, strength, activity tolerance, and minimize fall risk.  Equipment: No equipment provided ; pt owns RW and adapted shoes  Reasons for discharge: treatment goals met and discharge from hospital  Patient/family agrees with progress made and goals achieved: Yes  PT Discharge Precautions/Restrictions Precautions Precautions: Fall   Pain Pain Assessment Pain Assessment: No/denies pain Vision/Perception- no change from baseline     Cognition- A and O x 4; delayed speech at times   Sensation Sensation Light Touch: Appears Intact Proprioception: Appears Intact (L ankle) Coordination Heel Shin Test: typical speed and excursion RLE, improved since admission; decreased speed and excursion LLE Motor  Motor Motor:  (hemiparesis bil) Motor - Discharge Observations: improved isolated movement LLE  Mobility Bed Mobility Bed Mobility:  (mod I for all) Transfers Sit to Stand: 6: Modified independent (Device/Increase time) Stand Pivot Transfers: 6: Modified independent (Device/Increase time) Locomotion  Ambulation Ambulation: Yes Ambulation/Gait Assistance: 6: Modified independent (Device/Increase time) Ambulation Distance (Feet): 200 Feet Assistive device: Rolling walker  (modified shoes) Gait Gait: Yes Gait Pattern: Impaired Gait Pattern: Step-through pattern;Decreased step length - right;Decreased hip/knee flexion - right;Decreased hip/knee flexion - left;Shuffle;Decreased dorsiflexion - right Gait velocity: 2.73'/sec  (1 cue to walk faster) Stairs / Additional Locomotion Stairs: Yes Stairs Assistance: 6: Modified independent (Device/Increase time) Stair Management Technique: Two rails Number of Stairs: 5 Height of Stairs: 7 Ramp: 5: Supervision Curb: 5: Supervision Wheelchair Mobility Wheelchair Mobility: No  Trunk/Postural Assessment  Cervical Assessment Cervical Assessment: Within Functional Limits Thoracic Assessment Thoracic Assessment: Within Functional Limits Lumbar Assessment Lumbar Assessment: Within Functional Limits Postural Control Postural Control: Deficits on evaluation Protective Responses: absent R ankle strategy, delayed and inadequate bil hip strategies,absent stepping strategy Postural Limitations: decreased wt bearing RLE in standing  Balance Balance Balance Assessed: Yes Static Standing Balance Static Standing - Level of Assistance: 6: Modified independent (Device/Increase time) Dynamic Standing Balance Dynamic Standing - Level of Assistance: 6: Modified independent (Device/Increase time) Patient demonstrates increased fall risk as noted by score of 29 /56 on Berg Balance Scale, on 09/30/12.  Score indicates high risk for falls.  (<36= high risk for falls, close to 100%; 37-45 significant >80%; 46-51 moderate >50%; 52-55 lower >25%) Extremity Assessment      RLE Assessment RLE Assessment: Exceptions to [facility] RLE Strength RLE Overall Strength Comments: grossly 4/5 hip; knee 4+/5, ankle DF 4-/5 with inversion LLE Assessment LLE Assessment: Exceptions to [facility] LLE Strength LLE Overall Strength: Deficits LLE Overall Strength Comments: grossly 4+/5 hip, knee and ankle DF  See FIM for current functional  status  Caleigha Zale 10/04/2012, 4:10 PM

## 2012-10-04 NOTE — Progress Notes (Signed)
Social Work Patient ID: Sophia Rodriguez, female   DOB: November 25, 1950, 62 y.o.   MRN: [id number] Met with pt to discuss preference regarding home health agencies.  [facility] since she has had [facility] before. Will make referral to [facility] for follow up.  She has all DME.  She feels ready for discharge this time.  Will let worker know if will be Here later tomorrow so therapy can be scheduled.

## 2012-10-04 NOTE — Progress Notes (Addendum)
Recreational Therapy Session Note  Patient Details  Name: Ladaisha Portillo MRN: [id number] Date of Birth: 1950-09-03 Today's Date: 10/04/2012 Time:  1030-12; 1535-1725 Pain: no c/o; no c/o Skilled Therapeutic Interventions/Progress Updates: Session 1:  Pt participated in community reintegration/outing to grocery store ambulatory level with supervision.  Pt able to retrieve items from various heights of shelves, identify & negotiate obstacles while pushing grocery cart with supervision.  Pt ambulated on even/uneven community surfaces either with RW or while pushing grocery cart throughout outing without seated rest break.  See outing goal sheet in shadow chart for details.  Session 2:   Session focused on activity tolerance, dynamic balance, kitchen safety while preparing spaghetti, bread & salad, & kitchen clean up at ambulatory level.  Pt demonstrated good safety when retrieving items from various heights of cabinets, refrigerator, & cleaning up spills on floor/countertop as well as recognizing need for seated rest breaks.    Pt did not display any unsafe behaviors and completed meal prep with Mod I requiring extra time to complete task.  Pt states her kitchen at home is small and she will not be using RW, so pt completed this meal prep/kitchen clean up task without RW. Therapy/Group: [employer] individual session   Neven Fina 10/04/2012, 3:50 PM

## 2012-10-04 NOTE — Progress Notes (Signed)
Occupational Therapy Discharge Summary And Treatment Notes  Patient Details  Name: Sophia Rodriguez MRN: [id number] Date of Birth: April 09, 1950  Today's Date: 10/04/2012 Time: 0800-0900 and 1030-1200 Time Calculation (min): 60 min and 90 min  Skilled interventions: 1) Self care retraining to include shower, dress and groom.  Focused session on demonstrating independence with BADL tasks and IADL tasks related to gather items before begin, cleanup and put away after self care, and walker safety.  Patient has reached Mod I from OT standpoint!  2)  Community Outing at a walker level (and/or grocery cart) to grocery store.  Patient overall Mod I - Supervision for this IADL task as patient will complete this task upon discharge with her daughter or with a friend.  Please refer to Outing Goal Sheet in shadow chart for details.  Patient has met 10 of 10 long term goals due to improved activity tolerance, improved balance, ability to compensate for deficits, functional use of  RIGHT upper, RIGHT lower, LEFT upper and LEFT lower extremity and improved coordination.  Patient to discharge at overall Modified Independent level.  Patient's ablt to guide a caregiver if assistance is needed.  No caregiver or family present during OT sessions.  Reasons goals not met: n/a secondary to all goals met  Recommendation:  Patient will benefit from ongoing skilled OT services in home health setting to continue to advance functional skills in the area of iADL and interests and hobbies.  Equipment: patient has all DME needed for safety in the bathroom  Reasons for discharge: treatment goals met and discharge from hospital  Patient/family agrees with progress made and goals achieved: Yes  OT Discharge Precautions/Restrictions  Precautions Precautions: Fall Pain 1)  Denies pain  2)  Denies pain ADL See FIM for current functional status Vision/Perception  Vision - History Baseline Vision: Bifocals (rarely wears  glasses) Patient Visual Report: No change from baseline Vision - Assessment Additional Comments: upon admission patient reports some decreased acuity that has now cleared per patient.  Per patient report, she only wears her glasses for driving (hasn't driven for ~3 yrs) and occasionally when watching TV.  Cognition Overall Cognitive Status: Within Functional Limits for tasks assessed Arousal/Alertness: Awake/alert Orientation Level: Oriented X4 Attention: Sustained Sustained Attention: Appears intact Selective Attention:  (easily distracted in busy environment) Memory: Appears intact Awareness: Appears intact Problem Solving: Appears intact Executive Function:  (Appears WFL) Sensation Sensation Light Touch: Appears Intact (BUEs) Hot/Cold: Appears Intact (BUEs) Proprioception: Appears Intact (BUEs) Coordination Gross Motor Movements are Fluid and Coordinated: No Fine Motor Movements are Fluid and Coordinated: No Coordination and Movement Description: decreased speed accuracy and rhythm with UE rapid alternating movements and isolated finger movements, decrease speed with FTN Motor  Motor Motor:  (hemiparesis bil) Motor - Discharge Observations: improved isolated movement LLE Mobility  Bed Mobility Bed Mobility:  (mod I for all) Transfers Sit to Stand: 6: Modified independent (Device/Increase time)  Trunk/Postural Assessment  Cervical Assessment Cervical Assessment: Within Functional Limits Thoracic Assessment Thoracic Assessment: Within Functional Limits Lumbar Assessment Lumbar Assessment: Within Functional Limits Postural Control Postural Control: Deficits on evaluation Protective Responses: absent R ankle strategy, delayed and inadequate bil hip strategies,absent stepping strategy Postural Limitations: decreased wt bearing RLE in standing  Balance Balance Balance Assessed: Yes Static Standing Balance Static Standing - Level of Assistance: 6: Modified independent  (Device/Increase time) Dynamic Standing Balance Dynamic Standing - Level of Assistance: 6: Modified independent (Device/Increase time) Extremity/Trunk Assessment RUE Assessment RUE Assessment: Exceptions to [facility] RUE AROM (degrees)  RUE Overall AROM Comments: AROM WFL except end ranges of shoulder movements due to pain. RUE Strength RUE Overall Strength Comments: Grossly 4-/5, grip strength is functional LUE Assessment LUE Assessment: Exceptions to [facility] LUE Strength LUE Overall Strength Comments: Shoulder strength unable to assess due to shoulder pain with MMT, grossly 3+/5 for shoulder and 4/5 distal to shoulder.  Patient reports premorbid pain from arthritis Gross Grasp: Functional  Soledad Budreau 10/04/2012, 4:08 PM

## 2012-10-04 NOTE — Discharge Summary (Signed)
  Discharge summary job # [id number]

## 2012-10-04 NOTE — Progress Notes (Signed)
Patient ID: Sophia Rodriguez, female   DOB: Feb 04, 1951, 62 y.o.   MRN: [id number] Subjective/Complaints:  Up at sink, washing up. No complaints Review of Systems  Neurological: Positive for focal weakness.  All other systems reviewed and are negative.    Objective: Vital Signs: Blood pressure 132/77, pulse 78, temperature 98.2 F (36.8 C), temperature source Oral, resp. rate 18, height 5\' 6"  (1.676 m), weight 75.3 kg (166 lb 0.1 oz), SpO2 98.00%. No results found. Results for orders placed during the hospital encounter of 09/22/12 (from the past 72 hour(s))  GLUCOSE, CAPILLARY     Status: None   Collection Time    10/01/12 12:01 PM      Result Value Range   Glucose-Capillary 91  70 - 99 mg/dL   Comment 1 Notify RN    GLUCOSE, CAPILLARY     Status: Abnormal   Collection Time    10/01/12  4:51 PM      Result Value Range   Glucose-Capillary 110 (*) 70 - 99 mg/dL  GLUCOSE, CAPILLARY     Status: Abnormal   Collection Time    10/01/12  9:18 PM      Result Value Range   Glucose-Capillary 121 (*) 70 - 99 mg/dL  GLUCOSE, CAPILLARY     Status: None   Collection Time    10/02/12  7:13 AM      Result Value Range   Glucose-Capillary 99  70 - 99 mg/dL   Comment 1 Notify RN    GLUCOSE, CAPILLARY     Status: Abnormal   Collection Time    10/02/12 11:20 AM      Result Value Range   Glucose-Capillary 103 (*) 70 - 99 mg/dL   Comment 1 Notify RN    GLUCOSE, CAPILLARY     Status: None   Collection Time    10/02/12  4:25 PM      Result Value Range   Glucose-Capillary 87  70 - 99 mg/dL   Comment 1 Notify RN    GLUCOSE, CAPILLARY     Status: Abnormal   Collection Time    10/02/12  8:56 PM      Result Value Range   Glucose-Capillary 111 (*) 70 - 99 mg/dL   Comment 1 Notify RN    GLUCOSE, CAPILLARY     Status: Abnormal   Collection Time    10/03/12  7:06 AM      Result Value Range   Glucose-Capillary 112 (*) 70 - 99 mg/dL   Comment 1 Notify RN    GLUCOSE, CAPILLARY     Status:  Abnormal   Collection Time    10/03/12 11:23 AM      Result Value Range   Glucose-Capillary 101 (*) 70 - 99 mg/dL   Comment 1 Notify RN    GLUCOSE, CAPILLARY     Status: Abnormal   Collection Time    10/03/12  4:32 PM      Result Value Range   Glucose-Capillary 126 (*) 70 - 99 mg/dL   Comment 1 Notify RN    GLUCOSE, CAPILLARY     Status: None   Collection Time    10/03/12  8:25 PM      Result Value Range   Glucose-Capillary 97  70 - 99 mg/dL   Comment 1 Notify RN    GLUCOSE, CAPILLARY     Status: None   Collection Time    10/04/12  7:24 AM      Result Value Range  Glucose-Capillary 96  70 - 99 mg/dL      Nursing note and vitals reviewed.  Constitutional: She is oriented to person, place, and time. She appears well-developed and well-nourished.  HENT:  Head: Normocephalic and atraumatic.  Eyes: Pupils are equal, round, and reactive to light.  Neck: Normal range of motion. Neck supple.  Cardiovascular: Normal rate and regular rhythm.  Pulmonary/Chest: Effort normal and breath sounds normal. No respiratory distress. She has no wheezes.  Abdominal: Soft. Bowel sounds are normal.  Musculoskeletal: She exhibits no edema and no tenderness. Normal appearing arches, feet--non tender with ROM/palpation, R arm without swelling or tenderness to palpation.    Neurological: She is alert and oriented to person, place, and time.  Soft spoken. Speech clear. Some delay in initiation but able to follow commands without difficulty. LUE weakness with decrease in fine motor control. RLE with minimal weakness.  Skin: Skin is warm and dry.  Psychiatric: Her speech is normal and behavior is normal. Thought content normal. Her mood appears flat. Cognition and memory are normal.  Motor strength: 4/5 in the right deltoid, biceps, triceps, grip, hip flexor, knee extensors, 3-/5 ankle dorsiflexor plantar flexor  4 minus/5 in the left deltoid, biceps, triceps, grip, hip flexor, knee extensors, 4-/5 ankle  dorsiflexor and plantar flexor  Decreased sensory Left great toe   Assessment/Plan: 1. 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  Outing today before dc   FIM - Upper Body Dressing/Undressing Upper body dressing/undressing steps patient completed: Thread/unthread right bra strap;Thread/unthread left bra strap;Hook/unhook bra;Thread/unthread right sleeve of front closure shirt/dress;Thread/unthread left sleeve of front closure shirt/dress;Pull shirt around back of front closure shirt/dress;Button/unbutton shirt Upper body dressing/undressing: 5: Supervision: Safety issues/verbal cues FIM - Lower Body Dressing/Undressing Lower body dressing/undressing steps patient completed: Thread/unthread right underwear leg;Thread/unthread left underwear leg;Pull underwear up/down;Thread/unthread right pants leg;Thread/unthread left pants leg;Pull pants up/down;Fasten/unfasten pants;Don/Doff right sock;Don/Doff left sock Lower body dressing/undressing: 5: Supervision: Safety issues/verbal cues  FIM - Toileting Toileting steps completed by patient: Adjust clothing prior to toileting;Performs perineal hygiene;Adjust clothing after toileting Toileting Assistive Devices: Grab bar or rail for support Toileting: 6: More than reasonable amount of time  FIM - Diplomatic Services operational officer Devices: Grab bars;Walker Toilet Transfers: 6-Assistive device: No helper;4-To toilet/BSC: Min A (steadying Pt. > 75%);4-From toilet/BSC: Min A (steadying Pt. > 75%)  FIM - Bed/Chair Transfer Bed/Chair Transfer Assistive Devices: Bed rails;Walker Bed/Chair Transfer: 6: Supine > Sit: No assist;5: Bed > Chair or W/C: Supervision (verbal cues/safety issues)  FIM - Locomotion: Wheelchair Distance:  50 Locomotion: Wheelchair: 0: Activity did not occur FIM - Locomotion: Ambulation Locomotion: Ambulation Assistive Devices: Designer, industrial/product Ambulation/Gait Assistance: 5: Supervision Locomotion: Ambulation: 5: Travels 150 ft or more with supervision/safety issues  Comprehension Comprehension Mode: Auditory Comprehension: 6-Follows complex conversation/direction: With extra time/assistive device  Expression Expression Mode: Verbal Expression: 5-Expresses complex 90% of the time/cues < 10% of the time  Social Interaction Social Interaction: 6-Interacts appropriately with others with medication or extra time (anti-anxiety, antidepressant).  Problem Solving Problem Solving: 5-Solves complex 90% of the time/cues < 10% of the time  Memory Memory: 6-More than reasonable amt of time  Medical Problem List and Plan:  1. DVT Prophylaxis/Anticoagulation: Pharmaceutical: Lovenox  2. Pain Management: Will use tylenol prn for pain. Added voltaren gel for left wrist  pain. --controlled at present 3. Mood: Flat affect with underlying anxiety noted. Will continue celexa and klonopin.  4. Neuropsych: This patient is capable of making decisions on his/her own behalf.  5. DM type 2 with neuropathy: Monitor BS with AC/HS checks. Off metformin. Under control  -follow for increased pain---minor at present. 6. HTN: Will monitor with bid checks. on low dose ace for better control will close monitoring for hypotension. .  7. Dyslipidemia: LDL--128.   Lipitor  8.  Left foot drop improving, prob secondary to CVA, also has DM with neuropathy,no further need for AFO  9.  Right arm pain improved may be neurogenic, no trauma, exam unremarkable, monitor    LOS (Days) 12 A FACE TO FACE EVALUATION WAS PERFORMED  Izola Teague T 10/04/2012, 9:14 AM

## 2012-10-04 NOTE — Discharge Summary (Signed)
Sophia Rodriguez, Sophia Rodriguez NO.:  [id number]  MEDICAL RECORD NO.:  [id number]  LOCATION:  4M06C                        FACILITY:  [facility]  PHYSICIAN:  Sophia Rodriguez, P.A.  DATE OF BIRTH:  09-23-1950  DATE OF ADMISSION:  09/22/2012 DATE OF DISCHARGE:  10/05/2012                              DISCHARGE SUMMARY   DISCHARGE DIAGNOSES: 1. 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 2. Subcutaneous Lovenox for DVT prophylaxis. 3. Depression. 4. Hypertension. 5. Diabetes mellitus. 6. Peripheral neuropathy. 7. Hyperlipidemia.  HISTORY OF PRESENT ILLNESS:  This is a 62 year old right-handed female, history of diabetes mellitus as well as multiple CVAs with right-sided weakness, recent admission to inpatient rehab services on September 10, 2012 on Aggrenox therapy for CVA prophylaxis.  The patient was using a walker at home, living with her daughter.  Outpatient therapies had been arranged, but the patient did not have the necessary transportation. Admitted on September 22, 2012 with severe headache and increased left-sided weakness.  MRI of the brain showed acute ischemic infarct posterior limb of the right internal capsule versus extension of previous infarcts as well as stable appearance of additional remote infarcts.  The patient did not receive tPA.  Neurology service followup maintained on aspirin therapy as well as subcutaneous Lovenox for DVT prophylaxis.  Regular consistency diet.  The patient was admitted for comprehensive rehab program.  PAST MEDICAL HISTORY:  See discharge diagnoses.  SOCIAL HISTORY:  Lives alone with assist as needed.  Functional history prior to admission was using a walker.  Functional status upon admission to rehab services with moderate assist to ambulate 15 feet with a rolling walker.  PHYSICAL EXAMINATION:  VITAL SIGNS:  Blood pressure 109/68  pulse 91, temperature 97.5 respiration 18. GENERAL:  This was an alert female, oriented x3.  Pupils round and reactive to light.  She followed a three-step command. LUNGS:  Clear to auscultation. CARDIAC:  Regular rate and rhythm. ABDOMEN:  Soft, nontender.  Good bowel sounds.  REHABILITATION HOSPITAL COURSE:  The patient was admitted to inpatient rehab services with therapy and therapies initiated on a 3-hour daily basis consisting of physical therapy, occupational therapy, and 24-hour rehabilitation nursing.  The following issues were addressed during the patient's rehabilitation stay.  Retained this patient's thrombotic right thalamic and right PICA infarction remained stable, maintained on aspirin therapy.  Subcutaneous Lovenox for DVT prophylaxis.  She would follow up Neurology Services.  She did have a history of depression maintained on Celexa with emotional support provided.  Blood pressures controlled and monitored on no current antihypertensive medications. She did have history of diabetes mellitus with hemoglobin A1c of 5.9. She had been on Glucophage 250 mg twice daily prior to admission and monitored on no present diabetic agents.  Lipitor ongoing for hyperlipidemia.  The patient received weekly collaborative interdisciplinary team conferences to discuss estimated length of stay, family teaching, and any barriers to her discharge.  She was continent of bowel and bladder, supervision with functional transfers and self- care tasks needing some increased time during functional transfers secondary to motor planning, supervision overall for her functional  mobility.  Family teaching was completed with her daughter, who was to provide assistance at home.  She was discharged to home with ongoing therapies dictated through rehab services.  DISCHARGE MEDICATIONS: 1. Aspirin 325 mg p.o. daily. 2. Lipitor 40 mg p.o. daily. 3. Celexa 10 mg p.o. daily. 4. Klonopin 0.5 mg p.o. b.i.d.  as needed. 5. Multivitamin one tablet daily.  DIET:  Diabetic diet.  SPECIAL INSTRUCTIONS:  The patient would follow up Dr. Claudette Rodriguez at the outpatient rehab service office as directed, Dr. Fleet Rodriguez medical management, and Dr. Delia Rodriguez follow up neurology service in 1 month.     Sophia Rodriguez, P.A.     DA/MEDQ  D:  10/04/2012  T:  10/04/2012  Job:  [id number]  cc:   Sophia Rodriguez, M.D. Sophia P. Pearlean Brownie, MD Sophia Rodriguez, M.D.

## 2012-10-05 LAB — GLUCOSE, CAPILLARY: Glucose-Capillary: 109 mg/dL — ABNORMAL HIGH (ref 70–99)

## 2012-10-05 MED ORDER — CITALOPRAM HYDROBROMIDE 10 MG PO TABS: 10.0000 mg | ORAL_TABLET | Freq: Every day | ORAL | Status: DC

## 2012-10-05 MED ORDER — ASPIRIN 325 MG PO TABS: 325.0000 mg | ORAL_TABLET | Freq: Every day | ORAL | Status: DC

## 2012-10-05 MED ORDER — CLONAZEPAM 0.5 MG PO TABS: 0.5000 mg | ORAL_TABLET | Freq: Every day | ORAL | Status: DC | PRN

## 2012-10-05 MED ORDER — ATORVASTATIN CALCIUM 40 MG PO TABS: 40.0000 mg | ORAL_TABLET | Freq: Every day | ORAL | Status: AC

## 2012-10-05 MED ORDER — ZOLPIDEM TARTRATE 5 MG PO TABS: 5.0000 mg | ORAL_TABLET | Freq: Every evening | ORAL | Status: DC | PRN

## 2012-10-05 MED ORDER — ADULT MULTIVITAMIN W/MINERALS CH: 1.0000 | ORAL_TABLET | Freq: Every day | ORAL | Status: DC

## 2012-10-05 NOTE — Progress Notes (Signed)
Patient and family member received verbal and written discharge instructions by Deatra Ina, PA. Reviewed with patient and family again, deny questions or concerns, aware of limitations and follow up appointments, medications. Personal belongings packed by patient and staff. Patient to private vehicle by NT via wheelchair. Roberts-VonCannon, Neeya Prigmore Elon Jester

## 2012-10-05 NOTE — Progress Notes (Signed)
Patient ID: Sophia Rodriguez, female   DOB: [birth year], 62 y.o.   MRN: [id number] Subjective/Complaints:  Successful outing yesterday. Ready to go home.  Review of Systems  Neurological: Positive for focal weakness.  All other systems reviewed and are negative.    Objective: Vital Signs: Blood pressure 109/67, pulse 85, temperature 97.7 F (36.5 C), temperature source Oral, resp. rate 17, height 5\' 6"  (1.676 m), weight 75.3 kg (166 lb 0.1 oz), SpO2 96.00%. No results found. Results for orders placed during the hospital encounter of 09/22/12 (from the past 72 hour(s))  GLUCOSE, CAPILLARY     Status: Abnormal   Collection Time    10/02/12 11:20 AM      Result Value Range   Glucose-Capillary 103 (*) 70 - 99 mg/dL   Comment 1 Notify RN    GLUCOSE, CAPILLARY     Status: None   Collection Time    10/02/12  4:25 PM      Result Value Range   Glucose-Capillary 87  70 - 99 mg/dL   Comment 1 Notify RN    GLUCOSE, CAPILLARY     Status: Abnormal   Collection Time    10/02/12  8:56 PM      Result Value Range   Glucose-Capillary 111 (*) 70 - 99 mg/dL   Comment 1 Notify RN    GLUCOSE, CAPILLARY     Status: Abnormal   Collection Time    10/03/12  7:06 AM      Result Value Range   Glucose-Capillary 112 (*) 70 - 99 mg/dL   Comment 1 Notify RN    GLUCOSE, CAPILLARY     Status: Abnormal   Collection Time    10/03/12 11:23 AM      Result Value Range   Glucose-Capillary 101 (*) 70 - 99 mg/dL   Comment 1 Notify RN    GLUCOSE, CAPILLARY     Status: Abnormal   Collection Time    10/03/12  4:32 PM      Result Value Range   Glucose-Capillary 126 (*) 70 - 99 mg/dL   Comment 1 Notify RN    GLUCOSE, CAPILLARY     Status: None   Collection Time    10/03/12  8:25 PM      Result Value Range   Glucose-Capillary 97  70 - 99 mg/dL   Comment 1 Notify RN    GLUCOSE, CAPILLARY     Status: None   Collection Time    10/04/12  7:24 AM      Result Value Range   Glucose-Capillary 96  70 - 99 mg/dL   GLUCOSE, CAPILLARY     Status: None   Collection Time    10/04/12 12:11 PM      Result Value Range   Glucose-Capillary 96  70 - 99 mg/dL  GLUCOSE, CAPILLARY     Status: Abnormal   Collection Time    10/04/12  4:29 PM      Result Value Range   Glucose-Capillary 119 (*) 70 - 99 mg/dL  GLUCOSE, CAPILLARY     Status: Abnormal   Collection Time    10/04/12  9:18 PM      Result Value Range   Glucose-Capillary 140 (*) 70 - 99 mg/dL  GLUCOSE, CAPILLARY     Status: None   Collection Time    10/05/12  7:16 AM      Result Value Range   Glucose-Capillary 83  70 - 99 mg/dL      Nursing  note and vitals reviewed.  Constitutional: She is oriented to person, place, and time. She appears well-developed and well-nourished.  HENT:  Head: Normocephalic and atraumatic.  Eyes: Pupils are equal, round, and reactive to light.  Neck: Normal range of motion. Neck supple.  Cardiovascular: Normal rate and regular rhythm.  Pulmonary/Chest: Effort normal and breath sounds normal. No respiratory distress. She has no wheezes.  Abdominal: Soft. Bowel sounds are normal.  Musculoskeletal: She exhibits no edema and no tenderness. Normal appearing arches, feet--non tender with ROM/palpation, R arm without swelling or tenderness to palpation.    Neurological: She is alert and oriented to person, place, and time.  Soft spoken. Speech clear. Skin: Skin is warm and dry.  Psychiatric: Her speech is normal and behavior is normal. Thought content normal. Her mood appears flat. Cognition and memory are normal.  Motor strength: 4/5 in the right deltoid, biceps, triceps, grip, hip flexor, knee extensors, 3-/5 ankle dorsiflexor plantar flexor  4 minus/5 in the left deltoid, biceps, triceps, grip, hip flexor, knee extensors, 4-/5 ankle dorsiflexor and plantar flexor  Decreased sensory Left great toe   Assessment/Plan: 1. 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  Dc home today with daughter. Goals met.    FIM - Upper Body Dressing/Undressing Upper body dressing/undressing steps patient completed: Thread/unthread right bra strap;Thread/unthread left bra strap;Hook/unhook bra;Thread/unthread right sleeve of front closure shirt/dress;Thread/unthread left sleeve of front closure shirt/dress;Pull shirt around back of front closure shirt/dress;Button/unbutton shirt Upper body dressing/undressing: 5: Supervision: Safety issues/verbal cues FIM - Lower Body Dressing/Undressing Lower body dressing/undressing steps patient completed: Thread/unthread right underwear leg;Thread/unthread left underwear leg;Pull underwear up/down;Thread/unthread right pants leg;Thread/unthread left pants leg;Pull pants up/down;Fasten/unfasten pants;Don/Doff right sock;Don/Doff left sock Lower body dressing/undressing: 5: Supervision: Safety issues/verbal cues  FIM - Toileting Toileting steps completed by patient: Adjust clothing prior to toileting;Performs perineal hygiene;Adjust clothing after toileting Toileting Assistive Devices: Grab bar or rail for support Toileting: 6: More than reasonable amount of time  FIM - Diplomatic Services operational officer Devices: Grab bars;Walker Toilet Transfers: 6-Assistive device: No helper;4-To toilet/BSC: Min A (steadying Pt. > 75%);4-From toilet/BSC: Min A (steadying Pt. > 75%)  FIM - Bed/Chair Transfer Bed/Chair Transfer Assistive Devices: Bed rails;Walker Bed/Chair Transfer: 6: Supine > Sit: No assist;5: Bed > Chair or W/C: Supervision (verbal cues/safety issues)  FIM - Locomotion: Wheelchair Distance: 50 Locomotion: Wheelchair: 0: Activity did not occur FIM - Locomotion: Ambulation Locomotion: Ambulation Assistive Devices: Walker - Rolling (bil adapted shoes) Ambulation/Gait  Assistance: 6: Modified independent (Device/Increase time) Locomotion: Ambulation: 5: Travels 150 ft or more with supervision/safety issues  Comprehension Comprehension Mode: Auditory Comprehension: 6-Follows complex conversation/direction: With extra time/assistive device  Expression Expression Mode: Verbal Expression: 6-Expresses complex ideas: With extra time/assistive device  Social Interaction Social Interaction: 6-Interacts appropriately with others with medication or extra time (anti-anxiety, antidepressant).  Problem Solving Problem Solving: 5-Solves complex 90% of the time/cues < 10% of the time  Memory Memory: 6-More than reasonable amt of time  Medical Problem List and Plan:  1. DVT Prophylaxis/Anticoagulation: Pharmaceutical: Lovenox  2. Pain Management: Will use tylenol prn for pain. Added voltaren gel for left wrist pain. --controlled at present 3. Mood: Flat affect with underlying anxiety noted. Will continue celexa and klonopin.  4. Neuropsych: This patient is capable of making decisions on his/her own behalf.  5. DM type 2 with neuropathy: Monitor BS with AC/HS checks. Off metformin. Under control   . 6. HTN: Will monitor with bid checks. on low dose ace for better control will close monitoring for hypotension. .  7. Dyslipidemia: LDL--128.   Lipitor  8.  Left foot drop improving, prob secondary to CVA, also has DM with neuropathy,no further need for AFO  9.  Right arm pain improved, ? neurogenic, no trauma   LOS (Days) 13 A FACE TO FACE EVALUATION WAS PERFORMED  [name],ZACHARY T 10/05/2012, 8:54 AM

## 2012-10-05 NOTE — Progress Notes (Signed)
Social Work Discharge Note Discharge Note  The overall goal for the admission was met for:   Discharge location: Yes-HOME WITH INTERMITTENT ASSIST FROM DAUGHTER  Length of Stay: Yes-13 DAYS  Discharge activity level: Yes-MOD/I LEVEL  Home/community participation: Yes  Services provided included: MD, RD, PT, OT, RN, TR, Pharmacy and SW  Financial Services: Medicare and Private Insurance: BCBS  Follow-up services arranged: Home Health: [facility]-PT,OT,RN and Patient/Family request agency HH: PT PREFERS USED THEM BEFORE, DME: NO NEEDS  Comments (or additional information):PT FEELS PREPARED TO GO HOME REACHED HER GOALS OF MOD/I. WILL HAVE HOME HEALTH FOLLOW UP THIS TIME.  Patient/Family verbalized understanding of follow-up arrangements: Yes  Individual responsible for coordination of the follow-up plan: SELF & EVEL-DAUGHTER  Confirmed correct DME delivered: Lucy Chris 10/05/2012    Lucy Chris

## 2012-10-05 NOTE — Progress Notes (Signed)
Recreational Therapy Discharge Summary Patient Details  Name: Sophia Rodriguez MRN: [id number] Date of Birth: 14-May-1950 Today's Date: 10/05/2012  Long term goals set: 2  Long term goals met: 2  Comments on progress toward goals: Pt has made great progress toward goals and is ready for discharge home today at Mod I level.  Pt does require extra time and use of AE but is able to complete tasks safely without cuing or assist.  Reasons for discharge: discharge from hospital Patient/family agrees with progress made and goals achieved: Yes  Sophia Rodriguez 10/05/2012, 9:12 AM

## 2012-11-02 NOTE — Progress Notes (Signed)
Late entry for missed G-code based on chart review of evaluation by Lyanne Co, PT  10-11-12 0909  PT G-Codes **NOT FOR INPATIENT CLASS**  Functional Assessment Tool Used Clinical judgement based on chart review  Functional Limitation Mobility: Walking and moving around  Mobility: Walking and Moving Around Current Status (W0981) CK  Mobility: Walking and Moving Around Goal Status [id number]) CI  Lavona Mound, [place]  [phone] 11/02/2012

## 2012-11-08 ENCOUNTER — Encounter: Payer: Medicare Other | Attending: Physical Medicine & Rehabilitation

## 2012-11-08 ENCOUNTER — Inpatient Hospital Stay: Payer: Medicare Other | Admitting: Physical Medicine & Rehabilitation

## 2012-11-23 DIAGNOSIS — S72009A Fracture of unspecified part of neck of unspecified femur, initial encounter for closed fracture: Principal | ICD-10-CM | POA: Diagnosis present

## 2012-11-23 DIAGNOSIS — Z87891 Personal history of nicotine dependence: Secondary | ICD-10-CM

## 2012-11-23 DIAGNOSIS — R29898 Other symptoms and signs involving the musculoskeletal system: Secondary | ICD-10-CM | POA: Diagnosis present

## 2012-11-23 DIAGNOSIS — E1142 Type 2 diabetes mellitus with diabetic polyneuropathy: Secondary | ICD-10-CM | POA: Diagnosis present

## 2012-11-23 DIAGNOSIS — D638 Anemia in other chronic diseases classified elsewhere: Secondary | ICD-10-CM | POA: Diagnosis present

## 2012-11-23 DIAGNOSIS — W010XXA Fall on same level from slipping, tripping and stumbling without subsequent striking against object, initial encounter: Secondary | ICD-10-CM | POA: Diagnosis present

## 2012-11-23 DIAGNOSIS — I10 Essential (primary) hypertension: Secondary | ICD-10-CM | POA: Diagnosis present

## 2012-11-23 DIAGNOSIS — I69998 Other sequelae following unspecified cerebrovascular disease: Secondary | ICD-10-CM

## 2012-11-23 DIAGNOSIS — E1149 Type 2 diabetes mellitus with other diabetic neurological complication: Secondary | ICD-10-CM | POA: Diagnosis present

## 2012-11-23 DIAGNOSIS — Y92009 Unspecified place in unspecified non-institutional (private) residence as the place of occurrence of the external cause: Secondary | ICD-10-CM

## 2012-11-23 DIAGNOSIS — D72829 Elevated white blood cell count, unspecified: Secondary | ICD-10-CM | POA: Diagnosis present

## 2012-11-23 NOTE — ED Notes (Signed)
EMS called to home.  Found patient in chair.  Patient is alert and oriented x3.  She states that she fell and has right hip pain.

## 2012-11-24 ENCOUNTER — Encounter (HOSPITAL_COMMUNITY)
Admission: EM | Disposition: A | Discharge status: Skilled Nursing Facility | Payer: Self-pay | Source: Home / Self Care | Attending: Internal Medicine

## 2012-11-24 ENCOUNTER — Emergency Department (HOSPITAL_COMMUNITY): Payer: Medicare Other

## 2012-11-24 ENCOUNTER — Inpatient Hospital Stay (HOSPITAL_COMMUNITY): Payer: Medicare Other

## 2012-11-24 ENCOUNTER — Encounter (HOSPITAL_COMMUNITY): Payer: Self-pay | Admitting: Internal Medicine

## 2012-11-24 ENCOUNTER — Encounter (HOSPITAL_COMMUNITY): Payer: Self-pay | Admitting: Anesthesiology

## 2012-11-24 ENCOUNTER — Inpatient Hospital Stay (HOSPITAL_COMMUNITY): Payer: Medicare Other | Admitting: Anesthesiology

## 2012-11-24 ENCOUNTER — Inpatient Hospital Stay (HOSPITAL_COMMUNITY)
Admission: EM | Admit: 2012-11-24 | Discharge: 2012-11-27 | DRG: 470 | Disposition: A | Discharge status: Skilled Nursing Facility | Payer: Medicare Other | Attending: Internal Medicine | Admitting: Internal Medicine

## 2012-11-24 DIAGNOSIS — I6350 Cerebral infarction due to unspecified occlusion or stenosis of unspecified cerebral artery: Secondary | ICD-10-CM

## 2012-11-24 DIAGNOSIS — I639 Cerebral infarction, unspecified: Secondary | ICD-10-CM

## 2012-11-24 DIAGNOSIS — E1149 Type 2 diabetes mellitus with other diabetic neurological complication: Secondary | ICD-10-CM

## 2012-11-24 DIAGNOSIS — S72009A Fracture of unspecified part of neck of unspecified femur, initial encounter for closed fracture: Principal | ICD-10-CM

## 2012-11-24 DIAGNOSIS — I10 Essential (primary) hypertension: Secondary | ICD-10-CM

## 2012-11-24 DIAGNOSIS — E1142 Type 2 diabetes mellitus with diabetic polyneuropathy: Secondary | ICD-10-CM

## 2012-11-24 DIAGNOSIS — I6330 Cerebral infarction due to thrombosis of unspecified cerebral artery: Secondary | ICD-10-CM

## 2012-11-24 DIAGNOSIS — S72001A Fracture of unspecified part of neck of right femur, initial encounter for closed fracture: Secondary | ICD-10-CM

## 2012-11-24 HISTORY — PX: HIP ARTHROPLASTY: SHX981

## 2012-11-24 LAB — BASIC METABOLIC PANEL
CO2: 27 mEq/L (ref 19–32)
Chloride: 103 mEq/L (ref 96–112)
GFR calc non Af Amer: 62 mL/min — ABNORMAL LOW (ref >90)
Glucose, Bld: 206 mg/dL — ABNORMAL HIGH (ref 70–99)
Potassium: 3.7 mEq/L (ref 3.5–5.1)
Sodium: 137 mEq/L (ref 135–145)

## 2012-11-24 LAB — GLUCOSE, CAPILLARY
Glucose-Capillary: 218 mg/dL — ABNORMAL HIGH (ref 70–99)
Glucose-Capillary: 219 mg/dL — ABNORMAL HIGH (ref 70–99)
Glucose-Capillary: 167 mg/dL — ABNORMAL HIGH (ref 70–99)
Glucose-Capillary: 134 mg/dL — ABNORMAL HIGH (ref 70–99)

## 2012-11-24 LAB — URINALYSIS, ROUTINE W REFLEX MICROSCOPIC
Ketones, ur: NEGATIVE mg/dL
Leukocytes, UA: NEGATIVE
Nitrite: NEGATIVE
pH: 5 (ref 5.0–8.0)

## 2012-11-24 LAB — CBC
HCT: 37 % (ref 36.0–46.0)
Hemoglobin: 12.2 g/dL (ref 12.0–15.0)
MCHC: 33 g/dL (ref 30.0–36.0)
RBC: 4.57 MIL/uL (ref 3.87–5.11)
WBC: 12.7 10*3/uL — ABNORMAL HIGH (ref 4.0–10.5)

## 2012-11-24 LAB — APTT: aPTT: 31 seconds (ref 24–37)

## 2012-11-24 LAB — PROTIME-INR: INR: 1.11 (ref 0.00–1.49)

## 2012-11-24 SURGERY — HEMIARTHROPLASTY, HIP, DIRECT ANTERIOR APPROACH, FOR FRACTURE
Anesthesia: General | Site: Hip | Laterality: Right | Wound class: Clean

## 2012-11-24 SURGERY — HEMIARTHROPLASTY, HIP, DIRECT ANTERIOR APPROACH, FOR FRACTURE
Anesthesia: Choice | Site: Hip | Laterality: Right

## 2012-11-24 MED ORDER — INSULIN ASPART 100 UNIT/ML ~~LOC~~ SOLN
0.0000 [IU] | SUBCUTANEOUS | Status: DC
  Administered 2012-11-24 – 2012-11-25 (×2): 2 [IU] via SUBCUTANEOUS
  Administered 2012-11-25: 1 [IU] via SUBCUTANEOUS
  Administered 2012-11-25: 2 [IU] via SUBCUTANEOUS
  Administered 2012-11-25: 7 [IU] via SUBCUTANEOUS
  Administered 2012-11-26 (×3): 1 [IU] via SUBCUTANEOUS
  Administered 2012-11-26: 2 [IU] via SUBCUTANEOUS
  Administered 2012-11-27 (×2): 1 [IU] via SUBCUTANEOUS

## 2012-11-24 MED ORDER — PHENYLEPHRINE HCL 10 MG/ML IJ SOLN
10.0000 mg | INTRAVENOUS | Status: DC | PRN
  Administered 2012-11-24: 40 ug/min via INTRAVENOUS

## 2012-11-24 MED ORDER — FENTANYL CITRATE 0.05 MG/ML IJ SOLN
INTRAMUSCULAR | Status: DC | PRN
  Administered 2012-11-24 (×4): 50 ug via INTRAVENOUS

## 2012-11-24 MED ORDER — HYDROCODONE-ACETAMINOPHEN 5-325 MG PO TABS
1.0000 | ORAL_TABLET | Freq: Four times a day (QID) | ORAL | Status: DC | PRN
  Administered 2012-11-24 – 2012-11-27 (×7): 2 via ORAL
  Filled 2012-11-24 (×7): qty 2

## 2012-11-24 MED ORDER — PHENOL 1.4 % MT LIQD: 1.0000 | OROMUCOSAL | Status: DC | PRN

## 2012-11-24 MED ORDER — ACETAMINOPHEN 650 MG RE SUPP: 650.0000 mg | Freq: Four times a day (QID) | RECTAL | Status: DC | PRN

## 2012-11-24 MED ORDER — METOCLOPRAMIDE HCL 10 MG PO TABS: 5.0000 mg | ORAL_TABLET | Freq: Three times a day (TID) | ORAL | Status: DC | PRN

## 2012-11-24 MED ORDER — CLONAZEPAM 0.5 MG PO TABS
0.5000 mg | ORAL_TABLET | Freq: Two times a day (BID) | ORAL | Status: DC | PRN
  Administered 2012-11-24: 0.5 mg via ORAL
  Filled 2012-11-24: qty 1

## 2012-11-24 MED ORDER — HYDROMORPHONE HCL PF 1 MG/ML IJ SOLN
0.5000 mg | Freq: Once | INTRAMUSCULAR | Status: AC
  Administered 2012-11-24: 0.5 mg via INTRAVENOUS
  Filled 2012-11-24: qty 1

## 2012-11-24 MED ORDER — ASPIRIN 325 MG PO TABS
325.0000 mg | ORAL_TABLET | Freq: Every day | ORAL | Status: DC
  Administered 2012-11-25 – 2012-11-27 (×3): 325 mg via ORAL
  Filled 2012-11-24 (×4): qty 1

## 2012-11-24 MED ORDER — CITALOPRAM HYDROBROMIDE 10 MG PO TABS
10.0000 mg | ORAL_TABLET | Freq: Every day | ORAL | Status: DC
  Administered 2012-11-24 – 2012-11-27 (×4): 10 mg via ORAL
  Filled 2012-11-24 (×4): qty 1

## 2012-11-24 MED ORDER — ATORVASTATIN CALCIUM 40 MG PO TABS
40.0000 mg | ORAL_TABLET | Freq: Every day | ORAL | Status: DC
  Administered 2012-11-25 – 2012-11-26 (×2): 40 mg via ORAL
  Filled 2012-11-24 (×4): qty 1

## 2012-11-24 MED ORDER — MORPHINE SULFATE 2 MG/ML IJ SOLN: 0.5000 mg | INTRAMUSCULAR | Status: DC | PRN

## 2012-11-24 MED ORDER — ONDANSETRON HCL 4 MG PO TABS: 4.0000 mg | ORAL_TABLET | Freq: Four times a day (QID) | ORAL | Status: DC | PRN

## 2012-11-24 MED ORDER — HYDROMORPHONE HCL PF 1 MG/ML IJ SOLN: 0.2500 mg | INTRAMUSCULAR | Status: DC | PRN

## 2012-11-24 MED ORDER — ONDANSETRON HCL 4 MG/2ML IJ SOLN: 4.0000 mg | Freq: Four times a day (QID) | INTRAMUSCULAR | Status: DC | PRN

## 2012-11-24 MED ORDER — LACTATED RINGERS IV SOLN
INTRAVENOUS | Status: DC
Start: 2012-11-24 — End: 2012-11-24

## 2012-11-24 MED ORDER — DEXTROSE-NACL 5-0.9 % IV SOLN
INTRAVENOUS | Status: DC
  Administered 2012-11-24: 05:00:00 via INTRAVENOUS

## 2012-11-24 MED ORDER — ACETAMINOPHEN 325 MG PO TABS: 650.0000 mg | ORAL_TABLET | Freq: Four times a day (QID) | ORAL | Status: DC | PRN

## 2012-11-24 MED ORDER — SODIUM CHLORIDE 0.9 % IV SOLN
INTRAVENOUS | Status: DC
  Administered 2012-11-24: 22:00:00 via INTRAVENOUS

## 2012-11-24 MED ORDER — ONDANSETRON HCL 4 MG/2ML IJ SOLN
4.0000 mg | Freq: Once | INTRAMUSCULAR | Status: AC
  Administered 2012-11-24: 4 mg via INTRAVENOUS
  Filled 2012-11-24: qty 2

## 2012-11-24 MED ORDER — LACTATED RINGERS IV SOLN
INTRAVENOUS | Status: DC | PRN
  Administered 2012-11-24 (×2): via INTRAVENOUS

## 2012-11-24 MED ORDER — METOCLOPRAMIDE HCL 5 MG/ML IJ SOLN: 5.0000 mg | Freq: Three times a day (TID) | INTRAMUSCULAR | Status: DC | PRN

## 2012-11-24 MED ORDER — NEOSTIGMINE METHYLSULFATE 1 MG/ML IJ SOLN
INTRAMUSCULAR | Status: DC | PRN
  Administered 2012-11-24: 1 mg via INTRAVENOUS

## 2012-11-24 MED ORDER — MENTHOL 3 MG MT LOZG: 1.0000 | LOZENGE | OROMUCOSAL | Status: DC | PRN

## 2012-11-24 MED ORDER — ENOXAPARIN SODIUM 40 MG/0.4ML ~~LOC~~ SOLN
40.0000 mg | SUBCUTANEOUS | Status: DC
  Administered 2012-11-25 – 2012-11-27 (×3): 40 mg via SUBCUTANEOUS
  Filled 2012-11-24 (×4): qty 0.4

## 2012-11-24 MED ORDER — GLYCOPYRROLATE 0.2 MG/ML IJ SOLN
INTRAMUSCULAR | Status: DC | PRN
  Administered 2012-11-24: 0.2 mg via INTRAVENOUS

## 2012-11-24 MED ORDER — ROCURONIUM BROMIDE 100 MG/10ML IV SOLN
INTRAVENOUS | Status: DC | PRN
  Administered 2012-11-24: 20 mg via INTRAVENOUS

## 2012-11-24 MED ORDER — PROPOFOL 10 MG/ML IV BOLUS
INTRAVENOUS | Status: DC | PRN
  Administered 2012-11-24: 60 mg via INTRAVENOUS

## 2012-11-24 MED ORDER — DEXAMETHASONE SODIUM PHOSPHATE 10 MG/ML IJ SOLN
INTRAMUSCULAR | Status: DC | PRN
  Administered 2012-11-24: 10 mg via INTRAVENOUS

## 2012-11-24 MED ORDER — FERROUS SULFATE 325 (65 FE) MG PO TABS
325.0000 mg | ORAL_TABLET | Freq: Three times a day (TID) | ORAL | Status: DC
  Administered 2012-11-25 – 2012-11-27 (×8): 325 mg via ORAL
  Filled 2012-11-24 (×10): qty 1

## 2012-11-24 MED ORDER — CEFAZOLIN SODIUM-DEXTROSE 2-3 GM-% IV SOLR
2.0000 g | Freq: Four times a day (QID) | INTRAVENOUS | Status: AC
  Administered 2012-11-24 – 2012-11-25 (×2): 2 g via INTRAVENOUS
  Filled 2012-11-24 (×2): qty 50

## 2012-11-24 MED ORDER — CEFAZOLIN SODIUM-DEXTROSE 2-3 GM-% IV SOLR
INTRAVENOUS | Status: DC | PRN
  Administered 2012-11-24: 2 g via INTRAVENOUS

## 2012-11-24 MED ORDER — HYDROMORPHONE HCL PF 1 MG/ML IJ SOLN
1.0000 mg | INTRAMUSCULAR | Status: DC | PRN
  Administered 2012-11-24 – 2012-11-26 (×3): 1 mg via INTRAVENOUS
  Filled 2012-11-24 (×3): qty 1

## 2012-11-24 MED ORDER — PROMETHAZINE HCL 25 MG/ML IJ SOLN: 6.2500 mg | INTRAMUSCULAR | Status: DC | PRN

## 2012-11-24 MED ORDER — CHLORHEXIDINE GLUCONATE 4 % EX LIQD
60.0000 mL | Freq: Once | CUTANEOUS | Status: AC
  Administered 2012-11-24: 4 via TOPICAL
  Filled 2012-11-24: qty 60

## 2012-11-24 MED ORDER — ZOLPIDEM TARTRATE 5 MG PO TABS
5.0000 mg | ORAL_TABLET | Freq: Every evening | ORAL | Status: DC | PRN
  Administered 2012-11-25 – 2012-11-26 (×2): 5 mg via ORAL
  Filled 2012-11-24 (×2): qty 1

## 2012-11-24 MED ORDER — HYDROMORPHONE HCL PF 1 MG/ML IJ SOLN
0.5000 mg | INTRAMUSCULAR | Status: DC | PRN
  Administered 2012-11-24 (×2): 1 mg via INTRAVENOUS
  Filled 2012-11-24 (×2): qty 1

## 2012-11-24 MED ORDER — LIDOCAINE HCL (CARDIAC) 20 MG/ML IV SOLN
INTRAVENOUS | Status: DC | PRN
  Administered 2012-11-24: 50 mg via INTRAVENOUS

## 2012-11-24 SURGICAL SUPPLY — 38 items
BAG SPEC THK2 15X12 ZIP CLS (MISCELLANEOUS) ×1
BAG ZIPLOCK 12X15 (MISCELLANEOUS) ×2 IMPLANT
BLADE SAW SAG 73X25 THK (BLADE) ×1
BLADE SAW SGTL 73X25 THK (BLADE) ×1 IMPLANT
CAPT HIP HD POR BIPOL/UNIPOL ×1 IMPLANT
CLOTH BEACON ORANGE TIMEOUT ST (SAFETY) ×2 IMPLANT
DRAPE INCISE IOBAN 66X45 STRL (DRAPES) ×2 IMPLANT
DRAPE ORTHO SPLIT 77X108 STRL (DRAPES) ×4
DRAPE POUCH INSTRU U-SHP 10X18 (DRAPES) ×2 IMPLANT
DRAPE SURG ORHT 6 SPLT 77X108 (DRAPES) ×2 IMPLANT
DRAPE U-SHAPE 47X51 STRL (DRAPES) ×2 IMPLANT
DRSG EMULSION OIL 3X16 NADH (GAUZE/BANDAGES/DRESSINGS) ×2 IMPLANT
DRSG MEPILEX BORDER 4X4 (GAUZE/BANDAGES/DRESSINGS) IMPLANT
DRSG MEPILEX BORDER 4X8 (GAUZE/BANDAGES/DRESSINGS) ×2 IMPLANT
DRSG PAD ABDOMINAL 8X10 ST (GAUZE/BANDAGES/DRESSINGS) ×1 IMPLANT
ELECT REM PT RETURN 9FT ADLT (ELECTROSURGICAL) ×2
ELECTRODE REM PT RTRN 9FT ADLT (ELECTROSURGICAL) ×1 IMPLANT
EVACUATOR 1/8 PVC DRAIN (DRAIN) IMPLANT
GAUZE XEROFORM 5X9 LF (GAUZE/BANDAGES/DRESSINGS) ×1 IMPLANT
GLOVE ORTHO TXT STRL SZ7.5 (GLOVE) ×2 IMPLANT
GLOVE SURG ORTHO 8.5 STRL (GLOVE) ×2 IMPLANT
GOWN STRL NON-REIN LRG LVL3 (GOWN DISPOSABLE) ×4 IMPLANT
IMMOBILIZER KNEE 20 (SOFTGOODS)
IMMOBILIZER KNEE 20 THIGH 36 (SOFTGOODS) IMPLANT
MANIFOLD NEPTUNE II (INSTRUMENTS) ×2 IMPLANT
PACK TOTAL JOINT (CUSTOM PROCEDURE TRAY) ×2 IMPLANT
POSITIONER SURGICAL ARM (MISCELLANEOUS) ×2 IMPLANT
SPONGE GAUZE 4X4 12PLY (GAUZE/BANDAGES/DRESSINGS) ×2 IMPLANT
STAPLER VISISTAT 35W (STAPLE) IMPLANT
STRIP CLOSURE SKIN 1/2X4 (GAUZE/BANDAGES/DRESSINGS) IMPLANT
SUT MNCRL AB 4-0 PS2 18 (SUTURE) IMPLANT
SUT VIC AB 1 CT1 36 (SUTURE) ×8 IMPLANT
SUT VIC AB 2-0 CT1 27 (SUTURE) ×6
SUT VIC AB 2-0 CT1 TAPERPNT 27 (SUTURE) ×3 IMPLANT
TAPE CLOTH SURG 6X10 WHT LF (GAUZE/BANDAGES/DRESSINGS) ×2 IMPLANT
TOWEL OR 17X26 10 PK STRL BLUE (TOWEL DISPOSABLE) ×4 IMPLANT
TOWER CARTRIDGE SMART MIX (DISPOSABLE) ×2 IMPLANT
TRAY FOLEY CATH 14FRSI W/METER (CATHETERS) IMPLANT

## 2012-11-24 NOTE — Transfer of Care (Signed)
Immediate Anesthesia Transfer of Care Note  Patient: Sophia Rodriguez  Procedure(s) Performed: Procedure(s) with comments: RIGHT HIP HEMIARTHROPLASTY (Right) - hemiatroplasty, DePuy Triloc  Patient Location: PACU  Anesthesia Type:General  Level of Consciousness: awake, patient cooperative, lethargic and responds to stimulation  Airway & Oxygen Therapy: Patient Spontanous Breathing and Patient connected to face mask oxygen  Post-op Assessment: Report given to PACU RN, Post -op Vital signs reviewed and stable and Patient moving all extremities  Post vital signs: Reviewed and stable  Complications: No apparent anesthesia complications

## 2012-11-24 NOTE — Progress Notes (Signed)
Clinical Social Work Department CLINICAL SOCIAL WORK PLACEMENT NOTE 11/24/2012  Patient:  Sophia Rodriguez, Sophia Rodriguez  Account Number:  [id number] Admit date:  11/23/2012  Clinical Social Worker:  Cori Razor, LCSW  Date/time:  11/24/2012 01:59 PM  Clinical Social Work is seeking post-discharge placement for this patient at the following level of care:   SKILLED NURSING   (*CSW will update this form in Epic as items are completed)     Patient/family provided with [facility] Department of Clinical Social Work's list of facilities offering this level of care within the geographic area requested by the patient (or if unable, by the patient's family).  11/24/2012  Patient/family informed of their freedom to choose among providers that offer the needed level of care, that participate in Medicare, Medicaid or managed care program needed by the patient, have an available bed and are willing to accept the patient.    Patient/family informed of [facility]' ownership interest in [facility], as well as of the fact that they are under no obligation to receive care at this facility.  PASARR submitted to EDS on  PASARR number received from EDS on 02/26/2009  FL2 transmitted to all facilities in geographic area requested by pt/family on  11/24/2012 FL2 transmitted to all facilities within larger geographic area on   Patient informed that his/her managed care company has contracts with or will negotiate with  certain facilities, including the following:     Patient/family informed of bed offers received:   Patient chooses bed at  Physician recommends and patient chooses bed at    Patient to be transferred to  on   Patient to be transferred to facility by   The following physician request were entered in Epic:   Additional Comments:  Cori Razor LCSW [phone]

## 2012-11-24 NOTE — H&P (Signed)
[facility] History and Physical  Aya Geisel ZOX:[id number] DOB: 1950/08/31    PCP:   Dorrene German, MD   Chief Complaint: right hip fracture from a mechanical fall.  HPI: Sophia Rodriguez is an 62 y.o. female with hx of recent CVA undergone rehab, HTN, DM2, presents to the ER with a fall at home tonight.  She did not loose consciousness, but rather slipped and fell.  Evaluation in the ER included an Xray which showed a femoral neck fracture.  She also has an EKG showing NSR with no acute ST-T changes.  Her CXR showed no infiltrate nor edema.  She has normal Hb, normal electrolytes, and normal renal fx tests.  Her BS was in the 200's.  She was seen in consultation with Dr Malon Kindle of orthopedics, and pending clearance, planned to do ORIF later today.  Hospitalist was asked to admit her for right hip Fx.  Rewiew of Systems:  Constitutional: Negative for malaise, fever and chills. No significant weight loss or weight gain Eyes: Negative for eye pain, redness and discharge, diplopia, visual changes, or flashes of light. ENMT: Negative for ear pain, hoarseness, nasal congestion, sinus pressure and sore throat. No headaches; tinnitus, drooling, or problem swallowing. Cardiovascular: Negative for chest pain, palpitations, diaphoresis, dyspnea and peripheral edema. ; No orthopnea, PND Respiratory: Negative for cough, hemoptysis, wheezing and stridor. No pleuritic chestpain. Gastrointestinal: Negative for nausea, vomiting, diarrhea, constipation, abdominal pain, melena, blood in stool, hematemesis, jaundice and rectal bleeding.    Genitourinary: Negative for frequency, dysuria, incontinence,flank pain and hematuria; Musculoskeletal: Negative for back pain and neck pain. Negative for swelling and trauma.;  Skin: . Negative for pruritus, rash, abrasions, bruising and skin lesion.; ulcerations Neuro: Negative for headache, lightheadedness and neck stiffness. Negative for weakness, altered  level of consciousness , altered mental status, extremity weakness, burning feet, involuntary movement, seizure and syncope.  Psych: negative for anxiety, depression, insomnia, tearfulness, panic attacks, hallucinations, paranoia, suicidal or homicidal ideation    Past Medical History  Diagnosis Date  . Stroke   . Hypertension   . Diabetes mellitus     History reviewed. No pertinent past surgical history.  Medications:  HOME MEDS: Prior to Admission medications   Medication Sig Start Date End Date Taking? Authorizing Provider  aspirin 325 MG tablet Take 1 tablet (325 mg total) by mouth daily. 10/05/12  Yes Daniel J Angiulli, PA-C  atorvastatin (LIPITOR) 40 MG tablet Take 1 tablet (40 mg total) by mouth daily at 6 PM. 10/05/12  Yes Daniel J Angiulli, PA-C  citalopram (CELEXA) 10 MG tablet Take 1 tablet (10 mg total) by mouth daily. 10/05/12  Yes Daniel J Angiulli, PA-C  clonazePAM (KLONOPIN) 0.5 MG tablet Take 1 tablet (0.5 mg total) by mouth daily as needed for anxiety. 10/05/12  Yes Daniel J Angiulli, PA-C  Multiple Vitamin (MULTIVITAMIN WITH MINERALS) TABS Take 1 tablet by mouth daily. 10/05/12  Yes Daniel J Angiulli, PA-C  zolpidem (AMBIEN) 5 MG tablet Take 1 tablet (5 mg total) by mouth at bedtime as needed for sleep. 10/05/12  Yes Daniel J Angiulli, PA-C     Allergies:  No Known Allergies  Social History:   reports that she quit smoking about 5 years ago. She does not have any smokeless tobacco history on file. She reports that she does not drink alcohol or use illicit drugs.  Family History: Family History  Problem Relation Age of Onset  . Stroke Brother      Physical Exam: Filed Vitals:  11/24/12 0005 11/24/12 0308  BP: 112/77 113/77  Pulse: 79   Temp: 97.5 F (36.4 C) 97.9 F (36.6 C)  TempSrc: Oral Oral  Resp: 2 13  SpO2: 95% 94%   Blood pressure 113/77, pulse 79, temperature 97.9 F (36.6 C), temperature source Oral, resp. rate 13, SpO2 94.00%.  GEN:   Pleasant  patient lying in the stretcher in no acute distress; cooperative with exam. PSYCH:  alert and oriented x4; does not appear anxious or depressed; affect is appropriate. HEENT: Mucous membranes pink and anicteric; PERRLA; EOM intact; no cervical lymphadenopathy nor thyromegaly or carotid bruit; no JVD; There were no stridor. Neck is very supple. Breasts:: Not examined CHEST WALL: No tenderness CHEST: Normal respiration, clear to auscultation bilaterally.  HEART: Regular rate and rhythm.  There are no murmur, rub, or gallops.   BACK: No kyphosis or scoliosis; no CVA tenderness ABDOMEN: soft and non-tender; no masses, no organomegaly, normal abdominal bowel sounds; no pannus; no intertriginous candida. There is no rebound and no distention. Rectal Exam: Not done EXTREMITIES: No bone or joint deformity; age-appropriate arthropathy of the hands and knees; no edema; no ulcerations.  There is no calf tenderness.  Her right leg is shorter than left and externally rotated. Genitalia: not examined PULSES: 2+ and symmetric SKIN: Normal hydration no rash or ulceration CNS: unable to fully evaluate, but speech is fluent and she has facial symmetry.  She moves all her extremities.  Labs on Admission:  Basic Metabolic Panel:  Recent Labs Lab 11/24/12 0140  NA 137  K 3.7  CL 103  CO2 27  GLUCOSE 206*  BUN 16  CREATININE 0.97  CALCIUM 9.8   Liver Function Tests: No results found for this basename: AST, ALT, ALKPHOS, BILITOT, PROT, ALBUMIN,  in the last 168 hours No results found for this basename: LIPASE, AMYLASE,  in the last 168 hours No results found for this basename: AMMONIA,  in the last 168 hours CBC:  Recent Labs Lab 11/24/12 0140  WBC 12.7*  HGB 12.2  HCT 37.0  MCV 81.0  PLT 211   Cardiac Enzymes: No results found for this basename: CKTOTAL, CKMB, CKMBINDEX, TROPONINI,  in the last 168 hours  CBG: No results found for this basename: GLUCAP,  in the last 168  hours   Radiological Exams on Admission: Dg Chest 2 View  11/24/2012   *RADIOLOGY REPORT*  Clinical Data: Shortness of breath.  CHEST - 2 VIEW  Comparison: 09/22/2012.  Findings: Low volume lungs.  Suggestion of retrocardiac opacity in the frontal projection likely related to hypoaeration given the lateral view.  Borderline cardiomegaly.  No effusion or pneumothorax.  No acute osseous findings.  IMPRESSION: Low lung volumes without edema or definite infiltrate.   Original Report Authenticated By: Tiburcio Pea   Dg Hip Complete Right  11/24/2012   *RADIOLOGY REPORT*  Clinical Data: Fall with right hip pain  RIGHT HIP - COMPLETE 2+ VIEW  Comparison: None.  Findings: The femoral head is located.  The right femoral head is located.  There are mild degenerative changes of both hips. Abdominal pannus projects over the proximal right femur.  There is a lucency with an adjacent sclerotic line in the medial right femoral head, best seen on the AP view of the right hip. Findings are suspicious for an acute nondisplaced fracture.  Small osteophyte is seen laterally along the femoral head.  No acute fracture the pelvic ring is identified.  Atherosclerotic calcifications of the right femoral vasculature noted.  IMPRESSION:  Linear lucency with adjacent sclerosis in the medial right femoral neck is suspicious for a non-displaced acute fracture.   Original Report Authenticated By: Britta Mccreedy, M.D.    EKG: Independently reviewed. NSR with no acute ST-T changes.   Assessment/Plan Present on Admission:  . Hypertension . DM type 2 causing neurological disease . 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  PLAN:  Will admit her for ORIF later today.  I have continued her NPO, and only gave SCD for DVT prophylaxis.  Accepting an increased risk for cardiovascular event peri-operatively, she is cleared for surgery.  For her DM, will give D5NS since NPO, and use SSI sensitive for coverage.  She is otherwise  stable, full code, and will be admitted to [facility] service, anticipating hip surgery later today.  Thank you for asking me to participate in her care.  Other plans as per orders.  Code Status: FULL Unk Lightning, MD. [facility] Pager [phone] 7pm to 7am.  11/24/2012, 3:29 AM

## 2012-11-24 NOTE — Progress Notes (Signed)
Clinical Social Work Department BRIEF PSYCHOSOCIAL ASSESSMENT 11/24/2012  Patient:  Sophia Rodriguez, Sophia Rodriguez     Account Number:  [id number]     Admit date:  11/23/2012  Clinical Social Worker:  Candie Chroman  Date/Time:  11/24/2012 01:39 PM  Referred by:  Physician  Date Referred:  11/24/2012 Referred for  SNF Placement   Other Referral:   Interview type:  Family Other interview type:    PSYCHOSOCIAL DATA Living Status:  ALONE Admitted from facility:   Level of care:   Primary support name:  Evel Drum-Gould Primary support relationship to patient:  CHILD, ADULT Degree of support available:   supportive    CURRENT CONCERNS Current Concerns  Post-Acute Placement   Other Concerns:    SOCIAL WORK ASSESSMENT / PLAN Pt is a 62 yr old female living at home prior to hospitalization. CSW met briefly with pt this am. She was very sleepy during visit . Pt gave CSW permission to contact daughter. Pt's daughter contacted and told CSW MD felt pt will need six weeks of rehab following hospital d/c. Pt had rehab at [facility] ( following her CVA in June ) this year. A community SNF will be needed this time . Pt has been to [facility] in the past and daughter thinks pt may be more comfortable returning there for SNF. CSW will meet with pt again tomorrow and contact daughter to continue assisting with d/c planning. Pt is scheduled for an ORIF late this afternoon.   Assessment/plan status:  Psychosocial Support/Ongoing Assessment of Needs Other assessment/ plan:   Information/referral to community resources:   SNF list with bed offers to be provided 11/25/12.    PATIENT'S/FAMILY'S RESPONSE TO PLAN OF CARE: Pt's daughter feels pt will not want to go to SNF but she is unable to provide 24/7 care at home. CSW and family will encourage pt to accept [facility] . Plan to meet with pt following hip surgery and PT recommendations.   Cori Razor LCSW [phone]

## 2012-11-24 NOTE — ED Provider Notes (Signed)
CSN: [id number]     Arrival date & time 11/23/12  2358 History   First MD Initiated Contact with Patient 11/24/12 0016     Chief Complaint  Patient presents with  . Fall  . Hip Pain    right hip   (Consider location/radiation/quality/duration/timing/severity/associated sxs/prior Treatment) Patient is a 62 y.o. female presenting with fall and hip pain. The history is provided by the patient and medical records. No language interpreter was used.  Fall Associated symptoms include arthralgias, joint swelling and myalgias. Pertinent negatives include no abdominal pain, chest pain, coughing, diaphoresis, fatigue, fever, headaches, nausea, neck pain, numbness, rash or vomiting.  Hip Pain Associated symptoms include arthralgias, joint swelling and myalgias. Pertinent negatives include no abdominal pain, chest pain, coughing, diaphoresis, fatigue, fever, headaches, nausea, neck pain, numbness, rash or vomiting.    Sophia Rodriguez is a 62 y.o. female  with a hx of CVA, HTN, diabetes presents to the Emergency Department complaining of acute, persistent, constant right hip pain beginning prior to arrival.  Pt sates she was standing at her dresser with her walker looking at jewelry when she leaned over and lost her balance. She denies hitting her head or LOC.  Pt also denies neck or back pain.  Associated symptoms include weakness in the right leg 2/2 pain.  Nothing makes it better and palpation and movement makes it worse.  Pt denies fever, chills, headache, neck pain, back pain, chest pain, SOB, abd pain, N/V/D, weakness, dizziness, syncope, dysuria, hematuria.      Past Medical History  Diagnosis Date  . Stroke   . Hypertension   . Diabetes mellitus    No past surgical history on file. Family History  Problem Relation Age of Onset  . Stroke Brother    History  Substance Use Topics  . Smoking status: Former Smoker    Quit date: 05/28/2007  . Smokeless tobacco: Not on file  . Alcohol Use:  No   OB History   Grav Para Term Preterm Abortions TAB SAB Ect Mult Living                 Review of Systems  Constitutional: Negative for fever, diaphoresis, appetite change, fatigue and unexpected weight change.  HENT: Negative for mouth sores, neck pain and neck stiffness.   Eyes: Negative for visual disturbance.  Respiratory: Negative for cough, chest tightness, shortness of breath and wheezing.   Cardiovascular: Negative for chest pain.  Gastrointestinal: Negative for nausea, vomiting, abdominal pain, diarrhea and constipation.  Endocrine: Negative for polydipsia, polyphagia and polyuria.  Genitourinary: Negative for dysuria, urgency, frequency and hematuria.  Musculoskeletal: Positive for myalgias, joint swelling, arthralgias and gait problem. Negative for back pain.  Skin: Negative for rash and wound.  Allergic/Immunologic: Negative for immunocompromised state.  Neurological: Negative for syncope, light-headedness, numbness and headaches.  Hematological: Does not bruise/bleed easily.  Psychiatric/Behavioral: Negative for sleep disturbance. The patient is not nervous/anxious.   All other systems reviewed and are negative.    Allergies  Review of patient's allergies indicates no known allergies.  Home Medications   Current Outpatient Rx  Name  Route  Sig  Dispense  Refill  . aspirin 325 MG tablet   Oral   Take 1 tablet (325 mg total) by mouth daily.         Marland Kitchen atorvastatin (LIPITOR) 40 MG tablet   Oral   Take 1 tablet (40 mg total) by mouth daily at 6 PM.   30 tablet   1   .  citalopram (CELEXA) 10 MG tablet   Oral   Take 1 tablet (10 mg total) by mouth daily.   30 tablet   1   . clonazePAM (KLONOPIN) 0.5 MG tablet   Oral   Take 1 tablet (0.5 mg total) by mouth daily as needed for anxiety.   30 tablet   0   . Multiple Vitamin (MULTIVITAMIN WITH MINERALS) TABS   Oral   Take 1 tablet by mouth daily.         Marland Kitchen zolpidem (AMBIEN) 5 MG tablet   Oral    Take 1 tablet (5 mg total) by mouth at bedtime as needed for sleep.   30 tablet   0    BP 112/77  Pulse 79  Temp(Src) 97.5 F (36.4 C) (Oral)  Resp 2  SpO2 95% Physical Exam  Nursing note and vitals reviewed. Constitutional: She is oriented to person, place, and time. She appears well-developed and well-nourished. No distress.  Awake, alert, nontoxic appearance  HENT:  Head: Normocephalic and atraumatic.  Mouth/Throat: Oropharynx is clear and moist. No oropharyngeal exudate.  Eyes: Conjunctivae are normal. Pupils are equal, round, and reactive to light. No scleral icterus.  Neck: Normal range of motion. Neck supple.  No midline tenderness  Cardiovascular: Normal rate, regular rhythm, S1 normal, S2 normal, normal heart sounds and intact distal pulses.   No murmur heard. Pulses:      Radial pulses are 2+ on the right side, and 2+ on the left side.       Dorsalis pedis pulses are 2+ on the right side, and 2+ on the left side.  Capillary refill < 3 sec  Pulmonary/Chest: Effort normal and breath sounds normal. No respiratory distress. She has no wheezes. She has no rales.  Abdominal: Soft. Bowel sounds are normal. She exhibits no mass. There is no tenderness. There is no rebound and no guarding.  Musculoskeletal: She exhibits tenderness. She exhibits no edema.       Right hip: She exhibits decreased range of motion, decreased strength, tenderness and bony tenderness. She exhibits no swelling, no crepitus, no deformity and no laceration.       Right knee: She exhibits decreased range of motion. She exhibits no swelling. No tenderness found.       Cervical back: Normal.       Thoracic back: Normal.       Lumbar back: Normal.  ROM: Very limited ROM of the right hip and knee 2/2 pain; full ROM of the right ankle  NO midline tenderness of the Thoracic or Lumbar spine  Right leg appears mildly shortened and externally rotated  Lymphadenopathy:    She has no cervical adenopathy.   Neurological: She is alert and oriented to person, place, and time. Coordination normal.  Sensation intact to dull and sharp Strength 5/5 dorsiflexion and plantar flexion; strength of knee and hip not tested 2/2 pain  Skin: Skin is warm and dry. She is not diaphoretic.  No tenting of the skin  Psychiatric: She has a normal mood and affect.    ED Course  Procedures (including critical care time) Labs Review Labs Reviewed  CBC  BASIC METABOLIC PANEL   Imaging Review Dg Hip Complete Right  11/24/2012   *RADIOLOGY REPORT*  Clinical Data: Fall with right hip pain  RIGHT HIP - COMPLETE 2+ VIEW  Comparison: None.  Findings: The femoral head is located.  The right femoral head is located.  There are mild degenerative changes of both hips.  Abdominal pannus projects over the proximal right femur.  There is a lucency with an adjacent sclerotic line in the medial right femoral head, best seen on the AP view of the right hip. Findings are suspicious for an acute nondisplaced fracture.  Small osteophyte is seen laterally along the femoral head.  No acute fracture the pelvic ring is identified.  Atherosclerotic calcifications of the right femoral vasculature noted.  IMPRESSION:  Linear lucency with adjacent sclerosis in the medial right femoral neck is suspicious for a non-displaced acute fracture.   Original Report Authenticated By: Britta Mccreedy, M.D.    MDM   1. 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 Hypertension   4. Stroke    Susanne Baumgarner presents after fall with right hip pain.  X-ray with evidence of medical right femoral neck fracture without displacement.  Pt clinically consistent with hip fracture.  I personally reviewed the imaging tests through PACS system.  I reviewed available ER/hospitalization records through the EMR.  Labs pending will consult Ortho and plan for admission.  Dr. Denton Lank was consulted, evaluated this patient with me  and agrees with the plan.         Dahlia Client Raphaella Larkin, PA-C 11/24/12 [phone]

## 2012-11-24 NOTE — Progress Notes (Signed)
INITIAL NUTRITION ASSESSMENT  DOCUMENTATION CODES Per approved criteria  -Not Applicable   INTERVENTION: Provide Ensure Complete BID when diet diet advanced Recommend SLP consult (pt lethargic and missing teeth; ?dentures)  NUTRITION DIAGNOSIS: Predicted suboptimal energy intake related to scheduled surgery/lethargy/recent wt loss as evidenced by medical chart and 5% wt loss in less than 2 months    Goal: Pt to meet >/= 90% of their estimated nutrition needs   Monitor:  Diet advancement/PO intake Weight Labs  Reason for Assessment: Malnutrition Screening Tool, score of 2  62 y.o. female  Admitting Dx: Closed fracture of right hip  ASSESSMENT: 62 y.o. female with hx of recent CVA undergone rehab, HTN, DM2, presents to the ER with a fall at home tonight. She did not loose consciousness, but rather slipped and fell. Evaluation in the ER included an Xray which showed a femoral neck fracture.  Pt very lethargic at time of visit and unable to answer questions. Per RN pt has been this way since admission. Pt does not appear to have any teeth; RN states he saw a denture tray in the room but, he is unsure if pt has dentures with her. Per chart pt was on a carb modified diet PTA.   Height: Ht Readings from Last 1 Encounters:  09/22/12 5\' 6"  (1.676 m)    Weight: Wt Readings from Last 1 Encounters:  11/24/12 157 lb 6.5 oz (71.4 kg)    Ideal Body Weight: 130 lbs  % Ideal Body Weight: 120%  Wt Readings from Last 10 Encounters:  11/24/12 157 lb 6.5 oz (71.4 kg)  11/24/12 157 lb 6.5 oz (71.4 kg)  11/24/12 157 lb 6.5 oz (71.4 kg)  09/29/12 166 lb 0.1 oz (75.3 kg)  09/22/12 164 lb 14.4 oz (74.798 kg)  09/10/12 167 lb 3.2 oz (75.841 kg)  08/26/12 165 lb (74.844 kg)    Usual Body Weight: 165 lbs  % Usual Body Weight: 95%  BMI:  Body mass index is 25.42 kg/(m^2).  Estimated Nutritional Needs: Kcal: 1700-1900 Protein: 75-85 grams Fluid: 2.2 L  Skin: WDL  Diet Order:  NPO  EDUCATION NEEDS: -No education needs identified at this time   Intake/Output Summary (Last 24 hours) at 11/24/12 1244 Last data filed at 11/24/12 1000  Gross per 24 hour  Intake    270 ml  Output     75 ml  Net    195 ml    Last BM: 9/2  Labs:   Recent Labs Lab 11/24/12 0140  NA 137  K 3.7  CL 103  CO2 27  BUN 16  CREATININE 0.97  CALCIUM 9.8  GLUCOSE 206*    CBG (last 3)   Recent Labs  11/24/12 1211  GLUCAP 167*    Scheduled Meds: . aspirin  325 mg Oral Daily  . atorvastatin  40 mg Oral q1800  . chlorhexidine  60 mL Topical Once  . citalopram  10 mg Oral Daily  . insulin aspart  0-9 Units Subcutaneous Q4H    Continuous Infusions: . dextrose 5 % and 0.9% NaCl 50 mL/hr at 11/24/12 0436    Past Medical History  Diagnosis Date  . Stroke   . Hypertension   . Diabetes mellitus     History reviewed. No pertinent past surgical history.  Ian Malkin RD, LDN Inpatient Clinical Dietitian Pager: [phone] After Hours Pager: [phone]

## 2012-11-24 NOTE — Anesthesia Preprocedure Evaluation (Addendum)
Anesthesia Evaluation  Patient identified by MRN, date of birth, ID band Patient awake    Reviewed: Allergy & Precautions, H&P , NPO status , Patient's Chart, lab work & pertinent test results  Airway Mallampati: II TM Distance: <3 FB Neck ROM: Full    Dental no notable dental hx.    Pulmonary neg pulmonary ROS,  breath sounds clear to auscultation  Pulmonary exam normal       Cardiovascular hypertension, Pt. on medications Rhythm:Regular Rate:Normal     Neuro/Psych 62 y.o. 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 She was admitted on 08/26/12 with 24 hour history of difficulty walking, decrease in balance as well as increased right sided weakness. MRI of the brain showed acute nonhemorrhagic infarct at the junction of the right thalamus and posterior limb of the right internal capsule as well as remote moderate to large size infarct medial aspect left frontal and parietal lobes as well as left globus pallidus and remote left paracentral small infarct. MRA of the head with atherosclerotic type changes. MRA of the neck without significant stenosis. Echocardiogram with EF 60-65% and source of emboli. Carotid Dopplers with no ICA stenosis. Neurology recommended changing patient to aggrenox for thrombotic stroke due to SVD Extension of R thalamic and PLIC infarct 04/01/12  CVA x 2 in 2014  CVA, Residual Symptoms negative psych ROS   GI/Hepatic negative GI ROS, Neg liver ROS,   Endo/Other  diabetes  Renal/GU negative Renal ROS  negative genitourinary   Musculoskeletal negative musculoskeletal ROS (+)   Abdominal   Peds negative pediatric ROS (+)  Hematology negative hematology ROS (+)   Anesthesia Other Findings   Reproductive/Obstetrics negative OB ROS                           Anesthesia Physical Anesthesia Plan  ASA: IV  Anesthesia Plan: General   Post-op Pain Management:    Induction: Intravenous  Airway Management Planned: Oral ETT  Additional Equipment:   Intra-op Plan:   Post-operative Plan: Extubation in OR  Informed Consent: I have reviewed the patients History and Physical, chart, labs and discussed the procedure including the risks, benefits and alternatives for the proposed anesthesia with the patient or authorized representative who has indicated his/her understanding and acceptance.   Dental advisory given  Plan Discussed with: CRNA and Surgeon  Anesthesia Plan Comments:         Anesthesia Quick Evaluation

## 2012-11-24 NOTE — Preoperative (Signed)
Beta Blockers   Reason not to administer Beta Blockers:Not Applicable 

## 2012-11-24 NOTE — ED Notes (Signed)
Noted shortening of the right leg and nominal outward rotation.

## 2012-11-24 NOTE — Progress Notes (Signed)
Pt seen and examined, please see earlier note by dr. Conley Rolls. Pt admitted with right hip fracture from a mechanical fall. Currently hemodynamically stable. Plan for surgery today. BMP and CBC in AM.  Debbora Presto, MD  [facility] Pager [phone]  If 7PM-7AM, please contact night-coverage [email or web address] Password [email or web address]

## 2012-11-24 NOTE — Progress Notes (Signed)
Utilization review completed.  

## 2012-11-24 NOTE — Anesthesia Postprocedure Evaluation (Signed)
  Anesthesia Post-op Note  Patient: Sophia Rodriguez  Procedure(s) Performed: Procedure(s) (LRB): RIGHT HIP HEMIARTHROPLASTY (Right)  Patient Location: PACU  Anesthesia Type: General  Level of Consciousness: awake and alert   Airway and Oxygen Therapy: Patient Spontanous Breathing  Post-op Pain: mild  Post-op Assessment: Post-op Vital signs reviewed, Patient's Cardiovascular Status Stable, Respiratory Function Stable, Patent Airway and No signs of Nausea or vomiting  Last Vitals:  Filed Vitals:   11/24/12 2030  BP: 130/70  Pulse: 85  Temp:   Resp: 9    Post-op Vital Signs: stable   Complications: No apparent anesthesia complications

## 2012-11-24 NOTE — Consult Note (Signed)
Reason for Consult: right hip fracture Referring Physician: EDP  Sophia Rodriguez is an 62 y.o. female.  HPI: 62 yo female s/p mechanical fall this evening at home.  Patient complained of immediate pain in the right hip/groin.  Patient unable to stand after the fall.  Denies other complaints. Patient recently s/p CVA with admission for that. Not on blood thinners.  Past Medical History  Diagnosis Date  . Stroke   . Hypertension   . Diabetes mellitus     No past surgical history on file.  Family History  Problem Relation Age of Onset  . Stroke Brother     Social History:  reports that she quit smoking about 5 years ago. She does not have any smokeless tobacco history on file. She reports that she does not drink alcohol or use illicit drugs. Here with her daughter.  Allergies: No Known Allergies  Medications: I have reviewed the patient's current medications.  No results found for this or any previous visit (from the past 48 hour(s)).  Dg Hip Complete Right  11/24/2012   *RADIOLOGY REPORT*  Clinical Data: Fall with right hip pain  RIGHT HIP - COMPLETE 2+ VIEW  Comparison: None.  Findings: The femoral head is located.  The right femoral head is located.  There are mild degenerative changes of both hips. Abdominal pannus projects over the proximal right femur.  There is a lucency with an adjacent sclerotic line in the medial right femoral head, best seen on the AP view of the right hip. Findings are suspicious for an acute nondisplaced fracture.  Small osteophyte is seen laterally along the femoral head.  No acute fracture the pelvic ring is identified.  Atherosclerotic calcifications of the right femoral vasculature noted.  IMPRESSION:  Linear lucency with adjacent sclerosis in the medial right femoral neck is suspicious for a non-displaced acute fracture.   Original Report Authenticated By: Britta Mccreedy, M.D.    ROS Blood pressure 112/77, pulse 79, temperature 97.5 F (36.4 C),  temperature source Oral, resp. rate 2, SpO2 95.00%. Physical Exam Elderly female in moderate distress, bilateral UEs with pain free normal ROM, chest NT, normal excursion, abd soft, left LE with pain free normal ROM, Right LE with severe pain with any ROM including log roll  Assessment/Plan: Right displaced and unstable femoral neck fracture. Plan admission and medical clearance per hospitalist service.  Hopefully we can proceed with right hip hemiarthroplasty later today. Please maintain NPO for now.  Thanks!  Sophia Rodriguez,STEVEN R 11/24/2012, 2:01 AM

## 2012-11-24 NOTE — ED Provider Notes (Signed)
Medical screening examination/treatment/procedure(s) were conducted as a shared visit with non-physician practitioner(s) and myself.  I personally evaluated the patient during the encounter Pt s/p fall c/o hip pain. fx on xry. Pt comfortable w meds. Distal pulse palp. No numbness.   Suzi Roots, MD 11/24/12 [phone]

## 2012-11-24 NOTE — Brief Op Note (Signed)
11/24/2012  7:46 PM  PATIENT:  Sophia Rodriguez  62 y.o. female  PRE-OPERATIVE DIAGNOSIS:  right hip femoral neck fracture, unstable  POST-OPERATIVE DIAGNOSIS:  RIGHT HIP FEMORAL NECK FRACTURE, unstable  PROCEDURE:  Procedure(s) with comments: RIGHT HIP HEMIARTHROPLASTY (Right) - hemiatroplasty, DePuy Triloc  SURGEON:  Surgeon(s) and Role    * Verlee Rossetti, MD - Primary  PHYSICIAN ASSISTANT:   ASSISTANTS: Marciano Sequin, PA-C   ANESTHESIA:   general  EBL:     BLOOD ADMINISTERED:none  DRAINS: none   LOCAL MEDICATIONS USED:  NONE  SPECIMEN:  No Specimen  DISPOSITION OF SPECIMEN:  N/A  COUNTS:  YES  TOURNIQUET:  * No tourniquets in log *  DICTATION: .Other Dictation: Dictation Number [id number]  PLAN OF CARE: Admit to inpatient   PATIENT DISPOSITION:  PACU - hemodynamically stable.   Delay start of Pharmacological VTE agent (>24hrs) due to surgical blood loss or risk of bleeding: no

## 2012-11-25 ENCOUNTER — Encounter (HOSPITAL_COMMUNITY): Payer: Self-pay | Admitting: Orthopedic Surgery

## 2012-11-25 LAB — CBC
MCH: 26.5 pg (ref 26.0–34.0)
MCHC: 32.3 g/dL (ref 30.0–36.0)
RDW: 14.3 % (ref 11.5–15.5)

## 2012-11-25 LAB — BASIC METABOLIC PANEL
Calcium: 8.9 mg/dL (ref 8.4–10.5)
GFR calc Af Amer: 70 mL/min — ABNORMAL LOW (ref >90)
GFR calc non Af Amer: 60 mL/min — ABNORMAL LOW (ref >90)
Glucose, Bld: 184 mg/dL — ABNORMAL HIGH (ref 70–99)
Potassium: 4.2 mEq/L (ref 3.5–5.1)
Sodium: 139 mEq/L (ref 135–145)

## 2012-11-25 LAB — GLUCOSE, CAPILLARY
Glucose-Capillary: 180 mg/dL — ABNORMAL HIGH (ref 70–99)
Glucose-Capillary: 154 mg/dL — ABNORMAL HIGH (ref 70–99)
Glucose-Capillary: 303 mg/dL — ABNORMAL HIGH (ref 70–99)
Glucose-Capillary: 112 mg/dL — ABNORMAL HIGH (ref 70–99)

## 2012-11-25 LAB — HEMOGLOBIN A1C: Mean Plasma Glucose: 120 mg/dL — ABNORMAL HIGH (ref <117)

## 2012-11-25 MED ORDER — DEXTROSE-NACL 5-0.9 % IV SOLN
INTRAVENOUS | Status: AC
  Administered 2012-11-25: 14:00:00 1000 mL via INTRAVENOUS

## 2012-11-25 NOTE — Progress Notes (Signed)
CSW met with pt this am to assist with d/c planning. Pt has chosen [facility] for [facility] following hospital d/c. SNF will have bed when pt is stable for d/c. CSW will follow to assist with d/c planning to SNF.  Cori Razor LCSW [phone]

## 2012-11-25 NOTE — Evaluation (Signed)
Occupational Therapy Evaluation Patient Details Name: Sophia Rodriguez MRN: [id number] DOB: 1950-11-29 Today's Date: 11/25/2012 Time: 7829-5621 OT Time Calculation (min): 36 min  OT Assessment / Plan / Recommendation History of present illness Sophia Rodriguez is an 62 y.o. female with hx of recent CVA undergone rehab, HTN, DM2, presents to the ER with a fall at home tonight.  She did not loose consciousness, but rather slipped and fell.  Evaluation in the ER included an Xray which showed a femoral neck fracture.  Pt underwent Rt. posterior approach THA   Clinical Impression   Pt admitted with above.  She presents to OT with below listed deficits and will benefit from OT to address deficits and allow pt to return home at modified independent level after SNF level reahb    OT Assessment  Patient needs continued OT Services    Follow Up Recommendations  SNF    Barriers to Discharge Decreased caregiver support    Equipment Recommendations  None recommended by OT    Recommendations for Other Services    Frequency  Min 2X/week    Precautions / Restrictions Precautions Precautions: Posterior Hip;Fall Precaution Comments: reviewed THA precautions Restrictions Weight Bearing Restrictions: Yes RLE Weight Bearing: Weight bearing as tolerated   Pertinent Vitals/Pain     ADL  Eating/Feeding: Independent Where Assessed - Eating/Feeding: Chair Grooming: Wash/dry hands;Wash/dry face;Teeth care;Brushing hair;Set up Where Assessed - Grooming: Supported sitting Upper Body Bathing: Set up;Supervision/safety Where Assessed - Upper Body Bathing: Supported sitting Lower Body Bathing: Moderate assistance Where Assessed - Lower Body Bathing: Supported sit to stand Upper Body Dressing: Supervision/safety Where Assessed - Upper Body Dressing: Unsupported sitting Lower Body Dressing: Maximal assistance Where Assessed - Lower Body Dressing: Supported sit to stand Toilet Transfer: +2 Total  assistance Toilet Transfer: Patient Percentage: 50% Toilet Transfer Method: Stand pivot;Sit to Barista: Bedside commode Toileting - Clothing Manipulation and Hygiene: +1 Total assistance Where Assessed - Engineer, mining and Hygiene: Standing Equipment Used: Rolling walker Transfers/Ambulation Related to ADLs: Stand pivot transfers Total A +2 (pt ~50%) ADL Comments: Pt instructed in THA precautions (post).  Pt limited by pain.      OT Diagnosis: Generalized weakness;Acute pain  OT Problem List: Decreased strength;Decreased activity tolerance;Decreased safety awareness;Decreased knowledge of use of DME or AE;Decreased knowledge of precautions;Pain OT Treatment Interventions: Self-care/ADL training;DME and/or AE instruction;Therapeutic activities;Patient/family education;Balance training   OT Goals(Current goals can be found in the care plan section) Acute Rehab OT Goals Patient Stated Goal: To get better to go home OT Goal Formulation: With patient Time For Goal Achievement: 12/02/12 Potential to Achieve Goals: Good ADL Goals Pt Will Perform Grooming: with min assist;standing Pt Will Perform Lower Body Bathing: with min assist;with adaptive equipment;sit to/from stand Pt Will Perform Lower Body Dressing: with min assist;with adaptive equipment;sit to/from stand Pt Will Transfer to Toilet: with min assist;bedside commode;ambulating Pt Will Perform Toileting - Clothing Manipulation and hygiene: with min assist;sit to/from stand Additional ADL Goal #1: Pt will be independent with post THA precautions during all functional activities   Visit Information  Last OT Received On: 11/25/12 Assistance Needed: +2 PT/OT Co-Evaluation/Treatment: Yes History of Present Illness: Sophia Rodriguez is an 62 y.o. female with hx of recent CVA undergone rehab, HTN, DM2, presents to the ER with a fall at home tonight.  She did not loose consciousness, but rather slipped  and fell.  Evaluation in the ER included an Xray which showed a femoral neck fracture.  Pt underwent Rt. posterior  approach THA       Prior Functioning     Home Living Family/patient expects to be discharged to:: Skilled nursing facility Living Arrangements: Alone Additional Comments: Used a walker at home after d/c from CIR in September 10, 2012.  She reports that she did not feel safe at home by herself and sometimes her foot would get caught on the carpet. Prior Function Level of Independence: Independent with assistive device(s) Comments: discharged at Mod I level 6/20 from CIR. Daughter stated unable to cook and maintain home which daughter provided intermittent assist 2 to 3 hrs per day Communication Communication: Other (comment);Expressive difficulties Dominant Hand: Right         Vision/Perception     Cognition  Cognition Arousal/Alertness: Awake/alert Behavior During Therapy: WFL for tasks assessed/performed Overall Cognitive Status: Within Functional Limits for tasks assessed    Extremity/Trunk Assessment Upper Extremity Assessment Upper Extremity Assessment: Generalized weakness Lower Extremity Assessment Lower Extremity Assessment: Defer to PT evaluation Cervical / Trunk Assessment Cervical / Trunk Assessment: Normal     Mobility Bed Mobility Bed Mobility: Supine to Sit;Sitting - Scoot to Edge of Bed Supine to Sit: 1: +2 Total assist Supine to Sit: Patient Percentage: 20% Sitting - Scoot to Edge of Bed: 1: +2 Total assist Sitting - Scoot to Edge of Bed: Patient Percentage: 20% Details for Bed Mobility Assistance: Requires verbal cues and assist for all aspects Transfers Transfers: Sit to Stand;Stand to Sit Sit to Stand: 1: +2 Total assist;With upper extremity assist;From bed;From chair/3-in-1 Sit to Stand: Patient Percentage: 50% Stand to Sit: 1: +2 Total assist;With upper extremity assist;To chair/3-in-1 Stand to Sit: Patient Percentage: 50% Details for  Transfer Assistance: Verbal cues for hand placement.  Requires assist to move into standing and assist to advance Rt. LE during pivot transfer.  Pt limited by pain      Exercise     Balance     End of Session OT - End of Session Equipment Utilized During Treatment: Rolling walker Activity Tolerance: Patient tolerated treatment well Patient left: in chair;with call bell/phone within reach Nurse Communication: Mobility status;Patient requests pain meds  GO     Shyann Hefner, Ursula Alert M 11/25/2012, 12:38 PM

## 2012-11-25 NOTE — Progress Notes (Signed)
Physical Therapy Treatment Patient Details Name: Shataya Winkles MRN: [id number] DOB: 02-21-1951 Today's Date: 11/25/2012 Time: 8295-6213 PT Time Calculation (min): 18 min  PT Assessment / Plan / Recommendation  History of Present Illness Shakya Sebring is an 62 y.o. female with hx of recent CVA undergone rehab, HTN, DM2, presents to the ER with a fall at home 11/24/12/  She did not loose consciousness, but rather slipped and fell.  Evaluation in the ER included an Xray which showed a femoral neck fracture.  Pt underwent Rt. posterior approach hemiarthroplasty   PT Comments   Pt assisted to [facility] prior to return to bed. Pt requires 2 person assist. Has decreased weight tolerated on RLE. Cues for posterior hip precautions  And safe positioning in bed. Reapplied KI in bed..   Follow Up Recommendations  SNF     Does the patient have the potential to tolerate intense rehabilitation     Barriers to Discharge        Equipment Recommendations  None recommended by PT    Recommendations for Other Services    Frequency Min 3X/week   Progress towards PT Goals Progress towards PT goals: Progressing toward goals  Plan Current plan remains appropriate    Precautions / Restrictions Precautions Precautions: Posterior Hip;Fall Precaution Comments: reviewed THA precautions Restrictions RLE Weight Bearing: Weight bearing as tolerated   Pertinent Vitals/Pain Reports no pain R hip except when standing.    Mobility  Bed Mobility Bed Mobility: Sit to Supine Sit to Supine: 1: +2 Total assist Details for Bed Mobility Assistance: Pt required assistance to place legs onto bed and lower trunk to bed. Cues for posterior hip precautions. Transfers Sit to Stand: 1: +2 Total assist;With upper extremity assist;From chair/3-in-1;With armrests Stand to Sit: 1: +2 Total assist;With upper extremity assist;To chair/3-in-1;To bed Details for Transfer Assistance: Verbal cues for hand placement.  Requires assist to  move into standing> Support provided through         R axilla to allow some weight on RLE to take a small step with L. Pt able to bear weight through UE's on RW. Ambulation/Gait Ambulation/Gait Assistance: Not tested (comment)    Exercises     PT Diagnosis:    PT Problem List:   PT Treatment Interventions:     PT Goals (current goals can now be found in the care plan section)    Visit Information  Last PT Received On: 11/25/12 History of Present Illness: Minervia Osso is an 62 y.o. female with hx of recent CVA undergone rehab, HTN, DM2, presents to the ER with a fall at home 11/24/12/  She did not loose consciousness, but rather slipped and fell.  Evaluation in the ER included an Xray which showed a femoral neck fracture.  Pt underwent Rt. posterior approach hemiarthroplasty    Subjective Data      Cognition  Cognition Arousal/Alertness: Awake/alert    Balance     End of Session PT - End of Session Activity Tolerance: Patient tolerated treatment well Patient left: in bed;with call bell/phone within reach Nurse Communication: Mobility status   GP     [name] 11/25/2012, 5:03 PM

## 2012-11-25 NOTE — Progress Notes (Signed)
   Subjective: 1 Day Post-Op Procedure(s) (LRB): RIGHT HIP HEMIARTHROPLASTY (Right)  Pt feeling better today Mild pain to right hip Patient reports pain as mild.  Objective:   VITALS:   Filed Vitals:   11/25/12 0500  BP: 108/70  Pulse:   Temp: 97.7 F (36.5 C)  Resp: 16    Right hip dressing intact nv intact distally No rashes or edema  LABS  Recent Labs  11/24/12 0140 11/25/12 0504  HGB 12.2 10.0*  HCT 37.0 31.0*  WBC 12.7* 8.9  PLT 211 176     Recent Labs  11/24/12 0140 11/25/12 0504  NA 137 139  K 3.7 4.2  BUN 16 13  CREATININE 0.97 0.99  GLUCOSE 206* 184*     Assessment/Plan: 1 Day Post-Op Procedure(s) (LRB): RIGHT HIP HEMIARTHROPLASTY (Right)  PT/OT dvt prophylaxis Pain control as needed D/c planning   Brad Malosi Hemstreet, MPAS, PA-C  11/25/2012, 8:02 AM

## 2012-11-25 NOTE — Op Note (Signed)
NAMEJAEDAH, LORDS NO.:  [id number]  MEDICAL RECORD NO.:  [id number]  LOCATION:  1620                         FACILITY:  [facility]  PHYSICIAN:  Almedia Balls. Ranell Patrick, M.D. DATE OF BIRTH:  1950-09-22  DATE OF PROCEDURE:  11/24/2012 DATE OF DISCHARGE:                              OPERATIVE REPORT   PREOPERATIVE DIAGNOSIS:  Right hip unstable femoral neck fracture.  POSTOPERATIVE DIAGNOSIS:  Right hip unstable femoral neck fracture.  PROCEDURE PERFORMED:  Right hip hemiarthroplasty using DePuy Tri-Lock system.  ATTENDING SURGEON:  Almedia Balls. Ranell Patrick, M.D.  ASSISTANT:  Arsenio Loader, PA-C.  ANESTHESIA:  General anesthesia was used.  ESTIMATED BLOOD LOSS:  About 150 mL.  FLUID REPLACEMENT:  1500 mL of crystalloid.  INSTRUMENT COUNTS:  Correct.  COMPLICATIONS:  There were no complications.  ANTIBIOTICS:  Perioperative antibiotics were given.  INDICATIONS:  The patient is a 62 year old female who suffered ground level fall injuring her right hip.  The patient unable to ambulate after the fall.  The patient has an unstable femoral neck fracture clinically and radiographically.  The patient presents for operative treatment including hemiarthroplasty to restore proximal femoral anatomy. Informed consent was obtained.  DESCRIPTION OF PROCEDURE:  After adequate level of anesthesia was achieved, the patient was positioned in the left lateral decubitus position with the right hip up.  Right hip sterilely prepped and draped. Time-out called.  Down leg was padded appropriately.  Axillary roll was utilized.  We used a posterior Kocher-Langenbeck approach.  Dissection through subcutaneous tissues.  Tensor fascia lata divided in line with skin incision.  Short external rotators taken sharply off the posterior femur, femoral neck fracture, identified this as a fresh fracture, no pathologic features noted.  We then performed a neck cut 1 fingerbreadth above the lesser  trochanter using neck resection guide and oscillating saw.  We then went ahead and removed the femoral head and sized it to a 47 head.  We inspected the acetabulum and removed excess bone fragments. Acetabulum was in good shape.  We then broached up to a size 6 Tri-Lock stem.  We impacted the trial stem in place, and then reduced the hip with a +0 neck adapter and a 47 head.  We were pleased with that soft tissue balancing.  I felt like we could probably go with thea +5 depending on how deep our Tri-Lock is seated.  We next removed our trial components, thoroughly irrigated, and then impacted the real size 6 Tri- Lock stem with appropriate anteversion in the stem.  We had no wiggle and nice fit.  We went ahead and selected the +5 neck adapter and 47 monopolar head, impacted that in place, reduced the hip, repaired short external rotators, the gluteus medius.  We then repaired the tensor fascia lata in layered closure.  After irrigating, we used interrupted #1 Vicryl figure-of-eight sutures for the tendinous portion and then ran the fascia more proximally.  Subcutaneous closure with 2-0 Vicryl with buried knots and then staples for the skin.  Sterile bandage placed. The patient was taken to the supine position.  A knee immobilizer placed and then the patient was extubated in stable condition.     Viviann Spare  Russ Halo, M.D.     SRN/MEDQ  D:  11/24/2012  T:  11/25/2012  Job:  [id number]

## 2012-11-25 NOTE — Evaluation (Addendum)
Physical Therapy Evaluation Patient Details Name: Sophia Rodriguez MRN: [id number] DOB: Oct 20, 1950 Today's Date: 11/25/2012 Time: 4098-1191 PT Time Calculation (min): 36 min  PT Assessment / Plan / Recommendation History of Present Illness  Sophia Rodriguez is an 62 y.o. female with hx of recent CVA undergone rehab, HTN, DM2, presents to the ER with a fall at home 11/24/12.  She did not loose consciousness, but rather slipped and fell.  Evaluation in the ER included an Xray which showed a femoral neck fracture.  Pt underwent Rt. posterior approach hemiarthroplasty  Clinical Impression  Pt tolerated mobilizing to [facility] and recliner with 2 person's. Pt will benefit from PT while in acute for safe transfers, Instruction of Posterior precautions, and strengthening.Marland Kitchen Pt plans SNF at DC.     PT Assessment  Patient needs continued PT services    Follow Up Recommendations  SNF    Does the patient have the potential to tolerate intense rehabilitation      Barriers to Discharge Decreased caregiver support      Equipment Recommendations  None recommended by PT    Recommendations for Other Services     Frequency Min 3X/week    Precautions / Restrictions Precautions Precautions: Posterior Hip;Fall Precaution Comments: reviewed THA precautions Restrictions Weight Bearing Restrictions: Yes RLE Weight Bearing: Weight bearing as tolerated   Pertinent Vitals/Pain 7-8 for R hip, RN notified.      Mobility  Bed Mobility Bed Mobility: Supine to Sit;Sitting - Scoot to Edge of Bed Supine to Sit: 1: +2 Total assist Supine to Sit: Patient Percentage: 20% Sitting - Scoot to Edge of Bed: 1: +2 Total assist Sitting - Scoot to Edge of Bed: Patient Percentage: 20% Details for Bed Mobility Assistance: Requires verbal cues and assist for all aspects Transfers Sit to Stand: 1: +2 Total assist;With upper extremity assist;From bed;From chair/3-in-1 Sit to Stand: Patient Percentage: 50% Stand to Sit: 1: +2  Total assist;With upper extremity assist;To chair/3-in-1 Stand to Sit: Patient Percentage: 50% Details for Transfer Assistance: Verbal cues for hand placement.  Requires assist to move into standing and assist to advance Rt. LE during pivot transfer.  Pt limited by pain     Exercises     PT Diagnosis: Difficulty walking;Acute pain;Hemiplegia dominant side  PT Problem List: Decreased strength;Decreased range of motion;Decreased activity tolerance;Decreased mobility;Decreased knowledge of use of DME;Decreased safety awareness;Decreased knowledge of precautions;Impaired tone;Pain PT Treatment Interventions: DME instruction;Gait training;Functional mobility training;Therapeutic activities;Therapeutic exercise;Patient/family education     PT Goals(Current goals can be found in the care plan section) Acute Rehab PT Goals Patient Stated Goal: To get better to go home PT Goal Formulation: With patient Time For Goal Achievement: 12/09/12 Potential to Achieve Goals: Good  Visit Information  Last PT Received On: 11/25/12 Assistance Needed: +2 History of Present Illness: Sophia Rodriguez is an 62 y.o. female with hx of recent CVA undergone rehab, HTN, DM2, presents to the ER with a fall at home tonight.  She did not loose consciousness, but rather slipped and fell.  Evaluation in the ER included an Xray which showed a femoral neck fracture.  Pt underwent Rt. posterior approach hemiarthroplasty       Prior Functioning  Home Living Family/patient expects to be discharged to:: Skilled nursing facility Living Arrangements: Alone Additional Comments: Used a walker at home after d/c from CIR in September 10, 2012.  She reports that she did not feel safe at home by herself and sometimes her foot would get caught on the carpet. Prior Function  Level of Independence: Independent with assistive device(s) Comments: discharged at Mod I level 6/20 from CIR. Daughter stated unable to cook and maintain home which  daughter provided intermittent assist 2 to 3 hrs per day Communication Communication: Other (comment);Expressive difficulties Dominant Hand: Right    Cognition  Cognition Arousal/Alertness: Awake/alert Behavior During Therapy: WFL for tasks assessed/performed Overall Cognitive Status: Within Functional Limits for tasks assessed    Extremity/Trunk Assessment Upper Extremity Assessment Upper Extremity Assessment: Generalized weakness Lower Extremity Assessment Lower Extremity Assessment: RLE deficits/detail RLE Deficits / Details: pt requires assistance to move RLE to edge of bed, also to advance LE for pivots. Cervical / Trunk Assessment Cervical / Trunk Assessment: Normal   Balance Balance Balance Assessed: Yes Static Sitting Balance Static Sitting - Balance Support: Bilateral upper extremity supported;Feet supported Static Sitting - Level of Assistance: 5: Stand by assistance  End of Session PT - End of Session Equipment Utilized During Treatment: Gait belt Activity Tolerance: Patient limited by pain;Patient limited by fatigue Patient left: in chair;with call bell/phone within reach Nurse Communication: Mobility status, pain meds.  GP     [name] 11/25/2012, 1:03 PM Blanchard Kelch PT [phone]

## 2012-11-25 NOTE — Progress Notes (Signed)
Patient ID: Titania Gault, female   DOB: 27-Apr-1950, 62 y.o.   MRN: [id number]  [facility] PROGRESS NOTE  Deah Ottaway WUJ:[id number] DOB: [birth year] DOA: 11/24/2012 PCP: Dorrene German, MD  Brief narrative: 62 y.o. female with hx of recent CVA undergone rehab, HTN, DM2, presented to the ER with a fall at home several hours prior to this admission. She did not loose consciousness, but rather slipped and fell. Evaluation in the ER included an Xray which showed a femoral neck fracture. She also has an EKG showing NSR with no acute ST-T changes. Her CXR showed no infiltrate, nor edema. She has normal Hb, normal electrolytes, and normal renal fx tests. She was seen in consultation with Dr Malon Kindle of orthopedics for planned ORIF. [facility] asked to admit for further management.   Principal Problem:   Closed fracture of right hip - status post right hip hemiarthroplasty, post op day #1 - pt doing well and hemodynamically stable - PT evaluation, ambulate as pt able to tolerate - SNF in progress - continue analgesia as needed  Active Problems:   Hypertension - reasonable inpatient control    Leukocytosis - secondary to stress reaction, fall - now resolved and WBC within normal limits - no signs of an infectious etiology    DM type 2  - reasonable inpatient control, A1C 08/2102 = 5.9 - will repeat A1C and will continue SSI, sensitive coverage     Anemia of chronic disease - slight drop in Hg since admission, possibly dilutional adn some blood loss ost op - overall stable and no signs of an active bleed - CBC in AM, continue iron supplementation    Thrombotic cerebral infarction - stable, continue Aspirin and Statin    Consultants:  Ortho  Procedures/Studies: Dg Chest 2 View  11/24/2012  Low lung volumes without edema or definite infiltrate.    Dg Hip Complete Right  11/24/2012  Linear lucency with adjacent sclerosis in the medial right femoral neck is suspicious for a non-displaced  acute fracture.    Dg Pelvis Portable  11/24/2012  Right proximal femoral arthroplasty with no adverse features identified.     Right hip hemiarthroplasty 11/24/2012  Antibiotics:  None  Code Status: Full Family Communication: Pt at bedside Disposition Plan: SNF placement in progress   HPI/Subjective: No events overnight.   Objective: Filed Vitals:   11/24/12 2355 11/25/12 0108 11/25/12 0354 11/25/12 0500  BP: 114/74 115/75 116/73 108/70  Pulse:      Temp: 99.5 F (37.5 C) 97.7 F (36.5 C) 97.8 F (36.6 C) 97.7 F (36.5 C)  TempSrc: Oral Oral Oral Oral  Resp: 18 18 16 16   Weight:      SpO2: 100% 100% 99% 98%    Intake/Output Summary (Last 24 hours) at 11/25/12 0923 Last data filed at 11/25/12 0858  Gross per 24 hour  Intake 2584.17 ml  Output   1170 ml  Net 1414.17 ml    Exam:   General:  Pt is alert, follows commands appropriately, not in acute distress  Cardiovascular: Regular rate and rhythm, S1/S2, no murmurs, no rubs, no gallops  Respiratory: Clear to auscultation bilaterally, no wheezing, no crackles, no rhonchi  Abdomen: Soft, non tender, non distended, bowel sounds present, no guarding  Extremities: No edema, pulses DP and PT palpable bilaterally  Neuro: Grossly nonfocal  Data Reviewed: Basic Metabolic Panel:  Recent Labs Lab 11/24/12 0140 11/25/12 0504  NA 137 139  K 3.7 4.2  CL 103 105  CO2 27  27  GLUCOSE 206* 184*  BUN 16 13  CREATININE 0.97 0.99  CALCIUM 9.8 8.9   CBC:  Recent Labs Lab 11/24/12 0140 11/25/12 0504  WBC 12.7* 8.9  HGB 12.2 10.0*  HCT 37.0 31.0*  MCV 81.0 82.2  PLT 211 176   CBG:  Recent Labs Lab 11/24/12 1957 11/24/12 2130 11/25/12 0012 11/25/12 0345 11/25/12 0711  GLUCAP 134* 156* 180* 169* 154*    Recent Results (from the past 240 hour(s))  SURGICAL PCR SCREEN     Status: None   Collection Time    11/24/12  4:12 AM      Result Value Range Status   MRSA, PCR NEGATIVE  NEGATIVE Final    Staphylococcus aureus NEGATIVE  NEGATIVE Final   Comment:            The Xpert SA Assay (FDA     approved for NASAL specimens     in patients over 71 years of age),     is one component of     a comprehensive surveillance     program.  Test performance has     been validated by [employer] for patients greater     than or equal to 8 year old.     It is not intended     to diagnose infection nor to     guide or monitor treatment.     Scheduled Meds: . aspirin  325 mg Oral Daily  . atorvastatin  40 mg Oral q1800  . citalopram  10 mg Oral Daily  . enoxaparin injection  40 mg Subcutaneous Q24H  . ferrous sulfate  325 mg Oral TID PC  . insulin aspart  0-9 Units Subcutaneous Q4H   Continuous Infusions: . sodium chloride 50 mL/hr at 11/24/12 2217  . dextrose 5 % and 0.9% NaCl 50 mL/hr at 11/24/12 0436   Debbora Presto, MD  [facility] Pager [phone]  If 7PM-7AM, please contact night-coverage [email or web address] Password [email or web address] 11/25/2012, 9:23 AM   LOS: 1 day

## 2012-11-26 LAB — CBC
HCT: 28.1 % — ABNORMAL LOW (ref 36.0–46.0)
MCHC: 32.7 g/dL (ref 30.0–36.0)
MCV: 81.9 fL (ref 78.0–100.0)
Platelets: 151 10*3/uL (ref 150–400)
RDW: 14.3 % (ref 11.5–15.5)
WBC: 6.1 10*3/uL (ref 4.0–10.5)

## 2012-11-26 LAB — GLUCOSE, CAPILLARY
Glucose-Capillary: 158 mg/dL — ABNORMAL HIGH (ref 70–99)
Glucose-Capillary: 140 mg/dL — ABNORMAL HIGH (ref 70–99)

## 2012-11-26 LAB — BASIC METABOLIC PANEL
BUN: 17 mg/dL (ref 6–23)
Creatinine, Ser: 1.12 mg/dL — ABNORMAL HIGH (ref 0.50–1.10)
GFR calc Af Amer: 60 mL/min — ABNORMAL LOW (ref >90)
GFR calc non Af Amer: 52 mL/min — ABNORMAL LOW (ref >90)
Potassium: 3.2 mEq/L — ABNORMAL LOW (ref 3.5–5.1)

## 2012-11-26 MED ORDER — ZOLPIDEM TARTRATE 5 MG PO TABS: 5.0000 mg | ORAL_TABLET | Freq: Every evening | ORAL | Status: DC | PRN

## 2012-11-26 MED ORDER — HYDROCODONE-ACETAMINOPHEN 5-325 MG PO TABS: 1.0000 | ORAL_TABLET | Freq: Four times a day (QID) | ORAL | Status: DC | PRN

## 2012-11-26 MED ORDER — CLONAZEPAM 0.5 MG PO TABS: 0.5000 mg | ORAL_TABLET | Freq: Every day | ORAL | Status: DC | PRN

## 2012-11-26 MED ORDER — POTASSIUM CHLORIDE CRYS ER 20 MEQ PO TBCR
40.0000 meq | EXTENDED_RELEASE_TABLET | Freq: Once | ORAL | Status: AC
  Administered 2012-11-26: 40 meq via ORAL
  Filled 2012-11-26: qty 2

## 2012-11-26 MED ORDER — ENOXAPARIN SODIUM 40 MG/0.4ML ~~LOC~~ SOLN: 40.0000 mg | SUBCUTANEOUS | Status: DC

## 2012-11-26 MED ORDER — FERROUS SULFATE 325 (65 FE) MG PO TABS: 325.0000 mg | ORAL_TABLET | Freq: Three times a day (TID) | ORAL | Status: DC

## 2012-11-26 MED ORDER — ENSURE COMPLETE PO LIQD
237.0000 mL | Freq: Two times a day (BID) | ORAL | Status: DC
  Administered 2012-11-26: 237 mL via ORAL

## 2012-11-26 NOTE — Progress Notes (Signed)
Physical Therapy Treatment Patient Details Name: Sophia Rodriguez MRN: [id number] DOB: 19-Jul-1950 Today's Date: 11/26/2012 Time: 4098-1191 PT Time Calculation (min): 21 min  PT Assessment / Plan / Recommendation  History of Present Illness     PT Comments   Pt much improved with standing but has difficulty advancing R leg. Max assistance placed under axilla to take weight to advance R leg also, pt's standing at RW presents with decreased weight through RUE.  Follow Up Recommendations  SNF     Does the patient have the potential to tolerate intense rehabilitation     Barriers to Discharge        Equipment Recommendations  None recommended by PT    Recommendations for Other Services    Frequency Min 3X/week   Progress towards PT Goals Progress towards PT goals: Progressing toward goals  Plan Current plan remains appropriate    Precautions / Restrictions Precautions Precautions: Posterior Hip;Fall   Pertinent Vitals/Pain none    Mobility  Bed Mobility Sit to Supine: 1: +2 Total assist Details for Bed Mobility Assistance: Pt required assistance to place legs onto bed and lower trunk to bed. Cues for posterior hip precautions.    Exercises Total Joint Exercises Ankle Circles/Pumps: AAROM;Right;10 reps;Supine Short Arc Quad: AROM;Right;10 reps;Supine Heel Slides: AAROM;Right;10 reps;Supine Hip ABduction/ADduction: AAROM;Right;10 reps;Supine   PT Diagnosis:    PT Problem List:   PT Treatment Interventions:     PT Goals (current goals can now be found in the care plan section)    Visit Information  Last PT Received On: 11/26/12 Assistance Needed: +2    Subjective Data      Cognition  Cognition Arousal/Alertness: Awake/alert    Balance     End of Session PT - End of Session Activity Tolerance: Patient tolerated treatment well Patient left: in bed;with call bell/phone within reach Nurse Communication: Mobility status   GP     [name] 11/26/2012,  3:45 PM

## 2012-11-26 NOTE — Progress Notes (Signed)
Orthopedics Progress Note  Subjective: I feel better today  Objective:  Filed Vitals:   11/26/12 0639  BP: 122/78  Pulse:   Temp: 98.9 F (37.2 C)  Resp: 16    General: Awake and alert  Musculoskeletal: right hip dressing CDI, able to move foot and wiggle toes Neurovascularly intact  Lab Results  Component Value Date   WBC 6.1 11/26/2012   HGB 9.2* 11/26/2012   HCT 28.1* 11/26/2012   MCV 81.9 11/26/2012   PLT 151 11/26/2012       Component Value Date/Time   NA 138 11/26/2012 0433   K 3.2* 11/26/2012 0433   CL 105 11/26/2012 0433   CO2 23 11/26/2012 0433   GLUCOSE 182* 11/26/2012 0433   BUN 17 11/26/2012 0433   CREATININE 1.12* 11/26/2012 0433   CALCIUM 9.0 11/26/2012 0433   GFRNONAA 52* 11/26/2012 0433   GFRAA 60* 11/26/2012 0433    Lab Results  Component Value Date   INR 1.11 11/24/2012   INR 1.06 08/26/2012   INR 1.09 11/28/2009    Assessment/Plan: POD #2 s/p Procedure(s): RIGHT HIP HEMIARTHROPLASTY Acute blood loss anemia, currently assymptomatic..will watch Hypokalemia- per medicine Patient stable for D/C to SNF Rehab when bed avail WBAT Lovenox for 30 days  Follow up with me in two weeks in the office  [phone] Viviann Spare R. Ranell Patrick, MD 11/26/2012 7:02 AM

## 2012-11-26 NOTE — Discharge Summary (Addendum)
Physician Discharge Summary  Sophia Rodriguez ZOX:[id number] DOB: 1950/05/16 DOA: 11/24/2012  PCP: Dorrene German, MD  Admit date: 11/24/2012 Discharge date: 11/27/2012  Recommendations for Outpatient Follow-up:  1. Pt will need to follow up with PCP in 2-3 weeks post discharge 2. Please obtain BMP to evaluate electrolytes and kidney function, potassium level 3. Please note that pt was given K-dur 40 MEQ prior to discharge so supplement if indicted based on electrolyte panel results  4. Pt was discharged on Lovenox for DVT prophylaxis per ortho recommendations and needs to follow up with Dr. Ranell Patrick orthopedic specialist, in 2 weeks, please see instructions below  5. Note that the appointment with Dr. Ranell Patrick needs to be scheduled so please ensure that is follow up  6. Please also check CBC to evaluate Hg and Hct levels, pt is to continue taking iron supplementation  7. Please note that pt was also advised to drink fluids and repeating kidney function tests is important to ensure that Cr is stable and within normal limits   Discharge Diagnoses:  Principal Problem:   Closed fracture of right hip Active Problems:   Hypertension   DM type 2 causing neurological disease   Thrombotic cerebral infarction  Discharge Condition: Stable  Diet recommendation: Heart healthy diet discussed in details   Brief narrative:  62 y.o. female with hx of recent CVA undergone rehab, HTN, DM2, presented to the ER with a fall at home several hours prior to this admission. She did not loose consciousness, but rather slipped and fell. Evaluation in the ER included an Xray which showed a femoral neck fracture. She also has an EKG showing NSR with no acute ST-T changes. Her CXR showed no infiltrate, nor edema. She has normal Hb, normal electrolytes, and normal renal fx tests. She was seen in consultation with Dr Malon Kindle of orthopedics for planned ORIF. [facility] asked to admit for further management.   Principal Problem:   Closed fracture of right hip  - status post right hip hemiarthroplasty, post op day #2 - pt doing well and hemodynamically stable  - PT evaluation done  - SNF upon discharge and follow up with Dr. Ranell Patrick in 2 weeks, appointment needs to be scheduled  - continue analgesia as needed  Active Problems:  Hypertension  - reasonable inpatient control  Leukocytosis  - secondary to stress reaction, fall  - now resolved and WBC within normal limits  - no signs of an infectious etiology  DM type 2  - reasonable inpatient control, A1C 11/2012 = 5.8 - well controlled  Anemia of chronic disease  - slight drop in Hg since admission, possibly dilutional and some blood loss post op  - overall stable and no signs of an active bleed  - continue iron supplementation  Thrombotic cerebral infarction  - stable, continue Aspirin and Statin   Consultants:  Ortho Procedures/Studies:  Dg Chest 2 View 11/24/2012 Low lung volumes without edema or definite infiltrate.  Dg Hip Complete Right 11/24/2012 Linear lucency with adjacent sclerosis in the medial right femoral neck is suspicious for a non-displaced acute fracture.  Dg Pelvis Portable 11/24/2012 Right proximal femoral arthroplasty with no adverse features identified.  Right hip hemiarthroplasty 11/24/2012 Antibiotics:  None  Code Status: Full  Family Communication: Pt at bedside  Disposition Plan: SNF 11/27/2012   Discharge Exam: Filed Vitals:   11/26/12 0639  BP: 122/78  Pulse:   Temp: 98.9 F (37.2 C)  Resp: 16   Filed Vitals:   11/25/12 1200 11/25/12  1432 11/25/12 2117 11/26/12 0639  BP:  122/70 114/76 122/78  Pulse:      Temp:  98.2 F (36.8 C) 100.1 F (37.8 C) 98.9 F (37.2 C)  TempSrc:  Oral Oral Oral  Resp: 16 16 16 16   Weight:      SpO2: 100% 100% 94% 96%    General: Pt is alert, follows commands appropriately, not in acute distress Cardiovascular: Regular rate and rhythm, S1/S2 +, no murmurs, no rubs, no gallops Respiratory:  Clear to auscultation bilaterally, no wheezing, no crackles, no rhonchi Abdominal: Soft, non tender, non distended, bowel sounds +, no guarding Extremities: no edema, no cyanosis, pulses palpable bilaterally DP and PT Neuro: Grossly nonfocal  Discharge Instructions  Discharge Orders   Future Orders Complete By Expires   Diet - low sodium heart healthy  As directed    Increase activity slowly  As directed    Weight bearing as tolerated  As directed    Comments:     Right LE       Medication List         aspirin 325 MG tablet  Take 1 tablet (325 mg total) by mouth daily.     atorvastatin 40 MG tablet  Commonly known as:  LIPITOR  Take 1 tablet (40 mg total) by mouth daily at 6 PM.     citalopram 10 MG tablet  Commonly known as:  CELEXA  Take 1 tablet (10 mg total) by mouth daily.     clonazePAM 0.5 MG tablet  Commonly known as:  KLONOPIN  Take 1 tablet (0.5 mg total) by mouth daily as needed for anxiety.     enoxaparin 40 MG/0.4ML injection  Commonly known as:  LOVENOX  Inject 0.4 mLs (40 mg total) into the skin daily.     ferrous sulfate 325 (65 FE) MG tablet  Take 1 tablet (325 mg total) by mouth 3 (three) times daily after meals.     HYDROcodone-acetaminophen 5-325 MG per tablet  Commonly known as:  NORCO/VICODIN  Take 1-2 tablets by mouth every 6 (six) hours as needed.     multivitamin with minerals Tabs tablet  Take 1 tablet by mouth daily.     zolpidem 5 MG tablet  Commonly known as:  AMBIEN  Take 1 tablet (5 mg total) by mouth at bedtime as needed for sleep.           Follow-up Information   Follow up with NORRIS,STEVEN R, MD. Call in 2 weeks. [phone])    Specialty:  Orthopedic Surgery   Contact information:   [address] [place] [address] [phone]       Follow up with Dorrene German, MD In 3 weeks.   Specialty:  Internal Medicine   Contact information:   [address] [place] [address]        The  results of significant diagnostics from this hospitalization (including imaging, microbiology, ancillary and laboratory) are listed below for reference.     Microbiology: Recent Results (from the past 240 hour(s))  SURGICAL PCR SCREEN     Status: None   Collection Time    11/24/12  4:12 AM      Result Value Range Status   MRSA, PCR NEGATIVE  NEGATIVE Final   Staphylococcus aureus NEGATIVE  NEGATIVE Final   Comment:            The Xpert SA Assay (FDA     approved for NASAL specimens  in patients over 27 years of age),     is one component of     a comprehensive surveillance     program.  Test performance has     been validated by [employer] for patients greater     than or equal to 56 year old.     It is not intended     to diagnose infection nor to     guide or monitor treatment.     Labs: Basic Metabolic Panel:  Recent Labs Lab 11/24/12 0140 11/25/12 0504 11/26/12 0433  NA 137 139 138  K 3.7 4.2 3.2*  CL 103 105 105  CO2 27 27 23   GLUCOSE 206* 184* 182*  BUN 16 13 17   CREATININE 0.97 0.99 1.12*  CALCIUM 9.8 8.9 9.0   CBC:  Recent Labs Lab 11/24/12 0140 11/25/12 0504 11/26/12 0433  WBC 12.7* 8.9 6.1  HGB 12.2 10.0* 9.2*  HCT 37.0 31.0* 28.1*  MCV 81.0 82.2 81.9  PLT 211 176 151   CBG:  Recent Labs Lab 11/25/12 0711 11/25/12 1207 11/25/12 1701 11/25/12 2114 11/26/12 0737  GLUCAP 154* 303* 112* 130* 158*     SIGNED: Time coordinating discharge: Over 30 minutes  Debbora Presto, MD  [facility] 11/26/2012, 8:36 AM Pager [phone]  If 7PM-7AM, please contact night-coverage [email or web address] Password [email or web address]

## 2012-11-27 LAB — CBC
MCHC: 32.1 g/dL (ref 30.0–36.0)
Platelets: 148 10*3/uL — ABNORMAL LOW (ref 150–400)
RDW: 14.4 % (ref 11.5–15.5)
WBC: 6.1 10*3/uL (ref 4.0–10.5)

## 2012-11-27 NOTE — Progress Notes (Signed)
Patient ID: Sophia Rodriguez, female   DOB: [birth year], 62 y.o.   MRN: [id number]  [facility] PROGRESS NOTE  Kavina Cantave WUJ:[id number] DOB: 04-18-1950 DOA: 11/24/2012 PCP: Dorrene German, MD  Brief narrative:  62 y.o. female with hx of recent CVA undergone rehab, HTN, DM2, presented to the ER with a fall at home several hours prior to this admission. She did not loose consciousness, but rather slipped and fell. Evaluation in the ER included an Xray which showed a femoral neck fracture. She also has an EKG showing NSR with no acute ST-T changes. Her CXR showed no infiltrate, nor edema. She has normal Hb, normal electrolytes, and normal renal fx tests. She was seen in consultation with Dr Malon Kindle of orthopedics for planned ORIF. [facility] asked to admit for further management.   Principal Problem:  Closed fracture of right hip  - status post right hip hemiarthroplasty, post op day #3 - pt doing well and hemodynamically stable  - SNF upon discharge and follow up with Dr. Ranell Patrick in 2 weeks, appointment needs to be scheduled  - continue analgesia as needed  Active Problems:  Hypertension  - reasonable inpatient control  Leukocytosis  - secondary to stress reaction, fall  - now resolved and WBC within normal limits  - no signs of an infectious etiology  DM type 2  - reasonable inpatient control, A1C 11/2012 = 5.8  - well controlled  Anemia of chronic disease  - slight drop in Hg since admission, possibly dilutional and some blood loss post op  - overall stable and no signs of an active bleed  - continue iron supplementation  Thrombotic cerebral infarction  - stable, continue Aspirin and Statin   Consultants:  Ortho Procedures/Studies:  Dg Chest 2 View 11/24/2012 Low lung volumes without edema or definite infiltrate.  Dg Hip Complete Right 11/24/2012 Linear lucency with adjacent sclerosis in the medial right femoral neck is suspicious for a non-displaced acute fracture.  Dg Pelvis  Portable 11/24/2012 Right proximal femoral arthroplasty with no adverse features identified.  Right hip hemiarthroplasty 11/24/2012 Antibiotics:  None  HPI/Subjective: No events overnight.   Objective: Filed Vitals:   11/26/12 1240 11/26/12 1401 11/26/12 2130 11/27/12 0424  BP:  104/72 133/77 128/86  Pulse:   96 110  Temp:  98.5 F (36.9 C) 99.9 F (37.7 C) 99.1 F (37.3 C)  TempSrc:  Oral Oral Oral  Resp: 16 18  18   Weight:      SpO2:  94% 99% 96%    Intake/Output Summary (Last 24 hours) at 11/27/12 0850 Last data filed at 11/27/12 0426  Gross per 24 hour  Intake    240 ml  Output    250 ml  Net    -10 ml    Exam:   General:  Pt is alert, follows commands appropriately, not in acute distress  Cardiovascular: Regular rate and rhythm, S1/S2, no murmurs, no rubs, no gallops  Respiratory: Clear to auscultation bilaterally, no wheezing, no crackles, no rhonchi  Abdomen: Soft, non tender, non distended, bowel sounds present, no guarding  Extremities: No edema, pulses DP and PT palpable bilaterally  Neuro: Grossly nonfocal  Data Reviewed: Basic Metabolic Panel:  Recent Labs Lab 11/24/12 0140 11/25/12 0504 11/26/12 0433  NA 137 139 138  K 3.7 4.2 3.2*  CL 103 105 105  CO2 27 27 23   GLUCOSE 206* 184* 182*  BUN 16 13 17   CREATININE 0.97 0.99 1.12*  CALCIUM 9.8 8.9 9.0   Liver  Function Tests: No results found for this basename: AST, ALT, ALKPHOS, BILITOT, PROT, ALBUMIN,  in the last 168 hours No results found for this basename: LIPASE, AMYLASE,  in the last 168 hours No results found for this basename: AMMONIA,  in the last 168 hours CBC:  Recent Labs Lab 11/24/12 0140 11/25/12 0504 11/26/12 0433 11/27/12 0513  WBC 12.7* 8.9 6.1 6.1  HGB 12.2 10.0* 9.2* 8.9*  HCT 37.0 31.0* 28.1* 27.7*  MCV 81.0 82.2 81.9 82.4  PLT 211 176 151 148*   Cardiac Enzymes: No results found for this basename: CKTOTAL, CKMB, CKMBINDEX, TROPONINI,  in the last 168  hours BNP: No components found with this basename: POCBNP,  CBG:  Recent Labs Lab 11/26/12 0737 11/26/12 1157 11/26/12 1735 11/26/12 2116 11/27/12 0723  GLUCAP 158* 140* 132* 137* 146*    Recent Results (from the past 240 hour(s))  SURGICAL PCR SCREEN     Status: None   Collection Time    11/24/12  4:12 AM      Result Value Range Status   MRSA, PCR NEGATIVE  NEGATIVE Final   Staphylococcus aureus NEGATIVE  NEGATIVE Final   Comment:            The Xpert SA Assay (FDA     approved for NASAL specimens     in patients over 15 years of age),     is one component of     a comprehensive surveillance     program.  Test performance has     been validated by [employer] for patients greater     than or equal to 2 year old.     It is not intended     to diagnose infection nor to     guide or monitor treatment.     Scheduled Meds: . aspirin  325 mg Oral Daily  . atorvastatin  40 mg Oral q1800  . citalopram  10 mg Oral Daily  . enoxaparin (LOVENOX) injection  40 mg Subcutaneous Q24H  . feeding supplement  237 mL Oral BID BM  . ferrous sulfate  325 mg Oral TID PC  . insulin aspart  0-9 Units Subcutaneous Q4H   Continuous Infusions:    Debbora Presto, MD  [facility] Pager [phone]  If 7PM-7AM, please contact night-coverage [email or web address] Password [email or web address] 11/27/2012, 8:50 AM   LOS: 3 days

## 2012-11-27 NOTE — Plan of Care (Signed)
Problem: Discharge Progression Outcomes Goal: Discharge plan in place and appropriate Outcome: Completed/Met Date Met:  11/27/12 SNF

## 2012-11-27 NOTE — Progress Notes (Signed)
Clinical Social Work Department CLINICAL SOCIAL WORK PLACEMENT NOTE 11/27/2012  Patient:  Sophia Rodriguez, Sophia Rodriguez  Account Number:  [id number] Admit date:  11/23/2012  Clinical Social Worker:  Cori Razor, LCSW  Date/time:  11/24/2012 01:59 PM  Clinical Social Work is seeking post-discharge placement for this patient at the following level of care:   SKILLED NURSING   (*CSW will update this form in Epic as items are completed)     Patient/family provided with [facility] Department of Clinical Social Work's list of facilities offering this level of care within the geographic area requested by the patient (or if unable, by the patient's family).  11/24/2012  Patient/family informed of their freedom to choose among providers that offer the needed level of care, that participate in Medicare, Medicaid or managed care program needed by the patient, have an available bed and are willing to accept the patient.    Patient/family informed of [facility]' ownership interest in [facility], as well as of the fact that they are under no obligation to receive care at this facility.  PASARR submitted to [employer] on  PASARR number received from [employer] on 02/26/2009  FL2 transmitted to all facilities in geographic area requested by pt/family on  11/24/2012 FL2 transmitted to all facilities within larger geographic area on   Patient informed that his/her managed care company has contracts with or will negotiate with  certain facilities, including the following:     Patient/family informed of bed offers received:  11/25/2012 Patient chooses bed at [facility] [place] Physician recommends and patient chooses bed at    Patient to be transferred to [facility], [place] on  11/27/2012 Patient to be transferred to facility by EMS  The following physician request were entered in Epic:   Additional Comments:  Cori Razor LCSW [phone]

## 2012-11-27 NOTE — Progress Notes (Signed)
Attempted to call report to [facility] x2 with no answer.  Will attempt x1 more in 10 minutes.

## 2012-11-27 NOTE — Progress Notes (Signed)
Per MD, Pt ready for d/c.  Notified RN, Pt, her daughter and facility.  Sent d/c summary.  Facility ready to receive Pt.  Arranged for transportation.  Providence Crosby, LCSWA Clinical Social Work [phone]

## 2012-11-27 NOTE — Progress Notes (Signed)
   Subjective: 3 Days Post-Op Procedure(s) (LRB): RIGHT HIP HEMIARTHROPLASTY (Right)  Pt doing well Minimal to no pain to right hip  Denies any new symptoms or issues Patient reports pain as mild.  Objective:   VITALS:   Filed Vitals:   11/27/12 0424  BP: 128/86  Pulse: 110  Temp: 99.1 F (37.3 C)  Resp: 18    Right hip incision healing well nv intact distally No rashes or edema  LABS  Recent Labs  11/25/12 0504 11/26/12 0433 11/27/12 0513  HGB 10.0* 9.2* 8.9*  HCT 31.0* 28.1* 27.7*  WBC 8.9 6.1 6.1  PLT 176 151 148*     Recent Labs  11/25/12 0504 11/26/12 0433  NA 139 138  K 4.2 3.2*  BUN 13 17  CREATININE 0.99 1.12*  GLUCOSE 184* 182*     Assessment/Plan: 3 Days Post-Op Procedure(s) (LRB): RIGHT HIP HEMIARTHROPLASTY (Right)  Continue PT/OT Pain control as needed dvt prophylaxis   Alphonsa Overall, MPAS, PA-C  11/27/2012, 7:53 AM

## 2012-11-27 NOTE — Progress Notes (Signed)
Discharged from floor via stretcher, EMT with pt. No changes in assessment. Sophia Rodriguez  

## 2012-11-29 ENCOUNTER — Encounter: Payer: Self-pay | Admitting: Internal Medicine

## 2012-11-29 ENCOUNTER — Non-Acute Institutional Stay (SKILLED_NURSING_FACILITY): Payer: Medicare Other | Admitting: Internal Medicine

## 2012-11-29 DIAGNOSIS — E876 Hypokalemia: Secondary | ICD-10-CM

## 2012-11-29 DIAGNOSIS — D62 Acute posthemorrhagic anemia: Secondary | ICD-10-CM

## 2012-11-29 DIAGNOSIS — E1149 Type 2 diabetes mellitus with other diabetic neurological complication: Secondary | ICD-10-CM

## 2012-11-29 DIAGNOSIS — E1142 Type 2 diabetes mellitus with diabetic polyneuropathy: Secondary | ICD-10-CM

## 2012-11-29 DIAGNOSIS — S72009D Fracture of unspecified part of neck of unspecified femur, subsequent encounter for closed fracture with routine healing: Secondary | ICD-10-CM

## 2012-11-29 DIAGNOSIS — I6330 Cerebral infarction due to thrombosis of unspecified cerebral artery: Secondary | ICD-10-CM

## 2012-11-29 DIAGNOSIS — S72001D Fracture of unspecified part of neck of right femur, subsequent encounter for closed fracture with routine healing: Secondary | ICD-10-CM

## 2012-11-29 DIAGNOSIS — I10 Essential (primary) hypertension: Secondary | ICD-10-CM

## 2012-11-29 NOTE — Progress Notes (Signed)
MRN: [id number] Name: Sophia Rodriguez  Sex: female Age: 62 y.o. DOB: 1950-12-08  PSC #: Ronni Rumble Facility/Room: 220 Level Of Care: SNF Provider: Merrilee Seashore D Emergency Contacts: Extended Emergency Contact Information Primary Emergency Contact: Roslynn Amble of [place] Home Phone: [phone] Mobile Phone: [phone] Relation: Daughter  Code Status: FULL  Allergies: Review of patient's allergies indicates no known allergies.  Chief Complaint  Patient presents with  . nursing home admission    HPI: Patient is 62 y.o. female who has had several strokes recently with R side weakness, who slipped and fell and is here for PT s/p R hip arthr.oplasty  Past Medical History  Diagnosis Date  . Stroke   . Hypertension   . Diabetes mellitus     Past Surgical History  Procedure Laterality Date  . Hip arthroplasty Right 11/24/2012    Procedure: RIGHT HIP HEMIARTHROPLASTY;  Surgeon: Verlee Rossetti, MD;  Location: [facility];  Service: Orthopedics;  Laterality: Right;  hemiatroplasty, DePuy Triloc      Medication List       This list is accurate as of: 11/29/12  9:49 AM.  Always use your most recent med list.               aspirin 325 MG tablet  Take 1 tablet (325 mg total) by mouth daily.     atorvastatin 40 MG tablet  Commonly known as:  LIPITOR  Take 1 tablet (40 mg total) by mouth daily at 6 PM.     citalopram 10 MG tablet  Commonly known as:  CELEXA  Take 1 tablet (10 mg total) by mouth daily.     clonazePAM 0.5 MG tablet  Commonly known as:  KLONOPIN  Take 1 tablet (0.5 mg total) by mouth daily as needed for anxiety.     enoxaparin 40 MG/0.4ML injection  Commonly known as:  LOVENOX  Inject 0.4 mLs (40 mg total) into the skin daily.     ferrous sulfate 325 (65 FE) MG tablet  Take 1 tablet (325 mg total) by mouth 3 (three) times daily after meals.     HYDROcodone-acetaminophen 5-325 MG per tablet  Commonly known as:   NORCO/VICODIN  Take 1-2 tablets by mouth every 6 (six) hours as needed.     multivitamin with minerals Tabs tablet  Take 1 tablet by mouth daily.     zolpidem 5 MG tablet  Commonly known as:  AMBIEN  Take 1 tablet (5 mg total) by mouth at bedtime as needed for sleep.        No orders of the defined types were placed in this encounter.     There is no immunization history on file for this patient.  History  Substance Use Topics  . Smoking status: Former Smoker    Quit date: 05/28/2007  . Smokeless tobacco: Not on file  . Alcohol Use: No    Family history - brother has had a stroke    Review of Systems  DATA OBTAINED: from patient GENERAL: Feels well no fevers, fatigue, appetite changes SKIN: No itching, rash or wounds EYES: No eye pain, redness, discharge EARS: No earache, tinnitus, change in hearing NOSE: No congestion, drainage or bleeding  MOUTH/THROAT: No mouth or tooth pain, No sore throat, No difficulty chewing or swallowing  RESPIRATORY: No cough, wheezing, SOB CARDIAC: No chest pain, palpitations, lower extremity edema  GI: No abdominal pain, No N/V/D or constipation, No heartburn  or reflux  GU: No dysuria, frequency or urgency, or incontinence  MUSCULOSKELETAL: No unrelieved bone/joint pain NEUROLOGIC: R side weakness is at baseline;RLE weaker than upper ext; pt had built up shoes for both feet but the right foot is the one that gets in the way when she walks PSYCHIATRIC: No overt anxiety or sadness. Sleeps well. No behavior issue.   Filed Vitals:   11/29/12 0849  BP: 146/91  Pulse: 88  Temp: 99.6 F (37.6 C)  Resp: 20    Physical Exam  GENERAL APPEARANCE: Alert, conversant. Appropriately groomed. No acute distress.  SKIN: No diaphoresis rash, or wounds HEAD: Normocephalic, atraumatic  EYES: Conjunctiva/lids clear. Pupils round, reactive. EOMs intact.  EARS: External exam WNL, canals clear. Hearing grossly normal.  NOSE: No deformity or discharge.   MOUTH/THROAT: Lips w/o lesions.   RESPIRATORY: Breathing is even, unlabored. Lung sounds are clear   CARDIOVASCULAR: Heart RRR no murmurs, rubs or gallops. No peripheral edema.   VENOUS: No varicosities. No venous stasis skin changes  GASTROINTESTINAL: Abdomen is soft, non-tender, not distended w/ normal bowel sounds GENITOURINARY: Bladder non tender, not distended  MUSCULOSKELETAL: No abnormal joints or musculature NEUROLOGIC: Oriented X3. L side facial weakness, R side UE weakness < RLE weakness PSYCHIATRIC: Mood and affect appropriate to situation, no behavioral issues  Patient Active Problem List   Diagnosis Date Noted  . Closed fracture of right hip 11/24/2012  . 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 Stroke   . Hypertension      CBC    Component Value Date/Time   WBC 6.1 11/27/2012 0513   RBC 3.36* 11/27/2012 0513   HGB 8.9* 11/27/2012 0513   HCT 27.7* 11/27/2012 0513   PLT 148* 11/27/2012 0513   MCV 82.4 11/27/2012 0513   LYMPHSABS 2.1 09/23/2012 0600   MONOABS 0.5 09/23/2012 0600   EOSABS 0.1 09/23/2012 0600   BASOSABS 0.0 09/23/2012 0600    CMP     Component Value Date/Time   NA 138 11/26/2012 0433   K 3.2* 11/26/2012 0433   CL 105 11/26/2012 0433   CO2 23 11/26/2012 0433   GLUCOSE 182* 11/26/2012 0433   BUN 17 11/26/2012 0433   CREATININE 1.12* 11/26/2012 0433   CALCIUM 9.0 11/26/2012 0433   PROT 6.7 09/23/2012 0600   ALBUMIN 3.5 09/23/2012 0600   AST 12 09/23/2012 0600   ALT 13 09/23/2012 0600   ALKPHOS 79 09/23/2012 0600   BILITOT 0.3 09/23/2012 0600   GFRNONAA 52* 11/26/2012 0433   GFRAA 60* 11/26/2012 0433    Assessment and Plan  Closed fracture of right hip R hip hemiarthroplasty 9/3 for femoral neck fracture resulting from slipping and falling at home;no LOC; PT/OT and f/u Dr.Steven Ranell Patrick, ortho, 2 weeks;l DVT prophylaxis with Lovenox  DM type 2 causing neurological disease Is on no medications but mentioned once that pt was off  metformin.Pt says she was on Januvia and another pill until her hosp in July in which she was taken off everything.  HbA1c 11/2012 5.8, 08/2012 5.9 and 3 yrs ago 6.9 after being 9.8 also 3 yrs ago. Plan CBG ac and q HS with routine sliding scale. Pt to f/u 3 weeks with Dr Fleet Contras, Internal medicine.  Hypertension On no meds;pt says she was on furosemide before; records cited reasonable control in hospital;some of her readings in hospital were SBP 114, 115, 116, 108; higher today, will observe  Thrombotic cerebral infarction Pt  had 2 strokes, then another 08/30/2012 and another 7/1 considered an extension of the 6/9 with residual R side weakness; stated felt to be small vessel dx, don't see source of thrombnus mentioned; pt is to be on ASA 325mg  and Statin daily; LDL 3 months ago was 128  Post op low K+, repleated po just prior to d/c and post  surgical anemia-CBC and BMP to recheck  Margit Hanks, MD

## 2012-11-29 NOTE — Assessment & Plan Note (Addendum)
R hip hemiarthroplasty 9/3 for femoral neck fracture resulting from slipping and falling at home;no LOC; PT/OT and f/u Dr.Steven Ranell Patrick, ortho, 2 weeks;l DVT prophylaxis with Lovenox

## 2012-11-29 NOTE — Assessment & Plan Note (Addendum)
On no meds;pt says she was on furosemide before; records cited reasonable control in hospital;some of her readings in hospital were SBP 114, 115, 116, 108; higher today, will observe

## 2012-11-29 NOTE — Assessment & Plan Note (Addendum)
Pt  had 2 strokes, then another 08/30/2012 and another 7/1 considered an extension of the 6/9 with residual R side weakness; stated felt to be small vessel dx, don't see source of thrombnus mentioned; pt is to be on ASA 325mg  and Statin daily; LDL 3 months ago was 128

## 2012-11-29 NOTE — Assessment & Plan Note (Addendum)
Is on no medications but mentioned once that pt was off metformin.Pt says she was on Januvia and another pill until her hosp in July in which she was taken off everything.  HbA1c 11/2012 5.8, 08/2012 5.9 and 3 yrs ago 6.9 after being 9.8 also 3 yrs ago. Plan CBG ac and q HS with routine sliding scale. Pt to f/u 3 weeks with Dr Fleet Contras, Internal medicine.

## 2012-12-06 ENCOUNTER — Other Ambulatory Visit: Payer: Self-pay | Admitting: Internal Medicine

## 2012-12-06 ENCOUNTER — Other Ambulatory Visit: Payer: Self-pay | Admitting: *Deleted

## 2012-12-06 MED ORDER — HYDROCODONE-ACETAMINOPHEN 5-325 MG PO TABS: ORAL_TABLET | ORAL | Status: DC

## 2012-12-09 NOTE — Progress Notes (Signed)
OT gcode late entry   12/09/12 1600  OT G-codes **NOT FOR INPATIENT CLASS**  Functional Assessment Tool Used clinical judgement  Functional Limitation Self care  Self Care Current Status [phone]) CL  Self Care Goal Status ([id number]) Rodman Comp   OTR/L Pager: [phone] Office: [phone] .

## 2012-12-26 ENCOUNTER — Encounter (HOSPITAL_COMMUNITY): Payer: Self-pay | Admitting: Emergency Medicine

## 2012-12-26 ENCOUNTER — Emergency Department (HOSPITAL_COMMUNITY): Payer: Medicare Other

## 2012-12-26 ENCOUNTER — Emergency Department (HOSPITAL_COMMUNITY)
Admission: EM | Admit: 2012-12-26 | Discharge: 2012-12-27 | Disposition: A | Discharge status: Home / Self Care | Payer: Medicare Other | Attending: Emergency Medicine | Admitting: Emergency Medicine

## 2012-12-26 DIAGNOSIS — Z87891 Personal history of nicotine dependence: Secondary | ICD-10-CM | POA: Insufficient documentation

## 2012-12-26 DIAGNOSIS — R531 Weakness: Secondary | ICD-10-CM

## 2012-12-26 DIAGNOSIS — G459 Transient cerebral ischemic attack, unspecified: Secondary | ICD-10-CM

## 2012-12-26 DIAGNOSIS — Z7982 Long term (current) use of aspirin: Secondary | ICD-10-CM | POA: Insufficient documentation

## 2012-12-26 DIAGNOSIS — E119 Type 2 diabetes mellitus without complications: Secondary | ICD-10-CM | POA: Insufficient documentation

## 2012-12-26 DIAGNOSIS — I10 Essential (primary) hypertension: Secondary | ICD-10-CM | POA: Insufficient documentation

## 2012-12-26 DIAGNOSIS — Z79899 Other long term (current) drug therapy: Secondary | ICD-10-CM | POA: Insufficient documentation

## 2012-12-26 LAB — BASIC METABOLIC PANEL
BUN: 14 mg/dL (ref 6–23)
Calcium: 9.7 mg/dL (ref 8.4–10.5)
Chloride: 98 mEq/L (ref 96–112)
GFR calc Af Amer: 76 mL/min — ABNORMAL LOW (ref >90)
GFR calc non Af Amer: 66 mL/min — ABNORMAL LOW (ref >90)
Potassium: 3.9 mEq/L (ref 3.5–5.1)
Sodium: 136 mEq/L (ref 135–145)

## 2012-12-26 LAB — CBC
MCHC: 32.9 g/dL (ref 30.0–36.0)
RDW: 14.5 % (ref 11.5–15.5)
WBC: 4.6 10*3/uL (ref 4.0–10.5)

## 2012-12-26 LAB — APTT: aPTT: 35 seconds (ref 24–37)

## 2012-12-26 NOTE — ED Notes (Signed)
This RN observed that once the pt started talking, she would smile occasionally. Pt also yawned quite a bit.

## 2012-12-26 NOTE — ED Provider Notes (Signed)
CSN: [id number]     Arrival date & time 12/26/12  2132 History   First MD Initiated Contact with Patient 12/26/12 2133     Chief Complaint  Patient presents with  . Altered Mental Status   (Consider location/radiation/quality/duration/timing/severity/associated sxs/prior Treatment) Patient is a 62 y.o. female presenting with altered mental status.  Altered Mental Status Presenting symptoms: partial responsiveness   Severity:  Moderate Most recent episode:  Today Episode history:  Single Duration:  3 hours Timing:  Constant Progression:  Unchanged Chronicity:  Recurrent Context: nursing home resident   Associated symptoms: slurred speech   Associated symptoms: no abdominal pain, no agitation, no fever, no light-headedness, no nausea, no rash, no seizures and no vomiting     Past Medical History  Diagnosis Date  . Stroke   . Hypertension   . Diabetes mellitus    Past Surgical History  Procedure Laterality Date  . Hip arthroplasty Right 11/24/2012    Procedure: RIGHT HIP HEMIARTHROPLASTY;  Surgeon: Verlee Rossetti, MD;  Location: [facility];  Service: Orthopedics;  Laterality: Right;  hemiatroplasty, DePuy Triloc   Family History  Problem Relation Age of Onset  . Stroke Brother    History  Substance Use Topics  . Smoking status: Former Smoker    Quit date: 05/28/2007  . Smokeless tobacco: Not on file  . Alcohol Use: No   OB History   Grav Para Term Preterm Abortions TAB SAB Ect Mult Living                 Review of Systems  Constitutional: Negative for fever and chills.  HENT: Negative for congestion, sore throat and rhinorrhea.   Eyes: Negative for photophobia and visual disturbance.  Respiratory: Negative for cough and shortness of breath.   Cardiovascular: Negative for chest pain and leg swelling.  Gastrointestinal: Negative for nausea, vomiting, abdominal pain, diarrhea and constipation.  Endocrine: Negative for polyphagia and polyuria.  Genitourinary: Negative  for dysuria, flank pain, vaginal bleeding, vaginal discharge and enuresis.  Musculoskeletal: Negative for back pain and gait problem.  Skin: Negative for color change and rash.  Neurological: Negative for dizziness, seizures, syncope, light-headedness and numbness.  Hematological: Negative for adenopathy. Does not bruise/bleed easily.  Psychiatric/Behavioral: Negative for agitation.  All other systems reviewed and are negative.    Allergies  Review of patient's allergies indicates no known allergies.  Home Medications   Current Outpatient Rx  Name  Route  Sig  Dispense  Refill  . aspirin 325 MG tablet   Oral   Take 1 tablet (325 mg total) by mouth daily.         Marland Kitchen atorvastatin (LIPITOR) 40 MG tablet   Oral   Take 1 tablet (40 mg total) by mouth daily at 6 PM.   30 tablet   1   . citalopram (CELEXA) 10 MG tablet   Oral   Take 1 tablet (10 mg total) by mouth daily.   30 tablet   1   . clonazePAM (KLONOPIN) 0.5 MG tablet   Oral   Take 1 tablet (0.5 mg total) by mouth daily as needed for anxiety.   30 tablet   0   . enoxaparin (LOVENOX) 40 MG/0.4ML injection   Subcutaneous   Inject 0.4 mLs (40 mg total) into the skin daily.   10 mL   0     Lovenox per pharmacy dosing recommendation for 30  ...   . ferrous sulfate 325 (65 FE) MG tablet  Oral   Take 1 tablet (325 mg total) by mouth 3 (three) times daily after meals.   90 tablet   3   . HYDROcodone-acetaminophen (NORCO/VICODIN) 5-325 MG per tablet      Take one tablet by mouth every 6 hours as needed moderate pain; Take two tablets by mouth every 6 hours as needed for severe pain   360 tablet   0   . Multiple Vitamin (MULTIVITAMIN WITH MINERALS) TABS   Oral   Take 1 tablet by mouth daily.         Marland Kitchen zolpidem (AMBIEN) 5 MG tablet   Oral   Take 1 tablet (5 mg total) by mouth at bedtime as needed for sleep.   30 tablet   0    BP 115/79  Pulse 91  Temp(Src) 97.9 F (36.6 C) (Oral)  Resp 17  SpO2  100% Physical Exam  Vitals reviewed. Constitutional: She is oriented to person, place, and time. She appears well-developed and well-nourished.  HENT:  Head: Normocephalic and atraumatic.  Right Ear: External ear normal.  Left Ear: External ear normal.  Eyes: Conjunctivae and EOM are normal. Pupils are equal, round, and reactive to light.  Neck: Normal range of motion. Neck supple.  Cardiovascular: Normal rate, regular rhythm, normal heart sounds and intact distal pulses.   Pulmonary/Chest: Effort normal and breath sounds normal.  Abdominal: Soft. Bowel sounds are normal. There is no tenderness.  Musculoskeletal: Normal range of motion.  Neurological: She is alert and oriented to person, place, and time. She has normal strength. A cranial nerve deficit (L sided facial weakness) is present. No sensory deficit.  Skin: Skin is warm and dry.    ED Course  Procedures (including critical care time) Labs Review Labs Reviewed  CBC - Abnormal; Notable for the following:    Hemoglobin 10.9 (*)    HCT 33.1 (*)    All other components within normal limits  BASIC METABOLIC PANEL - Abnormal; Notable for the following:    Glucose, Bld 101 (*)    GFR calc non Af Amer 66 (*)    GFR calc Af Amer 76 (*)    All other components within normal limits  APTT   Imaging Review Dg Chest 2 View  12/26/2012   *RADIOLOGY REPORT*  Clinical Data: Altered mental status.  CHEST - 2 VIEW  Comparison: Chest radiograph performed 11/24/2012  Findings: The lungs are relatively well-aerated.  Minimal left basilar opacity likely reflects atelectasis.  There is no evidence of pleural effusion or pneumothorax.  The heart is normal in size; the mediastinal contour is within normal limits.  No acute osseous abnormalities are seen.  IMPRESSION: Minimal left basilar opacity likely post atelectasis; lungs otherwise clear.   Original Report Authenticated By: Tonia Ghent, M.D.   Ct Head Wo Contrast  12/26/2012   *RADIOLOGY  REPORT*  Clinical Data: Symptoms of transient ischemic attack.  CT HEAD WITHOUT CONTRAST  Technique:  Contiguous axial images were obtained from the base of the skull through the vertex without contrast.  Comparison: CT of the head and MRI of the brain performed 09/21/2012  Findings: There is no evidence of acute infarction, mass lesion, or intra- or extra-axial hemorrhage on CT.  A remote infarct is again noted at the left frontal lobe, with associated encephalomalacia.  This is grossly unchanged in appearance.  There is mild ex vacuo dilatation of the frontal horn of the left lateral ventricle.  Scattered periventricular white matter change likely reflects small  vessel ischemic microangiopathy.  The previously noted small infarct at the posterior limb of the right internal capsule is not well characterized.  The posterior fossa, including the cerebellum, brainstem and fourth ventricle, is within normal limits.  No mass effect or midline shift is seen.  There is no evidence of fracture; visualized osseous structures are unremarkable in appearance.  The orbits are within normal limits. The paranasal sinuses and mastoid air cells are well-aerated.  No significant soft tissue abnormalities are seen.  IMPRESSION:  1.  No acute intracranial pathology seen to explain the patient's symptoms. 2.  Remote infarct again noted at the left frontal lobe, with associated encephalomalacia and mild ex vacuo dilatation of the frontal horn of the left lateral ventricle. 3.  Scattered small vessel ischemic microangiopathy.   Original Report Authenticated By: Tonia Ghent, M.D.    Date: 12/27/2012  Rate: 85  Rhythm: normal sinus rhythm  QRS Axis: normal  Intervals: normal  ST/T Wave abnormalities: nonspecific ST changes  Conduction Disutrbances: none  Narrative Interpretation: NSR without acute pathology to explain condition  Old EKG Reviewed: No significant changes noted     MDM   1. Weakness   2. TIA (transient  ischemic attack)    62 y.o. female  with pertinent PMH of CVA, HTN, DM presents with acute onset L sided weakness and muffled speech beginning abruptly at 6:30.  Pt has ho some residual L weakness per neuro notes, however at baseline is alert and oriented.  Physical exam as above with L facial droop (chronic on notes), strength 5/5 bilaterally.  CT scan with no acute findings.  labwork as above unremarkable for pt.  Consulted neurology, given recent wu for same they do not recommend admission or further wu at this time, feel pt medically stable for dc.  Pt DC to SNF in stable condition.  Labs and imaging as above reviewed by myself and attending,Dr. Redgie Grayer, with whom case was discussed.   1. Weakness   2. TIA (transient ischemic attack)         Noel Gerold, MD 12/27/12 1220

## 2012-12-26 NOTE — ED Notes (Signed)
Report pre EMS. They were initially called out for stroke symptoms. Staff at the nursing home states that when they got her up to do evening activities, she was weaker than normal, and more "soft-spoken", and slower to respond. Facility staff reports the pt was exhibiting left sided weakness. EMS states that their stroke screen was negative, and that she was alert and oriented x 4. Pt has hx of previous CVA. Pt denies any complaints.

## 2012-12-28 NOTE — ED Provider Notes (Signed)
I saw and evaluated the patient, reviewed the resident's note and I agree with the findings and plan.  I reviewed the EKG and images.  Lengthy stay in ER w/o issue.  Neurology c/s who recommends no further evaluation.  VSS.  D/c to SNF.   Darlys Gales, MD 12/28/12 [phone]

## 2013-12-19 ENCOUNTER — Non-Acute Institutional Stay (INDEPENDENT_AMBULATORY_CARE_PROVIDER_SITE_OTHER): Payer: Medicare Other | Admitting: Family Medicine

## 2013-12-19 DIAGNOSIS — Z593 Problems related to living in residential institution: Secondary | ICD-10-CM

## 2013-12-19 NOTE — Progress Notes (Signed)
Patient ID: Sophia Rodriguez, female   DOB: 11/08/1950, 63 y.o.   MRN: [id number] Opened note to change NH team to [facility].

## 2014-01-31 ENCOUNTER — Ambulatory Visit: Payer: Medicare Other | Admitting: Neurology

## 2014-05-08 ENCOUNTER — Institutional Professional Consult (permissible substitution): Payer: Medicare Other | Admitting: Neurology

## 2014-05-10 ENCOUNTER — Ambulatory Visit (INDEPENDENT_AMBULATORY_CARE_PROVIDER_SITE_OTHER): Payer: Medicare Other | Admitting: Neurology

## 2014-05-10 ENCOUNTER — Encounter: Payer: Self-pay | Admitting: Neurology

## 2014-05-10 VITALS — BP 148/95 | HR 84 | Ht 65.0 in | Wt 189.0 lb

## 2014-05-10 DIAGNOSIS — R269 Unspecified abnormalities of gait and mobility: Secondary | ICD-10-CM | POA: Diagnosis not present

## 2014-05-10 DIAGNOSIS — G8190 Hemiplegia, unspecified affecting unspecified side: Secondary | ICD-10-CM

## 2014-05-10 DIAGNOSIS — R202 Paresthesia of skin: Secondary | ICD-10-CM

## 2014-05-10 DIAGNOSIS — M542 Cervicalgia: Secondary | ICD-10-CM

## 2014-05-10 DIAGNOSIS — G8191 Hemiplegia, unspecified affecting right dominant side: Secondary | ICD-10-CM | POA: Insufficient documentation

## 2014-05-10 NOTE — Progress Notes (Signed)
[facility]    Provider:  Dr Lucia Gaskins Referring Provider: Dorrene German, MD Primary Care Physician:  Dorrene German, MD  CC:  Stroke   HPI:  Sophia Rodriguez is a 64 y.o. female here as a referral from Dr. Concepcion Elk for stroke. She has a PMHx of HTN, DM. 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 First stroke 5 years ago. Had 2 a few years ago. She is compliant with medications. Her gait has worsened. She used to use a cane and now she is using a walker. She can't get around like she used to. She feels weak. She is having word-finding difficulty. Sometimes when she is laying in bed it hurts in her rightear, no dizziness or room spinning. It just hurts. She can't sleep with her right arm extended because the arm goes numb. (Very poor historian, psychomotor slowing). She can't close her right hand all the way, some numbness in the fingers. She has difficulty using right arm. Sometimes her left hand "goes and goes and goes" and she can't stop it. She has to stop herself by touching it, if she makes a concerted effort she can stop her hand. She has neck pain and when she moves it a certain way she can feel pain but no radicular symptoms. She lives alone in her apartment. She has a cleaning person, daughter helps with medications. She babysits her grandaughter every day, she is 8. No falls. She is on aspirin for stroke prevention, reports she was not on aspirin during her last strokes. She has a primary care physician that she likes very much and manages her medications, cholesterol, glucose and blood pressure.   Reviewed notes, labs and imaging from outside physicians, which showed:   CT of the head 12/2012: A remote infarct is again noted at the left frontal lobe, with associated encephalomalacia. This is grossly unchanged in appearance. There is mild ex vacuo dilatation of the frontal horn of the left lateral ventricle.  Scattered periventricular white matter change likely  reflects small vessel ischemic microangiopathy. The previously noted small infarct at the posterior limb of the right internal capsule is not well characterized.  The posterior fossa, including the cerebellum, brainstem and fourth ventricle, is within normal limits. No mass effect or midline shift is seen.  There is no evidence of fracture; visualized osseous structures are unremarkable in appearance. The orbits are within normal limits. The paranasal sinuses and mastoid air cells are well-aerated. No significant soft tissue abnormalities are seen.  IMPRESSION:  1. No acute intracranial pathology seen to explain the patient's symptoms. 2. Remote infarct again noted at the left frontal lobe, with associated encephalomalacia and mild ex vacuo dilatation of the frontal horn of the left lateral ventricle. 3. Scattered small vessel ischemic microangiopathy.  MRI of the brain 09/2012: IMPRESSION: 1. Acute ischemic infarct involving the posterior limb of the right internal capsule, at or just adjacent to recently identified acute ischemic infarct seen in this region on MRI from 08/26/2012. 2. Stable appearance of additional remote infarcts as detailed Above.  (Reviewed images and agree with above findings.)  MRA of the head 08/2012: MRA HEAD  Findings: Cavernous segment internal carotid artery narrowing and irregularity more notable on the left.  Hypoplastic A1 segment right anterior cerebral artery with narrowing and irregularity suggesting superimposed atherosclerotic type changes.  Mild narrowing M1 segment right middle cerebral artery.  Middle cerebral artery branch vessel mild to moderate irregularity narrowing.  Mild irregularity of the A2 segment of  the anterior cerebral artery bilaterally.  Ectatic vertebral arteries and basilar artery. The left vertebral artery is dominant. Very mild narrowing of the left vertebral artery after the takeoff of  the left PICA. Mild narrowing distal right vertebral artery.  Irregularity and narrowing of the right PICA which is smaller than the left PICA.  Mild irregularity of the basilar artery without high-grade stenosis. Nonvisualization AICA bilaterally.  Moderate narrowing the superior cerebellar artery bilaterally.  Mild narrowing proximal right posterior cerebral artery. P2 segment the posterior cerebral artery mild to moderate irregularity greater on the right. Posterior cerebral artery distal branch vessel narrowing irregularity bilaterally.  No aneurysm or vascular malformation noted.  MRA of the neck: 08/2012:   IMPRESSION: No evidence hemodynamically significant stenosis involving either carotid bifurcation.  Left vertebral artery is dominant. Ectatic proximal left vertebral artery with fold and mild narrowing. Minimal narrowing proximal right vertebral artery.  Cbc 12/2012 with anemia, bmp 12/2012 with gfr 76 (N>90). No recent labs available to review.  2d echo and carotid dopplers 08/2012: Study Conclusions  Left ventricle: The cavity size was normal. Wall thickness was normal. Systolic function was normal. The estimated ejection fraction was in the range of 60% to 65%. Wall motion was normal; there were no regional wall motion abnormalities.   Carotid Summary: - Findings consistent with less than 39 percent stenosis involving the right internal carotid artery and the left internal carotid artery. - Bilateral vertebral artery flow antegrade.    Review of Systems: Patient complains of symptoms per HPI as well as the following symptoms: activity change, eye redness Pertinent negatives per HPI. All others negative.   History   Social History  . Marital Status: Single    Spouse Name: N/A  . Number of Children: 2  . Years of Education: Grad schoo   Occupational History  . Not on file.   Social History Main Topics  . Smoking status: Former  Smoker    Quit date: 05/28/2007  . Smokeless tobacco: Not on file  . Alcohol Use: No  . Drug Use: No  . Sexual Activity: Not on file   Other Topics Concern  . Not on file   Social History Narrative   Lives at home by herself.    Daughter come to help at home and she has a cleaning lady to help twice a week.    Caffeine: Every morning 1-1.5 cups coffee/day, and 12oz tea/day     Family History  Problem Relation Age of Onset  . Stroke Brother     Past Medical History  Diagnosis Date  . Stroke   . Hypertension   . Diabetes mellitus     Past Surgical History  Procedure Laterality Date  . Hip arthroplasty Right 11/24/2012    Procedure: RIGHT HIP HEMIARTHROPLASTY;  Surgeon: Verlee Rossetti, MD;  Location: [facility];  Service: Orthopedics;  Laterality: Right;  hemiatroplasty, DePuy Triloc    Current Outpatient Prescriptions  Medication Sig Dispense Refill  . aspirin 325 MG tablet Take 1 tablet (325 mg total) by mouth daily.    Marland Kitchen atorvastatin (LIPITOR) 40 MG tablet Take 1 tablet (40 mg total) by mouth daily at 6 PM. 30 tablet 1  . cetirizine (ZYRTEC) 10 MG tablet Take 10 mg by mouth daily.    . citalopram (CELEXA) 10 MG tablet Take 1 tablet (10 mg total) by mouth daily. 30 tablet 1  . clonazePAM (KLONOPIN) 0.5 MG tablet Take 1 tablet (0.5 mg total) by mouth daily as needed  for anxiety. (Patient not taking: Reported on 05/10/2014) 30 tablet 0  . enoxaparin (LOVENOX) 40 MG/0.4ML injection Inject 0.4 mLs (40 mg total) into the skin daily. (Patient not taking: Reported on 05/10/2014) 10 mL 0  . ferrous sulfate 325 (65 FE) MG tablet Take 1 tablet (325 mg total) by mouth 3 (three) times daily after meals. 90 tablet 3  . gabapentin (NEURONTIN) 300 MG capsule Take 300 mg by mouth once.    Marland Kitchen HYDROcodone-acetaminophen (NORCO/VICODIN) 5-325 MG per tablet Take one tablet by mouth every 6 hours as needed moderate pain; Take two tablets by mouth every 6 hours as needed for severe pain (Patient not  taking: Reported on 05/10/2014) 360 tablet 0  . Multiple Vitamin (MULTIVITAMIN WITH MINERALS) TABS Take 1 tablet by mouth daily.    Marland Kitchen zolpidem (AMBIEN) 5 MG tablet Take 1 tablet (5 mg total) by mouth at bedtime as needed for sleep. 30 tablet 0   No current facility-administered medications for this visit.    Allergies as of 05/10/2014  . (No Known Allergies)    Vitals: BP 148/95 mmHg  Pulse 84  Ht  (1.651 m)  Wt 189 lb (85.73 kg)  BMI 31.45 kg/m2 Last Weight:  Wt Readings from Last 1 Encounters:  05/10/14 189 lb (85.73 kg)   Last Height:   Ht Readings from Last 1 Encounters:  05/10/14  (1.651 m)   Physical exam: Exam: Gen: NAD, conversant, well nourised, obese, well groomed                     CV: RRR, no MRG. No Carotid Bruits. No peripheral edema, warm, nontender Eyes: Conjunctivae clear without exudates or hemorrhage  Neuro: Detailed Neurologic Exam  Speech:    Speech is normal; fluent and spontaneous with normal comprehension.  Cognition:    The patient is oriented to person, place, and time;     recent and remote memory impaired;     language fluent;     normal attention, concentration,     fund of knowledge appears intact Cranial Nerves: mild masked facies, left NL flatt?     The pupils are equal, round, and reactive to light. The fundi are flat. Visual fields are full to finger confrontation. Extraocular movements are intact. Trigeminal sensation is intact and the muscles of mastication are normal. The face is symmetric. The palate elevates in the midline. Hearing intact. Voice is normal. Shoulder shrug is normal. The tongue has normal motion without fasciculations.   Coordination:    Bradykinesia with FTN and finger taps left > right. Cannot do HTS.   Gait:    Dragging the right leg. Wide based. Using a walker. Slow.   Motor Observation: Mild left pronator drift    No asymmetry, no atrophy, and no involuntary movements noted. Tone:    Normal  muscle tone.    Posture:    Posture is mildly stooped    Strength: limited by participation but appears right triceps and right hip flexion, right biceps and DF weakness, left triceps and left hip flexion weakness  Otherwise strength is intact in the upper and lower limbs.      Sensation: intact to LT     Reflex Exam:  DTR's: Absent patellars Toes:    The toes are equivocal bilaterally.   Clonus:    Clonus is absent.    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 She has right-sided weakness from left frontal stroke and left-sided weakness from right thalamic and right PLIC infarcts. She is having increasing gait problems and is dragging her right leg. Unfortunately I don't have any documented exams since 09/2012 so I am unclear if any of her symptoms could be due to new strokes. She has been on ASA 325mg  since being discharged from stroke service in 2014. 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      Repeat  MRI of the brain Continue ASA 325 orally every day for secondary stroke prevention and maintain strict control of hypertension with blood pressure goal below 130/90, diabetes with hemoglobin A1c goal below 6.5% and lipids with LDL cholesterol goal below 70 mg/dL.  EMG/NCS on the right arm to evaluate for CTS PT at home (dragging right leg, weakness from stroke), gait abnormality, neck pain. Patient cannot ambulate safely without help. Her daughter accompanied today.    Naomie DeanAntonia Ahern, MD  [facility] [address] [place], [place] [address]  Phone [phone] Fax [phone]

## 2014-05-10 NOTE — Patient Instructions (Addendum)
  Overall you are doing fairly well but I do want to suggest a few things today:   Remember to drink plenty of fluid, eat healthy meals and do not skip any meals. Try to eat protein with a every meal and eat a healthy snack such as fruit or nuts in between meals. Try to keep a regular sleep-wake schedule and try to exercise daily, particularly in the form of walking, 20-30 minutes a day, if you can.   As far as your medications are concerned, I would like to suggest: Continue current medications  As far as diagnostic testing: EMG/NCS on the right arm, In-home PT  I would like to see you back in a few weeks for emg/ncs, sooner if we need to. Please call us with any interim questions, concerns, problems, updates or refill requests.   Please also call us for any test results so we can go over those with you on the phone.  My clinical assistant and will answer any of your questions and relay your messages to me and also relay most of my messages to you.   Our phone number is [phone]. We also have an after hours call service for urgent matters and there is a physician on-call for urgent questions. For any emergencies you know to call 911 or go to the nearest emergency room

## 2014-05-16 ENCOUNTER — Ambulatory Visit (INDEPENDENT_AMBULATORY_CARE_PROVIDER_SITE_OTHER): Payer: Self-pay | Admitting: Neurology

## 2014-05-16 ENCOUNTER — Ambulatory Visit (INDEPENDENT_AMBULATORY_CARE_PROVIDER_SITE_OTHER): Payer: Medicare Other | Admitting: Neurology

## 2014-05-16 DIAGNOSIS — R202 Paresthesia of skin: Secondary | ICD-10-CM

## 2014-05-16 DIAGNOSIS — Z0289 Encounter for other administrative examinations: Secondary | ICD-10-CM

## 2014-05-25 ENCOUNTER — Other Ambulatory Visit: Payer: Self-pay | Admitting: Internal Medicine

## 2014-05-25 DIAGNOSIS — E2839 Other primary ovarian failure: Secondary | ICD-10-CM

## 2014-06-08 NOTE — Progress Notes (Signed)
See procedure note.

## 2014-06-08 NOTE — Progress Notes (Signed)
  [facility]    Provider:  Dr Lucia GaskinsAhern Referring Provider: Anson FretAhern, Robertson Colclough B, MD Primary Care Physician:  Dorrene GermanAVBUERE,EDWIN A, MD  HPI:  Sophia Rodriguez is a 64 y.o. female here for evaluation of hand pain.   Summary:   Nerve Conduction Studies were performed on the bilateral upper extremities.  Bilateral APB median motor nerves showed normal conductions with normal F wave latencies  The right Ulnar ADM motor nerve showed decreased conduction velocity (Wrist-B Elbow, 3547m/s, N>50) and decreased conduction velocity (A Elbow-B Elbow, 41 m/s, N>50) with normal F wave latency The left Ulnar ADM motor nerve showed normal conductions with normal F wave latency  The right Median 2nd digit sensory nerve showed prolonged distal peak latency (4.0 ms, N<3.9) The left Median 2nd digit sensory nerve showed prolonged distal peak latency (4.0 ms, N<3.9)  The right Ulnar 5th Digit sensory nerve showed no response The left Ulnar 5th Digit sensory nerve showed prolonged distal peak latency (3.9 ms, N<3.5) and reduced amplitude (3.0 V, N>10)  The right radial sensory nerve showed prolonged distal peak latency (2.6 ms, N<2.5) The left radial sensory nerve showed prolonged distal peak latency (2.7 ms, N<2.5)   The right median/ulnar (palm) comparison nerve showed absent response (Ulnar palm) and prolonged distal peak latency (2.7 ms, N<2.2, median palm)  The left median/ulnar (palm) comparison nerve showed prolonged distal peak latency (2.3 ms, N<2.2, median palm), prolonged distal peak latency (2.5 ms, N<2.2, ulnar palm) and reduced amplitude (9.0 V, N>10, Ulnar)  EMG needle study of selected right upper extremity muscles was performed. The following muscles were normal: Deltoid, Triceps, Pronator Teres, Flexor Digitorum Profundus(Ulnar), Opponens Pollicis, First Dorsal Interosseous, C7,C8 paraspinals.   Conclusion: This is an abnormal study. 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 Clinical correlation recommended.   Naomie DeanAntonia Mone Commisso, MD  [facility] [address] [place], [place] [id number]  Phone [phone] Fax [phone]

## 2014-06-08 NOTE — Procedures (Signed)
  [id number]    Provider:  Dr Lucia GaskinsAhern Referring Provider: Anson FretAhern, Reginold Beale B, MD Primary Care Physician:  Dorrene GermanAVBUERE,EDWIN A, MD  HPI:  Sophia Rodriguez is a 64 y.o. female here for evaluation of hand pain.   Summary:   Nerve Conduction Studies were performed on the bilateral upper extremities.  Bilateral APB median motor nerves showed normal conductions with normal F wave latencies  The right Ulnar ADM motor nerve showed decreased conduction velocity (Wrist-B Elbow, 3734m/s, N>50) and decreased conduction velocity (A Elbow-B Elbow, 41 m/s, N>50) with normal F wave latency The left Ulnar ADM motor nerve showed normal conductions with normal F wave latency  The right Median 2nd digit sensory nerve showed prolonged distal peak latency (4.0 ms, N<3.9) The left Median 2nd digit sensory nerve showed prolonged distal peak latency (4.0 ms, N<3.9)  The right Ulnar 5th Digit sensory nerve showed no response The left Ulnar 5th Digit sensory nerve showed prolonged distal peak latency (3.9 ms, N<3.5) and reduced amplitude (3.0 V, N>10)  The right radial sensory nerve showed prolonged distal peak latency (2.6 ms, N<2.5) The left radial sensory nerve showed prolonged distal peak latency (2.7 ms, N<2.5)   The right median/ulnar (palmar mixed) comparison nerve showed absent response (Ulnar palm) and prolonged distal peak latency (2.7 ms, N<2.2, median palm)  The left median/ulnar (palmar mixed) comparison nerve showed prolonged distal peak latency (2.3 ms, N<2.2, median palm), prolonged distal peak latency (2.5 ms, N<2.2, ulnar palm) and reduced amplitude (9.0 V, N>10, Ulnar)  EMG needle study of selected right upper extremity muscles was performed. The following muscles were normal: Deltoid, Triceps, Pronator Teres, Flexor Digitorum Profundus(Ulnar), Opponens Pollicis, First Dorsal Interosseous, C7,C8 paraspinals.   Conclusion: This is an abnormal study. There is electrophysiologic  evidence of non-localizing right Ulnar Neuropathy.  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 Clinical correlation recommended.   Naomie DeanAntonia Rebekah Zackery, MD  [facility] [address] [place], [place] [address]  Phone [phone] Fax [phone]

## 2014-11-08 ENCOUNTER — Encounter: Payer: Self-pay | Admitting: Neurology

## 2014-11-08 ENCOUNTER — Ambulatory Visit (INDEPENDENT_AMBULATORY_CARE_PROVIDER_SITE_OTHER): Payer: Medicare Other | Admitting: Neurology

## 2014-11-08 VITALS — BP 156/84 | HR 81 | Ht 65.0 in | Wt 190.0 lb

## 2014-11-08 DIAGNOSIS — G8110 Spastic hemiplegia affecting unspecified side: Secondary | ICD-10-CM | POA: Diagnosis not present

## 2014-11-08 DIAGNOSIS — E0840 Diabetes mellitus due to underlying condition with diabetic neuropathy, unspecified: Secondary | ICD-10-CM | POA: Diagnosis not present

## 2014-11-08 MED ORDER — GABAPENTIN 300 MG PO CAPS: 300.0000 mg | ORAL_CAPSULE | Freq: Three times a day (TID) | ORAL | Status: DC

## 2014-11-08 MED ORDER — CITALOPRAM HYDROBROMIDE 20 MG PO TABS: 20.0000 mg | ORAL_TABLET | Freq: Every day | ORAL | Status: DC

## 2014-11-08 NOTE — Patient Instructions (Signed)
I had a long d/w patient about her remote strokes, risk for recurrent stroke/TIAs, personally independently reviewed imaging studies and stroke evaluation results and answered questions.Continue aspirin 325 mg orally every day  for secondary stroke prevention and maintain strict control of hypertension with blood pressure goal below 130/90, diabetes with hemoglobin A1c goal below 6.5% and lipids with LDL cholesterol goal below 100 mg/dL. I also advised the patient . About gait and safety precautions. We will refer the patient for home physical and occupational therapy for gait and safety. Increase gabapentin dose to 300 mg 3 times daily to help his spasticity and increase Celexa dose to 20 mg daily. Check neuropathy panel labs and hemoglobin A1c. Check brain MRI scan to evaluate for new interval stroke. Greater than 50% time of this 25 minute visit was spent on counseling and coordination of care. Return for follow-up in 3 months with Dr. Lucia Gaskins.

## 2014-11-08 NOTE — Progress Notes (Addendum)
[facility]    Provider:  Dr Lucia Gaskins Referring Provider: Fleet Contras, MD Primary Care Physician:  Dorrene German, MD  CC:  Stroke   HPI: Dr Lucia Gaskins 05/10/2014 :   Sophia Rodriguez is a 64 y.o. female here as a referral from Dr. Concepcion Elk for stroke. She has a PMHx of HTN, DM. 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 First stroke 5 years ago. Had 2 a few years ago. She is compliant with medications. Her gait has worsened. She used to use a cane and now she is using a walker. She can't get around like she used to. She feels weak. She is having word-finding difficulty. Sometimes when she is laying in bed it hurts in her rightear, no dizziness or room spinning. It just hurts. She can't sleep with her right arm extended because the arm goes numb. (Very poor historian, psychomotor slowing). She can't close her right hand all the way, some numbness in the fingers. She has difficulty using right arm. Sometimes her left hand "goes and goes and goes" and she can't stop it. She has to stop herself by touching it, if she makes a concerted effort she can stop her hand. She has neck pain and when she moves it a certain way she can feel pain but no radicular symptoms. She lives alone in her apartment. She has a cleaning person, daughter helps with medications. She babysits her grandaughter every day, she is 8. No falls. She is on aspirin for stroke prevention, reports she was not on aspirin during her last strokes. She has a primary care physician that she likes very much and manages her medications, cholesterol, glucose and blood pressure.   Reviewed notes, labs and imaging from outside physicians, which showed:   CT of the head 12/2012: A remote infarct is again noted at the left frontal lobe, with associated encephalomalacia. This is grossly unchanged in appearance. There is mild ex vacuo dilatation of the frontal horn of the left lateral ventricle.  Scattered periventricular white  matter change likely reflects small vessel ischemic microangiopathy. The previously noted small infarct at the posterior limb of the right internal capsule is not well characterized.  The posterior fossa, including the cerebellum, brainstem and fourth ventricle, is within normal limits. No mass effect or midline shift is seen.  There is no evidence of fracture; visualized osseous structures are unremarkable in appearance. The orbits are within normal limits. The paranasal sinuses and mastoid air cells are well-aerated. No significant soft tissue abnormalities are seen.  IMPRESSION:  1. No acute intracranial pathology seen to explain the patient's symptoms. 2. Remote infarct again noted at the left frontal lobe, with associated encephalomalacia and mild ex vacuo dilatation of the frontal horn of the left lateral ventricle. 3. Scattered small vessel ischemic microangiopathy.  MRI of the brain 09/2012: IMPRESSION: 1. Acute ischemic infarct involving the posterior limb of the right internal capsule, at or just adjacent to recently identified acute ischemic infarct seen in this region on MRI from 08/26/2012. 2. Stable appearance of additional remote infarcts as detailed Above.  (Reviewed images and agree with above findings.)  MRA of the head 08/2012: MRA HEAD  Findings: Cavernous segment internal carotid artery narrowing and irregularity more notable on the left.  Hypoplastic A1 segment right anterior cerebral artery with narrowing and irregularity suggesting superimposed atherosclerotic type changes.  Mild narrowing M1 segment right middle cerebral artery.  Middle cerebral artery branch vessel mild to moderate irregularity narrowing.  Mild irregularity of  the A2 segment of the anterior cerebral artery bilaterally.  Ectatic vertebral arteries and basilar artery. The left vertebral artery is dominant. Very mild narrowing of the left vertebral artery  after the takeoff of the left PICA. Mild narrowing distal right vertebral artery.  Irregularity and narrowing of the right PICA which is smaller than the left PICA.  Mild irregularity of the basilar artery without high-grade stenosis. Nonvisualization AICA bilaterally.  Moderate narrowing the superior cerebellar artery bilaterally.  Mild narrowing proximal right posterior cerebral artery. P2 segment the posterior cerebral artery mild to moderate irregularity greater on the right. Posterior cerebral artery distal branch vessel narrowing irregularity bilaterally.  No aneurysm or vascular malformation noted.  MRA of the neck: 08/2012:   IMPRESSION: No evidence hemodynamically significant stenosis involving either carotid bifurcation.  Left vertebral artery is dominant. Ectatic proximal left vertebral artery with fold and mild narrowing. Minimal narrowing proximal right vertebral artery.  Cbc 12/2012 with anemia, bmp 12/2012 with gfr 76 (N>90). No recent labs available to review.  2d echo and carotid dopplers 08/2012: Study Conclusions  Left ventricle: The cavity size was normal. Wall thickness was normal. Systolic function was normal. The estimated ejection fraction was in the range of 60% to 65%. Wall motion was normal; there were no regional wall motion abnormalities.   Carotid Summary: - Findings consistent with less than 39 percent stenosis involving the right internal carotid artery and the left internal carotid artery. - Bilateral vertebral artery flow antegrade. Update 11/08/2014 : She returns for follow-up after last visit 6 months ago. She continues to have difficulty walking with stiffness and cramps and pain in the right leg with dragging. She's had balance issues with a few falls recently but fortunately has not hurt herself significantly. She said multiple strokes in the past and has had trouble walking and balance issues following each stroke.  She was seen last by Dr. Daisy Blossom who recommended home physical therapy but this has not started yet. Patient states that time she gets disoriented loses track of times up to 30 minutes this may happen daily while she was watching TV or sitting quietly. She admits to feeling depressed and has been on Celexa 10 mg daily for last few years and has never tried a higher dose. She also takes gabapentin but only 300 mg once a day. She denies tingling numbness and burning in her feet but she did have no conduction study done by Dr. 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 She does have history of diabetes in the past but she has been off medications for a year and a half.   Review of Systems: Patient complains of symptoms per HPI as well as the following symptoms:  Fatigue, activity change, cough, wheezing, memory loss, decreased concentration and. All other systems negative.   Social History   Social History  . Marital Status: Single    Spouse Name: N/A  . Number of Children: 2  . Years of Education: Grad schoo   Occupational History  . Not on file.   Social History Main Topics  . Smoking status: Former Smoker    Quit date: 05/28/2007  . Smokeless tobacco: Not on file  . Alcohol Use: No  . Drug Use: No  . Sexual Activity: Not on file   Other Topics Concern  . Not on file   Social History Narrative   Lives at home by herself.    Daughter come to help at home and  she has a cleaning lady to help twice a week.    Caffeine: Every morning 1-1.5 cups coffee/day, and 12oz tea/day     Family History  Problem Relation Age of Onset  . Stroke Brother   . Stroke Father     Past Medical History  Diagnosis Date  . Stroke   . Hypertension   . Diabetes mellitus     Past Surgical History  Procedure Laterality Date  . Hip arthroplasty Right 11/24/2012    Procedure: RIGHT HIP HEMIARTHROPLASTY;  Surgeon: Verlee Rossetti, MD;   Location: [facility];  Service: Orthopedics;  Laterality: Right;  hemiatroplasty, DePuy Triloc    Current Outpatient Prescriptions  Medication Sig Dispense Refill  . aspirin 325 MG tablet Take 1 tablet (325 mg total) by mouth daily.    Marland Kitchen atorvastatin (LIPITOR) 40 MG tablet Take 1 tablet (40 mg total) by mouth daily at 6 PM. 30 tablet 1  . cetirizine (ZYRTEC) 10 MG tablet Take 10 mg by mouth daily.    . citalopram (CELEXA) 20 MG tablet Take 1 tablet (20 mg total) by mouth daily. 60 tablet 1  . furosemide (LASIX) 20 MG tablet TK 1 T PO  QD PRF LEG SWELLING  0  . gabapentin (NEURONTIN) 300 MG capsule Take 1 capsule (300 mg total) by mouth 3 (three) times daily. 90 capsule 3  . HYDROcodone-acetaminophen (NORCO/VICODIN) 5-325 MG per tablet Take one tablet by mouth every 6 hours as needed moderate pain; Take two tablets by mouth every 6 hours as needed for severe pain 360 tablet 0  . Multiple Vitamin (MULTIVITAMIN WITH MINERALS) TABS Take 1 tablet by mouth daily.    Marland Kitchen olopatadine (PATANOL) 0.1 % ophthalmic solution INSTILL 1 GTT IN OU BID  12  . traZODone (DESYREL) 100 MG tablet     . zolpidem (AMBIEN) 5 MG tablet Take 1 tablet (5 mg total) by mouth at bedtime as needed for sleep. 30 tablet 0   No current facility-administered medications for this visit.    Allergies as of 11/08/2014  . (No Known Allergies)    Vitals: BP 156/84 mmHg  Pulse 81  Ht 5\' 5"  (1.651 m)  Wt 190 lb (86.183 kg)  BMI 31.62 kg/m2 Last Weight:  Wt Readings from Last 1 Encounters:  11/08/14 190 lb (86.183 kg)   Last Height:   Ht Readings from Last 1 Encounters:  11/08/14 5\' 5"  (1.651 m)   Physical exam: Exam: Gen: NAD, conversant, well nourised, obese, well groomed                     CV: RRR, no MRG. No Carotid Bruits. No peripheral edema, warm, nontender Eyes: Conjunctivae clear without exudates or hemorrhage  Neuro: Detailed Neurologic Exam  Speech:    Speech is normal; fluent and spontaneous with normal  comprehension. Geriatric depression scale she scored 11 suggestive of mild to moderate depression Cognition:    The patient is oriented to person, place, and time;     recent and remote memory impaired;     language fluent;     normal attention, concentration,     fund of knowledge appears intact Cranial Nerves: mild masked facies, left NL weak     The pupils are equal, round, and reactive to light. The fundi are flat. Visual fields are full to finger confrontation. Extraocular movements are intact. Trigeminal sensation is intact and the muscles of mastication are normal. The face is symmetric. The palate elevates in the  midline. Hearing intact. Voice is normal. Shoulder shrug is normal. The tongue has normal motion without fasciculations.   Coordination:    Bradykinesia with FTN and finger taps left > right. Cannot do HTS.   Gait:    Dragging the right leg. Wide based. Using a walker. Slow.   Motor Observation: Mild left pronator drift    No asymmetry, no atrophy, and no involuntary movements noted. Tone:    Increase muscle tone. In right lower extremity.    Posture:    Posture is mildly stooped    Strength: Left grip weakness. Diminished fine finger movements on the right. Orbits right over left approximately. Mild weakness of right hip flexors and ankle dorsiflexors.     Sensation: intact to LT     Reflex Exam:  DTR's: Brisk bilaterally Absent patellars Toes:    The toes are equivocal bilaterally.   Clonus:    Clonus is absent. Gait is spastic ataxic gait with stiffness and dragging of the right leg using a walker   Assessment/Plan:  64 year old female with PMHx DM, HTN, infarct left frontal lobe(aca) with associated encephalomalacia,  ischemic infarct involving the posterior limb of the right internal capsule, infarcts involving the left pons, and left globus pallidus. 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  Recent mild cognitive difficulties likely due to suboptimally treated depression.    She is having increasing gait problems and is dragging her right leg likely from spasticity.  I had a long d/w patient about her remote strokes, risk for recurrent stroke/TIAs, personally independently reviewed imaging studies and stroke evaluation results and answered questions.Continue aspirin 325 mg orally every day  for secondary stroke prevention and maintain strict control of hypertension with blood pressure goal below 130/90, diabetes with hemoglobin A1c goal below 6.5% and lipids with LDL cholesterol goal below 100 mg/dL. I also advised the patient  about gait and safety precautions. We will refer the patient for home physical and occupational therapy for gait and safety. Increase gabapentin dose to 300 mg 3 times daily to help his spasticity and increase Celexa dose to 20 mg daily due to suboptimally treated depression.. Check neuropathy panel labs and hemoglobin A1c. Check brain MRI scan to evaluate for new interval stroke. Greater than 50% time of this 25 minute visit was spent on counseling and coordination of care. Return for follow-up in 3 months with Dr. Lucia Gaskins.    Delia Heady, MD  [facility] [address] [place], [place] [address]  Phone [phone] Fax [phone]

## 2014-11-09 ENCOUNTER — Telehealth: Payer: Self-pay | Admitting: Neurology

## 2014-11-09 NOTE — Telephone Encounter (Signed)
Patient's daughter is calling regarding the patient. The patient needs the dosage for gabapentin (NEURONTIN) 300 MG capsule and citalopram (CELEXA) 20 MG tablet. Thank you.

## 2014-11-09 NOTE — Telephone Encounter (Signed)
Daughter called requesting results. Call [phone], leave voicemail if unable to reach.

## 2014-11-09 NOTE — Telephone Encounter (Signed)
I called back to clarify.  They just wanted to make sure the Gabapentin Rx was sent to the pharmacy.  I verified it was sent yesterday.  She expressed understanding, and will call us back if anything further is needed.

## 2014-11-10 NOTE — Telephone Encounter (Signed)
Patients daughter was calling about patients referral for home PT and OT, and Mri. Rn explain that a referral will be sent and pt will get a call for the MRI.

## 2014-11-14 NOTE — Telephone Encounter (Signed)
Talk to patients daughter Evel about her moms blood work. Evel stated her mom will arrange transportation and will try to get the blood work before Friday.

## 2014-11-15 ENCOUNTER — Encounter: Payer: Self-pay | Admitting: *Deleted

## 2014-11-15 NOTE — Progress Notes (Signed)
Patient ID: Sophia Rodriguez, female   DOB: Oct 14, 1950, 64 y.o.   MRN: [id number] RN reviewing placed referral for Ambulatory home visit and noted in the referral documentation from [facility] that the referral was placed in error and should not be addressed by our office. The intent was for the patient to have home Health PT services

## 2014-11-15 NOTE — Telephone Encounter (Signed)
Oops my error I certainly meant PT/OT.Thanks for catching it

## 2014-11-16 ENCOUNTER — Telehealth: Payer: Self-pay

## 2014-11-16 ENCOUNTER — Other Ambulatory Visit: Payer: Self-pay

## 2014-11-16 DIAGNOSIS — R269 Unspecified abnormalities of gait and mobility: Secondary | ICD-10-CM

## 2014-11-16 NOTE — Telephone Encounter (Signed)
     from Donia Pounds            Thank you so much Cheng Dec. I talked with the client yesterday afternoon and we are opening her to care on Friday between 10:30 and 1pm per her request. WHAT a NICE lady. I also talked with her daughter.    Thank you so much for the referral and we look forward to working with your clients and keeping them safe at home!            Patient has been contacted by [facility] for home PT and OT.

## 2014-11-16 NOTE — Telephone Encounter (Signed)
Order is in for referral for occupational therapy,and physical therapy for patient. Interoffice message sent to Sophia Rodriguez at [facility] at [phone], also [phone] office number. Fax number is [phone]

## 2014-11-22 ENCOUNTER — Other Ambulatory Visit (INDEPENDENT_AMBULATORY_CARE_PROVIDER_SITE_OTHER): Payer: Self-pay

## 2014-11-22 DIAGNOSIS — Z0289 Encounter for other administrative examinations: Secondary | ICD-10-CM

## 2014-11-23 LAB — NEUROPATHY PANEL
A/G RATIO SPE: 0.9 (ref 0.7–1.7)
ALBUMIN ELP: 3.5 g/dL (ref 2.9–4.4)
ALPHA 2: 0.9 g/dL (ref 0.4–1.0)
Alpha 1: 0.2 g/dL (ref 0.0–0.4)
Angio Convert Enzyme: 76 U/L (ref 14–82)
Anti Nuclear Antibody(ANA): NEGATIVE
Beta: 1.2 g/dL (ref 0.7–1.3)
Gamma Globulin: 1.6 g/dL (ref 0.4–1.8)
Globulin, Total: 3.9 g/dL (ref 2.2–3.9)
Rhuematoid fact SerPl-aCnc: <10 IU/mL (ref 0.0–13.9)
Sed Rate: 36 mm/hr (ref 0–40)
TSH: 0.774 u[IU]/mL (ref 0.450–4.500)
Total Protein: 7.4 g/dL (ref 6.0–8.5)
VITAMIN B 12: 1741 pg/mL — AB (ref 211–946)
Vit D, 25-Hydroxy: 16.3 ng/mL — ABNORMAL LOW (ref 30.0–100.0)

## 2014-11-23 LAB — HEMOGLOBIN A1C
ESTIMATED AVERAGE GLUCOSE: 169 mg/dL
Hgb A1c MFr Bld: 7.5 % — ABNORMAL HIGH (ref 4.8–5.6)

## 2014-11-23 NOTE — Telephone Encounter (Signed)
Rn talk to [place] at [facility] about the form fax for pt. The form was for a client medication report. Rhonda at [phone] stated the form just needed to be reviewed by MD and just scan in the pts chart. Dr.Sethi did reviewed the form for the pt this am.

## 2014-11-23 NOTE — Telephone Encounter (Signed)
Pt had her lab work done on 11-22-14 at [facility].

## 2014-11-29 ENCOUNTER — Telehealth: Payer: Self-pay | Admitting: Neurology

## 2014-11-29 DIAGNOSIS — I639 Cerebral infarction, unspecified: Secondary | ICD-10-CM

## 2014-11-29 NOTE — Telephone Encounter (Signed)
Sree/Physical Therapist with [facility] [phone], called to request that Speech Therapy be added to order.

## 2014-11-29 NOTE — Telephone Encounter (Signed)
Nurse call Sree(PT) for patient at [facility]. He would like a speech order for patient. Nurse explain that Dr. Pearlean Brownie is currently on vacation, and another Md will have to see the last office note and do the order. Sree was fine with that.

## 2014-11-29 NOTE — Telephone Encounter (Signed)
I ordered through epic (see below). Is that OK?   Marvel Plan, MD PhD Stroke Neurology 11/29/2014 3:26 PM  Orders Placed This Encounter  Procedures  . Ambulatory referral to Speech Therapy    Referral Priority:  Routine    Referral Type:  Speech Therapy    Referral Reason:  Specialty Services Required    Requested Specialty:  Speech Pathology    Number of Visits Requested:  1

## 2014-12-01 ENCOUNTER — Encounter (HOSPITAL_COMMUNITY): Payer: Self-pay | Admitting: Emergency Medicine

## 2014-12-01 ENCOUNTER — Emergency Department (HOSPITAL_COMMUNITY)
Admission: EM | Admit: 2014-12-01 | Discharge: 2014-12-01 | Disposition: A | Discharge status: Home / Self Care | Payer: Medicare Other | Attending: Emergency Medicine | Admitting: Emergency Medicine

## 2014-12-01 ENCOUNTER — Emergency Department (HOSPITAL_COMMUNITY): Payer: Medicare Other

## 2014-12-01 DIAGNOSIS — R7989 Other specified abnormal findings of blood chemistry: Secondary | ICD-10-CM

## 2014-12-01 DIAGNOSIS — Z87891 Personal history of nicotine dependence: Secondary | ICD-10-CM | POA: Insufficient documentation

## 2014-12-01 DIAGNOSIS — Z79899 Other long term (current) drug therapy: Secondary | ICD-10-CM | POA: Insufficient documentation

## 2014-12-01 DIAGNOSIS — I10 Essential (primary) hypertension: Secondary | ICD-10-CM | POA: Diagnosis not present

## 2014-12-01 DIAGNOSIS — Z7982 Long term (current) use of aspirin: Secondary | ICD-10-CM | POA: Insufficient documentation

## 2014-12-01 DIAGNOSIS — R269 Unspecified abnormalities of gait and mobility: Secondary | ICD-10-CM | POA: Diagnosis present

## 2014-12-01 DIAGNOSIS — E119 Type 2 diabetes mellitus without complications: Secondary | ICD-10-CM | POA: Diagnosis not present

## 2014-12-01 DIAGNOSIS — Z8673 Personal history of transient ischemic attack (TIA), and cerebral infarction without residual deficits: Secondary | ICD-10-CM | POA: Insufficient documentation

## 2014-12-01 LAB — DIFFERENTIAL
Basophils Absolute: 0 10*3/uL (ref 0.0–0.1)
Basophils Relative: 0 % (ref 0–1)
EOS ABS: 0.2 10*3/uL (ref 0.0–0.7)
EOS PCT: 3 % (ref 0–5)
LYMPHS PCT: 25 % (ref 12–46)
Lymphs Abs: 1.8 10*3/uL (ref 0.7–4.0)
MONO ABS: 0.4 10*3/uL (ref 0.1–1.0)
Monocytes Relative: 6 % (ref 3–12)
Neutro Abs: 4.7 10*3/uL (ref 1.7–7.7)
Neutrophils Relative %: 66 % (ref 43–77)

## 2014-12-01 LAB — I-STAT CHEM 8, ED
BUN: 15 mg/dL (ref 6–20)
CALCIUM ION: 1.13 mmol/L (ref 1.13–1.30)
Chloride: 105 mmol/L (ref 101–111)
Creatinine, Ser: 1.1 mg/dL — ABNORMAL HIGH (ref 0.44–1.00)
Glucose, Bld: 130 mg/dL — ABNORMAL HIGH (ref 65–99)
HCT: 52 % — ABNORMAL HIGH (ref 36.0–46.0)
Hemoglobin: 17.7 g/dL — ABNORMAL HIGH (ref 12.0–15.0)
Potassium: 3.9 mmol/L (ref 3.5–5.1)
SODIUM: 141 mmol/L (ref 135–145)
TCO2: 22 mmol/L (ref 0–100)

## 2014-12-01 LAB — CBC
HCT: 40.8 % (ref 36.0–46.0)
Hemoglobin: 13.3 g/dL (ref 12.0–15.0)
MCH: 26.4 pg (ref 26.0–34.0)
MCHC: 32.6 g/dL (ref 30.0–36.0)
MCV: 81.1 fL (ref 78.0–100.0)
PLATELETS: 250 10*3/uL (ref 150–400)
RBC: 5.03 MIL/uL (ref 3.87–5.11)
RDW: 14 % (ref 11.5–15.5)
WBC: 7.1 10*3/uL (ref 4.0–10.5)

## 2014-12-01 LAB — I-STAT TROPONIN, ED: Troponin i, poc: 0.01 ng/mL (ref 0.00–0.08)

## 2014-12-01 LAB — COMPREHENSIVE METABOLIC PANEL
ALK PHOS: 100 U/L (ref 38–126)
ALT: 21 U/L (ref 14–54)
ANION GAP: 10 (ref 5–15)
AST: 30 U/L (ref 15–41)
Albumin: 3.9 g/dL (ref 3.5–5.0)
BILIRUBIN TOTAL: 0.5 mg/dL (ref 0.3–1.2)
BUN: 13 mg/dL (ref 6–20)
CALCIUM: 9.9 mg/dL (ref 8.9–10.3)
CO2: 22 mmol/L (ref 22–32)
Chloride: 106 mmol/L (ref 101–111)
Creatinine, Ser: 1.19 mg/dL — ABNORMAL HIGH (ref 0.44–1.00)
GFR calc non Af Amer: 48 mL/min — ABNORMAL LOW (ref >60)
GFR, EST AFRICAN AMERICAN: 55 mL/min — AB (ref >60)
Glucose, Bld: 131 mg/dL — ABNORMAL HIGH (ref 65–99)
POTASSIUM: 4.1 mmol/L (ref 3.5–5.1)
SODIUM: 138 mmol/L (ref 135–145)
TOTAL PROTEIN: 8.1 g/dL (ref 6.5–8.1)

## 2014-12-01 LAB — PROTIME-INR
INR: 1.08 (ref 0.00–1.49)
PROTHROMBIN TIME: 14.2 s (ref 11.6–15.2)

## 2014-12-01 LAB — APTT: aPTT: 34 seconds (ref 24–37)

## 2014-12-01 MED ORDER — LORAZEPAM 2 MG/ML IJ SOLN
1.0000 mg | Freq: Once | INTRAMUSCULAR | Status: AC
Start: 2014-12-01 — End: 2014-12-01
  Administered 2014-12-01: 1 mg via INTRAVENOUS
  Filled 2014-12-01: qty 1

## 2014-12-01 MED ORDER — SODIUM CHLORIDE 0.9 % IV BOLUS (SEPSIS)
1000.0000 mL | Freq: Once | INTRAVENOUS | Status: AC
  Administered 2014-12-01: 1000 mL via INTRAVENOUS

## 2014-12-01 NOTE — Telephone Encounter (Addendum)
Rn call patient about he leg being weaker according to therapist. Rn unable to leave vm.

## 2014-12-01 NOTE — Telephone Encounter (Signed)
Rn call Sree at [phone] therapist for [place]. Rn stated Dr. Roda Shutters put the order in for speech therapy. The order is in epic. Sree stated the verbal order is okay. Sree stated pt is dragging her stroke leg, pt might of had another stoke he is not sure. Rn explain pt needs to go to the Ed if she is experiencing new symptoms.

## 2014-12-01 NOTE — ED Provider Notes (Signed)
CSN: [id number]     Arrival date & time 12/01/14  1713 History   First MD Initiated Contact with Patient 12/01/14 2031     Chief Complaint  Patient presents with  . 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 Patient states that she has noted some gradual worsening weakness in her right leg over the past 3 days. She states that her home physical therapist noted some changes in her gait since the last PT evaluation and recommended that she come to the ED. Patient states that she has no headache at this time. She reports a pressure-like sensation behind her right eye, but this is intermittent and chronic. Patient and family do agree in some gradual weakening of her R side, mostly her R leg. Patient ambulates with a walker at baseline. Patient denies vision changes, new speech changes, N/V, or extremity numbness/parethesias.  The history is provided by the patient. No language interpreter was used.    Past Medical History  Diagnosis Date  . Stroke   . Hypertension   . Diabetes mellitus    Past Surgical History  Procedure Laterality Date  . Hip arthroplasty Right 11/24/2012    Procedure: RIGHT HIP HEMIARTHROPLASTY;  Surgeon: Verlee Rossetti, MD;  Location: [facility];  Service: Orthopedics;  Laterality: Right;  hemiatroplasty, DePuy Triloc   Family History  Problem Relation Age of Onset  . Stroke Brother   . Stroke Father    Social History  Substance Use Topics  . Smoking status: Former Smoker    Quit date: 05/28/2007  . Smokeless tobacco: None  . Alcohol Use: No   OB History    No data available      Review of Systems  Constitutional: Negative for fever.  Eyes: Negative for visual disturbance.   Gastrointestinal: Negative for nausea and vomiting.  Genitourinary:       No incontinence  Musculoskeletal: Positive for gait problem.  Neurological: Positive for weakness (R side, mostly RLE). Negative for syncope.  All other systems reviewed and are negative.   Allergies  Review of patient's allergies indicates no known allergies.  Home Medications   Prior to Admission medications   Medication Sig Start Date End Date Taking? Authorizing Provider  aspirin 325 MG tablet Take 1 tablet (325 mg total) by mouth daily. 10/05/12   Mcarthur Rossetti Angiulli, PA-C  atorvastatin (LIPITOR) 40 MG tablet Take 1 tablet (40 mg total) by mouth daily at 6 PM. 10/05/12   Mcarthur Rossetti Angiulli, PA-C  cetirizine (ZYRTEC) 10 MG tablet Take 10 mg by mouth daily.    Historical Provider, MD  citalopram (CELEXA) 20 MG tablet Take 1 tablet (20 mg total) by mouth daily. 11/08/14   Micki Riley, MD  furosemide (LASIX) 20 MG tablet TK 1 T PO  QD PRF LEG SWELLING 08/31/14   Historical Provider, MD  gabapentin (NEURONTIN) 300 MG capsule Take 1 capsule (300 mg total) by mouth 3 (three) times daily. 11/08/14   Micki Riley, MD  HYDROcodone-acetaminophen (NORCO/VICODIN) 5-325 MG per tablet Take one tablet by mouth every 6 hours as needed moderate pain; Take two tablets by mouth every 6 hours as needed for severe pain 12/06/12   Kermit Balo, DO  Multiple Vitamin (MULTIVITAMIN WITH MINERALS) TABS Take 1 tablet by mouth daily. 10/05/12  Mcarthur Rossetti Angiulli, PA-C  olopatadine (PATANOL) 0.1 % ophthalmic solution INSTILL 1 GTT IN OU BID 08/11/14   Historical Provider, MD  traZODone (DESYREL) 100 MG tablet  08/19/14   Historical Provider, MD  zolpidem (AMBIEN) 5 MG tablet Take 1 tablet (5 mg total) by mouth at bedtime as needed for sleep. 11/26/12   Dorothea Ogle, MD   BP 131/82 mmHg  Pulse 80  Temp(Src) 98.6 F (37 C) (Oral)  Resp 14  Ht 5\' 7"  (1.702 m)  Wt 190 lb (86.183 kg)  BMI 29.75 kg/m2  SpO2 96%   Physical Exam   Constitutional: She is oriented to person, place, and time. She appears well-developed and well-nourished. No distress.  Nontoxic/nonseptic appearing  HENT:  Head: Normocephalic and atraumatic.  Symmetric rise of the uvula with phonation.  Eyes: Conjunctivae and EOM are normal. Pupils are equal, round, and reactive to light. No scleral icterus.  Normal EOMs.  Neck: Normal range of motion.  No nuchal rigidity or meningismus  Cardiovascular: Normal rate, regular rhythm and intact distal pulses.   Pulmonary/Chest: Effort normal. No respiratory distress.  Respirations even and unlabored  Musculoskeletal: Normal range of motion.  Neurological: She is alert and oriented to person, place, and time. Coordination normal.  GCS 15. Speech is "oriented. No cranial nerve deficits appreciated; no facial drooping, tongue midline. Patient has 4/5 grip strength on the right with 5/5 grips on the left. Strength against resistance 4/5 in the RUE and 5/5 in the LUE. Strength 2/5 in the RLE and 3-4/5 in the LLE. Sensation to light touch intact in all extremities. No ataxia noted.  Skin: Skin is warm and dry. No rash noted. She is not diaphoretic. No erythema. No pallor.  Psychiatric: She has a normal mood and affect. Her behavior is normal.  Nursing note and vitals reviewed.   ED Course  Procedures (including critical care time) Labs Review Labs Reviewed  COMPREHENSIVE METABOLIC PANEL - Abnormal; Notable for the following:    Glucose, Bld 131 (*)    Creatinine, Ser 1.19 (*)    GFR calc non Af Amer 48 (*)    GFR calc Af Amer 55 (*)    All other components within normal limits  I-STAT CHEM 8, ED - Abnormal; Notable for the following:    Creatinine, Ser 1.10 (*)    Glucose, Bld 130 (*)    Hemoglobin 17.7 (*)    HCT 52.0 (*)    All other components within normal limits  PROTIME-INR  APTT  CBC  DIFFERENTIAL  I-STAT TROPOININ, ED    Imaging Review Ct Head Wo Contrast  12/01/2014   CLINICAL DATA:   64 year old female with history of stroke presenting with change in gait.  EXAM: CT HEAD WITHOUT CONTRAST  TECHNIQUE: Contiguous axial images were obtained from the base of the skull through the vertex without intravenous contrast.  COMPARISON:  CT dated 12/26/2012  FINDINGS: The ventricles and sulci are appropriate in size for patient's age. Mild periventricular and deep white matter hypodensities represent chronic microvascular ischemic changes. Left frontal horn infarct and encephalomalacia changes similar to prior study. There is stable ex vacuo dilatation of the left lateral ventricle. There is no intracranial hemorrhage. No mass effect or midline shift identified.  The visualized paranasal sinuses and mastoid air cells are well aerated. The calvarium is intact.  IMPRESSION: No acute intracranial pathology.  Mild age-related atrophy and chronic microvascular ischemic disease. Stable left frontal old infarct.  If symptoms persist and there  are no contraindications, MRI may provide better evaluation if clinically indicated   Electronically Signed   By: Elgie Collard M.D.   On: 12/01/2014 19:01   Mr Brain Wo Contrast  12/01/2014   CLINICAL DATA:  Initial evaluation for acute change and gait.  EXAM: MRI HEAD WITHOUT CONTRAST  TECHNIQUE: Multiplanar, multiecho pulse sequences of the brain and surrounding structures were obtained without intravenous contrast.  COMPARISON:  Prior CT from earlier the same day as well as previous MRI from 09/21/2012.  FINDINGS: Diffuse prominence of the CSF containing spaces is compatible with generalized cerebral atrophy. Patchy and confluent T2/FLAIR hyperintensity within the periventricular and deep white matter is most consistent with chronic small vessel ischemic changes. Encephalomalacia within the parasagittal left frontal lobe most compatible with remote left ACA territory infarct. Infarct involves the body of the corpus callosum and cingulate. Additional scattered remote  lacunar infarcts within the bilateral basal ganglia, pons, and right cerebellar hemisphere. Remote infarct within the right thalamus as well. Infarcts within the right thalamus and right cerebellum appear new from previous.  No abnormal foci of restricted diffusion to suggest acute intracranial infarct. Gray-white matter differentiation maintained. Normal intravascular flow voids are preserved. No acute intracranial hemorrhage.  No mass lesion, midline shift, or mass effect. Mild ex vacuo dilatation of the left lateral ventricle related to the left frontal lobe infarct. No hydrocephalus. No extra-axial fluid collection.  Craniocervical junction within normal limits. Scattered multilevel degenerative changes present within the visualized upper cervical spine without significant stenosis. Pituitary gland normal.  No acute abnormality about the orbits.  Retention cyst noted within the right maxillary sinus. Scattered mucosal thickening within the ethmoidal air cells. Paranasal sinuses are otherwise clear. No mastoid effusion. Inner ear structures grossly normal.  Bone marrow signal intensity within normal limits. Scalp soft tissues unremarkable.  IMPRESSION: 1. No acute intracranial infarct or other process identified. 2. Generalized cerebral atrophy with chronic microvascular ischemic disease and multiple remote infarcts as above.   Electronically Signed   By: Rise Mu M.D.   On: 12/01/2014 22:49   I have personally reviewed and evaluated these images and lab results as part of my medical decision-making.   EKG Interpretation   Date/Time:  Friday December 01 2014 17:46:53 EDT Ventricular Rate:  94 PR Interval:  172 QRS Duration: 72 QT Interval:  358 QTC Calculation: 447 R Axis:   41 Text Interpretation:  Normal sinus rhythm Low voltage QRS Borderline ECG  Confirmed by Lincoln Brigham [phone]) on 12/01/2014 11:09:18 PM       Medications  sodium chloride 0.9 % bolus 1,000 mL (1,000 mLs  Intravenous New Bag/Given 12/01/14 2118)  LORazepam (ATIVAN) injection 1 mg (1 mg Intravenous Given 12/01/14 2130)    MDM   Final diagnoses:  Gait difficulty  Elevated serum creatinine    64 year old female with a history of CVA and residual right-sided deficits presents to the emergency department complaining of worsening gait. She reports difficulty walking and using her right leg which is new and gradually worsening 3 days. Right-sided deficits noted on his exam. No notable cranial nerve deficits. Laboratory workup is noncontributory other than slight worsening of creatinine function from baseline. Patient hydrated in emergency department. Have advised repeat labs in 1 week with PCP follow up for this. Patient with no acute findings on CT scan. MRI was pursued to evaluate for acute/subacute stroke. MRI showed no acute intracranial infarct or other process today.  Given reassuring workup, no indication for further emergent testing  at this time. Patient has been advised to follow-up with her neurologist for further evaluation of her right leg weakness. No red flags or signs concerning for cauda equina. Patient stable for discharge; return precautions provided. Patient seen and evaluated, also, by my attending who is in agreement with this workup, assessment, management plan, and patient's stability for discharge.   Filed Vitals:   12/01/14 1737 12/01/14 2117 12/01/14 2249  BP: 191/98 153/80 131/82  Pulse: 88 78 80  Temp: 98.6 F (37 C)    TempSrc: Oral    Resp: 16 14   Height: 5\' 7"  (1.702 m)    Weight: 190 lb (86.183 kg)    SpO2: 99% 100% 96%     Antony Madura, PA-C 12/01/14 2320  Tilden Fossa, MD 12/02/14 1559

## 2014-12-01 NOTE — ED Notes (Signed)
Pt off unit with MRI 

## 2014-12-01 NOTE — Discharge Instructions (Signed)
Follow up with your neurologist for further evaluation of your symptoms. Your MRI today does not show any new stroke. Discuss canceling your scheduled MRI for 12/09/14 with your neurologist seeing that you had one completed in the ED today. Follow up with your primary care doctor for a recheck of your kidney function in 1 week. Return to the ED, as needed, if symptoms worsen.  Fall Prevention and Home Safety Falls cause injuries and can affect all age groups. It is possible to use preventive measures to significantly decrease the likelihood of falls. There are many simple measures which can make your home safer and prevent falls. OUTDOORS  Repair cracks and edges of walkways and driveways.  Remove high doorway thresholds.  Trim shrubbery on the main path into your home.  Have good outside lighting.  Clear walkways of tools, rocks, debris, and clutter.  Check that handrails are not broken and are securely fastened. Both sides of steps should have handrails.  Have leaves, snow, and ice cleared regularly.  Use sand or salt on walkways during winter months.  In the garage, clean up grease or oil spills. BATHROOM  Install night lights.  Install grab bars by the toilet and in the tub and shower.  Use non-skid mats or decals in the tub or shower.  Place a plastic non-slip stool in the shower to sit on, if needed.  Keep floors dry and clean up all water on the floor immediately.  Remove soap buildup in the tub or shower on a regular basis.  Secure bath mats with non-slip, double-sided rug tape.  Remove throw rugs and tripping hazards from the floors. BEDROOMS  Install night lights.  Make sure a bedside light is easy to reach.  Do not use oversized bedding.  Keep a telephone by your bedside.  Have a firm chair with side arms to use for getting dressed.  Remove throw rugs and tripping hazards from the floor. KITCHEN  Keep handles on pots and pans turned toward the center  of the stove. Use back burners when possible.  Clean up spills quickly and allow time for drying.  Avoid walking on wet floors.  Avoid hot utensils and knives.  Position shelves so they are not too high or low.  Place commonly used objects within easy reach.  If necessary, use a sturdy step stool with a grab bar when reaching.  Keep electrical cables out of the way.  Do not use floor polish or wax that makes floors slippery. If you must use wax, use non-skid floor wax.  Remove throw rugs and tripping hazards from the floor. STAIRWAYS  Never leave objects on stairs.  Place handrails on both sides of stairways and use them. Fix any loose handrails. Make sure handrails on both sides of the stairways are as long as the stairs.  Check carpeting to make sure it is firmly attached along stairs. Make repairs to worn or loose carpet promptly.  Avoid placing throw rugs at the top or bottom of stairways, or properly secure the rug with carpet tape to prevent slippage. Get rid of throw rugs, if possible.  Have an electrician put in a light switch at the top and bottom of the stairs. OTHER FALL PREVENTION TIPS  Wear low-heel or rubber-soled shoes that are supportive and fit well. Wear closed toe shoes.  When using a stepladder, make sure it is fully opened and both spreaders are firmly locked. Do not climb a closed stepladder.  Add color or contrast  paint or tape to grab bars and handrails in your home. Place contrasting color strips on first and last steps.  Learn and use mobility aids as needed. Install an electrical emergency response system.  Turn on lights to avoid dark areas. Replace light bulbs that burn out immediately. Get light switches that glow.  Arrange furniture to create clear pathways. Keep furniture in the same place.  Firmly attach carpet with non-skid or double-sided tape.  Eliminate uneven floor surfaces.  Select a carpet pattern that does not visually hide the  edge of steps.  Be aware of all pets. OTHER HOME SAFETY TIPS  Set the water temperature for 120 F (48.8 C).  Keep emergency numbers on or near the telephone.  Keep smoke detectors on every level of the home and near sleeping areas. Document Released: 02/28/2002 Document Revised: 09/09/2011 Document Reviewed: 05/30/2011 [facility] Patient Information 2015 [place], [place]. This information is not intended to replace advice given to you by your health care provider. Make sure you discuss any questions you have with your health care provider.  Dehydration, Adult Dehydration is when you lose more fluids from the body than you take in. Vital organs like the kidneys, brain, and heart cannot function without a proper amount of fluids and salt. Any loss of fluids from the body can cause dehydration.  CAUSES   Vomiting.  Diarrhea.  Excessive sweating.  Excessive urine output.  Fever. SYMPTOMS  Mild dehydration  Thirst.  Dry lips.  Slightly dry mouth. Moderate dehydration  Very dry mouth.  Sunken eyes.  Skin does not bounce back quickly when lightly pinched and released.  Dark urine and decreased urine production.  Decreased tear production.  Headache. Severe dehydration  Very dry mouth.  Extreme thirst.  Rapid, weak pulse (more than 100 beats per minute at rest).  Cold hands and feet.  Not able to sweat in spite of heat and temperature.  Rapid breathing.  Blue lips.  Confusion and lethargy.  Difficulty being awakened.  Minimal urine production.  No tears. DIAGNOSIS  Your caregiver will diagnose dehydration based on your symptoms and your exam. Blood and urine tests will help confirm the diagnosis. The diagnostic evaluation should also identify the cause of dehydration. TREATMENT  Treatment of mild or moderate dehydration can often be done at home by increasing the amount of fluids that you drink. It is best to drink small amounts of fluid more often.  Drinking too much at one time can make vomiting worse. Refer to the home care instructions below. Severe dehydration needs to be treated at the hospital where you will probably be given intravenous (IV) fluids that contain water and electrolytes. HOME CARE INSTRUCTIONS   Ask your caregiver about specific rehydration instructions.  Drink enough fluids to keep your urine clear or pale yellow.  Drink small amounts frequently if you have nausea and vomiting.  Eat as you normally do.  Avoid:  Foods or drinks high in sugar.  Carbonated drinks.  Juice.  Extremely hot or cold fluids.  Drinks with caffeine.  Fatty, greasy foods.  Alcohol.  Tobacco.  Overeating.  Gelatin desserts.  Wash your hands well to avoid spreading bacteria and viruses.  Only take over-the-counter or prescription medicines for pain, discomfort, or fever as directed by your caregiver.  Ask your caregiver if you should continue all prescribed and over-the-counter medicines.  Keep all follow-up appointments with your caregiver. SEEK MEDICAL CARE IF:  You have abdominal pain and it increases or stays in one  area (localizes).  You have a rash, stiff neck, or severe headache.  You are irritable, sleepy, or difficult to awaken.  You are weak, dizzy, or extremely thirsty. SEEK IMMEDIATE MEDICAL CARE IF:   You are unable to keep fluids down or you get worse despite treatment.  You have frequent episodes of vomiting or diarrhea.  You have blood or green matter (bile) in your vomit.  You have blood in your stool or your stool looks black and tarry.  You have not urinated in 6 to 8 hours, or you have only urinated a small amount of very dark urine.  You have a fever.  You faint. MAKE SURE YOU:   Understand these instructions.  Will watch your condition.  Will get help right away if you are not doing well or get worse. Document Released: 03/10/2005 Document Revised: 06/02/2011 Document Reviewed:  10/28/2010 [facility] Patient Information 2015 [place], [place]. This information is not intended to replace advice given to you by your health care provider. Make sure you discuss any questions you have with your health care provider.

## 2014-12-01 NOTE — ED Notes (Signed)
Pt from home for eval of change in gait, per family and Home PT, pt is having a difficult time walking and using right leg. Pt states hx of stroke and has right sided deficits from that. Pt weaker on right side but family states this is baseline. Pt LSW on 11/29/14. nad noted.

## 2014-12-09 ENCOUNTER — Other Ambulatory Visit: Payer: Medicare Other

## 2015-01-16 ENCOUNTER — Other Ambulatory Visit (HOSPITAL_COMMUNITY): Payer: Self-pay | Admitting: Internal Medicine

## 2015-01-16 DIAGNOSIS — R1310 Dysphagia, unspecified: Secondary | ICD-10-CM

## 2015-01-24 ENCOUNTER — Ambulatory Visit (HOSPITAL_COMMUNITY)
Admission: RE | Admit: 2015-01-24 | Discharge: 2015-01-24 | Disposition: A | Discharge status: Home / Self Care | Payer: Medicare Other | Source: Ambulatory Visit | Attending: Internal Medicine | Admitting: Internal Medicine

## 2015-01-24 DIAGNOSIS — R1310 Dysphagia, unspecified: Secondary | ICD-10-CM

## 2015-01-24 DIAGNOSIS — Z8673 Personal history of transient ischemic attack (TIA), and cerebral infarction without residual deficits: Secondary | ICD-10-CM | POA: Insufficient documentation

## 2015-02-12 ENCOUNTER — Encounter: Payer: Self-pay | Admitting: Neurology

## 2015-02-12 ENCOUNTER — Ambulatory Visit (INDEPENDENT_AMBULATORY_CARE_PROVIDER_SITE_OTHER): Payer: Medicare Other | Admitting: Neurology

## 2015-02-12 VITALS — BP 132/91 | HR 92 | Wt 189.8 lb

## 2015-02-12 DIAGNOSIS — I639 Cerebral infarction, unspecified: Secondary | ICD-10-CM | POA: Diagnosis not present

## 2015-02-12 DIAGNOSIS — G8111 Spastic hemiplegia affecting right dominant side: Secondary | ICD-10-CM

## 2015-02-12 NOTE — Patient Instructions (Signed)
Remember to drink plenty of fluid, eat healthy meals and do not skip any meals. Try to eat protein with a every meal and eat a healthy snack such as fruit or nuts in between meals. Try to keep a regular sleep-wake schedule and try to exercise daily, particularly in the form of walking, 20-30 minutes a day, if you can.   As far as your medications are concerned, I would like to suggest: continue current medications  As far as diagnostic testing: [facility]  I would like to see you back in 4 months with [facility], sooner if we need to. Please call us with any interim questions, concerns, problems, updates or refill requests.   Our phone number is [phone]. We also have an after hours call service for urgent matters and there is a physician on-call for urgent questions. For any emergencies you know to call 911 or go to the nearest emergency room

## 2015-02-12 NOTE — Progress Notes (Signed)
[facility]   CC: Stroke   Interval history:  She does not feel that she got any better with PT. She is still having difficulty walking. She is dragging the right leg. She has stiffness and cramps in the right leg. No falls recently. She is using her walker all the time. She takes neurontin in the morning and night. Her depression comes and goes but she is feeling better and Dr. Pearlean Brownie increased her celexa. She has a right foot orthosis but doesn't wear it. Will refer to kirsteins and swartz.   MRi brain 12/01/2014:  FINDINGS: Diffuse prominence of the CSF containing spaces is compatible with generalized cerebral atrophy. Patchy and confluent T2/FLAIR hyperintensity within the periventricular and deep white matter is most consistent with chronic small vessel ischemic changes. Encephalomalacia within the parasagittal left frontal lobe most compatible with remote left ACA territory infarct. Infarct involves the body of the corpus callosum and cingulate. Additional scattered remote lacunar infarcts within the bilateral basal ganglia, pons, and right cerebellar hemisphere. Remote infarct within the right thalamus as well. Infarcts within the right thalamus and right cerebellum appear new from previous.  No abnormal foci of restricted diffusion to suggest acute intracranial infarct. Gray-white matter differentiation maintained. Normal intravascular flow voids are preserved. No acute intracranial hemorrhage.  No mass lesion, midline shift, or mass effect. Mild ex vacuo dilatation of the left lateral ventricle related to the left frontal lobe infarct. No hydrocephalus. No extra-axial fluid collection.  Craniocervical junction within normal limits. Scattered multilevel degenerative changes present within the visualized upper cervical spine without significant stenosis. Pituitary gland normal.  No acute abnormality about the orbits.  Retention cyst noted within the right maxillary  sinus. Scattered mucosal thickening within the ethmoidal air cells. Paranasal sinuses are otherwise clear. No mastoid effusion. Inner ear structures grossly normal.  Bone marrow signal intensity within normal limits. Scalp soft tissues unremarkable.  IMPRESSION: 1. No acute intracranial infarct or other process identified. 2. Generalized cerebral atrophy with chronic microvascular ischemic disease and multiple remote infarcts as above.  Update 11/08/2014 Dr. Pearlean Brownie : She returns for follow-up after last visit 6 months ago. She continues to have difficulty walking with stiffness and cramps and pain in the right leg with dragging. She's had balance issues with a few falls recently but fortunately has not hurt herself significantly. She said multiple strokes in the past and has had trouble walking and balance issues following each stroke. She was seen last by Dr. Daisy Blossom who recommended home physical therapy but this has not started yet. Patient states that time she gets disoriented loses track of times up to 30 minutes this may happen daily while she was watching TV or sitting quietly. She admits to feeling depressed and has been on Celexa 10 mg daily for last few years and has never tried a higher dose. She also takes gabapentin but only 300 mg once a day. She denies tingling numbness and burning in her feet but she did have no conduction study done by Dr. 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 She does have history of diabetes in the past but she has been off medications for a year and a half.   HPI: Sophia Rodriguez is a 64 y.o. female here as a referral from Dr. Concepcion Elk for stroke. She has a PMHx of HTN, DM. 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 First stroke 5 years ago. Had 2 a few years ago. She is  compliant with medications. Her gait has worsened. She used to use a cane and now she is using a walker. She can't get around like  she used to. She feels weak. She is having word-finding difficulty. Sometimes when she is laying in bed it hurts in her rightear, no dizziness or room spinning. It just hurts. She can't sleep with her right arm extended because the arm goes numb. (Very poor historian, psychomotor slowing). She can't close her right hand all the way, some numbness in the fingers. She has difficulty using right arm. Sometimes her left hand "goes and goes and goes" and she can't stop it. She has to stop herself by touching it, if she makes a concerted effort she can stop her hand. She has neck pain and when she moves it a certain way she can feel pain but no radicular symptoms. She lives alone in her apartment. She has a cleaning person, daughter helps with medications. She babysits her grandaughter every day, she is 8. No falls. She is on aspirin for stroke prevention, reports she was not on aspirin during her last strokes. She has a primary care physician that she likes very much and manages her medications, cholesterol, glucose and blood pressure.   Reviewed notes, labs and imaging from outside physicians, which showed:   CT of the head 12/2012: A remote infarct is again noted at the left frontal lobe, with associated encephalomalacia. This is grossly unchanged in appearance. There is mild ex vacuo dilatation of the frontal horn of the left lateral ventricle.  Scattered periventricular white matter change likely reflects small vessel ischemic microangiopathy. The previously noted small infarct at the posterior limb of the right internal capsule is not well characterized.  The posterior fossa, including the cerebellum, brainstem and fourth ventricle, is within normal limits. No mass effect or midline shift is seen.  There is no evidence of fracture; visualized osseous structures are unremarkable in appearance. The orbits are within normal limits. The paranasal sinuses and mastoid air cells are well-aerated.  No significant soft tissue abnormalities are seen.  IMPRESSION:  1. No acute intracranial pathology seen to explain the patient's symptoms. 2. Remote infarct again noted at the left frontal lobe, with associated encephalomalacia and mild ex vacuo dilatation of the frontal horn of the left lateral ventricle. 3. Scattered small vessel ischemic microangiopathy.  MRI of the brain 09/2012: IMPRESSION: 1. Acute ischemic infarct involving the posterior limb of the right internal capsule, at or just adjacent to recently identified acute ischemic infarct seen in this region on MRI from 08/26/2012. 2. Stable appearance of additional remote infarcts as detailed Above.  (Reviewed images and agree with above findings.)  MRA of the head 08/2012: MRA HEAD  Findings: Cavernous segment internal carotid artery narrowing and irregularity more notable on the left.  Hypoplastic A1 segment right anterior cerebral artery with narrowing and irregularity suggesting superimposed atherosclerotic type changes.  Mild narrowing M1 segment right middle cerebral artery.  Middle cerebral artery branch vessel mild to moderate irregularity narrowing.  Mild irregularity of the A2 segment of the anterior cerebral artery bilaterally.  Ectatic vertebral arteries and basilar artery. The left vertebral artery is dominant. Very mild narrowing of the left vertebral artery after the takeoff of the left PICA. Mild narrowing distal right vertebral artery.  Irregularity and narrowing of the right PICA which is smaller than the left PICA.  Mild irregularity of the basilar artery without high-grade stenosis. Nonvisualization AICA bilaterally.  Moderate narrowing the superior cerebellar artery  bilaterally.  Mild narrowing proximal right posterior cerebral artery. P2 segment the posterior cerebral artery mild to moderate irregularity greater on the right. Posterior cerebral artery distal branch vessel narrowing  irregularity bilaterally.  No aneurysm or vascular malformation noted.  MRA of the neck: 08/2012:   IMPRESSION: No evidence hemodynamically significant stenosis involving either carotid bifurcation.  Left vertebral artery is dominant. Ectatic proximal left vertebral artery with fold and mild narrowing. Minimal narrowing proximal right vertebral artery.  Cbc 12/2012 with anemia, bmp 12/2012 with gfr 76 (N>90). No recent labs available to review.  2d echo and carotid dopplers 08/2012: Study Conclusions  Left ventricle: The cavity size was normal. Wall thickness was normal. Systolic function was normal. The estimated ejection fraction was in the range of 60% to 65%. Wall motion was normal; there were no regional wall motion abnormalities.   Carotid Summary: - Findings consistent with less than 39 percent stenosis involving the right internal carotid artery and the left internal carotid artery. - Bilateral vertebral artery flow antegrade.  Review of Systems: Patient complains of symptoms per HPI as well as the following symptoms: activity change, eye redness. No CP, no SOB  Pertinent negatives per HPI. All others negative.  Social History   Social History  . Marital Status: Single    Spouse Name: N/A  . Number of Children: 2  . Years of Education: Grad schoo   Occupational History  . Not on file.   Social History Main Topics  . Smoking status: Former Smoker    Quit date: 05/28/2007  . Smokeless tobacco: Not on file  . Alcohol Use: No  . Drug Use: No  . Sexual Activity: Not on file   Other Topics Concern  . Not on file   Social History Narrative   Lives at home by herself.    Daughter come to help at home and she has a cleaning lady to help twice a week.    Caffeine: Every morning 1-1.5 cups coffee/day, and 12oz tea/day     Family History  Problem Relation Age of Onset  . Stroke Brother   . Stroke Father     Past Medical History  Diagnosis Date  .  Stroke (HCC)   . Hypertension   . Diabetes mellitus     Past Surgical History  Procedure Laterality Date  . Hip arthroplasty Right 11/24/2012    Procedure: RIGHT HIP HEMIARTHROPLASTY;  Surgeon: Verlee Rossetti, MD;  Location: [facility];  Service: Orthopedics;  Laterality: Right;  hemiatroplasty, DePuy Triloc    Current Outpatient Prescriptions  Medication Sig Dispense Refill  . aspirin 325 MG tablet Take 1 tablet (325 mg total) by mouth daily.    Marland Kitchen atorvastatin (LIPITOR) 40 MG tablet Take 1 tablet (40 mg total) by mouth daily at 6 PM. 30 tablet 1  . cetirizine (ZYRTEC) 10 MG tablet Take 10 mg by mouth daily.    . citalopram (CELEXA) 10 MG tablet Take 10 mg by mouth daily.    . citalopram (CELEXA) 20 MG tablet Take 1 tablet (20 mg total) by mouth daily. 60 tablet 1  . furosemide (LASIX) 20 MG tablet TK 1 T PO  QD PRF LEG SWELLING  0  . gabapentin (NEURONTIN) 300 MG capsule Take 1 capsule (300 mg total) by mouth 3 (three) times daily. 90 capsule 3  . lisinopril (PRINIVIL,ZESTRIL) 10 MG tablet Take 10 mg by mouth daily.    . Multiple Vitamin (MULTIVITAMIN WITH MINERALS) TABS Take 1 tablet by mouth  daily.    . olopatadine (PATANOL) 0.1 % ophthalmic solution INSTILL 1 GTT IN OU BID  12  . traZODone (DESYREL) 100 MG tablet     . Vitamin D, Ergocalciferol, (DRISDOL) 50000 UNITS CAPS capsule Take 50,000 Units by mouth daily.    . VOLTAREN 1 % GEL Apply 1 application topically as needed.  5  . zolpidem (AMBIEN) 10 MG tablet Take 10 mg by mouth daily.  5  . zolpidem (AMBIEN) 5 MG tablet Take 1 tablet (5 mg total) by mouth at bedtime as needed for sleep. 30 tablet 0   No current facility-administered medications for this visit.    Allergies as of 02/12/2015  . (No Known Allergies)    Vitals: BP 132/91 mmHg  Pulse 92  Wt 189 lb 12.8 oz (86.093 kg) Last Weight:  Wt Readings from Last 1 Encounters:  02/12/15 189 lb 12.8 oz (86.093 kg)   Last Height:   Ht Readings from Last 1 Encounters:    12/01/14  (1.702 m)     Cognition:  The patient is oriented to person, place, and time;   Cranial Nerves: mild masked facies, left NL flatt?   The pupils are equal, round, and reactive to light.  Visual fields are full to finger confrontation. Extraocular movements are intact. Trigeminal sensation is intact and the muscles of mastication are normal. The face is symmetric. The palate elevates in the midline. Hearing intact. Voice is normal. Shoulder shrug is normal. The tongue has normal motion without fasciculations.   Coordination:  Bradykinesia with FTN and finger taps left > right. Cannot do HTS.   Gait:  Dragging the right leg. Wide based. Using a walker. Slow. Spastic gait.  Motor Observation: Mild left pronator drift  No asymmetry, no atrophy, and no involuntary movements noted. Tone:  increased tone right leg  Posture:  Posture is mildly stooped   Strength: right biceps femoris, right dorsiflexion weakness and hip flexor weakness. Right deltoid and triceps weakness.    Sensation: intact to LT   Reflex Exam:  DTR's: Absent patellars. Brisk otherwise Toes:  The toes are equivocal bilaterally.  Clonus:  Clonus is absent.    Assessment/Plan: 64 year old female with PMHx DM, HTN, infarct left frontal lobe(aca) with associated encephalomalacia, ischemic infarct involving the posterior limb of the right internal capsule, infarcts involving the left pons, and left globus pallidus. She has right-sided weakness from left frontal stroke and left-sided weakness from right thalamic and right PLIC infarcts. She is having increasing gait problems and is dragging her right leg.  She has been on ASA  since being discharged from stroke service in 2014. 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    -Repeat MRI of the brain stable -Continue ASA 325 orally every day for secondary stroke prevention and maintain strict  control of hypertension with blood pressure goal below 130/90, diabetes with hemoglobin A1c goal below 6.5% and lipids with LDL cholesterol goal below 70 mg/dL.  - will refer to Drs. Lavella Hammock for rehab right spastic hemiparesis - fall risk, advised to always use walking aids and wear her right foot orthosis - HgbA1c 7.5 needs f/u with pcp for management - f/u 6 months with NP  Naomie Dean, MD  [facility] [address] [place], [place] [address]  Phone [phone] Fax [phone]  A total of 30 minutes was spent face-to-face with this patient. Over half this time was spent on counseling patient on the  right spastic hemiparesis diagnosis and different diagnostic and therapeutic options available.

## 2015-05-09 ENCOUNTER — Encounter: Payer: Medicare Other | Attending: Physical Medicine & Rehabilitation | Admitting: Physical Medicine & Rehabilitation

## 2015-05-14 ENCOUNTER — Ambulatory Visit: Payer: Medicare Other | Admitting: Adult Health

## 2015-05-30 ENCOUNTER — Ambulatory Visit: Payer: Medicare Other | Admitting: Adult Health

## 2015-06-09 IMAGING — CR DG CHEST 2V
2 series · 2 of 2 positions shown · non-contrast
Comparison: 09/22/2012.

CLINICAL DATA: Shortness of breath.

CHEST - 2 VIEW

[w chest lat]
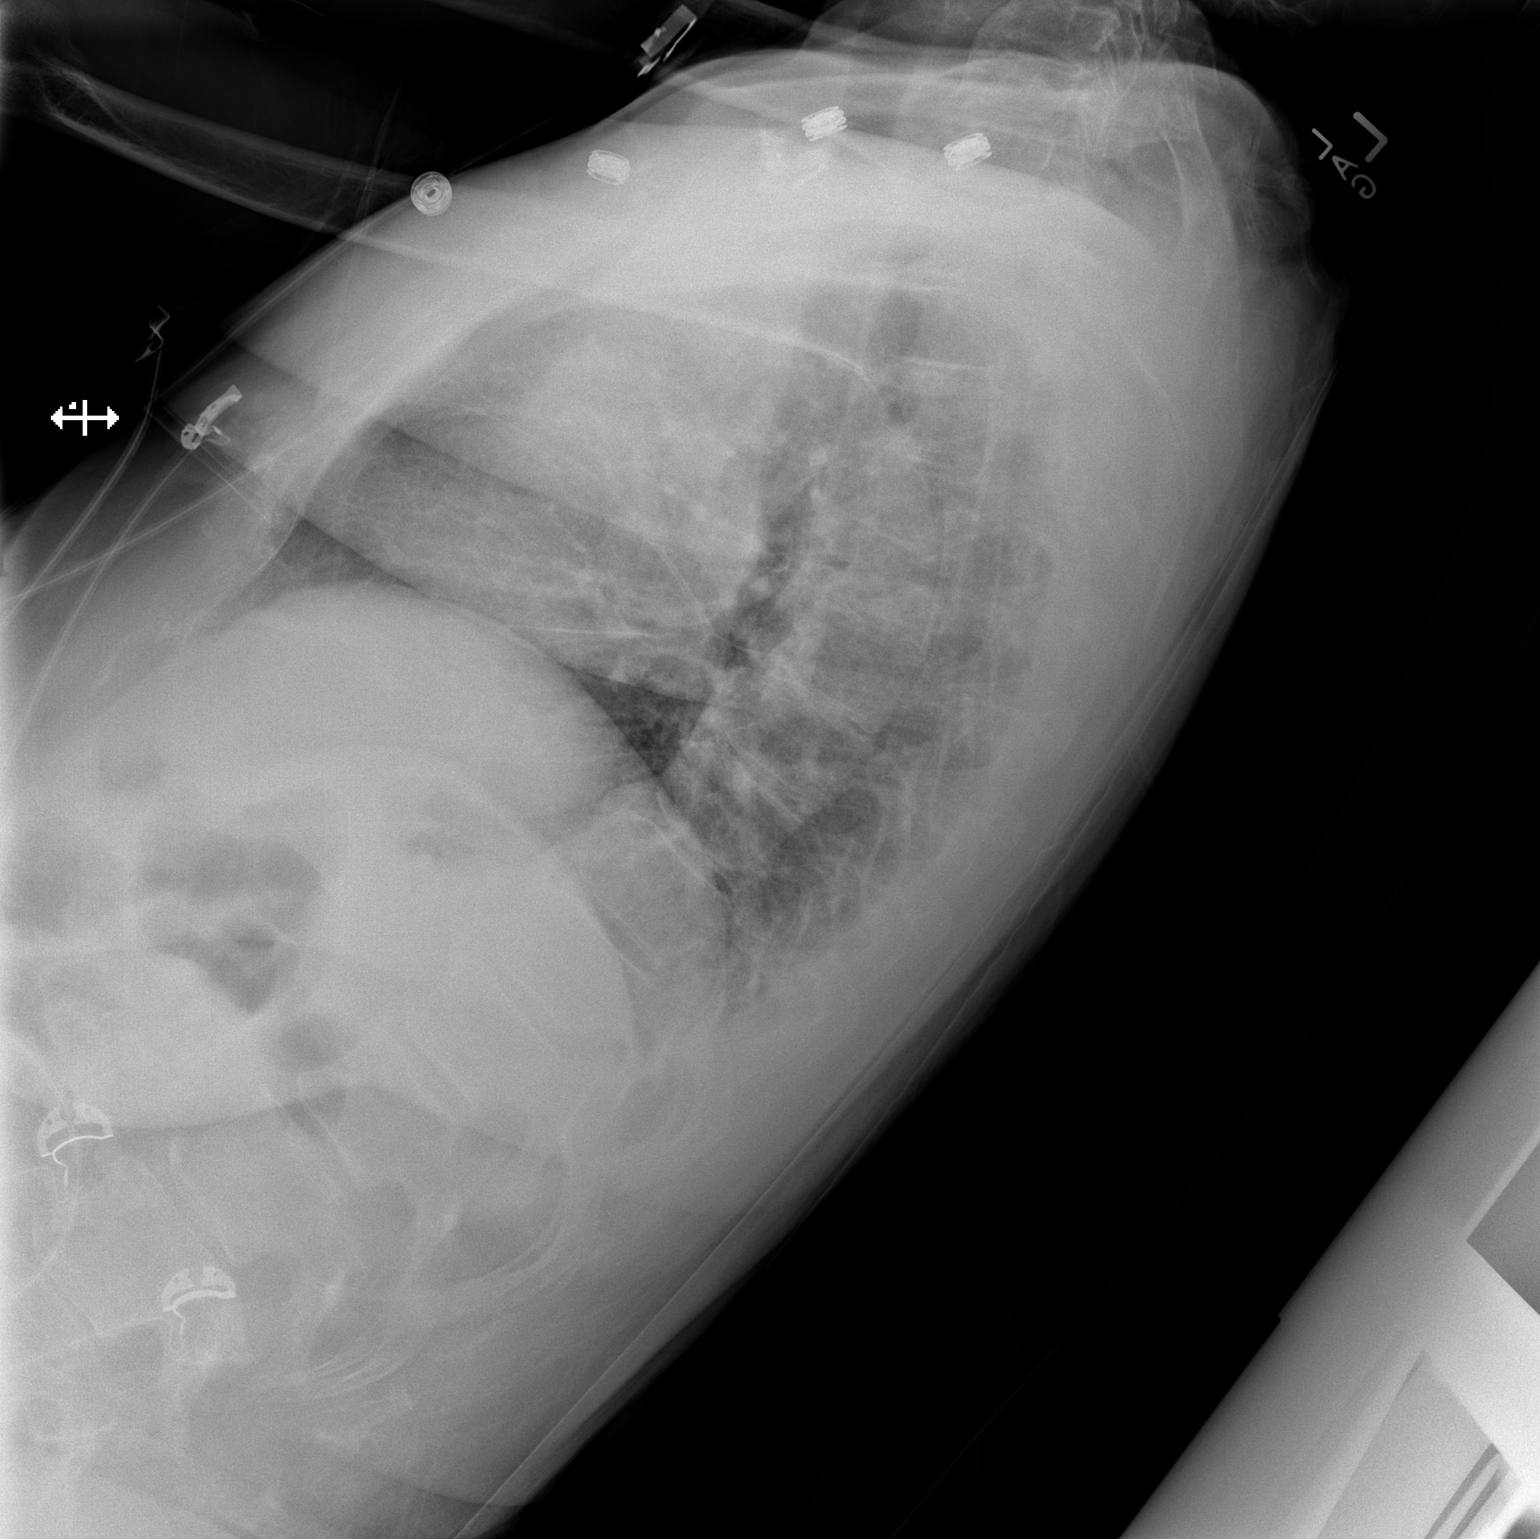

[x chest ap]
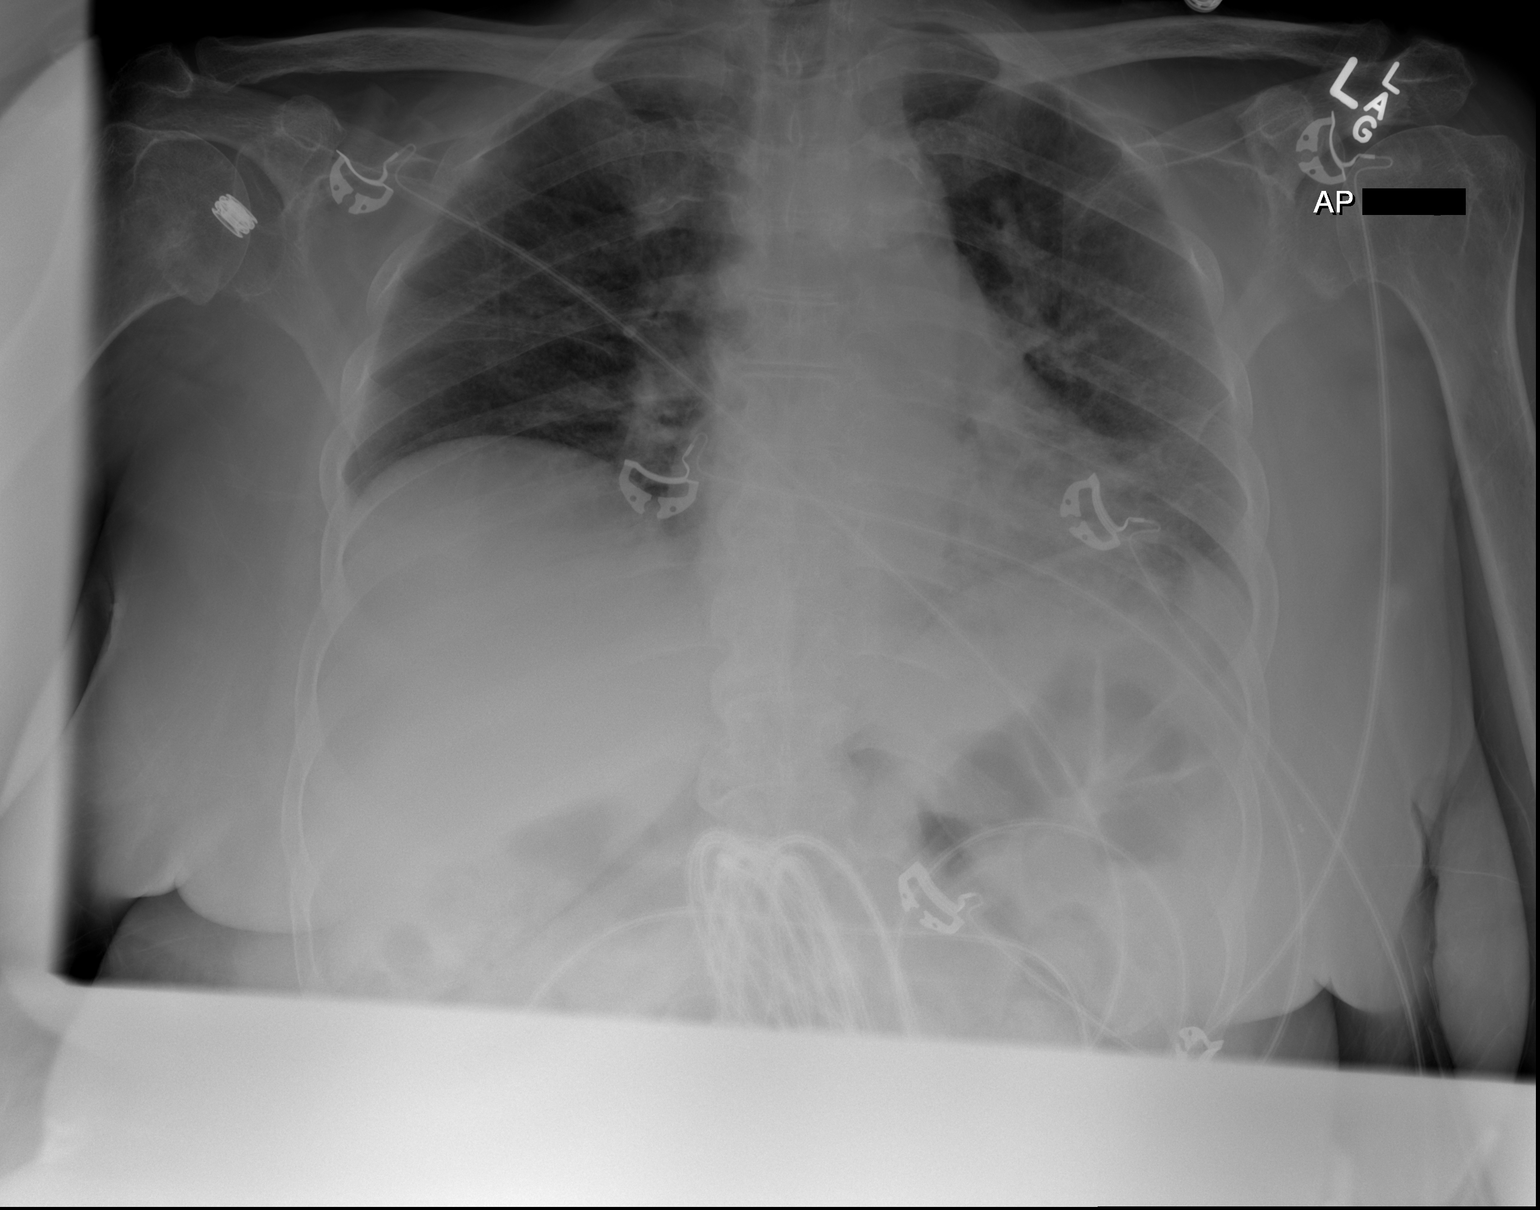

[2 of 2 positions shown; findings below may reference images not displayed]

FINDINGS: Low volume lungs.  Suggestion of retrocardiac opacity in
the frontal projection likely related to hypoaeration given the
lateral view.

Borderline cardiomegaly.  No effusion or pneumothorax.  No acute
osseous findings.
IMPRESSION: Low lung volumes without edema or definite infiltrate.

## 2015-06-09 IMAGING — CR DG PORTABLE PELVIS
1 series · 1 of 1 positions shown · non-contrast
Comparison: Preoperative study 4460 hours the same day.

CLINICAL DATA: 62-year-old female status post right hip surgery.

EXAM:
PORTABLE PELVIS

[AP]
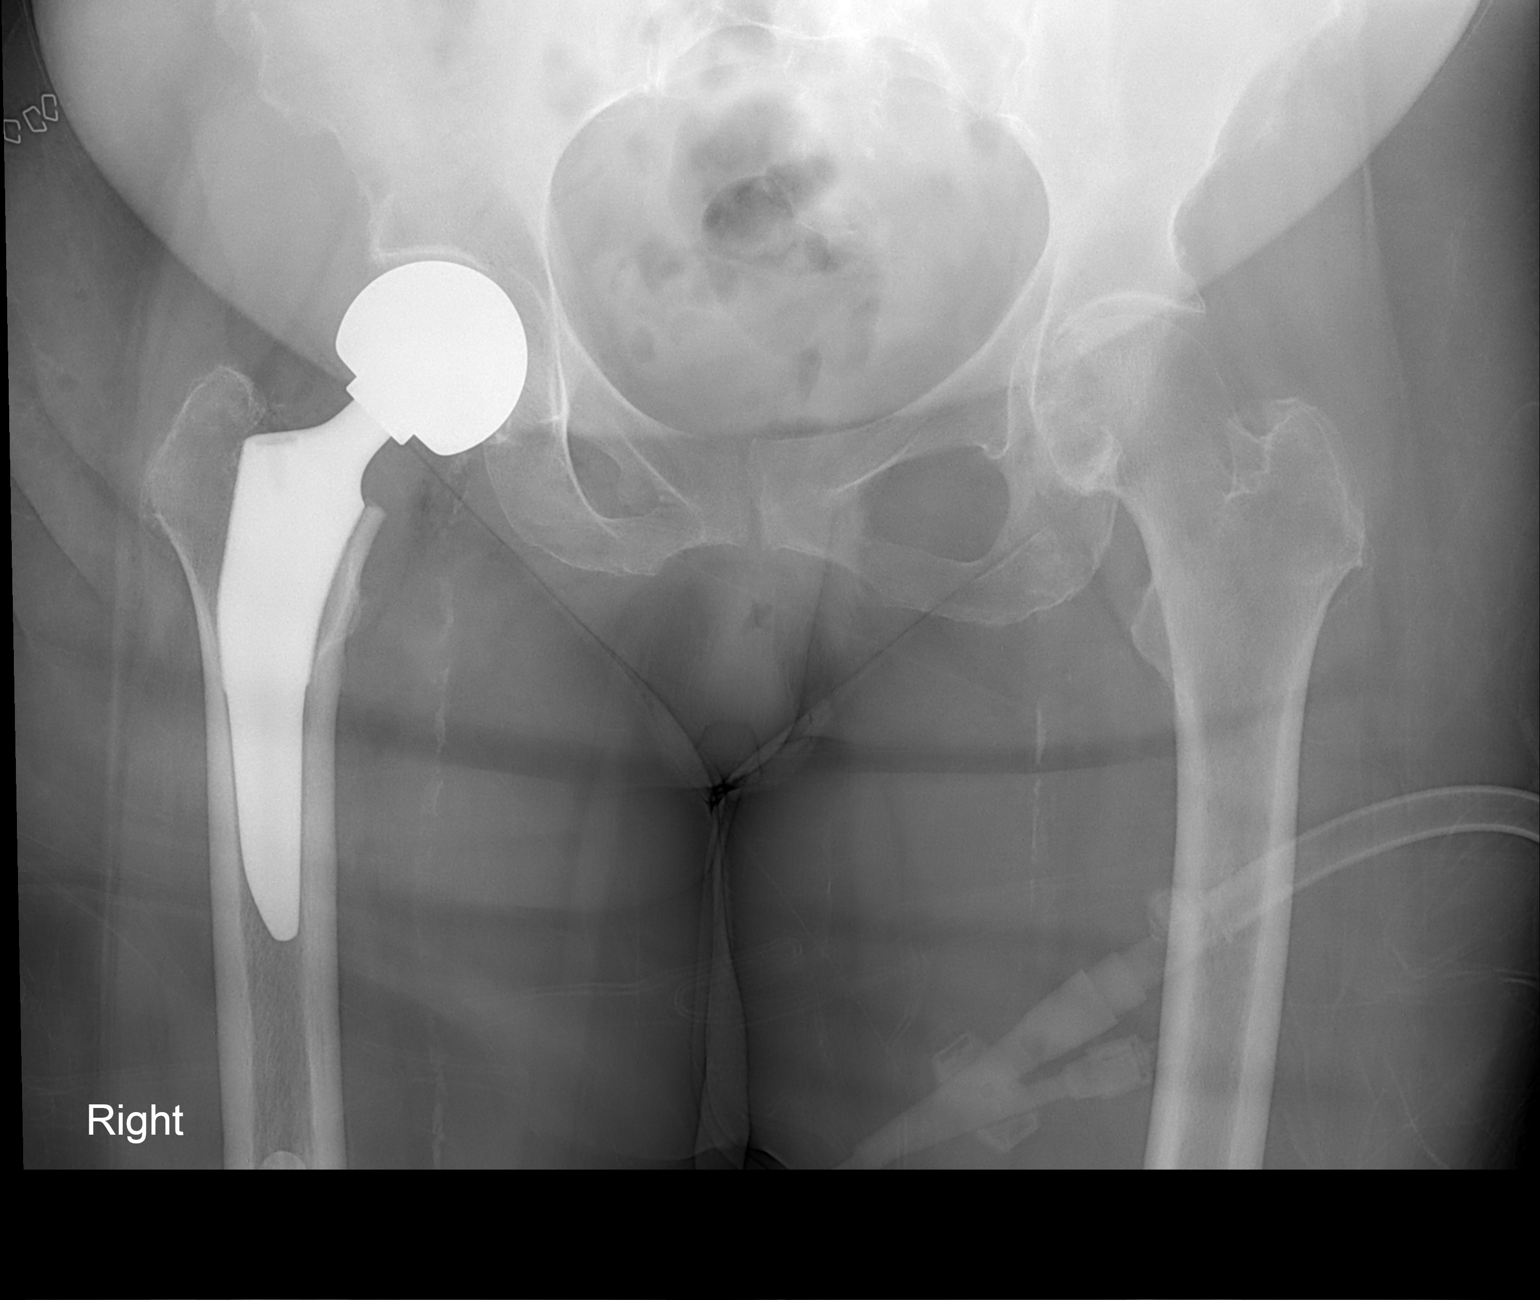

[1 of 1 positions shown; findings below may reference images not displayed]

FINDINGS: Portable AP view of the hips at 1667 hrs. New proximal right femoral
arthroplasty. Acetabular component appears normally aligned with the
pelvis. Postoperative changes to the surrounding soft tissues.
Calcified lower extremity atherosclerosis. Pelvis appears stable and
intact.
IMPRESSION: Right proximal femoral arthroplasty with no adverse features
identified.

## 2015-06-09 IMAGING — CR DG HIP COMPLETE 2+V*R*
3 series · 3 of 3 positions shown · non-contrast
Comparison: None.

CLINICAL DATA: Fall with right hip pain

RIGHT HIP - COMPLETE 2+ VIEW

[x pelvis]
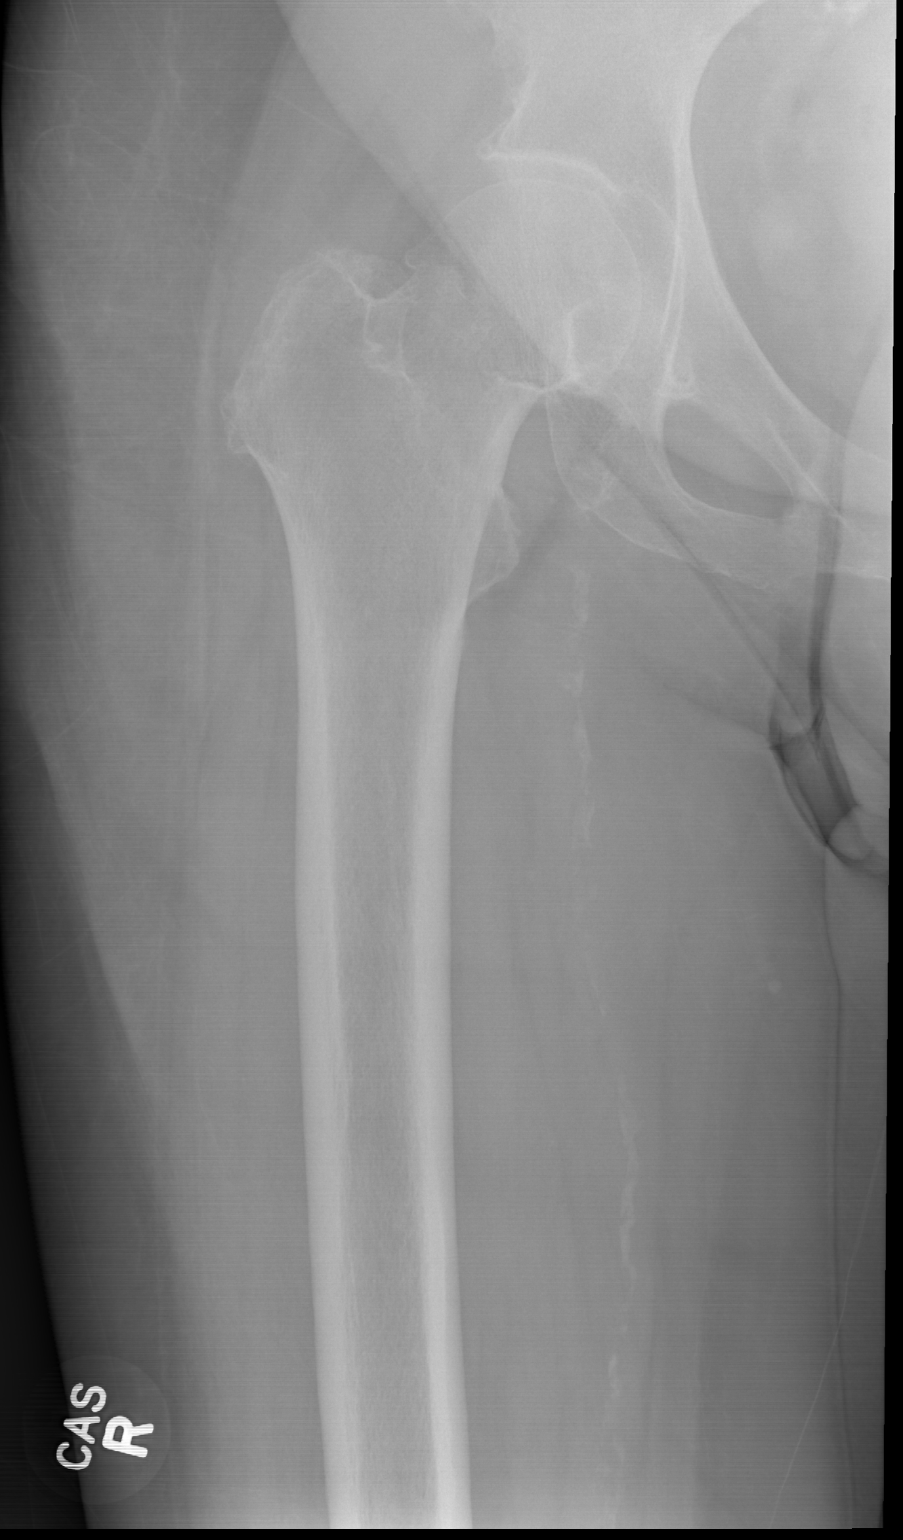

[x hip ap right]
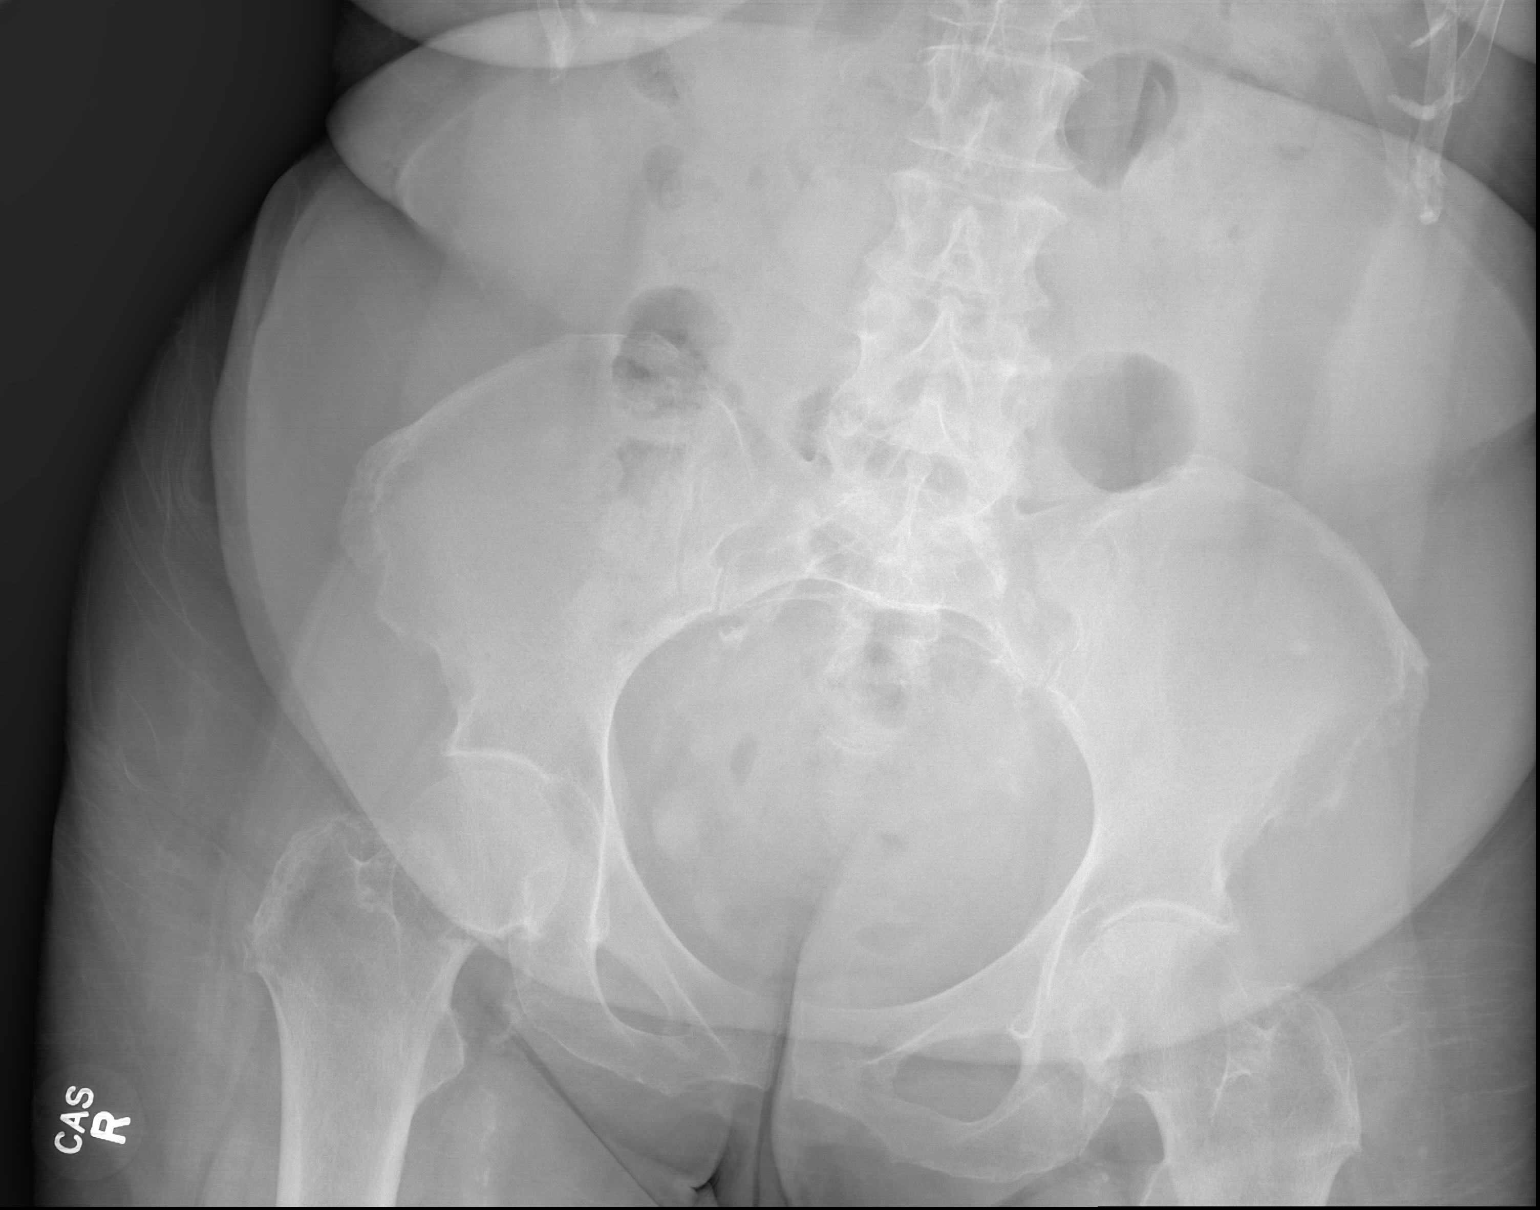

[w hip lat right]
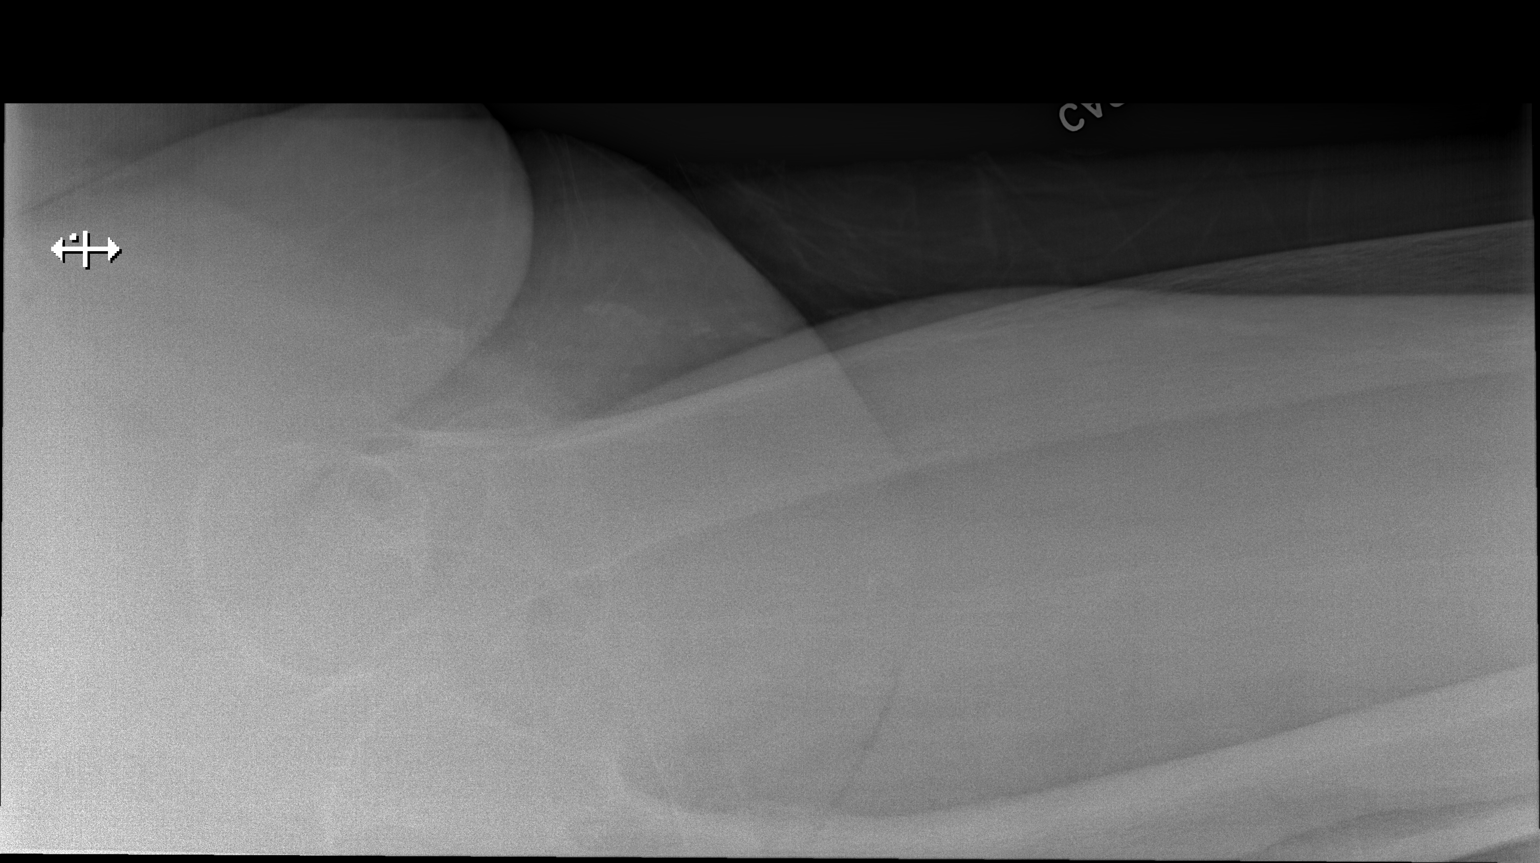

[3 of 3 positions shown; findings below may reference images not displayed]

FINDINGS: The femoral head is located.  The right femoral head is
located.  There are mild degenerative changes of both hips.
Abdominal pannus projects over the proximal right femur.

There is a lucency with an adjacent sclerotic line in the medial
right femoral head, best seen on the AP view of the right hip.
Findings are suspicious for an acute nondisplaced fracture.  Small
osteophyte is seen laterally along the femoral head.

No acute fracture the pelvic ring is identified.

Atherosclerotic calcifications of the right femoral vasculature
noted.
IMPRESSION: Linear lucency with adjacent sclerosis in the medial right femoral
neck is suspicious for a non-displaced acute fracture.

## 2015-06-11 ENCOUNTER — Telehealth: Payer: Self-pay | Admitting: Physical Medicine & Rehabilitation

## 2015-06-11 ENCOUNTER — Encounter: Payer: Self-pay | Admitting: Adult Health

## 2015-06-11 ENCOUNTER — Ambulatory Visit (INDEPENDENT_AMBULATORY_CARE_PROVIDER_SITE_OTHER): Payer: Medicare Other | Admitting: Adult Health

## 2015-06-11 VITALS — BP 120/83 | HR 76 | Ht 67.0 in | Wt 187.5 lb

## 2015-06-11 DIAGNOSIS — Z8673 Personal history of transient ischemic attack (TIA), and cerebral infarction without residual deficits: Secondary | ICD-10-CM

## 2015-06-11 DIAGNOSIS — R269 Unspecified abnormalities of gait and mobility: Secondary | ICD-10-CM

## 2015-06-11 DIAGNOSIS — G8111 Spastic hemiplegia affecting right dominant side: Secondary | ICD-10-CM

## 2015-06-11 NOTE — Progress Notes (Addendum)
PATIENT: Sophia Rodriguez DOB: 1950-06-07  REASON FOR VISIT: follow up-stroke, right spastic hemiparesis HISTORY FROM: patient  HISTORY OF PRESENT ILLNESS: Today 06/11/2015: Sophia Rodriguez is a 65 year old female with a history of multiple strokes. She returns today for follow-up. Since her strokes the patient continues to have difficulty walking. She reports that she drags the right leg. At her last visit with Sophia Rodriguez she was referred to physical therapy. She states that she had to cancel this appointment because her father passed away. She has not had a chance to reschedule this. She reports that she has only had 1 fall. She states this happpened when she was getting her suitcase down- she fell backwards onto blankets. Fortunately she did not suffer any injuries. She continues to take aspirin for stroke prevention. Her blood pressure, cholesterol and diabetes are managed by her primary care provider. She states that her most recent hemoglobin A1c was 6.3%. Her blood pressure is in normal range today. She denies any new strokelike symptoms. She returns today for an evaluation.  HISTORY [facility]): Interval history: She does not feel that she got any better with PT. She is still having difficulty walking. She is dragging the right leg. She has stiffness and cramps in the right leg. No falls recently. She is using her walker all the time. She takes neurontin in the morning and night. Her depression comes and goes but she is feeling better and Sophia Rodriguez increased her celexa. She has a right foot orthosis but doesn't wear it. Will refer to kirsteins and swartz.   MRi brain 12/01/2014: FINDINGS: Diffuse prominence of the CSF containing spaces is compatible with generalized cerebral atrophy. Patchy and confluent T2/FLAIR hyperintensity within the periventricular and deep white matter is most consistent with chronic small vessel ischemic changes. Encephalomalacia within the parasagittal left frontal lobe  most compatible with remote left ACA territory infarct. Infarct involves the body of the corpus callosum and cingulate. Additional scattered remote lacunar infarcts within the bilateral basal ganglia, pons, and right cerebellar hemisphere. Remote infarct within the right thalamus as well. Infarcts within the right thalamus and right cerebellum appear new from previous.  No abnormal foci of restricted diffusion to suggest acute intracranial infarct. Gray-white matter differentiation maintained. Normal intravascular flow voids are preserved. No acute intracranial hemorrhage.  No mass lesion, midline shift, or mass effect. Mild ex vacuo dilatation of the left lateral ventricle related to the left frontal lobe infarct. No hydrocephalus. No extra-axial fluid collection.  Craniocervical junction within normal limits. Scattered multilevel degenerative changes present within the visualized upper cervical spine without significant stenosis. Pituitary gland normal.  No acute abnormality about the orbits.  Retention cyst noted within the right maxillary sinus. Scattered mucosal thickening within the ethmoidal air cells. Paranasal sinuses are otherwise clear. No mastoid effusion. Inner ear structures grossly normal.  Bone marrow signal intensity within normal limits. Scalp soft tissues unremarkable.  IMPRESSION: 1. No acute intracranial infarct or other process identified. 2. Generalized cerebral atrophy with chronic microvascular ischemic disease and multiple remote infarcts as above.  Update 11/08/2014 Sophia Rodriguez : She returns for follow-up after last visit 6 months ago. She continues to have difficulty walking with stiffness and cramps and pain in the right leg with dragging. She's had balance issues with a few falls recently but fortunately has not hurt herself significantly. She said multiple strokes in the past and has had trouble walking and balance issues following each stroke. She was seen  last by Sophia Rodriguez  who recommended home physical therapy but this has not started yet. Patient states that time she gets disoriented loses track of times up to 30 minutes this may happen daily while she was watching TV or sitting quietly. She admits to feeling depressed and has been on Celexa 10 mg daily for last few years and has never tried a higher dose. She also takes gabapentin but only 300 mg once a day. 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 She does have history of diabetes in the past but she has been off medications for a year and a half.   HPI: Sophia Rodriguez is a 65 y.o. female here as a referral from Sophia Rodriguez for stroke. She has a PMHx of HTN, DM. 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 First stroke 5 years ago. Had 2 a few years ago. She is compliant with medications. Her gait has worsened. She used to use a cane and now she is using a walker. She can't get around like she used to. She feels weak. She is having word-finding difficulty. Sometimes when she is laying in bed it hurts in her rightear, no dizziness or room spinning. It just hurts. She can't sleep with her right arm extended because the arm goes numb. (Very poor historian, psychomotor slowing). She can't close her right hand all the way, some numbness in the fingers. She has difficulty using right arm. Sometimes her left hand "goes and goes and goes" and she can't stop it. She has to stop herself by touching it, if she makes a concerted effort she can stop her hand. She has neck pain and when she moves it a certain way she can feel pain but no radicular symptoms. She lives alone in her apartment. She has a cleaning person, daughter helps with medications. She babysits her grandaughter every day, she is 8. No falls. She is on aspirin for stroke prevention, reports  she was not on aspirin during her last strokes. She has a primary care physician that she likes very much and manages her medications, cholesterol, glucose and blood pressure.   Reviewed notes, labs and imaging from outside physicians, which showed:   CT of the head 12/2012: A remote infarct is again noted at the left frontal lobe, with associated encephalomalacia. This is grossly unchanged in appearance. There is mild ex vacuo dilatation of the frontal horn of the left lateral ventricle.  Scattered periventricular white matter change likely reflects small vessel ischemic microangiopathy. The previously noted small infarct at the posterior limb of the right internal capsule is not well characterized.  The posterior fossa, including the cerebellum, brainstem and fourth ventricle, is within normal limits. No mass effect or midline shift is seen.  There is no evidence of fracture; visualized osseous structures are unremarkable in appearance. The orbits are within normal limits. The paranasal sinuses and mastoid air cells are well-aerated. No significant soft tissue abnormalities are seen.  IMPRESSION:  1. No acute intracranial pathology seen to explain the patient's symptoms. 2. Remote infarct again noted at the left frontal lobe, with associated encephalomalacia and mild ex vacuo dilatation of the frontal horn of the left lateral ventricle. 3. Scattered small vessel ischemic microangiopathy.  MRI of the brain 09/2012: IMPRESSION: 1. Acute ischemic infarct involving the posterior limb of the right internal capsule, at or just adjacent to recently  identified acute ischemic infarct seen in this region on MRI from 08/26/2012. 2. Stable appearance of additional remote infarcts as detailed Above.  (Reviewed images and agree with above findings.)  MRA of the head 08/2012: MRA HEAD  Findings: Cavernous segment internal carotid artery narrowing and irregularity more notable on the  left.  Hypoplastic A1 segment right anterior cerebral artery with narrowing and irregularity suggesting superimposed atherosclerotic type changes.  Mild narrowing M1 segment right middle cerebral artery.  Middle cerebral artery branch vessel mild to moderate irregularity narrowing.  Mild irregularity of the A2 segment of the anterior cerebral artery bilaterally.  Ectatic vertebral arteries and basilar artery. The left vertebral artery is dominant. Very mild narrowing of the left vertebral artery after the takeoff of the left PICA. Mild narrowing distal right vertebral artery.  Irregularity and narrowing of the right PICA which is smaller than the left PICA.  Mild irregularity of the basilar artery without high-grade stenosis. Nonvisualization AICA bilaterally.  Moderate narrowing the superior cerebellar artery bilaterally.  Mild narrowing proximal right posterior cerebral artery. P2 segment the posterior cerebral artery mild to moderate irregularity greater on the right. Posterior cerebral artery distal branch vessel narrowing irregularity bilaterally.  No aneurysm or vascular malformation noted.  MRA of the neck: 08/2012:   IMPRESSION: No evidence hemodynamically significant stenosis involving either carotid bifurcation.  Left vertebral artery is dominant. Ectatic proximal left vertebral artery with fold and mild narrowing. Minimal narrowing proximal right vertebral artery.  Cbc 12/2012 with anemia, bmp 12/2012 with gfr 76 (N>90). No recent labs available to review.  2d echo and carotid dopplers 08/2012: Study Conclusions  Left ventricle: The cavity size was normal. Wall thickness was normal. Systolic function was normal. The estimated ejection fraction was in the range of 60% to 65%. Wall motion was normal; there were no regional wall motion abnormalities.   Carotid Summary: - Findings consistent with less than 39 percent stenosis involving the right  internal carotid artery and the left internal carotid artery. - Bilateral vertebral artery flow antegrade.   REVIEW OF SYSTEMS: Out of a complete 14 system review of symptoms, the patient complains only of the following symptoms, and all other reviewed systems are negative.  Memory loss, speech difficulty, agitation, decreased concentration, depression, nervous/anxious  ALLERGIES: No Known Allergies  HOME MEDICATIONS: Outpatient Prescriptions Prior to Visit  Medication Sig Dispense Refill  . aspirin 325 MG tablet Take 1 tablet (325 mg total) by mouth daily.    Marland Kitchen atorvastatin (LIPITOR) 40 MG tablet Take 1 tablet (40 mg total) by mouth daily at 6 PM. 30 tablet 1  . cetirizine (ZYRTEC) 10 MG tablet Take 10 mg by mouth daily.    . citalopram (CELEXA) 10 MG tablet Take 10 mg by mouth daily.    . citalopram (CELEXA) 20 MG tablet Take 1 tablet (20 mg total) by mouth daily. 60 tablet 1  . furosemide (LASIX) 20 MG tablet TK 1 T PO  QD PRF LEG SWELLING  0  . gabapentin (NEURONTIN) 300 MG capsule Take 1 capsule (300 mg total) by mouth 3 (three) times daily. 90 capsule 3  . lisinopril (PRINIVIL,ZESTRIL) 10 MG tablet Take 10 mg by mouth daily.    . Multiple Vitamin (MULTIVITAMIN WITH MINERALS) TABS Take 1 tablet by mouth daily.    Marland Kitchen olopatadine (PATANOL) 0.1 % ophthalmic solution INSTILL 1 GTT IN OU BID  12  . Vitamin D, Ergocalciferol, (DRISDOL) 50000 UNITS CAPS capsule Take 50,000 Units by mouth daily.    Marland Kitchen  VOLTAREN 1 % GEL Apply 1 application topically as needed.  5  . traZODone (DESYREL) 100 MG tablet     . zolpidem (AMBIEN) 10 MG tablet Take 10 mg by mouth daily.  5  . zolpidem (AMBIEN) 5 MG tablet Take 1 tablet (5 mg total) by mouth at bedtime as needed for sleep. 30 tablet 0   No facility-administered medications prior to visit.    PAST MEDICAL HISTORY: Past Medical History  Diagnosis Date  . Stroke (HCC)   . Hypertension   . Diabetes mellitus     PAST SURGICAL HISTORY: Past  Surgical History  Procedure Laterality Date  . Hip arthroplasty Right 11/24/2012    Procedure: RIGHT HIP HEMIARTHROPLASTY;  Surgeon: Verlee RossettiSteven R Norris, MD;  Location: [facility];  Service: Orthopedics;  Laterality: Right;  hemiatroplasty, DePuy Triloc    FAMILY HISTORY: Family History  Problem Relation Age of Onset  . Stroke Brother   . Stroke Father     SOCIAL HISTORY: Social History   Social History  . Marital Status: Single    Spouse Name: N/A  . Number of Children: 2  . Years of Education: Grad schoo   Occupational History  . Not on file.   Social History Main Topics  . Smoking status: Former Smoker    Quit date: 05/28/2007  . Smokeless tobacco: Not on file  . Alcohol Use: No  . Drug Use: No  . Sexual Activity: Not on file   Other Topics Concern  . Not on file   Social History Narrative   Lives at home by herself.    Daughter come to help at home and she has a cleaning lady to help twice a week.    Caffeine: Every morning 1-1.5 cups coffee/day, and 12oz tea/day       PHYSICAL EXAM  Filed Vitals:   06/11/15 0918  BP: 120/83  Pulse: 76  Height: 5\' 7"  (1.702 m)  Weight: 187 lb 8 oz (85.049 kg)   Body mass index is 29.36 kg/(m^2).  Generalized: Well developed, in no acute distress   Neurological examination  Mentation: Alert oriented to time, place, history taking. Follows all commands speech and language fluent Cranial nerve II-XII: Pupils were equal round reactive to light. Extraocular movements were full, visual field were full on confrontational test. Facial sensation and strength were normal. Uvula tongue midline. Head turning and shoulder shrug  were normal and symmetric. Motor: The motor testing reveals 5 over 5 strength of all 4 extremities. Increased tone in the right leg. Sensory: Sensory testing is intact to soft touch on all 4 extremities. No evidence of extinction is noted.  Coordination: Cerebellar testing reveals good finger-nose-finger and  heel-to-shin bilaterally.  Gait and station: Patient has a spastic gait. Slightly wide-based and dragging the right leg. She uses a walker when ambulate. She requires a wheelchair for longer distances. Reflexes: Deep tendon reflexes are symmetric and normal bilaterally.   DIAGNOSTIC DATA (LABS, IMAGING, TESTING) - I reviewed patient records, labs, notes, testing and imaging myself where available.  Lab Results  Component Value Date   WBC 7.1 12/01/2014   HGB 17.7* 12/01/2014   HCT 52.0* 12/01/2014   MCV 81.1 12/01/2014   PLT 250 12/01/2014      Component Value Date/Time   NA 141 12/01/2014 1811   K 3.9 12/01/2014 1811   CL 105 12/01/2014 1811   CO2 22 12/01/2014 1802   GLUCOSE 130* 12/01/2014 1811   BUN 15 12/01/2014 1811  CREATININE 1.10* 12/01/2014 1811   CALCIUM 9.9 12/01/2014 1802   PROT 8.1 12/01/2014 1802   PROT 7.4 11/22/2014 0932   ALBUMIN 3.9 12/01/2014 1802   AST 30 12/01/2014 1802   ALT 21 12/01/2014 1802   ALKPHOS 100 12/01/2014 1802   BILITOT 0.5 12/01/2014 1802   GFRNONAA 48* 12/01/2014 1802   GFRAA 55* 12/01/2014 1802        ASSESSMENT AND PLAN 65 y.o. year old female  has a past medical history of Stroke (HCC); Hypertension; and Diabetes mellitus. here with:  1. History of multiple strokes 2. Abnormality of gait 3. Right spastic hemiparesis  The patient continues to have difficulty with ambulation. I have encouraged the patient to call and reschedule her physical therapy appointment. She is encouraged to use her walker at all times. She should remain on aspirin for stroke prevention. She should keep her blood pressure less than 130/90, cholesterol LDL less than 70 and hemoglobin A1c less than 6.5%. Patient advised that if she has any strokelike symptoms she should call 911 immediately. She will follow up with our office in 6 months or sooner if needed.   Butch Penny, MSN, NP-C 06/11/2015, 9:22 AM [facility] [address],  [address] [place], [place] [address] [phone]   Personally participated in and made any corrections needed to history, physical, neuro exam,assessment and plan as stated above.  I have personally evaluated lab data, reviewed imaging studies and agree with radiology interpretations.    Naomie Dean, MD Stroke Neurology [phone] [facility]

## 2015-06-11 NOTE — Telephone Encounter (Signed)
Spoke with patient about her no show fee, and told her we would waive this fee this time, but if she no showed again we would have to charge her.

## 2015-06-11 NOTE — Patient Instructions (Signed)
Reschedule appointment for PT Continue Aspirin Monitor Blood Pressure <130/90 Cholesterol LDL <70 HgbA1c < 6.5 % If you develop any strokelike symptoms please call 911  If your symptoms worsen or you develop new symptoms please let us know.

## 2015-06-15 ENCOUNTER — Encounter: Payer: Medicare Other | Attending: Physical Medicine & Rehabilitation | Admitting: Physical Medicine & Rehabilitation

## 2015-06-15 ENCOUNTER — Encounter: Payer: Self-pay | Admitting: Physical Medicine & Rehabilitation

## 2015-06-15 VITALS — BP 119/78 | HR 82 | Resp 14

## 2015-06-15 DIAGNOSIS — M6249 Contracture of muscle, multiple sites: Secondary | ICD-10-CM

## 2015-06-15 DIAGNOSIS — R269 Unspecified abnormalities of gait and mobility: Secondary | ICD-10-CM | POA: Diagnosis not present

## 2015-06-15 DIAGNOSIS — Z8673 Personal history of transient ischemic attack (TIA), and cerebral infarction without residual deficits: Secondary | ICD-10-CM | POA: Insufficient documentation

## 2015-06-15 DIAGNOSIS — M62838 Other muscle spasm: Secondary | ICD-10-CM

## 2015-06-15 DIAGNOSIS — I69898 Other sequelae of other cerebrovascular disease: Secondary | ICD-10-CM | POA: Diagnosis not present

## 2015-06-15 DIAGNOSIS — I10 Essential (primary) hypertension: Secondary | ICD-10-CM | POA: Diagnosis not present

## 2015-06-15 DIAGNOSIS — I6930 Unspecified sequelae of cerebral infarction: Secondary | ICD-10-CM

## 2015-06-15 DIAGNOSIS — E119 Type 2 diabetes mellitus without complications: Secondary | ICD-10-CM | POA: Diagnosis not present

## 2015-06-15 DIAGNOSIS — I69359 Hemiplegia and hemiparesis following cerebral infarction affecting unspecified side: Secondary | ICD-10-CM

## 2015-06-15 MED ORDER — BACLOFEN 10 MG PO TABS: 10.0000 mg | ORAL_TABLET | Freq: Three times a day (TID) | ORAL | Status: DC

## 2015-06-15 NOTE — Progress Notes (Signed)
Subjective:    Patient ID: Sophia Rodriguez, female    DOB: 25-Apr-1950, 65 y.o.   MRN: [id number]  HPI  65 y/o female with pmh of strokes x3 (most recently ~3 years ago), DM, HTN presents for evaluation of spasticity.  The problem is mainly in her right leg.  It causes her difficulty with ambulation.  She uses a rolling walker currently and has not had any falls.  The problems started ~ 5 years ago getting worse over times. The biggest problems seems to be dragging of her foot.  She has tried an AFO, but she states it was too cumbersome and she did not notice a difference.  It gets worse at the end of the day.  She sleeps okay.  She denies pain in that foot. She has not tried any medications.  It is constant.  She has some associated weakness, but denies numbness/tingling.    Pain Inventory Average Pain NA Pain Right Now NA My pain is NA  In the last 24 hours, has pain interfered with the following? General activity NA Relation with others NA Enjoyment of life NA What TIME of day is your pain at its worst? evening Sleep (in general) Fair  Pain is worse with: walking, bending and standing Pain improves with: NA Relief from Meds: NA  Mobility use a walker ability to climb steps?  no do you drive?  no  Function retired I need assistance with the following:  bathing, household duties and shopping  Neuro/Psych weakness trouble walking confusion depression  Prior Studies Any changes since last visit?  no  Physicians involved in your care Primary care Dr. Kem Parkinson Neurologist [facility]   Family History  Problem Relation Age of Onset  . Stroke Brother   . Stroke Father    Social History   Social History  . Marital Status: Single    Spouse Name: N/A  . Number of Children: 2  . Years of Education: Grad schoo   Social History Main Topics  . Smoking status: Former Smoker    Quit date: 05/28/2007  . Smokeless tobacco: None  . Alcohol Use: No  . Drug Use: No  . Sexual  Activity: Not Asked   Other Topics Concern  . None   Social History Narrative   Lives at home by herself.    Daughter come to help at home and she has a cleaning lady to help twice a week.    Caffeine: Every morning 1-1.5 cups coffee/day, and 12oz tea/day    Past Surgical History  Procedure Laterality Date  . Hip arthroplasty Right 11/24/2012    Procedure: RIGHT HIP HEMIARTHROPLASTY;  Surgeon: Verlee Rossetti, MD;  Location: [facility];  Service: Orthopedics;  Laterality: Right;  hemiatroplasty, DePuy Triloc   Past Medical History  Diagnosis Date  . Stroke (HCC)   . Hypertension   . Diabetes mellitus   . Arthritis    BP 119/78 mmHg  Pulse 82  Resp 14  SpO2 99%  Opioid Risk Score:   Fall Risk Score:  `1  Depression screen PHQ 2/9  Depression screen PHQ 2/9 06/15/2015  Decreased Interest 1  Down, Depressed, Hopeless 1  PHQ - 2 Score 2  Altered sleeping 3  Tired, decreased energy 2  Change in appetite 0  Feeling bad or failure about yourself  1  Trouble concentrating 3  Moving slowly or fidgety/restless 2  Suicidal thoughts 0  PHQ-9 Score 13  Difficult doing work/chores Somewhat difficult   Review  of Systems  Musculoskeletal: Positive for gait problem.  Neurological: Positive for weakness.  Psychiatric/Behavioral: Positive for confusion and dysphoric mood.  All other systems reviewed and are negative.     Objective:   Physical Exam HENT: Normocephalic, Atraumatic Eyes: EOMI, Conj WNL Cardio: S1, S2 normal, RRR Pulm: B/l clear to auscultation.  Effort normal Abd: Soft, non-distended, non-tender, BS+ MSK:  Gait right toe walking on right.   No TTP.    No edema. Neuro: CN II-XII grossly intact, with mild right facial weakness    Sensation intact to light touch in all LE dermatomes  Reflexes 3+ RLE  Strength  4+/5 in RLE myotomes    5/5 in LLE myotomes MAS: Pt with difficulty relaxing and not resisting, however, tone does appear to be increased in RLE at 1+/4  distal >proximal Skin: Warm and Dry    Assessment & Plan:  65 y/o female with pmh of strokes x3 (most recently ~3 years ago), DM, HTN presents for evaluation of spasticity.    1. RLE spasticity s/p stroke  Completed therapy without benefit  Pt states AFO ineffective   Encouraged muscle stretches  Baclofen 5mg  TID for 1 week, if no side effects, can increase to 10mg  TID  Will consider Botox injection on next visit  2. Abnormality of gait  Toe walking with RLE  Cont walker for safety  RTC 1 month

## 2015-07-13 ENCOUNTER — Encounter: Payer: Medicare Other | Attending: Physical Medicine & Rehabilitation | Admitting: Physical Medicine & Rehabilitation

## 2015-07-13 ENCOUNTER — Encounter: Payer: Self-pay | Admitting: Physical Medicine & Rehabilitation

## 2015-07-13 VITALS — BP 109/71 | HR 94

## 2015-07-13 DIAGNOSIS — R269 Unspecified abnormalities of gait and mobility: Secondary | ICD-10-CM | POA: Diagnosis not present

## 2015-07-13 DIAGNOSIS — G8111 Spastic hemiplegia affecting right dominant side: Secondary | ICD-10-CM | POA: Diagnosis not present

## 2015-07-13 DIAGNOSIS — Z8673 Personal history of transient ischemic attack (TIA), and cerebral infarction without residual deficits: Secondary | ICD-10-CM | POA: Diagnosis not present

## 2015-07-13 DIAGNOSIS — I10 Essential (primary) hypertension: Secondary | ICD-10-CM | POA: Diagnosis not present

## 2015-07-13 DIAGNOSIS — G8191 Hemiplegia, unspecified affecting right dominant side: Secondary | ICD-10-CM

## 2015-07-13 DIAGNOSIS — E119 Type 2 diabetes mellitus without complications: Secondary | ICD-10-CM | POA: Diagnosis not present

## 2015-07-13 DIAGNOSIS — I69898 Other sequelae of other cerebrovascular disease: Secondary | ICD-10-CM | POA: Diagnosis not present

## 2015-07-13 NOTE — Progress Notes (Addendum)
Botox: Procedure Note Patient Name: Sophia Rodriguez DOB: 08/25/1950 MRN: [id number]   Procedure: Botulinum toxin administration Guidance: EMG Diagnosis: Spastic hemiparesis Date: 07/13/15 Attending: Maryla MorrowAnkit Patel, MD   Informed consent: Risks, benefits & options of the procedure are explained to the patient (and/or family). The patient elects to proceed with procedure. Risks include but are not limited to weakness, respiratory distress, dry mouth, ptosis, antibody formation, worsening of some areas of function. Benefits include decreased abnormal muscle tone, improved hygiene and positioning, decreased skin breakdown and, in some cases, decreased pain. Options include conservative management with oral antispasticity agents, phenol chemodenervation of nerve or at motor nerve branches. More invasive options include intrathecal balcofen adminstration for appropriate candidates. Surgical options may include tendon lengthening or transposition or, rarely, dorsal rhizotomy.   History/Physical Examination: 65 y.o. with pmh of strokes x3 (most recently ~3 years ago), DM, HTN presents for evaluation of spasticity. The problem is mainly in her right leg. It causes her difficulty with ambulation. She uses a rolling walker currently and has not had any falls. The problems started ~ 5 years ago getting worse over times. The biggest problems seems to be dragging of her foot. She has tried an AFO, but she states it was too cumbersome and she did not notice a difference. It gets worse at the end of the day. She sleeps okay. She denies pain in that foot. She has not tried any medications. It is constant. She has some associated weakness, but denies numbness/tingling. She was last seen in clinic on 06/15/15.  At that time she was tried on baclofen 5 and increased to 10 if tolerated.  Pt states that she was not able to tolerate 10mg  and that 5mg  was helpful, but not effective enough.  She would like to proceed with  botox. Focal spasticity noted, more functional.    mAS: Pt with difficulty relaxing and not resisting, however, tone does appear to be increased in RLE at 1+/4 distal >proximal, more functional spasticity noted with gait.  Previous Treatments: Therapy/Range of motion Indication for guidance: Target active muscules  Procedure: Botulinum toxin was mixed with preservative free saline with a dilution of 1cc to 100 units. Targeted limb and muscles were identified. The skin was prepped with alcohol swabs and placement of needle tip in targeted muscle was confirmed using appropriate guidance. Prior to injection, positioning of needle tip outside of blood vessel was determined by pulling back on syringe plunger.  MUSCLE UNITS Right medial gastroc: 20U Right lateral gastroc: 20U  Total units used: 60U Complications: None  Plan: Relatively small amount of botox injected due to lack of tolerace with Baclofen.  Informed pt to d/c baclofen.  Will follow up in 6 weeks to evaluate for efficacy/side effects.   Ankit Karis Jubanil Patel 07/13/15  10:42 AM

## 2015-08-24 ENCOUNTER — Encounter: Payer: Self-pay | Admitting: Physical Medicine & Rehabilitation

## 2015-08-24 ENCOUNTER — Encounter: Payer: Medicare Other | Attending: Physical Medicine & Rehabilitation | Admitting: Physical Medicine & Rehabilitation

## 2015-08-24 VITALS — BP 127/83 | HR 71 | Resp 14

## 2015-08-24 DIAGNOSIS — G8310 Monoplegia of lower limb affecting unspecified side: Secondary | ICD-10-CM | POA: Diagnosis not present

## 2015-08-24 DIAGNOSIS — E119 Type 2 diabetes mellitus without complications: Secondary | ICD-10-CM | POA: Insufficient documentation

## 2015-08-24 DIAGNOSIS — Z8673 Personal history of transient ischemic attack (TIA), and cerebral infarction without residual deficits: Secondary | ICD-10-CM | POA: Insufficient documentation

## 2015-08-24 DIAGNOSIS — R269 Unspecified abnormalities of gait and mobility: Secondary | ICD-10-CM | POA: Diagnosis not present

## 2015-08-24 DIAGNOSIS — I10 Essential (primary) hypertension: Secondary | ICD-10-CM | POA: Insufficient documentation

## 2015-08-24 DIAGNOSIS — I69898 Other sequelae of other cerebrovascular disease: Secondary | ICD-10-CM | POA: Insufficient documentation

## 2015-08-24 MED ORDER — BACLOFEN 10 MG PO TABS: 5.0000 mg | ORAL_TABLET | Freq: Three times a day (TID) | ORAL | Status: DC

## 2015-08-24 NOTE — Progress Notes (Signed)
Subjective:    Patient ID: Sophia Rodriguez, female    DOB: 09-May-1950, 65 y.o.   MRN: [id number]  HPI  65 y/o female with pmh of strokes x3 (most recently ~3 years ago), DM, HTN presents for follow up of spasticity.  The problem is mainly in her right leg.  It causes her difficulty with ambulation.  She uses a rolling walker currently and has not had any falls.  The problems started ~ 5 years ago getting worse over times. The biggest problems seems to be dragging of her foot.  She has tried an AFO, but she states it was too cumbersome and she did not notice a difference.  It gets worse at the end of the day.  She has some associated weakness, but denies numbness/tingling.  She was last seen in clinic on 06/15/15. At that time she was tried on baclofen 5 and increased to 10 if tolerated. Pt states that she was not able to tolerate 10mg  and that 5mg  was helpful, but not effective enough. Focal spasticity noted, more functional. She elected for local treatment with Botox.  Last clinic visit 07/13/15 at which time she was injected 20U to medial and 20U lateral gastroc.  She states she did not feel a difference, nor did she have side effects.   Pain Inventory Average Pain 0 Pain Right Now 0 My pain is no pain  In the last 24 hours, has pain interfered with the following? General activity 4 Relation with others 3 Enjoyment of life 4 What TIME of day is your pain at its worst? evening Sleep (in general) Fair  Pain is worse with: walking, bending and standing Pain improves with: rest Relief from Meds: 5  Mobility walk with assistance use a walker ability to climb steps?  no do you drive?  no  Function disabled: date disabled . I need assistance with the following:  meal prep, household duties and shopping  Neuro/Psych weakness trouble walking confusion depression  Prior Studies Any changes since last visit?  no  Physicians involved in your care Any changes since last visit?   no   Family History  Problem Relation Age of Onset  . Stroke Brother   . Stroke Father    Social History   Social History  . Marital Status: Single    Spouse Name: N/A  . Number of Children: 2  . Years of Education: Grad schoo   Social History Main Topics  . Smoking status: Former Smoker    Quit date: 05/28/2007  . Smokeless tobacco: None  . Alcohol Use: No  . Drug Use: No  . Sexual Activity: Not Asked   Other Topics Concern  . None   Social History Narrative   Lives at home by herself.    Daughter come to help at home and she has a cleaning lady to help twice a week.    Caffeine: Every morning 1-1.5 cups coffee/day, and 12oz tea/day    Past Surgical History  Procedure Laterality Date  . Hip arthroplasty Right 11/24/2012    Procedure: RIGHT HIP HEMIARTHROPLASTY;  Surgeon: Verlee RossettiSteven R Norris, MD;  Location: [facility];  Service: Orthopedics;  Laterality: Right;  hemiatroplasty, DePuy Triloc   Past Medical History  Diagnosis Date  . Stroke (HCC)   . Hypertension   . Diabetes mellitus   . Arthritis    BP 127/83 mmHg  Pulse 71  Resp 14  SpO2 99%  Opioid Risk Score:   Fall Risk Score:  `1  Depression screen PHQ 2/9  Depression screen [facility] 2/9 07/13/2015 06/15/2015  Decreased Interest 1 1  Down, Depressed, Hopeless 1 1  PHQ - 2 Score 2 2  Altered sleeping - 3  Tired, decreased energy - 2  Change in appetite - 0  Feeling bad or failure about yourself  - 1  Trouble concentrating - 3  Moving slowly or fidgety/restless - 2  Suicidal thoughts - 0  PHQ-9 Score - 13  Difficult doing work/chores - Somewhat difficult   Review of Systems  Musculoskeletal: Positive for gait problem.  Neurological: Positive for weakness.  Psychiatric/Behavioral: Positive for confusion and dysphoric mood.  All other systems reviewed and are negative.     Objective:   Physical Exam HENT: Normocephalic, Atraumatic Eyes: EOMI, Conj WNL Cardio: S1, S2 normal, RRR Pulm: B/l clear to  auscultation.  Effort normal Abd: Soft, non-distended, non-tender, BS+ MSK:  Gait right toe walking on right.   No TTP.    No edema. Neuro: CN II-XII grossly intact, with mild right facial weakness    Sensation intact to light touch in all LE dermatomes  Reflexes 3+ RLE  Strength  4+/5 in RLE myotomes    5/5 in LLE myotomes MAS: Pt with difficulty relaxing and not resisting, however, ?increased tone RLE  Skin: Warm and Dry    Assessment & Plan:  65 y/o female with pmh of strokes x3 (most recently ~3 years ago), DM, HTN presents for evaluation of spasticity.    1. RLE spasticity s/p stroke  Completed therapy without benefit  Pt states AFO ineffective   Encouraged muscle stretches  Will order Baclofen  for 6 weeks, effective in the past with improvement, but not relief  Will repeat Botox injection in 6 weeks, increasing the dose to 45U to medial and lateral gastroc  2. Abnormality of gait  Toe walking with RLE  Cont walker for safety  RTC 1 month

## 2015-10-04 ENCOUNTER — Encounter: Payer: Medicare Other | Attending: Physical Medicine & Rehabilitation | Admitting: Physical Medicine & Rehabilitation

## 2015-10-04 ENCOUNTER — Encounter: Payer: Self-pay | Admitting: Physical Medicine & Rehabilitation

## 2015-10-04 ENCOUNTER — Other Ambulatory Visit: Payer: Self-pay

## 2015-10-04 VITALS — BP 111/77 | HR 70 | Resp 17

## 2015-10-04 DIAGNOSIS — I69898 Other sequelae of other cerebrovascular disease: Secondary | ICD-10-CM | POA: Diagnosis present

## 2015-10-04 DIAGNOSIS — Z8673 Personal history of transient ischemic attack (TIA), and cerebral infarction without residual deficits: Secondary | ICD-10-CM | POA: Diagnosis not present

## 2015-10-04 DIAGNOSIS — I10 Essential (primary) hypertension: Secondary | ICD-10-CM | POA: Insufficient documentation

## 2015-10-04 DIAGNOSIS — M6249 Contracture of muscle, multiple sites: Secondary | ICD-10-CM

## 2015-10-04 DIAGNOSIS — M62838 Other muscle spasm: Secondary | ICD-10-CM

## 2015-10-04 DIAGNOSIS — R269 Unspecified abnormalities of gait and mobility: Secondary | ICD-10-CM | POA: Diagnosis not present

## 2015-10-04 DIAGNOSIS — E119 Type 2 diabetes mellitus without complications: Secondary | ICD-10-CM | POA: Diagnosis not present

## 2015-10-04 MED ORDER — TIZANIDINE HCL 2 MG PO CAPS: 2.0000 mg | ORAL_CAPSULE | Freq: Three times a day (TID) | ORAL | Status: DC | PRN

## 2015-10-04 MED ORDER — TIZANIDINE HCL 2 MG PO CAPS
2.0000 mg | ORAL_CAPSULE | Freq: Three times a day (TID) | ORAL | Status: DC | PRN
Start: 2015-10-04 — End: 2015-10-04

## 2015-10-04 NOTE — Telephone Encounter (Signed)
Pt is requesting a 90 day supply.

## 2015-10-04 NOTE — Progress Notes (Signed)
Subjective:    Patient ID: Sophia Rodriguez, female    DOB: 1950/05/23, 65 y.o.   MRN: [id number]  HPI  65 y/o female with pmh of strokes x3 (most recently ~2014), DM, HTN presents for follow up of spasticity.  The problem is mainly in her right leg.  It causes her difficulty with ambulation.  She uses a rolling walker currently and has not had any falls.  The problems started ~ 5 years ago getting worse over times. The biggest problems seems to be dragging of her foot.  She has tried an AFO, but she states it was too cumbersome and she did not notice a difference.  It gets worse at the end of the day.  She has some associated weakness, but denies numbness/tingling.  She was last seen in clinic on 08/24/15. Previously she was prescribed Baclofen 5, which did not work and 10, which caused her too much drowsiness.  She had Botox, but this was not effective.   Pain Inventory Average Pain 0 Pain Right Now 0 My pain is no pain  In the last 24 hours, has pain interfered with the following? General activity 4 Relation with others 3 Enjoyment of life 4 What TIME of day is your pain at its worst? evening Sleep (in general) Fair  Pain is worse with: walking, bending and standing Pain improves with: rest Relief from Meds: 5  Mobility walk with assistance use a walker ability to climb steps?  no do you drive?  no  Function disabled: date disabled . I need assistance with the following:  meal prep, household duties and shopping  Neuro/Psych weakness trouble walking confusion depression  Prior Studies Any changes since last visit?  no  Physicians involved in your care Any changes since last visit?  no   Family History  Problem Relation Age of Onset  . Stroke Brother   . Stroke Father    Social History   Social History  . Marital Status: Single    Spouse Name: N/A  . Number of Children: 2  . Years of Education: Grad schoo   Social History Main Topics  . Smoking status:  Former Smoker    Quit date: 05/28/2007  . Smokeless tobacco: None  . Alcohol Use: No  . Drug Use: No  . Sexual Activity: Not Asked   Other Topics Concern  . None   Social History Narrative   Lives at home by herself.    Daughter come to help at home and she has a cleaning lady to help twice a week.    Caffeine: Every morning 1-1.5 cups coffee/day, and 12oz tea/day    Past Surgical History  Procedure Laterality Date  . Hip arthroplasty Right 11/24/2012    Procedure: RIGHT HIP HEMIARTHROPLASTY;  Surgeon: Verlee RossettiSteven R Norris, MD;  Location: [facility];  Service: Orthopedics;  Laterality: Right;  hemiatroplasty, DePuy Triloc   Past Medical History  Diagnosis Date  . Stroke (HCC)   . Hypertension   . Diabetes mellitus   . Arthritis    BP 111/77 mmHg  Pulse 70  Resp 17  SpO2 98%  Opioid Risk Score:   Fall Risk Score:  `1  Depression screen PHQ 2/9  [facility] 07/13/2015 06/15/2015  Decreased Interest 1 1  Down, Depressed, Hopeless 1 1  PHQ - 2 Score 2 2  Altered sleeping - 3  Tired, decreased energy - 2  Change in appetite - 0  Feeling bad or failure about yourself  -  1  Trouble concentrating - 3  Moving slowly or fidgety/restless - 2  Suicidal thoughts - 0  PHQ-9 Score - 13  Difficult doing work/chores - Somewhat difficult   Review of Systems  Musculoskeletal: Positive for gait problem.  Neurological: Positive for weakness.  Psychiatric/Behavioral: Positive for confusion and dysphoric mood. Memory loss. All other systems reviewed and are negative.     Objective:   Physical Exam HENT: Normocephalic, Atraumatic Eyes: EOMI, Conj WNL Cardio: S1, S2 normal, RRR Pulm: B/l clear to auscultation.  Effort normal Abd: Soft, non-distended, non-tender, BS+ MSK:  Gait right toe walking on right.   No TTP.    No edema. Neuro: CN II-XII grossly intact, with mild right facial weakness    Sensation intact to light touch in all LE dermatomes  Reflexes 3+  RLE  Strength  4+/5 in RLE myotomes    5/5 in LLE myotomes MAS: Pt with difficulty relaxing and not resisting, however, ?increased tone RLE, more pronounced with ambulation. Skin: Warm and Dry    Assessment & Plan:  65 y/o female with pmh of strokes x3 (most recently ~3 years ago), DM, HTN presents for evaluation of spasticity.    1. RLE spasticity s/p stroke  Completed therapy without benefit  Pt states AFO makes her wear shoes all the time, but she is willing to try it.    Encouraged muscle stretches  Will order Tizanidine  Could not perform Botox due to timing  2. Abnormality of gait  Toe walking with RLE  Cont walker for safety  RTC 1 month

## 2015-10-25 ENCOUNTER — Ambulatory Visit: Payer: Medicare Other | Admitting: Physical Medicine & Rehabilitation

## 2015-11-01 ENCOUNTER — Encounter: Payer: Medicare Other | Attending: Physical Medicine & Rehabilitation | Admitting: Physical Medicine & Rehabilitation

## 2015-11-01 ENCOUNTER — Encounter: Payer: Self-pay | Admitting: Physical Medicine & Rehabilitation

## 2015-11-01 VITALS — BP 127/83 | HR 69 | Resp 17

## 2015-11-01 DIAGNOSIS — I69898 Other sequelae of other cerebrovascular disease: Secondary | ICD-10-CM | POA: Diagnosis not present

## 2015-11-01 DIAGNOSIS — Z8673 Personal history of transient ischemic attack (TIA), and cerebral infarction without residual deficits: Secondary | ICD-10-CM | POA: Insufficient documentation

## 2015-11-01 DIAGNOSIS — I69359 Hemiplegia and hemiparesis following cerebral infarction affecting unspecified side: Secondary | ICD-10-CM

## 2015-11-01 DIAGNOSIS — R269 Unspecified abnormalities of gait and mobility: Secondary | ICD-10-CM

## 2015-11-01 DIAGNOSIS — G8310 Monoplegia of lower limb affecting unspecified side: Secondary | ICD-10-CM

## 2015-11-01 DIAGNOSIS — I6930 Unspecified sequelae of cerebral infarction: Secondary | ICD-10-CM | POA: Diagnosis not present

## 2015-11-01 DIAGNOSIS — I10 Essential (primary) hypertension: Secondary | ICD-10-CM | POA: Insufficient documentation

## 2015-11-01 DIAGNOSIS — M62838 Other muscle spasm: Secondary | ICD-10-CM

## 2015-11-01 DIAGNOSIS — M6249 Contracture of muscle, multiple sites: Secondary | ICD-10-CM | POA: Diagnosis not present

## 2015-11-01 DIAGNOSIS — E119 Type 2 diabetes mellitus without complications: Secondary | ICD-10-CM | POA: Insufficient documentation

## 2015-11-01 MED ORDER — DANTROLENE SODIUM 25 MG PO CAPS: 25.0000 mg | ORAL_CAPSULE | Freq: Every day | ORAL | 1 refills | Status: AC

## 2015-11-01 NOTE — Progress Notes (Signed)
Subjective:    Patient ID: Sophia Rodriguez, female    DOB: 05/15/1950, 65 y.o.   MRN: [id number]  HPI  65 y/o female with pmh of strokes x3 (most recently ~2014), DM, HTN presents for follow up of spasticity.  The problem is mainly in her right leg.  It causes her difficulty with ambulation.  She uses a rolling walker currently and has not had any falls.  The problems started ~ 5 years ago getting worse over times. The biggest problems seems to be dragging of her foot.  She has tried an AFO, but she states it was too cumbersome and she did not notice a difference.  It gets worse at the end of the day.  She has some associated weakness, but denies numbness/tingling.   Previously she was prescribed Baclofen 5, which did not work and 10, which caused her too much drowsiness.  She had Botox, but this was not effective.   She was last seen in clinic on 10/04/15. Tizanidine was ordered, but it made her too drowsy.  She continues to stretch.  Pt was fitted for, but has not received her AFO yet.   Pain Inventory Average Pain 3 Pain Right Now 0 My pain is other  In the last 24 hours, has pain interfered with the following? General activity 3 Relation with others 1 Enjoyment of life 4 What TIME of day is your pain at its worst? evening Sleep (in general) Fair  Pain is worse with: walking, bending and some activites Pain improves with: rest, pacing activities and medication Relief from Meds: 9  Mobility use a walker ability to climb steps?  no do you drive?  no use a wheelchair  Function disabled: date disabled . I need assistance with the following:  household duties and shopping  Neuro/Psych trouble walking confusion depression anxiety  Prior Studies Any changes since last visit?  no  Physicians involved in your care Any changes since last visit?  no   Family History  Problem Relation Age of Onset  . Stroke Father   . Stroke Brother    Social History   Social History    . Marital status: Single    Spouse name: N/A  . Number of children: 2  . Years of education: Grad schoo   Social History Main Topics  . Smoking status: Former Smoker    Quit date: 05/28/2007  . Smokeless tobacco: Never Used  . Alcohol use No  . Drug use: No  . Sexual activity: Not Asked   Other Topics Concern  . None   Social History Narrative   Lives at home by herself.    Daughter come to help at home and she has a cleaning lady to help twice a week.    Caffeine: Every morning 1-1.5 cups coffee/day, and 12oz tea/day    Past Surgical History:  Procedure Laterality Date  . HIP ARTHROPLASTY Right 11/24/2012   Procedure: RIGHT HIP HEMIARTHROPLASTY;  Surgeon: Verlee Rossetti, MD;  Location: [facility];  Service: Orthopedics;  Laterality: Right;  hemiatroplasty, DePuy Triloc   Past Medical History:  Diagnosis Date  . Arthritis   . Diabetes mellitus   . Hypertension   . Stroke (HCC)    BP 127/83   Pulse 69   Resp 17   SpO2 94%   Opioid Risk Score:   Fall Risk Score:  `1  Depression screen PHQ 2/9  Depression screen [facility] 2/9 11/01/2015 07/13/2015 06/15/2015  Decreased Interest 0 1 1  Down, Depressed, Hopeless 0 1 1  PHQ - 2 Score 0 2 2  Altered sleeping - - 3  Tired, decreased energy - - 2  Change in appetite - - 0  Feeling bad or failure about yourself  - - 1  Trouble concentrating - - 3  Moving slowly or fidgety/restless - - 2  Suicidal thoughts - - 0  PHQ-9 Score - - 13  Difficult doing work/chores - - Somewhat difficult   Review of Systems  Musculoskeletal: Positive for gait problem.  Neurological: Positive for weakness.  Psychiatric/Behavioral: Positive for confusion and dysphoric mood. Memory loss. All other systems reviewed and are negative.     Objective:   Physical Exam HENT: Normocephalic, Atraumatic Eyes: EOMI, Conj WNL Cardio: S1, S2 normal, RRR Pulm: B/l clear to auscultation.  Effort normal Abd: Soft, non-distended, non-tender, BS+ MSK:  Gait right  toe walking on right.   No TTP.    No edema. Neuro: CN II-XII grossly intact, with mild right facial weakness    Sensation intact to light touch in all LE dermatomes  Reflexes 3+ RLE  Strength  4+/5 in RLE myotomes    5/5 in LLE myotomes MAS: Pt with difficulty relaxing and not resisting, however, ?slight increased tone RLE, more pronounced with ambulation. Skin: Warm and Dry    Assessment & Plan:  65 y/o female with pmh of strokes x3 (most recently ~3 years ago), DM, HTN presents for evaluation of spasticity.    1. RLE spasticity s/p stroke  Completed therapy without benefit  Pt states AFO makes her wear shoes all the time, but she is willing to try it.    Tizanidine causes drowziness  Encouraged muscle stretches  Could not perform Botox due to timing, will consider on next visit  Awaiting AFO  Will order dantrolene: 25 mg once daily for 7 days; increase to 25 mg 3 times daily for 7 days, increase to 50 mg 3 times daily for 7 days.  Asked pt to bring in recent lab work.  Labs work in EHR reviewed from 11/2014. Reviewed risks with patient.   2. Abnormality of gait  Toe walking with RLE  Cont walker for safety  RTC 1 month

## 2015-11-01 NOTE — Progress Notes (Deleted)
Subjective:     Patient ID: Sophia Rodriguez, female   DOB: 05-15-1950, 65 y.o.   MRN: [id number]  HPI  Pain Inventory Average Pain 3 Pain Right Now 0 My pain is other  In the last 24 hours, has pain interfered with the following? General activity 3 Relation with others 1 Enjoyment of life 4 What TIME of day is your pain at its worst? evening, night Sleep (in general) Fair  Pain is worse with: walking, bending and some activites Pain improves with: rest, pacing activities and medication Relief from Meds: 9  Mobility use a walker ability to climb steps?  no do you drive?  no use a wheelchair  Function disabled: date disabled NA I need assistance with the following:  household duties and shopping Do you have any goals in this area?  no  Neuro/Psych trouble walking confusion depression anxiety  Prior Studies Any changes since last visit?  no  Physicians involved in your care Any changes since last visit?  no   Family History  Problem Relation Age of Onset  . Stroke Father   . Stroke Brother    Social History   Social History  . Marital status: Single    Spouse name: N/A  . Number of children: 2  . Years of education: Grad schoo   Social History Main Topics  . Smoking status: Former Smoker    Quit date: 05/28/2007  . Smokeless tobacco: Never Used  . Alcohol use No  . Drug use: No  . Sexual activity: Not Asked   Other Topics Concern  . None   Social History Narrative   Lives at home by herself.    Daughter come to help at home and she has a cleaning lady to help twice a week.    Caffeine: Every morning 1-1.5 cups coffee/day, and 12oz tea/day    Past Surgical History:  Procedure Laterality Date  . HIP ARTHROPLASTY Right 11/24/2012   Procedure: RIGHT HIP HEMIARTHROPLASTY;  Surgeon: Verlee RossettiSteven R Norris, MD;  Location: [facility];  Service: Orthopedics;  Laterality: Right;  hemiatroplasty, DePuy Triloc   Past Medical History:  Diagnosis Date  . Arthritis   .  Diabetes mellitus   . Hypertension   . Stroke (HCC)    BP 127/83   Pulse 69   Resp 17   SpO2 94%   Opioid Risk Score:   Fall Risk Score:  `1  Depression screen PHQ 2/9  [facility] 07/13/2015 06/15/2015  Decreased Interest 1 1  Down, Depressed, Hopeless 1 1  PHQ - 2 Score 2 2  Altered sleeping - 3  Tired, decreased energy - 2  Change in appetite - 0  Feeling bad or failure about yourself  - 1  Trouble concentrating - 3  Moving slowly or fidgety/restless - 2  Suicidal thoughts - 0  PHQ-9 Score - 13  Difficult doing work/chores - Somewhat difficult   Review of Systems     Objective:   Physical Exam     Assessment:     ***    Plan:     ***

## 2015-11-29 ENCOUNTER — Encounter: Payer: Self-pay | Admitting: Physical Medicine & Rehabilitation

## 2015-11-29 ENCOUNTER — Encounter: Payer: Medicare Other | Attending: Physical Medicine & Rehabilitation | Admitting: Physical Medicine & Rehabilitation

## 2015-11-29 VITALS — BP 122/83 | HR 80 | Resp 17

## 2015-11-29 DIAGNOSIS — M6249 Contracture of muscle, multiple sites: Secondary | ICD-10-CM

## 2015-11-29 DIAGNOSIS — R269 Unspecified abnormalities of gait and mobility: Secondary | ICD-10-CM | POA: Diagnosis not present

## 2015-11-29 DIAGNOSIS — E119 Type 2 diabetes mellitus without complications: Secondary | ICD-10-CM | POA: Insufficient documentation

## 2015-11-29 DIAGNOSIS — I69898 Other sequelae of other cerebrovascular disease: Secondary | ICD-10-CM | POA: Diagnosis present

## 2015-11-29 DIAGNOSIS — I6930 Unspecified sequelae of cerebral infarction: Secondary | ICD-10-CM

## 2015-11-29 DIAGNOSIS — I10 Essential (primary) hypertension: Secondary | ICD-10-CM | POA: Insufficient documentation

## 2015-11-29 DIAGNOSIS — Z8673 Personal history of transient ischemic attack (TIA), and cerebral infarction without residual deficits: Secondary | ICD-10-CM | POA: Insufficient documentation

## 2015-11-29 DIAGNOSIS — G8310 Monoplegia of lower limb affecting unspecified side: Secondary | ICD-10-CM | POA: Diagnosis not present

## 2015-11-29 DIAGNOSIS — M62838 Other muscle spasm: Secondary | ICD-10-CM

## 2015-11-29 NOTE — Progress Notes (Signed)
Subjective:    Patient ID: Sophia Rodriguez, female    DOB: November 29, 1950, 65 y.o.   MRN: [id number]  HPI  65 y/o female with pmh of strokes x3 (most recently ~2014), DM, HTN presents for follow up of spasticity.  The problem is mainly in her right leg.  It causes her difficulty with ambulation.  She uses a rolling walker currently and has not had any falls.  The problems started ~ 5 years ago getting worse over times. The biggest problems seems to be dragging of her foot.  She has tried an AFO, but she states it was too cumbersome and she did not notice a difference.  It gets worse at the end of the day.  She has some associated weakness, but denies numbness/tingling.   Previously she was prescribed Baclofen 5, which did not work and 10, which caused her too much drowsiness.  She had Botox, but this was not effective, tizanidine made her too drowsy.   She was last seen in clinic on 11/01/15. At that time she was awaiting her AFO, which she has received, but states that it does not fit into any of her shoes. She goes to her PCP next week for lab work.  She just received the dantrolene, for which she has only taken 3 doses, but has not noticed a difference.    Pain Inventory Average Pain 4 Pain Right Now 0 My pain is NA  In the last 24 hours, has pain interfered with the following? General activity 6 Relation with others 4 Enjoyment of life 5 What TIME of day is your pain at its worst? evening Sleep (in general) Fair  Pain is worse with: bending Pain improves with: pacing activities and medication Relief from Meds: 5  Mobility use a walker ability to climb steps?  no do you drive?  no use a wheelchair  Function I need assistance with the following:  household duties and shopping  Neuro/Psych trouble walking depression anxiety  Prior Studies Any changes since last visit?  no  Physicians involved in your care Any changes since last visit?  no   Family History  Problem  Relation Age of Onset  . Stroke Father   . Stroke Brother    Social History   Social History  . Marital status: Single    Spouse name: N/A  . Number of children: 2  . Years of education: Grad schoo   Social History Main Topics  . Smoking status: Former Smoker    Quit date: 05/28/2007  . Smokeless tobacco: Never Used  . Alcohol use No  . Drug use: No  . Sexual activity: Not Asked   Other Topics Concern  . None   Social History Narrative   Lives at home by herself.    Daughter come to help at home and she has a cleaning lady to help twice a week.    Caffeine: Every morning 1-1.5 cups coffee/day, and 12oz tea/day    Past Surgical History:  Procedure Laterality Date  . HIP ARTHROPLASTY Right 11/24/2012   Procedure: RIGHT HIP HEMIARTHROPLASTY;  Surgeon: Verlee RossettiSteven R Norris, MD;  Location: [facility];  Service: Orthopedics;  Laterality: Right;  hemiatroplasty, DePuy Triloc   Past Medical History:  Diagnosis Date  . Arthritis   . Diabetes mellitus   . Hypertension   . Stroke [facility](HCC)    There were no vitals taken for this visit.  Opioid Risk Score:   Fall Risk Score:  `1  Depression screen  PHQ 2/9  Depression screen [facility] 2/9 11/01/2015 07/13/2015 06/15/2015  Decreased Interest 0 1 1  Down, Depressed, Hopeless 0 1 1  PHQ - 2 Score 0 2 2  Altered sleeping - - 3  Tired, decreased energy - - 2  Change in appetite - - 0  Feeling bad or failure about yourself  - - 1  Trouble concentrating - - 3  Moving slowly or fidgety/restless - - 2  Suicidal thoughts - - 0  PHQ-9 Score - - 13  Difficult doing work/chores - - Somewhat difficult   Review of Systems  Musculoskeletal: Positive for gait problem.  Neurological: Positive for weakness.  Psychiatric/Behavioral: Positive for dysphoric mood. Memory loss. All other systems reviewed and are negative.     Objective:   Physical Exam HENT: Normocephalic, Atraumatic Eyes: EOMI, Conj WNL Cardio: S1, S2 normal, RRR Pulm: B/l clear to  auscultation.  Effort normal Abd: Soft, non-distended, non-tender, BS+ MSK:  Gait right toe walking on right.   No TTP.    No edema. Neuro: CN II-XII grossly intact, with mild right facial weakness    Sensation intact to light touch in all LE dermatomes  Reflexes 3+ RLE  Strength  4+/5 in RLE myotomes    5/5 in LLE myotomes MAS: Pt with difficulty relaxing and not resisting, however, ?slight increased tone RLE, more pronounced with ambulation. Skin: Warm and Dry    Assessment & Plan:  65 y/o female with pmh of strokes x3 (most recently ~3 years ago), DM, HTN presents for evaluation of spasticity.   1. RLE spasticity s/p stroke  Completed therapy without benefit  Side effects with Tizanidine, Baclofen  Encouraged muscle stretches.    Will consider increase in Botox dose next visit after monitoring effects of dantroleneCould not perform Botox due to timing, will consider on next visit  AFO does not fit in shoe, pt to follow up for adjustment with [employer]  Cont dantrolene: 25 mg once daily for 7 days; increase to 25 mg 3 times daily for 7 days, increase to 50 mg 3 times daily for 7 days.  Pt to have labs drawn next week by PCP, requested to send over results (LFTs).  Labs work in EHR reviewed from 11/2014. Reviewed risks with patient.   Will refer to PT for gait training with AFO.    2. Abnormality of gait  Toe walking with RLE  Cont walker for safety  RTC 1 month

## 2015-12-12 ENCOUNTER — Ambulatory Visit (INDEPENDENT_AMBULATORY_CARE_PROVIDER_SITE_OTHER): Payer: Medicare Other | Admitting: Adult Health

## 2015-12-12 ENCOUNTER — Encounter: Payer: Self-pay | Admitting: Adult Health

## 2015-12-12 VITALS — BP 122/80 | HR 80 | Ht 67.0 in

## 2015-12-12 DIAGNOSIS — Z8673 Personal history of transient ischemic attack (TIA), and cerebral infarction without residual deficits: Secondary | ICD-10-CM

## 2015-12-12 DIAGNOSIS — R269 Unspecified abnormalities of gait and mobility: Secondary | ICD-10-CM | POA: Diagnosis not present

## 2015-12-12 DIAGNOSIS — G8111 Spastic hemiplegia affecting right dominant side: Secondary | ICD-10-CM | POA: Diagnosis not present

## 2015-12-12 NOTE — Progress Notes (Addendum)
PATIENT: Sophia Rodriguez DOB: [birth year]  REASON FOR VISIT: follow up- stroke HISTORY FROM: patient  HISTORY OF PRESENT ILLNESS: Today 12/12/2015: Mr. Sophia Rodriguez is a 65 year old female with a history of multiple strokes. She returns today for follow-up. Patient reports that she has been seen by Dr. Allena Katz at [facility] rehabilitation. She continues to take aspirin and is tolerating it well. Patient's blood pressure is in normal range. Her primary care is managing her cholesterol and diabetes. She states that she is now off of her diabetes medication. Her last hemoglobin A1c was 5.6%. She denies any additional strokelike symptoms. She reports that she uses a walker and a wheelchair. She states for longer distances she depends on the wheelchair.  she has tried Botox for muscle spasticity. She states that she has only had 1 injection and did not find it beneficial. Reports that Dr. Allena Katz is considering giving her a higher dose next time. She denies any new neurological symptoms. She returns today for an evaluation.  Interval (MM) 06/11/2015: [name] is a 65 year old female with a history of multiple strokes. She returns today for follow-up. Since her strokes the patient continues to have difficulty walking. She reports that she drags the right leg. At her last visit with Dr. Lucia Gaskins she was referred to physical therapy. She states that she had to cancel this appointment because her father passed away. She has not had a chance to reschedule this. She reports that she has only had 1 fall. She states this happpened when she was getting her suitcase down- she fell backwards onto blankets. Fortunately she did not suffer any injuries. She continues to take aspirin for stroke prevention. Her blood pressure, cholesterol and diabetes are managed by her primary care provider. She states that her most recent hemoglobin A1c was 6.3%. Her blood pressure is in normal range today. She denies any new strokelike symptoms.  She returns today for an evaluation.  HISTORY [facility]): Interval history: She does not feel that she got any better with PT. She is still having difficulty walking. She is dragging the right leg. She has stiffness and cramps in the right leg. No falls recently. She is using her walker all the time. She takes neurontin in the morning and night. Her depression comes and goes but she is feeling better and Dr. Pearlean Brownie increased her celexa. She has a right foot orthosis but doesn't wear it. Will refer to kirsteins and swartz.   MRi brain 12/01/2014: FINDINGS: Diffuse prominence of the CSF containing spaces is compatible with generalized cerebral atrophy. Patchy and confluent T2/FLAIR hyperintensity within the periventricular and deep white matter is most consistent with chronic small vessel ischemic changes. Encephalomalacia within the parasagittal left frontal lobe most compatible with remote left ACA territory infarct. Infarct involves the body of the corpus callosum and cingulate. Additional scattered remote lacunar infarcts within the bilateral basal ganglia, pons, and right cerebellar hemisphere. Remote infarct within the right thalamus as well. Infarcts within the right thalamus and right cerebellum appear new from previous.  No abnormal foci of restricted diffusion to suggest acute intracranial infarct. Gray-white matter differentiation maintained. Normal intravascular flow voids are preserved. No acute intracranial hemorrhage.  No mass lesion, midline shift, or mass effect. Mild ex vacuo dilatation of the left lateral ventricle related to the left frontal lobe infarct. No hydrocephalus. No extra-axial fluid collection.  Craniocervical junction within normal limits. Scattered multilevel degenerative changes present within the visualized upper cervical spine without significant stenosis. Pituitary gland normal.  No acute abnormality about the orbits.  Retention cyst noted within the right  maxillary sinus. Scattered mucosal thickening within the ethmoidal air cells. Paranasal sinuses are otherwise clear. No mastoid effusion. Inner ear structures grossly normal.  Bone marrow signal intensity within normal limits. Scalp soft tissues unremarkable.  IMPRESSION: 1. No acute intracranial infarct or other process identified. 2. Generalized cerebral atrophy with chronic microvascular ischemic disease and multiple remote infarcts as above.  Update 11/08/2014 Dr. Pearlean Brownie : She returns for follow-up after last visit 6 months ago. She continues to have difficulty walking with stiffness and cramps and pain in the right leg with dragging. She's had balance issues with a few falls recently but fortunately has not hurt herself significantly. She said multiple strokes in the past and has had trouble walking and balance issues following each stroke. She was seen last by Dr. Daisy Blossom who recommended home physical therapy but this has not started yet. Patient states that time she gets disoriented loses track of times up to 30 minutes this may happen daily while she was watching TV or sitting quietly. She admits to feeling depressed and has been on Celexa 10 mg daily for last few years and has never tried a higher dose. She also takes gabapentin but only 300 mg once a day. She denies tingling numbness and burning in her feet but she did have no conduction study done by Dr. 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 She does have history of diabetes in the past but she has been off medications for a year and a half.   HPI: Sophia Rodriguez is a 65 y.o. female here as a referral from Dr. Concepcion Elk for stroke. She has a PMHx of HTN, DM. 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 First stroke 5 years ago. Had 2 a few years ago. She is compliant with medications. Her gait has worsened. She used to use a cane and now she is using a walker. She can't get  around like she used to. She feels weak. She is having word-finding difficulty. Sometimes when she is laying in bed it hurts in her rightear, no dizziness or room spinning. It just hurts. She can't sleep with her right arm extended because the arm goes numb. (Very poor historian, psychomotor slowing). She can't close her right hand all the way, some numbness in the fingers. She has difficulty using right arm. Sometimes her left hand "goes and goes and goes" and she can't stop it. She has to stop herself by touching it, if she makes a concerted effort she can stop her hand. She has neck pain and when she moves it a certain way she can feel pain but no radicular symptoms. She lives alone in her apartment. She has a cleaning person, daughter helps with medications. She babysits her grandaughter every day, she is 8. No falls. She is on aspirin for stroke prevention, reports she was not on aspirin during her last strokes. She has a primary care physician that she likes very much and manages her medications, cholesterol, glucose and blood pressure.   Reviewed notes, labs and imaging from outside physicians, which showed:   CT of the head 12/2012: A remote infarct is again noted at the left frontal lobe, with associated encephalomalacia. This is grossly unchanged in appearance. There is mild ex vacuo dilatation of the frontal horn of the left lateral ventricle.  Scattered periventricular white matter change likely reflects small vessel ischemic microangiopathy.  The previously noted small infarct at the posterior limb of the right internal capsule is not well characterized.  The posterior fossa, including the cerebellum, brainstem and fourth ventricle, is within normal limits. No mass effect or midline shift is seen.  There is no evidence of fracture; visualized osseous structures are unremarkable in appearance. The orbits are within normal limits. The paranasal sinuses and mastoid air cells are  well-aerated. No significant soft tissue abnormalities are seen.  IMPRESSION:  1. No acute intracranial pathology seen to explain the patient's symptoms. 2. Remote infarct again noted at the left frontal lobe, with associated encephalomalacia and mild ex vacuo dilatation of the frontal horn of the left lateral ventricle. 3. Scattered small vessel ischemic microangiopathy.  MRI of the brain 09/2012: IMPRESSION: 1. Acute ischemic infarct involving the posterior limb of the right internal capsule, at or just adjacent to recently identified acute ischemic infarct seen in this region on MRI from 08/26/2012. 2. Stable appearance of additional remote infarcts as detailed Above.  (Reviewed images and agree with above findings.)  MRA of the head 08/2012: MRA HEAD  Findings: Cavernous segment internal carotid artery narrowing and irregularity more notable on the left.  Hypoplastic A1 segment right anterior cerebral artery with narrowing and irregularity suggesting superimposed atherosclerotic type changes.  Mild narrowing M1 segment right middle cerebral artery.  Middle cerebral artery branch vessel mild to moderate irregularity narrowing.  Mild irregularity of the A2 segment of the anterior cerebral artery bilaterally.  Ectatic vertebral arteries and basilar artery. The left vertebral artery is dominant. Very mild narrowing of the left vertebral artery after the takeoff of the left PICA. Mild narrowing distal right vertebral artery.  Irregularity and narrowing of the right PICA which is smaller than the left PICA.  Mild irregularity of the basilar artery without high-grade stenosis. Nonvisualization AICA bilaterally.  Moderate narrowing the superior cerebellar artery bilaterally.  Mild narrowing proximal right posterior cerebral artery. P2 segment the posterior cerebral artery mild to moderate irregularity greater on the right. Posterior cerebral artery distal branch  vessel narrowing irregularity bilaterally.  No aneurysm or vascular malformation noted.  MRA of the neck: 08/2012:   IMPRESSION: No evidence hemodynamically significant stenosis involving either carotid bifurcation.  Left vertebral artery is dominant. Ectatic proximal left vertebral artery with fold and mild narrowing. Minimal narrowing proximal right vertebral artery.  Cbc 12/2012 with anemia, bmp 12/2012 with gfr 76 (N>90). No recent labs available to review.  2d echo and carotid dopplers 08/2012: Study Conclusions  Left ventricle: The cavity size was normal. Wall thickness was normal. Systolic function was normal. The estimated ejection fraction was in the range of 60% to 65%. Wall motion was normal; there were no regional wall motion abnormalities.   Carotid Summary: - Findings consistent with less than 39 percent stenosis involving the right internal carotid artery and the left internal carotid artery. - Bilateral vertebral artery flow antegrade.  REVIEW OF SYSTEMS: Out of a complete 14 system review of symptoms, the patient complains only of the following symptoms, and all other reviewed systems are negative.  Insomnia, walking difficulty, depression, memory loss, headache  ALLERGIES: No Known Allergies  HOME MEDICATIONS: Outpatient Medications Prior to Visit  Medication Sig Dispense Refill  . aspirin 325 MG tablet Take 1 tablet (325 mg total) by mouth daily.    Marland Kitchen atorvastatin (LIPITOR) 40 MG tablet Take 1 tablet (40 mg total) by mouth daily at 6 PM. 30 tablet 1  . baclofen (LIORESAL) 10 MG tablet Take  0.5 tablets (5 mg total) by mouth 3 (three) times daily. 30 each 0  . cetirizine (ZYRTEC) 10 MG tablet Take 10 mg by mouth daily.    . citalopram (CELEXA) 10 MG tablet Take 10 mg by mouth daily.    . dantrolene (DANTRIUM) 25 MG capsule Take 1 capsule (25 mg total) by mouth daily. 25 mg once daily for 7 days; increase to 25 mg 3 times daily for 7 days, increase  to 50 mg 3 times daily for 7 days 30 capsule 1  . furosemide (LASIX) 20 MG tablet TK 1 T PO  QD PRF LEG SWELLING  0  . gabapentin (NEURONTIN) 300 MG capsule Take 1 capsule (300 mg total) by mouth 3 (three) times daily. 90 capsule 3  . lisinopril (PRINIVIL,ZESTRIL) 10 MG tablet Take 10 mg by mouth daily.    . Multiple Vitamin (MULTIVITAMIN WITH MINERALS) TABS Take 1 tablet by mouth daily.    Marland Kitchen olopatadine (PATANOL) 0.1 % ophthalmic solution INSTILL 1 GTT IN OU BID  12  . tizanidine (ZANAFLEX) 2 MG capsule Take 1 capsule (2 mg total) by mouth 3 (three) times daily as needed. 270 capsule 0  . Vitamin D, Ergocalciferol, (DRISDOL) 50000 UNITS CAPS capsule Take 50,000 Units by mouth every 7 (seven) days.     . VOLTAREN 1 % GEL Apply 1 application topically as needed.  5  . zolpidem (AMBIEN) 10 MG tablet Take 10 mg by mouth at bedtime as needed.  5   No facility-administered medications prior to visit.     PAST MEDICAL HISTORY: Past Medical History:  Diagnosis Date  . Arthritis   . Diabetes mellitus   . Hypertension   . Stroke [facility])     PAST SURGICAL HISTORY: Past Surgical History:  Procedure Laterality Date  . HIP ARTHROPLASTY Right 11/24/2012   Procedure: RIGHT HIP HEMIARTHROPLASTY;  Surgeon: Verlee Rossetti, MD;  Location: [facility];  Service: Orthopedics;  Laterality: Right;  hemiatroplasty, DePuy Triloc    FAMILY HISTORY: Family History  Problem Relation Age of Onset  . Stroke Father   . Stroke Brother     SOCIAL HISTORY: Social History   Social History  . Marital status: Single    Spouse name: N/A  . Number of children: 2  . Years of education: Grad schoo   Occupational History  . Not on file.   Social History Main Topics  . Smoking status: Former Smoker    Quit date: 05/28/2007  . Smokeless tobacco: Never Used  . Alcohol use No  . Drug use: No  . Sexual activity: Not on file   Other Topics Concern  . Not on file   Social History Narrative   Lives at home by  herself.    Daughter come to help at home and she has a cleaning lady to help twice a week.    Caffeine: Every morning 1-1.5 cups coffee/day, and 12oz tea/day       PHYSICAL EXAM  Vitals:   12/12/15 0922  BP: 122/80  Pulse: 80  Height: 5\' 7"  (1.702 m)   There is no height or weight on file to calculate BMI.  Generalized: Well developed, in no acute distress   Neurological examination  Mentation: Alert oriented to time, place, history taking. Follows all commands speech and language fluent Cranial nerve II-XII: Pupils were equal round reactive to light. Extraocular movements were full, visual field were full on confrontational test. Facial sensation and strength were normal. Uvula tongue midline.  Head turning and shoulder shrug  were normal and symmetric. Motor: Good strength in all extremities except slight weakness in the right lower extremity. Sensory: Sensory testing is intact to soft touch on all 4 extremities. No evidence of extinction is noted.  Coordination: Cerebellar testing reveals good finger-nose-finger and heel-to-shin bilaterally.  Gait and station: Patient is in a wheelchair today. Reflexes: Deep tendon reflexes are symmetric and normal bilaterally.   DIAGNOSTIC DATA (LABS, IMAGING, TESTING) - I reviewed patient records, labs, notes, testing and imaging myself where available.  Lab Results  Component Value Date   WBC 7.1 12/01/2014   HGB 17.7 (H) 12/01/2014   HCT 52.0 (H) 12/01/2014   MCV 81.1 12/01/2014   PLT 250 12/01/2014      Component Value Date/Time   NA 141 12/01/2014 1811   K 3.9 12/01/2014 1811   CL 105 12/01/2014 1811   CO2 22 12/01/2014 1802   GLUCOSE 130 (H) 12/01/2014 1811   BUN 15 12/01/2014 1811   CREATININE 1.10 (H) 12/01/2014 1811   CALCIUM 9.9 12/01/2014 1802   PROT 8.1 12/01/2014 1802   PROT 7.4 11/22/2014 0932   ALBUMIN 3.9 12/01/2014 1802   AST 30 12/01/2014 1802   ALT 21 12/01/2014 1802   ALKPHOS 100 12/01/2014 1802   BILITOT  0.5 12/01/2014 1802   GFRNONAA 48 (L) 12/01/2014 1802   GFRAA 55 (L) 12/01/2014 1802    Lab Results  Component Value Date   HGBA1C 7.5 (H) 11/22/2014   Lab Results  Component Value Date   VITAMINB12 1,741 (H) 11/22/2014   Lab Results  Component Value Date   TSH 0.774 11/22/2014      ASSESSMENT AND PLAN 65 y.o. year old female  has a past medical history of Arthritis; Diabetes mellitus; Hypertension; and Stroke [facility](HCC). here with:  1. History of stroke 2. Abnormality of gait 3. Right spastic hemiparesis  Overall the patient has remained stable. She will continue on aspirin for stroke prevention. She should maintain strict control of her blood pressure with goal less than 130/90, cholesterol LDL less than 70 and hemoglobin A1c less than 6.5%. Patient is encouraged to remain active and participate in physical therapy. She voiced understanding. If has any strokelike symptoms she should go to the emergency room immediately. She will follow-up in 6 months with Dr. Gerlene FeeAhern   Navi Ewton, MSN, NP-C 12/12/2015, 9:29 AM [facility] [address], [address] [place], [place] [address] [phone]  Personally have participated in and made any corrections needed to history, physical, neuro exam,assessment and plan as stated above.  I have personally discussed with NP, evaluated lab date, reviewed imaging studies and agree with radiology interpretations.    Naomie DeanAntonia Ahern, MD [facility]

## 2015-12-12 NOTE — Patient Instructions (Signed)
Continue Aspirin  Blood pressure goal <130/90 Cholesterol LDL <100 HgbA1c <6.5 If your symptoms worsen or you develop new symptoms please let us know.

## 2015-12-27 ENCOUNTER — Encounter: Payer: Medicare Other | Attending: Physical Medicine & Rehabilitation | Admitting: Registered Nurse

## 2015-12-27 DIAGNOSIS — I69898 Other sequelae of other cerebrovascular disease: Secondary | ICD-10-CM | POA: Insufficient documentation

## 2015-12-27 DIAGNOSIS — E119 Type 2 diabetes mellitus without complications: Secondary | ICD-10-CM | POA: Insufficient documentation

## 2015-12-27 DIAGNOSIS — Z8673 Personal history of transient ischemic attack (TIA), and cerebral infarction without residual deficits: Secondary | ICD-10-CM | POA: Insufficient documentation

## 2015-12-27 DIAGNOSIS — I10 Essential (primary) hypertension: Secondary | ICD-10-CM | POA: Insufficient documentation

## 2015-12-27 DIAGNOSIS — R269 Unspecified abnormalities of gait and mobility: Secondary | ICD-10-CM | POA: Insufficient documentation

## 2016-01-03 ENCOUNTER — Telehealth: Payer: Self-pay | Admitting: *Deleted

## 2016-01-03 ENCOUNTER — Encounter: Payer: Self-pay | Admitting: Registered Nurse

## 2016-01-03 ENCOUNTER — Encounter (HOSPITAL_BASED_OUTPATIENT_CLINIC_OR_DEPARTMENT_OTHER): Payer: Medicare Other | Admitting: Registered Nurse

## 2016-01-03 VITALS — BP 126/87 | HR 94

## 2016-01-03 DIAGNOSIS — Z8673 Personal history of transient ischemic attack (TIA), and cerebral infarction without residual deficits: Secondary | ICD-10-CM | POA: Diagnosis not present

## 2016-01-03 DIAGNOSIS — R269 Unspecified abnormalities of gait and mobility: Secondary | ICD-10-CM

## 2016-01-03 DIAGNOSIS — M62838 Other muscle spasm: Secondary | ICD-10-CM

## 2016-01-03 DIAGNOSIS — I69359 Hemiplegia and hemiparesis following cerebral infarction affecting unspecified side: Secondary | ICD-10-CM

## 2016-01-03 DIAGNOSIS — G8310 Monoplegia of lower limb affecting unspecified side: Secondary | ICD-10-CM

## 2016-01-03 DIAGNOSIS — I69898 Other sequelae of other cerebrovascular disease: Secondary | ICD-10-CM | POA: Diagnosis present

## 2016-01-03 DIAGNOSIS — I6930 Unspecified sequelae of cerebral infarction: Secondary | ICD-10-CM

## 2016-01-03 DIAGNOSIS — I10 Essential (primary) hypertension: Secondary | ICD-10-CM | POA: Diagnosis not present

## 2016-01-03 DIAGNOSIS — E119 Type 2 diabetes mellitus without complications: Secondary | ICD-10-CM | POA: Diagnosis not present

## 2016-01-03 NOTE — Telephone Encounter (Signed)
Forwarded to [place] for review.....FYI

## 2016-01-03 NOTE — Telephone Encounter (Signed)
Have spoken to patient on several occasions regarding prescriptions and need for LFTs to determine length of therapy. Pt has repeatedly stated she would bring labs from her PCP to avoid another lab draw.  Will discuss with patient on next visit.  Thanks.

## 2016-01-03 NOTE — Progress Notes (Signed)
Subjective:    Patient ID: Vanice Sarah, female    DOB: 21-Jul-1950, 65 y.o.   MRN: [id number]  HPI: Ms. Karley Pho is a 65 year old female who returns for follow up appointment. She denies any pain. Her current exercise regime is walking.   PMH hemiparesis affecting dominant side as late effect of stroke, last Botox 07/13/2015. She was prescribed Dantrium with instructions, she states when she increased her dose she became sedated.  Office staff called her pharmacy last prescription was picked up on 11/25/2015. I was in contact with Dr. Allena Katz he is aware of the above. The office staff will try to obtain her blood work from her PCP, her daughter was called and asked if she would accompany her mother at her next visit with Dr. Allena Katz.   She has ordered a new shoe so she will be able to wear her AFO.    Pain Inventory Average Pain 3 Pain Right Now 0 My pain is aching  In the last 24 hours, has pain interfered with the following? General activity 6 Relation with others 0 Enjoyment of life 3 What TIME of day is your pain at its worst? evening Sleep (in general) NA  Pain is worse with: walking and standing Pain improves with: rest Relief from Meds: 5  Mobility use a walker how many minutes can you walk? 2 ability to climb steps?  no do you drive?  no use a wheelchair Do you have any goals in this area?  no  Function I need assistance with the following:  household duties and shopping Do you have any goals in this area?  no  Neuro/Psych confusion depression  Prior Studies Any changes since last visit?  no  Physicians involved in your care Any changes since last visit?  no   Family History  Problem Relation Age of Onset  . Stroke Father   . Stroke Brother    Social History   Social History  . Marital status: Single    Spouse name: N/A  . Number of children: 2  . Years of education: Grad schoo   Social History Main Topics  . Smoking status: Former Smoker    Quit date: 05/28/2007  . Smokeless tobacco: Never Used  . Alcohol use No  . Drug use: No  . Sexual activity: Not on file   Other Topics Concern  . Not on file   Social History Narrative   Lives at home by herself.    Daughter come to help at home and she has a cleaning lady to help twice a week.    Caffeine: Every morning 1-1.5 cups coffee/day, and 12oz tea/day    Past Surgical History:  Procedure Laterality Date  . HIP ARTHROPLASTY Right 11/24/2012   Procedure: RIGHT HIP HEMIARTHROPLASTY;  Surgeon: Verlee Rossetti, MD;  Location: [facility];  Service: Orthopedics;  Laterality: Right;  hemiatroplasty, DePuy Triloc   Past Medical History:  Diagnosis Date  . Arthritis   . Diabetes mellitus   . Hypertension   . Stroke [facility])    There were no vitals taken for this visit.  Opioid Risk Score:   Fall Risk Score:  `1  Depression screen PHQ 2/9  Depression screen [facility] 2/9 11/01/2015 07/13/2015 06/15/2015  Decreased Interest 0 1 1  Down, Depressed, Hopeless 0 1 1  PHQ - 2 Score 0 2 2  Altered sleeping - - 3  Tired, decreased energy - - 2  Change in appetite - - 0  Feeling bad or failure about yourself  - - 1  Trouble concentrating - - 3  Moving slowly or fidgety/restless - - 2  Suicidal thoughts - - 0  PHQ-9 Score - - 13  Difficult doing work/chores - - Somewhat difficult     Review of Systems  Psychiatric/Behavioral: Positive for confusion and dysphoric mood.  All other systems reviewed and are negative.      Objective:   Physical Exam  Constitutional: She is oriented to person, place, and time. She appears well-developed and well-nourished.  HENT:  Head: Normocephalic and atraumatic.  Neck: Normal range of motion. Neck supple.  Cardiovascular: Normal rate and regular rhythm.   Pulmonary/Chest: Effort normal and breath sounds normal.  Musculoskeletal:  Normal Muscle Bulk and Muscle Testing Reveals: Upper Extremities: Full ROM and Muscle Strength 5/5 Lower Extremities:  Right: Decreased ROM and Muscle Strength 4/5 Left: Full ROM and Muscle Strength 5/5 Right foot drop gait Using walker for support    Neurological: She is alert and oriented to person, place, and time.  Skin: Skin is warm and dry.  Psychiatric: She has a normal mood and affect.  Nursing note and vitals reviewed.         Assessment & Plan:  1. RLE spasticity s/p stroke: Schedule for Botox with Dr. Allena KatzPatel Awaiting her new shoe to wear AFO at that time she will attend physical therapy for gait trainig per Dr. Allena KatzPatel note 2. Abnormality of gait:  Cont walker for safety  40 minutes of face to face patient care time was spent during this visit. All questions were encouraged and answered.   F/U with Dr. Allena KatzPatel

## 2016-01-03 NOTE — Telephone Encounter (Signed)
Patient here for a visit, she is upset about lack of response over the confusion surrounding her Rx to dantrolene.  She was prescribed 30 tablets and the sig indicated a titration of one 25 mg tablet a day for 7 days, followed  by three 25 mg tablets a day for 7 days, followed by an increase to 50 mg tablets a day for 7 days with no indication to continue.  The amount dispensed does not support the directions.  Please advise ASAP

## 2016-01-03 NOTE — Telephone Encounter (Signed)
Spoke with Dr. Fleet ContrasEdwin Avbuere office at [phone] and requested most recent lab results be faxed to us.

## 2016-01-03 NOTE — Telephone Encounter (Signed)
Contacted pt's daughter she agreed to come with her mother to her next appointment. She also updated the phone information to get hold of the patient [phone]).

## 2016-01-03 NOTE — Telephone Encounter (Signed)
Spoke with patient about her upcoming visit and expressed Dr. Eliane DecreePatel's desire to make sure pt's daughter comes to appointment.  I asked the patient about her AFO.  It was picked up and attempted to be fitted.  The shoes she had would not accomodate the brace. She returned the shoes and is awaiting the order for her new shoes to arrive. When they do, she will make an appt for a fitting....FYI

## 2016-01-11 ENCOUNTER — Encounter (HOSPITAL_BASED_OUTPATIENT_CLINIC_OR_DEPARTMENT_OTHER): Payer: Medicare Other | Admitting: Physical Medicine & Rehabilitation

## 2016-01-11 ENCOUNTER — Encounter: Payer: Self-pay | Admitting: Physical Medicine & Rehabilitation

## 2016-01-11 VITALS — BP 129/78 | HR 84 | Resp 16

## 2016-01-11 DIAGNOSIS — M62838 Other muscle spasm: Secondary | ICD-10-CM | POA: Diagnosis not present

## 2016-01-11 DIAGNOSIS — I6930 Unspecified sequelae of cerebral infarction: Secondary | ICD-10-CM | POA: Diagnosis not present

## 2016-01-11 DIAGNOSIS — G8310 Monoplegia of lower limb affecting unspecified side: Secondary | ICD-10-CM | POA: Diagnosis not present

## 2016-01-11 DIAGNOSIS — I69898 Other sequelae of other cerebrovascular disease: Secondary | ICD-10-CM | POA: Diagnosis not present

## 2016-01-11 DIAGNOSIS — R269 Unspecified abnormalities of gait and mobility: Secondary | ICD-10-CM | POA: Diagnosis not present

## 2016-01-11 NOTE — Progress Notes (Signed)
Subjective:    Patient ID: Sophia Rodriguez, female    DOB: [birth year], 65 y.o.   MRN: [id number]  HPI  66 y/o female with pmh of strokes x3 (most recently ~2014), DM, HTN presents for follow up of spasticity.  The problem is mainly in her right leg.  It causes her difficulty with ambulation.  She uses a rolling walker currently and has not had any falls.  The problems started ~ 5 years ago getting worse over times. The biggest problems seems to be dragging of her foot.  It gets worse at the end of the day.  She has some associated weakness, but denies numbness/tingling.   Previously she was prescribed Baclofen 5, which did not work and 10, which caused her too much drowsiness.  She had Botox, but this was not effective, tizanidine made her too drowsy.   She was last seen in clinic on 01/03/16. She has received her brace.  Pt's daughter present today.  Brace appears to have helped, pt's daughter is in agreement, but patient unsure.    Pain Inventory Average Pain 4 Pain Right Now 0 My pain is NA  In the last 24 hours, has pain interfered with the following? General activity 4 Relation with others 0 Enjoyment of life 2 What TIME of day is your pain at its worst? evening and night Sleep (in general) NA  Pain is worse with: walking Pain improves with: rest and medication Relief from Meds: 6  Mobility use a walker ability to climb steps?  no do you drive?  no use a wheelchair  Function I need assistance with the following:  household duties and shopping  Neuro/Psych depression anxiety  Prior Studies Any changes since last visit?  no  Physicians involved in your care Any changes since last visit?  no   Family History  Problem Relation Age of Onset  . Stroke Father   . Stroke Brother    Social History   Social History  . Marital status: Single    Spouse name: N/A  . Number of children: 2  . Years of education: Grad sch[employer]   Social History Main Topics  . Smoking  status: Former Smoker    Quit date: 05/28/2007  . Smokeless tobacco: Never Used  . Alcohol use No  . Drug use: No  . Sexual activity: Not Asked   Other Topics Concern  . None   Social History Narrative   Lives at home by herself.    Daughter come to help at home and she has a cleaning lady to help twice a week.    Caffeine: Every morning 1-1.5 cups coffee/day, and 12oz tea/day    Past Surgical History:  Procedure Laterality Date  . HIP ARTHROPLASTY Right 11/24/2012   Procedure: RIGHT HIP HEMIARTHROPLASTY;  Surgeon: Verlee Rossetti, MD;  Location: [facility];  Service: Orthopedics;  Laterality: Right;  hemiatroplasty, DePuy Triloc   Past Medical History:  Diagnosis Date  . Arthritis   . Diabetes mellitus   . Hypertension   . Stroke (HCC)    BP 129/78   Pulse 84   Resp 16   SpO2 95%   Opioid Risk Score:   Fall Risk Score:  `1  Depression screen PHQ 2/9  Depression screen [facility] 2/9 01/11/2016 11/01/2015 07/13/2015 06/15/2015  Decreased Interest 0 0 1 1  Down, Depressed, Hopeless 0 0 1 1  PHQ - 2 Score 0 0 2 2  Altered sleeping - - - 3  Tired,  decreased energy - - - 2  Change in appetite - - - 0  Feeling bad or failure about yourself  - - - 1  Trouble concentrating - - - 3  Moving slowly or fidgety/restless - - - 2  Suicidal thoughts - - - 0  PHQ-9 Score - - - 13  Difficult doing work/chores - - - Somewhat difficult   Review of Systems  Musculoskeletal: Positive for gait problem.  Neurological: Positive for weakness.  Psychiatric/Behavioral: Positive for dysphoric mood. Memory loss. All other systems reviewed and are negative.     Objective:   Physical Exam HENT: Normocephalic, Atraumatic Eyes: EOMI, Conj WNL Cardio: S1, S2 normal, RRR Pulm: B/l clear to auscultation.  Effort normal Abd: Soft, non-distended, non-tender, BS+ MSK:  Gait improved with AFO, slow cadence.   No TTP.    No edema. Neuro: CN II-XII grossly intact, with mild right facial weakness     Sensation intact to light touch in all LE dermatomes  Reflexes 3+ RLE  Strength  4+/5 in RLE myotomes    5/5 in LLE myotomes MAS: Pt with difficulty relaxing and not resisting, however, ?appears to have increased tone RLE, more pronounced with ambulation. Skin: Warm and Dry    Assessment & Plan:  65 y/o female with pmh of strokes x3 (most recently ~3 years ago), DM, HTN presents for evaluation of spasticity.   1. RLE spasticity s/p stroke  Side effects with Tizanidine, Baclofen  Pt tolerated Dantrole  Encouraged muscle stretches.    Will consider increase in Botox in future  Pt with recent AFO, referral made to PT   Will d/c dantrole for now as pt appears to have benefit with AFO off medication, will revisit in future after Botox if necessary. Labs reviewed from PCP, LFTs WNL. Will tentatively plan for Botox for next visit and reevaluate based on results of therapies. Encouraged presence of daughter.   2. Abnormality of gait  Toe walking with RLE  Cont walker for safety  RTC 1 month

## 2016-01-25 ENCOUNTER — Ambulatory Visit: Payer: Medicare Other | Attending: Physical Medicine & Rehabilitation | Admitting: Rehabilitation

## 2016-01-25 DIAGNOSIS — M6281 Muscle weakness (generalized): Secondary | ICD-10-CM | POA: Diagnosis present

## 2016-01-25 DIAGNOSIS — I69951 Hemiplegia and hemiparesis following unspecified cerebrovascular disease affecting right dominant side: Secondary | ICD-10-CM | POA: Diagnosis present

## 2016-01-25 DIAGNOSIS — R2689 Other abnormalities of gait and mobility: Secondary | ICD-10-CM | POA: Diagnosis present

## 2016-01-25 DIAGNOSIS — R2681 Unsteadiness on feet: Secondary | ICD-10-CM | POA: Diagnosis present

## 2016-01-25 NOTE — Therapy (Signed)
[facility] [address] [place], [place], [address] Phone: [phone]   Fax:  [phone]  Physical Therapy Evaluation  Patient Details  Name: Sophia Rodriguez MRN: [id number] Date of Birth: 1951/02/05 Referring Provider: Dr. Allena KatzPatel  Encounter Date: 01/25/2016      PT End of Session - 01/25/16 1230    Visit Number 1   Number of Visits 9   Date for PT Re-Evaluation 03/25/16   Authorization Type MCR prim, BCBS sec-G code on every 10th visit   PT Start Time 1104   PT Stop Time 1150   PT Time Calculation (min) 46 min   Activity Tolerance Patient tolerated treatment well   Behavior During Therapy [facility] for tasks assessed/performed      Past Medical History:  Diagnosis Date  . Arthritis   . Diabetes mellitus   . Hypertension   . Stroke [facility](HCC)     Past Surgical History:  Procedure Laterality Date  . HIP ARTHROPLASTY Right 11/24/2012   Procedure: RIGHT HIP HEMIARTHROPLASTY;  Surgeon: Verlee RossettiSteven R Norris, MD;  Location: [facility];  Service: Orthopedics;  Laterality: Right;  hemiatroplasty, DePuy Triloc    There were no vitals filed for this visit.       Subjective Assessment - 01/25/16 1109    Subjective Pt reports that she got new AFO that she would like to gait train with it in hopes of improving walking and safety.    Limitations House hold activities;Walking   Patient Stated Goals "To walk with this AFO"   Currently in Pain? No/denies            [facility] PT Assessment - 01/25/16 0001      Assessment   Medical Diagnosis CVA   Referring Provider Dr. Allena KatzPatel   Onset Date/Surgical Date --  foot drop since last CVA in 2014   Hand Dominance Right   Prior Therapy following CVA, broke her hip and had SNF stay and HHPT     Precautions   Precautions Fall   Required Braces or Orthoses Other Brace/Splint  has R AFO     Balance Screen   Has the patient fallen in the past 6 months Yes   How many times? 1   Has the  patient had a decrease in activity level because of a fear of falling?  No   Is the patient reluctant to leave their home because of a fear of falling?  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   Leisure Used to sew, and liked to [place]     Cognition   Overall Cognitive Status History of cognitive impairments - at baseline     Sensation   Light Touch Impaired Detail   Light Touch Impaired Details Impaired RUE;Impaired RLE   Hot/Cold Appears Intact   Proprioception Appears Intact     Coordination   Gross Motor Movements are Fluid and Coordinated Yes  some decreased excursion due to weakness   Fine Motor Movements  are Fluid and Coordinated Yes     ROM / Strength   AROM / PROM / Strength Strength     Strength   Overall Strength Deficits   Overall Strength Comments R hip flex 3+/5, R knee ext 4/5, R knee flex 3/5, R ankle DF 2+/5, R ankle PF 4/5, LLE WFL     Transfers   Transfers Sit to Stand;Stand to Sit   Sit to Stand 6: Modified independent (Device/Increase time)   Five time sit to stand comments  25.90 secs with BUE support   Stand to Sit 6: Modified independent (Device/Increase time)     Ambulation/Gait   Ambulation/Gait Yes   Ambulation/Gait Assistance 5: Supervision   Ambulation/Gait Assistance Details Pt ambultes with short step on RLE, however can improve with cues from therapist.  Tends to take short steps on LLE to avoid WB on RLE.     Ambulation Distance (Feet) 115 Feet   Assistive device  Rolling walker  R AFO   Gait Pattern Step-to pattern;Decreased stride length;Decreased stance time - right;Decreased step length - left;Decreased dorsiflexion - right;Right flexed knee in stance;Trunk rotated posteriorly on right;Poor foot clearance - right   Ambulation Surface Level;Indoor   Gait velocity 1.19 ft/sec   Stairs Yes   Stairs Assistance 5: Supervision   Stairs Assistance Details (indicate cue type and reason) Cues for sequencing   Stair Management Technique Two rails;Step to pattern;Forwards   Number of Stairs 4   Height of Stairs 6     Standardized Balance Assessment   Standardized Balance Assessment Berg Balance Test     Berg Balance Test   Sit to Stand Able to stand  independently using hands   Standing Unsupported Able to stand 2 minutes with supervision   Sitting with Back Unsupported but Feet Supported on Floor or Stool Able to sit safely and securely 2 minutes   Stand to Sit Controls descent by using hands   Transfers Able to transfer with verbal cueing and /or supervision   Standing Unsupported with Eyes Closed Able to stand 10 seconds with supervision   Standing Ubsupported with Feet Together Needs help to attain position but able to stand for 30 seconds with feet together   From Standing, Reach Forward with Outstretched Arm Reaches forward but needs supervision   From Standing Position, Pick up Object from Floor Able to pick up shoe, needs supervision   From Standing Position, Turn to Look Behind Over each Shoulder Looks behind one side only/other side shows less weight shift   Turn 360 Degrees Needs assistance while turning   Standing Unsupported, Alternately Place Feet on Step/Stool Needs assistance to keep from falling or unable to try   Standing Unsupported, One Foot in Front Needs help to step but can hold 15 seconds   Standing on One Leg Tries to lift leg/unable to hold 3 seconds but remains standing independently   Total Score 28   Berg comment: < 36 high  risk for falls (close to 100%)                           PT Education - 01/25/16 1229    Education provided Yes   Education Details Education on evaluation findings, POC, goals, education on improved shoe wear so that AFO can allow for optimal foot clearance.     Person(s) Educated Patient   Methods Explanation   Comprehension Verbalized understanding  PT Short Term Goals - 01/25/16 1243      PT SHORT TERM GOAL #1   Title =LTGs           PT Long Term Goals - 01/25/16 1243      PT LONG TERM GOAL #1   Title Pt will be independent with HEP in order to indicate improved functional mobility and decreased balance.  (Target Date: 02/22/16)   Time 4   Period Weeks   Status New     PT LONG TERM GOAL #2   Title Pt will improve BERG balance score to 32/56 in order to indicate decreased fall risk.     Time 4   Period Weeks   Status New     PT LONG TERM GOAL #3   Title Pt will improve gait speed to >/= 1.8 ft/sec in order to indicate decreased fall risk and improved efficiency of gait.    Time 4   Period Weeks   Status New     PT LONG TERM GOAL #4   Title Pt will ambulate x 200' over indoor sufaces with R AFO and LRAD at mod I level in order to indicate safe home negotiation.     Time 4   Period Weeks   Status New     PT LONG TERM GOAL #5   Title Pt will ambulate 500' over unlevel paved surfaces (including ramp/curb) w/ R AFO and LRAD at S level in order to indicate safe community negotiation.    Time 4   Period Weeks   Status New               Plan - 01/25/16 1237    Clinical Impression Statement Pt presents s/p multiple CVA's (last one in 2014) with residual R hemiparesis and R foot drop.  She has seen Dr. Allena Katz and ordered AFO from [facility] and now presents to PT for gait training with AFO.  Upon PT evaluation, note that she tends to continue to drag RLE, however with cues is able to correct.  Also note increased movement of foot and  brace inside of shoe and therefore educated on more appropriate shoe wear (tennis shoe), BERG score is 28/56 indicative of fall risk and gait speed of 1.19 ft/sec indicative of fall risk.  Pt is of evolving presentation and moderate complexity from PT POC standpoint.  Pt will benefit from skilled OP neuro PT in order to address deficits.     Rehab Potential Good   Clinical Impairments Affecting Rehab Potential time out from CVA, cognitive deficits   PT Frequency 2x / week   PT Duration 4 weeks   PT Treatment/Interventions ADLs/Self Care Home Management;Electrical Stimulation;DME Instruction;Gait training;Stair training;Functional mobility training;Therapeutic activities;Therapeutic exercise;Balance training;Neuromuscular re-education;Patient/family education;Orthotic Fit/Training;Energy conservation;Vestibular   PT Next Visit Plan If pt presents with tennis shoes-gait train with AFO in better shoes, RLE NMR, initiate HEP, equal WB when sitting standing, gait with mirror to improve feedback/carryover with gait deviations.    Consulted and Agree with Plan of Care Patient      Patient will benefit from skilled therapeutic intervention in order to improve the following deficits and impairments:  Abnormal gait, Decreased activity tolerance, Decreased balance, Decreased endurance, Decreased knowledge of use of DME, Decreased mobility, Decreased safety awareness, Decreased strength, Impaired perceived functional ability, Impaired flexibility, Improper body mechanics, Postural dysfunction, Impaired sensation  Visit Diagnosis: Hemiplegia and hemiparesis following unspecified cerebrovascular disease affecting right dominant side (HCC) - Plan: PT plan of care cert/re-cert  Unsteadiness on feet - Plan: PT plan of care cert/re-cert  Other abnormalities of gait and mobility - Plan: PT plan of care cert/re-cert  Muscle weakness (generalized) - Plan: PT plan of care cert/re-cert      G-Codes - 19-Feb-2016 1248     Functional Assessment Tool Used BERG: 28/56   Functional Limitation Mobility: Walking and moving around   Mobility: Walking and Moving Around Current Status ([id number]) At least 40 percent but less than 60 percent impaired, limited or restricted   Mobility: Walking and Moving Around Goal Status [phone]) At least 40 percent but less than 60 percent impaired, limited or restricted  BERG score remains in same functional level       Problem List Patient Active Problem List   Diagnosis Date Noted  . Muscle spasticity 10/04/2015  . 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 Right hemiparesis (HCC) 05/10/2014  . Abnormality of gait 05/10/2014  . Closed fracture of right hip (HCC) 11/24/2012  . 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 Stroke (HCC)   . Hypertension     Harriet Butte, PT, MPT [facility] [address] [place], [place], [address] Phone: [phone]   Fax:  [phone] 19-Feb-2016, 12:50 PM  Name: Julianne Chamberlin MRN: [id number] Date of Birth: January 31, 1951

## 2016-01-28 ENCOUNTER — Ambulatory Visit: Payer: Medicare Other | Admitting: Physical Therapy

## 2016-01-28 ENCOUNTER — Encounter: Payer: Self-pay | Admitting: Physical Therapy

## 2016-01-28 DIAGNOSIS — M6281 Muscle weakness (generalized): Secondary | ICD-10-CM

## 2016-01-28 DIAGNOSIS — I69951 Hemiplegia and hemiparesis following unspecified cerebrovascular disease affecting right dominant side: Secondary | ICD-10-CM

## 2016-01-28 DIAGNOSIS — R2689 Other abnormalities of gait and mobility: Secondary | ICD-10-CM

## 2016-01-28 DIAGNOSIS — R2681 Unsteadiness on feet: Secondary | ICD-10-CM

## 2016-01-28 NOTE — Therapy (Signed)
[facility] [address] [place], [place], [address] Phone: [phone]   Fax:  [phone]  Physical Therapy Treatment  Patient Details  Name: Sophia Rodriguez MRN: [id number] Date of Birth: 10-20-1950 Referring Provider: Dr. Allena Katz  Encounter Date: 01/28/2016      PT End of Session - 01/28/16 0808    Visit Number 2   Number of Visits 9   Date for PT Re-Evaluation 03/25/16   Authorization Type MCR prim, BCBS sec-G code on every 10th visit   PT Start Time 1102   PT Stop Time 1145   PT Time Calculation (min) 43 min   Equipment Utilized During Treatment Gait belt   Activity Tolerance Patient tolerated treatment well   Behavior During Therapy WFL for tasks assessed/performed      Past Medical History:  Diagnosis Date  . Arthritis   . Diabetes mellitus   . Hypertension   . Stroke [facility])     Past Surgical History:  Procedure Laterality Date  . HIP ARTHROPLASTY Right 11/24/2012   Procedure: RIGHT HIP HEMIARTHROPLASTY;  Surgeon: Verlee Rossetti, MD;  Location: [facility];  Service: Orthopedics;  Laterality: Right;  hemiatroplasty, DePuy Triloc    There were no vitals filed for this visit.      Subjective Assessment - 01/28/16 0807    Subjective No new complaints. No falls or pain to report. Has new shoes, wearing them to therapy today.   Limitations House hold activities;Walking   Patient Stated Goals "To walk with this AFO"   Currently in Pain? No/denies            [facility] Adult PT Treatment/Exercise - 01/28/16 0821      Transfers   Transfers Sit to Stand;Stand to Sit   Sit to Stand 6: Modified independent (Device/Increase time);With upper extremity assist;From bed   Stand to Sit 6: Modified independent (Device/Increase time);With upper extremity assist;To bed     Ambulation/Gait   Ambulation/Gait Yes   Ambulation/Gait Assistance 5: Supervision;4: Min assist;4: Min guard   Ambulation/Gait Assistance Details  multimodal cues needed for: increased right step length, increased right hip/knee flexion with swing phase. manual facilitation for weight shifting and for posture. used blue taps on walker as visual points for pt to "step your foot under the tabs each step" with improved step length noted for short durations before cues needed again.                         Ambulation Distance (Feet) 115 Feet  x1; 230 x1   Assistive device Rolling walker  right AFO   Gait Pattern Step-to pattern;Step-through pattern;Decreased stride length;Decreased step length - right;Decreased step length - left;Decreased stance time - right;Decreased hip/knee flexion - right;Decreased weight shift to right;Decreased dorsiflexion - right;Right steppage;Decreased trunk rotation;Narrow base of support;Poor foot clearance - right   Ambulation Surface Level;Indoor     Issued the following to HEP: Functional Quadriceps: Sit to Stand    Sit on edge of chair, feet flat on floor. Stand upright, extending knees fully. Use arms as needed. Repeat _10_ times per set. Do _1 sets per session. Do _1-2_ sessions per day.  http://orth.exer.us/735   Copyright  VHI. All rights reserved.  Chair Sitting    Sit at edge of seat, spine straight, right leg extended. Put a hand on each thigh and bend forward from the hip, keeping spine straight. Allow hand on right leg to reach toward toes. Support upper  body with other arm. Hold __60_ seconds. Repeat __3_ times per session. Do _1-2__ sessions per day.  Copyright  VHI. All rights reserved.  Bridge    Lie back, legs bent. Lift hips up off surface. Hold for 5 seconds. Repeat _10_ times. Do __1-2_ sessions per day.  http://pm.exer.us/55   Copyright  VHI. All rights reserved.   Straight Leg: with Bent Knee (Supine)    With right leg straight, other leg bent, raise straight leg __2-3__ inches. Hold in air for 3-5 seconds, then slowly lower back down to mat/bed. Repeat _10___ times  per set. Do _1_ sets per session. Do _1-2_ sessions per day.  http://orth.exer.us/686   Copyright  VHI. All rights reserved.    Hip Flexor Stretch    Lying on back right side near edge of bed, bend one leg, foot flat. Hang right leg over edge, relaxed, thigh resting entirely on bed for __1__ minutes. Repeat __3__ times. Do __1-2__ sessions per day. Advanced Exercise: Bend knee back keeping thigh in contact with bed.  http://[email or web address].exer.us/347   Copyright  VHI. All rights reserved.             PT Education - 01/28/16 0843    Education provided Yes   Education Details HEP for right LE strengthening and stretching;gait with new AFO   Person(s) Educated Patient   Methods Explanation;Demonstration;Verbal cues;Handout;Tactile cues   Comprehension Verbalized understanding;Returned demonstration;Verbal cues required;Tactile cues required;Need further instruction          PT Short Term Goals - 01/25/16 1243      PT SHORT TERM GOAL #1   Title =LTGs           PT Long Term Goals - 01/25/16 1243      PT LONG TERM GOAL #1   Title Pt will be independent with HEP in order to indicate improved functional mobility and decreased balance.  (Target Date: 02/22/16)   Time 4   Period Weeks   Status New     PT LONG TERM GOAL #2   Title Pt will improve BERG balance score to 32/56 in order to indicate decreased fall risk.     Time 4   Period Weeks   Status New     PT LONG TERM GOAL #3   Title Pt will improve gait speed to >/= 1.8 ft/sec in order to indicate decreased fall risk and improved efficiency of gait.    Time 4   Period Weeks   Status New     PT LONG TERM GOAL #4   Title Pt will ambulate x 200' over indoor sufaces with R AFO and LRAD at mod I level in order to indicate safe home negotiation.     Time 4   Period Weeks   Status New     PT LONG TERM GOAL #5   Title Pt will ambulate 500' over unlevel paved surfaces (including ramp/curb) w/ R AFO and LRAD at S level  in order to indicate safe community negotiation.    Time 4   Period Weeks   Status New           Plan - 01/28/16 0809    Clinical Impression Statement Today's skilled session focused on gait training with new AFO and on establishmetn of HEP for right LE strengthening and stretching. Pt needs max cues with gait for increased step length and increased hip/knee flexion with swing phase with poor carryover without cues. Remainder of session focused on home program without any  issues reported in session with performance. Pt is making progress toward goals and should benefit from continued PT to progress toward unmet goals.                              Rehab Potential Good   Clinical Impairments Affecting Rehab Potential time out from CVA, cognitive deficits   PT Frequency 2x / week   PT Duration 4 weeks   PT Treatment/Interventions ADLs/Self Care Home Management;Electrical Stimulation;DME Instruction;Gait training;Stair training;Functional mobility training;Therapeutic activities;Therapeutic exercise;Balance training;Neuromuscular re-education;Patient/family education;Orthotic Fit/Training;Energy conservation;Vestibular   PT Next Visit Plan continued gait training wiith AFO in better shoes, RLE NMR, equal WB when sitting standing, gait with mirror to improve feedback/carryover with gait deviations.    Consulted and Agree with Plan of Care Patient      Patient will benefit from skilled therapeutic intervention in order to improve the following deficits and impairments:  Abnormal gait, Decreased activity tolerance, Decreased balance, Decreased endurance, Decreased knowledge of use of DME, Decreased mobility, Decreased safety awareness, Decreased strength, Impaired perceived functional ability, Impaired flexibility, Improper body mechanics, Postural dysfunction, Impaired sensation  Visit Diagnosis: Hemiplegia and hemiparesis following unspecified cerebrovascular disease affecting right dominant side  (HCC)  Unsteadiness on feet  Other abnormalities of gait and mobility  Muscle weakness (generalized)     Problem List Patient Active Problem List   Diagnosis Date Noted  . Muscle spasticity 10/04/2015  . 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 Right hemiparesis (HCC) 05/10/2014  . Abnormality of gait 05/10/2014  . Closed fracture of right hip (HCC) 11/24/2012  . 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 Stroke (HCC)   . Hypertension     Sallyanne KusterKathy Bury, [place]PTA, [facility] [address], [address] [place], [place] [address] [phone] 01/28/16, 12:55 PM   Name: Sophia Rodriguez MRN: [id number] Date of Birth: 1950-11-19

## 2016-01-28 NOTE — Patient Instructions (Addendum)
Functional Quadriceps: Sit to Stand    Sit on edge of chair, feet flat on floor. Stand upright, extending knees fully. Use arms as needed. Repeat _10_ times per set. Do _1 sets per session. Do _1-2_ sessions per day.  http://orth.exer.us/735   Copyright  VHI. All rights reserved.  Chair Sitting    Sit at edge of seat, spine straight, right leg extended. Put a hand on each thigh and bend forward from the hip, keeping spine straight. Allow hand on right leg to reach toward toes. Support upper body with other arm. Hold __60_ seconds. Repeat __3_ times per session. Do _1-2__ sessions per day.  Copyright  VHI. All rights reserved.  Bridge    Lie back, legs bent. Lift hips up off surface. Hold for 5 seconds. Repeat _10_ times. Do __1-2_ sessions per day.  http://pm.exer.us/55   Copyright  VHI. All rights reserved.   Straight Leg: with Bent Knee (Supine)    With right leg straight, other leg bent, raise straight leg __2-3__ inches. Hold in air for 3-5 seconds, then slowly lower back down to mat/bed. Repeat _10___ times per set. Do _1_ sets per session. Do _1-2_ sessions per day.  http://orth.exer.us/686   Copyright  VHI. All rights reserved.    Hip Flexor Stretch    Lying on back right side near edge of bed, bend one leg, foot flat. Hang right leg over edge, relaxed, thigh resting entirely on bed for __1__ minutes. Repeat __3__ times. Do __1-2__ sessions per day. Advanced Exercise: Bend knee back keeping thigh in contact with bed.  http://[email or web address].exer.us/347   Copyright  VHI. All rights reserved.

## 2016-01-31 ENCOUNTER — Encounter: Payer: Self-pay | Admitting: Physical Therapy

## 2016-01-31 ENCOUNTER — Ambulatory Visit: Payer: Medicare Other | Admitting: Physical Therapy

## 2016-01-31 DIAGNOSIS — M6281 Muscle weakness (generalized): Secondary | ICD-10-CM

## 2016-01-31 DIAGNOSIS — I69951 Hemiplegia and hemiparesis following unspecified cerebrovascular disease affecting right dominant side: Secondary | ICD-10-CM

## 2016-01-31 DIAGNOSIS — R2681 Unsteadiness on feet: Secondary | ICD-10-CM

## 2016-01-31 DIAGNOSIS — R2689 Other abnormalities of gait and mobility: Secondary | ICD-10-CM

## 2016-01-31 NOTE — Patient Instructions (Signed)
**   Updated due report of pt pain during stretch.  Advised patient to place stool or box under R leg when hanging off edge of bed to reduce intensity of stretch.  Hip Flexor Stretch    Lying on back right side near edge of bed, bend one leg, foot flat. Hang right leg over edge, relaxed, thigh resting entirely on bed for __1__ minutes. Repeat __3__ times. Do __1-2__ sessions per day. Advanced Exercise: Bend knee back keeping thigh in contact with bed.  http://[email or web address].exer.us/347   Copyright  VHI. All rights reserved.

## 2016-01-31 NOTE — Therapy (Signed)
[facility] [address] [place], [place], [address] Phone: [phone]   Fax:  [phone]  Physical Therapy Treatment  Patient Details  Name: Sophia Rodriguez MRN: [id number] Date of Birth: 02-14-1951 Referring Provider: Dr. Allena KatzPatel  Encounter Date: 01/31/2016      PT End of Session - 01/31/16 1412    Visit Number 3   Number of Visits 9   Date for PT Re-Evaluation 03/25/16   Authorization Type MCR prim, BCBS sec-G code on every 10th visit   PT Start Time 1015   PT Stop Time 1100   PT Time Calculation (min) 45 min   Equipment Utilized During Treatment Gait belt   Activity Tolerance Patient tolerated treatment well   Behavior During Therapy [facility]FL for tasks assessed/performed      Past Medical History:  Diagnosis Date  . Arthritis   . Diabetes mellitus   . Hypertension   . Stroke [facility](HCC)     Past Surgical History:  Procedure Laterality Date  . HIP ARTHROPLASTY Right 11/24/2012   Procedure: RIGHT HIP HEMIARTHROPLASTY;  Surgeon: Verlee RossettiSteven R Norris, MD;  Location: [facility];  Service: Orthopedics;  Laterality: Right;  hemiatroplasty, DePuy Triloc    There were no vitals filed for this visit.      Subjective Assessment - 01/31/16 1019    Subjective Patient reports she is performing her HEP regulary, but states the quad stretch is causing pain.   Patient is accompained by: Family member   Limitations House hold activities;Walking   Patient Stated Goals "To walk with this AFO"   Currently in Pain? No/denies           [facility] Treatment/Exercise - 01/31/16 0001      Transfers   Transfers Sit to Stand;Stand to Sit   Sit to Stand 6: Modified independent (Device/Increase time);With upper extremity assist;From bed   Stand to Sit 6: Modified independent (Device/Increase time);With upper extremity assist;To bed     Ambulation/Gait   Ambulation/Gait Yes   Ambulation/Gait Assistance 4: Min guard   Ambulation/Gait  Assistance Details Verbal, visual, and tactile cues to increase step length and even weight bearing.   Ambulation Distance (Feet) 155 Feet  x1; 7940ft x1   Assistive device Rolling walker  R AFO   Gait Pattern Step-to pattern;Step-through pattern;Decreased stride length;Decreased step length - right;Decreased step length - left;Decreased stance time - right;Decreased hip/knee flexion - right;Decreased weight shift to right;Decreased dorsiflexion - right;Right steppage;Decreased trunk rotation;Narrow base of support;Poor foot clearance - right   Ambulation Surface Level;Indoor   Gait Comments Patient demonstrated improvement in step length, hip and knee flexion, and weight shifting, when provided with visual feedback. She demonstrated carryover of improved gait pattern when visual feedback was removed, however VC were periodically required to maintain correct technique.     Neuro Re-ed    Neuro Re-ed Details  Performed sit<>stand from high/low table; 4" step placed under L LE to facilitate use of the R LE and weight shifting to the right; Verbal, tactial, and visual cues to correct L lateral lean during transfer; 7x; Ineffective at encouraging pt to shift weight and caused pain in the L quad. Activity aborted.           Balance Exercises - 01/31/16 1352      Balance Exercises: Standing   Other Standing Exercises Toe taps with L LE to black foam beam; to encourage weight shift to the right; hand held assist and manual facilitation for stability and  weight shift; 10 reps x 3; seated break between each set;           PT Short Term Goals - 01/25/16 1243      PT SHORT TERM GOAL #1   Title =LTGs           PT Long Term Goals - 01/25/16 1243      PT LONG TERM GOAL #1   Title Pt will be independent with HEP in order to indicate improved functional mobility and decreased balance.  (Target Date: 02/22/16)   Time 4   Period Weeks   Status New     PT LONG TERM GOAL #2   Title Pt will  improve BERG balance score to 32/56 in order to indicate decreased fall risk.     Time 4   Period Weeks   Status New     PT LONG TERM GOAL #3   Title Pt will improve gait speed to >/= 1.8 ft/sec in order to indicate decreased fall risk and improved efficiency of gait.    Time 4   Period Weeks   Status New     PT LONG TERM GOAL #4   Title Pt will ambulate x 200' over indoor sufaces with R AFO and LRAD at mod I level in order to indicate safe home negotiation.     Time 4   Period Weeks   Status New     PT LONG TERM GOAL #5   Title Pt will ambulate 500' over unlevel paved surfaces (including ramp/curb) w/ R AFO and LRAD at S level in order to indicate safe community negotiation.    Time 4   Period Weeks   Status New           Plan - 01/31/16 1414    Clinical Impression Statement Todays skilled session focused on even weight distribution during gait with AFO. Patient expressed fear at weight bearing on her R LE. HEP was reviewed and updated to prevent pain during quad stretch. She is making progress towards goals and should benefit from continued PT to achieve unmet goals.   Rehab Potential Good   Clinical Impairments Affecting Rehab Potential time out from CVA, cognitive deficits   PT Frequency 2x / week   PT Duration 4 weeks   PT Treatment/Interventions ADLs/Self Care Home Management;Electrical Stimulation;DME Instruction;Gait training;Stair training;Functional mobility training;Therapeutic activities;Therapeutic exercise;Balance training;Neuromuscular re-education;Patient/family education;Orthotic Fit/Training;Energy conservation;Vestibular   PT Next Visit Plan continued gait training wiith AFO, RLE NMR, equal WB when sitting standing, gait with mirror to improve feedback/carryover with gait deviations.    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 Muscle spasticity 10/04/2015  . 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 Right hemiparesis (HCC) 05/10/2014  . Abnormality of gait 05/10/2014  . Closed fracture of right hip (HCC) 11/24/2012  . 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 Stroke (HCC)   . Hypertension     Kallie Locks, SPTA 01/31/2016, 2:23 PM  [facility] [address] [place], [place], [address] Phone: [phone]   Fax:  [phone]  Name: Sophia Rodriguez MRN: [id number] Date of Birth: Mar 07, 1951

## 2016-02-04 ENCOUNTER — Ambulatory Visit: Payer: Medicare Other | Admitting: *Deleted

## 2016-02-04 DIAGNOSIS — R2689 Other abnormalities of gait and mobility: Secondary | ICD-10-CM

## 2016-02-04 DIAGNOSIS — I69951 Hemiplegia and hemiparesis following unspecified cerebrovascular disease affecting right dominant side: Secondary | ICD-10-CM | POA: Diagnosis not present

## 2016-02-04 DIAGNOSIS — R2681 Unsteadiness on feet: Secondary | ICD-10-CM

## 2016-02-04 DIAGNOSIS — M6281 Muscle weakness (generalized): Secondary | ICD-10-CM

## 2016-02-05 NOTE — Therapy (Signed)
[facility] [address] [place], [place], [address] Phone: [phone]   Fax:  [phone]  Physical Therapy Treatment  Patient Details  Name: Sophia Rodriguez MRN: [id number] Date of Birth: Mar 05, 1951 Referring Provider: Dr. Allena Katz  Encounter Date: 02/04/2016      PT End of Session - 02/05/16 0821    Visit Number 4   Number of Visits 9   PT Start Time 0810   PT Stop Time 0848   PT Time Calculation (min) 38 min   Activity Tolerance Patient tolerated treatment well   Behavior During Therapy [facility] for tasks assessed/performed      Past Medical History:  Diagnosis Date  . Arthritis   . Diabetes mellitus   . Hypertension   . Stroke [facility])     Past Surgical History:  Procedure Laterality Date  . HIP ARTHROPLASTY Right 11/24/2012   Procedure: RIGHT HIP HEMIARTHROPLASTY;  Surgeon: Verlee Rossetti, MD;  Location: [facility];  Service: Orthopedics;  Laterality: Right;  hemiatroplasty, DePuy Triloc    There were no vitals filed for this visit.      Subjective Assessment - 02/04/16 0812    Subjective Reports stretch that was causing pain is better now.  No falls since last visit   Limitations House hold activities;Walking   Patient Stated Goals "To walk with this AFO"   Currently in Pain? No/denies                         [facility] Adult PT Treatment/Exercise - 02/05/16 0001      Transfers   Transfers (P)  Sit to Stand   Sit to Stand (P)  6: Modified independent (Device/Increase time)   Stand to Sit (P)  6: Modified independent (Device/Increase time)   Comments (P)  Practiced 8x with feet even for increased weight shift R and one UE support.     Ambulation/Gait   Ambulation/Gait (P)  Yes   Ambulation/Gait Assistance (P)  5: Supervision   Ambulation Distance (Feet) (P)  220 Feet   Assistive device (P)  Rolling walker   Gait Pattern (P)  Step-to pattern;Step-through pattern;Right circumduction;Poor foot  clearance - right;Trunk rotated posteriorly on right;Decreased trunk rotation;Right steppage   Stairs (P)  Yes   Stairs Assistance (P)  5: Supervision   Stair Management Technique (P)  Two rails;Alternating pattern;Step to pattern;Forwards   Number of Stairs (P)  4   Height of Stairs (P)  6     Exercises   Exercises (P)  Knee/Hip;Ankle     Knee/Hip Exercises: Stretches   Hip Flexor Stretch (P)  2 reps;60 seconds;Right   Hip Flexor Stretch Limitations (P)  to review HEP     Knee/Hip Exercises: Standing   Hip Abduction (P)  Stengthening;Both;10 reps   Abduction Limitations (P)  in hooklying with red t-band     Knee/Hip Exercises: Seated   Sit to Sand (P)  Other (comment)  8 reps with one UE support     Knee/Hip Exercises: Supine   Bridges (P)  Right;Strengthening;10 reps   Straight Leg Raises (P)  10 reps;Both;Strengthening     Ankle Exercises: Supine   T-Band (P)  for PF seated x 10 and DF supine with assist with red band x 10 each                PT Education - 02/05/16 6962    Education provided Yes   Education Details Safety/reason for AFO  Person(s) Educated Patient   Methods Explanation;Demonstration   Comprehension Verbalized understanding;Need further instruction          PT Short Term Goals - 01/25/16 1243      PT SHORT TERM GOAL #1   Title =LTGs           PT Long Term Goals - 01/25/16 1243      PT LONG TERM GOAL #1   Title Pt will be independent with HEP in order to indicate improved functional mobility and decreased balance.  (Target Date: 02/22/16)   Time 4   Period Weeks   Status New     PT LONG TERM GOAL #2   Title Pt will improve BERG balance score to 32/56 in order to indicate decreased fall risk.     Time 4   Period Weeks   Status New     PT LONG TERM GOAL #3   Title Pt will improve gait speed to >/= 1.8 ft/sec in order to indicate decreased fall risk and improved efficiency of gait.    Time 4   Period Weeks   Status New      PT LONG TERM GOAL #4   Title Pt will ambulate x 200' over indoor sufaces with R AFO and LRAD at mod I level in order to indicate safe home negotiation.     Time 4   Period Weeks   Status New     PT LONG TERM GOAL #5   Title Pt will ambulate 500' over unlevel paved surfaces (including ramp/curb) w/ R AFO and LRAD at S level in order to indicate safe community negotiation.    Time 4   Period Weeks   Status New               Plan - 02/05/16 [id number]    Clinical Impression Statement Patient progressing with gait with AFO, but still not pleased with how it feels.  Needs further training with AFO on level and uneven surfaces as well as with curbs and stairs.  Also needs specific balance training to progress safety and for decreased restrictive device.  Will continue to benefit from skilled PT to progress to goals.    Rehab Potential Good   PT Frequency 2x / week   PT Duration 4 weeks   PT Treatment/Interventions ADLs/Self Care Home Management;Electrical Stimulation;DME Instruction;Gait training;Stair training;Functional mobility training;Therapeutic activities;Therapeutic exercise;Balance training;Neuromuscular re-education;Patient/family education;Orthotic Fit/Training;Energy conservation;Vestibular   PT Next Visit Plan Continue orthotic training, R LE NMR, initiate balance HEP   Consulted and Agree with Plan of Care Patient      Patient will benefit from skilled therapeutic intervention in order to improve the following deficits and impairments:  Abnormal gait, Decreased activity tolerance, Decreased balance, Decreased endurance, Decreased knowledge of use of DME, Decreased mobility, Decreased safety awareness, Decreased strength, Impaired perceived functional ability, Impaired flexibility, Improper body mechanics, Postural dysfunction, Impaired sensation  Visit Diagnosis: Hemiplegia and hemiparesis following unspecified cerebrovascular disease affecting right dominant side  (HCC)  Unsteadiness on feet  Other abnormalities of gait and mobility  Muscle weakness (generalized)     Problem List Patient Active Problem List   Diagnosis Date Noted  . Muscle spasticity 10/04/2015  . 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 Right hemiparesis (HCC) 05/10/2014  . Abnormality of gait 05/10/2014  . Closed fracture of right hip (HCC) 11/24/2012  . 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  Stroke (HCC)   . Hypertension     Elray McgregorCynthia Aveline Daus 02/05/2016, 8:33 AM  Sheran Lawlessyndi Leshay Desaulniers, PT [phone] 02/05/2016   [facility] [address] [place], [place], [address] Phone: [phone]   Fax:  [phone]  Name: Sophia Rodriguez MRN: [id number] Date of Birth: July 08, 1950

## 2016-02-05 NOTE — Patient Instructions (Signed)
Educated pt on importance of AFO due to R ankle risk of inversion ankle sprain.  Also discussed footwear in the home for safety.  Patient encouraged to walk some without brace but with shoes on when able to focus and concentrate.

## 2016-02-06 ENCOUNTER — Encounter: Payer: Medicare Other | Attending: Physical Medicine & Rehabilitation | Admitting: Physical Medicine & Rehabilitation

## 2016-02-06 ENCOUNTER — Encounter: Payer: Self-pay | Admitting: Physical Medicine & Rehabilitation

## 2016-02-06 VITALS — BP 120/82 | HR 85 | Resp 14

## 2016-02-06 DIAGNOSIS — E119 Type 2 diabetes mellitus without complications: Secondary | ICD-10-CM | POA: Diagnosis not present

## 2016-02-06 DIAGNOSIS — I69898 Other sequelae of other cerebrovascular disease: Secondary | ICD-10-CM | POA: Diagnosis present

## 2016-02-06 DIAGNOSIS — I10 Essential (primary) hypertension: Secondary | ICD-10-CM | POA: Insufficient documentation

## 2016-02-06 DIAGNOSIS — G8310 Monoplegia of lower limb affecting unspecified side: Secondary | ICD-10-CM | POA: Diagnosis not present

## 2016-02-06 DIAGNOSIS — Z8673 Personal history of transient ischemic attack (TIA), and cerebral infarction without residual deficits: Secondary | ICD-10-CM | POA: Diagnosis not present

## 2016-02-06 DIAGNOSIS — R269 Unspecified abnormalities of gait and mobility: Secondary | ICD-10-CM

## 2016-02-06 NOTE — Progress Notes (Signed)
Subjective:    Patient ID: Sophia Rodriguez, female    DOB: 1950-07-23, 65 y.o.   MRN: [id number]  HPI  65 y/o female with pmh of strokes x3 (most recently ~2014), DM, HTN presents for follow up of spasticity.  The problem is mainly in her right leg.  It causes her difficulty with ambulation.  She uses a rolling walker currently and has not had any falls.  The problems started ~ 5 years ago getting worse over times. The biggest problems seems to be dragging of her foot.  It gets worse at the end of the day.  She has some associated weakness, but denies numbness/tingling.   Previously she was prescribed Baclofen 5, which did not work and 10, which caused her too much drowsiness.  She had Botox, but this was not effective, tizanidine made her too drowsy.   She was last seen in clinic on 01/11/16. Pt is poor historian. She is wearing her brace. Pt's daughter present today.  Pt daughter and therapies have noticed improved in ambulation, but pt is not aware of any difference.  Overall, she states she is doing well.  Her daughter is in agreement.   Pain Inventory Average Pain 3 Pain Right Now 0 My pain is tight  In the last 24 hours, has pain interfered with the following? General activity 6 Relation with others 3 Enjoyment of life 2 What TIME of day is your pain at its worst? evening Sleep (in general) Fair  Pain is worse with: walking and some activites Pain improves with: rest, therapy/exercise and medication Relief from Meds: 6  Mobility walk with assistance use a walker ability to climb steps?  no do you drive?  no use a wheelchair  Function I need assistance with the following:  meal prep, household duties and shopping Do you have any goals in this area?  no  Neuro/Psych trouble walking confusion depression anxiety  Prior Studies Any changes since last visit?  no  Physicians involved in your care Any changes since last visit?  no   Family History  Problem Relation  Age of Onset  . Stroke Father   . Stroke Brother    Social History   Social History  . Marital status: Single    Spouse name: N/A  . Number of children: 2  . Years of education: Grad schoo   Social History Main Topics  . Smoking status: Former Smoker    Quit date: 05/28/2007  . Smokeless tobacco: Never Used  . Alcohol use No  . Drug use: No  . Sexual activity: Not Asked   Other Topics Concern  . None   Social History Narrative   Lives at home by herself.    Daughter come to help at home and she has a cleaning lady to help twice a week.    Caffeine: Every morning 1-1.5 cups coffee/day, and 12oz tea/day    Past Surgical History:  Procedure Laterality Date  . HIP ARTHROPLASTY Right 11/24/2012   Procedure: RIGHT HIP HEMIARTHROPLASTY;  Surgeon: Verlee RossettiSteven R Norris, MD;  Location: [facility];  Service: Orthopedics;  Laterality: Right;  hemiatroplasty, DePuy Triloc   Past Medical History:  Diagnosis Date  . Arthritis   . Diabetes mellitus   . Hypertension   . Stroke (HCC)    BP 120/82 (BP Location: Right Arm, Patient Position: Sitting, Cuff Size: Large)   Pulse 85   Resp 14   SpO2 97%   Opioid Risk Score:   Fall Risk  Score:  `1  Depression screen PHQ 2/9  Dep[facility] 01/11/2016 11/01/2015 07/13/2015 06/15/2015  Decreased Interest 0 0 1 1  Down, Depressed, Hopeless 0 0 1 1  PHQ - 2 Score 0 0 2 2  Altered sleeping - - - 3  Tired, decreased energy - - - 2  Change in appetite - - - 0  Feeling bad or failure about yourself  - - - 1  Trouble concentrating - - - 3  Moving slowly or fidgety/restless - - - 2  Suicidal thoughts - - - 0  PHQ-9 Score - - - 13  Difficult doing work/chores - - - Somewhat difficult   Review of Systems  Musculoskeletal: Positive for gait problem.  Neurological: Positive for weakness.  Psychiatric/Behavioral: Positive for dysphoric mood. Memory loss. All other systems reviewed and are negative.     Objective:   Physical Exam HENT:  Normocephalic, Atraumatic Eyes: EOMI, Conj WNL Cardio: S1, S2 normal, RRR Pulm: B/l clear to auscultation.  Effort normal Abd: Soft, non-distended, non-tender, BS+ MSK:  Gait improved with AFO, improved cadence.   No TTP.    No edema. Neuro: CN II-XII grossly intact, with mild right facial weakness    Sensation intact to light touch in all LE dermatomes  Reflexes 3+ RLE  Strength  4+/5 in RLE myotomes (unchanged)    5/5 in LLE myotomes MAS: Pt with difficulty relaxing and not resisting, however, ?appears to have increased tone RLE, more pronounced with ambulation. Skin: Warm and Dry    Assessment & Plan:  65 y/o female with pmh of strokes x3 (most recently ~3 years ago), DM, HTN presents for evaluation of spasticity.   1. RLE spasticity s/p stroke  Side effects with Tizanidine, Baclofen  Encouraged muscle stretches.    Will consider increase in Botox in future at increased dose  Pt with AFO  Cont PT   Will hold off on Botox injection for time being and allow for progress in therapies.    Encouraged presence of daughter.   2. Abnormality of gait  Toe walking with RLE, improving  Cont walker for safety

## 2016-02-08 ENCOUNTER — Ambulatory Visit: Payer: Medicare Other | Admitting: Rehabilitation

## 2016-02-08 ENCOUNTER — Encounter: Payer: Self-pay | Admitting: Rehabilitation

## 2016-02-08 DIAGNOSIS — I69951 Hemiplegia and hemiparesis following unspecified cerebrovascular disease affecting right dominant side: Secondary | ICD-10-CM | POA: Diagnosis not present

## 2016-02-08 DIAGNOSIS — M6281 Muscle weakness (generalized): Secondary | ICD-10-CM

## 2016-02-08 DIAGNOSIS — R2681 Unsteadiness on feet: Secondary | ICD-10-CM

## 2016-02-08 DIAGNOSIS — R2689 Other abnormalities of gait and mobility: Secondary | ICD-10-CM

## 2016-02-08 NOTE — Therapy (Signed)
[facility] [address] [place], [place], [address] Phone: [phone]   Fax:  [phone]  Physical Therapy Treatment  Patient Details  Name: Sophia Rodriguez MRN: [id number] Date of Birth: 1950/07/09 Referring Provider: Dr. Allena KatzPatel  Encounter Date: 02/08/2016      PT End of Session - 02/08/16 0809    Visit Number 5   Number of Visits 9   Date for PT Re-Evaluation 03/25/16   Authorization Type MCR prim, BCBS sec-G code on every 10th visit   PT Start Time 0801   PT Stop Time 0845   PT Time Calculation (min) 44 min   Activity Tolerance Patient tolerated treatment well   Behavior During Therapy [facility] for tasks assessed/performed      Past Medical History:  Diagnosis Date  . Arthritis   . Diabetes mellitus   . Hypertension   . Stroke [facility](HCC)     Past Surgical History:  Procedure Laterality Date  . HIP ARTHROPLASTY Right 11/24/2012   Procedure: RIGHT HIP HEMIARTHROPLASTY;  Surgeon: Verlee RossettiSteven R Norris, MD;  Location: [facility];  Service: Orthopedics;  Laterality: Right;  hemiatroplasty, DePuy Triloc    There were no vitals filed for this visit.      Subjective Assessment - 02/08/16 0804    Subjective Reports that R toe is hurting a lot this morning and she is unsure of cause.    Limitations House hold activities;Walking   Patient Stated Goals "To walk with this AFO"   Currently in Pain? Yes   Pain Score 8    Pain Location Toe (Comment which one)  R big toe   Pain Orientation Right   Pain Descriptors / Indicators Aching   Pain Type Acute pain   Pain Onset Today   Pain Frequency Intermittent   Aggravating Factors  when walking    Pain Relieving Factors rest                         [facility] Adult PT Treatment/Exercise - 02/08/16 0830      Ambulation/Gait   Ambulation/Gait Yes   Ambulation/Gait Assistance 5: Supervision;4: Min guard;4: Min assist   Ambulation/Gait Assistance Details Trialed gait  with use of quad tip cane x 115' with min A.  Utilized this device in order to work on improved postural control and improved R forward and lateral weight shift during R stance phase of gait with increased R knee extension during stance as well.  Did very well with min cues for posture and slower larger stride length.  Note difficulty maintaining, esp with fatigue, but is able to correct with cues.  Then performed gait x 115' without AD in order to continue to work on these deficits without any UE support.  Again, requires only min A with good ability to take larger steps.  Had pt put these principles together with use of RW.  Note some improvement, however continues to demonstrate short step on the R and quicker step on the L.     Ambulation Distance (Feet) 115 Feet  x 3 reps   Assistive device Rolling walker;None  quad tip cane   Gait Pattern Step-to pattern;Step-through pattern;Right circumduction;Poor foot clearance - right;Trunk rotated posteriorly on right;Decreased trunk rotation;Right steppage   Ambulation Surface Level;Indoor   Ramp 5: Supervision   Ramp Details (indicate cue type and reason) S for safety with cues for stepping into RW   Curb 5: Supervision   Curb Details (indicate cue  type and reason) cues for sequencing and technique and safety with RW     Self-Care   Self-Care Other Self-Care Comments   Other Self-Care Comments  Assessed pts R great toe prior to any therapy during session.  Note some erythema at lateral aspect of nail bed and with tenderness to palpation indicative of possible ingrown toenail.  Recommend she see foot MD regarding this matter.       Neuro Re-ed    Neuro Re-ed Details  Had pt stand and tap LLE to small 3" step with use of mirror for increased visual feedback on upright posture, increased R lateral weight shift and improved R stance.  Requires assist only for shifting over to the R and pt improved within task due to increasing confidence.  Progressed to  tapping LLE to cone for elevated height and need to control L step and increase time spent on RLE.  Same assist as before.  Ended NMR tasks with cone tapping alternating LEs and side stepping to next target x 10 reps.  light min/guard for support with cues for increased time spent on RLE.                  PT Education - 02/08/16 1110    Education provided Yes   Education Details education on seeing foot MD regarding R toe as it may be ingrown.  Also continue to educate on importance of wearing AFO at all times   Person(s) Educated Patient   Methods Explanation          PT Short Term Goals - 01/25/16 1243      PT SHORT TERM GOAL #1   Title =LTGs           PT Long Term Goals - 01/25/16 1243      PT LONG TERM GOAL #1   Title Pt will be independent with HEP in order to indicate improved functional mobility and decreased balance.  (Target Date: 02/22/16)   Time 4   Period Weeks   Status New     PT LONG TERM GOAL #2   Title Pt will improve BERG balance score to 32/56 in order to indicate decreased fall risk.     Time 4   Period Weeks   Status New     PT LONG TERM GOAL #3   Title Pt will improve gait speed to >/= 1.8 ft/sec in order to indicate decreased fall risk and improved efficiency of gait.    Time 4   Period Weeks   Status New     PT LONG TERM GOAL #4   Title Pt will ambulate x 200' over indoor sufaces with R AFO and LRAD at mod I level in order to indicate safe home negotiation.     Time 4   Period Weeks   Status New     PT LONG TERM GOAL #5   Title Pt will ambulate 500' over unlevel paved surfaces (including ramp/curb) w/ R AFO and LRAD at S level in order to indicate safe community negotiation.    Time 4   Period Weeks   Status New               Plan - 02/08/16 0809    Clinical Impression Statement Skilled session focused on NMR for RLE with forced use and gait with and without device as well as gait with RW to carryover all techniques  learned during NMR tasks.     Rehab Potential Good  PT Frequency 2x / week   PT Duration 4 weeks   PT Treatment/Interventions ADLs/Self Care Home Management;Electrical Stimulation;DME Instruction;Gait training;Stair training;Functional mobility training;Therapeutic activities;Therapeutic exercise;Balance training;Neuromuscular re-education;Patient/family education;Orthotic Fit/Training;Energy conservation;Vestibular   PT Next Visit Plan Continue orthotic training, R LE NMR, initiate balance HEP   Consulted and Agree with Plan of Care Patient      Patient will benefit from skilled therapeutic intervention in order to improve the following deficits and impairments:  Abnormal gait, Decreased activity tolerance, Decreased balance, Decreased endurance, Decreased knowledge of use of DME, Decreased mobility, Decreased safety awareness, Decreased strength, Impaired perceived functional ability, Impaired flexibility, Improper body mechanics, Postural dysfunction, Impaired sensation  Visit Diagnosis: 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 Muscle spasticity 10/04/2015  . 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 Right hemiparesis (HCC) 05/10/2014  . Abnormality of gait 05/10/2014  . Closed fracture of right hip (HCC) 11/24/2012  . 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 Stroke (HCC)   . Hypertension     Harriet ButteEmily Caya Soberanis, PT, MPT [facility] [address] [place], [place], [address] Phone: [phone]   Fax:  [phone] 02/08/16, 11:18 AM  Name: Sophia Sarahnthea Presley MRN: [id number] Date of Birth: 03/07/1951

## 2016-02-11 ENCOUNTER — Ambulatory Visit: Payer: Medicare Other | Admitting: Physical Therapy

## 2016-02-12 ENCOUNTER — Encounter: Payer: Self-pay | Admitting: Physical Therapy

## 2016-02-12 ENCOUNTER — Ambulatory Visit: Payer: Medicare Other | Admitting: Physical Therapy

## 2016-02-12 DIAGNOSIS — R2681 Unsteadiness on feet: Secondary | ICD-10-CM

## 2016-02-12 DIAGNOSIS — R2689 Other abnormalities of gait and mobility: Secondary | ICD-10-CM

## 2016-02-12 DIAGNOSIS — I69951 Hemiplegia and hemiparesis following unspecified cerebrovascular disease affecting right dominant side: Secondary | ICD-10-CM | POA: Diagnosis not present

## 2016-02-12 DIAGNOSIS — M6281 Muscle weakness (generalized): Secondary | ICD-10-CM

## 2016-02-12 NOTE — Patient Instructions (Addendum)
Feet Apart (Compliant Surface) Head Motion - Eyes Open    With eyes open, standing on compliant surface: feet shoulder width apart, move head slowly: up and down for 30 seconds; then left and right slowly. Repeat __3__ times per session. Do __1__ sessions per day.  Copyright  VHI. All rights reserved.      Feet Apart (Compliant Surface) Varied Arm Positions - Eyes Closed    Stand on compliant surface: with feet shoulder width apart and down at side. Close eyes and visualize upright position. Hold__30__ seconds. Repeat __3__ times per session. Do __1__ sessions per day.  Copyright  VHI. All rights reserved.

## 2016-02-12 NOTE — Therapy (Signed)
[facility] [address] [place], [place], [address] Phone: [phone]   Fax:  [phone]  Physical Therapy Treatment  Patient Details  Name: Sophia Rodriguez MRN: [id number] Date of Birth: Nov 18, 1950 Referring Provider: Dr. Allena KatzPatel  Encounter Date: 02/12/2016      PT End of Session - 02/12/16 0949    Visit Number 6   Number of Visits 9   Date for PT Re-Evaluation 03/25/16   Authorization Type MCR prim, BCBS sec-G code on every 10th visit   PT Start Time 0845   PT Stop Time 0930   PT Time Calculation (min) 45 min   Equipment Utilized During Treatment Gait belt   Activity Tolerance Patient tolerated treatment well   Behavior During Therapy [facility] for tasks assessed/performed      Past Medical History:  Diagnosis Date  . Arthritis   . Diabetes mellitus   . Hypertension   . Stroke [facility](HCC)     Past Surgical History:  Procedure Laterality Date  . HIP ARTHROPLASTY Right 11/24/2012   Procedure: RIGHT HIP HEMIARTHROPLASTY;  Surgeon: Verlee RossettiSteven R Norris, MD;  Location: [facility];  Service: Orthopedics;  Laterality: Right;  hemiatroplasty, DePuy Triloc    There were no vitals filed for this visit.      Subjective Assessment - 02/12/16 0851    Subjective Patient reported that she over did it on the weekend and was unable to come to PT due to pain in her right hip.   Patient is accompained by: Family member   Limitations House hold activities;Walking   Patient Stated Goals "To walk with this AFO"   Currently in Pain? Yes   Pain Score 2    Pain Location Hip   Pain Orientation Right   Pain Onset In the past 7 days          [facility] Adult PT Treatment/Exercise - 02/12/16 0940      High Level Balance   High Level Balance Activities Side stepping;Braiding;Backward walking;Marching forwards;Marching backwards   High Level Balance Comments Performed at ADL kitchen counter; 3 laps each; VC for technique, increased stance time on  R, and increased step length; Patient needing 2 rest breaks due to increased pain in R hip from 2/10 to 4/10.            Balance Exercises - 02/12/16 0945      Balance Exercises: Standing   Standing Eyes Opened Narrow base of support (BOS);Wide (BOA);Foam/compliant surface;Head turns;3 reps;30 secs;Limitations   Standing Eyes Closed Wide (BOA);Foam/compliant surface;Narrow base of support (BOS);3 reps;30 secs     Balance Exercises: Standing   Standing Eyes Opened Limitations Static standing on compliant surface with wide BOS; EO with head turns and nods progressing to EC w/o head movement. Narrow BOS EO with head turns and nods; EC no head turns; Frequent LOB.           PT Education - 02/12/16 0948    Education provided Yes   Education Details HEP provided. Pt educated on using a corner with chair in front for safety during HEP   Person(s) Educated Patient   Methods Explanation;Demonstration;Handout   Comprehension Verbalized understanding;Returned demonstration;Need further instruction          PT Short Term Goals - 01/25/16 1243      PT SHORT TERM GOAL #1   Title =LTGs           PT Long Term Goals - 01/25/16 1243      PT LONG  TERM GOAL #1   Title Pt will be independent with HEP in order to indicate improved functional mobility and decreased balance.  (Target Date: 02/22/16)   Time 4   Period Weeks   Status New     PT LONG TERM GOAL #2   Title Pt will improve BERG balance score to 32/56 in order to indicate decreased fall risk.     Time 4   Period Weeks   Status New     PT LONG TERM GOAL #3   Title Pt will improve gait speed to >/= 1.8 ft/sec in order to indicate decreased fall risk and improved efficiency of gait.    Time 4   Period Weeks   Status New     PT LONG TERM GOAL #4   Title Pt will ambulate x 200' over indoor sufaces with R AFO and LRAD at mod I level in order to indicate safe home negotiation.     Time 4   Period Weeks   Status New      PT LONG TERM GOAL #5   Title Pt will ambulate 500' over unlevel paved surfaces (including ramp/curb) w/ R AFO and LRAD at S level in order to indicate safe community negotiation.    Time 4   Period Weeks   Status New           Plan - 02/12/16 0950    Clinical Impression Statement Todays skilled session focused on providing the pt with an HEP focused on balance. Walking balance activities were first attempted but due to increased hip pain it was decided that static balance activities would be more beneficial. Patient is making slow progress towards goals and should benefit from continued PT to achieve unmet goals.   Rehab Potential Good   PT Frequency 2x / week   PT Duration 4 weeks   PT Treatment/Interventions ADLs/Self Care Home Management;Electrical Stimulation;DME Instruction;Gait training;Stair training;Functional mobility training;Therapeutic activities;Therapeutic exercise;Balance training;Neuromuscular re-education;Patient/family education;Orthotic Fit/Training;Energy conservation;Vestibular   PT Next Visit Plan Continue orthotic training, R LE NMR, progress balance HEP   Consulted and Agree with Plan of Care Patient      Patient will benefit from skilled therapeutic intervention in order to improve the following deficits and impairments:  Abnormal gait, Decreased activity tolerance, Decreased balance, Decreased endurance, Decreased knowledge of use of DME, Decreased mobility, Decreased safety awareness, Decreased strength, Impaired perceived functional ability, Impaired flexibility, Improper body mechanics, Postural dysfunction, Impaired sensation  Visit Diagnosis: 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 Muscle spasticity 10/04/2015  .  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 Right hemiparesis (HCC) 05/10/2014  . Abnormality of gait 05/10/2014  . Closed fracture of right hip (HCC) 11/24/2012  . 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 Stroke (HCC)   . Hypertension     Kallie LocksHannah Pamila Mendibles, Leda GauzeSPTA 02/12/2016, 3:46 PM  [facility] [address] [place], [place], [address] Phone: [phone]   Fax:  [phone]  Name: Sophia Rodriguez MRN: [id number] Date of Birth: 1950-10-07

## 2016-02-19 ENCOUNTER — Encounter: Payer: Self-pay | Admitting: Physical Therapy

## 2016-02-19 ENCOUNTER — Ambulatory Visit: Payer: Medicare Other | Admitting: Physical Therapy

## 2016-02-19 DIAGNOSIS — I69951 Hemiplegia and hemiparesis following unspecified cerebrovascular disease affecting right dominant side: Secondary | ICD-10-CM

## 2016-02-19 DIAGNOSIS — R2681 Unsteadiness on feet: Secondary | ICD-10-CM

## 2016-02-19 DIAGNOSIS — M6281 Muscle weakness (generalized): Secondary | ICD-10-CM

## 2016-02-19 DIAGNOSIS — R2689 Other abnormalities of gait and mobility: Secondary | ICD-10-CM

## 2016-02-19 NOTE — Therapy (Signed)
[facility] [place] [address] [place], [place], [address] Phone: [phone]   Fax:  [phone]  Physical Therapy Treatment  Patient Details  Name: Sophia Rodriguez MRN: [id number] Date of Birth: [birth year] Referring Provider: Dr. Posey Pronto  Encounter Date: 02/19/2016      PT End of Session - 02/19/16 1102    Visit Number 7   Number of Visits 9   Date for PT Re-Evaluation 03/25/16   Authorization Type MCR prim, BCBS sec-G code on every 10th visit   PT Start Time 0801   PT Stop Time 0845   PT Time Calculation (min) 44 min   Equipment Utilized During Treatment Gait belt   Activity Tolerance Patient tolerated treatment well   Behavior During Therapy [facility] for tasks assessed/performed      Past Medical History:  Diagnosis Date  . Arthritis   . Diabetes mellitus   . Hypertension   . Stroke [facility])     Past Surgical History:  Procedure Laterality Date  . HIP ARTHROPLASTY Right 11/24/2012   Procedure: RIGHT HIP HEMIARTHROPLASTY;  Surgeon: Augustin Schooling, MD;  Location: [facility];  Service: Orthopedics;  Laterality: Right;  hemiatroplasty, DePuy Triloc    There were no vitals filed for this visit.      Subjective Assessment - 02/19/16 0803    Subjective Patient reported she had her granddaughter help with her balance exercises. She tried some of the walking balance activities and noticed the grapevine irritated her hip but all other activities caused no pain.   Limitations House hold activities;Walking   Patient Stated Goals "To walk with this AFO"   Currently in Pain? No/denies           [facility] PT Assessment - 02/19/16 0812      Berg Balance Test   Sit to Stand Able to stand  independently using hands   Standing Unsupported Able to stand safely 2 minutes   Sitting with Back Unsupported but Feet Supported on Floor or Stool Able to sit safely and securely 2 minutes   Stand to Sit Controls descent by using hands    Transfers Able to transfer safely, definite need of hands   Standing Unsupported with Eyes Closed Able to stand 10 seconds with supervision   Standing Ubsupported with Feet Together Able to place feet together independently and stand for 1 minute with supervision   From Standing, Reach Forward with Outstretched Arm Can reach forward >5 cm safely (2")   From Standing Position, Pick up Object from [place] to pick up shoe, needs supervision   From Standing Position, Turn to Look Behind Over each Shoulder Needs supervision when turning  dizziness   Turn 360 Degrees Needs assistance while turning   Standing Unsupported, Alternately Place Feet on Step/Stool Needs assistance to keep from falling or unable to try   Standing Unsupported, One Foot in Front Needs help to step but can hold 15 seconds   Standing on One Leg Tries to lift leg/unable to hold 3 seconds but remains standing independently   Total Score 31   Berg comment: < 36 high risk for falls            [facility] Adult PT Treatment/Exercise - 02/19/16 1111      Ambulation/Gait   Ambulation/Gait Yes   Ambulation/Gait Assistance 5: Supervision   Ambulation/Gait Assistance Details To complete STGs. Verbal and visual cues for foot clearence on R and postural control.   Ambulation Distance (Feet) 345 Feet  x1   Assistive device Rolling walker   Gait Pattern Step-to pattern;Step-through pattern;Poor foot clearance - right;Trunk rotated posteriorly on right;Decreased trunk rotation;Right steppage;Decreased hip/knee flexion - right   Ambulation Surface Level;Indoor   Gait velocity 1.51           Balance Exercises - 02/19/16 1114      Balance Exercises: Standing   Standing Eyes Opened Narrow base of support (BOS);Head turns;3 reps;30 secs           PT Education - 02/19/16 1101    Education provided Yes   Education Details Reviewed HEP and discussed the importance of continuing exercises after discharge.   Person(s) Educated  Patient   Methods Explanation   Comprehension Verbalized understanding;Need further instruction          PT Short Term Goals - 01/25/16 1243      PT SHORT TERM GOAL #1   Title =LTGs           PT Long Term Goals - 02/19/16 3614      PT LONG TERM GOAL #1   Title Pt will be independent with HEP in order to indicate improved functional mobility and decreased balance.  (Target Date: 02/22/16)   Baseline 02/19/2016 Met   Time 4   Period Weeks   Status Achieved     PT LONG TERM GOAL #2   Title Pt will improve BERG balance score to 32/56 in order to indicate decreased fall risk.     Baseline 02/19/2016 Score 31/56 Improvement made, but below goal.   Time 4   Period Weeks   Status Partially Met     PT LONG TERM GOAL #3   Title Pt will improve gait speed to >/= 1.8 ft/sec in order to indicate decreased fall risk and improved efficiency of gait.    Baseline 02/19/2016 Gait speed 1.51 ft.sec, improvement from baseline, but goal not achieved.   Time 4   Period Weeks   Status Partially Met     PT LONG TERM GOAL #4   Title Pt will ambulate x 200' over indoor sufaces with R AFO and LRAD at mod I level in order to indicate safe home negotiation.     Baseline 02/19/16 Pt ambulated >200 feet with supervision with rolling walker   Time 4   Period Weeks   Status Partially Met     PT LONG TERM GOAL #5   Title Pt will ambulate 500' over unlevel paved surfaces (including ramp/curb) w/ R AFO and LRAD at S level in order to indicate safe community negotiation.    Time 4   Period Weeks   Status On-going           Plan - 02/19/16 1102    Clinical Impression Statement Today's skilled session focused on checking LTGs for discharge vs. renewal this week. Patient has achieved 1 LTG and has made progress towards others. She continues to demonstrate balance difficulties with head movement and should benefit from continue PT to address balance.   Rehab Potential Good   PT Frequency 2x /  week   PT Duration 4 weeks   PT Treatment/Interventions ADLs/Self Care Home Management;Electrical Stimulation;DME Instruction;Gait training;Stair training;Functional mobility training;Therapeutic activities;Therapeutic exercise;Balance training;Neuromuscular re-education;Patient/family education;Orthotic Fit/Training;Energy conservation;Vestibular   PT Next Visit Plan Complete checking LTGs for discharge or renewal over the next two visits; Continue with balance activities.   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 Muscle spasticity 10/04/2015  . 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 Right hemiparesis ([place]) 05/10/2014  . Abnormality of gait 05/10/2014  . Closed fracture of right hip ([place]) 11/24/2012  . Thrombotic cerebral infarction ([place]) 09/23/2012  . DM type 2 causing neurological disease ([place]) 08/26/2012  . Stroke ([employer])   . Hypertension     Benjiman Core, SPTA 02/19/2016, 2:36 PM  [facility] [address] [place], [place], [address] Phone: [phone]   Fax:  [phone]  Name: Sophia Rodriguez MRN: [id number] Date of Birth: April 21, 1950  This entire  session was performed under direct supervision and direction of a licensed therapist/therapist assistant . I have personally read, edited and approve of the note as written.  Willow Ora, PTA, [place] [address], [place], [place] [address] [phone] 02/19/16, 3:50 PM

## 2016-02-21 ENCOUNTER — Ambulatory Visit: Payer: Medicare Other | Admitting: Physical Therapy

## 2016-02-21 ENCOUNTER — Encounter: Payer: Self-pay | Admitting: Physical Therapy

## 2016-02-21 DIAGNOSIS — I69951 Hemiplegia and hemiparesis following unspecified cerebrovascular disease affecting right dominant side: Secondary | ICD-10-CM

## 2016-02-21 DIAGNOSIS — R2689 Other abnormalities of gait and mobility: Secondary | ICD-10-CM

## 2016-02-21 DIAGNOSIS — R2681 Unsteadiness on feet: Secondary | ICD-10-CM

## 2016-02-21 DIAGNOSIS — M6281 Muscle weakness (generalized): Secondary | ICD-10-CM

## 2016-02-21 NOTE — Therapy (Signed)
[facility] [address] [place], [place], [address] Phone: [phone]   Fax:  [phone]  Physical Therapy Treatment  Patient Details  Name: Sophia Rodriguez MRN: [id number] Date of Birth: April 15, 1950 Referring Provider: Dr. Posey Pronto  Encounter Date: 02/21/2016      PT End of Session - 02/21/16 1150    Visit Number 8   Number of Visits 9   Date for PT Re-Evaluation 03/25/16   Authorization Type MCR prim, BCBS sec-G code on every 10th visit   PT Start Time 0805   PT Stop Time 0844   PT Time Calculation (min) 39 min   Equipment Utilized During Treatment Gait belt   Activity Tolerance Patient tolerated treatment well   Behavior During Therapy [facility] for tasks assessed/performed      Past Medical History:  Diagnosis Date  . Arthritis   . Diabetes mellitus   . Hypertension   . Stroke [facility])     Past Surgical History:  Procedure Laterality Date  . HIP ARTHROPLASTY Right 11/24/2012   Procedure: RIGHT HIP HEMIARTHROPLASTY;  Surgeon: Augustin Schooling, MD;  Location: [facility];  Service: Orthopedics;  Laterality: Right;  hemiatroplasty, DePuy Triloc    There were no vitals filed for this visit.      Subjective Assessment - 02/21/16 0808    Subjective Patient reported we "wore her out" last session. She was sore for two days after treatment and unable to do her HEP.   Patient is accompained by: Family member   Limitations House hold activities;Walking   Patient Stated Goals "To walk with this AFO"   Currently in Pain? No/denies       Balance activities   All balance activities performed in corner on compliant surface: Static standing EC;3x 30 seconds, min guard EO with head turns, nods, and diagonals; 3x 30 seconds each; VC for technique; min guard Wall bumps; 10 reps x3; demo, tactile, and verbal cues for technique; min guard       PT Short Term Goals - 01/25/16 1243      PT SHORT TERM GOAL #1   Title =LTGs           PT Long Term Goals - 02/19/16 8616      PT LONG TERM GOAL #1   Title Pt will be independent with HEP in order to indicate improved functional mobility and decreased balance.  (Target Date: 02/22/16)   Baseline 02/19/2016 Met   Time 4   Period Weeks   Status Achieved     PT LONG TERM GOAL #2   Title Pt will improve BERG balance score to 32/56 in order to indicate decreased fall risk.     Baseline 02/19/2016 Score 31/56 Improvement made, but below goal.   Time 4   Period Weeks   Status Partially Met     PT LONG TERM GOAL #3   Title Pt will improve gait speed to >/= 1.8 ft/sec in order to indicate decreased fall risk and improved efficiency of gait.    Baseline 02/19/2016 Gait speed 1.51 ft.sec, improvement from baseline, but goal not achieved.   Time 4   Period Weeks   Status Partially Met     PT LONG TERM GOAL #4   Title Pt will ambulate x 200' over indoor sufaces with R AFO and LRAD at mod I level in order to indicate safe home negotiation.     Baseline 02/19/16 Pt ambulated >200 feet with supervision with rolling walker  Time 4   Period Weeks   Status Partially Met     PT LONG TERM GOAL #5   Title Pt will ambulate 500' over unlevel paved surfaces (including ramp/curb) w/ R AFO and LRAD at S level in order to indicate safe community negotiation.    Time 4   Period Weeks   Status On-going           Plan - 02/21/16 1151    Clinical Impression Statement Todays skilled session focused on balance activities to increase pt's functional independence. She is slowly progressing towards goals and should benefit from continued PT to achieve unmet goals.   Rehab Potential Good   PT Frequency 2x / week   PT Duration 4 weeks   PT Treatment/Interventions ADLs/Self Care Home Management;Electrical Stimulation;DME Instruction;Gait training;Stair training;Functional mobility training;Therapeutic activities;Therapeutic exercise;Balance training;Neuromuscular  re-education;Patient/family education;Orthotic Fit/Training;Energy conservation;Vestibular   PT Next Visit Plan Complete checking LTGs for discharge or renewal over the next two visits; Continue with balance activities.   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 Muscle spasticity 10/04/2015  . 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 Right hemiparesis ([place]) 05/10/2014  . Abnormality of gait 05/10/2014  . Closed fracture of right hip ([place]) 11/24/2012  . Thrombotic cerebral infarction ([place]) 09/23/2012  . 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 Stroke ([place])   . Hypertension     Benjiman Core, SPTA 02/21/2016, 11:54 AM  [place] [address] [place], [place], [address] Phone: [phone]   Fax:  [phone]  Name: Livia Tarr MRN: [id number] Date of Birth: [birth year]

## 2016-02-25 ENCOUNTER — Ambulatory Visit: Payer: Medicare Other | Attending: Physical Medicine & Rehabilitation | Admitting: Rehabilitation

## 2016-02-25 ENCOUNTER — Encounter: Payer: Self-pay | Admitting: Rehabilitation

## 2016-02-25 DIAGNOSIS — I69951 Hemiplegia and hemiparesis following unspecified cerebrovascular disease affecting right dominant side: Secondary | ICD-10-CM | POA: Insufficient documentation

## 2016-02-25 DIAGNOSIS — R2689 Other abnormalities of gait and mobility: Secondary | ICD-10-CM | POA: Insufficient documentation

## 2016-02-25 DIAGNOSIS — M6281 Muscle weakness (generalized): Secondary | ICD-10-CM | POA: Insufficient documentation

## 2016-02-25 DIAGNOSIS — R2681 Unsteadiness on feet: Secondary | ICD-10-CM | POA: Insufficient documentation

## 2016-02-25 NOTE — Therapy (Signed)
[facility] [address] [place], [place], [address] Phone: [phone]   Fax:  [phone]  Physical Therapy Treatment and D/C Summary  Patient Details  Name: Sophia Rodriguez MRN: [id number] Date of Birth: [birth year] Referring Provider: Dr. Posey Pronto  Encounter Date: 02/25/2016      PT End of Session - 02/25/16 0803    Visit Number 9   Number of Visits 9   Date for PT Re-Evaluation 03/25/16   Authorization Type MCR prim, BCBS sec-G code on every 10th visit   PT Start Time 0801   PT Stop Time 0845   PT Time Calculation (min) 44 min   Equipment Utilized During Treatment Gait belt   Activity Tolerance Patient tolerated treatment well   Behavior During Therapy [facility] for tasks assessed/performed      Past Medical History:  Diagnosis Date  . Arthritis   . Diabetes mellitus   . Hypertension   . Stroke [facility])     Past Surgical History:  Procedure Laterality Date  . HIP ARTHROPLASTY Right 11/24/2012   Procedure: RIGHT HIP HEMIARTHROPLASTY;  Surgeon: Augustin Schooling, MD;  Location: [facility];  Service: Orthopedics;  Laterality: Right;  hemiatroplasty, DePuy Triloc    There were no vitals filed for this visit.      Subjective Assessment - 02/25/16 0802    Subjective Pt reports no changes, wearing brace at home more often.     Patient is accompained by: Family member   Limitations House hold activities;Walking   Patient Stated Goals "To walk with this AFO"   Currently in Pain? No/denies                         [facility] Adult PT Treatment/Exercise - 02/25/16 0834      Transfers   Transfers Sit to Stand   Sit to Stand 6: Modified independent (Device/Increase time)   Stand to Sit 6: Modified independent (Device/Increase time)     Ambulation/Gait   Ambulation/Gait Yes   Ambulation/Gait Assistance 6: Modified independent (Device/Increase time);5: Supervision;4: Min guard   Ambulation/Gait Assistance Details  Ambulates with RW and R AFO at mod I level over indoor surfaces.  Still presents with gait deviations (decreased R step length, L step length) however no overt LOB and feel pt is safe to do this at home.  S for outdoor surfaces up to 500' paved surfaces due to cues for safety and adequate step length.  Continue to trial use of SPC vs quad tip cane with education to do this at home with close S to min/guard from family.   Feel that she is safe to use either SPC or quad tip cane (pending pt comfort) at home with S to min/guard and also encouraged her to practice at home and once she feels safe at home, begin to trial in the community with hands on assist from family due to external factors.  Pt verbalized understanding.     Ambulation Distance (Feet) 115 Feet  300 indoors, 500' outdoors   Assistive device Rolling walker;Straight cane  quad tip cane   Gait Pattern Step-to pattern;Step-through pattern;Poor foot clearance - right;Trunk rotated posteriorly on right;Decreased trunk rotation;Right steppage;Decreased hip/knee flexion - right   Ambulation Surface Level;Unlevel;Indoor;Outdoor;Paved   Gait velocity 2.07 ft/sec with cues for increased speed   Ramp 5: Supervision   Curb 5: Supervision   Curb Details (indicate cue type and reason) min cues for sequencing  PT Education - 02/25/16 0802    Education provided Yes   Education Details HEP, continuing exercise after D/C, beginning gait with cane at home with assist   Person(s) Educated Patient   Methods Explanation   Comprehension Verbalized understanding          PT Short Term Goals - 01/25/16 1243      PT SHORT TERM GOAL #1   Title =LTGs           PT Long Term Goals - 02/25/16 0804      PT LONG TERM GOAL #1   Title Pt will be independent with HEP in order to indicate improved functional mobility and decreased balance.  (Target Date: 02/22/16)   Baseline 02/19/2016 Met   Time 4   Period Weeks   Status  Achieved     PT LONG TERM GOAL #2   Title Pt will improve BERG balance score to 32/56 in order to indicate decreased fall risk.     Baseline 02/19/2016 Score 31/56 Improvement made, but below goal.   Time 4   Period Weeks   Status Partially Met     PT LONG TERM GOAL #3   Title Pt will improve gait speed to >/= 1.8 ft/sec in order to indicate decreased fall risk and improved efficiency of gait.    Baseline 02/19/2016 Gait speed 1.51 ft.sec, improvement from baseline, but goal not achieved. (2.07 ft/sec on 02/25/16 with cues for speed throughout).     Time 4   Period Weeks   Status Partially Met     PT LONG TERM GOAL #4   Title Pt will ambulate x 200' over indoor sufaces with R AFO and LRAD at mod I level in order to indicate safe home negotiation.     Baseline 02/25/16 met at mod I level (has gait deviations, but is safe to ambulate at home by herself).    Time 4   Period Weeks   Status Achieved     PT LONG TERM GOAL #5   Title Pt will ambulate 500' over unlevel paved surfaces (including ramp/curb) w/ R AFO and LRAD at S level in order to indicate safe community negotiation.    Baseline met 02/25/16    Time 4   Period Weeks   Status Achieved               Plan - 02/25/16 0803    Clinical Impression Statement Skilled session focused on addressing remaining LTGs.  Pt has met 3/5 LTGs and partially met remaining two goals.  Note pt able to increase gait speed but requires continuous cues for increased speed and improved R step length.  Pt given education to begin gait at home with [facility] vs quad tip cane with family assist.  Pt verbalized understanding.     Rehab Potential Good   PT Frequency 2x / week   PT Duration 4 weeks   PT Treatment/Interventions ADLs/Self Care Home Management;Electrical Stimulation;DME Instruction;Gait training;Stair training;Functional mobility training;Therapeutic activities;Therapeutic exercise;Balance training;Neuromuscular re-education;Patient/family  education;Orthotic Fit/Training;Energy conservation;Vestibular   Consulted and Agree with Plan of Care Patient      Patient will benefit from skilled therapeutic intervention in order to improve the following deficits and impairments:  Abnormal gait, Decreased activity tolerance, Decreased balance, Decreased endurance, Decreased knowledge of use of DME, Decreased mobility, Decreased safety awareness, Decreased strength, Impaired perceived functional ability, Impaired flexibility, Improper body mechanics, Postural dysfunction, Impaired sensation  Visit Diagnosis: Hemiplegia and hemiparesis following unspecified cerebrovascular disease affecting right  dominant side (HCC)  Unsteadiness on feet  Other abnormalities of gait and mobility  Muscle weakness (generalized)       G-Codes - 02/26/2016 1248    Functional Assessment Tool Used BERG: 31/56   Functional Limitation Mobility: Walking and moving around   Mobility: Walking and Moving Around Current Status [phone]) At least 40 percent but less than 60 percent impaired, limited or restricted   Mobility: Walking and Moving Around Goal Status [phone]) At least 40 percent but less than 60 percent impaired, limited or restricted   Mobility: Walking and Moving Around Discharge Status [phone]) At least 40 percent but less than 60 percent impaired, limited or restricted      PHYSICAL THERAPY DISCHARGE SUMMARY  Visits from Start of Care: 9  Current functional level related to goals / functional outcomes: See LTGs   Remaining deficits: Pt continues to have gait deviations associated with R hemiplegia, however feel that deficits are part attention related and poor habits as she can correct them with cues.     Education / Equipment: HEP  Plan: Patient agrees to discharge.  Patient goals were partially met. Patient is being discharged due to meeting the stated rehab goals.  ?????       Problem List Patient Active Problem List   Diagnosis  Date Noted  . Muscle spasticity 10/04/2015  . 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 Right hemiparesis ([place]) 05/10/2014  . Abnormality of gait 05/10/2014  . Closed fracture of right hip ([place]) 11/24/2012  . 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 Stroke ([place])   . Hypertension     Cameron Sprang, PT, MPT [facility] [address] [place], [place], [address] Phone: [phone]   Fax:  [phone] 02/26/16, 12:51 PM  Name: Shalika Arntz MRN: [id number] Date of Birth: 1950/06/08

## 2016-02-28 ENCOUNTER — Ambulatory Visit: Payer: Medicare Other | Admitting: Rehabilitation

## 2016-03-03 ENCOUNTER — Ambulatory Visit: Payer: Medicare Other | Admitting: Rehabilitation

## 2016-03-06 ENCOUNTER — Ambulatory Visit: Payer: Medicare Other | Admitting: Rehabilitation

## 2016-03-10 ENCOUNTER — Ambulatory Visit: Payer: Medicare Other | Admitting: Rehabilitation

## 2016-03-20 ENCOUNTER — Encounter: Payer: Self-pay | Admitting: Physical Medicine & Rehabilitation

## 2016-03-20 ENCOUNTER — Encounter: Payer: Medicare Other | Attending: Physical Medicine & Rehabilitation | Admitting: Physical Medicine & Rehabilitation

## 2016-03-20 VITALS — BP 128/78 | HR 81 | Resp 14

## 2016-03-20 DIAGNOSIS — I10 Essential (primary) hypertension: Secondary | ICD-10-CM | POA: Diagnosis not present

## 2016-03-20 DIAGNOSIS — Z8673 Personal history of transient ischemic attack (TIA), and cerebral infarction without residual deficits: Secondary | ICD-10-CM | POA: Insufficient documentation

## 2016-03-20 DIAGNOSIS — R269 Unspecified abnormalities of gait and mobility: Secondary | ICD-10-CM

## 2016-03-20 DIAGNOSIS — M62838 Other muscle spasm: Secondary | ICD-10-CM

## 2016-03-20 DIAGNOSIS — I6930 Unspecified sequelae of cerebral infarction: Secondary | ICD-10-CM | POA: Diagnosis not present

## 2016-03-20 DIAGNOSIS — E119 Type 2 diabetes mellitus without complications: Secondary | ICD-10-CM | POA: Insufficient documentation

## 2016-03-20 DIAGNOSIS — G8310 Monoplegia of lower limb affecting unspecified side: Secondary | ICD-10-CM

## 2016-03-20 DIAGNOSIS — I69898 Other sequelae of other cerebrovascular disease: Secondary | ICD-10-CM | POA: Insufficient documentation

## 2016-03-20 DIAGNOSIS — I69359 Hemiplegia and hemiparesis following cerebral infarction affecting unspecified side: Secondary | ICD-10-CM

## 2016-03-20 MED ORDER — BACLOFEN 10 MG PO TABS: 5.0000 mg | ORAL_TABLET | Freq: Three times a day (TID) | ORAL | 2 refills | Status: AC

## 2016-03-20 NOTE — Progress Notes (Signed)
Subjective:    Patient ID: Sophia SarahAnthea Rodriguez, female    DOB: 09-09-1950, 65 y.o.   MRN: [id number]  HPI  65 y/o female with pmh of strokes x3 (most recently ~2014), DM, HTN presents for follow up of spasticity.  The problem is mainly in her right leg.  It causes her difficulty with ambulation.  She uses a rolling walker currently and has not had any falls.  The problems started ~ 5 years ago getting worse over time. The biggest problems seems to be dragging of her foot.  It gets worse at the end of the day.  She has some associated weakness, but denies numbness/tingling.   Previously she was prescribed Baclofen 5, which did not work and 10, which caused her too much drowsiness.  She had Botox, but this was not effective, tizanidine made her too drowsy.   She was last seen in clinic on 02/06/16. Pt is poor historian. Presents with granddaughter.  She is wearing her brace. She was seen by therapies and told she may ambulate with cane with supervision.  Pt is happy about this.  Pt is a little more optimistic today about her progress.  She complains of intermittent headaches, but does want to take anything, even tylenol.    Pain Inventory Average Pain 1 Pain Right Now 0 My pain is tight  In the last 24 hours, has pain interfered with the following? General activity 4 Relation with others 3 Enjoyment of life 5 What TIME of day is your pain at its worst? evening Sleep (in general) Fair  Pain is worse with: standing Pain improves with: rest and medication Relief from Meds: 6  Mobility walk with assistance use a cane use a walker how many minutes can you walk? 2-3 ability to climb steps?  no do you drive?  no use a wheelchair Do you have any goals in this area?  no  Function I need assistance with the following:  household duties and shopping Do you have any goals in this area?  no  Neuro/Psych trouble walking confusion depression anxiety  Prior Studies Any changes since last  visit?  no  Physicians involved in your care Any changes since last visit?  no   Family History  Problem Relation Age of Onset  . Stroke Father   . Stroke Brother    Social History   Social History  . Marital status: Single    Spouse name: N/A  . Number of children: 2  . Years of education: Grad schoo   Social History Main Topics  . Smoking status: Former Smoker    Quit date: 05/28/2007  . Smokeless tobacco: Never Used  . Alcohol use No  . Drug use: No  . Sexual activity: Not Asked   Other Topics Concern  . None   Social History Narrative   Lives at home by herself.    Daughter come to help at home and she has a cleaning lady to help twice a week.    Caffeine: Every morning 1-1.5 cups coffee/day, and 12oz tea/day    Past Surgical History:  Procedure Laterality Date  . HIP ARTHROPLASTY Right 11/24/2012   Procedure: RIGHT HIP HEMIARTHROPLASTY;  Surgeon: Verlee RossettiSteven R Norris, MD;  Location: [facility];  Service: Orthopedics;  Laterality: Right;  hemiatroplasty, DePuy Triloc   Past Medical History:  Diagnosis Date  . Arthritis   . Diabetes mellitus   . Hypertension   . Stroke (HCC)    BP 128/78   Pulse  81   Resp 14   SpO2 93%   Opioid Risk Score:   Fall Risk Score:  `1  Depression screen PHQ 2/9  Depression screen [facility] 2/[facility] 01/11/2016 11/01/2015 07/13/2015 06/15/2015  Decreased Interest 0 0 1 1  Down, Depressed, Hopeless 0 0 1 1  PHQ - 2 Score 0 0 2 2  Altered sleeping - - - 3  Tired, decreased energy - - - 2  Change in appetite - - - 0  Feeling bad or failure about yourself  - - - 1  Trouble concentrating - - - 3  Moving slowly or fidgety/restless - - - 2  Suicidal thoughts - - - 0  PHQ-9 Score - - - 13  Difficult doing work/chores - - - Somewhat difficult   Review of Systems  Musculoskeletal: Positive for gait problem.  Neurological: Positive for weakness.  Psychiatric/Behavioral: Positive for dysphoric mood. Memory loss. All other systems reviewed and are  negative.     Objective:   Physical Exam HENT: Normocephalic, Atraumatic Eyes: EOMI. No discharge.  Cardio: RRR. No JVD. Pulm: B/l clear to auscultation.  Effort normal Abd: Soft, BS+ MSK:  Gait improved with AFO, improved cadence.   No TTP.    No edema. Neuro:   Sensation intact to light touch in all LE dermatomes  Reflexes 3+ RLE  Strength  4+/5 in RLE myotomes (stable)    5/5 in LLE myotomes MAS: Pt with difficulty relaxing and not resisting, however, ?appears to have increased tone RLE, more pronounced with ambulation. Skin: Warm and Dry    Assessment & Plan:  65 y/o female with pmh of strokes x3 (most recently ~3 years ago), DM, HTN presents for follow up of spasticity.   1. RLE spasticity s/p stroke  Side effects with Tizanidine, Baclofen  Encouraged muscle stretches.    Pt with AFO  Cont PT   Will cont to hold off on Botox injection for time being and allow for progress in therapies.    Encouraged presence of daughter.   Will order Baclofen 5 TID PRN to improve clearance.  Will consider change in medications in future.  Will cont to discuss alternative options.   2. Abnormality of gait  Toe walking with RLE, improving  Cont walker for safety, may progress to cane with supervision

## 2016-05-27 ENCOUNTER — Other Ambulatory Visit: Payer: Self-pay | Admitting: Internal Medicine

## 2016-05-27 DIAGNOSIS — E2839 Other primary ovarian failure: Secondary | ICD-10-CM

## 2016-06-06 ENCOUNTER — Ambulatory Visit
Admission: RE | Admit: 2016-06-06 | Discharge: 2016-06-06 | Disposition: A | Discharge status: Home / Self Care | Payer: Medicare Other | Source: Ambulatory Visit | Attending: Internal Medicine | Admitting: Internal Medicine

## 2016-06-06 DIAGNOSIS — E2839 Other primary ovarian failure: Secondary | ICD-10-CM

## 2016-06-10 ENCOUNTER — Encounter: Payer: Self-pay | Admitting: Neurology

## 2016-06-10 ENCOUNTER — Ambulatory Visit (INDEPENDENT_AMBULATORY_CARE_PROVIDER_SITE_OTHER): Payer: Medicare Other | Admitting: Neurology

## 2016-06-10 VITALS — BP 120/83 | HR 91 | Ht 67.0 in | Wt 171.0 lb

## 2016-06-10 DIAGNOSIS — G8110 Spastic hemiplegia affecting unspecified side: Secondary | ICD-10-CM | POA: Diagnosis not present

## 2016-06-10 MED ORDER — GABAPENTIN 300 MG PO CAPS: ORAL_CAPSULE | ORAL | 12 refills | Status: DC

## 2016-06-10 NOTE — Progress Notes (Signed)
[facility]   CC: Stroke   Interval history 06/10/2016: Patient is here for follow up. 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 She has right-sided weakness from left frontal stroke and left-sided weakness from right thalamic and right PLIC infarcts. She has right spastic hemiparesis. She continues to see Dr. Allena Katz in Rehab. She had side effects with tizanidine and baclofen. Dr. Allena Katz encourage stretching exercises and felt at this time Botox injections should be held off on.She feels stable. No falls. She is walking with a walker. She uses a wheelchair when she goes out for long distance. She is seeing her primary care and endorses compliance on all her medications and daily ASA 325. She is on gabapentin 3x a day and sometimes still wakes with pain behind the ear about 3x a week if she sleeps on that side. Will increase gabapentin only at night.  Interval history:  She does not feel that she got any better with PT. She is still having difficulty walking. She is dragging the right leg. She has stiffness and cramps in the right leg. No falls recently. She is using her walker all the time. She takes neurontin in the morning and night. Her depression comes and goes but she is feeling better and Dr. Pearlean Brownie increased her celexa. She has a right foot orthosis but doesn't wear it. Will refer to kirsteins and swartz.   MRi brain 12/01/2014:  FINDINGS: Diffuse prominence of the CSF containing spaces is compatible with generalized cerebral atrophy. Patchy and confluent T2/FLAIR hyperintensity within the periventricular and deep white matter is most consistent with chronic small vessel ischemic changes. Encephalomalacia within the parasagittal left frontal lobe most compatible with remote left ACA territory infarct.  Infarct involves the body of the corpus callosum and cingulate. Additional scattered remote lacunar infarcts within the bilateral basal ganglia, pons, and right cerebellar hemisphere. Remote infarct within the right thalamus as well. Infarcts within the right thalamus and right cerebellum appear new from previous.  No abnormal foci of restricted diffusion to suggest acute intracranial infarct. Gray-white matter differentiation maintained. Normal intravascular flow voids are preserved. No acute intracranial hemorrhage.  No mass lesion, midline shift, or mass effect. Mild ex vacuo dilatation of the left lateral ventricle related to the left frontal lobe infarct. No hydrocephalus. No extra-axial fluid collection.  Craniocervical junction within normal limits. Scattered multilevel degenerative changes present within the visualized upper cervical spine without significant stenosis. Pituitary gland normal.  No acute abnormality about the orbits.  Retention cyst noted within the right maxillary sinus. Scattered mucosal thickening within the ethmoidal air cells. Paranasal sinuses are otherwise clear. No mastoid effusion. Inner ear structures grossly normal.  Bone marrow signal intensity within normal limits. Scalp soft tissues unremarkable.  IMPRESSION: 1. No acute intracranial infarct or other process identified. 2. Generalized cerebral atrophy with chronic microvascular ischemic disease and multiple remote infarcts as above.  Update 11/08/2014 Dr. Pearlean Brownie : She returns for follow-up after last visit 6 months ago. She continues to have difficulty walking with stiffness and cramps and pain in the right leg with dragging. She's had balance issues with a few falls recently but fortunately has not hurt herself significantly. She said multiple strokes in the past and has had trouble walking and balance issues following each stroke. She was seen last by Dr. Daisy Blossom who recommended home physical therapy  but  this has not started yet. Patient states that time she gets disoriented loses track of times up to 30 minutes this may happen daily while she was watching TV or sitting quietly. She admits to feeling depressed and has been on Celexa 10 mg daily for last few years and has never tried a higher dose. She also takes gabapentin but only 300 mg once a day. She denies tingling numbness and burning in her feet but she did have no conduction study done by Dr. 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 She does have history of diabetes in the past but she has been off medications for a year and a half.   HPI: Sophia Rodriguez is a 66 y.o. female here as a referral from Dr. Concepcion Elk for stroke. She has a PMHx of HTN, DM. 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 First stroke 5 years ago. Had 2 a few years ago. She is compliant with medications. Her gait has worsened. She used to use a cane and now she is using a walker. She can't get around like she used to. She feels weak. She is having word-finding difficulty. Sometimes when she is laying in bed it hurts in her rightear, no dizziness or room spinning. It just hurts. She can't sleep with her right arm extended because the arm goes numb. (Very poor historian, psychomotor slowing). She can't close her right hand all the way, some numbness in the fingers. She has difficulty using right arm. Sometimes her left hand "goes and goes and goes" and she can't stop it. She has to stop herself by touching it, if she makes a concerted effort she can stop her hand. She has neck pain and when she moves it a certain way she can feel pain but no radicular symptoms. She lives alone in her apartment. She has a cleaning person, daughter helps with medications. She babysits her grandaughter every day, she is 8. No falls. She is on aspirin for stroke prevention, reports she was not on aspirin during her last strokes. She has  a primary care physician that she likes very much and manages her medications, cholesterol, glucose and blood pressure.   Reviewed notes, labs and imaging from outside physicians, which showed:   CT of the head 12/2012: A remote infarct is again noted at the left frontal lobe, with associated encephalomalacia. This is grossly unchanged in appearance. There is mild ex vacuo dilatation of the frontal horn of the left lateral ventricle.  Scattered periventricular white matter change likely reflects small vessel ischemic microangiopathy. The previously noted small infarct at the posterior limb of the right internal capsule is not well characterized.  The posterior fossa, including the cerebellum, brainstem and fourth ventricle, is within normal limits. No mass effect or midline shift is seen.  There is no evidence of fracture; visualized osseous structures are unremarkable in appearance. The orbits are within normal limits. The paranasal sinuses and mastoid air cells are well-aerated. No significant soft tissue abnormalities are seen.  IMPRESSION:  1. No acute intracranial pathology seen to explain the patient's symptoms. 2. Remote infarct again noted at the left frontal lobe, with associated encephalomalacia and mild ex vacuo dilatation of the frontal horn of the left lateral ventricle. 3. Scattered small vessel ischemic microangiopathy.  MRI of the brain 09/2012: IMPRESSION: 1. Acute ischemic infarct involving the posterior limb of the right internal capsule, at or just adjacent to recently identified acute ischemic infarct seen in  this region on MRI from 08/26/2012. 2. Stable appearance of additional remote infarcts as detailed Above.  (Reviewed images and agree with above findings.)  MRA of the head 08/2012: MRA HEAD  Findings: Cavernous segment internal carotid artery narrowing and irregularity more notable on the left.  Hypoplastic A1 segment right anterior  cerebral artery with narrowing and irregularity suggesting superimposed atherosclerotic type changes.  Mild narrowing M1 segment right middle cerebral artery.  Middle cerebral artery branch vessel mild to moderate irregularity narrowing.  Mild irregularity of the A2 segment of the anterior cerebral artery bilaterally.  Ectatic vertebral arteries and basilar artery. The left vertebral artery is dominant. Very mild narrowing of the left vertebral artery after the takeoff of the left PICA. Mild narrowing distal right vertebral artery.  Irregularity and narrowing of the right PICA which is smaller than the left PICA.  Mild irregularity of the basilar artery without high-grade stenosis. Nonvisualization AICA bilaterally.  Moderate narrowing the superior cerebellar artery bilaterally.  Mild narrowing proximal right posterior cerebral artery. P2 segment the posterior cerebral artery mild to moderate irregularity greater on the right. Posterior cerebral artery distal branch vessel narrowing irregularity bilaterally.  No aneurysm or vascular malformation noted.  MRA of the neck: 08/2012:   IMPRESSION: No evidence hemodynamically significant stenosis involving either carotid bifurcation.  Left vertebral artery is dominant. Ectatic proximal left vertebral artery with fold and mild narrowing. Minimal narrowing proximal right vertebral artery.  Cbc 12/2012 with anemia, bmp 12/2012 with gfr 76 (N>90). No recent labs available to review.  2d echo and carotid dopplers 08/2012: Study Conclusions  Left ventricle: The cavity size was normal. Wall thickness was normal. Systolic function was normal. The estimated ejection fraction was in the range of 60% to 65%. Wall motion was normal; there were no regional wall motion abnormalities.   Carotid Summary: - Findings consistent with less than 39 percent stenosis involving the right internal carotid artery and the left internal  carotid artery. - Bilateral vertebral artery flow antegrade.  Review of Systems: Patient complains of symptoms per HPI as well as the following symptoms: depression, dec concentration.  No CP, no SOB  Pertinent negatives per HPI. All others negative.   Social History   Social History  . Marital status: Single    Spouse name: N/A  . Number of children: 2  . Years of education: Grad school   Occupational History  . Not on file.   Social History Main Topics  . Smoking status: Former Smoker    Quit date: 05/28/2007  . Smokeless tobacco: Never Used  . Alcohol use No  . Drug use: No  . Sexual activity: Not on file   Other Topics Concern  . Not on file   Social History Narrative   Lives at home by herself.    Daughter come to help at home and she has a cleaning lady to help twice a week.    Caffeine: Every morning 1-1.5 cups coffee/day, and 12oz tea/day    Right-handed    Family History  Problem Relation Age of Onset  . Stroke Father   . Stroke Brother     Past Medical History:  Diagnosis Date  . Arthritis   . Diabetes mellitus   . Hypertension   . Stroke [facility])     Past Surgical History:  Procedure Laterality Date  . HIP ARTHROPLASTY Right 11/24/2012   Procedure: RIGHT HIP HEMIARTHROPLASTY;  Surgeon: Verlee Rossetti, MD;  Location: [facility];  Service: Orthopedics;  Laterality:  Right;  hemiatroplasty, DePuy Triloc    Current Outpatient Prescriptions  Medication Sig Dispense Refill  . aspirin 325 MG tablet Take 1 tablet (325 mg total) by mouth daily.    Marland Kitchen atorvastatin (LIPITOR) 40 MG tablet Take 1 tablet (40 mg total) by mouth daily at 6 PM. 30 tablet 1  . cetirizine (ZYRTEC) 10 MG tablet Take 10 mg by mouth daily.    . furosemide (LASIX) 20 MG tablet TK 1 T PO  QD PRF LEG SWELLING  0  . gabapentin (NEURONTIN) 300 MG capsule Take 1 capsule twice daily and 2 capsules before bed. 120 capsule 12  . lisinopril (PRINIVIL,ZESTRIL) 10 MG tablet Take 10 mg by mouth daily.      . Multiple Vitamin (MULTIVITAMIN WITH MINERALS) TABS Take 1 tablet by mouth daily.    Marland Kitchen olopatadine (PATANOL) 0.1 % ophthalmic solution INSTILL 1 GTT IN OU BID  12  . Vitamin D, Ergocalciferol, (DRISDOL) 50000 UNITS CAPS capsule Take 50,000 Units by mouth every 7 (seven) days.     . VOLTAREN 1 % GEL Apply 1 application topically as needed.  5  . zolpidem (AMBIEN) 10 MG tablet Take 10 mg by mouth at bedtime as needed.  5   No current facility-administered medications for this visit.     Allergies as of 06/10/2016  . (No Known Allergies)    Vitals: BP 120/83   Pulse 91   Ht 5\' 7"  (1.702 m)   Wt 171 lb (77.6 kg)   BMI 26.78 kg/m  Last Weight:  Wt Readings from Last 1 Encounters:  06/10/16 171 lb (77.6 kg)   Last Height:   Ht Readings from Last 1 Encounters:  06/10/16 5\' 7"  (1.702 m)   Cognition:  The patient is oriented to person, month day, year not date  Cranial Nerves:   The pupils are equal, round, and reactive to light.  Visual fields are full to finger confrontation. Extraocular movements are intact. Trigeminal sensation is intact and the muscles of mastication are normal. Left NL flattening and lower left facial weakness. The palate elevates in the midline. Hearing intact. Voice is normal. Shoulder shrug is normal. The tongue has normal motion without fasciculations.   Coordination:  Bradykinesia with FTN and finger taps left > right. Cannot do HTS.   Gait:  Antalgic, dragging the right leg slightly, mildy wide based and short steps.   Motor Observation: Mild left pronator drift  no involuntary movements noted. Tone:  mildly increased tone right leg  Posture:  Posture is mildly stooped   Strength: Strength in the upper improved, 4/5 of troceps but intact deltoid and biceps. 4/5 bilateral hip flexion. Right leg flexion 4+, leg extension 3/5. Foot drop right. Intact strength distally left leg.     Sensation: intact to LT   Reflex  Exam:  DTR's: Absent patellars. Brisk otherwise Toes:  The toes are equivocal bilaterally.  Clonus:  Clonus is absent.    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 She continues to have gait difficulty but is improving and strength has improved as well.  She has been on ASA 325mg  since being discharged from stroke service in 2014. Etiology of strokes felt to be from small-vessel  disease.    -Repeat MRI of the brain was stable stable - Continue Baclofen for spasticity, 1/2 tablet three times a day and feels it helps no side effects - fall risk, discussed fall precautions -Continue ASA 325 orally every day for secondary stroke prevention and maintain strict control of hypertension with blood pressure goal below 130/90, diabetes with hemoglobin A1c goal below 6.5% and lipids with LDL cholesterol goal below 70 mg/dL.  - Continue with Dr. Allena KatzPatel in Rehab - fall risk, advised to always use walking aids and wear her right foot orthosis -  Needs close follow up with pcp for tight control of vascular risk factors. - f/u 1 year  Sophia DeanAntonia Rosalee Tolley, MD  [facility] [address] [place], [place] [id number]  Phone [phone] Fax [phone]  A total of 25 minutes was spent face-to-face with this patient. Over half this time was spent on counseling patient on the right spastic hemiparesis diagnosis and different diagnostic and therapeutic options available.

## 2016-06-10 NOTE — Patient Instructions (Signed)
Remember to drink plenty of fluid, eat healthy meals and do not skip any meals. Try to eat protein with a every meal and eat a healthy snack such as fruit or nuts in between meals. Try to keep a regular sleep-wake schedule and try to exercise daily, particularly in the form of walking, 20-30 minutes a day, if you can.   As far as your medications are concerned, I would like to suggest: Increase Gabapentin at bedtime  I would like to see you back in 1 year, sooner if we need to. Please call us with any interim questions, concerns, problems, updates or refill requests.   Our phone number is [phone]. We also have an after hours call service for urgent matters and there is a physician on-call for urgent questions. For any emergencies you know to call 911 or go to the nearest emergency room

## 2016-06-12 ENCOUNTER — Encounter: Payer: Medicare Other | Attending: Physical Medicine & Rehabilitation | Admitting: Physical Medicine & Rehabilitation

## 2016-06-12 ENCOUNTER — Encounter: Payer: Self-pay | Admitting: Physical Medicine & Rehabilitation

## 2016-06-12 VITALS — BP 126/77 | HR 90

## 2016-06-12 DIAGNOSIS — R269 Unspecified abnormalities of gait and mobility: Secondary | ICD-10-CM | POA: Diagnosis not present

## 2016-06-12 DIAGNOSIS — I10 Essential (primary) hypertension: Secondary | ICD-10-CM | POA: Insufficient documentation

## 2016-06-12 DIAGNOSIS — E119 Type 2 diabetes mellitus without complications: Secondary | ICD-10-CM | POA: Diagnosis not present

## 2016-06-12 DIAGNOSIS — Z8673 Personal history of transient ischemic attack (TIA), and cerebral infarction without residual deficits: Secondary | ICD-10-CM | POA: Insufficient documentation

## 2016-06-12 DIAGNOSIS — M62838 Other muscle spasm: Secondary | ICD-10-CM

## 2016-06-12 DIAGNOSIS — I69898 Other sequelae of other cerebrovascular disease: Secondary | ICD-10-CM | POA: Diagnosis present

## 2016-06-12 DIAGNOSIS — G8310 Monoplegia of lower limb affecting unspecified side: Secondary | ICD-10-CM | POA: Diagnosis not present

## 2016-06-12 DIAGNOSIS — I6930 Unspecified sequelae of cerebral infarction: Secondary | ICD-10-CM | POA: Diagnosis not present

## 2016-06-12 MED ORDER — BACLOFEN 10 MG PO TABS: 10.0000 mg | ORAL_TABLET | Freq: Three times a day (TID) | ORAL | 1 refills | Status: DC

## 2016-06-12 NOTE — Progress Notes (Addendum)
Subjective:    Patient ID: Sophia Rodriguez, female    DOB: [birth year], 67 y.o.   MRN: [id number]  HPI  66 y/o female with pmh of strokes x3 (most recently ~2014), DM, HTN presents for follow up of spasticity.   Initially stated: The problem is mainly in her right leg.  It causes her difficulty with ambulation.  She uses a rolling walker currently and has not had any falls.  The problems started ~ 5 years ago getting worse over time. The biggest problems seems to be dragging of her foot.  It gets worse at the end of the day.  She has some associated weakness, but denies numbness/tingling.  Previously she was prescribed Baclofen 5, which did not work and 10, which caused her too much drowsiness.  She had Botox, but this was not effective, tizanidine made her too drowsy.   She was last seen in clinic on 03/20/16. Patient alone today and poor historian.  Per chart review, patient saw Neurology and notes patient to be stable.  She continues to take Baclofen.  She continues to do muscle stretches and wear her brace.  She completed therapies.  She continues to use rolling walker.  She denies falls.   Pain Inventory Average Pain 3 Pain Right Now 1 My pain is tingling  In the last 24 hours, has pain interfered with the following? General activity 5 Relation with others 4 Enjoyment of life 4 What TIME of day is your pain at its worst? evening Sleep (in general) Fair  Pain is worse with: walking Pain improves with: rest Relief from Meds: 8  Mobility walk with assistance use a walker ability to climb steps?  no do you drive?  no  Function disabled: date disabled . I need assistance with the following:  household duties and shopping  Neuro/Psych trouble walking confusion depression  Prior Studies Any changes since last visit?  no  Physicians involved in your care Any changes since last visit?  no   Family History  Problem Relation Age of Onset  . Stroke Father   . Stroke  Brother    Social History   Social History  . Marital status: Single    Spouse name: N/A  . Number of children: 2  . Years of education: Grad school   Social History Main Topics  . Smoking status: Former Smoker    Quit date: 05/28/2007  . Smokeless tobacco: Never Used  . Alcohol use No  . Drug use: No  . Sexual activity: Not Asked   Other Topics Concern  . None   Social History Narrative   Lives at home by herself.    Daughter come to help at home and she has a cleaning lady to help twice a week.    Caffeine: Every morning 1-1.5 cups coffee/day, and 12oz tea/day    Right-handed   Past Surgical History:  Procedure Laterality Date  . HIP ARTHROPLASTY Right 11/24/2012   Procedure: RIGHT HIP HEMIARTHROPLASTY;  Surgeon: Verlee Rossetti, MD;  Location: [facility];  Service: Orthopedics;  Laterality: Right;  hemiatroplasty, DePuy Triloc   Past Medical History:  Diagnosis Date  . Arthritis   . Diabetes mellitus   . Hypertension   . Stroke (HCC)    BP 126/77   Pulse 90   SpO2 96%   Opioid Risk Score:   Fall Risk Score:  `1  Depression screen PHQ 2/9  Depression screen [facility] 2/9 06/12/2016 01/11/2016 11/01/2015 07/13/2015 06/15/2015  Decreased Interest  1 0 0 1 1  Down, Depressed, Hopeless 1 0 0 1 1  PHQ - 2 Score 2 0 0 2 2  Altered sleeping - - - - 3  Tired, decreased energy - - - - 2  Change in appetite - - - - 0  Feeling bad or failure about yourself  - - - - 1  Trouble concentrating - - - - 3  Moving slowly or fidgety/restless - - - - 2  Suicidal thoughts - - - - 0  PHQ-9 Score - - - - 13  Difficult doing work/chores - - - - Somewhat difficult   Review of Systems  Musculoskeletal: Positive for gait problem.  Neurological: Positive for weakness.  Psychiatric/Behavioral: Positive for dysphoric mood. Memory loss. All other systems reviewed and are negative.     Objective:   Physical Exam HENT: Normocephalic, Atraumatic Eyes: EOMI. No discharge.  Cardio: RRR. No  JVD. Pulm: B/l clear to auscultation.  Effort normal. Abd: Soft, BS+ MSK:  Gait improved with AFO, improved cadence.   No TTP.    No edema. Neuro:   Sensation intact to light touch in all LE dermatomes  Strength  4+/5 in RLE myotomes (unchanged)    5/5 in LLE myotomes MAS: Pt with difficulty relaxing and not resisting, however, ?appears to have increased tone RLE, more pronounced with ambulation. Skin: Warm and Dry    Assessment & Plan:  66 y/o female with pmh of strokes x3 (most recently ~2014), DM, HTN presents for follow up of spasticity.   1. RLE spasticity s/p stroke  Side effects with Tizanidine  Encouraged muscle stretches.    Pt with AFO  Completed PT, cont HEP  Encouraged presence of daughter.  Will increase Baclofen to 10TID PRN to improve foot clearance.    Will consider repeat Botox injection after trial of Baclofen  2. Abnormality of gait  Toe walking with RLE, improving  Cont walker for safety, cane with supervision at home  Pt would like to walk with a cane at all times as her goal

## 2016-06-18 ENCOUNTER — Telehealth: Payer: Self-pay | Admitting: Neurology

## 2016-06-18 ENCOUNTER — Ambulatory Visit: Payer: Medicare Other | Admitting: Physical Medicine & Rehabilitation

## 2016-06-18 NOTE — Telephone Encounter (Signed)
Patient takes 2 gabapentin (NEURONTIN) 300 MG capsule at night and is making her too sleepy. She had a fall getting into bed last night and was dizzy. She had to call her daughter to help get her up. Can she go back to taking 1 pill at night instead of 2.

## 2016-06-18 NOTE — Telephone Encounter (Signed)
Returned call to patient. Advised her to decrease gabapentin back down to 300 mg at night since she's had side effects on higher dose. Verbalized understanding and appreciation for call.

## 2016-07-10 ENCOUNTER — Encounter: Payer: Self-pay | Admitting: Physical Medicine & Rehabilitation

## 2016-07-10 ENCOUNTER — Encounter: Payer: Medicare Other | Attending: Physical Medicine & Rehabilitation | Admitting: Physical Medicine & Rehabilitation

## 2016-07-10 VITALS — BP 152/84 | HR 99

## 2016-07-10 DIAGNOSIS — Z8673 Personal history of transient ischemic attack (TIA), and cerebral infarction without residual deficits: Secondary | ICD-10-CM | POA: Diagnosis not present

## 2016-07-10 DIAGNOSIS — G8310 Monoplegia of lower limb affecting unspecified side: Secondary | ICD-10-CM | POA: Diagnosis not present

## 2016-07-10 DIAGNOSIS — I10 Essential (primary) hypertension: Secondary | ICD-10-CM | POA: Insufficient documentation

## 2016-07-10 DIAGNOSIS — M62838 Other muscle spasm: Secondary | ICD-10-CM

## 2016-07-10 DIAGNOSIS — E119 Type 2 diabetes mellitus without complications: Secondary | ICD-10-CM | POA: Insufficient documentation

## 2016-07-10 DIAGNOSIS — I6930 Unspecified sequelae of cerebral infarction: Secondary | ICD-10-CM | POA: Diagnosis not present

## 2016-07-10 DIAGNOSIS — I69898 Other sequelae of other cerebrovascular disease: Secondary | ICD-10-CM | POA: Diagnosis present

## 2016-07-10 DIAGNOSIS — R269 Unspecified abnormalities of gait and mobility: Secondary | ICD-10-CM | POA: Insufficient documentation

## 2016-07-10 MED ORDER — BACLOFEN 10 MG PO TABS: 5.0000 mg | ORAL_TABLET | Freq: Three times a day (TID) | ORAL | 1 refills | Status: DC

## 2016-07-10 NOTE — Progress Notes (Addendum)
Subjective:    Patient ID: Sophia Rodriguez, female    DOB: Aug 06, 1950, 66 y.o.   MRN: [id number]  HPI  66 y/o female with pmh of strokes x3 (most recently ~2014), DM, HTN presents for follow up of spasticity.   Initially stated: The problem is mainly in her right leg.  It causes her difficulty with ambulation.  She uses a rolling walker currently and has not had any falls.  The problems started ~ 5 years ago getting worse over time. The biggest problems seems to be dragging of her foot.  It gets worse at the end of the day.  She has some associated weakness, but denies numbness/tingling.  Previously she was prescribed Baclofen 5, which did not work and 10, which caused her too much drowsiness.  She had Botox, but this was not effective, tizanidine made her too drowsy.   She was last seen in clinic on 06/12/16. Since last visit, she continues to do stretching.  She is doing HEP.  She notes 2 changes in medications at the time of her last visit and had a fall from bed.  Pt is very sensitive to medication changes.  She cut back the dose of the Baclofen and would now like to proceed with Botox.     Pain Inventory Average Pain 0 Pain Right Now 0 My pain is tingling  In the last 24 hours, has pain interfered with the following? General activity 5 Relation with others 4 Enjoyment of life 4 What TIME of day is your pain at its worst? evening Sleep (in general) Fair  Pain is worse with: walking Pain improves with: rest Relief from Meds: 8  Mobility walk with assistance use a walker ability to climb steps?  no do you drive?  no  Function disabled: date disabled . I need assistance with the following:  household duties and shopping  Neuro/Psych trouble walking confusion depression  Prior Studies Any changes since last visit?  no  Physicians involved in your care Any changes since last visit?  no   Family History  Problem Relation Age of Onset  . Stroke Father   . Stroke  Brother    Social History   Social History  . Marital status: Single    Spouse name: N/A  . Number of children: 2  . Years of education: Grad school   Social History Main Topics  . Smoking status: Former Smoker    Quit date: 05/28/2007  . Smokeless tobacco: Never Used  . Alcohol use No  . Drug use: No  . Sexual activity: Not Asked   Other Topics Concern  . None   Social History Narrative   Lives at home by herself.    Daughter come to help at home and she has a cleaning lady to help twice a week.    Caffeine: Every morning 1-1.5 cups coffee/day, and 12oz tea/day    Right-handed   Past Surgical History:  Procedure Laterality Date  . HIP ARTHROPLASTY Right 11/24/2012   Procedure: RIGHT HIP HEMIARTHROPLASTY;  Surgeon: Verlee Rossetti, MD;  Location: [facility];  Service: Orthopedics;  Laterality: Right;  hemiatroplasty, DePuy Triloc   Past Medical History:  Diagnosis Date  . Arthritis   . Diabetes mellitus   . Hypertension   . Stroke (HCC)    BP (!) 152/84   Pulse 99   SpO2 98%   Opioid Risk Score:   Fall Risk Score:  `1  Depression screen PHQ 2/9  Depression screen  [facility] 2/9 06/12/2016 01/11/2016 11/01/2015 07/13/2015 06/15/2015  Decreased Interest 1 0 0 1 1  Down, Depressed, Hopeless 1 0 0 1 1  PHQ - 2 Score 2 0 0 2 2  Altered sleeping - - - - 3  Tired, decreased energy - - - - 2  Change in appetite - - - - 0  Feeling bad or failure about yourself  - - - - 1  Trouble concentrating - - - - 3  Moving slowly or fidgety/restless - - - - 2  Suicidal thoughts - - - - 0  PHQ-9 Score - - - - 13  Difficult doing work/chores - - - - Somewhat difficult   Review of Systems  Musculoskeletal: Positive for gait problem.  Neurological: Positive for weakness.  Psychiatric/Behavioral: Positive for dysphoric mood. Memory loss. All other systems reviewed and are negative.     Objective:   Physical Exam HENT: Normocephalic, Atraumatic Eyes: EOMI. No discharge.  Cardio: RRR. No  JVD. Pulm: B/l clear to auscultation.  Effort normal. Abd: Soft, BS+ MSK:  Gait improved with AFO, improved cadence.   No TTP.    No edema. Neuro:   Sensation intact to light touch in all LE dermatomes  Strength  4+/5 in RLE myotomes (stable)    5/5 in LLE myotomes MAS: Pt with difficulty relaxing, ?appears to have increased tone RLE ?1+/4, more pronounced with ambulation. Skin: Warm and Dry. Intact.     Assessment & Plan:  66 y/o female with pmh of strokes x3 (most recently ~2014), DM, HTN presents for follow up of spasticity.   1. RLE spasticity s/p stroke  Side effects with Tizanidine  Encouraged muscle stretches.    Pt with AFO  Completed PT, cont HEP  Encouraged presence of daughter.  Pt unable to tolerate Baclofen 10TID, decreased to 5 TID.   Pt would like to proceed with botulinum toxin injection   Last injection: Right medial gastroc: 20U Botox, plan to increase to 150U Dysport     Right lateral gastroc: 20U Botox, plan to increase to 150U Dysport  Will consider PT referral after injeciton  2. Abnormality of gait  Toe walking with RLE, improving  Cont walker for safety, cane with supervision at home  Pt would like to walk with a cane at all times as her goal

## 2016-07-25 ENCOUNTER — Encounter: Payer: Self-pay | Admitting: Physical Medicine & Rehabilitation

## 2016-07-25 ENCOUNTER — Encounter: Payer: Medicare Other | Attending: Physical Medicine & Rehabilitation | Admitting: Physical Medicine & Rehabilitation

## 2016-07-25 VITALS — BP 118/86 | HR 89

## 2016-07-25 DIAGNOSIS — I69898 Other sequelae of other cerebrovascular disease: Secondary | ICD-10-CM | POA: Diagnosis present

## 2016-07-25 DIAGNOSIS — I10 Essential (primary) hypertension: Secondary | ICD-10-CM | POA: Insufficient documentation

## 2016-07-25 DIAGNOSIS — G8311 Monoplegia of lower limb affecting right dominant side: Secondary | ICD-10-CM

## 2016-07-25 DIAGNOSIS — Z8673 Personal history of transient ischemic attack (TIA), and cerebral infarction without residual deficits: Secondary | ICD-10-CM | POA: Insufficient documentation

## 2016-07-25 DIAGNOSIS — E119 Type 2 diabetes mellitus without complications: Secondary | ICD-10-CM | POA: Diagnosis not present

## 2016-07-25 DIAGNOSIS — G8310 Monoplegia of lower limb affecting unspecified side: Secondary | ICD-10-CM

## 2016-07-25 DIAGNOSIS — G8314 Monoplegia of lower limb affecting left nondominant side: Secondary | ICD-10-CM | POA: Diagnosis not present

## 2016-07-25 DIAGNOSIS — R269 Unspecified abnormalities of gait and mobility: Secondary | ICD-10-CM | POA: Diagnosis not present

## 2016-07-25 NOTE — Progress Notes (Signed)
Dysport: Procedure Note Patient Name: Sophia Rodriguez DOB: 06-05-1950 MRN: [id number]   Procedure: Botulinum toxin administration Guidance: EMG Diagnosis: Spastic monoplegia Date: 07/25/16 Attending: Maryla MorrowAnkit Patel, MD    Informed consent: Risks, benefits & options of the procedure are explained to the patient (and/or family). The patient elects to proceed with procedure. Risks include but are not limited to weakness, respiratory distress, dry mouth, ptosis, antibody formation, worsening of some areas of function. Benefits include decreased abnormal muscle tone, improved hygiene and positioning, decreased skin breakdown and, in some cases, decreased pain. Options include conservative management with oral antispasticity agents, phenol chemodenervation of nerve or at motor nerve branches. More invasive options include intrathecal balcofen adminstration for appropriate candidates. Surgical options may include tendon lengthening or transposition or, rarely, dorsal rhizotomy.   History/Physical Examination: 66 y.o. female with pmh of strokes x3 (most recently ~2014), DM, HTN presents for follow up of spasticity.  mAS: Pt with difficulty relaxing, appears to have increased tone RLE ?1+/4, more pronounced with ambulation. Previous Treatments: Therapy/Range of motion/Bracing/Medications Indication for guidance: Target active muscules  Procedure: Botulinum toxin was mixed with preservative free saline with a dilution of 0.5cc to 100 units. Targeted limb and muscles were identified. The skin was prepped with alcohol swabs and placement of needle tip in targeted muscle was confirmed using appropriate guidance. Prior to injection, positioning of needle tip outside of blood vessel was determined by pulling back on syringe plunger.  MUSCLE UNITS Right medial gastoc 150U Right lateral gastroc 150U  Total units used: 300 Complications: None Plan: Follow up in 6 weeks  Ankit Karis JubaAnil Patel  9:33 AM

## 2016-09-05 ENCOUNTER — Encounter: Payer: Medicare Other | Admitting: Physical Medicine & Rehabilitation

## 2016-09-10 ENCOUNTER — Encounter: Payer: Self-pay | Admitting: Physical Medicine & Rehabilitation

## 2016-09-10 ENCOUNTER — Encounter: Payer: Medicare Other | Attending: Physical Medicine & Rehabilitation | Admitting: Physical Medicine & Rehabilitation

## 2016-09-10 VITALS — BP 124/85 | HR 72

## 2016-09-10 DIAGNOSIS — Z8673 Personal history of transient ischemic attack (TIA), and cerebral infarction without residual deficits: Secondary | ICD-10-CM | POA: Diagnosis not present

## 2016-09-10 DIAGNOSIS — G8310 Monoplegia of lower limb affecting unspecified side: Secondary | ICD-10-CM | POA: Diagnosis not present

## 2016-09-10 DIAGNOSIS — R269 Unspecified abnormalities of gait and mobility: Secondary | ICD-10-CM | POA: Insufficient documentation

## 2016-09-10 DIAGNOSIS — I69898 Other sequelae of other cerebrovascular disease: Secondary | ICD-10-CM | POA: Diagnosis not present

## 2016-09-10 DIAGNOSIS — I10 Essential (primary) hypertension: Secondary | ICD-10-CM | POA: Diagnosis not present

## 2016-09-10 DIAGNOSIS — E119 Type 2 diabetes mellitus without complications: Secondary | ICD-10-CM | POA: Insufficient documentation

## 2016-09-10 NOTE — Progress Notes (Signed)
Subjective:    Patient ID: Sophia Rodriguez, female    DOB: Jul 28, 1950, 66 y.o.   MRN: [id number]  HPI  66 y/o female with pmh of strokes x3 (most recently ~2014), DM, HTN presents for follow up of spasticity.   Initially stated: The problem is mainly in her right leg.  It causes her difficulty with ambulation.  She uses a rolling walker currently and has not had any falls.  The problems started ~ 5 years ago getting worse over time. The biggest problems seems to be dragging of her foot.  It gets worse at the end of the day.  She has some associated weakness, but denies numbness/tingling.  Previously she was prescribed Baclofen 5, which did not work and 10, which caused her too much drowsiness.  She had Botox, but this was not effective, tizanidine made her too drowsy.   She was last seen in clinic on 07/25/16.  At that time, she received Dysport injection.  She believes she had benefit from the injection, but thinks it started to wear off.    Pain Inventory Average Pain 3 Pain Right Now 1 My pain is tingling  In the last 24 hours, has pain interfered with the following? General activity 5 Relation with others 4 Enjoyment of life 4 What TIME of day is your pain at its worst? evening Sleep (in general) Fair  Pain is worse with: walking Pain improves with: rest Relief from Meds: 8  Mobility walk with assistance use a walker ability to climb steps?  no do you drive?  no  Function disabled: date disabled . I need assistance with the following:  household duties and shopping  Neuro/Psych trouble walking confusion depression  Prior Studies Any changes since last visit?  no  Physicians involved in your care Any changes since last visit?  no   Family History  Problem Relation Age of Onset  . Stroke Father   . Stroke Brother    Social History   Social History  . Marital status: Single    Spouse name: N/A  . Number of children: 2  . Years of education: Grad school    Social History Main Topics  . Smoking status: Former Smoker    Quit date: 05/28/2007  . Smokeless tobacco: Never Used  . Alcohol use No  . Drug use: No  . Sexual activity: Not Asked   Other Topics Concern  . None   Social History Narrative   Lives at home by herself.    Daughter come to help at home and she has a cleaning lady to help twice a week.    Caffeine: Every morning 1-1.5 cups coffee/day, and 12oz tea/day    Right-handed   Past Surgical History:  Procedure Laterality Date  . HIP ARTHROPLASTY Right 11/24/2012   Procedure: RIGHT HIP HEMIARTHROPLASTY;  Surgeon: Verlee RossettiSteven R Norris, MD;  Location: [facility];  Service: Orthopedics;  Laterality: Right;  hemiatroplasty, DePuy Triloc   Past Medical History:  Diagnosis Date  . Arthritis   . Diabetes mellitus   . Hypertension   . Stroke (HCC)    BP 124/85   Pulse 72   SpO2 97%   Opioid Risk Score:   Fall Risk Score:  `1  Depression screen PHQ 2/9  [facility] 2/[facility] 06/12/2016 01/11/2016 11/01/2015 07/13/2015 06/15/2015  Decreased Interest 1 0 0 1 1  Down, Depressed, Hopeless 1 0 0 1 1  PHQ - 2 Score 2 0 0 2 2  Altered  sleeping - - - - 3  Tired, decreased energy - - - - 2  Change in appetite - - - - 0  Feeling bad or failure about yourself  - - - - 1  Trouble concentrating - - - - 3  Moving slowly or fidgety/restless - - - - 2  Suicidal thoughts - - - - 0  PHQ-9 Score - - - - 13  Difficult doing work/chores - - - - Somewhat difficult   Review of Systems  Musculoskeletal: Positive for gait problem.  Neurological: Positive for weakness.  Psychiatric/Behavioral: Positive for dysphoric mood. Memory loss. All other systems reviewed and are negative.     Objective:   Physical Exam HENT: Normocephalic, Atraumatic Eyes: EOMI. No discharge.  Cardio: RRR. No JVD. Pulm: B/l clear to auscultation.  Effort normal. Abd: Soft, BS+ MSK:  Gait improved, did not bring AFO today.   No TTP.    No edema. Neuro:   Sensation  intact to light touch in all LE dermatomes  Strength  4+/5 in RLE myotomes (stable)    5/5 in LLE myotomes MAS: Right plantar flexors: 1/4 Skin: Warm and Dry. Intact.     Assessment & Plan:  66 y/o female with pmh of strokes x3 (most recently ~2014), DM, HTN presents for follow up of spasticity.   1. RLE spasticity s/p stroke  Side effects with Tizanidine, balcofen  Encouraged muscle stretches.    Completed PT, cont HEP  Encouraged presence of daughter.  Cont AFO  Cont  150U Dysport to right lateral gastroc   150U Dysport to left lateral gastroc  Will consider PT referral after injeciton  Will referral to PT  2. Abnormality of gait  Toe walking with RLE, improving  Cont walker for safety  Pt would like to walk with a cane at all times as her goal - referral to PT

## 2016-10-01 ENCOUNTER — Ambulatory Visit: Payer: Medicare Other | Attending: Physical Medicine & Rehabilitation | Admitting: Physical Therapy

## 2016-10-01 DIAGNOSIS — R2681 Unsteadiness on feet: Secondary | ICD-10-CM | POA: Diagnosis present

## 2016-10-01 DIAGNOSIS — I69951 Hemiplegia and hemiparesis following unspecified cerebrovascular disease affecting right dominant side: Secondary | ICD-10-CM | POA: Insufficient documentation

## 2016-10-01 DIAGNOSIS — R2689 Other abnormalities of gait and mobility: Secondary | ICD-10-CM | POA: Insufficient documentation

## 2016-10-01 DIAGNOSIS — M6281 Muscle weakness (generalized): Secondary | ICD-10-CM | POA: Diagnosis present

## 2016-10-02 NOTE — Therapy (Signed)
[facility] [address] [place], [place], [address] Phone: [phone]   Fax:  [phone]  Physical Therapy Evaluation  Patient Details  Name: Sophia Rodriguez MRN: [id number] Date of Birth: 11/08/1950 Referring Provider: Dr. Maryla Morrow  Encounter Date: 10/01/2016      PT End of Session - 10/02/16 2142    Visit Number 1   Number of Visits 9   Date for PT Re-Evaluation 11/01/16   Authorization Type Medicare   Authorization Time Period 7-11- 11-30-16   PT Start Time 0803   PT Stop Time 0846   PT Time Calculation (min) 43 min      Past Medical History:  Diagnosis Date  . Arthritis   . Diabetes mellitus   . Hypertension   . Stroke [facility])     Past Surgical History:  Procedure Laterality Date  . HIP ARTHROPLASTY Right 11/24/2012   Procedure: RIGHT HIP HEMIARTHROPLASTY;  Surgeon: Verlee Rossetti, MD;  Location: [facility];  Service: Orthopedics;  Laterality: Right;  hemiatroplasty, DePuy Triloc    There were no vitals filed for this visit.       Subjective Assessment - 10/02/16 2130    Subjective Pt reports she is returning to PT because she wants to walk with the cane; states she had Dysport injection 6 weeks ago and is scheduled to have another one in August (in R[place])   Pertinent History h/o CVA's x 3 with most recent one in 2014; Type II DM:  s/p Rt hip hemiarthroplasty due to fall (Sept. 2014); spasticity:  DM Type II   Patient Stated Goals be able to walk with the cane "and to use in the community if I can get that far"   Currently in Pain? No/denies            [facility] PT Assessment - 10/02/16 0001      Assessment   Medical Diagnosis Rt hemiparesis due to remote CVA:  gait abnormality   Referring Provider Dr. Maryla Morrow   Onset Date/Surgical Date --  2014 for CVA:  Dysport injection in May 2018 in RLE   Prior Therapy pt had PT at this facility from Nov.-Dec. 2017     Precautions   Precautions Fall      Balance Screen   Has the patient fallen in the past 6 months Yes   How many times? 1  fell OOB due to dizziness   Has the patient had a decrease in activity level because of a fear of falling?  No   Is the patient reluctant to leave their home because of a fear of falling?  No     Home Environment   Living Environment Private residence   Living Arrangements Alone   Available Help at Discharge Personal care attendant  comes 2x/week   Type of Home Apartment   Home Access Level entry   Home Layout One level     Prior Function   Level of Independence Independent with household mobility with device;Requires assistive device for independence;Needs assistance with homemaking;Independent with community mobility with device;Independent with basic ADLs     Transfers   Transfers Sit to Stand   Transfer Cueing UE support required     Ambulation/Gait   Ambulation/Gait Yes   Ambulation/Gait Assistance 5: Supervision   Ambulation/Gait Assistance Details pt wearing AFO on RLE   Ambulation Distance (Feet) 50 Feet   Assistive device Rolling walker   Gait Pattern Decreased stance time - right;Decreased step length -  right;Decreased hip/knee flexion - right;Decreased dorsiflexion - left;Decreased weight shift to right;Lateral trunk lean to left   Ambulation Surface Level;Indoor   Gait velocity 26.66 with RW = 1.23     Standardized Balance Assessment   Standardized Balance Assessment Berg Balance Test;Timed Up and Go Test     Berg Balance Test   Sit to Stand Able to stand  independently using hands   Standing Unsupported Able to stand safely 2 minutes   Sitting with Back Unsupported but Feet Supported on Floor or Stool Able to sit safely and securely 2 minutes   Stand to Sit Sits safely with minimal use of hands   Transfers Able to transfer safely, definite need of hands   Standing Unsupported with Eyes Closed Able to stand 10 seconds with supervision   Standing Ubsupported with Feet  Together Needs help to attain position but able to stand for 30 seconds with feet together   From Standing, Reach Forward with Outstretched Arm Can reach confidently >25 cm (10")   From Standing Position, Pick up Object from Floor Able to pick up shoe, needs supervision   From Standing Position, Turn to Look Behind Over each Shoulder Turn sideways only but maintains balance   Turn 360 Degrees Needs assistance while turning   Standing Unsupported, Alternately Place Feet on Step/Stool Able to complete >2 steps/needs minimal assist   Standing Unsupported, One Foot in Front Able to take small step independently and hold 30 seconds   Standing on One Leg Unable to try or needs assist to prevent fall   Total Score 34     Timed Up and Go Test   Normal TUG (seconds) 29.22  with RW:  30.16 with SPC            Objective measurements completed on examination: See above findings.                       PT Long Term Goals - 10/02/16 2155      PT LONG TERM GOAL #1   Title Pt will be independent with HEP in order to indicate improved functional mobility and decreased balance.  (Target Date: 10-31-16)   Time 4   Period Weeks   Status New     PT LONG TERM GOAL #2   Title Pt will improve BERG balance score to 39/56 in order to indicate decreased fall risk.  10-31-16   Baseline 34/56 on 10-01-16   Time 4   Period Weeks   Status New     PT LONG TERM GOAL #3   Title Pt will improve gait speed from 1.23 ft/sec with RW to >/= 1.8 ft/sec in order to indicate decreased fall risk and improved efficiency of gait. 10-31-16   Baseline 1.23 ft/sec with RW on 10-01-16   Time 4   Period Weeks   Status New     PT LONG TERM GOAL #4   Title Pt will ambulate 115' on flat, even surface with R AFO and SPC with SBA for incr. household accessibility.    10-31-16   Time 4   Period Weeks   Status New     PT LONG TERM GOAL #5   Title Pt will transfer floor to stand with UE support with min assist  for fall recovery as needed.  10-31-16   Time 4   Period Weeks   Status New     Additional Long Term Goals   Additional Long Term Goals Yes  PT LONG TERM GOAL #6   Title Improve TUG score from 30.16 secs with cane to </= 20 secs with cane with SBA to demo improved functional mobility.  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  Pt has had 3 CVA's with most recent one having occurred in 08/20/2012.  Pt presents with spasticity in RUE and RLE, gait deviations and balance deficits.  Pt is wearing an AFO on her RLE.  Pt received PT at this facility in Nov. - Dec.2017 ; she states she is returning to PT due to having received Dysport injection in Aug 21, 2022 and is scheduled to have another one in Aug. 2018 - states she wants to walk with the cane independently.  Pt is of evolving presentation and moderate complexity required in POC,   History and Personal Factors relevant to plan of care: CVA's x 3 with most recent one in 08-20-2012:  pt lives alone; s/p R hip fracture in Aug 20, 2012 due to fall;    Clinical Presentation Evolving   Clinical Presentation due to: Lt CVA with Rt spastic hemiplegia   Clinical Decision Making Moderate   Rehab Potential Fair   Clinical Impairments Affecting Rehab Potential severity of impairments and length of time since CVA (4 yrs); dense Rt hemiplegia   PT Frequency 2x / week   PT Duration 4 weeks   PT Treatment/Interventions ADLs/Self Care Home Management;DME Instruction;Gait training;Stair training;Functional mobility training;Therapeutic activities;Therapeutic exercise;Balance training;Neuromuscular re-education;Patient/family education   PT Next Visit Plan instruct in balance and strengthening for HEP; gait train with SPC   PT Home Exercise Plan balance, strengthening , stretching for  RLE   Consulted and Agree with Plan of Care Patient      Patient will benefit from skilled therapeutic intervention in order to improve the following deficits and impairments:  Abnormal gait, Decreased balance, Decreased coordination, Decreased range of motion, Decreased strength, Impaired tone, Impaired UE functional use, Decreased activity tolerance  Visit Diagnosis: Hemiplegia and hemiparesis following unspecified cerebrovascular disease affecting right dominant side (HCC) - Plan: PT plan of care cert/re-cert  Other abnormalities of gait and mobility - Plan: PT plan of care cert/re-cert  Muscle weakness (generalized) - Plan: PT plan of care cert/re-cert  Unsteadiness on feet - Plan: PT plan of care cert/re-cert      G-Codes - 10-22-2016 08/21/2202    Functional Assessment Tool Used (Outpatient Only) Berg score 34/56:  TUG score 30.16 secs with cane   Functional Limitation Mobility: Walking and moving around   Mobility: Walking and Moving Around Current Status ([id number]) At least 60 percent but less than 80 percent impaired, limited or restricted   Mobility: Walking and Moving Around Goal Status [phone]) At least 40 percent but less than 60 percent impaired, limited or restricted       Problem List Patient Active Problem List   Diagnosis Date Noted  . Muscle spasticity 10/04/2015  . 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 Right hemiparesis (HCC) 05/10/2014  . Abnormality of gait 05/10/2014  . Closed fracture of right hip (HCC) 11/24/2012  . 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  Stroke (HCC)   . Hypertension     Montae Stager, Donavan Burnet, PT 10/02/2016, 10:09 PM  [facility] [address] [place], [place], [address] Phone: [phone]   Fax:  [phone]  Name: Sophia Rodriguez MRN: [id number] Date of Birth: [birth year]

## 2016-10-07 ENCOUNTER — Ambulatory Visit: Payer: Medicare Other | Admitting: Physical Therapy

## 2016-10-07 ENCOUNTER — Encounter: Payer: Self-pay | Admitting: Physical Therapy

## 2016-10-07 DIAGNOSIS — I69951 Hemiplegia and hemiparesis following unspecified cerebrovascular disease affecting right dominant side: Secondary | ICD-10-CM | POA: Diagnosis not present

## 2016-10-07 DIAGNOSIS — M6281 Muscle weakness (generalized): Secondary | ICD-10-CM

## 2016-10-07 DIAGNOSIS — R2681 Unsteadiness on feet: Secondary | ICD-10-CM

## 2016-10-07 DIAGNOSIS — R2689 Other abnormalities of gait and mobility: Secondary | ICD-10-CM

## 2016-10-07 NOTE — Patient Instructions (Addendum)
Feet Apart (Compliant Surface) Varied Arm Positions - Eyes Closed    Stand on compliant surface: on pillows___ with feet shoulder width apart and arms by your side. Place a chair in front for safety and support. Close eyes and visualize upright position. Hold___20_ seconds. Repeat __3__ times per session. Do __2 sessions per day.  Copyright  VHI. All rights reserved.  Feet Apart (Compliant Surface) Head Motion - Eyes Closed    Stand on compliant surface: on pillows______ with feet shoulder width apart.  Place a chair in front for safety and support. Close eyes and move head slowly 1. Side to side, 10 times 2. Up and down, 10 times Repeat _10___ times per session. Do __2__ sessions per day.  SIT to STAND - ONE HAND ASSIST  While seated in a chair, scoot forward towards the edge of the chair. Next, hold on to the arm rest with one hand for support and then raise up to standing. Perform 5-10 times per session. Do 2 sessions a day.  SEATED HAMSTRING STRETCH  While seated, rest your heel on the floor with your knee straight and gently lean forward until a stretch is felt behind your knee/thigh. Hold for 30-60 seconds, perform 3 times, twice a day.   SEATED CALF STRETCH - GASTROC  While sitting, use a towel or other strap looped around your foot. Gently pull your ankle back until a stretch is felt along the back of your lower leg. Maintain your target knee straight the entire time.  Hold for 30-60 seconds, perform 3 times, twice a day.    Copyright  VHI. All rights reserved.

## 2016-10-07 NOTE — Therapy (Signed)
[facility] [address] [place], [place], [address] Phone: [phone]   Fax:  [phone]  Physical Therapy Treatment  Patient Details  Name: Sophia Rodriguez MRN: [id number] Date of Birth: 05-06-1950 Referring Provider: Dr. Maryla Morrow  Encounter Date: 10/07/2016      PT End of Session - 10/07/16 1009    Visit Number 2   Number of Visits 9   Date for PT Re-Evaluation 11/01/16   Authorization Type Medicare   Authorization Time Period 7-11- 11-30-16   PT Start Time [phone]   PT Stop Time 0930   PT Time Calculation (min) 44 min   Equipment Utilized During Treatment Gait belt   Activity Tolerance Patient tolerated treatment well   Behavior During Therapy [facility] for tasks assessed/performed      Past Medical History:  Diagnosis Date  . Arthritis   . Diabetes mellitus   . Hypertension   . Stroke [facility])     Past Surgical History:  Procedure Laterality Date  . HIP ARTHROPLASTY Right 11/24/2012   Procedure: RIGHT HIP HEMIARTHROPLASTY;  Surgeon: Verlee Rossetti, MD;  Location: [facility];  Service: Orthopedics;  Laterality: Right;  hemiatroplasty, DePuy Triloc    There were no vitals filed for this visit.      Subjective Assessment - 10/07/16 0853    Subjective Patient reports some tightness in R calf, R AFO "not feeling right."  No falls reported. She said overall she is feeling good and is looking forward to leaving on Thursday for big family reunion.   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 2014); spasticity:  DM Type II   Patient Stated Goals be able to walk with the cane "and to use in the community if I can get that far"   Currently in Pain? No/denies            [facility] Adult PT Treatment/Exercise - 10/07/16 0936      Transfers   Transfers Sit to Stand;Stand to Sit   Sit to Stand With upper extremity assist   Stand to Sit Without  upper extremity assist   Number of Reps Other reps (comment)  5   Transfer Cueing UE support required for sit<>stand, pt was able to safely sit without UE support   Comments supervision, Pt sequenced well without VC     Ambulation/Gait   Ambulation/Gait Yes   Ambulation/Gait Assistance 5: Supervision   Ambulation/Gait Assistance Details Pt wearing AFO on RLE, VC to increase step length R>L   Ambulation Distance (Feet) 120 Feet   Assistive device Rolling walker   Gait Pattern Decreased stride length;Step-through pattern;Decreased step length - right;Decreased stance time - right;Decreased dorsiflexion - right;Decreased dorsiflexion - left;Decreased weight shift to right;Lateral trunk lean to left   Ambulation Surface Level;Indoor     Neuro Re-ed    Neuro Re-ed Details  Level and compliant surfaces     Exercises   Exercises Other Exercises   Other Exercises  Performed seated hamstring stretch R & L holding 30 seconds, x2 each; performed seated calf stretch with gait belt looped around toes of L shoe holding 60 seconds, x3; not performed on R due to wearing AFO and time, Pt. to perform at home as part of HEP; VC for technique.     Neuro Re-ed:   Level Surface standing outward facing in corner with chair placed in front for safety  and support: --EO, wide base of support, static 30 seconds x1 progressing to head turns EO, R<>L x8, supervision;  --EC, wide base of support, static 30 seconds x2 progressing to head turns R<>L and up<>down x10 each,  --EC narrow base of support, static 20 seconds x2, progressing to head turns R<>L, up <>down x10, VC for weight shifting, min guard, mild LOB x2; no UE support for all trials on level surface except for with LOB x2.  Compliant surface: 2 pillows placed on floor in corner with chair in front for safety and support:  --EO, wide base of support, static 30 seconds x1 progressing to head turns R<>L x10 each, no UE support, min guard, VC for weight  shifting. --SLS on level surface facing counter for bilateral UE support, min guard, alternating 3 second hold x10 each; VC to bear weight firmly on supporting LE; pt. initially cautious about fully weight bearing on weaker RLE barely lifting L foot off floor, but grew confident as she was able to perform R SLS holding LLE up at least 5 inches from floor with min B UE support.          PT Long Term Goals - 10/02/16 2155      PT LONG TERM GOAL #1   Title Pt will be independent with HEP in order to indicate improved functional mobility and decreased balance.  (Target Date: 10-31-16)   Time 4   Period Weeks   Status New     PT LONG TERM GOAL #2   Title Pt will improve BERG balance score to 39/56 in order to indicate decreased fall risk.  10-31-16   Baseline 34/56 on 10-01-16   Time 4   Period Weeks   Status New     PT LONG TERM GOAL #3   Title Pt will improve gait speed from 1.23 ft/sec with RW to >/= 1.8 ft/sec in order to indicate decreased fall risk and improved efficiency of gait. 10-31-16   Baseline 1.23 ft/sec with RW on 10-01-16   Time 4   Period Weeks   Status New     PT LONG TERM GOAL #4   Title Pt will ambulate 115' on flat, even surface with R AFO and SPC with SBA for incr. household accessibility.    10-31-16   Time 4   Period Weeks   Status New     PT LONG TERM GOAL #5   Title Pt will transfer floor to stand with UE support with min assist for fall recovery as needed.  10-31-16   Time 4   Period Weeks   Status New     Additional Long Term Goals   Additional Long Term Goals Yes     PT LONG TERM GOAL #6   Title Improve TUG score from 30.16 secs with cane to </= 20 secs with cane with SBA to demo improved functional mobility.  10-31-16   Baseline 30.16 secs with cane;  29.22 with RW   Time 4   Period Weeks   Status New           Plan - 10/07/16 1239    Clinical Impression Statement Today's skilled session focused on development of patient HEP to improve  functional mobility and balance. Patient performed LE strengthening, flexibility, and balance exercises with accuracy given min verbal and visual cues. Noted marked improved right step length ambulating with walker given verbal cues which was maintained for 75 feet. She verbalized understanding of HEP.  Patient will  benefit from continued skilled PT to progress towards unmet goals.   Clinical Impairments Affecting Rehab Potential severity of impairments and length of time since CVA (4 yrs); dense Rt hemiplegia   PT Frequency 2x / week   PT Duration 4 weeks   PT Treatment/Interventions ADLs/Self Care Home Management;DME Instruction;Gait training;Stair training;Functional mobility training;Therapeutic activities;Therapeutic exercise;Balance training;Neuromuscular re-education;Patient/family education   PT Next Visit Plan gait training with SPC, continue with balance and LE strengthening    PT Home Exercise Plan balance, strengthening , stretching for RLE      Patient will benefit from skilled therapeutic intervention in order to improve the following deficits and impairments:  Abnormal gait, Decreased balance, Decreased coordination, Decreased range of motion, Decreased strength, Impaired tone, Impaired UE functional use, Decreased activity tolerance  Visit Diagnosis: 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 Muscle spasticity 10/04/2015  . 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 Right hemiparesis (HCC) 05/10/2014  . Abnormality of gait 05/10/2014  . Closed fracture of right hip (HCC) 11/24/2012  . 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 Stroke (HCC)   .  Hypertension     Jeanene Erb 10/07/2016, 12:42 PM  [facility] [address] [place], [place], [address] Phone: [phone]   Fax:  [phone]  Name: Sophia Rodriguez MRN: [id number] Date of Birth: [birth year]

## 2016-10-21 ENCOUNTER — Ambulatory Visit: Payer: Medicare Other | Admitting: Physical Therapy

## 2016-10-21 DIAGNOSIS — M6281 Muscle weakness (generalized): Secondary | ICD-10-CM

## 2016-10-21 DIAGNOSIS — I69951 Hemiplegia and hemiparesis following unspecified cerebrovascular disease affecting right dominant side: Secondary | ICD-10-CM | POA: Diagnosis not present

## 2016-10-21 DIAGNOSIS — R2689 Other abnormalities of gait and mobility: Secondary | ICD-10-CM

## 2016-10-21 NOTE — Therapy (Signed)
[facility] [address] [place], [place], [address] Phone: [phone]   Fax:  [phone]  Physical Therapy Treatment  Patient Details  Name: Sophia Rodriguez MRN: [id number] Date of Birth: 09-05-1950 Referring Provider: Dr. Maryla MorrowAnkit Patel  Encounter Date: 10/21/2016      PT End of Session - 10/21/16 2047    Visit Number 3   Number of Visits 9   Date for PT Re-Evaluation 11/07/16  due to pt missing weeks due to out of town   Authorization Type Medicare   Authorization Time Period 7-11- 11-30-16   PT Start Time [phone]   PT Stop Time 0932   PT Time Calculation (min) 45 min   Equipment Utilized During Treatment Gait belt      Past Medical History:  Diagnosis Date  . Arthritis   . Diabetes mellitus   . Hypertension   . Stroke [facility](HCC)     Past Surgical History:  Procedure Laterality Date  . HIP ARTHROPLASTY Right 11/24/2012   Procedure: RIGHT HIP HEMIARTHROPLASTY;  Surgeon: Verlee RossettiSteven R Norris, MD;  Location: [facility];  Service: Orthopedics;  Laterality: Right;  hemiatroplasty, DePuy Triloc    There were no vitals filed for this visit.      Subjective Assessment - 10/21/16 2035    Subjective Pt reports she had good trip to [place] for family reunion; states she has been practicing walking with cane when her PCA is with her during the day   Pertinent History h/o CVA's x 3 with most recent one in 2014; Type II DM:  s/p Rt hip hemiarthroplasty due to fall (Sept. 2014); spasticity:  DM Type II   Patient Stated Goals be able to walk with the cane "and to use in the community if I can get that far"   Currently in Pain? No/denies                         [facility] Adult [facility] Treatment/Exercise - 10/21/16 0858      Transfers   Transfers Sit to Stand;Stand to Sit   Sit to Stand 5: Supervision   Number of Reps Other reps (comment)  5   Comments without UE support     Ambulation/Gait   Ambulation/Gait Yes    Ambulation/Gait Assistance 4: Min guard   Ambulation/Gait Assistance Details cues to increase Rt step length and to place heel down first  AFO worn on RLE   Ambulation Distance (Feet) 250 Feet   Assistive device Straight cane   Gait Pattern Decreased stride length;Step-through pattern;Decreased step length - right;Decreased stance time - right;Decreased dorsiflexion - right;Decreased dorsiflexion - left;Decreased weight shift to right;Lateral trunk lean to left   Ambulation Surface Level;Indoor   Stairs Yes   Stairs Assistance 5: Supervision   Stairs Assistance Details (indicate cue type and reason) step by step sequence   Stair Management Technique Two rails   Number of Stairs 6     Lumbar Exercises: Machines for Strengthening   Leg Press 40# 1 set 15 reps     Lumbar Exercises: Supine   Clam 10 reps  with manual moderate resistance   Bent Knee Raise 10 reps   Bridge 5 reps   Straight Leg Raise 10 reps   Other Supine Lumbar Exercises bridging with hip abdct/adduction x 10 reps   Other Supine Lumbar Exercises Rt 1/2 bridge x 10 reps     Rt hip extension control off side of mat table -  10 reps         Balance Exercises - 10/21/16 2044      Balance Exercises: Standing   Step Ups Forward;6 inch;UE support 2  RLE leading - 10 reps   Sidestepping 2 reps  with UE support inside // bars     Marching in place 10 reps each leg - without UE support (performed inside // bars)  Tap ups to 1st and 2nd step with UE support prn - 10 reps each leg         PT Long Term Goals - 10/02/16 2155      PT LONG TERM GOAL #1   Title Pt will be independent with HEP in order to indicate improved functional mobility and decreased balance.  (Target Date: 10-31-16)   Time 4   Period Weeks   Status New     PT LONG TERM GOAL #2   Title Pt will improve BERG balance score to 39/56 in order to indicate decreased fall risk.  10-31-16   Baseline 34/56 on 10-01-16   Time 4   Period Weeks   Status  New     PT LONG TERM GOAL #3   Title Pt will improve gait speed from 1.23 ft/sec with RW to >/= 1.8 ft/sec in order to indicate decreased fall risk and improved efficiency of gait. 10-31-16   Baseline 1.23 ft/sec with RW on 10-01-16   Time 4   Period Weeks   Status New     PT LONG TERM GOAL #4   Title Pt will ambulate 115' on flat, even surface with R AFO and SPC with SBA for incr. household accessibility.    10-31-16   Time 4   Period Weeks   Status New     PT LONG TERM GOAL #5   Title Pt will transfer floor to stand with UE support with min assist for fall recovery as needed.  10-31-16   Time 4   Period Weeks   Status New     Additional Long Term Goals   Additional Long Term Goals Yes     PT LONG TERM GOAL #6   Title Improve TUG score from 30.16 secs with cane to </= 20 secs with cane with SBA to demo improved functional mobility.  10-31-16   Baseline 30.16 secs with cane;  29.22 with RW   Time 4   Period Weeks   Status New               Plan - 10/21/16 2049    Clinical Impression Statement Pt did well with gait training with use of SPC: continues to have incomplete clearance of RLE in swing phase due to Rt hip flexor and hamstring weakness, but improved clearance noted with cues to increase step length and place heel cown first; pt needed frequent cues to maintain this pattern   Rehab Potential Fair   Clinical Impairments Affecting Rehab Potential severity of impairments and length of time since CVA (4 yrs); dense Rt hemiplegia   PT Frequency 2x / week   PT Duration 4 weeks   PT Treatment/Interventions ADLs/Self Care Home Management;DME Instruction;Gait training;Stair training;Functional mobility training;Therapeutic activities;Therapeutic exercise;Balance training;Neuromuscular re-education;Patient/family education   PT Next Visit Plan gait training with SPC, continue with balance and LE strengthening    PT Home Exercise Plan balance, strengthening , stretching for RLE    Consulted and Agree with Plan of Care Patient      Patient will benefit from skilled therapeutic intervention  in order to improve the following deficits and impairments:  Abnormal gait, Decreased balance, Decreased coordination, Decreased range of motion, Decreased strength, Impaired tone, Impaired UE functional use, Decreased activity tolerance  Visit Diagnosis: Other abnormalities of gait and mobility  Muscle weakness (generalized)     Problem List Patient Active Problem List   Diagnosis Date Noted  . Muscle spasticity 10/04/2015  . 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 Right hemiparesis (HCC) 05/10/2014  . Abnormality of gait 05/10/2014  . Closed fracture of right hip (HCC) 11/24/2012  . 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 Stroke (HCC)   . Hypertension     Kaylenn Civil, Donavan Burnet, PT 10/21/2016, 8:55 PM  [facility] [address] [place], [place], [address] Phone: [phone]   Fax:  [phone]  Name: Siyana Erney MRN: [id number] Date of Birth: 17-Jun-1950

## 2016-10-22 ENCOUNTER — Encounter: Payer: Self-pay | Admitting: Physical Medicine & Rehabilitation

## 2016-10-22 ENCOUNTER — Encounter: Payer: Medicare Other | Attending: Physical Medicine & Rehabilitation | Admitting: Physical Medicine & Rehabilitation

## 2016-10-22 VITALS — BP 126/86 | HR 92 | Resp 14

## 2016-10-22 DIAGNOSIS — I69898 Other sequelae of other cerebrovascular disease: Secondary | ICD-10-CM | POA: Insufficient documentation

## 2016-10-22 DIAGNOSIS — E119 Type 2 diabetes mellitus without complications: Secondary | ICD-10-CM | POA: Diagnosis not present

## 2016-10-22 DIAGNOSIS — G8310 Monoplegia of lower limb affecting unspecified side: Secondary | ICD-10-CM | POA: Diagnosis not present

## 2016-10-22 DIAGNOSIS — I10 Essential (primary) hypertension: Secondary | ICD-10-CM | POA: Diagnosis not present

## 2016-10-22 DIAGNOSIS — Z8673 Personal history of transient ischemic attack (TIA), and cerebral infarction without residual deficits: Secondary | ICD-10-CM | POA: Diagnosis not present

## 2016-10-22 DIAGNOSIS — R269 Unspecified abnormalities of gait and mobility: Secondary | ICD-10-CM | POA: Diagnosis not present

## 2016-10-22 NOTE — Progress Notes (Signed)
Dysport: Procedure Note Patient Name: Sophia Rodriguez DOB: 1950-05-10 MRN: [id number]   Procedure: Botulinum toxin administration Guidance: EMG Diagnosis: Spastic monoplegia Date: 10/22/16 Attending: Maryla MorrowAnkit Shaqueena Mauceri, MD    Informed consent: Risks, benefits & options of the procedure are explained to the patient (and/or family). The patient elects to proceed with procedure. Risks include but are not limited to weakness, respiratory distress, dry mouth, ptosis, antibody formation, worsening of some areas of function. Benefits include decreased abnormal muscle tone, improved hygiene and positioning, decreased skin breakdown and, in some cases, decreased pain. Options include conservative management with oral antispasticity agents, phenol chemodenervation of nerve or at motor nerve branches. More invasive options include intrathecal balcofen adminstration for appropriate candidates. Surgical options may include tendon lengthening or transposition or, rarely, dorsal rhizotomy.   History/Physical Examination: 66 y.o. female with pmh of strokes x3 (most recently ~2014), DM, HTN presents for follow up of spasticity.  mAS: RLE ankle plantarflexors 1/4, more pronounced with ambulation. Previous Treatments: Therapy/Range of motion/Bracing/Medications Indication for guidance: Target active muscules  Procedure: Botulinum toxin was mixed with preservative free saline with a dilution of 0.5cc to 100 units. Targeted limb and muscles were identified. The skin was prepped with alcohol swabs and placement of needle tip in targeted muscle was confirmed using appropriate guidance. Prior to injection, positioning of needle tip outside of blood vessel was determined by pulling back on syringe plunger.  MUSCLE UNITS Right medial gastoc 150U Right lateral gastroc 150U  Total units used: 300 Complications: None Plan: Follow up in 6 weeks  Brigitta Pricer Anil Shiquita Collignon  9:22 AM

## 2016-10-24 ENCOUNTER — Encounter: Payer: Self-pay | Admitting: Physical Therapy

## 2016-10-24 ENCOUNTER — Ambulatory Visit: Payer: Medicare Other | Attending: Physical Medicine & Rehabilitation | Admitting: Physical Therapy

## 2016-10-24 DIAGNOSIS — I69951 Hemiplegia and hemiparesis following unspecified cerebrovascular disease affecting right dominant side: Secondary | ICD-10-CM

## 2016-10-24 DIAGNOSIS — R2681 Unsteadiness on feet: Secondary | ICD-10-CM | POA: Diagnosis present

## 2016-10-24 DIAGNOSIS — M6281 Muscle weakness (generalized): Secondary | ICD-10-CM

## 2016-10-24 DIAGNOSIS — R2689 Other abnormalities of gait and mobility: Secondary | ICD-10-CM | POA: Diagnosis present

## 2016-10-26 NOTE — Therapy (Signed)
[facility] [address] [place], [place], [address] Phone: [phone]   Fax:  [phone]  Physical Therapy Treatment  Patient Details  Name: Sophia Rodriguez MRN: [id number] Date of Birth: [birth year] Referring Provider: Dr. Maryla MorrowAnkit Patel  Encounter Date: 10/24/2016   10/24/16 0854  PT Visits / Re-Eval  Visit Number 4  Number of Visits 9  Date for PT Re-Evaluation 11/07/16 (due to pt missing weeks due to out of town)  Authorization  Authorization Type Medicare  Authorization Time Period 7-11- 11-30-16  PT Time Calculation  PT Start Time [phone]  PT Stop Time 0930  PT Time Calculation (min) 40 min  PT - End of Session  Equipment Utilized During Treatment Gait belt     Past Medical History:  Diagnosis Date  . Arthritis   . Diabetes mellitus   . Hypertension   . Stroke [facility](HCC)     Past Surgical History:  Procedure Laterality Date  . HIP ARTHROPLASTY Right 11/24/2012   Procedure: RIGHT HIP HEMIARTHROPLASTY;  Surgeon: Verlee RossettiSteven R Norris, MD;  Location: [facility];  Service: Orthopedics;  Laterality: Right;  hemiatroplasty, DePuy Triloc    There were no vitals filed for this visit.   10/24/16 0853  Symptoms/Limitations  Subjective No new complaints. No falls or pain to report. HEP is going .  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 2014); spasticity:  DM Type II  Patient Stated Goals be able to walk with the cane "and to use in the community if I can get that far"  Pain Assessment  Currently in Pain? No/denies  Pain Score 0      10/24/16 0855  Transfers  Transfers Sit to Stand;Stand to Sit  Sit to Stand 5: Supervision;With upper extremity assist;From chair/3-in-1;From bed  Stand to Sit 5: Supervision;With upper extremity assist;To bed;To chair/3-in-1  Ambulation/Gait  Ambulation/Gait Yes  Ambulation/Gait Assistance 4: Min guard;4: Min  assist  Ambulation/Gait Assistance Details cues with gait for increased right LE step length and for heel strike. one episode of right toe catching with forward LOB needing min assist to correct balance. added simulated toe cap for second lap with toe's catching once, no LOB this episode. continued to need moderate cues for step length and heel strike with gait.                                 Ambulation Distance (Feet) 250 Feet (x2 reps, 70 x2)  Assistive device Straight cane  Gait Pattern Decreased stride length;Step-through pattern;Decreased step length - right;Decreased stance time - right;Decreased dorsiflexion - right;Decreased dorsiflexion - left;Decreased weight shift to right;Lateral trunk lean to left  Ambulation Surface Level;Indoor  Knee/Hip Exercises: Standing  Forward Step Up Right;1 set;10 reps;Hand Hold: 2;Step Height: 4";Limitations  Lateral Step Up Right;1 set;10 reps;Hand Hold: 2;Step Height: 4";Limitations  Lateral Step Up Limitations cues on form and technique  Forward Step Up Limitations cues on form and technique  Knee/Hip Exercises: Seated  Marching Limitations alternating marching x 10 rep each leg, cues for slow controlled motions  Marching Weights 3 lbs.  Sit to Sand 1 set;with UE support  Other Seated Knee/Hip Exercises sit<>stands (see below for reps) with red band around knees: keeping knees apart to stretch band so to fully engage hip/glutes as well as quads with each rep.  PT Long Term Goals - 10/02/16 2155      PT LONG TERM GOAL #1   Title Pt will be independent with HEP in order to indicate improved functional mobility and decreased balance.  (Target Date: 10-31-16)   Time 4   Period Weeks   Status New     PT LONG TERM GOAL #2   Title Pt will improve Rodriguez balance score to 39/56 in order to indicate decreased fall risk.  10-31-16   Baseline 34/56 on 10-01-16   Time 4   Period Weeks   Status New     PT LONG TERM GOAL #3   Title Pt  will improve gait speed from 1.23 ft/sec with RW to >/= 1.8 ft/sec in order to indicate decreased fall risk and improved efficiency of gait. 10-31-16   Baseline 1.23 ft/sec with RW on 10-01-16   Time 4   Period Weeks   Status New     PT LONG TERM GOAL #4   Title Pt will ambulate 115' on flat, even surface with R AFO and SPC with SBA for incr. household accessibility.    10-31-16   Time 4   Period Weeks   Status New     PT LONG TERM GOAL #5   Title Pt will transfer floor to stand with UE support with min assist for fall recovery as needed.  10-31-16   Time 4   Period Weeks   Status New     Additional Long Term Goals   Additional Long Term Goals Yes     PT LONG TERM GOAL #6   Title Improve TUG score from 30.16 secs with cane to </= 20 secs with cane with SBA to demo improved functional mobility.  10-31-16   Baseline 30.16 secs with cane;  29.22 with RW   Time 4   Period Weeks   Status New        10/24/16 0854  Plan  Clinical Impression Statement Today's skilled session continued to address gait with straight cane and LE strengthening without any issues reported. Pt is making steady progress toward goals and should benefit from continue PT to progress toward unmet goals.  Pt will benefit from skilled therapeutic intervention in order to improve on the following deficits Abnormal gait;Decreased balance;Decreased coordination;Decreased range of motion;Decreased strength;Impaired tone;Impaired UE functional use;Decreased activity tolerance  Rehab Potential Fair  Clinical Impairments Affecting Rehab Potential severity of impairments and length of time since CVA (4 yrs); dense Rt hemiplegia  PT Frequency 2x / week  PT Duration 4 weeks  PT Treatment/Interventions ADLs/Self Care Home Management;DME Instruction;Gait training;Stair training;Functional mobility training;Therapeutic activities;Therapeutic exercise;Balance training;Neuromuscular re-education;Patient/family education  PT Next Visit  Plan begin checking LTGs due 10/31/16  PT Home Exercise Plan balance, strengthening , stretching for RLE  Consulted and Agree with Plan of Care Patient          Patient will benefit from skilled therapeutic intervention in order to improve the following deficits and impairments:  Abnormal gait, Decreased balance, Decreased coordination, Decreased range of motion, Decreased strength, Impaired tone, Impaired UE functional use, Decreased activity tolerance  Visit Diagnosis: Other abnormalities of gait and mobility  Muscle weakness (generalized)  Hemiplegia and hemiparesis following unspecified cerebrovascular disease affecting right dominant side (HCC)  Unsteadiness on feet     Problem List Patient Active Problem List   Diagnosis Date Noted  . Muscle spasticity 10/04/2015  . 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 Right hemiparesis (HCC) 05/10/2014  .  Abnormality of gait 05/10/2014  . Closed fracture of right hip (HCC) 11/24/2012  . 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 Stroke (HCC)   . Hypertension     Sallyanne KusterKathy Dervin Vore, [place], [facility] [address], [address] [place], [place] [address] [phone] 10/26/16, 5:26 PM   Name: Sophia Rodriguez MRN: [id number] Date of Birth: 12-21-1950

## 2016-10-28 ENCOUNTER — Ambulatory Visit: Payer: Medicare Other | Admitting: Physical Therapy

## 2016-10-30 ENCOUNTER — Encounter: Payer: Self-pay | Admitting: Physical Therapy

## 2016-10-30 ENCOUNTER — Ambulatory Visit: Payer: Medicare Other | Admitting: Physical Therapy

## 2016-10-30 DIAGNOSIS — R2689 Other abnormalities of gait and mobility: Secondary | ICD-10-CM | POA: Diagnosis not present

## 2016-10-30 DIAGNOSIS — M6281 Muscle weakness (generalized): Secondary | ICD-10-CM

## 2016-10-30 DIAGNOSIS — R2681 Unsteadiness on feet: Secondary | ICD-10-CM

## 2016-11-01 NOTE — Therapy (Signed)
[facility] [address] [place], [place], [address] Phone: [phone]   Fax:  [phone]  Physical Therapy Treatment  Patient Details  Name: Sophia Rodriguez MRN: [id number] Date of Birth: 16-Apr-1950 Referring Provider: Dr. Maryla MorrowAnkit Patel  Encounter Date: 10/30/2016   10/30/16 1529  PT Visits / Re-Eval  Visit Number 5  Number of Visits 9  Date for PT Re-Evaluation 11/07/16 (due to pt missing weeks due to out of town)  Authorization  Authorization Type Medicare  Authorization Time Period 7-11- 11-30-16  PT Time Calculation  PT Start Time 0848  PT Stop Time 0930  PT Time Calculation (min) 42 min  PT - End of Session  Equipment Utilized During Treatment Gait belt  Activity Tolerance Patient tolerated treatment well  Behavior During Therapy [facility] for tasks assessed/performed     Past Medical History:  Diagnosis Date  . Arthritis   . Diabetes mellitus   . Hypertension   . Stroke [facility](HCC)     Past Surgical History:  Procedure Laterality Date  . HIP ARTHROPLASTY Right 11/24/2012   Procedure: RIGHT HIP HEMIARTHROPLASTY;  Surgeon: Verlee RossettiSteven R Norris, MD;  Location: [facility];  Service: Orthopedics;  Laterality: Right;  hemiatroplasty, DePuy Triloc    There were no vitals filed for this visit.     10/30/16 0852  Symptoms/Limitations  Subjective No new complaints. No falls or pain to report.   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 2014); spasticity:  DM Type II  Patient Stated Goals be able to walk with the cane "and to use in the community if I can get that far"  Pain Assessment  Currently in Pain? No/denies  Pain Score 0      10/30/16 0854  Transfers  Transfers Sit to Stand;Stand to Sit  Sit to Stand 5: Supervision;With upper extremity assist;From chair/3-in-1;From bed  Stand to Sit 5: Supervision;With upper extremity assist;To bed;To  chair/3-in-1  Ambulation/Gait  Ambulation/Gait Yes  Ambulation/Gait Assistance 4: Min guard;4: Min assist  Ambulation/Gait Assistance Details cues needed on sequencing and cane placement. up to min assist for balance due to toe scuffing with gait.   Ambulation Distance (Feet) 450 Feet (x1)  Assistive device Straight cane  Gait Pattern Step-to pattern;Step-through pattern;Decreased stride length;Decreased step length - right;Decreased stance time - right;Decreased dorsiflexion - right;Decreased weight shift to right;Lateral trunk lean to left;Narrow base of support;Poor foot clearance - right  Ambulation Surface Level;Indoor       10/30/16 0916  Balance Exercises: Standing  Standing Eyes Closed Narrow base of support (BOS);Wide (BOA);Foam/compliant surface;Head turns;Other reps (comment);30 secs;Limitations  SLS with Vectors Solid surface;Other reps (comment);Limitations  Balance Exercises: Standing  Standing Eyes Closed Limitations in 1 inch foam with no UE support: progressed from wide base of support to narrow base of support with min guard to min assist for balance: EC no head movements, progressing to EC head movements left<>right, up<>down x 10 reps each.           PT Long Term Goals - 11/01/16 1530      PT LONG TERM GOAL #1   Title Pt will be independent with HEP in order to indicate improved functional mobility and decreased balance.  (Target Date: 11-07-16)   Time 4   Period Weeks   Status New     PT LONG TERM GOAL #2   Title Pt will improve BERG balance  score to 39/56 in order to indicate decreased fall risk.     Baseline 34/56 on 10-01-16   Time 4   Period Weeks   Status New     PT LONG TERM GOAL #3   Title Pt will improve gait speed from 1.23 ft/sec with RW to >/= 1.8 ft/sec in order to indicate decreased fall risk and improved efficiency of gait.    Baseline 1.23 ft/sec with RW on 10-01-16   Time 4   Period Weeks   Status New     PT LONG TERM GOAL #4   Title  Pt will ambulate 115' on flat, even surface with R AFO and SPC with SBA for incr. household accessibility.       Time 4   Period Weeks   Status New     PT LONG TERM GOAL #5   Title Pt will transfer floor to stand with UE support with min assist for fall recovery as needed.     Time 4   Period Weeks   Status New     PT LONG TERM GOAL #6   Title Improve TUG score from 30.16 secs with cane to </= 20 secs with cane with SBA to demo improved functional mobility.    Baseline 30.16 secs with cane;  29.22 with RW   Time 4   Period Weeks   Status New        10/30/16 1529  Plan  Clinical Impression Statement Today's skilled session continued to focus on gait with cane and balance activities. Pt continues to be challenged by complaint surfaces needing increased support for balance. Pt is progressing toward goals and should benefit from continued PT to progress toward unmet goals.   Pt will benefit from skilled therapeutic intervention in order to improve on the following deficits Abnormal gait;Decreased balance;Decreased coordination;Decreased range of motion;Decreased strength;Impaired tone;Impaired UE functional use;Decreased activity tolerance  Rehab Potential Fair  Clinical Impairments Affecting Rehab Potential severity of impairments and length of time since CVA (4 yrs); dense Rt hemiplegia  PT Frequency 2x / week  PT Duration 4 weeks  PT Treatment/Interventions ADLs/Self Care Home Management;DME Instruction;Gait training;Stair training;Functional mobility training;Therapeutic activities;Therapeutic exercise;Balance training;Neuromuscular re-education;Patient/family education  PT Next Visit Plan begin checking LTGs due 11/07/16  PT Home Exercise Plan balance, strengthening , stretching for RLE  Consulted and Agree with Plan of Care Patient          Patient will benefit from skilled therapeutic intervention in order to improve the following deficits and impairments:  Abnormal gait,  Decreased balance, Decreased coordination, Decreased range of motion, Decreased strength, Impaired tone, Impaired UE functional use, Decreased activity tolerance  Visit Diagnosis: Other abnormalities of gait and mobility  Muscle weakness (generalized)  Unsteadiness on feet     Problem List Patient Active Problem List   Diagnosis Date Noted  . Muscle spasticity 10/04/2015  . 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 Right hemiparesis (HCC) 05/10/2014  . Abnormality of gait 05/10/2014  . Closed fracture of right hip (HCC) 11/24/2012  . 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 Stroke (HCC)   . Hypertension     Sallyanne Kuster, [place], [facility] [address], [address] [place], [place] [address] [phone] 11/01/16, 3:31 PM   Name: Sophia Rodriguez MRN: [id number] Date of Birth: 03-Aug-1950

## 2016-11-04 ENCOUNTER — Ambulatory Visit: Payer: Medicare Other | Admitting: Physical Therapy

## 2016-11-04 DIAGNOSIS — M6281 Muscle weakness (generalized): Secondary | ICD-10-CM

## 2016-11-04 DIAGNOSIS — R2689 Other abnormalities of gait and mobility: Secondary | ICD-10-CM | POA: Diagnosis not present

## 2016-11-04 DIAGNOSIS — R2681 Unsteadiness on feet: Secondary | ICD-10-CM

## 2016-11-05 NOTE — Therapy (Signed)
[facility] [address] [place], [place], [address] Phone: [phone]   Fax:  [phone]  Physical Therapy Treatment  Patient Details  Name: Sophia Rodriguez MRN: [id number] Date of Birth: Jan 11, 1951 Referring Provider: Dr. Delice Lesch  Encounter Date: 11/04/2016      PT End of Session - 11/05/16 1601    Visit Number 6   Number of Visits 15   Date for PT Re-Evaluation 12/05/16  renewal completed 11-05-16 for 4 add'l wks   Authorization Type Medicare   Authorization Time Period 11-05-16 - 12-21-16   PT Start Time 1443   PT Stop Time 0930   PT Time Calculation (min) 43 min      Past Medical History:  Diagnosis Date  . Arthritis   . Diabetes mellitus   . Hypertension   . Stroke [facility])     Past Surgical History:  Procedure Laterality Date  . HIP ARTHROPLASTY Right 11/24/2012   Procedure: RIGHT HIP HEMIARTHROPLASTY;  Surgeon: Augustin Schooling, MD;  Location: [facility];  Service: Orthopedics;  Laterality: Right;  hemiatroplasty, DePuy Triloc    There were no vitals filed for this visit.      Subjective Assessment - 11/05/16 1538    Subjective Pt states she would like to continue PT for a few more weeks because she really wants to be able to walk with a cane in her house (without             [facility] PT Assessment - 11/05/16 0001      Berg Balance Test   Sit to Stand Able to stand  independently using hands   Standing Unsupported Able to stand safely 2 minutes   Sitting with Back Unsupported but Feet Supported on Floor or Stool Able to sit safely and securely 2 minutes   Stand to Sit Sits safely with minimal use of hands   Transfers Able to transfer safely, definite need of hands   Standing Unsupported with Eyes Closed Able to stand 10 seconds safely   Standing Ubsupported with Feet Together Able to place feet together independently and stand for 1 minute with supervision   From Standing, Reach Forward with  Outstretched Arm Can reach confidently >25 cm (10")   From Standing Position, Pick up Object from Floor Able to pick up shoe safely and easily   From Standing Position, Turn to Look Behind Over each Shoulder Turn sideways only but maintains balance   Turn 360 Degrees Able to turn 360 degrees safely but slowly   Standing Unsupported, Alternately Place Feet on Step/Stool Able to complete >2 steps/needs minimal assist   Standing Unsupported, One Foot in Front Able to take small step independently and hold 30 seconds   Standing on One Leg Tries to lift leg/unable to hold 3 seconds but remains standing independently   Total Score 41                     [facility] Adult PT Treatment/Exercise - 11/05/16 0001      Ambulation/Gait   Ambulation/Gait Yes   Ambulation/Gait Assistance 5: Supervision   Ambulation Distance (Feet) 125 Feet  x1   Assistive device Straight cane   Gait Pattern Step-to pattern;Step-through pattern;Decreased stride length;Decreased step length - right;Decreased stance time - right;Decreased dorsiflexion - right;Decreased weight shift to right;Lateral trunk lean to left;Narrow base of support;Poor foot clearance - right   Ambulation Surface Level;Indoor   Gait velocity 22.32 secs = 1.47 ft/sec  Standardized Balance Assessment   Standardized Balance Assessment Timed Up and Go Test     Timed Up and Go Test   TUG Normal TUG   Normal TUG (seconds) 26.97  with RW:  25.59 secs with cane     Floor to stand transfer with mod assist with pt pushing up on mat for assistance with transfer                PT Long Term Goals - 11/05/16 1610      PT LONG TERM GOAL #1   Title Independent in updated HEP as appropriate for LE strengthening and balance.     Time 4   Period Weeks   Status New   Target Date 12/05/16     PT LONG TERM GOAL #2   Title Pt will improve BERG balance score to >/= 45/56 in order to indicate decreased fall risk.     Baseline 34/56 on  10-01-16; score 41/56 on 11-04-16   Time 4   Period Weeks   Status Revised   Target Date 12/05/16     PT LONG TERM GOAL #3   Title Pt will improve gait speed from 1.23 ft/sec with RW to >/= 1.8 ft/sec in order to indicate decreased fall risk and improved efficiency of gait.    Baseline 1.23 ft/sec with RW on 10-01-16; 22.31= 1.47 ft/sec with RW   Time 4   Period Weeks   Status On-going   Target Date 12/05/16     PT LONG TERM GOAL #4   Title Pt will ambulate 115' on flat, even surface with R AFO and SPC with rubber quad tip with supervision for incr. household accessibility.       Baseline SPC currently being used -11-04-16   Time 4   Period Weeks   Status Revised   Target Date 12/05/16     PT LONG TERM GOAL #5   Title Pt will transfer floor to stand with UE support with min assist for fall recovery as needed.     Baseline mod assist - 11-04-16   Time 4   Period Weeks   Status On-going   Target Date 12/05/16     PT LONG TERM GOAL #6   Title Improve TUG score from 30.16 secs with cane to </= 21secs with cane with SBA to demo improved functional mobility.   TARGET DATE 12-05-16   Baseline 26.97 secs with RW:  25.59 with cane on 11-04-16   Time 4   Period Weeks   Status Revised               Plan - 11/05/16 1606    Clinical Impression Statement Pt has met LTG's #1,2, and 4: LTG # 3 & 6 partially met due to improvements in scores since initial eval but not fully achieved stated goals:  LTG #5 not met as pt requires mod assist with floor to stand transfer due to LE weakness:  recert completed for 4 additional weeks to address gait training with cane and balance training   Rehab Potential Good   Clinical Impairments Affecting Rehab Potential severity of impairments and length of time since CVA (4 yrs); dense Rt hemiplegia   PT Frequency 2x / week   PT Duration 4 weeks   PT Treatment/Interventions ADLs/Self Care Home Management;DME Instruction;Gait training;Stair  training;Functional mobility training;Therapeutic activities;Therapeutic exercise;Balance training;Neuromuscular re-education;Patient/family education   PT Next Visit Plan recert completed; cont. gait with cane (try rubber quad tip on [facility]) for  increased stability (pt has SPC only)   PT Home Exercise Plan balance, strengthening , stretching for RLE   Consulted and Agree with Plan of Care Patient      Patient will benefit from skilled therapeutic intervention in order to improve the following deficits and impairments:  Abnormal gait, Decreased balance, Decreased coordination, Decreased range of motion, Decreased strength, Impaired tone, Impaired UE functional use, Decreased activity tolerance  Visit Diagnosis: Other abnormalities of gait and mobility - Plan: PT plan of care cert/re-cert  Muscle weakness (generalized) - Plan: PT plan of care cert/re-cert  Unsteadiness on feet - Plan: PT plan of care cert/re-cert     Problem List Patient Active Problem List   Diagnosis Date Noted  . Muscle spasticity 10/04/2015  . 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 Right hemiparesis ([place]) 05/10/2014  . Abnormality of gait 05/10/2014  . Closed fracture of right hip ([place]) 11/24/2012  . Thrombotic cerebral infarction ([place]) 09/23/2012  . DM type 2 causing neurological disease ([place]) 08/26/2012  . Stroke ([place])   . Hypertension     Jhase Creppel, [place], PT 11/05/2016, 5:08 PM  [facility] [address] [place], [place], [address] Phone: [phone]   Fax:  [phone]  Name: Cathy Crounse MRN: [id number] Date of Birth: Jun 23, 1950

## 2016-11-06 ENCOUNTER — Ambulatory Visit: Payer: Medicare Other | Admitting: Rehabilitation

## 2016-11-07 ENCOUNTER — Ambulatory Visit: Payer: Medicare Other | Admitting: Physical Therapy

## 2016-11-07 ENCOUNTER — Encounter: Payer: Self-pay | Admitting: Physical Therapy

## 2016-11-07 DIAGNOSIS — M6281 Muscle weakness (generalized): Secondary | ICD-10-CM

## 2016-11-07 DIAGNOSIS — R2689 Other abnormalities of gait and mobility: Secondary | ICD-10-CM | POA: Diagnosis not present

## 2016-11-07 DIAGNOSIS — R2681 Unsteadiness on feet: Secondary | ICD-10-CM

## 2016-11-07 NOTE — Therapy (Signed)
[facility] [address] [place], [place], [address] Phone: [phone]   Fax:  [phone]  Physical Therapy Treatment  Patient Details  Name: Sophia Rodriguez MRN: [id number] Date of Birth: 08-28-1950 Referring Provider: Dr. Maryla Morrow  Encounter Date: 11/07/2016      PT End of Session - 11/07/16 0953    Visit Number 7   Number of Visits 15   Date for PT Re-Evaluation 12/05/16  renewal completed 11-05-16 for 4 add'l wks   Authorization Type Medicare   Authorization Time Period 11-05-16 - 12-21-16   PT Start Time 0845   PT Stop Time 0930   PT Time Calculation (min) 45 min   Equipment Utilized During Treatment Gait belt   Activity Tolerance Patient tolerated treatment well   Behavior During Therapy [facility] for tasks assessed/performed      Past Medical History:  Diagnosis Date  . Arthritis   . Diabetes mellitus   . Hypertension   . Stroke [facility])     Past Surgical History:  Procedure Laterality Date  . HIP ARTHROPLASTY Right 11/24/2012   Procedure: RIGHT HIP HEMIARTHROPLASTY;  Surgeon: Verlee Rossetti, MD;  Location: [facility];  Service: Orthopedics;  Laterality: Right;  hemiatroplasty, DePuy Triloc    There were no vitals filed for this visit.      Subjective Assessment - 11/07/16 0847    Subjective "Fine" Doing exercises at least 3x/wk (in addition to PT sessions). When discussed, she reports getting a leather toe cap was discussed at some point. "The guy came here. But then I never saw him again. Not sure what happened. "   Patient is accompained by: Family member  grandaughter   Pertinent History h/o CVA's x 3 with most recent one in 2014; Type II DM:  s/p Rt hip hemiarthroplasty due to fall (Sept. 2014); spasticity:  DM Type II   Patient Stated Goals be able to walk with the cane "and to use in the community if I can get that far"   Currently in Pain? No/denies                         [facility]  Adult PT Treatment/Exercise - 11/07/16 0943      Transfers   Transfers Sit to Stand;Stand to Sit   Sit to Stand 4: Min guard;4: Min assist;6: Modified independent (Device/Increase time);With upper extremity assist;Without upper extremity assist;From bed   Sit to Stand Details (indicate cue type and reason) modified independent with RW; minguard with UEs to cane; min assist without use UEs to cane or RW   Stand to Sit 5: Supervision;4: Min assist   Stand to Sit Details as above for sit to stand   Number of Reps 10 reps   Transfer Cueing cues for positioning prior to attempt; very light min assist for anterior wt-shift to get over her BOS     Ambulation/Gait   Ambulation/Gait Yes   Ambulation/Gait Assistance 4: Min guard   Ambulation/Gait Assistance Details AFO on RLE; noted sliding/scuffing Rt foot as walking with RW from lobby; vc for incr step length and heelstrike not successful in eliciting a change; with SPC with quad rubber tip vs triangular tip with simulated toe cap and max cues for step length and toe clearance (improved after pre-gait activities in // bars)   Ambulation Distance (Feet) 120 Feet  120x4   Assistive device Rolling walker;Straight cane   Gait Pattern Step-through pattern;Decreased step length -  right;Decreased step length - left;Decreased stance time - right;Decreased dorsiflexion - right;Right foot flat;Right flexed knee in stance;Lateral trunk lean to right;Poor foot clearance - right   Ambulation Surface Level   Pre-Gait Activities // bars, staggered stance with RLE forward--wt shift backward onto LLE and lift toes on rt (simulate heelstrike), shift forward over RLE with full hip/knee extension and shift back to back LE x 20; followed by walking in // bars with proper step length, toe clearance, and heelstrike x 4 lengths                PT Education - 11/07/16 0951    Education provided Yes   Education Details purpose of leather toe cap on shoe (approximate  cost $40, not covered by insurance); reinforced need to do sit to stand exercise at home and bouts of slow, controlled gait with strong focus on heelstrike/clearing Rt foot   Person(s) Educated Patient   Methods Explanation;Demonstration   Comprehension Verbalized understanding;Returned demonstration;Need further instruction             PT Long Term Goals - 11/05/16 1610      PT LONG TERM GOAL #1   Title Independent in updated HEP as appropriate for LE strengthening and balance.     Time 4   Period Weeks   Status New   Target Date 12/05/16     PT LONG TERM GOAL #2   Title Pt will improve BERG balance score to >/= 45/56 in order to indicate decreased fall risk.     Baseline 34/56 on 10-01-16; score 41/56 on 11-04-16   Time 4   Period Weeks   Status Revised   Target Date 12/05/16     PT LONG TERM GOAL #3   Title Pt will improve gait speed from 1.23 ft/sec with RW to >/= 1.8 ft/sec in order to indicate decreased fall risk and improved efficiency of gait.    Baseline 1.23 ft/sec with RW on 10-01-16; 22.31= 1.47 ft/sec with RW   Time 4   Period Weeks   Status On-going   Target Date 12/05/16     PT LONG TERM GOAL #4   Title Pt will ambulate 115' on flat, even surface with R AFO and SPC with rubber quad tip with supervision for incr. household accessibility.       Baseline SPC currently being used -11-04-16   Time 4   Period Weeks   Status Revised   Target Date 12/05/16     PT LONG TERM GOAL #5   Title Pt will transfer floor to stand with UE support with min assist for fall recovery as needed.     Baseline mod assist - 11-04-16   Time 4   Period Weeks   Status On-going   Target Date 12/05/16     PT LONG TERM GOAL #6   Title Improve TUG score from 30.16 secs with cane to </= 21secs with cane with SBA to demo improved functional mobility.   TARGET DATE 12-05-16   Baseline 26.97 secs with RW:  25.59 with cane on 11-04-16   Time 4   Period Weeks   Status Revised                Plan - 11/07/16 0954    Clinical Impression Statement Session focused on gait training including pre-gait for incr clearance Rt foot and full wt-shift over RLE (with good carryover to gait in// bars, less carryover when walking with RW or cane). Utilized simulated  leather toe cap due to degree of Rt foot scuffing during swing. Pt did not have LOB forward due to poor foot clearance however is at high risk to do so. Will continue to work towards goals. Discuss ?toe cap with primary therapists. (OF note, pt would like to use [facility] who provided her AFO).    Rehab Potential Good   Clinical Impairments Affecting Rehab Potential severity of impairments and length of time since CVA (4 yrs); dense Rt hemiplegia   PT Frequency 2x / week   PT Duration 4 weeks   PT Treatment/Interventions ADLs/Self Care Home Management;DME Instruction;Gait training;Stair training;Functional mobility training;Therapeutic activities;Therapeutic exercise;Balance training;Neuromuscular re-education;Patient/family education   PT Next Visit Plan ? refer to [facility] (pt preference) for Rt leather toe cap (pt interested & knows cost with no insurance coverage); cont. gait with cane (SPC) tried rubber quad tip with poor placement of tip (on 1-2 corners only)   PT Home Exercise Plan balance, strengthening , stretching for RLE   Consulted and Agree with Plan of Care Patient      Patient will benefit from skilled therapeutic intervention in order to improve the following deficits and impairments:  Abnormal gait, Decreased balance, Decreased coordination, Decreased range of motion, Decreased strength, Impaired tone, Impaired UE functional use, Decreased activity tolerance  Visit Diagnosis: Other abnormalities of gait and mobility  Muscle weakness (generalized)  Unsteadiness on feet     Problem List Patient Active Problem List   Diagnosis Date Noted  . Muscle spasticity 10/04/2015  . Monoplegia of lower limb  affecting dominant side (HCC) 08/24/2015  . Right hemiparesis (HCC) 05/10/2014  . Abnormality of gait 05/10/2014  . Closed fracture of right hip (HCC) 11/24/2012  . 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 Stroke (HCC)   . Hypertension     Zena Amos, PT 11/07/2016, 10:00 AM  [facility] [address] [place], [place], [address] Phone: [phone]   Fax:  [phone]  Name: Arissa Fagin MRN: [id number] Date of Birth: Sep 07, 1950

## 2016-11-11 ENCOUNTER — Ambulatory Visit: Payer: Medicare Other | Admitting: Physical Therapy

## 2016-11-11 ENCOUNTER — Encounter: Payer: Self-pay | Admitting: Physical Therapy

## 2016-11-11 DIAGNOSIS — R2681 Unsteadiness on feet: Secondary | ICD-10-CM

## 2016-11-11 DIAGNOSIS — M6281 Muscle weakness (generalized): Secondary | ICD-10-CM

## 2016-11-11 DIAGNOSIS — R2689 Other abnormalities of gait and mobility: Secondary | ICD-10-CM

## 2016-11-12 NOTE — Therapy (Signed)
[facility] [address] [place], [place], [address] Phone: [phone]   Fax:  [phone]  Physical Therapy Treatment  Patient Details  Name: Sophia Rodriguez MRN: [id number] Date of Birth: Nov 26, 1950 Referring Provider: Dr. Maryla Morrow  Encounter Date: 11/11/2016      PT End of Session - 11/11/16 0853    Visit Number 8   Number of Visits 15   Date for PT Re-Evaluation 12/05/16  renewal completed 11-05-16 for 4 add'l wks   Authorization Type Medicare   Authorization Time Period 11-05-16 - 12-21-16   PT Start Time 0847   PT Stop Time 0930   PT Time Calculation (min) 43 min   Equipment Utilized During Treatment Gait belt   Activity Tolerance Patient tolerated treatment well   Behavior During Therapy [facility] for tasks assessed/performed      Past Medical History:  Diagnosis Date  . Arthritis   . Diabetes mellitus   . Hypertension   . Stroke [facility])     Past Surgical History:  Procedure Laterality Date  . HIP ARTHROPLASTY Right 11/24/2012   Procedure: RIGHT HIP HEMIARTHROPLASTY;  Surgeon: Verlee Rossetti, MD;  Location: [facility];  Service: Orthopedics;  Laterality: Right;  hemiatroplasty, DePuy Triloc    There were no vitals filed for this visit.      Subjective Assessment - 11/11/16 0852    Subjective No new complaints. No falls or pain to report.    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 2014); spasticity:  DM Type II   Patient Stated Goals be able to walk with the cane "and to use in the community if I can get that far"   Currently in Pain? No/denies   Pain Score 0-No pain            [facility] Adult PT Treatment/Exercise - 11/11/16 0854      Transfers   Transfers Sit to Stand;Stand to Sit   Sit to Stand 5: Supervision;With upper extremity assist;From chair/3-in-1;From  bed   Stand to Sit 5: Supervision;With upper extremity assist;To bed;To chair/3-in-1     Ambulation/Gait   Ambulation/Gait Yes   Ambulation/Gait Assistance 4: Min guard   Ambulation/Gait Assistance Details use of pt's AFO/simulated toe cap on right side. mod cues needed for increased right heel stike and step length with gait. no balance loss noted with either gait reps.   Ambulation Distance (Feet) 230 Feet  x2 reps   Assistive device Straight cane   Gait Pattern Step-through pattern;Decreased step length - right;Decreased step length - left;Decreased stance time - right;Decreased dorsiflexion - right;Right foot flat;Right flexed knee in stance;Lateral trunk lean to right;Poor foot clearance - right   Ambulation Surface Level;Indoor     Knee/Hip Exercises: Standing   Heel Raises Both;1 set;10 reps;5 seconds;Limitations   Heel Raises Limitations with UE support, cues on hold time and for slow, controlled lowering of heels   Hip Flexion AROM;Stengthening;Right;1 set;10 reps;Knee bent;Limitations   Hip Flexion Limitations with 2# ankle weight, working on high knee marching with PTA hand as target.  single UE support for balance.    Other Standing Knee Exercises with single UE support: heel taps to 6 inch box x 10 reps with emphasis on hip/knee flexion as well.      Knee/Hip Exercises: Supine   Short Arc [employer]  AROM;Strengthening;Right;2 sets;10 reps;Limitations   Short Arc [employer] Limitations with 2# ankle weight, cues on hold time and ex form.    Bridges Limitations bil legs with arms at sides: 5 sec holds, 2 sets of 10 reps performed with cues on hold time and ex form.              PT Long Term Goals - 11/05/16 1610      PT LONG TERM GOAL #1   Title Independent in updated HEP as appropriate for LE strengthening and balance.     Time 4   Period Weeks   Status New   Target Date 12/05/16     PT LONG TERM GOAL #2   Title Pt will improve BERG balance score to >/= 45/56 in  order to indicate decreased fall risk.     Baseline 34/56 on 10-01-16; score 41/56 on 11-04-16   Time 4   Period Weeks   Status Revised   Target Date 12/05/16     PT LONG TERM GOAL #3   Title Pt will improve gait speed from 1.23 ft/sec with RW to >/= 1.8 ft/sec in order to indicate decreased fall risk and improved efficiency of gait.    Baseline 1.23 ft/sec with RW on 10-01-16; 22.31= 1.47 ft/sec with RW   Time 4   Period Weeks   Status On-going   Target Date 12/05/16     PT LONG TERM GOAL #4   Title Pt will ambulate 115' on flat, even surface with R AFO and SPC with rubber quad tip with supervision for incr. household accessibility.       Baseline SPC currently being used -11-04-16   Time 4   Period Weeks   Status Revised   Target Date 12/05/16     PT LONG TERM GOAL #5   Title Pt will transfer floor to stand with UE support with min assist for fall recovery as needed.     Baseline mod assist - 11-04-16   Time 4   Period Weeks   Status On-going   Target Date 12/05/16     PT LONG TERM GOAL #6   Title Improve TUG score from 30.16 secs with cane to </= 21secs with cane with SBA to demo improved functional mobility.   TARGET DATE 12-05-16   Baseline 26.97 secs with RW:  25.59 with cane on 11-04-16   Time 4   Period Weeks   Status Revised               Plan - 11/11/16 0853    Clinical Impression Statement Today's skilled session continued to work on gait with straight cane with use of simulated toe cap. No episodes of foot scuffing/catching noted with either gait reps. Remainder of session continued to address LE strengthening working towards increased hip/knee flexion with gait for increased foot clearance. Pt was also given business card for [facility] to obtain shoe cap at her request. Pt is progressing toward goals and should benefit from continued PT to progress toward unmet goals.                           Rehab Potential Good   Clinical Impairments Affecting Rehab  Potential severity of impairments and length of time since CVA (4 yrs); dense Rt hemiplegia   PT Frequency 2x / week   PT Duration 4 weeks   PT Treatment/Interventions ADLs/Self Care Home Management;DME Instruction;Gait training;Stair training;Functional mobility training;Therapeutic  activities;Therapeutic exercise;Balance training;Neuromuscular re-education;Patient/family education   PT Next Visit Plan cont. gait with cane (SPC), balance and LE strengthening   PT Home Exercise Plan balance, strengthening , stretching for RLE   Consulted and Agree with Plan of Care Patient      Patient will benefit from skilled therapeutic intervention in order to improve the following deficits and impairments:  Abnormal gait, Decreased balance, Decreased coordination, Decreased range of motion, Decreased strength, Impaired tone, Impaired UE functional use, Decreased activity tolerance  Visit Diagnosis: Other abnormalities of gait and mobility  Muscle weakness (generalized)  Unsteadiness on feet     Problem List Patient Active Problem List   Diagnosis Date Noted  . Muscle spasticity 10/04/2015  . 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 Right hemiparesis (HCC) 05/10/2014  . Abnormality of gait 05/10/2014  . Closed fracture of right hip (HCC) 11/24/2012  . 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 Stroke (HCC)   . Hypertension     Sallyanne Kuster, [place], [facility] [address] [place], [place] [address] [phone] 11/12/16, 3:59 PM   Name: Sophia Rodriguez MRN: [id number] Date of Birth: November 15, 1950

## 2016-11-14 ENCOUNTER — Ambulatory Visit: Payer: Medicare Other | Admitting: Physical Therapy

## 2016-11-14 ENCOUNTER — Encounter: Payer: Self-pay | Admitting: Physical Therapy

## 2016-11-14 DIAGNOSIS — I69951 Hemiplegia and hemiparesis following unspecified cerebrovascular disease affecting right dominant side: Secondary | ICD-10-CM

## 2016-11-14 DIAGNOSIS — R2681 Unsteadiness on feet: Secondary | ICD-10-CM

## 2016-11-14 DIAGNOSIS — R2689 Other abnormalities of gait and mobility: Secondary | ICD-10-CM | POA: Diagnosis not present

## 2016-11-14 DIAGNOSIS — M6281 Muscle weakness (generalized): Secondary | ICD-10-CM

## 2016-11-15 NOTE — Therapy (Signed)
[facility] [address] [place], [place], [address] Phone: [phone]   Fax:  [phone]  Physical Therapy Treatment  Patient Details  Name: Sophia Rodriguez MRN: [id number] Date of Birth: [birth year] Referring Provider: Dr. Maryla Morrow  Encounter Date: 11/14/2016      PT End of Session - 11/14/16 0810    Visit Number 9   Number of Visits 15   Date for PT Re-Evaluation 12/05/16  renewal completed 11-05-16 for 4 add'l wks   Authorization Type Medicare   Authorization Time Period 11-05-16 - 12-21-16   PT Start Time 0805   PT Stop Time 0845   PT Time Calculation (min) 40 min   Equipment Utilized During Treatment Gait belt   Activity Tolerance Patient tolerated treatment well   Behavior During Therapy [facility] for tasks assessed/performed      Past Medical History:  Diagnosis Date  . Arthritis   . Diabetes mellitus   . Hypertension   . Stroke [facility])     Past Surgical History:  Procedure Laterality Date  . HIP ARTHROPLASTY Right 11/24/2012   Procedure: RIGHT HIP HEMIARTHROPLASTY;  Surgeon: Verlee Rossetti, MD;  Location: [facility];  Service: Orthopedics;  Laterality: Right;  hemiatroplasty, DePuy Triloc    There were no vitals filed for this visit.      Subjective Assessment - 11/14/16 0809    Subjective No new complaints. No falls or pain to report. Was sore for 1.5 days after last session. Has not called [employer], hopes to call today about getting the toe cap. Saw Dr. Donia Guiles yesterday, started her back on her calcium medication. She will bring the name and dosage at next visit.    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 2014); spasticity:  DM Type II   Patient Stated Goals be able to walk with the cane "and to use in the community if I can get that far"   Currently in Pain? No/denies   Pain Score  0-No pain             [facility] Adult PT Treatment/Exercise - 11/14/16 0811      Transfers   Transfers Sit to Stand;Stand to Sit   Sit to Stand 5: Supervision;With upper extremity assist;From chair/3-in-1;From bed   Stand to Sit 5: Supervision;With upper extremity assist;To bed;To chair/3-in-1     Ambulation/Gait   Ambulation/Gait Yes   Ambulation/Gait Assistance 5: Supervision;4: Min guard   Ambulation/Gait Assistance Details no incidents of toe catching with gait using simulated toe cap on right shoe. once episode of balance loss due to left foot clipping cane due to cane too close, pt able to self correct balance with min guard assist.    Ambulation Distance (Feet) 325 Feet  x2 reps   Assistive device Straight cane   Gait Pattern Step-through pattern;Decreased stride length;Decreased step length - left;Decreased stance time - right;Narrow base of support;Poor foot clearance - right  with simulated toe cap on right   Ambulation Surface Level;Indoor     Knee/Hip Exercises: Standing   Heel Raises Both;1 set;10 reps;5 seconds;Limitations   Heel Raises Limitations with UE support, cues on hold time and for slow, controlled lowering of heels   Other Standing Knee Exercises with single UE support (mod HHA on right): heel taps to 6 inch box x 10 reps with emphasis on  hip/knee flexion as well.      Knee/Hip Exercises: Seated   Other Seated Knee/Hip Exercises sit<>stands (see below for reps) with red band around knees: keeping knees apart to stretch band so to fully engage hip/glutes as well as quads with each rep.    Sit to Sand 1 set;with UE support     Knee/Hip Exercises: Supine   Short Arc Quad Sets AROM;Strengthening;Right;2 sets;10 reps;Limitations   Short Arc Quad Sets Limitations with 2# ankle weight, cues on hold time and for full knee extension     Bridges Limitations bil legs with arms at sides: 5 sec holds, 2 sets of 10 reps performed with cues on hold time and ex form.                PT Long Term Goals - 11/05/16 1610      PT LONG TERM GOAL #1   Title Independent in updated HEP as appropriate for LE strengthening and balance.     Time 4   Period Weeks   Status New   Target Date 12/05/16     PT LONG TERM GOAL #2   Title Pt will improve BERG balance score to >/= 45/56 in order to indicate decreased fall risk.     Baseline 34/56 on 10-01-16; score 41/56 on 11-04-16   Time 4   Period Weeks   Status Revised   Target Date 12/05/16     PT LONG TERM GOAL #3   Title Pt will improve gait speed from 1.23 ft/sec with RW to >/= 1.8 ft/sec in order to indicate decreased fall risk and improved efficiency of gait.    Baseline 1.23 ft/sec with RW on 10-01-16; 22.31= 1.47 ft/sec with RW   Time 4   Period Weeks   Status On-going   Target Date 12/05/16     PT LONG TERM GOAL #4   Title Pt will ambulate 115' on flat, even surface with R AFO and SPC with rubber quad tip with supervision for incr. household accessibility.       Baseline SPC currently being used -11-04-16   Time 4   Period Weeks   Status Revised   Target Date 12/05/16     PT LONG TERM GOAL #5   Title Pt will transfer floor to stand with UE support with min assist for fall recovery as needed.     Baseline mod assist - 11-04-16   Time 4   Period Weeks   Status On-going   Target Date 12/05/16     PT LONG TERM GOAL #6   Title Improve TUG score from 30.16 secs with cane to </= 21secs with cane with SBA to demo improved functional mobility.   TARGET DATE 12-05-16   Baseline 26.97 secs with RW:  25.59 with cane on 11-04-16   Time 4   Period Weeks   Status Revised            Plan - 11/14/16 0810    Clinical Impression Statement Today's skilled session continued to work on gait with cane and simulated toe cap (pt plans to call today to set up apt to get one added to her shoe). Pt able to ambulate further today before needing a seated rest break due to fatigue. Remainder of session continued to  address strengthening and balance with no issues reported. Pt is progressing  toward goals and should benefit from continued PT to progress toward unmet goals.    Rehab Potential Good   Clinical  Impairments Affecting Rehab Potential severity of impairments and length of time since CVA (4 yrs); dense Rt hemiplegia   PT Frequency 2x / week   PT Duration 4 weeks   PT Treatment/Interventions ADLs/Self Care Home Management;DME Instruction;Gait training;Stair training;Functional mobility training;Therapeutic activities;Therapeutic exercise;Balance training;Neuromuscular re-education;Patient/family education   PT Next Visit Plan G-code next visist. cont. 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 Muscle spasticity 10/04/2015  . 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 Right hemiparesis (HCC) 05/10/2014  . Abnormality of gait 05/10/2014  . Closed fracture of right hip (HCC) 11/24/2012  . 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 Stroke (HCC)   . Hypertension     Sallyanne Kuster, [place], [facility] [address], [address] [place], [place]  [address] [phone] 11/15/16, 12:48 PM   Name: Sophia Rodriguez MRN: [id number] Date of Birth: [birth year]

## 2016-11-18 ENCOUNTER — Ambulatory Visit: Payer: Medicare Other | Admitting: Physical Therapy

## 2016-11-18 ENCOUNTER — Encounter: Payer: Self-pay | Admitting: Physical Therapy

## 2016-11-18 DIAGNOSIS — I69951 Hemiplegia and hemiparesis following unspecified cerebrovascular disease affecting right dominant side: Secondary | ICD-10-CM

## 2016-11-18 DIAGNOSIS — R2681 Unsteadiness on feet: Secondary | ICD-10-CM

## 2016-11-18 DIAGNOSIS — M6281 Muscle weakness (generalized): Secondary | ICD-10-CM

## 2016-11-18 DIAGNOSIS — R2689 Other abnormalities of gait and mobility: Secondary | ICD-10-CM | POA: Diagnosis not present

## 2016-11-18 NOTE — Therapy (Signed)
Sophia Rodriguez [address] [place], [place], [address] Phone: [phone]   Fax:  [phone]  Physical Therapy Treatment  Patient Details  Name: Sophia Rodriguez MRN: [id number] Date of Birth: 22-Nov-1950 Referring Provider: Dr. Delice Lesch  Encounter Date: 11/18/2016      PT End of Session - 11/18/16 0852    Visit Number 10   Number of Visits 15   Date for PT Re-Evaluation 12/05/16  renewal completed 11-05-16 for 4 add'l wks   Authorization Type Medicare   Authorization Time Period 11-05-16 - 12-21-16   PT Start Time 0848   PT Stop Time 0930   PT Time Calculation (min) 42 min   Equipment Utilized During Treatment Gait belt   Activity Tolerance Patient tolerated treatment well   Behavior During Therapy [facility] for tasks assessed/performed      Past Medical History:  Diagnosis Date  . Arthritis   . Diabetes mellitus   . Hypertension   . Stroke [facility])     Past Surgical History:  Procedure Laterality Date  . HIP ARTHROPLASTY Right 11/24/2012   Procedure: RIGHT HIP HEMIARTHROPLASTY;  Surgeon: Augustin Schooling, MD;  Location: [facility];  Service: Orthopedics;  Laterality: Right;  hemiatroplasty, DePuy Triloc    There were no vitals filed for this visit.      Subjective Assessment - 11/18/16 0850    Subjective No new complaints. No falls or pain to report. Was sore after last session, just not as sore as the session before. Only need 1 day to recover this time. Goes to [employer] next Wed for toe cap to right shoe.    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 2014); spasticity:  DM Type II   Patient Stated Goals be able to walk with the cane "and to use in the community if I can get that far"   Currently in Pain? No/denies            [facility] PT Assessment - 11/18/16 0852      Berg Balance Test   Sit to Stand Able to stand without using hands and  stabilize independently   Standing Unsupported Able to stand safely 2 minutes   Sitting with Back Unsupported but Feet Supported on Floor or Stool Able to sit safely and securely 2 minutes   Stand to Sit Sits safely with minimal use of hands   Transfers Able to transfer safely, definite need of hands   Standing Unsupported with Eyes Closed Able to stand 10 seconds safely   Standing Ubsupported with Feet Together Able to place feet together independently and stand for 1 minute with supervision   From Standing, Reach Forward with Outstretched Arm Can reach confidently >25 cm (10")   From Standing Position, Pick up Object from Floor Able to pick up shoe safely and easily   From Standing Position, Turn to Look Behind Over each Shoulder Looks behind one side only/other side shows less weight shift  right > left   Turn 360 Degrees Able to turn 360 degrees safely but slowly  > 10 sec's both ways   Standing Unsupported, Alternately Place Feet on Step/Stool Able to stand independently and complete 8 steps >20 seconds  > 20 secs   Standing Unsupported, One Foot in Front Able to take small step independently and hold 30 seconds   Standing on One Leg Able to lift leg  independently and hold equal to or more than 3 seconds   Total Score 46     Timed Up and Go Test   Normal TUG (seconds) 27.38  with RW; 28.59 with cane/sim toe cap on right             [facility] Adult PT Treatment/Exercise - 11/18/16 0920      Ambulation/Gait   Ambulation/Gait Yes   Ambulation/Gait Assistance 5: Supervision;4: Min guard   Ambulation/Gait Assistance Details improved right foot advacement as simulated toe cap was used.    Ambulation Distance (Feet) 420 Feet   Assistive device Straight cane  with rubber quad tip   Gait Pattern Step-through pattern;Decreased stride length;Decreased step length - left;Decreased stance time - right;Narrow base of support;Poor foot clearance - right   Ambulation Surface Level;Indoor      Knee/Hip Exercises: Seated   Long Arc Quad AROM;Strengthening;Right;1 set;10 reps;Weights   Long Arc Quad Weight 3 lbs.   Long [employer] Limitations 5 sec holds each rep with cues on form/technique   Other Seated Knee/Hip Exercises sit<>stands (see below for reps) with red band around knees: keeping knees apart to stretch band so to fully engage hip/glutes as well as quads with each rep.    Sit to Sand 1 set;with UE support             PT Long Term Goals - 11/05/16 1610      PT LONG TERM GOAL #1   Title Independent in updated HEP as appropriate for LE strengthening and balance.     Time 4   Period Weeks   Status New   Target Date 12/05/16     PT LONG TERM GOAL #2   Title Pt will improve BERG balance score to >/= 45/56 in order to indicate decreased fall risk.     Baseline 34/56 on 10-01-16; score 41/56 on 11-04-16   Time 4   Period Weeks   Status Revised   Target Date 12/05/16     PT LONG TERM GOAL #3   Title Pt will improve gait speed from 1.23 ft/sec with RW to >/= 1.8 ft/sec in order to indicate decreased fall risk and improved efficiency of gait.    Baseline 1.23 ft/sec with RW on 10-01-16; 22.31= 1.47 ft/sec with RW   Time 4   Period Weeks   Status On-going   Target Date 12/05/16     PT LONG TERM GOAL #4   Title Pt will ambulate 115' on flat, even surface with R AFO and SPC with rubber quad tip with supervision for incr. household accessibility.       Baseline SPC currently being used -11-04-16   Time 4   Period Weeks   Status Revised   Target Date 12/05/16     PT LONG TERM GOAL #5   Title Pt will transfer floor to stand with UE support with min assist for fall recovery as needed.     Baseline mod assist - 11-04-16   Time 4   Period Weeks   Status On-going   Target Date 12/05/16     PT LONG TERM GOAL #6   Title Improve TUG score from 30.16 secs with cane to </= 21secs with cane with SBA to demo improved functional mobility.   TARGET DATE 12-05-16   Baseline  26.97 secs with RW:  25.59 with cane on 11-04-16   Time 4   Period Weeks   Status Revised  Plan - 07-Dec-2016 0852    Clinical Impression Statement Today's skilled session initially addressed functional tests needed for today's g-code with pt improving both in Berg Balance test and TUG scores. LTG for [employer] test met today. Remainder of session addressed gait with cane and right LE strengthening. Pt is progressing toward goals and should benefit from continued PT to progress toward unmet goals.    Rehab Potential Good   Clinical Impairments Affecting Rehab Potential severity of impairments and length of time since CVA (4 yrs); dense Rt hemiplegia   PT Frequency 2x / week   PT Duration 4 weeks   PT Treatment/Interventions ADLs/Self Care Home Management;DME Instruction;Gait training;Stair training;Functional mobility training;Therapeutic activities;Therapeutic exercise;Balance training;Neuromuscular re-education;Patient/family education   PT Next Visit Plan cont. 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 Muscle spasticity 10/04/2015  . 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 Right hemiparesis ([place]) 05/10/2014  . Abnormality of gait 05/10/2014  . Closed fracture of right hip ([place]) 11/24/2012  . 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 Stroke ([place])   . Hypertension     Physical Therapy Progress Note  Dates of Reporting Period:  10-01-16 to 07-Dec-2016  Objective Reports of Subjective Statement: Berg score 46/56:  TUG score 28.59 secs with cane  Objective Measurements: See above for progress towards goals:  Gait velocity 1.47 ft/sec with RW  Goal Update: See above - Pt has met LTG's #1-3:  Goals revised for used of cane rather than RW for assistance with household ambulation  Plan: See above for treatment plan and interventions  Reason Skilled Services are Required: continued RLE weakness; dependency with gait with pt using RW:  Decreased standing balance    Above G codes and progress note completed by Guido Sander, PT [place], [place], PT 11/19/16, 4:51 PM   Note competed by Willow Ora, PTA, [place] [address], [place], [place] [address] [phone] 11/19/16, 4:40 PM   Name: Lisha Vitale MRN: [id number] Date of Birth: Aug 25, 1950

## 2016-11-20 ENCOUNTER — Encounter: Payer: Self-pay | Admitting: Physical Therapy

## 2016-11-20 ENCOUNTER — Ambulatory Visit: Payer: Medicare Other | Admitting: Physical Therapy

## 2016-11-20 DIAGNOSIS — R2689 Other abnormalities of gait and mobility: Secondary | ICD-10-CM

## 2016-11-20 DIAGNOSIS — R2681 Unsteadiness on feet: Secondary | ICD-10-CM

## 2016-11-20 DIAGNOSIS — M6281 Muscle weakness (generalized): Secondary | ICD-10-CM

## 2016-11-21 NOTE — Therapy (Signed)
[facility] [address] [place], [place], [address] Phone: [phone]   Fax:  [phone]  Physical Therapy Treatment  Patient Details  Name: Sophia Rodriguez MRN: [id number] Date of Birth: December 11, 1950 Referring Provider: Dr. Maryla MorrowAnkit Patel  Encounter Date: 11/20/2016      PT End of Session - 11/20/16 0855    Visit Number 11   Number of Visits 15   Date for PT Re-Evaluation 12/05/16  renewal completed 11-05-16 for 4 add'l wks   Authorization Type Medicare   Authorization Time Period 11-05-16 - 12-21-16   PT Start Time 0849   PT Stop Time 0932   PT Time Calculation (min) 43 min   Equipment Utilized During Treatment Gait belt   Activity Tolerance Patient tolerated treatment well   Behavior During Therapy [facility] for tasks assessed/performed      Past Medical History:  Diagnosis Date  . Arthritis   . Diabetes mellitus   . Hypertension   . Stroke [facility](HCC)     Past Surgical History:  Procedure Laterality Date  . HIP ARTHROPLASTY Right 11/24/2012   Procedure: RIGHT HIP HEMIARTHROPLASTY;  Surgeon: Verlee RossettiSteven R Norris, MD;  Location: [facility];  Service: Orthopedics;  Laterality: Right;  hemiatroplasty, DePuy Triloc    There were no vitals filed for this visit.      Subjective Assessment - 11/20/16 0854    Subjective No new complaints. No falls or pain to report.    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 2014); spasticity:  DM Type II   Patient Stated Goals be able to walk with the cane "and to use in the community if I can get that far"   Currently in Pain? No/denies   Pain Score 0-No pain            [facility] Adult PT Treatment/Exercise - 11/20/16 0856      Transfers   Transfers Sit to Stand;Stand to Sit   Sit to Stand 5: Supervision;With upper extremity assist;From chair/3-in-1;From bed   Stand to Sit 5: Supervision;With upper extremity  assist;To bed;To chair/3-in-1     Ambulation/Gait   Ambulation/Gait Yes   Ambulation/Gait Assistance 5: Supervision;4: Min assist   Ambulation/Gait Assistance Details cues on posture, for increased hip/knee flexion on right and for equal step length with gait. loss of  balance on 1st lap due to left toe catching needing min assist to recover, otherwise min guard assist for balance.    Ambulation Distance (Feet) 450 Feet  x1, 250 x1   Assistive device Straight cane  with rubber quad tip   Gait Pattern Step-through pattern;Decreased stride length;Decreased step length - left;Decreased stance time - right;Narrow base of support;Poor foot clearance - right   Ambulation Surface Level;Indoor             Balance Exercises - 11/20/16 0937      Balance Exercises: Standing   Rockerboard Anterior/posterior;Lateral;Head turns;EO;EC;30 seconds;10 reps     Balance Exercises: Standing   [employer] Limitations performed both ways on balance board: EO rocking board with emphasis on tall posture and weight shifting; holding board steady: EC no head movements, progressing to EC head movements left<>right and up<>down x 10 each. min assist with intermittent UE support, cues on posture and weight shifting to assist with balance.  PT Long Term Goals - 11/05/16 1610      PT LONG TERM GOAL #1   Title Independent in updated HEP as appropriate for LE strengthening and balance.     Time 4   Period Weeks   Status New   Target Date 12/05/16     PT LONG TERM GOAL #2   Title Pt will improve BERG balance score to >/= 45/56 in order to indicate decreased fall risk.     Baseline 34/56 on 10-01-16; score 41/56 on 11-04-16   Time 4   Period Weeks   Status Revised   Target Date 12/05/16     PT LONG TERM GOAL #3   Title Pt will improve gait speed from 1.23 ft/sec with RW to >/= 1.8 ft/sec in order to indicate decreased fall risk and improved efficiency of gait.     Baseline 1.23 ft/sec with RW on 10-01-16; 22.31= 1.47 ft/sec with RW   Time 4   Period Weeks   Status On-going   Target Date 12/05/16     PT LONG TERM GOAL #4   Title Pt will ambulate 115' on flat, even surface with R AFO and SPC with rubber quad tip with supervision for incr. household accessibility.       Baseline SPC currently being used -11-04-16   Time 4   Period Weeks   Status Revised   Target Date 12/05/16     PT LONG TERM GOAL #5   Title Pt will transfer floor to stand with UE support with min assist for fall recovery as needed.     Baseline mod assist - 11-04-16   Time 4   Period Weeks   Status On-going   Target Date 12/05/16     PT LONG TERM GOAL #6   Title Improve TUG score from 30.16 secs with cane to </= 21secs with cane with SBA to demo improved functional mobility.   TARGET DATE 12-05-16   Baseline 26.97 secs with RW:  25.59 with cane on 11-04-16   Time 4   Period Weeks   Status Revised            Plan - 11/20/16 0855    Clinical Impression Statement Today's skilled session addressed gait with cane/simulated toe cap and balance reactions without any significant issues. Pt did have one balance loss with gait due to left toe catching, not right. Pt is making progress toward goals and should benefit from continued PT to progress toward unmet goals.    Rehab Potential Good   Clinical Impairments Affecting Rehab Potential severity of impairments and length of time since CVA (4 yrs); dense Rt hemiplegia   PT Frequency 2x / week   PT Duration 4 weeks   PT Treatment/Interventions ADLs/Self Care Home Management;DME Instruction;Gait training;Stair training;Functional mobility training;Therapeutic activities;Therapeutic exercise;Balance training;Neuromuscular re-education;Patient/family education   PT Next Visit Plan cont. gait with cane (SPC), balance and LE strengthening   PT Home Exercise Plan balance, strengthening , stretching for RLE   Consulted and Agree with Plan of  Care Patient      Patient will benefit from skilled therapeutic intervention in order to improve the following deficits and impairments:  Abnormal gait, Decreased balance, Decreased coordination, Decreased range of motion, Decreased strength, Impaired tone, Impaired UE functional use, Decreased activity tolerance  Visit Diagnosis: Other abnormalities of gait and mobility  Muscle weakness (generalized)  Unsteadiness on feet     Problem List Patient Active Problem List   Diagnosis Date Noted  .  Muscle spasticity 10/04/2015  . 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 Right hemiparesis (HCC) 05/10/2014  . Abnormality of gait 05/10/2014  . Closed fracture of right hip (HCC) 11/24/2012  . 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 Stroke (HCC)   . Hypertension     Sallyanne Kuster, [place], [facility] [address], [address] [place], [place] [address] [phone] 11/21/16, 3:52 PM   Name: Sophia Rodriguez MRN: [id number] Date of Birth: [birth year]

## 2016-11-25 ENCOUNTER — Ambulatory Visit: Payer: Medicare Other | Attending: Physical Medicine & Rehabilitation | Admitting: Physical Therapy

## 2016-11-25 ENCOUNTER — Encounter: Payer: Self-pay | Admitting: Physical Therapy

## 2016-11-25 DIAGNOSIS — I69951 Hemiplegia and hemiparesis following unspecified cerebrovascular disease affecting right dominant side: Secondary | ICD-10-CM | POA: Diagnosis present

## 2016-11-25 DIAGNOSIS — M6281 Muscle weakness (generalized): Secondary | ICD-10-CM | POA: Insufficient documentation

## 2016-11-25 DIAGNOSIS — R2681 Unsteadiness on feet: Secondary | ICD-10-CM | POA: Diagnosis present

## 2016-11-25 DIAGNOSIS — R2689 Other abnormalities of gait and mobility: Secondary | ICD-10-CM | POA: Diagnosis present

## 2016-11-25 NOTE — Therapy (Signed)
[facility] [address] [place], [place], [address] Phone: [phone]   Fax:  [phone]  Physical Therapy Treatment  Patient Details  Name: Sophia Rodriguez MRN: [id number] Date of Birth: [birth year] Referring Provider: Dr. Maryla Morrow  Encounter Date: 11/25/2016      PT End of Session - 11/25/16 0810    Visit Number 12   Number of Visits 15   Date for PT Re-Evaluation 12/05/16  renewal completed 11-05-16 for 4 add'l wks   Authorization Type Medicare   Authorization Time Period 11-05-16 - 12-21-16   PT Start Time 0805   PT Stop Time 0841  left early due to not feeling well.    PT Time Calculation (min) 36 min   Equipment Utilized During Treatment Gait belt   Activity Tolerance Patient tolerated treatment well   Behavior During Therapy [facility] for tasks assessed/performed      Past Medical History:  Diagnosis Date  . Arthritis   . Diabetes mellitus   . Hypertension   . Stroke [facility])     Past Surgical History:  Procedure Laterality Date  . HIP ARTHROPLASTY Right 11/24/2012   Procedure: RIGHT HIP HEMIARTHROPLASTY;  Surgeon: Verlee Rossetti, MD;  Location: [facility];  Service: Orthopedics;  Laterality: Right;  hemiatroplasty, DePuy Triloc    There were no vitals filed for this visit.      Subjective Assessment - 11/25/16 0809    Subjective No falls or pain to report. Does report not feeling well today, started feeling nauseated on bus ride here.    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 2014); spasticity:  DM Type II   Patient Stated Goals be able to walk with the cane "and to use in the community if I can get that far"   Currently in Pain? No/denies   Pain Score 0-No pain             [facility] Adult PT Treatment/Exercise - 11/25/16 0810      Transfers   Transfers Sit to Stand;Stand to Sit   Sit to Stand 5: Supervision;With upper  extremity assist;From chair/3-in-1;From bed   Stand to Sit 5: Supervision;With upper extremity assist;To bed;To chair/3-in-1     Ambulation/Gait   Ambulation/Gait Yes   Ambulation/Gait Assistance 5: Supervision   Ambulation/Gait Assistance Details pt needing 2 standing rest breaks with first gait lap due to fatigue today. no balance loss with either gait trial today.   Ambulation Distance (Feet) 220 Feet  x1, 115 x1   Assistive device Straight cane  with rubber quad tip, simulated toe cap on right   Gait Pattern Step-through pattern;Decreased stride length;Decreased step length - left;Decreased stance time - right;Narrow base of support;Poor foot clearance - right   Ambulation Surface Level;Indoor       Knee/Hip Exercises: Seated   Long Arc Quad AROM;Strengthening;Right;10 reps;Weights;2 sets;Limitations   Long Arc Quad Weight 2 lbs.   Long [employer] Limitations 5 sec holds each rep with cues on form/technique   Marching Limitations right leg only with 2# ankle weight, 2 sets of 10 reps with cues on  form and technique              PT Long Term Goals - 11/05/16 1610      PT LONG TERM GOAL #1   Title Independent in updated HEP as appropriate for LE strengthening and balance.  Time 4   Period Weeks   Status New   Target Date 12/05/16     PT LONG TERM GOAL #2   Title Pt will improve BERG balance score to >/= 45/56 in order to indicate decreased fall risk.     Baseline 34/56 on 10-01-16; score 41/56 on 11-04-16   Time 4   Period Weeks   Status Revised   Target Date 12/05/16     PT LONG TERM GOAL #3   Title Pt will improve gait speed from 1.23 ft/sec with RW to >/= 1.8 ft/sec in order to indicate decreased fall risk and improved efficiency of gait.    Baseline 1.23 ft/sec with RW on 10-01-16; 22.31= 1.47 ft/sec with RW   Time 4   Period Weeks   Status On-going   Target Date 12/05/16     PT LONG TERM GOAL #4   Title Pt will ambulate 115' on flat, even surface with R  AFO and SPC with rubber quad tip with supervision for incr. household accessibility.       Baseline SPC currently being used -11-04-16   Time 4   Period Weeks   Status Revised   Target Date 12/05/16     PT LONG TERM GOAL #5   Title Pt will transfer floor to stand with UE support with min assist for fall recovery as needed.     Baseline mod assist - 11-04-16   Time 4   Period Weeks   Status On-going   Target Date 12/05/16     PT LONG TERM GOAL #6   Title Improve TUG score from 30.16 secs with cane to </= 21secs with cane with SBA to demo improved functional mobility.   TARGET DATE 12-05-16   Baseline 26.97 secs with RW:  25.59 with cane on 11-04-16   Time 4   Period Weeks   Status Revised          Plan - 11/25/16 0810    Clinical Impression Statement Pt limited by fatigue and not feeling well today. No balance issues noted with gait today. Pt is progressing toward goals and set to get toe cap later this week. Pt should benefit from continued PT to progress toward unmet goals.    Rehab Potential Good   Clinical Impairments Affecting Rehab Potential severity of impairments and length of time since CVA (4 yrs); dense Rt hemiplegia   PT Frequency 2x / week   PT Duration 4 weeks   PT Treatment/Interventions ADLs/Self Care Home Management;DME Instruction;Gait training;Stair training;Functional mobility training;Therapeutic activities;Therapeutic exercise;Balance training;Neuromuscular re-education;Patient/family education   PT Next Visit Plan cont. gait with cane (SPC), balance and LE strengthening   PT Home Exercise Plan balance, strengthening , stretching for RLE   Consulted and Agree with Plan of Care Patient      Patient will benefit from skilled therapeutic intervention in order to improve the following deficits and impairments:  Abnormal gait, Decreased balance, Decreased coordination, Decreased range of motion, Decreased strength, Impaired tone, Impaired UE functional use,  Decreased activity tolerance  Visit Diagnosis: Other abnormalities of gait and mobility  Muscle weakness (generalized)  Unsteadiness on feet  Hemiplegia and hemiparesis following unspecified cerebrovascular disease affecting right dominant side [facility])     Problem List Patient Active Problem List   Diagnosis Date Noted  . Muscle spasticity 10/04/2015  . 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 Right hemiparesis (HCC) 05/10/2014  . Abnormality of gait 05/10/2014  . Closed fracture  of right hip (HCC) 11/24/2012  . 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 Stroke (HCC)   . Hypertension     Sallyanne KusterKathy Bury, [place], [facility] [address], [address] [place], [place] [address] [phone] 11/25/16, 12:54 PM   Name: Sophia Rodriguez MRN: [id number] Date of Birth: April 08, 1950

## 2016-11-27 ENCOUNTER — Ambulatory Visit: Payer: Medicare Other | Admitting: Physical Therapy

## 2016-11-27 ENCOUNTER — Encounter: Payer: Self-pay | Admitting: Physical Therapy

## 2016-11-27 DIAGNOSIS — R2689 Other abnormalities of gait and mobility: Secondary | ICD-10-CM

## 2016-11-27 DIAGNOSIS — I69951 Hemiplegia and hemiparesis following unspecified cerebrovascular disease affecting right dominant side: Secondary | ICD-10-CM

## 2016-11-27 DIAGNOSIS — R2681 Unsteadiness on feet: Secondary | ICD-10-CM

## 2016-11-27 DIAGNOSIS — M6281 Muscle weakness (generalized): Secondary | ICD-10-CM

## 2016-11-27 NOTE — Therapy (Signed)
[facility] [address] [place], [place], [address] Phone: [phone]   Fax:  [phone]  Physical Therapy Treatment  Patient Details  Name: Sophia Rodriguez MRN: [id number] Date of Birth: [birth year] Referring Provider: Dr. Maryla Morrow  Encounter Date: 11/27/2016      PT End of Session - 11/27/16 0853    Visit Number 13   Number of Visits 15   Date for PT Re-Evaluation 12/05/16  renewal completed 11-05-16 for 4 add'l wks   Authorization Type Medicare   Authorization Time Period 11-05-16 - 12-21-16   PT Start Time 0848   PT Stop Time 0928   PT Time Calculation (min) 40 min   Equipment Utilized During Treatment Gait belt   Activity Tolerance Patient tolerated treatment well   Behavior During Therapy [facility] for tasks assessed/performed      Past Medical History:  Diagnosis Date  . Arthritis   . Diabetes mellitus   . Hypertension   . Stroke [facility])     Past Surgical History:  Procedure Laterality Date  . HIP ARTHROPLASTY Right 11/24/2012   Procedure: RIGHT HIP HEMIARTHROPLASTY;  Surgeon: Verlee Rossetti, MD;  Location: [facility];  Service: Orthopedics;  Laterality: Right;  hemiatroplasty, DePuy Triloc    There were no vitals filed for this visit.      Subjective Assessment - 11/27/16 0852    Subjective Feeling better today. Does report she was sore for up to 2 days after exercises from last session, mostly in right knee. Better today. No falls. Was not able to get her toe cap yesterday as SCAT was an hour late and she had to cancel. Rescheduled for tomorrow.    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 2014); spasticity:  DM Type II   Patient Stated Goals be able to walk with the cane "and to use in the community if I can get that far"   Currently in Pain? No/denies   Pain Score 0-No pain           [facility] Adult PT Treatment/Exercise -  11/27/16 0854      Transfers   Transfers Sit to Stand;Stand to Sit   Sit to Stand 5: Supervision;With upper extremity assist;From chair/3-in-1;From bed   Stand to Sit 5: Supervision;With upper extremity assist;To bed;To chair/3-in-1     Ambulation/Gait   Ambulation/Gait Yes   Ambulation/Gait Assistance 5: Supervision;4: Min assist;4: Min guard   Ambulation/Gait Assistance Details increased assistance as distance progressed with one episode of min assist due to foot scuffing with fwd loss of balance, pt assisted in recovery. cues needed for increased right hip/knee flexion with swing phase for improved foot clearance with swing phase as well.    Ambulation Distance (Feet) 450 Feet  x1   Assistive device Straight cane  with rubber quad tip   Gait Pattern Step-through pattern;Decreased stride length;Decreased step length - left;Decreased stance time - right;Narrow base of support;Poor foot clearance - right   Ambulation Surface Level;Indoor     Lumbar Exercises: Machines for Strengthening   Leg Press 40# bil legs with 5 sec holds x 10 reps; 20# right leg only 5 sec holds x 10 reps with guarding at knee to ensure no hyperextension occured with extension and slow, controlled return to flexion.      Knee/Hip Exercises: Seated   Long Arc Quad AROM;Strengthening;Right;1 set;10 reps;Weights;Limitations   Long Arc  Quad Weight 1 lbs.   Long [employer] Quad Limitations 3 sec hold each rep, not full range so to not aggrivate knee.    Other Seated Knee/Hip Exercises sit<>stands with blue/green textured ball squeeze x 5 reps, use of UE's to assist with standing and controll descent with sitting. 2 sets performed.     Marching Limitations right leg only with 1# ankle weight, 1 sets of 10 reps with cues on  form and technique   Marching Weights 1 lbs.              PT Long Term Goals - 11/05/16 1610      PT LONG TERM GOAL #1   Title Independent in updated HEP as appropriate for LE strengthening and  balance.     Time 4   Period Weeks   Status New   Target Date 12/05/16     PT LONG TERM GOAL #2   Title Pt will improve BERG balance score to >/= 45/56 in order to indicate decreased fall risk.     Baseline 34/56 on 10-01-16; score 41/56 on 11-04-16   Time 4   Period Weeks   Status Revised   Target Date 12/05/16     PT LONG TERM GOAL #3   Title Pt will improve gait speed from 1.23 ft/sec with RW to >/= 1.8 ft/sec in order to indicate decreased fall risk and improved efficiency of gait.    Baseline 1.23 ft/sec with RW on 10-01-16; 22.31= 1.47 ft/sec with RW   Time 4   Period Weeks   Status On-going   Target Date 12/05/16     PT LONG TERM GOAL #4   Title Pt will ambulate 115' on flat, even surface with R AFO and SPC with rubber quad tip with supervision for incr. household accessibility.       Baseline SPC currently being used -11-04-16   Time 4   Period Weeks   Status Revised   Target Date 12/05/16     PT LONG TERM GOAL #5   Title Pt will transfer floor to stand with UE support with min assist for fall recovery as needed.     Baseline mod assist - 11-04-16   Time 4   Period Weeks   Status On-going   Target Date 12/05/16     PT LONG TERM GOAL #6   Title Improve TUG score from 30.16 secs with cane to </= 21secs with cane with SBA to demo improved functional mobility.   TARGET DATE 12-05-16   Baseline 26.97 secs with RW:  25.59 with cane on 11-04-16   Time 4   Period Weeks   Status Revised               Plan - 11/27/16 0853    Clinical Impression Statement Today's skilled session focused on gait with cane/simulated toe cap with pt having increased activity tolerance today vs previous session. One episode of balance loss toward end of gait rep due to fatigue needing up to min assist to correct balance. Remainder of session focused on LE strengthening with emphasis on right LE. Decreased weight and reps in hope to not have pt sore for 2 days post session. Pt is progressing  toward goals and should benefit from continued PT to progress toward unmet goals.    Rehab Potential Good   Clinical Impairments Affecting Rehab Potential severity of impairments and length of time since CVA (4 yrs); dense Rt hemiplegia   PT Frequency 2x /  week   PT Duration 4 weeks   PT Treatment/Interventions ADLs/Self Care Home Management;DME Instruction;Gait training;Stair training;Functional mobility training;Therapeutic activities;Therapeutic exercise;Balance training;Neuromuscular re-education;Patient/family education   PT Next Visit Plan begin to check LTGs due on 12/05/16   PT Home Exercise Plan balance, strengthening , stretching for RLE   Consulted and Agree with Plan of Care Patient      Patient will benefit from skilled therapeutic intervention in order to improve the following deficits and impairments:  Abnormal gait, Decreased balance, Decreased coordination, Decreased range of motion, Decreased strength, Impaired tone, Impaired UE functional use, Decreased activity tolerance  Visit Diagnosis: Other abnormalities of gait and mobility  Muscle weakness (generalized)  Unsteadiness on feet  Hemiplegia and hemiparesis 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 Muscle spasticity 10/04/2015  . 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 Right hemiparesis (HCC) 05/10/2014  . Abnormality of gait 05/10/2014  . Closed fracture of right hip (HCC) 11/24/2012  . 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 Stroke (HCC)   . Hypertension     Sallyanne Kuster, [place], [facility] [address] [place], [place] [address] [phone] 11/27/16, 9:32 AM   Name: Sophia Rodriguez MRN: [id number] Date of Birth: Apr 11, 1950

## 2016-12-02 ENCOUNTER — Ambulatory Visit: Payer: Medicare Other | Admitting: Physical Therapy

## 2016-12-03 ENCOUNTER — Encounter: Payer: Medicare Other | Attending: Physical Medicine & Rehabilitation | Admitting: Physical Medicine & Rehabilitation

## 2016-12-03 ENCOUNTER — Encounter: Payer: Self-pay | Admitting: Physical Medicine & Rehabilitation

## 2016-12-03 VITALS — BP 115/80 | HR 93

## 2016-12-03 DIAGNOSIS — E119 Type 2 diabetes mellitus without complications: Secondary | ICD-10-CM | POA: Insufficient documentation

## 2016-12-03 DIAGNOSIS — I10 Essential (primary) hypertension: Secondary | ICD-10-CM | POA: Insufficient documentation

## 2016-12-03 DIAGNOSIS — G8310 Monoplegia of lower limb affecting unspecified side: Secondary | ICD-10-CM

## 2016-12-03 DIAGNOSIS — R269 Unspecified abnormalities of gait and mobility: Secondary | ICD-10-CM | POA: Insufficient documentation

## 2016-12-03 DIAGNOSIS — I6930 Unspecified sequelae of cerebral infarction: Secondary | ICD-10-CM

## 2016-12-03 DIAGNOSIS — M62838 Other muscle spasm: Secondary | ICD-10-CM

## 2016-12-03 DIAGNOSIS — Z8673 Personal history of transient ischemic attack (TIA), and cerebral infarction without residual deficits: Secondary | ICD-10-CM | POA: Insufficient documentation

## 2016-12-03 DIAGNOSIS — I69898 Other sequelae of other cerebrovascular disease: Secondary | ICD-10-CM | POA: Insufficient documentation

## 2016-12-03 NOTE — Progress Notes (Signed)
Subjective:    Patient ID: Sophia Rodriguez, female    DOB: [birth year], 66 y.o.   MRN: [id number]  HPI  66 y/o female with pmh of strokes x3 (most recently ~2014), DM, HTN presents for follow up of spasticity.   Initially stated: The problem is mainly in her right leg.  It causes her difficulty with ambulation.  She uses a rolling walker currently and has not had any falls.  The problems started ~ 5 years ago getting worse over time. The biggest problems seems to be dragging of her foot.  It gets worse at the end of the day.  She has some associated weakness, but denies numbness/tingling.  Previously she was prescribed Baclofen 5, which did not work and 10, which caused her too much drowsiness.  She had Botox, but this was not effective, tizanidine made her too drowsy.   She was last seen in clinic on 10/22/16.  She had a Dysport injection at that time.  Since that time, she notes improvement in movement and mobility. She did not wear her brace today because she did not feel like it.  Pt poor historian.   Pain Inventory Average Pain 7 Pain Right Now 6 My pain is tingling and other  In the last 24 hours, has pain interfered with the following? General activity 4 Relation with others 4 Enjoyment of life 4 What TIME of day is your pain at its worst? evening Sleep (in general) Fair  Pain is worse with: standing Pain improves with: rest Relief from Meds: 5  Mobility walk with assistance ability to climb steps?  no do you drive?  no  Function disabled: date disabled . Do you have any goals in this area?  no  Neuro/Psych confusion depression anxiety  Prior Studies Any changes since last visit?  no  Physicians involved in your care Any changes since last visit?  no   Family History  Problem Relation Age of Onset  . Stroke Father   . Stroke Brother    Social History   Social History  . Marital status: Single    Spouse name: N/A  . Number of children: 2  . Years of  education: Grad school   Social History Main Topics  . Smoking status: Former Smoker    Quit date: 05/28/2007  . Smokeless tobacco: Never Used  . Alcohol use No  . Drug use: No  . Sexual activity: Not Asked   Other Topics Concern  . None   Social History Narrative   Lives at home by herself.    Daughter come to help at home and she has a cleaning lady to help twice a week.    Caffeine: Every morning 1-1.5 cups coffee/day, and 12oz tea/day    Right-handed   Past Surgical History:  Procedure Laterality Date  . HIP ARTHROPLASTY Right 11/24/2012   Procedure: RIGHT HIP HEMIARTHROPLASTY;  Surgeon: Verlee RossettiSteven R Norris, MD;  Location: [facility];  Service: Orthopedics;  Laterality: Right;  hemiatroplasty, DePuy Triloc   Past Medical History:  Diagnosis Date  . Arthritis   . Diabetes mellitus   . Hypertension   . Stroke (HCC)    BP 115/80   Pulse 93   SpO2 95%   Opioid Risk Score:   Fall Risk Score:  `1  Depression screen PHQ 2/9  Depression screen [facility] 12/03/2016 06/12/2016 01/11/2016 11/01/2015 07/13/2015 06/15/2015  Decreased Interest 1 1 0 0 1 1  Down, Depressed, Hopeless 1 1 0 0 1  1  PHQ - 2 Score 2 2 0 0 2 2  Altered sleeping - - - - - 3  Tired, decreased energy - - - - - 2  Change in appetite - - - - - 0  Feeling bad or failure about yourself  - - - - - 1  Trouble concentrating - - - - - 3  Moving slowly or fidgety/restless - - - - - 2  Suicidal thoughts - - - - - 0  PHQ-9 Score - - - - - 13  Difficult doing work/chores - - - - - Somewhat difficult   Review of Systems  Musculoskeletal: Positive for gait problem.  Neurological: Positive for weakness.  Psychiatric/Behavioral: Positive for dysphoric mood. Memory loss. All other systems reviewed and are negative.     Objective:   Physical Exam HENT: Normocephalic, Atraumatic Eyes: EOMI. No discharge.  Cardio: RRR. No JVD. Pulm: B/l clear to auscultation.  Effort normal. Abd: Soft, BS+ MSK:  Gait improved, did not bring  AFO today.   No TTP.    No edema. Neuro:   Sensation intact to light touch in all LE dermatomes  Strength  4+/5 in RLE myotomes (stable)    5/5 in LLE myotomes MAS: Right plantar flexors: 1/4 Skin: Warm and Dry. Intact.     Assessment & Plan:  66 y/o female with pmh of strokes x3 (most recently ~2014), DM, HTN presents for follow up of spasticity.   1. RLE spasticity s/p stroke  Side effects with Tizanidine, balcofen  Encouraged muscle stretches.    Completed PT, cont HEP  Encouraged presence of daughter.  Cont AFO  Pt would like to increase dose, plant for    250U Dysport to right lateral gastroc   250U Dysport to left lateral gastroc  Cont PT  2. Abnormality of gait  Toe walking with RLE, improving  Cont walker for safety  Pt would like to walk with a cane at all times as her goal - cont PT

## 2016-12-04 ENCOUNTER — Ambulatory Visit: Payer: Medicare Other | Admitting: Physical Therapy

## 2016-12-04 DIAGNOSIS — M6281 Muscle weakness (generalized): Secondary | ICD-10-CM

## 2016-12-04 DIAGNOSIS — R2689 Other abnormalities of gait and mobility: Secondary | ICD-10-CM

## 2016-12-04 DIAGNOSIS — R2681 Unsteadiness on feet: Secondary | ICD-10-CM

## 2016-12-04 NOTE — Therapy (Signed)
[facility] [address] [place], [place], [address] Phone: [phone]   Fax:  [phone]  Physical Therapy Treatment  Patient Details  Name: Sophia Rodriguez MRN: [id number] Date of Birth: 1950/07/08 Referring Provider: Dr. Delice Lesch  Encounter Date: 12/04/2016      PT End of Session - 12/04/16 1002    Visit Number 14  G code completed 12-04-16 (this visit G10 - new G code count to start next visit)   Number of Visits 23   Date for PT Re-Evaluation 01/02/17  Renewal completed 12-04-16 for 4 additional weeks   Authorization Type Medicare   Authorization Time Period 12-04-16 - 02-02-17   PT Start Time 0846   PT Stop Time 0933   PT Time Calculation (min) 47 min      Past Medical History:  Diagnosis Date  . Arthritis   . Diabetes mellitus   . Hypertension   . Stroke [facility]     Past Surgical History:  Procedure Laterality Date  . HIP ARTHROPLASTY Right 11/24/2012   Procedure: RIGHT HIP HEMIARTHROPLASTY;  Surgeon: Augustin Schooling, MD;  Location: [facility];  Service: Orthopedics;  Laterality: Right;  hemiatroplasty, DePuy Triloc    There were no vitals filed for this visit.      Subjective Assessment - 12/04/16 0938    Subjective Pt states she obtained leather toe cap on right shoe last Friday - can tell that it helps with clearing foot on floor; continues to walk with supervision with cane at home; states this week is her last scheduled appts - wants to continue with PT if she can   Pertinent History h/o CVA's x 3 with most recent one in 2014; Type II DM:  s/p Rt hip hemiarthroplasty due to fall (Sept. 2014); spasticity:  DM Type II   Patient Stated Goals be able to walk with the cane "and to use in the community if I can get that far"   Currently in Pain? No/denies            [facility] PT Assessment - 12/04/16 0910      Berg Balance Test   Sit to Stand Able to stand without using hands and stabilize  independently   Standing Unsupported Able to stand safely 2 minutes   Sitting with Back Unsupported but Feet Supported on Floor or Stool Able to sit safely and securely 2 minutes   Stand to Sit Sits safely with minimal use of hands   Transfers Able to transfer safely, definite need of hands   Standing Unsupported with Eyes Closed Able to stand 10 seconds safely   Standing Ubsupported with Feet Together Able to place feet together independently and stand 1 minute safely   From Standing, Reach Forward with Outstretched Arm Can reach confidently >25 cm (10")   From Standing Position, Pick up Object from Floor Able to pick up shoe safely and easily   From Standing Position, Turn to Look Behind Over each Shoulder Looks behind one side only/other side shows less weight shift   Turn 360 Degrees Able to turn 360 degrees safely but slowly  12.66 secs to R: 13.8 secs to Lt   Standing Unsupported, Alternately Place Feet on Step/Stool Able to complete >2 steps/needs minimal assist   Standing Unsupported, One Foot in Front Able to take small step independently and hold 30 seconds   Standing on One Leg Tries to lift leg/unable to hold 3 seconds but remains standing independently   Total  Score 44                     [facility] Adult PT Treatment/Exercise - 12/04/16 0940      Ambulation/Gait   Ambulation/Gait Yes   Ambulation/Gait Assistance 5: Supervision   Ambulation/Gait Assistance Details CGA provided x 1 occurrence due to right foot scuffing floor after negotiating curve on track   Ambulation Distance (Feet) 345 Feet   Assistive device Straight cane  with rubber quad tip   Gait Pattern Step-through pattern;Decreased stride length;Decreased step length - left;Decreased stance time - right;Narrow base of support;Poor foot clearance - right   Ambulation Surface Level;Indoor   Gait velocity 18.3 secs = 1.79 ft/sec with RW with leather toe cap on Rt shoe   Stairs Yes   Stairs Assistance 4: Min  guard   Stairs Assistance Details (indicate cue type and reason) pt initially used RLE first in stepping up onto 1st step - instructed pt to use LLE first for increased ease with step negotiation   Stair Management Technique Two rails;Step to pattern;Forwards   Number of Stairs 4   Height of Stairs 6     Lumbar Exercises: Machines for Strengthening   Leg Press 40# bil. LE's 15 reps:  RLE only 25# 15 reps                     PT Long Term Goals - 12/04/16 0945      PT LONG TERM GOAL #1   Title Independent in updated HEP as appropriate for LE strengthening and balance.   Baseline met 11-04-16   Time 4   Period Weeks   Status Achieved   Target Date 12/05/16     PT LONG TERM GOAL #2   Title Pt will improve BERG balance score to >/= 45/56 in order to indicate decreased fall risk.     Baseline 44/56 on 12-04-16   Time 4   Period Weeks   Status On-going   Target Date 01/02/17     PT LONG TERM GOAL #3   Title Pt will improve gait speed from 1.23 ft/sec with RW to >/= 1.8 ft/sec in order to indicate decreased fall risk and improved efficiency of gait.    Baseline 1.23 ft/sec with RW on 10-01-16; 22.31= 1.47 ft/sec with RW; 18.3 secs = 1.79 ft/sec = 1.8 ft/sec with cane   Time 4   Period Weeks   Status Achieved   Target Date 12/05/16     PT LONG TERM GOAL #4   Title Pt will ambulate 115' on flat, even surface with R AFO and SPC with rubber quad tip with supervision for incr. household accessibility.       Baseline pt amb. 345' with SPC with rubber quad tip with CGA; Rt leg and foot drags more with fatigue   Time 4   Period Weeks   Status Achieved   Target Date 12/05/16     PT LONG TERM GOAL #5   Title Pt will transfer floor to stand with UE support with min assist for fall recovery as needed.     Baseline mod assist - 11-04-16   Time 4   Period Weeks   Status On-going   Target Date 01/02/17     PT LONG TERM GOAL #6   Title Improve TUG score from 30.16 secs with  cane to </= 21secs with cane with SBA to demo improved functional mobility.   TARGET DATE  Baseline 38.19 secs with cane - assessed at end of session and pt was fatigued - will reassess this goal at start of next session to determine score when pt not fatigued   Time 4   Period Weeks   Status On-going   Target Date 01/02/17     PT LONG TERM GOAL #7   Title Pt will amb. with SPC with rubber quad tip 500' with SBA to demo increased endurance for incr. community accessibility with use of cane.   Baseline 345' with SPC   Time 4   Period Weeks   Status New   Target Date 01/02/17     PT LONG TERM GOAL #8   Title Modified indepedent household amb. with SPC at least 25% of time during the day.   Baseline pt using RW at all times unless she has someone there to provide supervision   Time 4   Period Weeks   Status New   Target Date 01/02/17               Plan - January 03, 2017 0848    Clinical Impression Statement Pt has met LTG #1, 3-4; #2 LTG is ongoing as pt is progressing but goal not fully achieved;  #5 LTG ongoing - pt requested to not attempt floor to stand transfer today but to work on this later when not fatigued.  Leather toe cap on Rt shoe improves Rt toe clearance until pt becomes fatigued.  Pt continues to demonstrate decr. Rt hip and knee flexor weakness resulting in pt dragging RLE in swing phase of gait.   Rehab Potential Good   Clinical Impairments Affecting Rehab Potential severity of impairments and length of time since CVA (4 yrs); dense Rt hemiplegia   PT Frequency 2x / week   PT Duration 4 weeks  Renewal completed 01-03-17 for 4 additional weeks   PT Treatment/Interventions ADLs/Self Care Home Management;DME Instruction;Gait training;Stair training;Functional mobility training;Therapeutic activities;Therapeutic exercise;Balance training;Neuromuscular re-education;Patient/family education   PT Next Visit Plan Re-assess TUG first thing next session before pt becomes  fatigued; TUG was assessed on 03-Jan-2017 near end of PT session so I would like to know score when pt not tired:  continue RLE strengthening and gait; plan on continuing for 4 more weeks   PT Home Exercise Plan balance, strengthening , stretching for RLE   Consulted and Agree with Plan of Care Patient      Patient will benefit from skilled therapeutic intervention in order to improve the following deficits and impairments:  Abnormal gait, Decreased balance, Decreased coordination, Decreased range of motion, Decreased strength, Impaired tone, Impaired UE functional use, Decreased activity tolerance  Visit Diagnosis: Other abnormalities of gait and mobility - Plan: PT plan of care cert/re-cert  Muscle weakness (generalized) - Plan: PT plan of care cert/re-cert  Unsteadiness on feet - Plan: PT plan of care cert/re-cert       G-Codes - 01-03-17 1012    Functional Assessment Tool Used (Outpatient Only) Berg score 44/56:  gait velocity 1.8 ft/sec with RW:  pt amb. 345' with SPC - pt now has leather toe cap on Rt shoe   Functional Limitation Mobility: Walking and moving around   Mobility: Walking and Moving Around Current Status (D3220) At least 40 percent but less than 60 percent impaired, limited or restricted   Mobility: Walking and Moving Around Goal Status (U5427) At least 20 percent but less than 40 percent impaired, limited or restricted      Problem List Patient Active  Problem List   Diagnosis Date Noted  . Muscle spasticity 10/04/2015  . 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 Right hemiparesis ([place]) 05/10/2014  . Abnormality of gait 05/10/2014  . Closed fracture of right hip ([place]) 11/24/2012  . Thrombotic cerebral infarction ([place]) 09/23/2012  . DM type 2 causing neurological disease ([place]) 08/26/2012  . Stroke ([place])   . Hypertension     Ralpheal Zappone, [place], PT 12/04/2016, 10:25 AM  [facility] [address] [place], [place], [address] Phone: [phone]   Fax:  [phone]  Name: Sophia Rodriguez MRN: [id number] Date of Birth: 05/05/1950

## 2016-12-05 ENCOUNTER — Ambulatory Visit: Payer: Medicare Other | Admitting: Physical Therapy

## 2016-12-05 ENCOUNTER — Encounter: Payer: Self-pay | Admitting: Physical Therapy

## 2016-12-05 DIAGNOSIS — R2681 Unsteadiness on feet: Secondary | ICD-10-CM

## 2016-12-05 DIAGNOSIS — R2689 Other abnormalities of gait and mobility: Secondary | ICD-10-CM

## 2016-12-05 DIAGNOSIS — M6281 Muscle weakness (generalized): Secondary | ICD-10-CM

## 2016-12-06 NOTE — Therapy (Signed)
[facility] [address] [place], [place], [address] Phone: [phone]   Fax:  [phone]  Physical Therapy Treatment  Patient Details  Name: Sophia Rodriguez MRN: [id number] Date of Birth: Feb 07, 1951 Referring Provider: Dr. Delice Lesch  Encounter Date: 12/05/2016   12/05/16 0854  PT Visits / Re-Eval  Visit Number 15 (G code due on visit number 24)  Number of Visits 23  Date for PT Re-Evaluation 01/02/17 (Renewal completed 12-04-16 for 4 additional weeks)  Authorization  Authorization Type Medicare  Authorization Time Period 12-04-16 - 02-02-17  PT Time Calculation  PT Start Time 0850  PT Stop Time 0930  PT Time Calculation (min) 40 min  PT - End of Session  Equipment Utilized During Treatment Gait belt  Activity Tolerance Patient tolerated treatment well  Behavior During Therapy [facility] for tasks assessed/performed     Past Medical History:  Diagnosis Date  . Arthritis   . Diabetes mellitus   . Hypertension   . Stroke [facility])     Past Surgical History:  Procedure Laterality Date  . HIP ARTHROPLASTY Right 11/24/2012   Procedure: RIGHT HIP HEMIARTHROPLASTY;  Surgeon: Augustin Schooling, MD;  Location: [facility];  Service: Orthopedics;  Laterality: Right;  hemiatroplasty, DePuy Triloc    There were no vitals filed for this visit.     12/05/16 0853  Symptoms/Limitations  Subjective No new complaints. No falls to report.   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 2014); spasticity:  DM Type II  Patient Stated Goals be able to walk with the cane "and to use in the community if I can get that far"  Pain Assessment  Currently in Pain? No/denies  Pain Score 0      12/05/16 0900  Transfers  Transfers Sit to Stand;Stand to Sit;Floor to Transfer  Sit to Stand 5: Supervision;With upper extremity  assist;From chair/3-in-1;From bed  Stand to Sit 5: Supervision;With upper extremity assist;To bed;To chair/3-in-1  Floor to Transfer 4: Min assist;With upper extremity assist  Floor to Transfer Details (indicate cue type and reason) multimodal cues and increased time needed for technique to get down to floor on red mat into long sitting and then on sequencing/technique to get back up to sitting on blue mat table with UE support.   Ambulation/Gait  Ambulation/Gait Yes  Ambulation/Gait Assistance 5: Supervision;4: Min guard  Ambulation/Gait Assistance Details cues on posture and step length. one episode of balance loss with foot scuffing with pt self correcting with only min guard assist needed.   Ambulation Distance (Feet) 220 Feet (x1, 350 x1)  Assistive device Straight cane (with rubber quad cane)  Gait Pattern Step-through pattern;Decreased stride length;Decreased step length - left;Decreased stance time - right;Narrow base of support;Poor foot clearance - right  Ambulation Surface Level;Indoor  Timed Up and Go Test  TUG Normal TUG  Normal TUG (seconds) 19.5 (with straight cane with rubber quad tip)           PT Long Term Goals - 12/04/16 0945      PT LONG TERM GOAL #1   Title Independent in updated HEP as appropriate for LE strengthening and balance.   Baseline met 11-04-16   Time 4   Period Weeks   Status Achieved   Target Date 12/05/16     PT LONG TERM GOAL #2   Title Pt  will improve BERG balance score to >/= 45/56 in order to indicate decreased fall risk.     Baseline 44/56 on 12-04-16   Time 4   Period Weeks   Status On-going   Target Date 01/02/17     PT LONG TERM GOAL #3   Title Pt will improve gait speed from 1.23 ft/sec with RW to >/= 1.8 ft/sec in order to indicate decreased fall risk and improved efficiency of gait.    Baseline 1.23 ft/sec with RW on 10-01-16; 22.31= 1.47 ft/sec with RW; 18.3 secs = 1.79 ft/sec = 1.8 ft/sec with cane   Time 4   Period Weeks    Status Achieved   Target Date 12/05/16     PT LONG TERM GOAL #4   Title Pt will ambulate 115' on flat, even surface with R AFO and SPC with rubber quad tip with supervision for incr. household accessibility.       Baseline pt amb. 345' with SPC with rubber quad tip with CGA; Rt leg and foot drags more with fatigue   Time 4   Period Weeks   Status Achieved   Target Date 12/05/16     PT LONG TERM GOAL #5   Title Pt will transfer floor to stand with UE support with min assist for fall recovery as needed.     Baseline mod assist - 11-04-16   Time 4   Period Weeks   Status On-going   Target Date 01/02/17     PT LONG TERM GOAL #6   Title Improve TUG score from 30.16 secs with cane to </= 21secs with cane with SBA to demo improved functional mobility.   TARGET DATE    Baseline 38.19 secs with cane - assessed at end of session and pt was fatigued - will reassess this goal at start of next session to determine score when pt not fatigued   Time 4   Period Weeks   Status On-going   Target Date 01/02/17     PT LONG TERM GOAL #7   Title Pt will amb. with SPC with rubber quad tip 500' with SBA to demo increased endurance for incr. community accessibility with use of cane.   Baseline 345' with SPC   Time 4   Period Weeks   Status New   Target Date 01/02/17     PT LONG TERM GOAL #8   Title Modified indepedent household amb. with SPC at least 25% of time during the day.   Baseline pt using RW at all times unless she has someone there to provide supervision   Time 4   Period Weeks   Status New   Target Date 01/02/17        12/05/16 0854  Plan  Clinical Impression Statement Today's skilled session continued to address floor transfers and gait with cane. Pt did demo improved TUG score when tested at beginng of today's session vs when she was tested at end of session last time. Pt is progressing toward goals and should benefit from continued PT to progress toward unmet goals.  Pt will  benefit from skilled therapeutic intervention in order to improve on the following deficits Abnormal gait;Decreased balance;Decreased coordination;Decreased range of motion;Decreased strength;Impaired tone;Impaired UE functional use;Decreased activity tolerance  Rehab Potential Good  Clinical Impairments Affecting Rehab Potential severity of impairments and length of time since CVA (4 yrs); dense Rt hemiplegia  PT Frequency 2x / week  PT Duration 4 weeks (Renewal completed 01-03-17 for 4 additional  weeks)  PT Treatment/Interventions ADLs/Self Care Home Management;DME Instruction;Gait training;Stair training;Functional mobility training;Therapeutic activities;Therapeutic exercise;Balance training;Neuromuscular re-education;Patient/family education  PT Next Visit Plan continue RLE strengthening and gait  PT Home Exercise Plan balance, strengthening , stretching for RLE  Consulted and Agree with Plan of Care Patient          Patient will benefit from skilled therapeutic intervention in order to improve the following deficits and impairments:  Abnormal gait, Decreased balance, Decreased coordination, Decreased range of motion, Decreased strength, Impaired tone, Impaired UE functional use, Decreased activity tolerance  Visit Diagnosis: Other abnormalities of gait and mobility  Muscle weakness (generalized)  Unsteadiness on feet     Problem List Patient Active Problem List   Diagnosis Date Noted  . Muscle spasticity 10/04/2015  . 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 Right hemiparesis ([place]) 05/10/2014  . Abnormality of gait 05/10/2014  . Closed fracture of right hip ([place]) 11/24/2012  . 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 Stroke ([place])   . Hypertension     [facility], [place], [place] [address] [place], [place]  [address] [phone] 12/06/16, 6:04 PM   Name: Sophia Rodriguez MRN: [id number] Date of Birth: 1951-01-12

## 2016-12-12 ENCOUNTER — Encounter: Payer: Self-pay | Admitting: Physical Therapy

## 2016-12-12 ENCOUNTER — Ambulatory Visit: Payer: Medicare Other | Admitting: Physical Therapy

## 2016-12-12 DIAGNOSIS — R2689 Other abnormalities of gait and mobility: Secondary | ICD-10-CM

## 2016-12-12 DIAGNOSIS — R2681 Unsteadiness on feet: Secondary | ICD-10-CM

## 2016-12-12 DIAGNOSIS — M6281 Muscle weakness (generalized): Secondary | ICD-10-CM

## 2016-12-12 NOTE — Patient Instructions (Signed)
FUNCTIONAL MOBILITY: Marching - Standing    March in place by lifting left leg up, hold for 3 counts, then right and hold for 3 counts. Alternate. __20_ reps per set, __1_ sets per day, __5_ days per week Hold onto a support.  Copyright  VHI. All rights reserved.    On days you use only your walker at home, take 5 minutes to focus on your walking and picking up right foot to land with your heel first.   When you go to the grocery store with your aide, push the grocery cart while she gets the items in the cart for you.

## 2016-12-12 NOTE — Therapy (Signed)
[facility] [address] [place], [place], [address] Phone: [phone]   Fax:  [phone]  Physical Therapy Treatment  Patient Details  Name: Sophia Rodriguez MRN: [id number] Date of Birth: 10-26-1950 Referring Provider: Dr. Delice Lesch  Encounter Date: 12/12/2016      PT End of Session - 12/12/16 1949    Visit Number 16  G code due on visit number 24   Number of Visits 23   Date for PT Re-Evaluation 01/02/17  Renewal completed 12-04-16 for 4 additional weeks   Authorization Type Medicare   Authorization Time Period 12-04-16 - 02-02-17   PT Start Time 0851   PT Stop Time 0931   PT Time Calculation (min) 40 min   Equipment Utilized During Treatment Gait belt   Activity Tolerance Patient tolerated treatment well   Behavior During Therapy [facility] for tasks assessed/performed      Past Medical History:  Diagnosis Date  . Arthritis   . Diabetes mellitus   . Hypertension   . Stroke [facility])     Past Surgical History:  Procedure Laterality Date  . HIP ARTHROPLASTY Right 11/24/2012   Procedure: RIGHT HIP HEMIARTHROPLASTY;  Surgeon: Augustin Schooling, MD;  Location: [facility];  Service: Orthopedics;  Laterality: Right;  hemiatroplasty, DePuy Triloc    There were no vitals filed for this visit.      Subjective Assessment - 12/12/16 0854    Subjective No new complaints. No falls or major LOB to report.    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 2014); spasticity:  DM Type II   Patient Stated Goals be able to walk with the cane "and to use in the community if I can get that far"   Currently in Pain? No/denies                         [facility] Adult PT Treatment/Exercise - 12/12/16 0932      Transfers   Transfers Sit to Stand;Stand to Sit;Floor to Transfer   Sit to Stand With upper  extremity assist;From bed;4: Min assist;4: Min guard;Without upper extremity assist;From chair/3-in-1   Stand to Sit 5: Supervision;With upper extremity assist;To bed;To chair/3-in-1;4: Min assist;Without upper extremity assist   Stand to Sit Details vc and demonstration of technique to control descent   Floor to Transfer --     Ambulation/Gait   Ambulation/Gait Yes   Ambulation/Gait Assistance 5: Supervision;4: Min guard   Ambulation/Gait Assistance Details cues on rt foot clearance and heel strike   Ambulation Distance (Feet) 400 Feet  120, 120   Assistive device None  with rubber quad tip   Gait Pattern Step-through pattern;Decreased step length - left;Decreased stance time - right;Poor foot clearance - right;Decreased dorsiflexion - right;Right flexed knee in stance;Left flexed knee in stance   Ambulation Surface Level;Indoor   Gait Comments Discussed with pt trying to push her grocery cart thorugh the store instead of using the electric cart. Educated to attempt thisonly when someone is with her     Timed Up and Go Test   TUG --   Normal TUG (seconds) --             Balance Exercises - 12/12/16 1936      Balance Exercises: Standing   SLS with Vectors Solid surface;Upper extremity assist  1;Intermittent upper extremity assist  touching heels to foam bubbles with alternate legs; 20 each   Balance Beam step over and back of black beam with light UE assist; x 10   Marching Limitations with 2 second hold and limited single UE support           PT Education - 12/12/16 0931    Education provided Yes   Education Details changes and additions to HEP; how to grade sit to stands when knee pain increases and that pain will ultimately be less as her knees get stronger   Person(s) Educated Patient   Methods Explanation;Demonstration;Tactile cues;Verbal cues;Handout   Comprehension Verbalized understanding;Returned demonstration;Verbal cues required;Tactile cues required;Need  further instruction             PT Long Term Goals - 12/04/16 0945      PT LONG TERM GOAL #1   Title Independent in updated HEP as appropriate for LE strengthening and balance.   Baseline met 11-04-16   Time 4   Period Weeks   Status Achieved   Target Date 12/05/16     PT LONG TERM GOAL #2   Title Pt will improve BERG balance score to >/= 45/56 in order to indicate decreased fall risk.     Baseline 44/56 on 12-04-16   Time 4   Period Weeks   Status On-going   Target Date 01/02/17     PT LONG TERM GOAL #3   Title Pt will improve gait speed from 1.23 ft/sec with RW to >/= 1.8 ft/sec in order to indicate decreased fall risk and improved efficiency of gait.    Baseline 1.23 ft/sec with RW on 10-01-16; 22.31= 1.47 ft/sec with RW; 18.3 secs = 1.79 ft/sec = 1.8 ft/sec with cane   Time 4   Period Weeks   Status Achieved   Target Date 12/05/16     PT LONG TERM GOAL #4   Title Pt will ambulate 115' on flat, even surface with R AFO and SPC with rubber quad tip with supervision for incr. household accessibility.       Baseline pt amb. 345' with SPC with rubber quad tip with CGA; Rt leg and foot drags more with fatigue   Time 4   Period Weeks   Status Achieved   Target Date 12/05/16     PT LONG TERM GOAL #5   Title Pt will transfer floor to stand with UE support with min assist for fall recovery as needed.     Baseline mod assist - 11-04-16   Time 4   Period Weeks   Status On-going   Target Date 01/02/17     PT LONG TERM GOAL #6   Title Improve TUG score from 30.16 secs with cane to </= 21secs with cane with SBA to demo improved functional mobility.   TARGET DATE    Baseline 38.19 secs with cane - assessed at end of session and pt was fatigued - will reassess this goal at start of next session to determine score when pt not fatigued   Time 4   Period Weeks   Status On-going   Target Date 01/02/17     PT LONG TERM GOAL #7   Title Pt will amb. with SPC with rubber quad tip  500' with SBA to demo increased endurance for incr. community accessibility with use of cane.   Baseline 345' with SPC   Time 4   Period Weeks   Status New   Target Date 01/02/17  PT LONG TERM GOAL #8   Title Modified indepedent household amb. with SPC at least 25% of time during the day.   Baseline pt using RW at all times unless she has someone there to provide supervision   Time 4   Period Weeks   Status New   Target Date 01/02/17               Plan - 12/12/16 1952    Clinical Impression Statement Skilled session focused on pre-gait and gait traing. Patient progressing towards goals.    Rehab Potential Good   Clinical Impairments Affecting Rehab Potential severity of impairments and length of time since CVA (4 yrs); dense Rt hemiplegia   PT Frequency 2x / week   PT Duration 4 weeks  Renewal completed 01-03-17 for 4 additional weeks   PT Treatment/Interventions ADLs/Self Care Home Management;DME Instruction;Gait training;Stair training;Functional mobility training;Therapeutic activities;Therapeutic exercise;Balance training;Neuromuscular re-education;Patient/family education   PT Next Visit Plan continue RLE strengthening and gait   PT Home Exercise Plan balance, strengthening , stretching for RLE   Consulted and Agree with Plan of Care Patient      Patient will benefit from skilled therapeutic intervention in order to improve the following deficits and impairments:  Abnormal gait, Decreased balance, Decreased coordination, Decreased range of motion, Decreased strength, Impaired tone, Impaired UE functional use, Decreased activity tolerance  Visit Diagnosis: Other abnormalities of gait and mobility  Muscle weakness (generalized)  Unsteadiness on feet     Problem List Patient Active Problem List   Diagnosis Date Noted  . Muscle spasticity 10/04/2015  . 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 Right hemiparesis ([place]) 05/10/2014  .  Abnormality of gait 05/10/2014  . Closed fracture of right hip ([place]) 11/24/2012  . 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 Stroke ([place])   . Hypertension     Rexanne Mano, PT 12/12/2016, 7:57 PM  [facility] [address] [place], [place], [address] Phone: [phone]   Fax:  [phone]  Name: Sophia Rodriguez MRN: [id number] Date of Birth: 1950/05/01

## 2016-12-18 ENCOUNTER — Encounter: Payer: Self-pay | Admitting: Physical Therapy

## 2016-12-18 ENCOUNTER — Ambulatory Visit: Payer: Medicare Other | Admitting: Physical Therapy

## 2016-12-18 DIAGNOSIS — M6281 Muscle weakness (generalized): Secondary | ICD-10-CM

## 2016-12-18 DIAGNOSIS — I69951 Hemiplegia and hemiparesis following unspecified cerebrovascular disease affecting right dominant side: Secondary | ICD-10-CM

## 2016-12-18 DIAGNOSIS — R2681 Unsteadiness on feet: Secondary | ICD-10-CM

## 2016-12-18 DIAGNOSIS — R2689 Other abnormalities of gait and mobility: Secondary | ICD-10-CM | POA: Diagnosis not present

## 2016-12-18 NOTE — Therapy (Signed)
[facility] [address] [place], [place], [address] Phone: [phone]   Fax:  [phone]  Physical Therapy Treatment  Patient Details  Name: Sophia Rodriguez MRN: [id number] Date of Birth: 12-17-1950 Referring Provider: Dr. Delice Lesch  Encounter Date: 12/18/2016      PT End of Session - 12/18/16 1013    Visit Number 17  G code due on visit number 24   Number of Visits 23   Date for PT Re-Evaluation 01/02/17  Renewal completed 12-04-16 for 4 additional weeks   Authorization Type Medicare   Authorization Time Period 12-04-16 - 02-02-17   PT Start Time 0935   PT Stop Time 1010  [facility] came to get pt early   PT Time Calculation (min) 35 min   Equipment Utilized During Treatment Gait belt   Activity Tolerance Patient tolerated treatment well   Behavior During Therapy [facility] for tasks assessed/performed      Past Medical History:  Diagnosis Date  . Arthritis   . Diabetes mellitus   . Hypertension   . Stroke [facility])     Past Surgical History:  Procedure Laterality Date  . HIP ARTHROPLASTY Right 11/24/2012   Procedure: RIGHT HIP HEMIARTHROPLASTY;  Surgeon: Augustin Schooling, MD;  Location: [facility];  Service: Orthopedics;  Laterality: Right;  hemiatroplasty, DePuy Triloc    There were no vitals filed for this visit.      Subjective Assessment - 12/18/16 0944    Subjective No new issues to report.  Transportation brought her early and she is going to have to leave early.  Wants to really focus on using the cane.   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 2014); spasticity:  DM Type II   Patient Stated Goals be able to walk with the cane "and to use in the community if I can get that far"   Currently in Pain? No/denies                         [facility] Adult PT Treatment/Exercise - 12/18/16 1014      Ambulation/Gait   Ambulation/Gait Yes   Ambulation/Gait Assistance 4: Min guard   Ambulation/Gait Assistance Details cues for full RLE advancement and heel strike and for terminal hip and knee extension in stance to propel pt forwards and allow full LLE step length.  As pt fatigued her RLE remained in hip and knee flexion in stance and demonstrated shorter step length and decreased foot clearance RLE   Ambulation Distance (Feet) 300 Feet   Assistive device Straight cane  with quad tip   Gait Pattern Step-through pattern;Decreased step length - right;Decreased step length - left;Decreased stance time - right;Decreased stride length;Decreased hip/knee flexion - right;Decreased dorsiflexion - right;Decreased trunk rotation;Poor foot clearance - right   Ambulation Surface Level;Indoor   Stairs Yes   Stairs Assistance 4: Min assist   Stairs Assistance Details (indicate cue type and reason) provided facilitation to RLE when ascending for terminal hip and knee extension to fully advance LLE to next step.  Pt cued to ascend with alternating sequence, descend with step to sequence due to AFO   Stair Management Technique Two rails;Alternating pattern;Step to pattern;Forwards   Number of Stairs 12   Height of Stairs 6     Knee/Hip Exercises: Standing   Forward Step Up Right;1 set;10 reps;Hand Hold: 2;Step Height:  6"   Forward Step Up Limitations Step ups with RLE to focus on full hip and knee extension and forward weight shift over stance RLE with LLE hip flexion with verbal cues to decrease UE pulling             Balance Exercises - 12/18/16 0944      Balance Exercises: Standing   SLS with Vectors Solid surface;Upper extremity assist 1  Toe, toe/heel taps to balance bubbles in front, L and R   Tandem Gait Forward;Upper extremity support;4 reps                PT Long Term Goals - 12/04/16 0945      PT LONG TERM GOAL #1   Title Independent in updated HEP as appropriate for LE strengthening and  balance.   Baseline met 11-04-16   Time 4   Period Weeks   Status Achieved   Target Date 12/05/16     PT LONG TERM GOAL #2   Title Pt will improve BERG balance score to >/= 45/56 in order to indicate decreased fall risk.     Baseline 44/56 on 12-04-16   Time 4   Period Weeks   Status On-going   Target Date 01/02/17     PT LONG TERM GOAL #3   Title Pt will improve gait speed from 1.23 ft/sec with RW to >/= 1.8 ft/sec in order to indicate decreased fall risk and improved efficiency of gait.    Baseline 1.23 ft/sec with RW on 10-01-16; 22.31= 1.47 ft/sec with RW; 18.3 secs = 1.79 ft/sec = 1.8 ft/sec with cane   Time 4   Period Weeks   Status Achieved   Target Date 12/05/16     PT LONG TERM GOAL #4   Title Pt will ambulate 115' on flat, even surface with R AFO and SPC with rubber quad tip with supervision for incr. household accessibility.       Baseline pt amb. 345' with SPC with rubber quad tip with CGA; Rt leg and foot drags more with fatigue   Time 4   Period Weeks   Status Achieved   Target Date 12/05/16     PT LONG TERM GOAL #5   Title Pt will transfer floor to stand with UE support with min assist for fall recovery as needed.     Baseline mod assist - 11-04-16   Time 4   Period Weeks   Status On-going   Target Date 01/02/17     PT LONG TERM GOAL #6   Title Improve TUG score from 30.16 secs with cane to </= 21secs with cane with SBA to demo improved functional mobility.   TARGET DATE    Baseline 38.19 secs with cane - assessed at end of session and pt was fatigued - will reassess this goal at start of next session to determine score when pt not fatigued   Time 4   Period Weeks   Status On-going   Target Date 01/02/17     PT LONG TERM GOAL #7   Title Pt will amb. with SPC with rubber quad tip 500' with SBA to demo increased endurance for incr. community accessibility with use of cane.   Baseline 345' with SPC   Time 4   Period Weeks   Status New   Target Date  01/02/17     PT LONG TERM GOAL #8   Title Modified indepedent household amb. with SPC at least 25% of time during the  day.   Baseline pt using RW at all times unless she has someone there to provide supervision   Time 4   Period Weeks   Status New   Target Date 01/02/17               Plan - 12/18/16 1223    Clinical Impression Statement Continued to focus on NMR for weight shifting, single limb stance, initiation of RLE flexion/advancement with pelvic rotation and strengthening/activation of RLE in stance for terminal hip and knee extension for greater propulsion during ambulation, changes in direction with SPC and stair negotiation.  Pt tolerated well but fatigued quickly during gait with SPC.  Will continue to progress.   Rehab Potential Good   Clinical Impairments Affecting Rehab Potential severity of impairments and length of time since CVA (4 yrs); dense Rt hemiplegia   PT Frequency 2x / week   PT Duration 4 weeks  Renewal completed 01-03-17 for 4 additional weeks   PT Treatment/Interventions ADLs/Self Care Home Management;DME Instruction;Gait training;Stair training;Functional mobility training;Therapeutic activities;Therapeutic exercise;Balance training;Neuromuscular re-education;Patient/family education   PT Next Visit Plan continue RLE strengthening and gait with cane, R NMR   PT Home Exercise Plan balance, strengthening , stretching for RLE   Consulted and Agree with Plan of Care Patient      Patient will benefit from skilled therapeutic intervention in order to improve the following deficits and impairments:  Abnormal gait, Decreased balance, Decreased coordination, Decreased range of motion, Decreased strength, Impaired tone, Impaired UE functional use, Decreased activity tolerance  Visit Diagnosis: Other abnormalities of gait and mobility  Muscle weakness (generalized)  Unsteadiness on feet  Hemiplegia 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 Muscle spasticity 10/04/2015  . 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 Right hemiparesis ([place]) 05/10/2014  . Abnormality of gait 05/10/2014  . Closed fracture of right hip ([place]) 11/24/2012  . 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 Stroke ([place])   . Hypertension     Raylene Everts, PT, DPT 12/18/16    12:26 PM    [facility] [address] [place], [place], [address] Phone: [phone]   Fax:  [phone]  Name: Sophia Rodriguez MRN: [id number] Date of Birth: 07/08/1950

## 2016-12-19 ENCOUNTER — Ambulatory Visit: Payer: Medicare Other | Admitting: Physical Therapy

## 2016-12-19 ENCOUNTER — Encounter: Payer: Self-pay | Admitting: Physical Therapy

## 2016-12-19 DIAGNOSIS — R2681 Unsteadiness on feet: Secondary | ICD-10-CM

## 2016-12-19 DIAGNOSIS — R2689 Other abnormalities of gait and mobility: Secondary | ICD-10-CM

## 2016-12-19 DIAGNOSIS — M6281 Muscle weakness (generalized): Secondary | ICD-10-CM

## 2016-12-19 NOTE — Patient Instructions (Signed)
  Feet Together, Head Motion - Eyes Closed      With eyes closed and feet together, hold for 30 seconds (no head movement), then move head slowly, up and down, then left and right. Repeat 10 times per session.

## 2016-12-19 NOTE — Therapy (Signed)
[facility] [address] [place], [place], [address] Phone: [phone]   Fax:  [phone]  Physical Therapy Treatment  Patient Details  Name: Sophia Rodriguez MRN: [id number] Date of Birth: 08-25-1950 Referring Provider: Dr. Delice Lesch  Encounter Date: 12/19/2016      PT End of Session - 12/19/16 1035    Visit Number 18  G code due on visit number 24   Number of Visits 23   Date for PT Re-Evaluation 01/02/17  Renewal completed 12-04-16 for 4 additional weeks   Authorization Type Medicare   Authorization Time Period 12-04-16 - 02-02-17   PT Start Time 0904  arrived late due to transportation   PT Stop Time 0945   PT Time Calculation (min) 41 min   Equipment Utilized During Treatment Gait belt   Activity Tolerance Patient tolerated treatment well   Behavior During Therapy [facility] for tasks assessed/performed      Past Medical History:  Diagnosis Date  . Arthritis   . Diabetes mellitus   . Hypertension   . Stroke [facility])     Past Surgical History:  Procedure Laterality Date  . HIP ARTHROPLASTY Right 11/24/2012   Procedure: RIGHT HIP HEMIARTHROPLASTY;  Surgeon: Augustin Schooling, MD;  Location: [facility];  Service: Orthopedics;  Laterality: Right;  hemiatroplasty, DePuy Triloc    There were no vitals filed for this visit.      Subjective Assessment - 12/19/16 0906    Subjective A little sore from last visit (rt hip). Thinks it's from doing the stairs. Did push the cart at the grocery store last Saturday. Made it a good ways through the store, but when cart got too heavy she switched to the electric cart.    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 2014); spasticity:  DM Type II   Patient Stated Goals be able to walk with the cane "and to use in the community if I can get that far"   Currently in Pain? Yes   Pain Score 1    Pain Location  Hip   Pain Orientation Right   Pain Descriptors / Indicators Sore   Pain Type Acute pain   Pain Onset In the past 7 days   Pain Frequency Intermittent   Pain Relieving Factors motrin                         [facility] Adult PT Treatment/Exercise - 12/18/16 1014      Gait training- with SPC and rubber quad tip, Rt AFO and leather toe cap x 400 ft indoors and outdoors (sidewalk with several up and down slopes) with seated rest x 1 after ~200 ft. Continues to need max cues and faciliation to improve Rt step length, Rt foot clearance, and Rt heelstrike. When performs very slow, exaggerate gait with SPC and HHA on Rt, she is able to improve all these features, but does not maintain as she returns to her self-selected velocity. Worked on same slow, exaggerated gait with RW as well (for her to be able to practice at home when alone).   Therapeutic activities/balance training-corner exercises with feet shoulder width, EC, including head turns and nods with no LOB. Progressed to feet only 1 inch apart and able to hold x 30 seconds with incr sway. With head turns/nods she had LOB with need to reach for chair/UE  support x 5. Updated HEP to include this exercise.  Sit to stand from mat with feet on blue mat (not airex) x 4 for balance and strengthening. (Limited due to pt needing to leave for her transportation)          PT Education - 12/19/16 1034    Education provided Yes   Education Details see addition to HEP; emphasized spending at least 5 min per day slowing her walking (with RW) and focus on Rt foot clearance and heel strike   Person(s) Educated Patient   Methods Explanation;Demonstration;Verbal cues;Handout   Comprehension Verbalized understanding;Returned demonstration;Need further instruction             PT Long Term Goals - 12/04/16 0945      PT LONG TERM GOAL #1   Title Independent in updated HEP as appropriate for LE strengthening and balance.   Baseline met  11-04-16   Time 4   Period Weeks   Status Achieved   Target Date 12/05/16     PT LONG TERM GOAL #2   Title Pt will improve BERG balance score to >/= 45/56 in order to indicate decreased fall risk.     Baseline 44/56 on 12-04-16   Time 4   Period Weeks   Status On-going   Target Date 01/02/17     PT LONG TERM GOAL #3   Title Pt will improve gait speed from 1.23 ft/sec with RW to >/= 1.8 ft/sec in order to indicate decreased fall risk and improved efficiency of gait.    Baseline 1.23 ft/sec with RW on 10-01-16; 22.31= 1.47 ft/sec with RW; 18.3 secs = 1.79 ft/sec = 1.8 ft/sec with cane   Time 4   Period Weeks   Status Achieved   Target Date 12/05/16     PT LONG TERM GOAL #4   Title Pt will ambulate 115' on flat, even surface with R AFO and SPC with rubber quad tip with supervision for incr. household accessibility.       Baseline pt amb. 345' with SPC with rubber quad tip with CGA; Rt leg and foot drags more with fatigue   Time 4   Period Weeks   Status Achieved   Target Date 12/05/16     PT LONG TERM GOAL #5   Title Pt will transfer floor to stand with UE support with min assist for fall recovery as needed.     Baseline mod assist - 11-04-16   Time 4   Period Weeks   Status On-going   Target Date 01/02/17     PT LONG TERM GOAL #6   Title Improve TUG score from 30.16 secs with cane to </= 21secs with cane with SBA to demo improved functional mobility.   TARGET DATE    Baseline 38.19 secs with cane - assessed at end of session and pt was fatigued - will reassess this goal at start of next session to determine score when pt not fatigued   Time 4   Period Weeks   Status On-going   Target Date 01/02/17     PT LONG TERM GOAL #7   Title Pt will amb. with SPC with rubber quad tip 500' with SBA to demo increased endurance for incr. community accessibility with use of cane.   Baseline 345' with SPC   Time 4   Period Weeks   Status New   Target Date 01/02/17     PT LONG TERM GOAL  #8   Title Modified indepedent  household amb. with SPC at least 25% of time during the day.   Baseline pt using RW at all times unless she has someone there to provide supervision   Time 4   Period Weeks   Status New   Target Date 01/02/17               Plan - 12/19/16 1037    Clinical Impression Statement Session focused on gait training (including outdoors) with SPC and with RW. Also balance training. Patient is faithfully doing her HEP and slowly making progress. Anticipate she will need to utilze slight steppage gait to consistently clear rt foot as she already uses an AFO and shoe toe cap. Patient will continue to benefit from further PT.    Rehab Potential Good   Clinical Impairments Affecting Rehab Potential severity of impairments and length of time since CVA (4 yrs); dense Rt hemiplegia   PT Frequency 2x / week   PT Duration 4 weeks  Renewal completed 01-03-17 for 4 additional weeks   PT Treatment/Interventions ADLs/Self Care Home Management;DME Instruction;Gait training;Stair training;Functional mobility training;Therapeutic activities;Therapeutic exercise;Balance training;Neuromuscular re-education;Patient/family education   PT Next Visit Plan continue RLE strengthening and gait with cane, R NMR; ?begin teaching slight steppage gait to improve rt foot clearance??   PT Home Exercise Plan balance, strengthening , stretching for RLE   Consulted and Agree with Plan of Care Patient      Patient will benefit from skilled therapeutic intervention in order to improve the following deficits and impairments:  Abnormal gait, Decreased balance, Decreased coordination, Decreased range of motion, Decreased strength, Impaired tone, Impaired UE functional use, Decreased activity tolerance  Visit Diagnosis: Other abnormalities of gait and mobility  Muscle weakness (generalized)  Unsteadiness on feet     Problem List Patient Active Problem List   Diagnosis Date Noted  . Muscle  spasticity 10/04/2015  . 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 Right hemiparesis ([place]) 05/10/2014  . Abnormality of gait 05/10/2014  . Closed fracture of right hip ([place]) 11/24/2012  . 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 Stroke ([place])   . Hypertension     Rexanne Mano, PT 12/19/2016, 10:41 AM  [facility] [address] [place], [place], [address] Phone: [phone]   Fax:  [phone]  Name: Sophia Rodriguez MRN: [id number] Date of Birth: Apr 20, 1950

## 2016-12-23 ENCOUNTER — Ambulatory Visit: Payer: Medicare Other | Attending: Physical Medicine & Rehabilitation | Admitting: Physical Therapy

## 2016-12-23 DIAGNOSIS — R2689 Other abnormalities of gait and mobility: Secondary | ICD-10-CM

## 2016-12-23 DIAGNOSIS — M6281 Muscle weakness (generalized): Secondary | ICD-10-CM | POA: Diagnosis present

## 2016-12-23 DIAGNOSIS — R2681 Unsteadiness on feet: Secondary | ICD-10-CM | POA: Insufficient documentation

## 2016-12-23 DIAGNOSIS — I69951 Hemiplegia and hemiparesis following unspecified cerebrovascular disease affecting right dominant side: Secondary | ICD-10-CM | POA: Insufficient documentation

## 2016-12-23 NOTE — Therapy (Signed)
[facility] [address] [place], [place], [address] Phone: [phone]   Fax:  [phone]  Physical Therapy Treatment  Patient Details  Name: Sophia Rodriguez MRN: [id number] Date of Birth: 1950-06-22 Referring Provider: Dr. Delice Lesch  Encounter Date: 12/23/2016      PT End of Session - 12/23/16 2034    Visit Number 19  G5- G code due at visit 24   Number of Visits 23   Date for PT Re-Evaluation 01/02/17   Authorization Type Medicare   Authorization Time Period 12-04-16 - 02-02-17   PT Start Time 0847   PT Stop Time 0932   PT Time Calculation (min) 45 min   Equipment Utilized During Treatment Gait belt      Past Medical History:  Diagnosis Date  . Arthritis   . Diabetes mellitus   . Hypertension   . Stroke [facility])     Past Surgical History:  Procedure Laterality Date  . HIP ARTHROPLASTY Right 11/24/2012   Procedure: RIGHT HIP HEMIARTHROPLASTY;  Surgeon: Augustin Schooling, MD;  Location: [facility];  Service: Orthopedics;  Laterality: Right;  hemiatroplasty, DePuy Triloc    There were no vitals filed for this visit.      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 2014); spasticity:  DM Type II   Patient Stated Goals be able to walk with the cane "and to use in the community if I can get that far"   Currently in Pain? No/denies                         [facility] Adult PT Treatment/Exercise - 12/23/16 0859      Transfers   Transfers Sit to Stand;Stand to Sit;Floor to Transfer   Sit to Stand 5: Supervision   Stand to Sit 5: Supervision   Number of Reps 10 reps   Comments no UE support used     Ambulation/Gait   Ambulation/Gait Yes   Ambulation/Gait  Assistance 5: Supervision   Ambulation/Gait Assistance Details cues to lift RLE to prevent scuffing and to increase step length   Ambulation Distance (Feet) 300 Feet  1 1/2 laps prior to needing seated rest period   Assistive device Straight cane  with ruber quad tip   Gait Pattern Step-through pattern;Decreased step length - right;Decreased step length - left;Decreased stance time - right;Decreased stride length;Decreased hip/knee flexion - right;Decreased dorsiflexion - right;Decreased trunk rotation;Poor foot clearance - right   Ambulation Surface Level;Indoor     Neuro Re-ed    Neuro Re-ed Details  Pt performed standing balance exercises at counter - marching in place, sidestepping and then amb. forward and backward 3 reps alon gcounter with min hand held assist only (no UE support on counter):  pt performed alternate tap ups to 6" step 10 reps with each leg;  ladder on floor to increase step length - 3 reps with min to mod hand held assist with use of cane in left hand; stepping over and back of blue rung of ladder with LLE to improve RLE SLS  Lumbar Exercises: Machines for Strengthening   Leg Press 40# bil. LE's 10 reps:  RLE only 30# 20 reps     Knee/Hip Exercises: Aerobic   Recumbent Bike SciFIt level 2.0 x 4"  pt fatigued at 3.5" - encouraged pt to cont to 4"                     PT Long Term Goals - 12/04/16 0945      PT LONG TERM GOAL #1   Title Independent in updated HEP as appropriate for LE strengthening and balance.   Baseline met 11-04-16   Time 4   Period Weeks   Status Achieved   Target Date 12/05/16     PT LONG TERM GOAL #2   Title Pt will improve BERG balance score to >/= 45/56 in order to indicate decreased fall risk.     Baseline 44/56 on 12-04-16   Time 4   Period Weeks   Status On-going   Target Date 01/02/17     PT LONG TERM GOAL #3   Title Pt will improve gait speed from 1.23 ft/sec with RW to  >/= 1.8 ft/sec in order to indicate decreased fall risk and improved efficiency of gait.    Baseline 1.23 ft/sec with RW on 10-01-16; 22.31= 1.47 ft/sec with RW; 18.3 secs = 1.79 ft/sec = 1.8 ft/sec with cane   Time 4   Period Weeks   Status Achieved   Target Date 12/05/16     PT LONG TERM GOAL #4   Title Pt will ambulate 115' on flat, even surface with R AFO and SPC with rubber quad tip with supervision for incr. household accessibility.       Baseline pt amb. 345' with SPC with rubber quad tip with CGA; Rt leg and foot drags more with fatigue   Time 4   Period Weeks   Status Achieved   Target Date 12/05/16     PT LONG TERM GOAL #5   Title Pt will transfer floor to stand with UE support with min assist for fall recovery as needed.     Baseline mod assist - 11-04-16   Time 4   Period Weeks   Status On-going   Target Date 01/02/17     PT LONG TERM GOAL #6   Title Improve TUG score from 30.16 secs with cane to </= 21secs with cane with SBA to demo improved functional mobility.   TARGET DATE    Baseline 38.19 secs with cane - assessed at end of session and pt was fatigued - will reassess this goal at start of next session to determine score when pt not fatigued   Time 4   Period Weeks   Status On-going   Target Date 01/02/17     PT LONG TERM GOAL #7   Title Pt will amb. with SPC with rubber quad tip 500' with SBA to demo increased endurance for incr. community accessibility with use of cane.   Baseline 345' with SPC   Time 4   Period Weeks   Status New   Target Date 01/02/17     PT LONG TERM GOAL #8   Title Modified indepedent household amb. with SPC at least 25% of time during the day.   Baseline pt using RW at all times unless she has someone there to provide supervision   Time 4   Period Weeks   Status New   Target Date 01/02/17  Plan - 12/23/16 2037    Clinical Impression Statement Pt able to increase gait speed and improve clearance of Rt foot  when she is not fatigued; endurance is slowly improving with pt able to amb. approx. 300' prior to needing seated rest period.  Pt also demonstrating increased RLE strength as she is now able to transfer sit to stand without UE support from mat.     Rehab Potential Good   Clinical Impairments Affecting Rehab Potential severity of impairments and length of time since CVA (4 yrs); dense Rt hemiplegia   PT Frequency 2x / week   PT Duration 4 weeks   PT Treatment/Interventions ADLs/Self Care Home Management;DME Instruction;Gait training;Stair training;Functional mobility training;Therapeutic activities;Therapeutic exercise;Balance training;Neuromuscular re-education;Patient/family education   PT Next Visit Plan cont RLE strength; gait and balance training   PT Home Exercise Plan balance, strengthening , stretching for RLE   Consulted and Agree with Plan of Care Patient      Patient will benefit from skilled therapeutic intervention in order to improve the following deficits and impairments:  Abnormal gait, Decreased balance, Decreased coordination, Decreased range of motion, Decreased strength, Impaired tone, Impaired UE functional use, Decreased activity tolerance  Visit Diagnosis: Other abnormalities of gait and mobility  Muscle weakness (generalized)  Unsteadiness on feet     Problem List Patient Active Problem List   Diagnosis Date Noted  . Muscle spasticity 10/04/2015  . 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 Right hemiparesis ([place]) 05/10/2014  . Abnormality of gait 05/10/2014  . Closed fracture of right hip ([place]) 11/24/2012  . Thrombotic cerebral infarction ([place]) 09/23/2012  . 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 Stroke ([place])   . Hypertension     Azad Calame, [place], PT 12/23/2016, 8:45 PM  [facility] [address] [place], [place], [address] Phone:  [phone]   Fax:  [phone]  Name: Sophia Rodriguez MRN: [id number] Date of Birth: Feb 23, 1951

## 2016-12-26 ENCOUNTER — Ambulatory Visit: Payer: Medicare Other | Admitting: Physical Therapy

## 2016-12-26 ENCOUNTER — Encounter: Payer: Self-pay | Admitting: Physical Therapy

## 2016-12-26 DIAGNOSIS — R2689 Other abnormalities of gait and mobility: Secondary | ICD-10-CM

## 2016-12-26 DIAGNOSIS — I69951 Hemiplegia and hemiparesis following unspecified cerebrovascular disease affecting right dominant side: Secondary | ICD-10-CM

## 2016-12-26 DIAGNOSIS — M6281 Muscle weakness (generalized): Secondary | ICD-10-CM

## 2016-12-26 DIAGNOSIS — R2681 Unsteadiness on feet: Secondary | ICD-10-CM

## 2016-12-28 NOTE — Therapy (Signed)
[facility] [address] [place], [place], [address] Phone: [phone]   Fax:  [phone]  Physical Therapy Treatment  Patient Details  Name: Sophia Rodriguez MRN: [id number] Date of Birth: 09/19/1950 Referring Provider: Dr. Delice Lesch  Encounter Date: 12/26/2016     12/26/16 0852  PT Visits / Re-Eval  Visit Number 20 ( G code due at visit 24)  Number of Visits 23  Date for PT Re-Evaluation 01/02/17  Authorization  Authorization Type Medicare  Authorization Time Period 12-04-16 - 02-02-17  PT Time Calculation  PT Start Time 0848  PT Stop Time 0930  PT Time Calculation (min) 42 min  PT - End of Session  Equipment Utilized During Treatment Gait belt    Past Medical History:  Diagnosis Date  . Arthritis   . Diabetes mellitus   . Hypertension   . Stroke [facility])     Past Surgical History:  Procedure Laterality Date  . HIP ARTHROPLASTY Right 11/24/2012   Procedure: RIGHT HIP HEMIARTHROPLASTY;  Surgeon: Augustin Schooling, MD;  Location: [facility];  Service: Orthopedics;  Laterality: Right;  hemiatroplasty, DePuy Triloc    There were no vitals filed for this visit.      12/26/16 0851  Symptoms/Limitations  Subjective No new compliants. Reports her driver commented that she is walking better. No falls or pain to report.   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 2014); spasticity:  DM Type II  Patient Stated Goals be able to walk with the cane "and to use in the community if I can get that far"  Pain Assessment  Currently in Pain? No/denies  Pain Score 0     12/26/16 0852  Transfers  Transfers Sit to Stand;Stand to Sit  Sit to Stand 5: Supervision  Stand to Sit 5: Supervision  Ambulation/Gait  Ambulation/Gait Yes  Ambulation/Gait Assistance 5: Supervision  Ambulation/Gait Assistance Details no foot/toe scuffing noted with gait  today, no imbalance with gait today.   Ambulation Distance (Feet) 420 Feet (x1)  Assistive device Straight cane (with rubber quad tip)  Gait Pattern Step-through pattern;Decreased step length - right;Decreased step length - left;Decreased stance time - right;Decreased stride length;Poor foot clearance - right  Ambulation Surface Level;Indoor  Knee/Hip Exercises: Aerobic  Recumbent Bike Scifit level 2.0 with LE's (intermittent use of UE's) for 6 minutes with goal rpm >/= 30   Knee/Hip Exercises: Machines for Strengthening  Cybex Leg Press right leg only, 25# 2 sets of 10 reps with cues for full extension and slow, controlled movements  Knee/Hip Exercises: Standing  Lateral Step Up Right;1 set;5 reps;Hand Hold: 2;Step Height: 6";Limitations  Lateral Step Up Limitations assist/cues needed to achieve full knee/hip extension, min to mod assist as reps progressed.   Forward Step Up Right;1 set;10 reps;Step Height: 2";Step Height: 6";Limitations  Forward Step Up Limitations using bottom step with bil UE support, right step ups with emphasis on full hip/knee extension with manual overpressure needed with increased assistance as reps progressed due to fatigue  Other Standing Knee Exercises at bottom of stairs: right foot taps up/down bottom 2 steps x 10 reps with cues/emphasis on lifting foot each rep, not sliding it along          PT Long Term Goals - 12/04/16 0945      PT LONG TERM GOAL #1   Title Independent in updated HEP as appropriate for  LE strengthening and balance.   Baseline met 11-04-16   Time 4   Period Weeks   Status Achieved   Target Date 12/05/16     PT LONG TERM GOAL #2   Title Pt will improve BERG balance score to >/= 45/56 in order to indicate decreased fall risk.     Baseline 44/56 on 12-04-16   Time 4   Period Weeks   Status On-going   Target Date 01/02/17     PT LONG TERM GOAL #3   Title Pt will improve gait speed from 1.23 ft/sec with RW to >/= 1.8 ft/sec in order  to indicate decreased fall risk and improved efficiency of gait.    Baseline 1.23 ft/sec with RW on 10-01-16; 22.31= 1.47 ft/sec with RW; 18.3 secs = 1.79 ft/sec = 1.8 ft/sec with cane   Time 4   Period Weeks   Status Achieved   Target Date 12/05/16     PT LONG TERM GOAL #4   Title Pt will ambulate 115' on flat, even surface with R AFO and SPC with rubber quad tip with supervision for incr. household accessibility.       Baseline pt amb. 345' with SPC with rubber quad tip with CGA; Rt leg and foot drags more with fatigue   Time 4   Period Weeks   Status Achieved   Target Date 12/05/16     PT LONG TERM GOAL #5   Title Pt will transfer floor to stand with UE support with min assist for fall recovery as needed.     Baseline mod assist - 11-04-16   Time 4   Period Weeks   Status On-going   Target Date 01/02/17     PT LONG TERM GOAL #6   Title Improve TUG score from 30.16 secs with cane to </= 21secs with cane with SBA to demo improved functional mobility.   TARGET DATE    Baseline 38.19 secs with cane - assessed at end of session and pt was fatigued - will reassess this goal at start of next session to determine score when pt not fatigued   Time 4   Period Weeks   Status On-going   Target Date 01/02/17     PT LONG TERM GOAL #7   Title Pt will amb. with SPC with rubber quad tip 500' with SBA to demo increased endurance for incr. community accessibility with use of cane.   Baseline 345' with SPC   Time 4   Period Weeks   Status New   Target Date 01/02/17     PT LONG TERM GOAL #8   Title Modified indepedent household amb. with SPC at least 25% of time during the day.   Baseline pt using RW at all times unless she has someone there to provide supervision   Time 4   Period Weeks   Status New   Target Date 01/02/17        12/26/16 0347  Plan  Clinical Impression Statement Today's session continued to focus on LE strengthening and gait with cane with no issues reported. Pt is  progressing toward goals and should benefit from continued PT to progress toward unmet goals.   Pt will benefit from skilled therapeutic intervention in order to improve on the following deficits Abnormal gait;Decreased balance;Decreased coordination;Decreased range of motion;Decreased strength;Impaired tone;Impaired UE functional use;Decreased activity tolerance  Rehab Potential Good  Clinical Impairments Affecting Rehab Potential severity of impairments and length of time since CVA (4 yrs);  dense Rt hemiplegia  PT Frequency 2x / week  PT Duration 4 weeks  PT Treatment/Interventions ADLs/Self Care Home Management;DME Instruction;Gait training;Stair training;Functional mobility training;Therapeutic activities;Therapeutic exercise;Balance training;Neuromuscular re-education;Patient/family education  PT Next Visit Plan cont RLE strength; gait and balance training  PT Home Exercise Plan balance, strengthening , stretching for RLE  Consulted and Agree with Plan of Care Patient          Patient will benefit from skilled therapeutic intervention in order to improve the following deficits and impairments:  Abnormal gait, Decreased balance, Decreased coordination, Decreased range of motion, Decreased strength, Impaired tone, Impaired UE functional use, Decreased activity tolerance  Visit Diagnosis: Other abnormalities of gait and mobility  Muscle weakness (generalized)  Unsteadiness on feet  Hemiplegia and hemiparesis following unspecified cerebrovascular disease affecting right dominant side [facility])     Problem List Patient Active Problem List   Diagnosis Date Noted  . Muscle spasticity 10/04/2015  . 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 Right hemiparesis ([place]) 05/10/2014  . Abnormality of gait 05/10/2014  . Closed fracture of right hip ([place]) 11/24/2012  . Thrombotic cerebral infarction ([place]) 09/23/2012  . DM type 2 causing neurological disease ([place])  08/26/2012  . Stroke ([place])   . Hypertension     [place], [place], [place] [address] [place], [place] [address] [phone] 12/28/16, 9:30 PM   Name: Sophia Rodriguez MRN: [id number] Date of Birth: [birth year]

## 2016-12-30 ENCOUNTER — Ambulatory Visit: Payer: Medicare Other | Admitting: Physical Therapy

## 2016-12-30 DIAGNOSIS — R2689 Other abnormalities of gait and mobility: Secondary | ICD-10-CM | POA: Diagnosis not present

## 2016-12-30 DIAGNOSIS — M6281 Muscle weakness (generalized): Secondary | ICD-10-CM

## 2016-12-30 NOTE — Therapy (Signed)
[facility] [address] [place], [place], [address] Phone: [phone]   Fax:  [phone]  Physical Therapy Treatment  Patient Details  Name: Sophia Rodriguez MRN: [id number] Date of Birth: Sep 14, 1950 Referring Provider: Dr. Delice Lesch  Encounter Date: 12/30/2016      PT End of Session - 12/31/16 1333    Visit Number 21  G code due visit 24   Number of Visits 23   Date for PT Re-Evaluation 01/02/17   Authorization Type Medicare   Authorization Time Period 12-04-16 - 02-02-17   PT Start Time 0856  pt arrived 10" late   PT Stop Time 0930   PT Time Calculation (min) 34 min      Past Medical History:  Diagnosis Date  . Arthritis   . Diabetes mellitus   . Hypertension   . Stroke [facility] [place])     Past Surgical History:  Procedure Laterality Date  . HIP ARTHROPLASTY Right 11/24/2012   Procedure: RIGHT HIP HEMIARTHROPLASTY;  Surgeon: Augustin Schooling, MD;  Location: [facility];  Service: Orthopedics;  Laterality: Right;  hemiatroplasty, DePuy Triloc    There were no vitals filed for this visit.      Subjective Assessment - 12/31/16 1316    Subjective No c/o's or changes reported -   Pertinent History h/o CVA's x 3 with most recent one in 2014; Type II DM:  s/p Rt hip hemiarthroplasty due to fall (Sept. 2014); spasticity:  DM Type II   Patient Stated Goals be able to walk with the cane "and to use in the community if I can get that far"   Currently in Pain? No/denies        gait  Sit to stand with balance buble Stepping over cane Touching bubble with left foot                  [facility] Adult PT Treatment/Exercise - 12/31/16 0001      Transfers   Transfers Sit to Stand;Stand to Sit   Sit to Stand 5: Supervision   Stand to Sit 5: Supervision     Ambulation/Gait   Ambulation/Gait Yes   Ambulation/Gait Assistance 5: Supervision   Ambulation/Gait Assistance Details Pt used tripod based cane for  assistance with amb.   Ambulation Distance (Feet) 400 Feet   Assistive device Straight cane  with tripod base tip   Gait Pattern Step-through pattern;Decreased step length - right;Decreased step length - left;Decreased stance time - right;Decreased stride length;Poor foot clearance - right   Ambulation Surface Level;Indoor   Stairs Yes   Stairs Assistance 4: Min guard   Stairs Assistance Details (indicate cue type and reason) step by step sequence    Stair Management Technique Two rails   Number of Stairs 4   Height of Stairs 6     Neuro Re-ed    Neuro Re-ed Details  Pt performed alternate tap ups to 6" step with min hand held assist- 10 reps each leg; stepping over and back of cane on floor - 5 reps each leg with min hand held assist     Lumbar Exercises: Stretches   Active Hamstring Stretch 1 rep;30 seconds  RLE     Lumbar Exercises: Machines for Strengthening   Leg Press 40# bil. LE's 10 reps:  RLE only 30# 20 reps                     PT Long Term Goals - 12/04/16 0945  PT LONG TERM GOAL #1   Title Independent in updated HEP as appropriate for LE strengthening and balance.   Baseline met 11-04-16   Time 4   Period Weeks   Status Achieved   Target Date 12/05/16     PT LONG TERM GOAL #2   Title Pt will improve BERG balance score to >/= 45/56 in order to indicate decreased fall risk.     Baseline 44/56 on 12-04-16   Time 4   Period Weeks   Status On-going   Target Date 01/02/17     PT LONG TERM GOAL #3   Title Pt will improve gait speed from 1.23 ft/sec with RW to >/= 1.8 ft/sec in order to indicate decreased fall risk and improved efficiency of gait.    Baseline 1.23 ft/sec with RW on 10-01-16; 22.31= 1.47 ft/sec with RW; 18.3 secs = 1.79 ft/sec = 1.8 ft/sec with cane   Time 4   Period Weeks   Status Achieved   Target Date 12/05/16     PT LONG TERM GOAL #4   Title Pt will ambulate 115' on flat, even surface with R AFO and SPC with rubber quad tip  with supervision for incr. household accessibility.       Baseline pt amb. 345' with SPC with rubber quad tip with CGA; Rt leg and foot drags more with fatigue   Time 4   Period Weeks   Status Achieved   Target Date 12/05/16     PT LONG TERM GOAL #5   Title Pt will transfer floor to stand with UE support with min assist for fall recovery as needed.     Baseline mod assist - 11-04-16   Time 4   Period Weeks   Status On-going   Target Date 01/02/17     PT LONG TERM GOAL #6   Title Improve TUG score from 30.16 secs with cane to </= 21secs with cane with SBA to demo improved functional mobility.   TARGET DATE    Baseline 38.19 secs with cane - assessed at end of session and pt was fatigued - will reassess this goal at start of next session to determine score when pt not fatigued   Time 4   Period Weeks   Status On-going   Target Date 01/02/17     PT LONG TERM GOAL #7   Title Pt will amb. with SPC with rubber quad tip 500' with SBA to demo increased endurance for incr. community accessibility with use of cane.   Baseline 345' with SPC   Time 4   Period Weeks   Status New   Target Date 01/02/17     PT LONG TERM GOAL #8   Title Modified indepedent household amb. with SPC at least 25% of time during the day.   Baseline pt using RW at all times unless she has someone there to provide supervision   Time 4   Period Weeks   Status New   Target Date 01/02/17               Plan - 12/31/16 1335    Clinical Impression Statement Pt progressing towards LTG's - pt reported she could not tell any difference with amb. with rubber quad tip vs. tripod base on SPC   Rehab Potential Good   Clinical Impairments Affecting Rehab Potential severity of impairments and length of time since CVA (4 yrs); dense Rt hemiplegia   PT Frequency 2x / week   PT Duration  4 weeks   PT Treatment/Interventions ADLs/Self Care Home Management;DME Instruction;Gait training;Stair training;Functional mobility  training;Therapeutic activities;Therapeutic exercise;Balance training;Neuromuscular re-education;Patient/family education   PT Next Visit Plan cont RLE strength; gait and balance training   PT Home Exercise Plan balance, strengthening , stretching for RLE   Consulted and Agree with Plan of Care Patient      Patient will benefit from skilled therapeutic intervention in order to improve the following deficits and impairments:  Abnormal gait, Decreased balance, Decreased coordination, Decreased range of motion, Decreased strength, Impaired tone, Impaired UE functional use, Decreased activity tolerance  Visit Diagnosis: Other abnormalities of gait and mobility  Muscle weakness (generalized)     Problem List Patient Active Problem List   Diagnosis Date Noted  . Muscle spasticity 10/04/2015  . 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 Right hemiparesis ([place]) 05/10/2014  . Abnormality of gait 05/10/2014  . Closed fracture of right hip ([place]) 11/24/2012  . 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 Stroke ([place])   . Hypertension     Imad Shostak, [place], PT 12/31/2016, 1:45 PM  [facility] [address] [place], [place], [address] Phone: [phone]   Fax:  [phone]  Name: Sophia Rodriguez MRN: [id number] Date of Birth: 04/06/1950

## 2017-01-01 ENCOUNTER — Ambulatory Visit: Payer: Medicare Other | Admitting: Physical Therapy

## 2017-01-01 DIAGNOSIS — R2689 Other abnormalities of gait and mobility: Secondary | ICD-10-CM

## 2017-01-01 DIAGNOSIS — M6281 Muscle weakness (generalized): Secondary | ICD-10-CM

## 2017-01-01 NOTE — Therapy (Signed)
[facility] [address] [place], [place], [address] Phone: [phone]   Fax:  [phone]  Physical Therapy Treatment  Patient Details  Name: Sophia Rodriguez MRN: [id number] Date of Birth: September 02, 1950 Referring Provider: Dr. Delice Lesch  Encounter Date: 01/01/2017      PT End of Session - 01/01/17 1349    Visit Number 22  G code due visit 24   Number of Visits 24   Date for PT Re-Evaluation 01/09/17   Authorization Type Medicare   Authorization Time Period 12-04-16 - 02-02-17   PT Start Time 0848   PT Stop Time 0932   PT Time Calculation (min) 44 min      Past Medical History:  Diagnosis Date  . Arthritis   . Diabetes mellitus   . Hypertension   . Stroke [facility])     Past Surgical History:  Procedure Laterality Date  . HIP ARTHROPLASTY Right 11/24/2012   Procedure: RIGHT HIP HEMIARTHROPLASTY;  Surgeon: Augustin Schooling, MD;  Location: [facility];  Service: Orthopedics;  Laterality: Right;  hemiatroplasty, DePuy Triloc    There were no vitals filed for this visit.      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 2014); spasticity:  DM Type II   Patient Stated Goals be able to walk with the cane "and to use in the community if I can get that far"   Currently in Pain? No/denies                    sit to stand On blue 2# weight - hip exs Walking no hnads Sidestepping  "   With squats  Lifting LLE up Ledell Peoples      [facility] Adult PT Treatment/Exercise - 01/01/17 0001      Transfers   Transfers Sit to Stand;Stand to Sit   Sit to Stand 4: Min guard   Stand to Sit 4: Min guard   Comments no UE support - 3 reps on floor:  3 reps on blue balance beam     Ambulation/Gait   Ambulation/Gait  Yes   Ambulation/Gait Assistance 5: Supervision   Ambulation/Gait Assistance Details SPC with rubber quad tip   Ambulation Distance (Feet) 200 Feet  2 reps   Assistive device Straight cane  with rubber quad  tip   Gait Pattern Step-through pattern;Decreased step length - right;Decreased step length - left;Decreased stance time - right;Decreased stride length;Poor foot clearance - right   Ambulation Surface Level;Indoor     Neuro Re-ed    Neuro Re-ed Details  Pt performed tap downs to floor with Lt foot while standing on blue balance beam;     Knee/Hip Exercises: Standing   Heel Raises Both;1 set;10 reps;2 seconds   Hip Flexion Stengthening;Right;1 set;10 reps  3# weight   Hip Abduction Stengthening;Right;1 set;10 reps  3# weight    Hip Extension Stengthening;Right;1 set;10 reps  3# weight    walking 1 lap - 1 lap rest - 1 lap  Nustep        Balance Exercises - 01/01/17 1348      Balance Exercises: Standing   Sidestepping 2 reps;Other (comment)  2nd rep with squats (inside // bars)  PT Long Term Goals - 01/01/17 1353      PT LONG TERM GOAL #1   Title Independent in updated HEP as appropriate for LE strengthening and balance.   Baseline met 11-04-16   Time 4   Period Weeks   Status Achieved   Target Date 01/09/17     PT LONG TERM GOAL #2   Title Pt will improve BERG balance score to >/= 45/56 in order to indicate decreased fall risk.     Baseline 44/56 on 12-04-16   Time 4   Period Weeks   Status On-going   Target Date 01/09/17     PT LONG TERM GOAL #3   Title Pt will improve gait speed from 1.23 ft/sec with RW to >/= 1.8 ft/sec in order to indicate decreased fall risk and improved efficiency of gait.    Baseline 1.23 ft/sec with RW on 10-01-16; 22.31= 1.47 ft/sec with RW; 18.3 secs = 1.79 ft/sec = 1.8 ft/sec with cane   Time 4   Period Weeks   Status Achieved     PT LONG TERM GOAL #4   Title Pt will ambulate 115' on flat, even surface  with R AFO and SPC with rubber quad tip with supervision for incr. household accessibility.       Baseline pt amb. 345' with SPC with rubber quad tip with CGA; Rt leg and foot drags more with fatigue   Time 4   Period Weeks   Status Achieved     PT LONG TERM GOAL #5   Title Pt will transfer floor to stand with UE support with min assist for fall recovery as needed.     Baseline mod assist - 11-04-16   Time 4   Period Weeks   Status On-going   Target Date 01/09/17     PT LONG TERM GOAL #6   Title Improve TUG score from 30.16 secs with cane to </= 21secs with cane with SBA to demo improved functional mobility.   TARGET DATE    Baseline 38.19 secs with cane - assessed at end of session and pt was fatigued - will reassess this goal at start of next session to determine score when pt not fatigued   Time 4   Period Weeks   Status On-going   Target Date 01/09/17     PT LONG TERM GOAL #7   Title Pt will amb. with SPC with rubber quad tip 500' with SBA to demo increased endurance for incr. community accessibility with use of cane.   Baseline 345' with SPC   Time 4   Period Weeks   Status New   Target Date 01/09/17     PT LONG TERM GOAL #8   Title Modified indepedent household amb. with SPC at least 25% of time during the day.   Baseline pt using RW at all times unless she has someone there to provide supervision   Time 4   Period Weeks   Status New   Target Date 01/09/17               Plan - 01/01/17 1350    Clinical Impression Statement Pt reported much fatigue at end of today's session; requested not to do leg press exercise due to fatigue; pt's gait pattern is improving with less Rt foot drag noted with overall improved clearance; plan D/C next week   Rehab Potential Good   Clinical Impairments Affecting Rehab Potential severity of impairments and length of time since CVA (  4 yrs); dense Rt hemiplegia   PT Frequency 2x / week   PT Duration 4 weeks   PT  Treatment/Interventions ADLs/Self Care Home Management;DME Instruction;Gait training;Stair training;Functional mobility training;Therapeutic activities;Therapeutic exercise;Balance training;Neuromuscular re-education;Patient/family education   PT Next Visit Plan begin checking LTG's - plan D/C end of next week   PT Home Exercise Plan balance, strengthening , stretching for RLE   Consulted and Agree with Plan of Care Patient      Patient will benefit from skilled therapeutic intervention in order to improve the following deficits and impairments:  Abnormal gait, Decreased balance, Decreased coordination, Decreased range of motion, Decreased strength, Impaired tone, Impaired UE functional use, Decreased activity tolerance  Visit Diagnosis: Other abnormalities of gait and mobility  Muscle weakness (generalized)     Problem List Patient Active Problem List   Diagnosis Date Noted  . Muscle spasticity 10/04/2015  . 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 Right hemiparesis ([place]) 05/10/2014  . Abnormality of gait 05/10/2014  . Closed fracture of right hip ([place]) 11/24/2012  . 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 Stroke ([place])   . Hypertension     Joziah Dollins, [place], PT 01/01/2017, 1:57 PM  [facility] [address] [place], [place], [address] Phone: [phone]   Fax:  [phone]  Name: Kamani Magnussen MRN: [id number] Date of Birth: 20-Jul-1950

## 2017-01-06 ENCOUNTER — Ambulatory Visit: Payer: Medicare Other | Admitting: Physical Therapy

## 2017-01-06 DIAGNOSIS — M6281 Muscle weakness (generalized): Secondary | ICD-10-CM

## 2017-01-06 DIAGNOSIS — R2689 Other abnormalities of gait and mobility: Secondary | ICD-10-CM | POA: Diagnosis not present

## 2017-01-06 NOTE — Patient Instructions (Signed)
FUNCTIONAL MOBILITY: Marching - Standing    March in place by lifting left leg up, then right. Alternate. __10_ reps per set, _1__ sets per day, _5__ days per week Hold onto a support.  USE YELLOW THERABAND  Copyright  VHI. All rights reserved.  Knee Flexion: Resisted (Sitting)    Sit with band under left foot and looped around ankle of supported leg. Pull unsupported leg back. Repeat __10_ times per set. Do _1___ sets per session. Do _1___ sessions per day. USE YELLOW BAND http://orth.exer.us/695   Copyright  VHI. All rights reserved.  Strengthening: Hip Abduction - Resisted    With tubing around right leg, other side toward anchor, extend leg out from side. Repeat __10__ times per set. Do _1___ sets per session. Do _1__ sessions per day.  http://orth.exer.us/635   Copyright  VHI. All rights reserved.  Strengthening: Hip Extension - Resisted    With tubing around right ankle, face anchor and pull leg straight back. Repeat _10___ times per set. Do _1__ sets per session. Do __1__ sessions per day.  http://orth.exer.us/637   Copyright  VHI. All rights reserved.  Strengthening: Hip Flexion - Resisted    With tubing around left ankle, anchor behind, bring leg forward, keeping knee straight. Repeat _10___ times per set. Do _1_ sets per session. Do __5__ sessions per day.  http://orth.exer.us/639   Copyright  VHI. All rights reserved.

## 2017-01-06 NOTE — Therapy (Signed)
[facility] [address] [place], [place], [address] Phone: [phone]   Fax:  [phone]  Physical Therapy Treatment  Patient Details  Name: Sophia Rodriguez MRN: [id number] Date of Birth: [birth year] Referring Provider: Dr. Delice Lesch  Encounter Date: 01/06/2017      PT End of Session - 01/06/17 2103    Visit Number 23  G code due visit 24   Number of Visits 24   Date for PT Re-Evaluation 01/09/17   Authorization Type Medicare   Authorization Time Period 12-04-16 - 02-02-17   PT Start Time 0850   PT Stop Time 0932   PT Time Calculation (min) 42 min      Past Medical History:  Diagnosis Date  . Arthritis   . Diabetes mellitus   . Hypertension   . Stroke [facility])     Past Surgical History:  Procedure Laterality Date  . HIP ARTHROPLASTY Right 11/24/2012   Procedure: RIGHT HIP HEMIARTHROPLASTY;  Surgeon: Augustin Schooling, MD;  [facility];  Service: Orthopedics;  Laterality: Right;  hemiatroplasty, DePuy Triloc    There were no vitals filed for this visit.      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 2014); spasticity:  DM Type II   Patient Stated Goals be able to walk with the cane "and to use in the community if I can get that far"   Currently in Pain? No/denies                       [facility] Adult PT Treatment/Exercise - 01/06/17 0001      Ambulation/Gait   Ambulation/Gait Yes   Ambulation/Gait Assistance 5: Supervision   Ambulation/Gait Assistance Details SPC with rubber quad tip   Ambulation Distance (Feet) 315 Feet   Assistive device  Straight cane  with rubber quad  tip   Gait Pattern Step-through pattern;Decreased step length - right;Decreased step length - left;Decreased stance time - right;Decreased stride length;Poor foot clearance - right   Ambulation Surface Level;Indoor   Gait velocity 17.32 = 1.89 ft/sec with RW     Exercises   Other Exercises  Pt performed standing hip flexion, extension, abductin and marching with yellow theraband for resistane - 10 reps each leg     Lumbar Exercises: Machines for Strengthening   Leg Press 40# bil. LE's 10 reps:  RLE only 30# 20 reps     Knee/Hip Exercises: Seated   Hamstring Curl Strengthening;Right;1 set;10 reps  with yellow theraband     TherAct: Floor to stand transfer with mod assist with UE support on mat - cues for technique and correct position for transfer    Added hip strengthening exercises with yellow band to HEP (in standing) and seated Rt knee flexion with yellow theraband added to HEP           PT Long Term Goals - 01/06/17 0855      PT LONG TERM GOAL #1   Title Independent in updated HEP as appropriate for LE strengthening and balance.   Baseline met 01-06-17  Status Achieved     PT LONG TERM GOAL #2   Title Pt will improve BERG balance score to >/= 45/56 in order to indicate decreased fall risk.     Status On-going     PT LONG TERM GOAL #3   Title Pt will improve gait speed from 1.23 ft/sec with RW to >/= 1.8 ft/sec in order to indicate decreased fall risk and improved efficiency of gait.    Baseline 1.23 ft/sec with RW on 10-01-16; 22.31= 1.47 ft/sec with RW; 18.3 secs = 1.79 ft/sec = 1.8 ft/sec with cane=             17.32 = 1.89 ft/sec with RW on 01-06-17   Status Achieved     PT LONG TERM GOAL #4   Title Pt will ambulate 115' on flat, even surface with R AFO and SPC with rubber quad tip with supervision for incr. household accessibility.       Status Achieved     PT LONG TERM GOAL #5   Title Pt will transfer floor to stand with UE  support with min assist for fall recovery as needed.     Baseline mod assist on 01-06-17   Status Not Met     PT LONG TERM GOAL #6   Title Improve TUG score from 30.16 secs with cane to </= 21secs with cane with SBA to demo improved functional mobility.   TARGET DATE    Status On-going     PT LONG TERM GOAL #7   Title Pt will amb. with SPC with rubber quad tip 500' with SBA to demo increased endurance for incr. community accessibility with use of cane.   Status New     PT LONG TERM GOAL #8   Title Modified indepedent household amb. with SPC at least 25% of time during the day.   Baseline pt using RW at all times unless she has someone there to provide supervision   Status Not Met               Plan - 01/06/17 2104    Clinical Impression Statement Pt has met LTG's #1,3-4; goals #5 and 8 not met due to RLE weakness with mod assist needed for floor to stand transfer and due to imbalance and unsteadiness with gait with use of SPC - pt is fearful of falling and is only using SPC when her aide is there to provide supervision   Rehab Potential Good   Clinical Impairments Affecting Rehab Potential severity of impairments and length of time since CVA (4 yrs); dense Rt hemiplegia   PT Frequency 2x / week   PT Duration 4 weeks   PT Treatment/Interventions ADLs/Self Care Home Management;DME Instruction;Gait training;Stair training;Functional mobility training;Therapeutic activities;Therapeutic exercise;Balance training;Neuromuscular re-education;Patient/family education   PT Next Visit Plan check LTG's # 2, 6-7; D/C next session   PT Home Exercise Plan balance, strengthening , stretching for RLE   Consulted and Agree with Plan of Care Patient      Patient will benefit from skilled therapeutic intervention in order to improve the following deficits and impairments:  Abnormal gait, Decreased balance, Decreased coordination, Decreased range of motion, Decreased strength, Impaired tone,  Impaired UE functional use, Decreased activity tolerance  Visit Diagnosis: Other abnormalities of gait and mobility  Muscle weakness (generalized)     Problem List Patient Active Problem List   Diagnosis Date Noted  . Muscle spasticity 10/04/2015  . 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  Right hemiparesis ([place]) 05/10/2014  . Abnormality of gait 05/10/2014  . Closed fracture of right hip ([place]) 11/24/2012  . 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 Stroke ([place])   . Hypertension     Caylan Chenard, [place], PT 01/06/2017, 9:15 PM  [facility] [address] [place], [place], [address] Phone: [phone]   Fax:  [phone]  Name: Sophia Rodriguez MRN: [id number] Date of Birth: [birth year]

## 2017-01-09 ENCOUNTER — Ambulatory Visit: Payer: Medicare Other | Admitting: Physical Therapy

## 2017-01-09 ENCOUNTER — Encounter: Payer: Self-pay | Admitting: Physical Therapy

## 2017-01-09 DIAGNOSIS — R2689 Other abnormalities of gait and mobility: Secondary | ICD-10-CM | POA: Diagnosis not present

## 2017-01-09 DIAGNOSIS — M6281 Muscle weakness (generalized): Secondary | ICD-10-CM

## 2017-01-09 DIAGNOSIS — I69951 Hemiplegia and hemiparesis following unspecified cerebrovascular disease affecting right dominant side: Secondary | ICD-10-CM

## 2017-01-09 DIAGNOSIS — R2681 Unsteadiness on feet: Secondary | ICD-10-CM

## 2017-01-09 NOTE — Therapy (Signed)
[facility] [address] [place], [place], [address] Phone: [phone]   Fax:  [phone]  Physical Therapy Treatment  Patient Details  Name: Sophia Rodriguez MRN: [id number] Date of Birth: 11/02/1950 Referring Provider: Dr. Delice Lesch  Encounter Date: 01/09/2017      PT End of Session - 01/09/17 0935    Visit Number 24  G code due visit 24   Number of Visits 24   Date for PT Re-Evaluation 01/09/17   Authorization Type Medicare   Authorization Time Period 12-04-16 - 02-02-17   PT Start Time 0932   PT Stop Time 1013   PT Time Calculation (min) 41 min   Equipment Utilized During Treatment Gait belt   Activity Tolerance Patient tolerated treatment well   Behavior During Therapy [facility] for tasks assessed/performed      Past Medical History:  Diagnosis Date  . Arthritis   . Diabetes mellitus   . Hypertension   . Stroke [facility])     Past Surgical History:  Procedure Laterality Date  . HIP ARTHROPLASTY Right 11/24/2012   Procedure: RIGHT HIP HEMIARTHROPLASTY;  Surgeon: Augustin Schooling, MD;  Location: [facility];  Service: Orthopedics;  Laterality: Right;  hemiatroplasty, DePuy Triloc    There were no vitals filed for this visit.      Subjective Assessment - 01/09/17 0934    Subjective Reports she made it down to her sewing room and was able to complete her projects. Felt very encouraged by this.    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 2014); spasticity:  DM Type II   Patient Stated Goals be able to walk with the cane "and to use in the community if I can get that far"   Currently in Pain? No/denies   Pain Score 0-No pain            [facility] PT Assessment - 01/09/17 0937      Ambulation/Gait   Ambulation/Gait Yes   Ambulation/Gait Assistance 5: Supervision;6: Modified independent (Device/Increase time)   Ambulation/Gait  Assistance Details supervision for last ~15 feet due to right LE weakness, foot not fulling advancing. no balance issues noted through out gait.    Ambulation Distance (Feet) 420 Feet   Assistive device Straight cane  with rubber quad tip   Gait Pattern Step-through pattern;Decreased step length - right;Decreased step length - left;Decreased stance time - right;Decreased stride length;Poor foot clearance - right   Ambulation Surface Indoor;Level     Berg Balance Test   Sit to Stand Able to stand without using hands and stabilize independently   Standing Unsupported Able to stand safely 2 minutes   Sitting with Back Unsupported but Feet Supported on Floor or Stool Able to sit safely and securely 2 minutes   Stand to Sit Sits safely with minimal use of hands   Transfers Able to transfer safely, definite need of hands   Standing Unsupported with Eyes Closed Able to stand 10 seconds safely   Standing Ubsupported with Feet Together Able to place feet together independently and stand 1 minute safely   From Standing, Reach Forward with Outstretched Arm Can reach confidently >25 cm (10")   From Standing Position, Pick up Object from Floor Able to pick up shoe safely and easily   From Standing Position, Turn to Look Behind Over each Shoulder Looks behind from both sides and weight shifts well  Turn 360 Degrees Able to turn 360 degrees safely but slowly  > 6 seconds both ways   Standing Unsupported, Alternately Place Feet on Step/Stool Able to complete >2 steps/needs minimal assist   Standing Unsupported, One Foot in Front Able to take small step independently and hold 30 seconds   Standing on One Leg Able to lift leg independently and hold 5-10 seconds   Total Score 47     Timed Up and Go Test   TUG Normal TUG   Normal TUG (seconds) 25.07  with cane with rubber quad tip            PT Long Term Goals - 01/09/17 0936      PT LONG TERM GOAL #1   Title Independent in updated HEP as  appropriate for LE strengthening and balance.   Baseline met 01-06-17   Status Achieved     PT LONG TERM GOAL #2   Title Pt will improve BERG balance score to >/= 45/56 in order to indicate decreased fall risk.     Baseline 01/09/17: 47/56 scored today   Status Achieved     PT LONG TERM GOAL #3   Title Pt will improve gait speed from 1.23 ft/sec with RW to >/= 1.8 ft/sec in order to indicate decreased fall risk and improved efficiency of gait.    Baseline 17.32 = 1.89 ft/sec with RW on 01-06-17   Status Achieved     PT LONG TERM GOAL #4   Title Pt will ambulate 115' on flat, even surface with R AFO and SPC with rubber quad tip with supervision for incr. household accessibility.       Baseline met on 01/06/17   Status Achieved     PT LONG TERM GOAL #5   Title Pt will transfer floor to stand with UE support with min assist for fall recovery as needed.     Baseline mod assist on 01-06-17   Status Not Met     PT LONG TERM GOAL #6   Title Improve TUG score from 30.16 secs with cane to </= 21secs with cane with SBA to demo improved functional mobility.   TARGET DATE    Baseline 01/09/17: improved with less time, just not to goal (25.07 sec's today, 38.19 sec's time before)   Status Partially Met     PT LONG TERM GOAL #7   Title Pt will amb. with SPC with rubber quad tip 500' with SBA to demo increased endurance for incr. community accessibility with use of cane.   Baseline 01/09/17: pt needed rest break after 420 feet with cane, improved just not to goal   Status Partially Met     PT LONG TERM GOAL #8   Title Modified indepedent household amb. with SPC at least 25% of time during the day.   Baseline pt using RW at all times unless she has someone there to provide supervision   Status Not Met            Plan - 01/09/17 0936    Clinical Impression Statement Pt met 1 of 3 remaining LTGs and partially met the other 2. No questions remained with pt agreeable to discharge today.                             Rehab Potential Good   Clinical Impairments Affecting Rehab Potential severity of impairments and length of time since CVA (4 yrs); dense Rt hemiplegia  PT Frequency 2x / week   PT Duration 4 weeks   PT Treatment/Interventions ADLs/Self Care Home Management;DME Instruction;Gait training;Stair training;Functional mobility training;Therapeutic activities;Therapeutic exercise;Balance training;Neuromuscular re-education;Patient/family education   PT Next Visit Plan discharge per PT plan of care.   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 420' with SPC - pt now has leather toe cap on Rt shoe   Functional Limitation Mobility: Walking and moving around      Problem List Patient Active Problem List   Diagnosis Date Noted  . Muscle spasticity 10/04/2015  . 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 Right hemiparesis ([place]) 05/10/2014  . Abnormality of gait 05/10/2014  . Closed fracture of right hip ([place]) 11/24/2012  . 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 Stroke ([place])   . Hypertension     Willow Ora, PTA, [facility] [address], [place], [place] [address] [phone] 02-07-17, 12:00 PM   Name: Kadija Cruzen MRN: [id number] Date of Birth: 1951-01-30

## 2017-01-22 ENCOUNTER — Encounter: Payer: Self-pay | Admitting: Physical Medicine & Rehabilitation

## 2017-01-22 ENCOUNTER — Encounter: Payer: Medicare Other | Attending: Physical Medicine & Rehabilitation | Admitting: Physical Medicine & Rehabilitation

## 2017-01-22 VITALS — BP 113/78 | HR 72

## 2017-01-22 DIAGNOSIS — I69898 Other sequelae of other cerebrovascular disease: Secondary | ICD-10-CM | POA: Insufficient documentation

## 2017-01-22 DIAGNOSIS — I10 Essential (primary) hypertension: Secondary | ICD-10-CM | POA: Insufficient documentation

## 2017-01-22 DIAGNOSIS — Z8673 Personal history of transient ischemic attack (TIA), and cerebral infarction without residual deficits: Secondary | ICD-10-CM | POA: Diagnosis not present

## 2017-01-22 DIAGNOSIS — G8310 Monoplegia of lower limb affecting unspecified side: Secondary | ICD-10-CM

## 2017-01-22 DIAGNOSIS — E119 Type 2 diabetes mellitus without complications: Secondary | ICD-10-CM | POA: Diagnosis not present

## 2017-01-22 DIAGNOSIS — R269 Unspecified abnormalities of gait and mobility: Secondary | ICD-10-CM | POA: Insufficient documentation

## 2017-01-22 NOTE — Progress Notes (Signed)
Dysport: Procedure Note Patient Name: Vanice Sarahnthea Requena DOB: Aug 11, 1950 MRN: [id number]  01/22/17   Procedure: Botulinum toxin administration Guidance: EMG Diagnosis: Spastic monoplegia Attending: Maryla MorrowAnkit Ferris Tally, MD    Informed consent: Risks, benefits & options of the procedure are explained to the patient (and/or family). The patient elects to proceed with procedure. Risks include but are not limited to weakness, respiratory distress, dry mouth, ptosis, antibody formation, worsening of some areas of function. Benefits include decreased abnormal muscle tone, improved hygiene and positioning, decreased skin breakdown and, in some cases, decreased pain. Options include conservative management with oral antispasticity agents, phenol chemodenervation of nerve or at motor nerve branches. More invasive options include intrathecal balcofen adminstration for appropriate candidates. Surgical options may include tendon lengthening or transposition or, rarely, dorsal rhizotomy.   History/Physical Examination: 66 y.o. female with pmh of strokes x3 (most recently ~2014), DM, HTN presents for follow up of spasticity.  mAS: RLE ankle plantarflexors 1+/4, more pronounced with ambulation. Previous Treatments: Therapy/Range of motion/Bracing/Medications Indication for guidance: Target active muscules  Procedure: Botulinum toxin was mixed with preservative free saline with a dilution of 0.5cc to 100 units. Targeted limb and muscles were identified. The skin was prepped with alcohol swabs and placement of needle tip in targeted muscle was confirmed using appropriate guidance. Prior to injection, positioning of needle tip outside of blood vessel was determined by pulling back on syringe plunger.  MUSCLE UNITS Right medial gastoc 250U Right lateral gastroc 250U  Total units used: 500 Total units wasted: 0 Complications: None Plan: Follow up in 6 weeks  Blase Beckner Anil Monserrath Junio  9:10 AM

## 2017-03-03 ENCOUNTER — Encounter: Payer: Self-pay | Admitting: Physical Therapy

## 2017-03-03 NOTE — Therapy (Unsigned)
[facility] [address] [place], [place], [address] Phone: [phone]   Fax:  [phone]  Patient Details  Name: Sophia Rodriguez MRN: [id number] Date of Birth: 08/23/1950 Referring Provider: Dr. Delice Lesch  Encounter Date: 03/03/2017  PHYSICAL THERAPY DISCHARGE SUMMARY  Visits from Start of Care: 24  Current functional level related to goals / functional outcomes: As of 10/19 last visit: Berg 47/56; gait velocity 1.89 ft/sec; ambulating with AFO and SPC with quad rubber tip with supervision; met 1 of 3 LTGs and partially met the remaining 2 LTGs.   Remaining deficits: Poor Rt foot clearance (with AFO still required leather toe cap on shoe); supervision for gait   Education / Equipment: See HEP  Plan: Patient agrees to discharge.  Patient goals were partially met. Patient is being discharged due to being pleased with the current functional level.  ?????      Rexanne Mano, PT 03/03/2017, 11:20 AM  [facility] [address] [place], [place], [address] Phone: [phone]   Fax:  [phone]

## 2017-03-05 ENCOUNTER — Encounter: Payer: Medicare Other | Admitting: Physical Medicine & Rehabilitation

## 2017-03-13 ENCOUNTER — Encounter: Payer: Self-pay | Admitting: Physical Medicine & Rehabilitation

## 2017-03-13 ENCOUNTER — Encounter: Payer: Medicare Other | Attending: Physical Medicine & Rehabilitation | Admitting: Physical Medicine & Rehabilitation

## 2017-03-13 VITALS — BP 123/83 | HR 74 | Resp 14

## 2017-03-13 DIAGNOSIS — G8310 Monoplegia of lower limb affecting unspecified side: Secondary | ICD-10-CM

## 2017-03-13 DIAGNOSIS — I10 Essential (primary) hypertension: Secondary | ICD-10-CM | POA: Diagnosis not present

## 2017-03-13 DIAGNOSIS — E119 Type 2 diabetes mellitus without complications: Secondary | ICD-10-CM | POA: Insufficient documentation

## 2017-03-13 DIAGNOSIS — R269 Unspecified abnormalities of gait and mobility: Secondary | ICD-10-CM | POA: Insufficient documentation

## 2017-03-13 DIAGNOSIS — I69898 Other sequelae of other cerebrovascular disease: Secondary | ICD-10-CM | POA: Insufficient documentation

## 2017-03-13 DIAGNOSIS — Z8673 Personal history of transient ischemic attack (TIA), and cerebral infarction without residual deficits: Secondary | ICD-10-CM | POA: Insufficient documentation

## 2017-03-13 NOTE — Progress Notes (Signed)
Subjective:    Patient ID: Sophia Rodriguez, female    DOB: [birth year], 66 y.o.   MRN: [id number]  HPI  66 y/o female with pmh of strokes x3 (most recently ~2014), DM, HTN presents for follow up of spasticity.   Initially stated: The problem is mainly in her right leg.  It causes her difficulty with ambulation.  She uses a rolling walker currently and has not had any falls.  The problems started ~ 5 years ago getting worse over time. The biggest problems seems to be dragging of her foot.  It gets worse at the end of the day.  She has some associated weakness, but denies numbness/tingling.  Previously she was prescribed Baclofen 5, which did not work and 10, which caused her too much drowsiness.  She had Botox, but this was not effective, tizanidine made her too drowsy.   She was last seen in clinic on 01/22/17.  At that time, pt had dysport injection.  She notes improvement with increased dose of injection.   Pain Inventory Average Pain 3 Pain Right Now 0 My pain is tingling and other  In the last 24 hours, has pain interfered with the following? General activity 7 Relation with others 6 Enjoyment of life 6 What TIME of day is your pain at its worst? evening Sleep (in general) Fair  Pain is worse with: some activites Pain improves with: rest, pacing activities and medication Relief from Meds: 5  Mobility walk with assistance use a cane use a walker how many minutes can you walk? 10 ability to climb steps?  no do you drive?  no Do you have any goals in this area?  yes  Function disabled: date disabled . Do you have any goals in this area?  no  Neuro/Psych confusion depression anxiety  Prior Studies Any changes since last visit?  no  Physicians involved in your care Any changes since last visit?  no   Family History  Problem Relation Age of Onset  . Stroke Father   . Stroke Brother    Social History   Socioeconomic History  . Marital status: Single    Spouse  name: None  . Number of children: 2  . Years of education: Grad school  . Highest education level: None  Social Needs  . Financial resource strain: None  . Food insecurity - worry: None  . Food insecurity - inability: None  . Transportation needs - medical: None  . Transportation needs - non-medical: None  Occupational History  . None  Tobacco Use  . Smoking status: Former Smoker    Last attempt to quit: 05/28/2007    Years since quitting: 9.8  . Smokeless tobacco: Never Used  Substance and Sexual Activity  . Alcohol use: No  . Drug use: No  . Sexual activity: None  Other Topics Concern  . None  Social History Narrative   Lives at home by herself.    Daughter come to help at home and she has a cleaning lady to help twice a week.    Caffeine: Every morning 1-1.5 cups coffee/day, and 12oz tea/day    Right-handed   Past Surgical History:  Procedure Laterality Date  . HIP ARTHROPLASTY Right 11/24/2012   Procedure: RIGHT HIP HEMIARTHROPLASTY;  Surgeon: Verlee RossettiSteven R Norris, MD;  Location: [facility];  Service: Orthopedics;  Laterality: Right;  hemiatroplasty, DePuy Triloc   Past Medical History:  Diagnosis Date  . Arthritis   . Diabetes mellitus   . Hypertension   .  Stroke (HCC)    BP 123/83 (BP Location: Right Arm, Patient Position: Sitting, Cuff Size: Normal)   Pulse 74   Resp 14   SpO2 95%   Opioid Risk Score:   Fall Risk Score:  `1  Depression screen PHQ 2/9  [facility] 12/03/2016 06/12/2016 01/11/2016 11/01/2015 07/13/2015 06/15/2015  Decreased Interest 1 1 0 0 1 1  Down, Depressed, Hopeless 1 1 0 0 1 1  PHQ - 2 Score 2 2 0 0 2 2  Altered sleeping - - - - - 3  Tired, decreased energy - - - - - 2  Change in appetite - - - - - 0  Feeling bad or failure about yourself  - - - - - 1  Trouble concentrating - - - - - 3  Moving slowly or fidgety/restless - - - - - 2  Suicidal thoughts - - - - - 0  PHQ-9 Score - - - - - 13  Difficult doing work/chores - - - - -  Somewhat difficult   Review of Systems  Musculoskeletal: Positive for gait problem.  Neurological: Positive for weakness.  Psychiatric/Behavioral: Positive for dysphoric mood. Memory loss. All other systems reviewed and are negative.     Objective:   Physical Exam HENT: Normocephalic, Atraumatic Eyes: EOMI. No discharge.  Cardio: RRR. No JVD. Pulm: B/l clear to auscultation.  Effort normal. Abd: Soft, BS+ MSK:  Gait improved, did not bring AFO today.   No TTP.    No edema. Neuro:   Sensation intact to light touch in all LE dermatomes  Strength  4+/5 in RLE myotomes (stable)      MAS: Right plantar flexors: minimal tone Skin: Warm and Dry. Intact.     Assessment & Plan:  66 y/o female with pmh of strokes x3 (most recently ~2014), DM, HTN presents for follow up of spasticity.   1. RLE spasticity s/p stroke  Side effects with Tizanidine, balcofen  Encouraged muscle stretches.    Completed PT, cont HEP  Encouraged presence of daughter.  Cont AFO  Pt would like to cont dose, plan for    250U Dysport to right lateral gastroc   250U Dysport to left lateral gastroc  Completed PT, plan for referral after next visit  2. Abnormality of gait  Toe walking with RLE, improving  Cont cane for safety

## 2017-04-24 ENCOUNTER — Encounter: Payer: Medicare Other | Attending: Physical Medicine & Rehabilitation | Admitting: Physical Medicine & Rehabilitation

## 2017-04-24 ENCOUNTER — Encounter: Payer: Self-pay | Admitting: Physical Medicine & Rehabilitation

## 2017-04-24 VITALS — BP 114/77 | HR 77

## 2017-04-24 DIAGNOSIS — I69898 Other sequelae of other cerebrovascular disease: Secondary | ICD-10-CM | POA: Insufficient documentation

## 2017-04-24 DIAGNOSIS — R269 Unspecified abnormalities of gait and mobility: Secondary | ICD-10-CM | POA: Insufficient documentation

## 2017-04-24 DIAGNOSIS — I10 Essential (primary) hypertension: Secondary | ICD-10-CM | POA: Insufficient documentation

## 2017-04-24 DIAGNOSIS — E119 Type 2 diabetes mellitus without complications: Secondary | ICD-10-CM | POA: Insufficient documentation

## 2017-04-24 DIAGNOSIS — Z8673 Personal history of transient ischemic attack (TIA), and cerebral infarction without residual deficits: Secondary | ICD-10-CM | POA: Insufficient documentation

## 2017-04-24 DIAGNOSIS — G8310 Monoplegia of lower limb affecting unspecified side: Secondary | ICD-10-CM | POA: Diagnosis not present

## 2017-04-24 NOTE — Progress Notes (Signed)
Dysport: Procedure Note Patient Name: Sophia Rodriguez DOB: 04-Sep-1950 MRN: [id number]  04/24/17   Procedure: Botulinum toxin administration Guidance: EMG Diagnosis: Spastic monoplegia Attending: Maryla MorrowAnkit Deandra Gadson, MD    Informed consent: Risks, benefits & options of the procedure are explained to the patient (and/or family). The patient elects to proceed with procedure. Risks include but are not limited to weakness, respiratory distress, dry mouth, ptosis, antibody formation, worsening of some areas of function. Benefits include decreased abnormal muscle tone, improved hygiene and positioning, decreased skin breakdown and, in some cases, decreased pain. Options include conservative management with oral antispasticity agents, phenol chemodenervation of nerve or at motor nerve branches. More invasive options include intrathecal balcofen adminstration for appropriate candidates. Surgical options may include tendon lengthening or transposition or, rarely, dorsal rhizotomy.   History/Physical Examination: 67 y.o. female with pmh of strokes x3 (most recently ~2014), DM, HTN presents for follow up of spasticity.  mAS: RLE ankle plantarflexors 1+/4, more pronounced with ambulation. Previous Treatments: Therapy/Range of motion/Bracing/Medications Indication for guidance: Target active muscules  Procedure: Botulinum toxin was mixed with preservative free saline with a dilution of 0.5cc to 100 units. Targeted limb and muscles were identified. The skin was prepped with alcohol swabs and placement of needle tip in targeted muscle was confirmed using appropriate guidance. Prior to injection, positioning of needle tip outside of blood vessel was determined by pulling back on syringe plunger.  MUSCLE UNITS Right medial gastoc 250U Right lateral gastroc 250U  Total units used: 500 Total units wasted: 0 Complications: None  Plan: Follow up in 6 weeks for evaluation, will likely order therapies at that  time  Sophia Rodriguez 9:18 AM

## 2017-04-30 ENCOUNTER — Encounter: Payer: Self-pay | Admitting: Nurse Practitioner

## 2017-06-04 ENCOUNTER — Ambulatory Visit: Payer: Medicare Other | Admitting: Physical Medicine & Rehabilitation

## 2017-06-12 ENCOUNTER — Encounter: Payer: Medicare Other | Attending: Physical Medicine & Rehabilitation | Admitting: Physical Medicine & Rehabilitation

## 2017-06-12 ENCOUNTER — Encounter: Payer: Self-pay | Admitting: Physical Medicine & Rehabilitation

## 2017-06-12 VITALS — BP 115/80 | HR 92

## 2017-06-12 DIAGNOSIS — I69898 Other sequelae of other cerebrovascular disease: Secondary | ICD-10-CM | POA: Diagnosis present

## 2017-06-12 DIAGNOSIS — I6930 Unspecified sequelae of cerebral infarction: Secondary | ICD-10-CM

## 2017-06-12 DIAGNOSIS — E119 Type 2 diabetes mellitus without complications: Secondary | ICD-10-CM | POA: Insufficient documentation

## 2017-06-12 DIAGNOSIS — Z8673 Personal history of transient ischemic attack (TIA), and cerebral infarction without residual deficits: Secondary | ICD-10-CM | POA: Diagnosis not present

## 2017-06-12 DIAGNOSIS — R269 Unspecified abnormalities of gait and mobility: Secondary | ICD-10-CM | POA: Insufficient documentation

## 2017-06-12 DIAGNOSIS — I10 Essential (primary) hypertension: Secondary | ICD-10-CM | POA: Diagnosis not present

## 2017-06-12 DIAGNOSIS — G8310 Monoplegia of lower limb affecting unspecified side: Secondary | ICD-10-CM | POA: Diagnosis not present

## 2017-06-12 NOTE — Progress Notes (Signed)
Subjective:    Patient ID: Sophia Rodriguez, female    DOB: 23-Aug-1950, 67 y.o.   MRN: [id number]  HPI  67 y/o female with pmh of strokes x3 (most recently ~2014), DM, HTN presents for follow up of spasticity.   Initially stated: The problem is mainly in her right leg.  It causes her difficulty with ambulation.  She uses a rolling walker currently and has not had any falls.  The problems started ~ 5 years ago getting worse over time. The biggest problems seems to be dragging of her foot.  It gets worse at the end of the day.  She has some associated weakness, but denies numbness/tingling.  Previously she was prescribed Baclofen 5, which did not work and 10, which caused her too much drowsiness.  She had Botox, but this was not effective, tizanidine made her too drowsy.   She was last seen in clinic on 04/24/17.  At that time, pt had dysport injection.  She states she feels she still needs to go up on the dose. She notes a loss of balance when trying to get milk at the grocery store.  No AFO today.   Pain Inventory Average Pain 3 Pain Right Now 0 My pain is tingling and other  In the last 24 hours, has pain interfered with the following? General activity 4 Relation with others 4 Enjoyment of life 4 What TIME of day is your pain at its worst? evening Sleep (in general) Fair  Pain is worse with: some activites Pain improves with: rest, pacing activities and medication Relief from Meds: 5  Mobility walk with assistance use a cane use a walker how many minutes can you walk? 10 ability to climb steps?  no do you drive?  no Do you have any goals in this area?  yes  Function disabled: date disabled . Do you have any goals in this area?  no  Neuro/Psych confusion depression anxiety  Prior Studies Any changes since last visit?  no  Physicians involved in your care Any changes since last visit?  no   Family History  Problem Relation Age of Onset  . Stroke Father   . Stroke  Brother    Social History   Socioeconomic History  . Marital status: Single    Spouse name: Not on file  . Number of children: 2  . Years of education: Grad school  . Highest education level: Not on file  Occupational History  . Not on file  Social Needs  . Financial resource strain: Not on file  . Food insecurity:    Worry: Not on file    Inability: Not on file  . Transportation needs:    Medical: Not on file    Non-medical: Not on file  Tobacco Use  . Smoking status: Former Smoker    Last attempt to quit: 05/28/2007    Years since quitting: 10.0  . Smokeless tobacco: Never Used  Substance and Sexual Activity  . Alcohol use: No  . Drug use: No  . Sexual activity: Not on file  Lifestyle  . Physical activity:    Days per week: Not on file    Minutes per session: Not on file  . Stress: Not on file  Relationships  . Social connections:    Talks on phone: Not on file    Gets together: Not on file    Attends religious service: Not on file    Active member of club or organization: Not on  file    Attends meetings of clubs or organizations: Not on file    Relationship status: Not on file  Other Topics Concern  . Not on file  Social History Narrative   Lives at home by herself.    Daughter come to help at home and she has a cleaning lady to help twice a week.    Caffeine: Every morning 1-1.5 cups coffee/day, and 12oz tea/day    Right-handed   Past Surgical History:  Procedure Laterality Date  . HIP ARTHROPLASTY Right 11/24/2012   Procedure: RIGHT HIP HEMIARTHROPLASTY;  Surgeon: Verlee RossettiSteven R Norris, MD;  Location: [facility];  Service: Orthopedics;  Laterality: Right;  hemiatroplasty, DePuy Triloc   Past Medical History:  Diagnosis Date  . Arthritis   . Diabetes mellitus   . Hypertension   . Stroke (HCC)    BP 115/80   Pulse 92   SpO2 95%   Opioid Risk Score:   Fall Risk Score:  `1  Depression screen PHQ 2/9  [facility] 12/03/2016 06/12/2016 01/11/2016  11/01/2015 07/13/2015 06/15/2015  Decreased Interest 1 1 0 0 1 1  Down, Depressed, Hopeless 1 1 0 0 1 1  PHQ - 2 Score 2 2 0 0 2 2  Altered sleeping - - - - - 3  Tired, decreased energy - - - - - 2  Change in appetite - - - - - 0  Feeling bad or failure about yourself  - - - - - 1  Trouble concentrating - - - - - 3  Moving slowly or fidgety/restless - - - - - 2  Suicidal thoughts - - - - - 0  PHQ-9 Score - - - - - 13  Difficult doing work/chores - - - - - Somewhat difficult   Review of Systems  Musculoskeletal: +gait problem.  Neurological: Positive for weakness.  Psychiatric/Behavioral: Positive for dysphoric mood. Memory loss. All other systems reviewed and are negative.     Objective:   Physical Exam HENT: Normocephalic, Atraumatic Eyes: EOMI. No discharge.  Cardio: RRR. No JVD. Pulm: B/l clear to auscultation.  Effort normal. Abd: Soft, BS+ MSK:  Gait improved, did not bring AFO today.   No TTP.    No edema. Neuro:   Sensation intact to light touch in all LE dermatomes  Strength  4+/5 in RLE myotomes (unchanged)  MAS: Right plantar flexors: trace tone Skin: Warm and Dry. Intact.     Assessment & Plan:  67 y/o female with pmh of strokes x3 (most recently ~2014), DM, HTN presents for follow up of spasticity.   1. RLE spasticity s/p stroke  Side effects with Tizanidine, balcofen  Encouraged muscle stretches.    Completed PT, cont HEP  Encouraged presence of daughter.  Cont AFO  Cont dose, plan for    250U Dysport to right lateral gastroc   250U Dysport to right medial gastroc  Will refer to PT for gait training due to recent fall.   2. Abnormality of gait  Cont cane for safety   Referral to PT

## 2017-06-17 ENCOUNTER — Ambulatory Visit: Payer: Medicare Other | Admitting: Nurse Practitioner

## 2017-07-22 ENCOUNTER — Encounter: Payer: Medicare Other | Attending: Physical Medicine & Rehabilitation | Admitting: Physical Medicine & Rehabilitation

## 2017-07-22 ENCOUNTER — Encounter: Payer: Self-pay | Admitting: Physical Medicine & Rehabilitation

## 2017-07-22 VITALS — BP 110/68 | HR 87 | Ht 65.0 in | Wt 173.0 lb

## 2017-07-22 DIAGNOSIS — R269 Unspecified abnormalities of gait and mobility: Secondary | ICD-10-CM | POA: Diagnosis not present

## 2017-07-22 DIAGNOSIS — E119 Type 2 diabetes mellitus without complications: Secondary | ICD-10-CM | POA: Diagnosis not present

## 2017-07-22 DIAGNOSIS — G8314 Monoplegia of lower limb affecting left nondominant side: Secondary | ICD-10-CM | POA: Diagnosis not present

## 2017-07-22 DIAGNOSIS — G8310 Monoplegia of lower limb affecting unspecified side: Secondary | ICD-10-CM

## 2017-07-22 DIAGNOSIS — G8311 Monoplegia of lower limb affecting right dominant side: Secondary | ICD-10-CM

## 2017-07-22 DIAGNOSIS — Z8673 Personal history of transient ischemic attack (TIA), and cerebral infarction without residual deficits: Secondary | ICD-10-CM | POA: Insufficient documentation

## 2017-07-22 DIAGNOSIS — I10 Essential (primary) hypertension: Secondary | ICD-10-CM | POA: Diagnosis not present

## 2017-07-22 DIAGNOSIS — I69898 Other sequelae of other cerebrovascular disease: Secondary | ICD-10-CM | POA: Insufficient documentation

## 2017-07-22 NOTE — Progress Notes (Signed)
Dysport: Procedure Note Patient Name: Sophia Rodriguez DOB: 02/02/1951 MRN: [id number]  04/24/17   Procedure: Botulinum toxin administration Guidance: EMG Diagnosis: Spastic monoplegia Attending: Maryla Morrow, MD    Informed consent: Risks, benefits & options of the procedure are explained to the patient (and/or family). The patient elects to proceed with procedure. Risks include but are not limited to weakness, respiratory distress, dry mouth, ptosis, antibody formation, worsening of some areas of function. Benefits include decreased abnormal muscle tone, improved hygiene and positioning, decreased skin breakdown and, in some cases, decreased pain. Options include conservative management with oral antispasticity agents, phenol chemodenervation of nerve or at motor nerve branches. More invasive options include intrathecal balcofen adminstration for appropriate candidates. Surgical options may include tendon lengthening or transposition or, rarely, dorsal rhizotomy.   History/Physical Examination: 67 y.o. female with pmh of strokes x3 (most recently ~2014), DM, HTN presents for follow up of spasticity.  mAS: RLE ankle plantarflexors 1+/4, more pronounced with ambulation. Previous Treatments: Therapy/Range of motion/Bracing/Medications Indication for guidance: Target active muscules  Procedure: Botulinum toxin was mixed with preservative free saline with a dilution of 0.5cc to 100 units. Targeted limb and muscles were identified. The skin was prepped with alcohol swabs and placement of needle tip in targeted muscle was confirmed using appropriate guidance. Prior to injection, positioning of needle tip outside of blood vessel was determined by pulling back on syringe plunger.  MUSCLE UNITS Right medial gastoc 250U Right lateral gastroc 250U  Total units used: 500 Total units wasted: 0 Complications: None  Plan: Follow up in 6 weeks for evaluation Sophia Rodriguez Sophia Rodriguez 8:49 AM

## 2017-07-29 NOTE — Progress Notes (Signed)
[facility]  PATIENT: Sophia Rodriguez DOB: 1951/02/19   REASON FOR VISIT: Follow-up for left frontal stroke and   right thalmic infarct HISTORY FROM: Patient alone at visit    HISTORY OF PRESENT ILLNESS: Interval history 06/10/2016: Patient is here for follow up. 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 Sophia Rodriguez has right-sided weakness from left frontal stroke and left-sided weakness from right thalamic and right PLIC infarcts. 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 Sophia Rodriguez continues to see Sophia Rodriguez in Rehab. Sophia Rodriguez had side effects with tizanidine and baclofen. Sophia Rodriguez encourage stretching exercises and felt at this time Botox injections should be held off on.Sophia Rodriguez feels stable. No falls. Sophia Rodriguez is walking with a walker. Sophia Rodriguez uses a wheelchair when Sophia Rodriguez goes out for long distance. Sophia Rodriguez is seeing her primary care and endorses compliance on all her medications and daily ASA 325. Sophia Rodriguez is on gabapentin 3x a day and sometimes still wakes with pain behind the ear about 3x a week if Sophia Rodriguez sleeps on that side. Will increase gabapentin only at night.  Interval history: Sophia Rodriguez does not feel that Sophia Rodriguez got any better with PT. Sophia Rodriguez is still having difficulty walking. Sophia Rodriguez is dragging the right leg. Sophia Rodriguez has stiffness and cramps in the right leg. No falls recently. Sophia Rodriguez is using her walker all the time. Sophia Rodriguez takes neurontin in the morning and night. Her depression comes and goes but Sophia Rodriguez is feeling better and Sophia Rodriguez increased her celexa. Sophia Rodriguez has a right foot orthosis but doesn't wear it. Will refer to Sophia Rodriguez.  UPDATE 5/10/2019CM Sophia Rodriguez, 67 year old female returns for follow-up with history of multiple strokes in the past.  Sophia Rodriguez remains on aspirin for secondary stroke prevention with minimal bruising and no bleeding.  Sophia Rodriguez is on  Lipitor without myalgias.  Blood pressure in the office today 119/78.  Sophia Rodriguez remains on Neurontin only at night for ear pain if Sophia Rodriguez sleeps on that side.  Sophia Rodriguez continues to get Botox most recently May 1 which Sophia Rodriguez claims helps her ambulation.  Sophia Rodriguez continues to use a walker, Sophia Rodriguez is seated in a wheelchair today.  Sophia Rodriguez has had one fall since last seen.  Sophia Rodriguez did not have any injury Sophia Rodriguez reports her brother died of a stroke in 05/02/2022.  Sophia Rodriguez returns for reevaluation  REVIEW OF SYSTEMS: Full 14 system review of systems performed and notable only for those listed, all others are neg:  Constitutional: neg  Cardiovascular: neg Ear/Nose/Throat: neg  Skin: neg Eyes: neg Respiratory: neg Gastroitestinal: neg  Hematology/Lymphatic: neg  Endocrine: neg Musculoskeletal: Walking difficulty Allergy/Immunology: neg Neurological: neg Psychiatric: neg Sleep : neg   ALLERGIES: No Known Allergies  HOME MEDICATIONS: Outpatient Medications Prior to Visit  Medication Sig Dispense Refill  . alendronate (FOSAMAX) 70 MG tablet Take 70 mg by mouth once a week. Take with a full glass of water on an empty stomach.    Marland Kitchen aspirin 325 MG tablet Take 1 tablet (325 mg total) by mouth daily.    Marland Kitchen atorvastatin (LIPITOR) 40 MG tablet Take 1 tablet (40 mg total) by mouth daily at 6 PM. 30 tablet 1  . cetirizine (ZYRTEC) 10 MG tablet Take 10 mg by mouth daily.    . furosemide (LASIX) 20 MG tablet TK 1 T PO  QD PRF LEG SWELLING  0  . gabapentin (NEURONTIN) 300 MG capsule Take  300 mg by mouth 3 (three) times daily.    Marland Kitchen lisinopril (PRINIVIL,ZESTRIL) 10 MG tablet Take 10 mg by mouth daily.    . Multiple Vitamin (MULTIVITAMIN WITH MINERALS) TABS Take 1 tablet by mouth daily.    Marland Kitchen olopatadine (PATANOL) 0.1 % ophthalmic solution INSTILL 1 GTT IN OU BID  12  . Vitamin D, Ergocalciferol, (DRISDOL) 50000 UNITS CAPS capsule Take 50,000 Units by mouth every 7 (seven) days.     Marland Kitchen zolpidem (AMBIEN) 10 MG tablet Take 10 mg by mouth at bedtime  as needed.  5   No facility-administered medications prior to visit.     PAST MEDICAL HISTORY: Past Medical History:  Diagnosis Date  . Arthritis   . Diabetes mellitus   . Hypertension   . Stroke [facility])     PAST SURGICAL HISTORY: Past Surgical History:  Procedure Laterality Date  . HIP ARTHROPLASTY Right 11/24/2012   Procedure: RIGHT HIP HEMIARTHROPLASTY;  Surgeon: Verlee Rossetti, MD;  Location: [facility];  Service: Orthopedics;  Laterality: Right;  hemiatroplasty, DePuy Triloc    FAMILY HISTORY: Family History  Problem Relation Age of Onset  . Stroke Father   . Stroke Brother     SOCIAL HISTORY: Social History   Socioeconomic History  . Marital status: Single    Spouse name: Not on file  . Number of children: 2  . Years of education: Grad school  . Highest education level: Not on file  Occupational History  . Not on file  Social Needs  . Financial resource strain: Not on file  . Food insecurity:    Worry: Not on file    Inability: Not on file  . Transportation needs:    Medical: Not on file    Non-medical: Not on file  Tobacco Use  . Smoking status: Former Smoker    Last attempt to quit: 05/28/2007    Years since quitting: 10.1  . Smokeless tobacco: Never Used  Substance and Sexual Activity  . Alcohol use: No  . Drug use: No  . Sexual activity: Not on file  Lifestyle  . Physical activity:    Days per week: Not on file    Minutes per session: Not on file  . Stress: Not on file  Relationships  . Social connections:    Talks on phone: Not on file    Gets together: Not on file    Attends religious service: Not on file    Active member of club or organization: Not on file    Attends meetings of clubs or organizations: Not on file    Relationship status: Not on file  . Intimate partner violence:    Fear of current or ex partner: Not on file    Emotionally abused: Not on file    Physically abused: Not on file    Forced sexual activity: Not on file  Other  Topics Concern  . Not on file  Social History Narrative   Lives at home by herself.    Daughter come to help at home and Sophia Rodriguez has a cleaning lady to help twice a week.    Caffeine: Every morning 1-1.5 cups coffee/day, and 12oz tea/day    Right-handed     PHYSICAL EXAM  Vitals:   07/31/17 0908  BP: 119/78  Pulse: 92  Weight: 172 lb (78 kg)  Height:  (1.651 m)   Body mass index is 28.62 kg/m.  Generalized: Well developed, in no acute distress   Neurological examination  Mentation: Alert oriented to time, place,not the date.    Follows all commands speech and language fluent.   Cranial nerve II-XII: Pupils were equal round reactive to light extraocular movements were full, visual field were full on confrontational test.  Mild left facial weakness . hearing was intact to finger rubbing bilaterally. Uvula tongue midline. head turning and shoulder shrug were normal and symmetric.Tongue protrusion into cheek strength was normal. Motor: normal bulk and tone, full strength in the BUE, BLE, 4/5 increased tone on the right,  Sensory: normal and symmetric to light touch, Coordination: finger-nose-finger, bradykinesia, cannot do heel-to-shin Reflexes: Brachioradialis 2/2, biceps 2/2, triceps 2/2, patellarabsent Achilles 1/1, plantar responses were flexor bilaterally. Gait and Station: Rising up from seated position with push off, stooped posture mild foot drop on the right, slow steady gait  DIAGNOSTIC DATA (LABS, IMAGING, TESTING) - I reviewed patient records, labs, notes, testing and imaging myself where available.  Lab Results  Component Value Date   WBC 7.1 12/01/2014   HGB 17.7 (H) 12/01/2014   HCT 52.0 (H) 12/01/2014   MCV 81.1 12/01/2014   PLT 250 12/01/2014      Component Value Date/Time   NA 141 12/01/2014 1811   K 3.9 12/01/2014 1811   CL 105 12/01/2014 1811   CO2 22 12/01/2014 1802   GLUCOSE 130 (H) 12/01/2014 1811   BUN 15 12/01/2014 1811   CREATININE 1.10  (H) 12/01/2014 1811   CALCIUM 9.9 12/01/2014 1802   PROT 8.1 12/01/2014 1802   PROT 7.4 11/22/2014 0932   ALBUMIN 3.9 12/01/2014 1802   AST 30 12/01/2014 1802   ALT 21 12/01/2014 1802   ALKPHOS 100 12/01/2014 1802   BILITOT 0.5 12/01/2014 1802   GFRNONAA 48 (L) 12/01/2014 1802   GFRAA 55 (L) 12/01/2014 1802   Lab Results  Component Value Date   CHOL 195 08/27/2012   HDL 43 08/27/2012   LDLCALC 128 (H) 08/27/2012   TRIG 120 08/27/2012   CHOLHDL 4.5 08/27/2012   Lab Results  Component Value Date   HGBA1C 7.5 (H) 11/22/2014   Lab Results  Component Value Date   VITAMINB12 1,741 (H) 11/22/2014   Lab Results  Component Value Date   TSH 0.774 11/22/2014      ASSESSMENT AND PLAN 67 year old female with PMHx DM, HTN, infarct left frontal lobe(aca) with associated encephalomalacia, ischemic infarct involving the posterior limb of the right internal capsule, infarcts involving the left pons, and left globus pallidus. Sophia Rodriguez has right-sided spastic hemiparesis from left frontal stroke and left-sided weakness from right thalamic and right PLIC infarcts. Sophia Rodriguez continues to have gait difficulty but is improving and strength has improved as well. Sophia Rodriguez has been on ASA  since being discharged from stroke service in 2014. 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    PLAN:-Continue ASA 325 orally every day for secondary stroke prevention and maintain strict control of hypertension with blood pressure goal below 130/90, diabetes with hemoglobin A1c goal below 6.5% and lipids with LDL cholesterol goal below 70 mg/dL. Continue Lipitor  - Continue with Sophia Rodriguez  Botox - fall risk, advised to always use walker  -Pt says Sophia Rodriguez has order for PT will check with neuro rehab Sophia Rodriguez will check on the way out Follow up yearly Nilda Riggs, [facility], [facility], APRN  [facility] [address] [place], [place] [address] [phone]

## 2017-07-31 ENCOUNTER — Ambulatory Visit (INDEPENDENT_AMBULATORY_CARE_PROVIDER_SITE_OTHER): Payer: Medicare Other | Admitting: Nurse Practitioner

## 2017-07-31 ENCOUNTER — Encounter: Payer: Self-pay | Admitting: Nurse Practitioner

## 2017-07-31 VITALS — BP 119/78 | HR 92 | Ht 65.0 in | Wt 172.0 lb

## 2017-07-31 DIAGNOSIS — G8191 Hemiplegia, unspecified affecting right dominant side: Secondary | ICD-10-CM

## 2017-07-31 DIAGNOSIS — Z8673 Personal history of transient ischemic attack (TIA), and cerebral infarction without residual deficits: Secondary | ICD-10-CM

## 2017-07-31 DIAGNOSIS — R269 Unspecified abnormalities of gait and mobility: Secondary | ICD-10-CM | POA: Diagnosis not present

## 2017-07-31 DIAGNOSIS — I10 Essential (primary) hypertension: Secondary | ICD-10-CM | POA: Diagnosis not present

## 2017-07-31 NOTE — Patient Instructions (Signed)
Continue ASA 325 orally every day for secondary stroke prevention and maintain strict control of hypertension with blood pressure goal below 130/90, diabetes with hemoglobin A1c goal below 6.5% and lipids with LDL cholesterol goal below 70 mg/dL. Continue Lipitor  - Continue with Dr. Allena Katz  Botox - fall risk, advised to always use walker  -Pt says she has order for PT will check with neuro rehab on the way out Follow up yearly

## 2017-08-14 ENCOUNTER — Encounter: Payer: Self-pay | Admitting: Physical Therapy

## 2017-08-14 ENCOUNTER — Other Ambulatory Visit: Payer: Self-pay

## 2017-08-14 ENCOUNTER — Ambulatory Visit: Payer: Medicare Other | Attending: Physical Medicine & Rehabilitation | Admitting: Physical Therapy

## 2017-08-14 DIAGNOSIS — M6281 Muscle weakness (generalized): Secondary | ICD-10-CM

## 2017-08-14 DIAGNOSIS — I69951 Hemiplegia and hemiparesis following unspecified cerebrovascular disease affecting right dominant side: Secondary | ICD-10-CM | POA: Insufficient documentation

## 2017-08-14 DIAGNOSIS — R2681 Unsteadiness on feet: Secondary | ICD-10-CM | POA: Diagnosis present

## 2017-08-14 DIAGNOSIS — R2689 Other abnormalities of gait and mobility: Secondary | ICD-10-CM | POA: Insufficient documentation

## 2017-08-15 NOTE — Therapy (Signed)
[facility] [address] [place], [place], [address] Phone: [phone]   Fax:  [phone]  Physical Therapy Evaluation  Patient Details  Name: Sophia Rodriguez MRN: [id number] Date of Birth: 04/29/1950 Referring Provider: Maryla Morrow, MD   Encounter Date: 08/14/2017  PT End of Session - 08/15/17 0935    Visit Number  1    Number of Visits  13    Date for PT Re-Evaluation  10/10/17    Authorization Type  Medicare, BCBS secondary    Authorization Time Period  08/14/17 to 11/13/2017    PT Start Time  0844    PT Stop Time  0930    PT Time Calculation (min)  46 min    Activity Tolerance  Patient tolerated treatment well    Behavior During Therapy  [facility] for tasks assessed/performed       Past Medical History:  Diagnosis Date  . Arthritis   . Diabetes mellitus   . Hypertension   . Stroke [facility])     Past Surgical History:  Procedure Laterality Date  . HIP ARTHROPLASTY Right 11/24/2012   Procedure: RIGHT HIP HEMIARTHROPLASTY;  Surgeon: Verlee Rossetti, MD;  Location: [facility];  Service: Orthopedics;  Laterality: Right;  hemiatroplasty, DePuy Triloc    There were no vitals filed for this visit.   Subjective Assessment - 08/14/17 0849    Subjective  My endurance is not as good as it was when I finished my PT. I want to know if I can go without my brace (Dr. Allena Katz told me to ask). Saw the neurologist and she recommend I not use the cane because I felll in Feb 2019 in the grocery store (has been using her RW since thenz0. I still do my exercises when I remember (probably once every two weeks. Bridge, foot hanging over bed before I get up, leg lifts (LAQ) when seated). Feels like she is getting out of the house as much as before. Weight has been stable. No idea why her endurance has gotten worse.     Pertinent History  -Rt hip hemi; CVA x3; HTN DM    Patient Stated Goals  I want to get rid of the brace. I want to increase my  endurance. Return to pushing the cart at the grocery store.     Currently in Pain?  No/denies         [facility] PT Assessment - 08/14/17 0854      Assessment   Medical Diagnosis  spastic monoplegia LE with gait abnormality    Referring Provider  Maryla Morrow, MD    Onset Date/Surgical Date  -- most recent CVA 2014; most recent botox to RLE 07/22/17    Prior Therapy  last seen for PT in this clinic July-Oct 2018      Precautions   Precautions  Fall    Required Braces or Orthoses  Other Brace/Splint    Other Brace/Splint  uses Rt AFO       Balance Screen   Has the patient fallen in the past 6 months  Yes    How many times?  1    Has the patient had a decrease in activity level because of a fear of falling?   No    Is the patient reluctant to leave their home because of a fear of falling?   No      Home Nurse, mental health  Private residence    Living Arrangements  Alone  Available Help at Discharge  Personal care attendant    Type of Home  Apartment    Home Access  Level entry    Home Layout  One level    Home Equipment  Walker - 2 wheels;Gilmer Mor - single point      Prior Function   Level of Independence  Independent with household mobility with device;Independent with community mobility with device    Leisure  sewing (it took me awhile to get back into it);       Cognition   Overall Cognitive Status  History of cognitive impairments - at baseline decr memory    Memory  Impaired    Memory Impairment  Decreased long term memory;Decreased short term memory      Observation/Other Assessments   Observations  arrives using Rt AFO and 2 wheel RW    Focus on Therapeutic Outcomes (FOTO)   NA-chronic stroke      Coordination   Gross Motor Movements are Fluid and Coordinated  No    Fine Motor Movements are Fluid and Coordinated  No    Coordination and Movement Description  RLE impaired by spasticity      Tone   Assessment Location  Right Lower Extremity      ROM /  Strength   AROM / PROM / Strength  AROM;PROM;Strength      AROM   Overall AROM   Deficits    Overall AROM Comments  bil LEs WFL except rt ankle DF 10 degrees only      PROM   Overall PROM   Within functional limits for tasks performed full PROM rt ankle DF      Strength   Overall Strength  Deficits    Overall Strength Comments  RLE: knee extension 3+, knee flexion 3, ankle DF 4 (in available AROM)      Transfers   Transfers  Sit to Stand;Stand to Sit    Sit to Stand  6: Modified independent (Device/Increase time)    Stand to Sit  6: Modified independent (Device/Increase time)    Comments  incr time for set up with each      Ambulation/Gait   Ambulation/Gait  Yes    Ambulation/Gait Assistance  6: Modified independent (Device/Increase time)    Ambulation Distance (Feet)  75 Feet x 3; 20 x 4;     Assistive device  Rolling walker;Straight cane    Gait Pattern  Step-through pattern;Decreased step length - right;Decreased step length - left;Decreased hip/knee flexion - right;Decreased dorsiflexion - right;Right circumduction;Right foot flat varied with AFO vs without    Ambulation Surface  Indoor    Gait velocity  10 meter walk test (32.8 ft) WITHOUT AFO: RW 1.75 ft/sec ; cane 1.44 ft/sec;  WITH AFO: cane 1.5 ft/sec all below 1.81 ft/sec indicative of high fall risk      Standardized Balance Assessment   Standardized Balance Assessment  Timed Up and Go Test      Timed Up and Go Test   TUG Comments  WITHOUT AFO: 32.46 sec RW; 28.34 sec cane; WITH AFO: 28.87 sec RW; 26.62 sec cane      RLE Tone   RLE Tone  Mild;Hypertonic s/p botox to bil heads gastroc 07/22/17                Objective measurements completed on examination: See above findings.              PT Education - 08/15/17 [phone]    Education  Details  results of eval indicating every combination of walker vs cane; AFO vs no AFO showed walking speed below 1.81 ft/sec (indicates high fall risk) and her previous  velocity was 1.89 ft/sec with RW and AFO; results of multiple TUG also were slower than when she last finished PT (was 25.07 sec RW, AFO); Currently recommend use RW and AFO for safety. Educated that per her description, the main thing that changed after she stopped coming to PT was that she stopped regularly doing her HEP. She has to commit to doing her exercise program even when "graduates" from PT and insurance will not continue to pay for therapy if she chooses not to exercise.     Person(s) Educated  Patient    Methods  Explanation    Comprehension  Verbalized understanding       PT Short Term Goals - 08/15/17 0951      PT SHORT TERM GOAL #1   Title  Patient will be independent with HEP (Target for all STGs 09/12/2017)    Time  4    Period  Weeks    Status  New    Target Date  09/12/17      PT SHORT TERM GOAL #2   Title  Patient will ambulate 400 ft pushing simulated grocery cart vs RW to prepare for return to grocery shopping while pushing a cart (instead of riding an electric cart).     Time  4    Period  Weeks    Status  New      PT SHORT TERM GOAL #3   Title  Patient will improve gait velocity to >1.81 ft/sec (above threshold for high fall risk) with least restrictive combination of +/- AFO and cane vs RW.     Time  4    Period  Weeks    Status  New      PT SHORT TERM GOAL #4   Title  Patient will improve TUG to <25 sec with least restrictive combination of +/- AFO and cane vs RW.     Time  4    Period  Weeks    Status  New        PT Long Term Goals - 08/15/17 0955      PT LONG TERM GOAL #1   Title  Independent in updated HEP  and able to verbalize plan for continued community or home-based exercise program upon d/c from PT. (Target for all LTGs 10/10/2017)    Time  8    Period  Weeks    Status  New    Target Date  10/10/17      PT LONG TERM GOAL #2   Title  Patient will improve gait velocity to >=1.95 ft/sec with least restrictive combination of +/- AFO and  cance vs RW to indicate lesser fall risk.     Time  8    Period  Weeks    Status  New      PT LONG TERM GOAL #3   Title  Patient will ambulate 800 ft pushing simulated grocery cart (vs RW) modified independent to prepare for pushing grocery cart at store (instead of using electric cart).     Time  8    Period  Weeks    Status  New             Plan - 08/15/17 4098    Clinical Impression Statement  Patient known to this clinic with h/o CVAx3 (most recent  2014) and referred again by MD s/p recent botox injection to Rt gastrocs due to spastic monoplegia. Patient has shown a decline in her safety with ambulation since last seen for PT (as evidenced by fall in Feb 2019 and slower gait velocity and TUG results on today's evaluation). Long discussion re: patient's lack of committment to exercising after her discharge from PT in Oct 2018 and showed her velocity and times worsened from Oct 2018 to present day. She verbalized understanding and agreed to commit to doing her HEP as prescribed. Anticipate she can benefit from a short course of PT to re-establish her HEP and determine if she can improve her velocity once again to a safe speed using cane (her preference, rather than RW). Patient will benefit from the interventions listed below to address the deficits listed below.     History and Personal Factors relevant to plan of care:  PMH-Rt hip hemi; CVA x3; HTN DM; Personal factors-lives alone with limited support of aide to continue her exercise program; prior level of function (upon d/c Oct 2018); motivation;     Clinical Presentation  Stable    Clinical Presentation due to:  chronic conditions (although did have recent botox injections)    Clinical Decision Making  Low    Rehab Potential  Fair    Clinical Impairments Affecting Rehab Potential  decreased memory; prior h/o decr motivation for HEP    PT Frequency  2x / week 2x/wk x 4weeks, then 1x/wk x 4 weeks    PT Duration  4 weeks 2x/wk x  4weeks, then 1x/wk x 4 weeks    PT Treatment/Interventions  ADLs/Self Care Home Management;DME Instruction;Gait training;Cognitive remediation;Neuromuscular re-education;Balance training;Therapeutic exercise;Therapeutic activities;Functional mobility training;Stair training;Patient/family education;Orthotic Fit/Training;Manual techniques;Passive range of motion    PT Next Visit Plan  emphasize likely will not need/use entire 8 weeks of therapy and she must show improvements to continue; pull up HEP as of Oct 2018 and review (if still appropriate) Begin discussing her plan for exercise upon discharge    Consulted and Agree with Plan of Care  Patient       Patient will benefit from skilled therapeutic intervention in order to improve the following deficits and impairments:  Abnormal gait, Decreased activity tolerance, Decreased balance, Decreased cognition, Decreased mobility, Decreased endurance, Decreased coordination, Decreased range of motion, Decreased strength, Impaired UE functional use, Impaired tone  Visit Diagnosis: Other abnormalities of gait and mobility - Plan: PT plan of care cert/re-cert  Muscle weakness (generalized) - Plan: PT plan of care cert/re-cert  Unsteadiness on feet - Plan: PT plan of care cert/re-cert  Hemiplegia and hemiparesis following unspecified cerebrovascular disease affecting right dominant side (HCC) - Plan: PT plan of care cert/re-cert     Problem List Patient Active Problem List   Diagnosis Date Noted  . History of stroke 07/31/2017  . Muscle spasticity 10/04/2015  . 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 Right hemiparesis (HCC) 05/10/2014  . Abnormality of gait 05/10/2014  . Closed fracture of right hip (HCC) 11/24/2012  . 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 Stroke (HCC)   . Hypertension     Zena Amos, PT 08/15/2017, 10:05 AM  [facility] [address] [place], [place], [address] Phone: [phone]   Fax:  [phone]  Name: Sophia Rodriguez MRN: [id number] Date of Birth: [birth year]

## 2017-08-21 ENCOUNTER — Telehealth: Payer: Self-pay | Admitting: Physical Medicine & Rehabilitation

## 2017-08-21 DIAGNOSIS — G8191 Hemiplegia, unspecified affecting right dominant side: Secondary | ICD-10-CM

## 2017-08-21 DIAGNOSIS — R269 Unspecified abnormalities of gait and mobility: Secondary | ICD-10-CM

## 2017-08-21 NOTE — Telephone Encounter (Signed)
Pt needs referral for walker for PT therapist to order for her. She's doing her PT at [facility].  Thanks, Rosezella [place]

## 2017-08-22 NOTE — Telephone Encounter (Signed)
She has a walker and a cane.  Why does she need a new one.  Last time I saw her, she was not using the walker, but advanced to the cane.  Thanks.

## 2017-08-24 ENCOUNTER — Ambulatory Visit: Payer: Medicare Other | Attending: Physical Medicine & Rehabilitation | Admitting: Physical Therapy

## 2017-08-24 DIAGNOSIS — M6281 Muscle weakness (generalized): Secondary | ICD-10-CM | POA: Insufficient documentation

## 2017-08-24 DIAGNOSIS — R2689 Other abnormalities of gait and mobility: Secondary | ICD-10-CM | POA: Insufficient documentation

## 2017-08-24 DIAGNOSIS — R2681 Unsteadiness on feet: Secondary | ICD-10-CM | POA: Insufficient documentation

## 2017-08-25 NOTE — Telephone Encounter (Signed)
Spoke with patient.  Physical therapist is recommending primary use of walker versus cane.  Patient explains that her walker is 66 years old and is losing its structural integrity.

## 2017-08-25 NOTE — Telephone Encounter (Signed)
We may order her one.  Thanks.

## 2017-08-27 ENCOUNTER — Ambulatory Visit: Payer: Medicare Other | Admitting: Physical Therapy

## 2017-08-27 NOTE — Addendum Note (Signed)
Addended by: Doreene ElandSHUMAKER, SYBIL W on: 08/27/2017 09:32 AM   Modules accepted: Orders

## 2017-08-27 NOTE — Telephone Encounter (Signed)
Order placed and faxed to outpt neuro rehab

## 2017-08-31 ENCOUNTER — Ambulatory Visit: Payer: Medicare Other | Admitting: Physical Therapy

## 2017-08-31 ENCOUNTER — Encounter: Payer: Self-pay | Admitting: Physical Therapy

## 2017-08-31 DIAGNOSIS — R2681 Unsteadiness on feet: Secondary | ICD-10-CM | POA: Diagnosis present

## 2017-08-31 DIAGNOSIS — R2689 Other abnormalities of gait and mobility: Secondary | ICD-10-CM

## 2017-08-31 DIAGNOSIS — M6281 Muscle weakness (generalized): Secondary | ICD-10-CM | POA: Diagnosis present

## 2017-08-31 NOTE — Patient Instructions (Addendum)
FUNCTIONAL MOBILITY: Marching - Standing    March in place by lifting left leg up, then right. Alternate. __10_ reps per set, _1__ sets per day, _5__ days per week Hold onto a support.  USE YELLOW THERABAND  Copyright  VHI. All rights reserved.  Knee Flexion: Resisted (Sitting)    Sit with band under left foot and looped around ankle of supported leg. Pull unsupported leg back. Repeat __10_ times per set. Do _1___ sets per session. Do _1___ sessions per day. USE YELLOW BAND http://orth.exer.us/695   Copyright  VHI. All rights reserved.  Strengthening: Hip Abduction - Resisted    With tubing around right leg, other side toward anchor, extend leg out from side. Repeat __10__ times per set. Do _1___ sets per session. Do _1__ sessions per day.  http://orth.exer.us/635   Copyright  VHI. All rights reserved.  Strengthening: Hip Extension - Resisted    With tubing around right ankle, face anchor and pull leg straight back. Repeat _10___ times per set. Do _1__ sets per session. Do __1__ sessions per day.  http://orth.exer.us/637   Copyright  VHI. All rights reserved.  Strengthening: Hip Flexion - Resisted    With tubing around left ankle, anchor behind, bring leg forward, keeping knee straight. Repeat _10___ times per set. Do _1_ sets per session. Do __5__ sessions per day.   Feet Together, Head Motion - Eyes Closed      With eyes closed and feet together, hold for 30 seconds (no head movement), then move head slowly, up and down, then left and right. Repeat 10 times per session.    ** Updated due report of pt pain during stretch.  Advised patient to place stool or box under R leg when hanging off edge of bed to reduce intensity of stretch.  Hip Flexor Stretch    Lying on back right side near edge of bed, bend one leg, foot flat. Hang right leg over edge, relaxed, thigh resting entirely on bed for __1__ minutes. Repeat __3__ times. Do __1-2__ sessions per  day. Advanced Exercise: Bend knee back keeping thigh in contact with bed.   Functional Quadriceps: Sit to Stand    Sit on edge of chair, feet flat on floor. Stand upright, extending knees fully. Use arms as needed. Repeat _10_ times per set. Do _1 sets per session. Do _1-2_ sessions per day.  http://orth.exer.us/735   Copyright  VHI. All rights reserved.  Chair Sitting    Sit at edge of seat, spine straight, right leg extended. Put a hand on each thigh and bend forward from the hip, keeping spine straight. Allow hand on right leg to reach toward toes. Support upper body with other arm. Hold __60_ seconds. Repeat __3_ times per session. Do _1-2__ sessions per day.  Copyright  VHI. All rights reserved.  Bridge    Lie back, legs bent. Lift hips up off surface. Hold for 5 seconds. Repeat _10_ times. Do __1-2_ sessions per day.   .   Straight Leg: with Bent Knee (Supine)    With right leg straight, other leg bent, raise straight leg __2-3__ inches. Hold in air for 3-5 seconds, then slowly lower back down to mat/bed. Repeat _10___ times per set. Do _1_ sets per session. Do _1-2_ sessions per day.  When you go to the grocery store with your aide, push the grocery cart while she gets the items in the cart for you.

## 2017-08-31 NOTE — Therapy (Signed)
[facility] [address] [place], [place], [address] Phone: [phone]   Fax:  [phone]  Physical Therapy Treatment  Patient Details  Name: Sophia Rodriguez MRN: [id number] Date of Birth: [birth year] Referring Provider: Maryla MorrowPatel, Ankit, MD   Encounter Date: 08/31/2017  PT End of Session - 08/31/17 1152    Visit Number  2    Number of Visits  13    Date for PT Re-Evaluation  10/10/17    Authorization Type  Medicare, BCBS secondary    Authorization Time Period  08/14/17 to 11/13/2017    PT Start Time  0750    PT Stop Time  0835    PT Time Calculation (min)  45 min    Activity Tolerance  Patient tolerated treatment well    Behavior During Therapy  [facility] for tasks assessed/performed       Past Medical History:  Diagnosis Date  . Arthritis   . Diabetes mellitus   . Hypertension   . Stroke [facility](HCC)     Past Surgical History:  Procedure Laterality Date  . HIP ARTHROPLASTY Right 11/24/2012   Procedure: RIGHT HIP HEMIARTHROPLASTY;  Surgeon: Verlee RossettiSteven R Norris, MD;  Location: [facility];  Service: Orthopedics;  Laterality: Right;  hemiatroplasty, DePuy Triloc    There were no vitals filed for this visit.  Subjective Assessment - 08/31/17 0744    Subjective  I fell last night. I had taken my sleeping pill and was walking with my walker to the bed wihen I felt it kick in. I fell backwards. I stayed on the floor for 2 hours (I didn't want to bother my daughter). I finally crawled to the bathroom and pulled up on the toilet. She reports she worked on floor transfers during her last episode of PT and could not get off the floor by herself and ultimately they stopped working on floor transfers.     Pertinent History  -Rt hip hemi; CVA x3; HTN DM    Patient Stated Goals  I want to get rid of the brace. I want to increase my endurance. Return to pushing the cart at the grocery store.     Currently in Pain?  Yes    Pain Score  8     Pain  Location  Knee    Pain Orientation  Right    Pain Descriptors / Indicators  Sharp    Pain Onset  Yesterday    Pain Frequency  Intermittent    Aggravating Factors   weight bearing; no pain with flexion/extension    Pain Relieving Factors  sitting, pain medicine                       [facility] Adult PT Treatment/Exercise - 08/31/17 0931      Transfers   Transfers  Sit to Stand;Stand to Sit    Sit to Stand  6: Modified independent (Device/Increase time)    Stand to Sit  6: Modified independent (Device/Increase time)    Comments  incr time and pt placing more weight on her LLE due to RLE pain and weakness      Ambulation/Gait   Ambulation/Gait Assistance  6: Modified independent (Device/Increase time)    Ambulation Distance (Feet)  55 Feet lobby to gym    Assistive device  Rolling walker    Gait Pattern  Step-through pattern;Decreased step length - right;Decreased step length - left;Decreased hip/knee flexion - right;Decreased dorsiflexion - right;Right foot flat;Decreased stance time -  right;Decreased weight shift to right;Antalgic    Ambulation Surface  Indoor    Gait Comments  pt s/p fall last evening with Rt knee pain; she does not want to go get an xray; she misunderstood instructions at one point and put full weight on RLE while standing and lifting LLE off the ground with minimal wincing; not tender to palpation or testing lateral collateral ligament; pt issued new RW today (From [employer] storeroom) RW set for her height and in good working order; pt pleased with walker.      Exercises   Exercises  Knee/Hip      Knee/Hip Exercises: Stretches   Hip Flexor Stretch  Right;1 rep;30 seconds      Knee/Hip Exercises: Standing   Hip Flexion  Stengthening;Right;1 set;10 reps;Knee bent;Knee straight    Hip Abduction  Stengthening;Right;1 set;10 reps;Knee bent    Hip Extension  Stengthening;Right;1 set;10 reps;Knee bent      Knee/Hip Exercises: Supine   Straight Leg  Raises  Strengthening;Right;1 set;10 reps             PT Education - 08/31/17 1151    Education Details  cannot rule out fracture of RLE, however pt does not feel it is broken and does not want to get an xray; she did agree to let her daughter know that she fell; discussed keeping her sleeping pills near her bed so she won't have to walk after taking it.    Person(s) Educated  Patient    Methods  Explanation    Comprehension  Verbalized understanding       PT Short Term Goals - 08/15/17 0951      PT SHORT TERM GOAL #1   Title  Patient will be independent with HEP (Target for all STGs 09/12/2017)    Time  4    Period  Weeks    Status  New    Target Date  09/12/17      PT SHORT TERM GOAL #2   Title  Patient will ambulate 400 ft pushing simulated grocery cart vs RW to prepare for return to grocery shopping while pushing a cart (instead of riding an electric cart).     Time  4    Period  Weeks    Status  New      PT SHORT TERM GOAL #3   Title  Patient will improve gait velocity to >1.81 ft/sec (above threshold for high fall risk) with least restrictive combination of +/- AFO and cane vs RW.     Time  4    Period  Weeks    Status  New      PT SHORT TERM GOAL #4   Title  Patient will improve TUG to <25 sec with least restrictive combination of +/- AFO and cane vs RW.     Time  4    Period  Weeks    Status  New        PT Long Term Goals - 08/15/17 0955      PT LONG TERM GOAL #1   Title  Independent in updated HEP  and able to verbalize plan for continued community or home-based exercise program upon d/c from PT. (Target for all LTGs 10/10/2017)    Time  8    Period  Weeks    Status  New    Target Date  10/10/17      PT LONG TERM GOAL #2   Title  Patient will improve gait velocity  to >=1.95 ft/sec with least restrictive combination of +/- AFO and cance vs RW to indicate lesser fall risk.     Time  8    Period  Weeks    Status  New      PT LONG TERM GOAL #3    Title  Patient will ambulate 800 ft pushing simulated grocery cart (vs RW) modified independent to prepare for pushing grocery cart at store (instead of using electric cart).     Time  8    Period  Weeks    Status  New            Plan - 08/31/17 1154    Clinical Impression Statement  Session focused on providing 2 wheel RW from [employer] equipment closet. Walker set to her height. Printed HEP from prior PT episodes and reviewed which exercises she can do while her Rt knee is painful. She performed standing on LLE with RLE doing hip flexion, hip abdct, hip extension, and supine hip flexor stretch and RLE SLR. She did not experience knee pain with ROM or contracting quads in knee extension, only with weight bearing. Patient states she will let her daughter know and if pain worsens, they will consider getting an xray.     Rehab Potential  Fair    Clinical Impairments Affecting Rehab Potential  decreased memory; prior h/o decr motivation for HEP    PT Frequency  2x / week 2x/wk x 4weeks, then 1x/wk x 4 weeks    PT Duration  4 weeks 2x/wk x 4weeks, then 1x/wk x 4 weeks    PT Treatment/Interventions  ADLs/Self Care Home Management;DME Instruction;Gait training;Cognitive remediation;Neuromuscular re-education;Balance training;Therapeutic exercise;Therapeutic activities;Functional mobility training;Stair training;Patient/family education;Orthotic Fit/Training;Manual techniques;Passive range of motion    PT Next Visit Plan  check on her rt knee pain and ?back to wearing her AFO; review HEP given 6/10 and whether able to do both LES (if pain improved); Begin discussing her plan for exercise upon discharge (likely will not need/use entire 8 weeks of therapy)    Consulted and Agree with Plan of Care  Patient       Patient will benefit from skilled therapeutic intervention in order to improve the following deficits and impairments:  Abnormal gait, Decreased activity tolerance, Decreased balance,  Decreased cognition, Decreased mobility, Decreased endurance, Decreased coordination, Decreased range of motion, Decreased strength, Impaired UE functional use, Impaired tone  Visit Diagnosis: Other abnormalities of gait and mobility  Muscle weakness (generalized)  Unsteadiness on feet     Problem List Patient Active Problem List   Diagnosis Date Noted  . History of stroke 07/31/2017  . Muscle spasticity 10/04/2015  . 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 Right hemiparesis (HCC) 05/10/2014  . Abnormality of gait 05/10/2014  . Closed fracture of right hip (HCC) 11/24/2012  . 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 Stroke (HCC)   . Hypertension     Zena Amos, PT 08/31/2017, 12:05 PM  [facility] [address] [place], [place], [address] Phone: [phone]   Fax:  [phone]  Name: Sophia Rodriguez MRN: [id number] Date of Birth: [birth year]

## 2017-09-03 ENCOUNTER — Encounter: Payer: Self-pay | Admitting: Physical Therapy

## 2017-09-03 ENCOUNTER — Ambulatory Visit: Payer: Medicare Other | Admitting: Physical Therapy

## 2017-09-03 DIAGNOSIS — M6281 Muscle weakness (generalized): Secondary | ICD-10-CM

## 2017-09-03 DIAGNOSIS — R2689 Other abnormalities of gait and mobility: Secondary | ICD-10-CM | POA: Diagnosis not present

## 2017-09-03 NOTE — Therapy (Signed)
[facility] [address] [place], [place], [address] Phone: [phone]   Fax:  [phone]  Physical Therapy Treatment  Patient Details  Name: Sophia Rodriguez MRN: [id number] Date of Birth: 13-Sep-1950 Referring Provider: Maryla Morrow, MD   Encounter Date: 09/03/2017  PT End of Session - 09/03/17 0813    Visit Number  3    Number of Visits  13    Date for PT Re-Evaluation  10/10/17    Authorization Type  Medicare, BCBS secondary    Authorization Time Period  08/14/17 to 11/13/2017    PT Start Time  0745    PT Stop Time  0830    PT Time Calculation (min)  45 min    Activity Tolerance  Patient tolerated treatment well    Behavior During Therapy  [facility] for tasks assessed/performed       Past Medical History:  Diagnosis Date  . Arthritis   . Diabetes mellitus   . Hypertension   . Stroke [facility])     Past Surgical History:  Procedure Laterality Date  . HIP ARTHROPLASTY Right 11/24/2012   Procedure: RIGHT HIP HEMIARTHROPLASTY;  Surgeon: Verlee Rossetti, MD;  Location: [facility];  Service: Orthopedics;  Laterality: Right;  hemiatroplasty, DePuy Triloc    There were no vitals filed for this visit.  Subjective Assessment - 09/03/17 0749    Subjective  States she's been doing exercises. Her leg is still sore but improved. She's been using ice and elevating.     Pertinent History  -Rt hip hemi; CVA x3; HTN DM    Patient Stated Goals  I want to get rid of the brace. I want to increase my endurance. Return to pushing the cart at the grocery store.     Currently in Pain?  Yes    Pain Score  6     Pain Location  Knee    Pain Orientation  Right    Pain Descriptors / Indicators  Stabbing    Pain Onset  Yesterday    Pain Frequency  Intermittent    Aggravating Factors   weight-bearing    Pain Relieving Factors  sitting, pain medicine                       [facility] Adult PT Treatment/Exercise - 09/03/17 0808      Knee/Hip Exercises: Seated   Long Arc Quad  10 reps;Weights;Left;Strengthening;Right 2    Long Arc Quad Weight  2 lbs. on rt; 4# on lt    Hamstring Curl  Strengthening;Right;1 set;10 reps red band, 3 sec hold      Knee/Hip Exercises: Supine   Straight Leg Raises  Right;Strengthening attempted with 1# weight with incr knee pain    Other Supine Knee/Hip Exercises  clam with red band; one leg stable and other leg presses out; bi lx 10 reps    Other Supine Knee/Hip Exercises  hooklying red band around thighs, hip flexion (knee to chest) x 10 bil      Knee/Hip Exercises: Sidelying   Clams  lifting RLE AAROM and pt lowering; 5 reps only as pt reported her entire rt side begins to hurt    Other Sidelying Knee/Hip Exercises  lying on rt, clams with red band x 10    Other Sidelying Knee/Hip Exercises  lying on rt, PT placing LLE in various positions with pt holding LLE still for 3-5 seconds to cause RLE to stabilize against the surface x 10  reps               PT Short Term Goals - 08/15/17 0951      PT SHORT TERM GOAL #1   Title  Patient will be independent with HEP (Target for all STGs 09/12/2017)    Time  4    Period  Weeks    Status  New    Target Date  09/12/17      PT SHORT TERM GOAL #2   Title  Patient will ambulate 400 ft pushing simulated grocery cart vs RW to prepare for return to grocery shopping while pushing a cart (instead of riding an electric cart).     Time  4    Period  Weeks    Status  New      PT SHORT TERM GOAL #3   Title  Patient will improve gait velocity to >1.81 ft/sec (above threshold for high fall risk) with least restrictive combination of +/- AFO and cane vs RW.     Time  4    Period  Weeks    Status  New      PT SHORT TERM GOAL #4   Title  Patient will improve TUG to <25 sec with least restrictive combination of +/- AFO and cane vs RW.     Time  4    Period  Weeks    Status  New        PT Long Term Goals - 08/15/17 0955      PT LONG TERM  GOAL #1   Title  Independent in updated HEP  and able to verbalize plan for continued community or home-based exercise program upon d/c from PT. (Target for all LTGs 10/10/2017)    Time  8    Period  Weeks    Status  New    Target Date  10/10/17      PT LONG TERM GOAL #2   Title  Patient will improve gait velocity to >=1.95 ft/sec with least restrictive combination of +/- AFO and cance vs RW to indicate lesser fall risk.     Time  8    Period  Weeks    Status  New      PT LONG TERM GOAL #3   Title  Patient will ambulate 800 ft pushing simulated grocery cart (vs RW) modified independent to prepare for pushing grocery cart at store (instead of using electric cart).     Time  8    Period  Weeks    Status  New            Plan - 09/03/17 0818    Clinical Impression Statement  Patient continues to have rt knee pain (better and more localized to posterior-lateral area (just medial to hamstring). Pain when weightbearing primarily, but also with resisted PF with knee extended (?gastroc tendon strain vs fluid/swelling pressing on posterior knee structures--although pain not recreated with activating hamstrings). Session focused on bil LE strengthening in supine, sidelying and sitting to pt's tolerance (if recreated pain, would not continue with that exercise). Discussed ?need for xray and do feel we were able to localize pain and tenderness to soft tissues, therefore OK with pt continuing ice and elevation through the weekend and then consider xray.     Rehab Potential  Fair    Clinical Impairments Affecting Rehab Potential  decreased memory; prior h/o decr motivation for HEP    PT Frequency  2x / week 2x/wk x 4weeks, then  1x/wk x 4 weeks    PT Duration  4 weeks 2x/wk x 4weeks, then 1x/wk x 4 weeks    PT Treatment/Interventions  ADLs/Self Care Home Management;DME Instruction;Gait training;Cognitive remediation;Neuromuscular re-education;Balance training;Therapeutic exercise;Therapeutic  activities;Functional mobility training;Stair training;Patient/family education;Orthotic Fit/Training;Manual techniques;Passive range of motion    PT Next Visit Plan  check on her rt knee pain and ?back to wearing her AFO; review HEP given 6/10 and whether able to do both LES (if pain improved); Begin discussing her plan for exercise upon discharge (likely will not need/use entire 8 weeks of therapy)    Consulted and Agree with Plan of Care  Patient       Patient will benefit from skilled therapeutic intervention in order to improve the following deficits and impairments:  Abnormal gait, Decreased activity tolerance, Decreased balance, Decreased cognition, Decreased mobility, Decreased endurance, Decreased coordination, Decreased range of motion, Decreased strength, Impaired UE functional use, Impaired tone  Visit Diagnosis: Muscle weakness (generalized)     Problem List Patient Active Problem List   Diagnosis Date Noted  . History of stroke 07/31/2017  . Muscle spasticity 10/04/2015  . 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 Right hemiparesis (HCC) 05/10/2014  . Abnormality of gait 05/10/2014  . Closed fracture of right hip (HCC) 11/24/2012  . 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 Stroke (HCC)   . Hypertension     Zena AmosLynn P Lidiya Reise, PT 09/03/2017, 8:44 AM  [facility] [address] [place], [place], [address] Phone: [phone]   Fax:  [phone]  Name: Sophia Rodriguez MRN: [id number] Date of Birth: 06/27/1950

## 2017-09-07 ENCOUNTER — Encounter: Payer: Self-pay | Admitting: Physical Therapy

## 2017-09-07 ENCOUNTER — Ambulatory Visit: Payer: Medicare Other | Admitting: Physical Therapy

## 2017-09-07 DIAGNOSIS — R2689 Other abnormalities of gait and mobility: Secondary | ICD-10-CM | POA: Diagnosis not present

## 2017-09-07 DIAGNOSIS — R2681 Unsteadiness on feet: Secondary | ICD-10-CM

## 2017-09-07 DIAGNOSIS — M6281 Muscle weakness (generalized): Secondary | ICD-10-CM

## 2017-09-07 NOTE — Therapy (Signed)
[facility] [address] [place], [place], [address] Phone: [phone]   Fax:  [phone]  Physical Therapy Treatment  Patient Details  Name: Sophia Rodriguez MRN: [id number] Date of Birth: April 07, 1950 Referring Provider: Maryla Morrow, MD   Encounter Date: 09/07/2017  PT End of Session - 09/07/17 0809    Visit Number  4    Number of Visits  13    Date for PT Re-Evaluation  10/10/17    Authorization Type  Medicare, BCBS secondary    Authorization Time Period  08/14/17 to 11/13/2017    PT Start Time  0800    PT Stop Time  0840    PT Time Calculation (min)  40 min    Activity Tolerance  Patient tolerated treatment well    Behavior During Therapy  [facility] for tasks assessed/performed       Past Medical History:  Diagnosis Date  . Arthritis   . Diabetes mellitus   . Hypertension   . Stroke [facility])     Past Surgical History:  Procedure Laterality Date  . HIP ARTHROPLASTY Right 11/24/2012   Procedure: RIGHT HIP HEMIARTHROPLASTY;  Surgeon: Verlee Rossetti, MD;  Location: [facility];  Service: Orthopedics;  Laterality: Right;  hemiatroplasty, DePuy Triloc    There were no vitals filed for this visit.  Subjective Assessment - 09/07/17 0802    Subjective  States she has had hardly any knee pain (only if she steps a certain way). Is still having difficulty with HEP when standing on her RLE.    Pertinent History  -Rt hip hemi; CVA x3; HTN DM    Patient Stated Goals  I want to get rid of the brace. I want to increase my endurance. Return to pushing the cart at the grocery store.     Currently in Pain?  No/denies                       [facility] Adult PT Treatment/Exercise - 09/07/17 0808      Ambulation/Gait   Ambulation/Gait Assistance  5: Supervision    Ambulation/Gait Assistance Details  using Rt AFO today; better foot clearanace with cues to attend to rt heelstrike    Ambulation Distance (Feet)  80 Feet 60, 60    Assistive device  Rolling walker    Gait Pattern  Step-through pattern;Decreased step length - right;Decreased step length - left;Decreased hip/knee flexion - right;Decreased dorsiflexion - right;Right foot flat;Decreased stance time - right;Decreased weight shift to right;Antalgic    Ambulation Surface  Indoor      Knee/Hip Exercises: Aerobic   Nustep  L2 with all 4 extremities x 2 min; L3 x 6 minutes 40-50 steps/min      Knee/Hip Exercises: Machines for Strengthening   Cybex Leg Press  40# RLE only x 10 reps;rest x 5 reps      Knee/Hip Exercises: Standing   Hip Flexion  Stengthening;Right;1 set;10 reps 1# with no incr knee pain    Hip Abduction  Stengthening;Both;1 set;20 reps    Hip Extension  Stengthening;Both;1 set;20 reps    Other Standing Knee Exercises  kicking 1 lb ball with RLE while walking x 5; kicking bean bag x 20 ft                PT Short Term Goals - 08/15/17 0951      PT SHORT TERM GOAL #1   Title  Patient will be independent with HEP (Target for all  STGs 09/12/2017)    Time  4    Period  Weeks    Status  New    Target Date  09/12/17      PT SHORT TERM GOAL #2   Title  Patient will ambulate 400 ft pushing simulated grocery cart vs RW to prepare for return to grocery shopping while pushing a cart (instead of riding an electric cart).     Time  4    Period  Weeks    Status  New      PT SHORT TERM GOAL #3   Title  Patient will improve gait velocity to >1.81 ft/sec (above threshold for high fall risk) with least restrictive combination of +/- AFO and cane vs RW.     Time  4    Period  Weeks    Status  New      PT SHORT TERM GOAL #4   Title  Patient will improve TUG to <25 sec with least restrictive combination of +/- AFO and cane vs RW.     Time  4    Period  Weeks    Status  New        PT Long Term Goals - 08/15/17 0955      PT LONG TERM GOAL #1   Title  Independent in updated HEP  and able to verbalize plan for continued community or  home-based exercise program upon d/c from PT. (Target for all LTGs 10/10/2017)    Time  8    Period  Weeks    Status  New    Target Date  10/10/17      PT LONG TERM GOAL #2   Title  Patient will improve gait velocity to >=1.95 ft/sec with least restrictive combination of +/- AFO and cance vs RW to indicate lesser fall risk.     Time  8    Period  Weeks    Status  New      PT LONG TERM GOAL #3   Title  Patient will ambulate 800 ft pushing simulated grocery cart (vs RW) modified independent to prepare for pushing grocery cart at store (instead of using electric cart).     Time  8    Period  Weeks    Status  New            Plan - 09/07/17 0810    Clinical Impression Statement  Patient reports no rt knee pain today. Focus on strengthening and gait training with emphasis on clearing Rt foot (tends to drag when not attending to her RLE, but with focus she can clear rt foot with no drag). Educated on [facility] (thru [employer] [facility] as an option for continued exercise upon complettion of PT sessions.     Rehab Potential  Fair    Clinical Impairments Affecting Rehab Potential  decreased memory; prior h/o decr motivation for HEP    PT Frequency  2x / week 2x/wk x 4weeks, then 1x/wk x 4 weeks    PT Duration  4 weeks 2x/wk x 4weeks, then 1x/wk x 4 weeks    PT Treatment/Interventions  ADLs/Self Care Home Management;DME Instruction;Gait training;Cognitive remediation;Neuromuscular re-education;Balance training;Therapeutic exercise;Therapeutic activities;Functional mobility training;Stair training;Patient/family education;Orthotic Fit/Training;Manual techniques;Passive range of motion    PT Next Visit Plan  See if she was able to push grocery cart thru store on Wed; RLE strengthening and gait training with AFO; Begin discussing her plan for exercise upon discharge (likely will not need/use entire  8 weeks of therapy)    Consulted and Agree with Plan of Care  Patient       Patient  will benefit from skilled therapeutic intervention in order to improve the following deficits and impairments:  Abnormal gait, Decreased activity tolerance, Decreased balance, Decreased cognition, Decreased mobility, Decreased endurance, Decreased coordination, Decreased range of motion, Decreased strength, Impaired UE functional use, Impaired tone  Visit Diagnosis: Muscle weakness (generalized)  Other abnormalities of gait and mobility  Unsteadiness on feet     Problem List Patient Active Problem List   Diagnosis Date Noted  . History of stroke 07/31/2017  . Muscle spasticity 10/04/2015  . 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 Right hemiparesis (HCC) 05/10/2014  . Abnormality of gait 05/10/2014  . Closed fracture of right hip (HCC) 11/24/2012  . 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 Stroke (HCC)   . Hypertension     Zena Amos, PT 09/07/2017, 8:47 AM  [facility] [address] [place], [place], [address] Phone: [phone]   Fax:  [phone]  Name: Braedyn Riggle MRN: [id number] Date of Birth: November 22, 1950

## 2017-09-11 ENCOUNTER — Ambulatory Visit: Payer: Medicare Other | Admitting: Rehabilitation

## 2017-09-15 ENCOUNTER — Ambulatory Visit: Payer: Medicare Other | Admitting: Physical Therapy

## 2017-09-15 ENCOUNTER — Encounter: Payer: Self-pay | Admitting: Physical Therapy

## 2017-09-15 DIAGNOSIS — R2689 Other abnormalities of gait and mobility: Secondary | ICD-10-CM | POA: Diagnosis not present

## 2017-09-15 DIAGNOSIS — M6281 Muscle weakness (generalized): Secondary | ICD-10-CM

## 2017-09-15 DIAGNOSIS — R2681 Unsteadiness on feet: Secondary | ICD-10-CM

## 2017-09-15 NOTE — Therapy (Signed)
[facility] [address] [place], [place], [address] Phone: [phone]   Fax:  [phone]  Physical Therapy Treatment  Patient Details  Name: Sophia Rodriguez MRN: [id number] Date of Birth: 24-Aug-1950 Referring Provider: Delice Lesch, MD   Encounter Date: 09/15/2017  PT End of Session - 09/15/17 0906    Visit Number  5    Number of Visits  13    Date for PT Re-Evaluation  10/10/17    Authorization Type  Medicare, BCBS secondary    Authorization Time Period  08/14/17 to 11/13/2017    PT Start Time  0800    PT Stop Time  0847    PT Time Calculation (min)  47 min    Activity Tolerance  Patient tolerated treatment well    Behavior During Therapy  [facility] for tasks assessed/performed       Past Medical History:  Diagnosis Date  . Arthritis   . Diabetes mellitus   . Hypertension   . Stroke [facility])     Past Surgical History:  Procedure Laterality Date  . HIP ARTHROPLASTY Right 11/24/2012   Procedure: RIGHT HIP HEMIARTHROPLASTY;  Surgeon: Augustin Schooling, MD;  Location: [facility];  Service: Orthopedics;  Laterality: Right;  hemiatroplasty, DePuy Triloc    There were no vitals filed for this visit.  Subjective Assessment - 09/15/17 0758    Subjective  Knee pain much better. No falls or near falls. Has been doing HEP 3x/wk. Was able to push grocery cart x 3 aisles.     Pertinent History  -Rt hip hemi; CVA x3; HTN DM    Patient Stated Goals  I want to get rid of the brace. I want to increase my endurance. Return to pushing the cart at the grocery store.     Currently in Pain?  No/denies         [facility] PT Assessment - 09/15/17 0816      Ambulation/Gait   Gait velocity  --      Timed Up and Go Test   TUG  Normal TUG    Normal TUG (seconds)  27.4 with AFO, and RW                   [facility] Adult PT Treatment/Exercise - 09/15/17 0816      Ambulation/Gait   Ambulation/Gait Assistance  5: Supervision    Ambulation/Gait Assistance Details  using Rt AFO today; better foot clearanace with cues to attend to rt heelstrike    Ambulation Distance (Feet)  500 Feet 80, 80, 50, 50     Assistive device  Rolling walker    Gait Pattern  Step-through pattern;Decreased step length - right;Decreased step length - left;Decreased hip/knee flexion - right;Decreased dorsiflexion - right;Right foot flat;Decreased stance time - right;Decreased weight shift to right;Poor foot clearance - right    Ambulation Surface  Level;Indoor      Self-Care   Other Self-Care Comments   Reviewed pt's current HEP and provided environmental suggestions for better ways to do the ex's at home safely. Decided with pt that she will try to do ex's while her CNA is present for safety reasons and that she will try to perform them in her kitchen at a countertop for increased stability.       Knee/Hip Exercises: Stretches   Active Hamstring Stretch  Right;1 rep;20 seconds    Gastroc Stretch  Right;1 rep;30 seconds PROM to assess ROM (~10 DF)      Knee/Hip  Exercises: Standing   Hip Abduction  Stengthening;Both;1 set;10 reps    Abduction Limitations  with RW; vc for neutral hip rotation and taking leg slightly posterior to decrease use of hip flexors             PT Education - 09/15/17 1000    Education Details  Began to review current  HEP (pt has been limited in doing these exercises since fall with rt knee pain). Provided red band for standing hip exercises and discussed trying exercises at kitchen counter (begin seated in chair to don band). She reports she can have her aide move a chair into the kitchen for this. Discussed other option of using her walker in front of her chair in den, but disadvantage of hip abdct and foot catching on RW. ?with aide could turn RW sideways and hold one handle in both hands while aide anchors other side of RW (vs having to take a chair into her kitchen).     Person(s) Educated  Patient    Methods   Explanation;Demonstration    Comprehension  Verbalized understanding;Returned demonstration;Need further instruction       PT Short Term Goals - 09/15/17 0907      PT SHORT TERM GOAL #1   Title  Patient will be independent with HEP (Target for all STGs 09/12/2017)    Baseline  09/15/2017 Pt needs cues to perform correctly.    Time  4    Period  Weeks    Status  Partially Met      PT SHORT TERM GOAL #2   Title  Patient will ambulate 400 ft pushing simulated grocery cart vs RW to prepare for return to grocery shopping while pushing a cart (instead of riding an electric cart).     Baseline  09/15/2017 Met today.    Time  4    Period  Weeks    Status  Achieved      PT SHORT TERM GOAL #3   Title  Patient will improve gait velocity to >1.81 ft/sec (above threshold for high fall risk) with least restrictive combination of +/- AFO and cane vs RW.     Baseline  09/15/2017 No comparison for baseline, 1.74 ft/sec AFO w/ RW scored today.    Time  4    Period  Weeks    Status  Partially Met      PT SHORT TERM GOAL #4   Title  Patient will improve TUG to <25 sec with least restrictive combination of +/- AFO and cane vs RW.     Baseline  09/15/2017 27.4 sec w/ AFO and RW scored today, mild improvement but not to goal.    Time  4    Period  Weeks    Status  Partially Met        PT Long Term Goals - 08/15/17 0955      PT LONG TERM GOAL #1   Title  Independent in updated HEP  and able to verbalize plan for continued community or home-based exercise program upon d/c from PT. (Target for all LTGs 10/10/2017)    Time  8    Period  Weeks    Status  New    Target Date  10/10/17      PT LONG TERM GOAL #2   Title  Patient will improve gait velocity to >=1.95 ft/sec with least restrictive combination of +/- AFO and cance vs RW to indicate lesser fall risk.     Time  8    Period  Weeks    Status  New      PT LONG TERM GOAL #3   Title  Patient will ambulate 800 ft pushing simulated grocery cart  (vs RW) modified independent to prepare for pushing grocery cart at store (instead of using electric cart).     Time  8    Period  Weeks    Status  New            Plan - 09/15/17 0906    Clinical Impression Statement  Today's session focused on progress toward STG's. One goal was achieved and progress was made toward the other 3 STG's. Pt. will benefit from continued PT to continue making progress toward her remaining goals.    Rehab Potential  Fair    Clinical Impairments Affecting Rehab Potential  decreased memory; prior h/o decr motivation for HEP    PT Frequency  2x / week 2x/wk x 4weeks, then 1x/wk x 4 weeks    PT Duration  4 weeks 2x/wk x 4weeks, then 1x/wk x 4 weeks    PT Treatment/Interventions  ADLs/Self Care Home Management;DME Instruction;Gait training;Cognitive remediation;Neuromuscular re-education;Balance training;Therapeutic exercise;Therapeutic activities;Functional mobility training;Stair training;Patient/family education;Orthotic Fit/Training;Manual techniques;Passive range of motion    PT Next Visit Plan  review HEP and update (gave red band to begin using with standing hip ex's last visit, but did not get to practice)--she had been limited recently due to knee pain; RLE strengthening and gait training with AFO; Begin discussing her plan for exercise upon discharge (likely will not need/use entire 8 weeks of therapy)    Consulted and Agree with Plan of Care  Patient       Patient will benefit from skilled therapeutic intervention in order to improve the following deficits and impairments:  Abnormal gait, Decreased activity tolerance, Decreased balance, Decreased cognition, Decreased mobility, Decreased endurance, Decreased coordination, Decreased range of motion, Decreased strength, Impaired UE functional use, Impaired tone  Visit Diagnosis: Other abnormalities of gait and mobility  Muscle weakness (generalized)  Unsteadiness on feet     Problem List Patient  Active Problem List   Diagnosis Date Noted  . History of stroke 07/31/2017  . Muscle spasticity 10/04/2015  . 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 Right hemiparesis ([place]) 05/10/2014  . Abnormality of gait 05/10/2014  . Closed fracture of right hip ([place]) 11/24/2012  . Thrombotic cerebral infarction ([place]) 09/23/2012  . DM type 2 causing neurological disease ([place]) 08/26/2012  . Stroke ([place])   . Hypertension     Arthor Captain, SPTA 09/15/2017, 10:09 AM  [facility] [address] [place], [place], [address] Phone: [phone]   Fax:  [phone]  Name: Sophia Rodriguez MRN: [id number] Date of Birth: March 13, 1951

## 2017-09-18 ENCOUNTER — Ambulatory Visit: Payer: Medicare Other | Admitting: Physical Therapy

## 2017-09-18 ENCOUNTER — Encounter: Payer: Self-pay | Admitting: Physical Therapy

## 2017-09-18 DIAGNOSIS — R2689 Other abnormalities of gait and mobility: Secondary | ICD-10-CM

## 2017-09-18 DIAGNOSIS — M6281 Muscle weakness (generalized): Secondary | ICD-10-CM

## 2017-09-18 NOTE — Patient Instructions (Signed)
   HIP: Abduction - Standing (Band)    Sitting and place band around ankles. With walker turned sideways and someone holding down the other side of walker, raise leg slowly out to the side and slowly lower it back down. _10__ reps per set, __1_ sets per day, __5_ days per week    EXTENSION: Standing - Resistance Band (Active)    Stand with band around your ankles and holding walker like normal. Against band, draw right leg slowly behind body as far as possible. Allow leg to slowly return to start position. Complete _1__ sets of _10__ repetitions. Perform __1_ sessions per day.   **Both above exercises can be made more difficult by alternating lifting each leg. (Lift left, then lift right, then left)

## 2017-09-19 NOTE — Therapy (Signed)
[facility] [address] [place], [place], [address] Phone: [phone]   Fax:  [phone]  Physical Therapy Treatment  Patient Details  Name: Sophia Rodriguez MRN: [id number] Date of Birth: 05-12-1950 Referring Provider: Delice Lesch, MD   Encounter Date: 09/18/2017  PT End of Session - 09/18/17 2018    Visit Number  6    Number of Visits  13    Date for PT Re-Evaluation  10/10/17    Authorization Type  Medicare, BCBS secondary    Authorization Time Period  08/14/17 to 11/13/2017    PT Start Time  0801    PT Stop Time  0845    PT Time Calculation (min)  44 min    Activity Tolerance  Patient tolerated treatment well    Behavior During Therapy  [facility] for tasks assessed/performed       Past Medical History:  Diagnosis Date  . Arthritis   . Diabetes mellitus   . Hypertension   . Stroke [facility])     Past Surgical History:  Procedure Laterality Date  . HIP ARTHROPLASTY Right 11/24/2012   Procedure: RIGHT HIP HEMIARTHROPLASTY;  Surgeon: Augustin Schooling, MD;  Location: [facility];  Service: Orthopedics;  Laterality: Right;  hemiatroplasty, DePuy Triloc    There were no vitals filed for this visit.  Subjective Assessment - 09/18/17 0804    Subjective  No changes. Has been doing her exercises and wonders if her band is too tight. Knee not really bothering her anymore.     Pertinent History  -Rt hip hemi; CVA x3; HTN DM    Patient Stated Goals  I want to get rid of the brace. I want to increase my endurance. Return to pushing the cart at the grocery store.     Currently in Pain?  No/denies                       [facility] Adult PT Treatment/Exercise - 09/18/17 0845      Ambulation/Gait   Ambulation/Gait Assistance  5: Supervision;6: Modified independent (Device/Increase time)    Ambulation/Gait Assistance Details  vc intermittently for RLE clearance (none at beginning of session--more as she fatigued)     Ambulation Distance (Feet)  120 Feet x2    Assistive device  Rolling walker    Gait Pattern  Step-through pattern;Decreased step length - right;Decreased step length - left;Decreased hip/knee flexion - right;Decreased dorsiflexion - right;Right foot flat;Decreased stance time - right;Decreased weight shift to right;Poor foot clearance - right    Ambulation Surface  Level;Indoor    Pre-Gait Activities  kicking bean bag with rt foot for improved foot clearance; alternating step taps touching bottom step with her heels      Knee/Hip Exercises: Standing   Hip Abduction  Stengthening;Both;1 set;10 reps red band; alternating legs more difficult    Abduction Limitations  with RW; vc for neutral hip rotation and taking leg slightly posterior to decrease use of hip flexors    Hip Extension  Stengthening;Both;1 set;20 reps    Extension Limitations  with red band; vc for slow, controlled movement     forward step ups on 6" step with bil UE support on rails, RLE leads up and lowers down x 15 reps     Balance Exercises - 09/18/17 2016      Balance Exercises: Standing   Standing Eyes Opened  Wide (BOA);Foam/compliant surface;Head turns 1 inch foam; intermittent UE support    Stepping Strategy  Anterior;Posterior;Foam/compliant surface step off/on 1" foam square, alternating legs          PT Short Term Goals - 09/15/17 0907      PT SHORT TERM GOAL #1   Title  Patient will be independent with HEP (Target for all STGs 09/12/2017)    Baseline  09/15/2017 Pt needs cues to perform correctly.    Time  4    Period  Weeks    Status  Partially Met      PT SHORT TERM GOAL #2   Title  Patient will ambulate 400 ft pushing simulated grocery cart vs RW to prepare for return to grocery shopping while pushing a cart (instead of riding an electric cart).     Baseline  09/15/2017 Met today.    Time  4    Period  Weeks    Status  Achieved      PT SHORT TERM GOAL #3   Title  Patient will improve gait velocity  to >1.81 ft/sec (above threshold for high fall risk) with least restrictive combination of +/- AFO and cane vs RW.     Baseline  09/15/2017 No comparison for baseline, 1.74 ft/sec AFO w/ RW scored today.    Time  4    Period  Weeks    Status  Partially Met      PT SHORT TERM GOAL #4   Title  Patient will improve TUG to <25 sec with least restrictive combination of +/- AFO and cane vs RW.     Baseline  09/15/2017 27.4 sec w/ AFO and RW scored today, mild improvement but not to goal.    Time  4    Period  Weeks    Status  Partially Met        PT Long Term Goals - 08/15/17 0955      PT LONG TERM GOAL #1   Title  Independent in updated HEP  and able to verbalize plan for continued community or home-based exercise program upon d/c from PT. (Target for all LTGs 10/10/2017)    Time  8    Period  Weeks    Status  New    Target Date  10/10/17      PT LONG TERM GOAL #2   Title  Patient will improve gait velocity to >=1.95 ft/sec with least restrictive combination of +/- AFO and cance vs RW to indicate lesser fall risk.     Time  8    Period  Weeks    Status  New      PT LONG TERM GOAL #3   Title  Patient will ambulate 800 ft pushing simulated grocery cart (vs RW) modified independent to prepare for pushing grocery cart at store (instead of using electric cart).     Time  8    Period  Weeks    Status  New            Plan - 09/18/17 2023    Clinical Impression Statement  Session focused on pre-gait and gait training with good carryover of improved rt foot clearance after activities. Remainder of time focused on balance training with focus on pt able to equally weight bear through bil LEs (she tends to center herself primarily over her LLE) to assist with incr time in stance/wt-bearing during gait. Discussed pt's progress and her plan for after discharge from PT. She plans to go to the [facility] for the exercise classes and/or to use their equipment.  Rehab Potential  Fair     Clinical Impairments Affecting Rehab Potential  decreased memory; prior h/o decr motivation for HEP    PT Frequency  2x / week 2x/wk x 4weeks, then 1x/wk x 4 weeks    PT Duration  4 weeks 2x/wk x 4weeks, then 1x/wk x 4 weeks    PT Treatment/Interventions  ADLs/Self Care Home Management;DME Instruction;Gait training;Cognitive remediation;Neuromuscular re-education;Balance training;Therapeutic exercise;Therapeutic activities;Functional mobility training;Stair training;Patient/family education;Orthotic Fit/Training;Manual techniques;Passive range of motion    PT Next Visit Plan  RLE strengthening and gait training (especially increased time/distance with goal to walk 800 ft); (likely will not need/use entire 8 weeks of therapy)    Consulted and Agree with Plan of Care  Patient       Patient will benefit from skilled therapeutic intervention in order to improve the following deficits and impairments:  Abnormal gait, Decreased activity tolerance, Decreased balance, Decreased cognition, Decreased mobility, Decreased endurance, Decreased coordination, Decreased range of motion, Decreased strength, Impaired UE functional use, Impaired tone  Visit Diagnosis: Other abnormalities of gait and mobility  Muscle weakness (generalized)     Problem List Patient Active Problem List   Diagnosis Date Noted  . History of stroke 07/31/2017  . Muscle spasticity 10/04/2015  . 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 Right hemiparesis ([place]) 05/10/2014  . Abnormality of gait 05/10/2014  . Closed fracture of right hip ([place]) 11/24/2012  . Thrombotic cerebral infarction ([place]) 09/23/2012  . DM type 2 causing neurological disease ([place]) 08/26/2012  . Stroke ([place])   . Hypertension     Rexanne Mano, PT 09/19/2017, 8:18 AM  [facility] [address] [place], [place], [address] Phone: [phone]   Fax:   [phone]  Name: Skarleth Delmonico MRN: [id number] Date of Birth: 1950-10-17

## 2017-09-21 ENCOUNTER — Ambulatory Visit: Payer: Medicare Other | Attending: Physical Medicine & Rehabilitation | Admitting: Rehabilitation

## 2017-09-21 ENCOUNTER — Encounter: Payer: Self-pay | Admitting: Rehabilitation

## 2017-09-21 DIAGNOSIS — I69951 Hemiplegia and hemiparesis following unspecified cerebrovascular disease affecting right dominant side: Secondary | ICD-10-CM | POA: Diagnosis present

## 2017-09-21 DIAGNOSIS — R2689 Other abnormalities of gait and mobility: Secondary | ICD-10-CM | POA: Insufficient documentation

## 2017-09-21 DIAGNOSIS — M6281 Muscle weakness (generalized): Secondary | ICD-10-CM | POA: Insufficient documentation

## 2017-09-21 DIAGNOSIS — R2681 Unsteadiness on feet: Secondary | ICD-10-CM | POA: Insufficient documentation

## 2017-09-21 NOTE — Therapy (Signed)
[facility] [address] [place], [place], [address] Phone: [phone]   Fax:  [phone]  Physical Therapy Treatment  Patient Details  Name: Sophia Rodriguez MRN: [id number] Date of Birth: 1950/11/20 Referring Provider: Delice Lesch, MD   Encounter Date: 09/21/2017  PT End of Session - 09/21/17 0938    Visit Number  7    Number of Visits  13    Date for PT Re-Evaluation  10/10/17    Authorization Type  Medicare, BCBS secondary    Authorization Time Period  08/14/17 to 11/13/2017    PT Start Time  0933    PT Stop Time  1015    PT Time Calculation (min)  42 min    Activity Tolerance  Patient tolerated treatment well    Behavior During Therapy  [facility] for tasks assessed/performed       Past Medical History:  Diagnosis Date  . Arthritis   . Diabetes mellitus   . Hypertension   . Stroke [facility])     Past Surgical History:  Procedure Laterality Date  . HIP ARTHROPLASTY Right 11/24/2012   Procedure: RIGHT HIP HEMIARTHROPLASTY;  Surgeon: Augustin Schooling, MD;  Location: [facility];  Service: Orthopedics;  Laterality: Right;  hemiatroplasty, DePuy Triloc    There were no vitals filed for this visit.  Subjective Assessment - 09/21/17 0935    Subjective  Pt reports doing well, no chnages.      Pertinent History  -Rt hip hemi; CVA x3; HTN DM    Patient Stated Goals  I want to get rid of the brace. I want to increase my endurance. Return to pushing the cart at the grocery store.     Currently in Pain?  No/denies                       [facility] Adult PT Treatment/Exercise - 09/21/17 0956      Ambulation/Gait   Ambulation/Gait  Yes    Ambulation/Gait Assistance  5: Supervision    Ambulation/Gait Assistance Details  Continue to have pt ambulate pushing steady to simulate grocery cart x 700' while reaching down and up, side stepping to retrieve items.  Pt reports that aide normally gets items, therefore encouraged pt to  incorporate this into grocery shopping.  Cues for improved R step length, esp with fatigue and also for improved R lateral weight shift when needing to take several side steps to reach for object.  Cues for safe set up of "shopping cart" to maintain support while reaching.  Pt verbalized and return demo.      Ambulation Distance (Feet)  700 Feet    Assistive device  -- steady used as shopping cart    Gait Pattern  Step-through pattern;Decreased step length - right;Decreased step length - left;Decreased hip/knee flexion - right;Decreased dorsiflexion - right;Right foot flat;Decreased stance time - right;Decreased weight shift to right;Poor foot clearance - right    Ambulation Surface  Level;Indoor      Neuro Re-ed    Neuro Re-ed Details   Continue to work on improved R LE activation during both stance and swing phase of gait.  While at counter top marching in place with RUE support only.  Pt with good R hip and knee flexion, however has increased difficulty when in R stance.  Provided tactile faciliation for improved weight shift with light tapping to R hip for improved activation.  Pt did improve, therefore progressed to tapping cone with LLE  for improved stance control.  Pt requires min A along with RUE support as she is very fearful of loading RLE.  Again, she did demo mild improvement within session.  Advancing and retro stepping RLE over orange barrier (once over barrier, loading RLE with emphasis on forward protraction of hip).  Performed x 10 reps with facilitation for improved forward weight shift.  Again, pt very fearful but did improve with cuing.  Did note slight R toe catching at end of tasks due to fatigue.  Ended session with stairs for more automatic RLE activation.  Had pt negotiate with RUE only, leading with R LE when ascending, two rails and step to pattern to descend.  Performed stairs x 3 reps in this manner with pt unable to get RLE into full extension despite faciliation and cues.                PT Education - 09/21/17 [phone]    Education Details  Adding trying to retrieve items when grocery shopping, walking program in home to increase endurance.     Person(s) Educated  Patient    Methods  Explanation    Comprehension  Verbalized understanding       PT Short Term Goals - 09/15/17 0907      PT SHORT TERM GOAL #1   Title  Patient will be independent with HEP (Target for all STGs 09/12/2017)    Baseline  09/15/2017 Pt needs cues to perform correctly.    Time  4    Period  Weeks    Status  Partially Met      PT SHORT TERM GOAL #2   Title  Patient will ambulate 400 ft pushing simulated grocery cart vs RW to prepare for return to grocery shopping while pushing a cart (instead of riding an electric cart).     Baseline  09/15/2017 Met today.    Time  4    Period  Weeks    Status  Achieved      PT SHORT TERM GOAL #3   Title  Patient will improve gait velocity to >1.81 ft/sec (above threshold for high fall risk) with least restrictive combination of +/- AFO and cane vs RW.     Baseline  09/15/2017 No comparison for baseline, 1.74 ft/sec AFO w/ RW scored today.    Time  4    Period  Weeks    Status  Partially Met      PT SHORT TERM GOAL #4   Title  Patient will improve TUG to <25 sec with least restrictive combination of +/- AFO and cane vs RW.     Baseline  09/15/2017 27.4 sec w/ AFO and RW scored today, mild improvement but not to goal.    Time  4    Period  Weeks    Status  Partially Met        PT Long Term Goals - 08/15/17 0955      PT LONG TERM GOAL #1   Title  Independent in updated HEP  and able to verbalize plan for continued community or home-based exercise program upon d/c from PT. (Target for all LTGs 10/10/2017)    Time  8    Period  Weeks    Status  New    Target Date  10/10/17      PT LONG TERM GOAL #2   Title  Patient will improve gait velocity to >=1.95 ft/sec with least restrictive combination of +/- AFO and cance vs RW  to indicate lesser  fall risk.     Time  8    Period  Weeks    Status  New      PT LONG TERM GOAL #3   Title  Patient will ambulate 800 ft pushing simulated grocery cart (vs RW) modified independent to prepare for pushing grocery cart at store (instead of using electric cart).     Time  8    Period  Weeks    Status  New            Plan - 09/21/17 6378    Clinical Impression Statement  Skilled session continues to address safety and endurance with ambulation with simulated shopping cart.  Also continue to address NMR tasks for improved R LE activation in swing but esp stance phase of gait.  Pt very fearful of loading RLE.      Rehab Potential  Fair    Clinical Impairments Affecting Rehab Potential  decreased memory; prior h/o decr motivation for HEP    PT Frequency  2x / week 2x/wk x 4weeks, then 1x/wk x 4 weeks    PT Duration  4 weeks 2x/wk x 4weeks, then 1x/wk x 4 weeks    PT Treatment/Interventions  ADLs/Self Care Home Management;DME Instruction;Gait training;Cognitive remediation;Neuromuscular re-education;Balance training;Therapeutic exercise;Therapeutic activities;Functional mobility training;Stair training;Patient/family education;Orthotic Fit/Training;Manual techniques;Passive range of motion    PT Next Visit Plan  RLE strengthening and gait training (especially increased time/distance with goal to walk 800 ft); (likely will not need/use entire 8 weeks of therapy)    Consulted and Agree with Plan of Care  Patient       Patient will benefit from skilled therapeutic intervention in order to improve the following deficits and impairments:  Abnormal gait, Decreased activity tolerance, Decreased balance, Decreased cognition, Decreased mobility, Decreased endurance, Decreased coordination, Decreased range of motion, Decreased strength, Impaired UE functional use, Impaired tone  Visit Diagnosis: Other abnormalities of gait and mobility  Muscle weakness (generalized)  Unsteadiness on  feet  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 History of stroke 07/31/2017  . Muscle spasticity 10/04/2015  . 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 Right hemiparesis ([place]) 05/10/2014  . Abnormality of gait 05/10/2014  . Closed fracture of right hip ([place]) 11/24/2012  . 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 Stroke ([place])   . Hypertension     Cameron Sprang, PT, MPT [facility] [address] [place], [place], [address] Phone: [phone]   Fax:  [phone] 09/21/17, 6:11 PM  Name: Zilda No MRN: [id number] Date of Birth: [birth year]

## 2017-09-29 ENCOUNTER — Ambulatory Visit: Payer: Medicare Other | Admitting: Physical Therapy

## 2017-09-29 ENCOUNTER — Encounter: Payer: Self-pay | Admitting: Physical Therapy

## 2017-09-29 DIAGNOSIS — M6281 Muscle weakness (generalized): Secondary | ICD-10-CM

## 2017-09-29 DIAGNOSIS — R2689 Other abnormalities of gait and mobility: Secondary | ICD-10-CM

## 2017-09-29 NOTE — Therapy (Signed)
[place] [address] [place], [place], [address] Phone: [phone]   Fax:  [phone]  Physical Therapy Treatment  Patient Details  Name: Sophia Rodriguez MRN: [id number] Date of Birth: May 27, 1950 Referring Provider: Delice Lesch, MD   Encounter Date: 09/29/2017  PT End of Session - 09/29/17 1022    Visit Number  8    Number of Visits  13    Date for PT Re-Evaluation  10/10/17    Authorization Type  Medicare, BCBS secondary    Authorization Time Period  08/14/17 to 11/13/2017    PT Start Time  0758    PT Stop Time  0840    PT Time Calculation (min)  42 min    Equipment Utilized During Treatment  Gait belt    Activity Tolerance  Patient tolerated treatment well    Behavior During Therapy  [facility] for tasks assessed/performed       Past Medical History:  Diagnosis Date  . Arthritis   . Diabetes mellitus   . Hypertension   . Stroke [facility])     Past Surgical History:  Procedure Laterality Date  . HIP ARTHROPLASTY Right 11/24/2012   Procedure: RIGHT HIP HEMIARTHROPLASTY;  Surgeon: Augustin Schooling, MD;  Location: [facility];  Service: Orthopedics;  Laterality: Right;  hemiatroplasty, DePuy Triloc    There were no vitals filed for this visit.  Subjective Assessment - 09/29/17 0758    Subjective  Pt reports doing well, no chnages.  Hasn't been to grocery store yet to practice getting things off the shelves. Reports the fall she had in February was when reaching down into freezer at grocery store to retrieve item and fell backwards. Asking if she will ever go back to using her cane.     Pertinent History  -Rt hip hemi; CVA x3; HTN DM    Patient Stated Goals  I want to get rid of the brace. I want to increase my endurance. Return to pushing the cart at the grocery store.     Currently in Pain?  No/denies         [facility] PT Assessment - 09/29/17 0811      Timed Up and Go Test   Normal TUG (seconds)  29.03      * TUG  slower than previous measure; ? Due to limited walking when knee injured (knee now without pain)              [facility] Adult PT Treatment/Exercise - 09/29/17 0808      Transfers   Sit to Stand  6: Modified independent (Device/Increase time)      Ambulation/Gait   Ambulation/Gait Assistance  4: Min guard    Ambulation/Gait Assistance Details  minguard with SPC due to dragging rt foot >50% of steps; no LOB however at higher risk     Ambulation Distance (Feet)  500 Feet 50, 40,    Assistive device  Rolling walker;Straight cane    Gait Pattern  Step-through pattern;Decreased step length - right;Decreased hip/knee flexion - right;Decreased dorsiflexion - right;Right foot flat;Decreased stance time - right;Decreased weight shift to right;Poor foot clearance - right    Ambulation Surface  Level;Unlevel;Indoor;Outdoor;Paved    Pre-Gait Activities  prior to ambulation with SPC, RLE marching as "warm-up"; at counter, RLE step over cane on floor, weight shift fully onto RLE with hip an dknee extension and then wt-shift back on to LLE and bring RLE back x 10 reps;     Gait Comments  increased fatigue walking over sidewalk (pt works harder to clear rt foot as she cannot slide it as she does on tile with her leather shoe cap); 3 standing rest breaks over 500 ft outdoors      Knee/Hip Exercises: Machines for Strengthening   Cybex Leg Press  40# x 5 reps RLE only; 30# x 5 reps (pt struggling to complete 5 reps at 40# and noted she previously did 10 reps-pt denied knee pain limiting her)             PT Education - 09/29/17 1017    Education Details  results of today's TUG indicate remains a fall risk (although has not lost balance; moves slowly, controlled to avoid falling); improved gait velocity with RW compared to previously, but ultimately would like to see her reach 2.6 ft/sec for safe community ambulation; recommend continue to use RW and will trial in PT    Person(s) Educated  Patient     Methods  Explanation    Comprehension  Verbalized understanding       PT Short Term Goals - 09/15/17 0907      PT SHORT TERM GOAL #1   Title  Patient will be independent with HEP (Target for all STGs 09/12/2017)    Baseline  09/15/2017 Pt needs cues to perform correctly.    Time  4    Period  Weeks    Status  Partially Met      PT SHORT TERM GOAL #2   Title  Patient will ambulate 400 ft pushing simulated grocery cart vs RW to prepare for return to grocery shopping while pushing a cart (instead of riding an electric cart).     Baseline  09/15/2017 Met today.    Time  4    Period  Weeks    Status  Achieved      PT SHORT TERM GOAL #3   Title  Patient will improve gait velocity to >1.81 ft/sec (above threshold for high fall risk) with least restrictive combination of +/- AFO and cane vs RW.     Baseline  09/15/2017 No comparison for baseline, 1.74 ft/sec AFO w/ RW scored today.    Time  4    Period  Weeks    Status  Partially Met      PT SHORT TERM GOAL #4   Title  Patient will improve TUG to <25 sec with least restrictive combination of +/- AFO and cane vs RW.     Baseline  09/15/2017 27.4 sec w/ AFO and RW scored today, mild improvement but not to goal.    Time  4    Period  Weeks    Status  Partially Met        PT Long Term Goals - 08/15/17 0955      PT LONG TERM GOAL #1   Title  Independent in updated HEP  and able to verbalize plan for continued community or home-based exercise program upon d/c from PT. (Target for all LTGs 10/10/2017)    Time  8    Period  Weeks    Status  New    Target Date  10/10/17      PT LONG TERM GOAL #2   Title  Patient will improve gait velocity to >=1.95 ft/sec with least restrictive combination of +/- AFO and cance vs RW to indicate lesser fall risk.     Time  8    Period  Weeks    Status  New  PT LONG TERM GOAL #3   Title  Patient will ambulate 800 ft pushing simulated grocery cart (vs RW) modified independent to prepare for pushing  grocery cart at store (instead of using electric cart).     Time  8    Period  Weeks    Status  New            Plan - 09/29/17 1020    Clinical Impression Statement  Skilled session focused on pre-gait and gait training with emphasis on RLE clearance during swing and hip/knee extension in stance to pushoff for improved safety with ambulation. Patient wanted to try [facility] again (she had been using prior to fall in Feb 2019) and did not lose her balance with SPC, but did seem to decr her velocity. Discussed research on gait velocity and how this figures into the decision of whether she is safe to use cane again. Will continue to trial cane at next appt.     Rehab Potential  Fair    Clinical Impairments Affecting Rehab Potential  decreased memory; prior h/o decr motivation for HEP    PT Frequency  2x / week 2x/wk x 4weeks, then 1x/wk x 4 weeks    PT Duration  4 weeks 2x/wk x 4weeks, then 1x/wk x 4 weeks    PT Treatment/Interventions  ADLs/Self Care Home Management;DME Instruction;Gait training;Cognitive remediation;Neuromuscular re-education;Balance training;Therapeutic exercise;Therapeutic activities;Functional mobility training;Stair training;Patient/family education;Orthotic Fit/Training;Manual techniques;Passive range of motion    PT Next Visit Plan  RLE strengthening and gait training (especially increased time/distance with goal to walk 800 ft); trial using SPC (?quad rubber tip) and check gait velocity with cane to compare to her 1.82 f/s on 7/9 with RW;  **pt reports she will miss week of 7/22 due to out of town; appt made for following week will be within cert period and allowed based on one missed week of PT    Consulted and Agree with Plan of Care  Patient       Patient will benefit from skilled therapeutic intervention in order to improve the following deficits and impairments:  Abnormal gait, Decreased activity tolerance, Decreased balance, Decreased cognition, Decreased mobility,  Decreased endurance, Decreased coordination, Decreased range of motion, Decreased strength, Impaired UE functional use, Impaired tone  Visit Diagnosis: Other abnormalities of gait and mobility  Muscle weakness (generalized)     Problem List Patient Active Problem List   Diagnosis Date Noted  . History of stroke 07/31/2017  . Muscle spasticity 10/04/2015  . 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 Right hemiparesis ([place]) 05/10/2014  . Abnormality of gait 05/10/2014  . Closed fracture of right hip ([place]) 11/24/2012  . 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 Stroke ([place])   . Hypertension     Rexanne Mano, PT 09/29/2017, 10:28 AM  [facility] [address] [place], [place], [address] Phone: [phone]   Fax:  [phone]  Name: Sophia Rodriguez MRN: [id number] Date of Birth: 07-11-1950

## 2017-10-06 ENCOUNTER — Encounter: Payer: Self-pay | Admitting: Physical Therapy

## 2017-10-06 ENCOUNTER — Ambulatory Visit: Payer: Medicare Other | Admitting: Physical Therapy

## 2017-10-06 DIAGNOSIS — R2689 Other abnormalities of gait and mobility: Secondary | ICD-10-CM

## 2017-10-06 DIAGNOSIS — M6281 Muscle weakness (generalized): Secondary | ICD-10-CM

## 2017-10-06 NOTE — Therapy (Signed)
[facility] [address] [place], [place], [address] Phone: [phone]   Fax:  [phone]  Physical Therapy Treatment  Patient Details  Name: Sophia Rodriguez MRN: [id number] Date of Birth: 12-Nov-1950 Referring Provider: Delice Lesch, MD   Encounter Date: 10/06/2017  PT End of Session - 10/06/17 1251    Visit Number  9    Number of Visits  13    Date for PT Re-Evaluation  10/10/17    Authorization Type  Medicare, BCBS secondary    Authorization Time Period  08/14/17 to 11/13/2017    PT Start Time  0758    PT Stop Time  0841    PT Time Calculation (min)  43 min    Equipment Utilized During Treatment  Gait belt    Activity Tolerance  Patient tolerated treatment well    Behavior During Therapy  [facility] for tasks assessed/performed       Past Medical History:  Diagnosis Date  . Arthritis   . Diabetes mellitus   . Hypertension   . Stroke [facility])     Past Surgical History:  Procedure Laterality Date  . HIP ARTHROPLASTY Right 11/24/2012   Procedure: RIGHT HIP HEMIARTHROPLASTY;  Surgeon: Augustin Schooling, MD;  Location: [facility];  Service: Orthopedics;  Laterality: Right;  hemiatroplasty, DePuy Triloc    There were no vitals filed for this visit.  Subjective Assessment - 10/06/17 0757    Subjective  No changes. Will go to grocery store tomorrow and will practice reaching for things off the overhead and lower shelves.     Pertinent History  -Rt hip hemi; CVA x3; HTN DM    Patient Stated Goals  I want to get rid of the brace. I want to increase my endurance. Return to pushing the cart at the grocery store.     Currently in Pain?  No/denies                       [facility] Adult PT Treatment/Exercise - 10/06/17 0833      Transfers   Sit to Stand  6: Modified independent (Device/Increase time)      Ambulation/Gait   Ambulation/Gait Assistance  4: Min guard    Ambulation/Gait Assistance Details  for safety due to  using cane    Ambulation Distance (Feet)  700 Feet 100    Assistive device  Straight cane with quad rubber tip    Gait Pattern  Step-through pattern;Decreased step length - right;Decreased hip/knee flexion - right;Decreased dorsiflexion - right;Right foot flat;Poor foot clearance - right;Trendelenburg    Ambulation Surface  Level;Indoor;Outdoor;Paved    Gait Comments  worked on lessening LEFT trendelenberg and improving rt heel strike to improve RLE clearance      Knee/Hip Exercises: Standing   Other Standing Knee Exercises  stand on LLE and tap rt foot up onto 6 inch step x 15 reps (focus on left hip not dropping)       Knee/Hip Exercises: Supine   Other Supine Knee/Hip Exercises  bridge with blue band at knees with hip abduction, 5 sec hold x 10      Knee/Hip Exercises: Sidelying   Other Sidelying Knee/Hip Exercises  leg lift with circles x 10 each direction; leg lift and then pushing leg back into extension x 10; bil                PT Short Term Goals - 09/15/17 7290      PT  SHORT TERM GOAL #1   Title  Patient will be independent with HEP (Target for all STGs 09/12/2017)    Baseline  09/15/2017 Pt needs cues to perform correctly.    Time  4    Period  Weeks    Status  Partially Met      PT SHORT TERM GOAL #2   Title  Patient will ambulate 400 ft pushing simulated grocery cart vs RW to prepare for return to grocery shopping while pushing a cart (instead of riding an electric cart).     Baseline  09/15/2017 Met today.    Time  4    Period  Weeks    Status  Achieved      PT SHORT TERM GOAL #3   Title  Patient will improve gait velocity to >1.81 ft/sec (above threshold for high fall risk) with least restrictive combination of +/- AFO and cane vs RW.     Baseline  09/15/2017 No comparison for baseline, 1.74 ft/sec AFO w/ RW scored today.    Time  4    Period  Weeks    Status  Partially Met      PT SHORT TERM GOAL #4   Title  Patient will improve TUG to <25 sec with least  restrictive combination of +/- AFO and cane vs RW.     Baseline  09/15/2017 27.4 sec w/ AFO and RW scored today, mild improvement but not to goal.    Time  4    Period  Weeks    Status  Partially Met        PT Long Term Goals - 08/15/17 0955      PT LONG TERM GOAL #1   Title  Independent in updated HEP  and able to verbalize plan for continued community or home-based exercise program upon d/c from PT. (Target for all LTGs 10/10/2017)    Time  8    Period  Weeks    Status  New    Target Date  10/10/17      PT LONG TERM GOAL #2   Title  Patient will improve gait velocity to >=1.95 ft/sec with least restrictive combination of +/- AFO and cance vs RW to indicate lesser fall risk.     Time  8    Period  Weeks    Status  New      PT LONG TERM GOAL #3   Title  Patient will ambulate 800 ft pushing simulated grocery cart (vs RW) modified independent to prepare for pushing grocery cart at store (instead of using electric cart).     Time  8    Period  Weeks    Status  New            Plan - 10/06/17 1251    Clinical Impression Statement  Session focused on gait training with SPC with rubber quad tip. Patient clearing Rt foot as well as she does with the RW. she recognized when she was fatiguing  and would take short standing rest breaks to minimize risk of tripping/LOB due to rt foot dragging. Focused on bil hip abduction strengthening as pt observed to have slight drop of rt pelvis when in stance on LLE (also contributing to poor RLE clearance in swing). Patient continues to improve. Will see when she returns from vacation for final visit.     Rehab Potential  Fair    Clinical Impairments Affecting Rehab Potential  decreased memory; prior h/o decr motivation for HEP  PT Frequency  2x / week 2x/wk x 4weeks, then 1x/wk x 4 weeks    PT Duration  4 weeks 2x/wk x 4weeks, then 1x/wk x 4 weeks    PT Treatment/Interventions  ADLs/Self Care Home Management;DME Instruction;Gait  training;Cognitive remediation;Neuromuscular re-education;Balance training;Therapeutic exercise;Therapeutic activities;Functional mobility training;Stair training;Patient/family education;Orthotic Fit/Training;Manual techniques;Passive range of motion    PT Next Visit Plan  check LTGs and d/c    Consulted and Agree with Plan of Care  Patient       Patient will benefit from skilled therapeutic intervention in order to improve the following deficits and impairments:  Abnormal gait, Decreased activity tolerance, Decreased balance, Decreased cognition, Decreased mobility, Decreased endurance, Decreased coordination, Decreased range of motion, Decreased strength, Impaired UE functional use, Impaired tone  Visit Diagnosis: Other abnormalities of gait and mobility  Muscle weakness (generalized)     Problem List Patient Active Problem List   Diagnosis Date Noted  . History of stroke 07/31/2017  . Muscle spasticity 10/04/2015  . 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 Right hemiparesis ([place]) 05/10/2014  . Abnormality of gait 05/10/2014  . Closed fracture of right hip ([place]) 11/24/2012  . 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 Stroke ([place])   . Hypertension     Rexanne Mano, PT 10/06/2017, 1:00 PM  [facility] [address] [place], [place], [address] Phone: [phone]   Fax:  [phone]  Name: Sophia Rodriguez MRN: [id number] Date of Birth: 13-Dec-1950

## 2017-10-13 ENCOUNTER — Ambulatory Visit: Payer: Medicare Other | Admitting: Physical Therapy

## 2017-10-22 ENCOUNTER — Encounter: Payer: Medicare Other | Attending: Physical Medicine & Rehabilitation | Admitting: Physical Medicine & Rehabilitation

## 2017-10-22 ENCOUNTER — Encounter: Payer: Self-pay | Admitting: Physical Medicine & Rehabilitation

## 2017-10-22 VITALS — BP 113/77 | HR 84 | Resp 14 | Ht 65.0 in | Wt 171.0 lb

## 2017-10-22 DIAGNOSIS — I10 Essential (primary) hypertension: Secondary | ICD-10-CM | POA: Diagnosis not present

## 2017-10-22 DIAGNOSIS — I69898 Other sequelae of other cerebrovascular disease: Secondary | ICD-10-CM | POA: Insufficient documentation

## 2017-10-22 DIAGNOSIS — G8311 Monoplegia of lower limb affecting right dominant side: Secondary | ICD-10-CM | POA: Diagnosis not present

## 2017-10-22 DIAGNOSIS — G8310 Monoplegia of lower limb affecting unspecified side: Secondary | ICD-10-CM | POA: Diagnosis not present

## 2017-10-22 DIAGNOSIS — Z8673 Personal history of transient ischemic attack (TIA), and cerebral infarction without residual deficits: Secondary | ICD-10-CM | POA: Diagnosis not present

## 2017-10-22 DIAGNOSIS — R269 Unspecified abnormalities of gait and mobility: Secondary | ICD-10-CM | POA: Insufficient documentation

## 2017-10-22 DIAGNOSIS — E119 Type 2 diabetes mellitus without complications: Secondary | ICD-10-CM | POA: Diagnosis not present

## 2017-10-22 NOTE — Progress Notes (Signed)
Subjective:    Patient ID: Sophia Rodriguez, female    DOB: December 07, 1950, 67 y.o.   MRN: [id number]  HPI  67 y/o female with pmh of strokes x3 (most recently ~2014), DM, HTN presents for follow up of spasticity.   Initially stated: The problem is mainly in her right leg.  It causes her difficulty with ambulation.  She uses a rolling walker currently and has not had any falls.  The problems started ~ 5 years ago getting worse over time. The biggest problems seems to be dragging of her foot.  It gets worse at the end of the day.  She has some associated weakness, but denies numbness/tingling.  Previously she was prescribed Baclofen 5, which did not work and 10, which caused her too much drowsiness.  She had Botox, but this was not effective, tizanidine made her too drowsy.   She was last seen in clinic on 04/24/17.  At that time, pt had dysport injection.  She states she feels she still needs to go up on the dose. She notes a loss of balance when trying to get milk at the grocery store.  No AFO today.   Pain Inventory Average Pain 3 Pain Right Now 0 My pain is no selection  In the last 24 hours, has pain interfered with the following? General activity 3 Relation with others 3 Enjoyment of life 3 What TIME of day is your pain at its worst? evening, night Sleep (in general) Fair  Pain is worse with: some activites Pain improves with: rest, therapy/exercise and medication Relief from Meds: 5  Mobility walk with assistance use a cane use a walker do you drive?  no use a wheelchair Do you have any goals in this area?  yes  Function Do you have any goals in this area?  no  Neuro/Psych trouble walking confusion depression anxiety  Prior Studies Any changes since last visit?  no  Physicians involved in your care Any changes since last visit?  no   Family History  Problem Relation Age of Onset  . Stroke Father   . Stroke Brother    Social History   Socioeconomic History  .  Marital status: Single    Spouse name: Not on file  . Number of children: 2  . Years of education: Grad school  . Highest education level: Not on file  Occupational History  . Not on file  Social Needs  . Financial resource strain: Not on file  . Food insecurity:    Worry: Not on file    Inability: Not on file  . Transportation needs:    Medical: Not on file    Non-medical: Not on file  Tobacco Use  . Smoking status: Former Smoker    Last attempt to quit: 05/28/2007    Years since quitting: 10.4  . Smokeless tobacco: Never Used  Substance and Sexual Activity  . Alcohol use: No  . Drug use: No  . Sexual activity: Not on file  Lifestyle  . Physical activity:    Days per week: Not on file    Minutes per session: Not on file  . Stress: Not on file  Relationships  . Social connections:    Talks on phone: Not on file    Gets together: Not on file    Attends religious service: Not on file    Active member of club or organization: Not on file    Attends meetings of clubs or organizations: Not on file  Relationship status: Not on file  Other Topics Concern  . Not on file  Social History Narrative   Lives at home by herself.    Daughter come to help at home and she has a cleaning lady to help twice a week.    Caffeine: Every morning 1-1.5 cups coffee/day, and 12oz tea/day    Right-handed   Past Surgical History:  Procedure Laterality Date  . HIP ARTHROPLASTY Right 11/24/2012   Procedure: RIGHT HIP HEMIARTHROPLASTY;  Surgeon: Verlee RossettiSteven R Norris, MD;  Location: [facility];  Service: Orthopedics;  Laterality: Right;  hemiatroplasty, DePuy Triloc   Past Medical History:  Diagnosis Date  . Arthritis   . Diabetes mellitus   . Hypertension   . Stroke (HCC)    BP 113/77 (BP Location: Left Arm, Patient Position: Sitting, Cuff Size: Normal)   Pulse 84   Resp 14   Ht 5\' 5"  (1.651 m)   Wt 171 lb (77.6 kg)   SpO2 97%   BMI 28.46 kg/m   Opioid Risk Score:   Fall Risk Score:   `1  Depression screen PHQ 2/9  [facility] 12/03/2016 06/12/2016 01/11/2016 11/01/2015 07/13/2015 06/15/2015  Decreased Interest 1 1 0 0 1 1  Down, Depressed, Hopeless 1 1 0 0 1 1  PHQ - 2 Score 2 2 0 0 2 2  Altered sleeping - - - - - 3  Tired, decreased energy - - - - - 2  Change in appetite - - - - - 0  Feeling bad or failure about yourself  - - - - - 1  Trouble concentrating - - - - - 3  Moving slowly or fidgety/restless - - - - - 2  Suicidal thoughts - - - - - 0  PHQ-9 Score - - - - - 13  Difficult doing work/chores - - - - - Somewhat difficult   Review of Systems  Musculoskeletal: +gait problem.  Neurological: Positive for weakness.  Psychiatric/Behavioral: Positive for dysphoric mood, anxiety disorder, confusion, memory loss. All other systems reviewed and are negative.     Objective:   Physical Exam HENT: Normocephalic, Atraumatic Eyes: EOMI. No discharge.  Cardio: RRR. No JVD. Pulm: B/l clear to auscultation.  Effort normal. Abd: Soft, BS+ MSK:  Gait improved, did not bring AFO today.   No TTP.    No edema. Neuro:   Sensation intact to light touch in all LE dermatomes  Strength  4+/5 in RLE myotomes (unchanged)  MAS: Right plantar flexors: trace tone Skin: Warm and Dry. Intact.     Assessment & Plan:  67 y/o female with pmh of strokes x3 (most recently ~2014), DM, HTN presents for follow up of spasticity.   1. RLE spasticity s/p stroke  Side effects with Tizanidine, balcofen  Encouraged muscle stretches.    Completed PT, cont HEP  Encouraged presence of daughter.  Cont AFO  Cont dose, plan for    250U Dysport to right lateral gastroc   250U Dysport to right medial gastroc  Will refer to PT for gait training due to recent fall.   2. Abnormality of gait  Cont cane for safety   Referral to PT

## 2017-10-22 NOTE — Progress Notes (Signed)
Dysport: Procedure Note Patient Name: Sophia Rodriguez DOB: [birth year] MRN: [id number]  10/22/17   Procedure: Botulinum toxin administration Guidance: EMG Diagnosis: Spastic monoplegia Attending: Maryla MorrowAnkit Waunita Sandstrom, MD    Informed consent: Risks, benefits & options of the procedure are explained to the patient (and/or family). The patient elects to proceed with procedure. Risks include but are not limited to weakness, respiratory distress, dry mouth, ptosis, antibody formation, worsening of some areas of function. Benefits include decreased abnormal muscle tone, improved hygiene and positioning, decreased skin breakdown and, in some cases, decreased pain. Options include conservative management with oral antispasticity agents, phenol chemodenervation of nerve or at motor nerve branches. More invasive options include intrathecal balcofen adminstration for appropriate candidates. Surgical options may include tendon lengthening or transposition or, rarely, dorsal rhizotomy.   History/Physical Examination: 67 y.o. female with pmh of strokes x3 (most recently ~2014), DM, HTN presents for follow up of spasticity.  mAS: RLE ankle plantarflexors 1+/4, more pronounced with ambulation. Previous Treatments: Therapy/Range of motion/Bracing/Medications Indication for guidance: Target active muscules  Procedure: Botulinum toxin was mixed with preservative free saline with a dilution of 0.5cc to 100 units. Targeted limb and muscles were identified. The skin was prepped with alcohol swabs and placement of needle tip in targeted muscle was confirmed using appropriate guidance. Prior to injection, positioning of needle tip outside of blood vessel was determined by pulling back on syringe plunger.  MUSCLE UNITS Right medial gastoc 250U Right lateral gastroc 250U  Total units used: 500 Total units wasted: 0 Complications: None  Plan: Cont rolling walker for safety Burdell Peed Karis JubaAnil Garris Melhorn 9:46 AM

## 2017-10-23 ENCOUNTER — Ambulatory Visit: Payer: Medicare Other | Attending: Physical Medicine & Rehabilitation | Admitting: Physical Therapy

## 2017-10-23 ENCOUNTER — Encounter: Payer: Self-pay | Admitting: Physical Therapy

## 2017-10-23 DIAGNOSIS — R2681 Unsteadiness on feet: Secondary | ICD-10-CM | POA: Insufficient documentation

## 2017-10-23 DIAGNOSIS — R2689 Other abnormalities of gait and mobility: Secondary | ICD-10-CM

## 2017-10-23 NOTE — Therapy (Signed)
[facility] [address] [place], [place], [address] Phone: [phone]   Fax:  [phone]  Physical Therapy Treatment and Discharge Summary  Patient Details  Name: Sophia Rodriguez MRN: [id number] Date of Birth: May 14, 1950 Referring Provider: Delice Lesch, MD   Encounter Date: 10/23/2017  PT End of Session - 10/23/17 1013    Visit Number  10    Number of Visits  13    Date for PT Re-Evaluation  10/10/17    Authorization Type  Medicare, BCBS secondary    Authorization Time Period  08/14/17 to 11/13/2017    PT Start Time  0920    PT Stop Time  1007    PT Time Calculation (min)  47 min    Equipment Utilized During Treatment  Gait belt    Activity Tolerance  Patient tolerated treatment well    Behavior During Therapy  [facility] for tasks assessed/performed       Past Medical History:  Diagnosis Date  . Arthritis   . Diabetes mellitus   . Hypertension   . Stroke [facility])     Past Surgical History:  Procedure Laterality Date  . HIP ARTHROPLASTY Right 11/24/2012   Procedure: RIGHT HIP HEMIARTHROPLASTY;  Surgeon: Augustin Schooling, MD;  [facility];  Service: Orthopedics;  Laterality: Right;  hemiatroplasty, DePuy Triloc    There were no vitals filed for this visit.  Subjective Assessment - 10/23/17 0922    Subjective  No changes. Did well on her trip to [place] and [place]. did do her grocery shopping: pushed the cart until it go too heavy and then used electric cart (but then stood from cart to reach items)    Pertinent History  -Rt hip hemi; CVA x3; HTN DM    Patient Stated Goals  I want to get rid of the brace. I want to increase my endurance. Return to pushing the cart at the grocery store.     Currently in Pain?  No/denies                       [facility] Adult PT Treatment/Exercise - 10/23/17 0001      Transfers   Sit to Stand  6: Modified independent (Device/Increase time)    Comments  cne or  RW      Ambulation/Gait   Ambulation/Gait Assistance  6: Modified independent (Device/Increase time);4: Min guard    Ambulation/Gait Assistance Details  minguard for cane; no imbalance episodes; +sliding rt ball of foot across floor (has leather toe cap so shoe does not grab); with cues or change in surface (carpet, threshold) she is able to lift and clear her foot    Ambulation Distance (Feet)  800 Feet plus additiona short distances for assessment    Assistive device  Rolling walker;Straight cane;Other (Comment) push Stedy for simulated grocery cart    Gait Pattern  Step-through pattern;Decreased step length - right;Decreased hip/knee flexion - right;Decreased dorsiflexion - right;Right foot flat;Poor foot clearance - right;Trendelenburg    Ambulation Surface  Indoor    Gait Comments  simulated grocery cart with 4 swivel wheels and pt with difficulty making it go straight (pushes it at an angle to the path of her walking) states she does not have this problem at the grocery store      Knee/Hip Exercises: Aerobic   Nustep  L4 all 4 extremities x 2 min; L6 legs only x 2 minutes; pt educated and able to manipulate  seat for proper setting, how to adjust workload             PT Education - 10/23/17 1011    Education Details  results of gait velocity cane vs RW (cane is just above minimum threshold for safe use); Pt agreed to plan to use cane in her apartment with her aide initially, then progressing modified independent and then work on cane use in community with her aide or daughter    Person(s) Educated  Patient    Methods  Explanation    Comprehension  Verbalized understanding       PT Short Term Goals - 09/15/17 0907      PT SHORT TERM GOAL #1   Title  Patient will be independent with HEP (Target for all STGs 09/12/2017)    Baseline  09/15/2017 Pt needs cues to perform correctly.    Time  4    Period  Weeks    Status  Partially Met      PT SHORT TERM GOAL #2   Title  Patient  will ambulate 400 ft pushing simulated grocery cart vs RW to prepare for return to grocery shopping while pushing a cart (instead of riding an electric cart).     Baseline  09/15/2017 Met today.    Time  4    Period  Weeks    Status  Achieved      PT SHORT TERM GOAL #3   Title  Patient will improve gait velocity to >1.81 ft/sec (above threshold for high fall risk) with least restrictive combination of +/- AFO and cane vs RW.     Baseline  09/15/2017 No comparison for baseline, 1.74 ft/sec AFO w/ RW scored today.    Time  4    Period  Weeks    Status  Partially Met      PT SHORT TERM GOAL #4   Title  Patient will improve TUG to <25 sec with least restrictive combination of +/- AFO and cane vs RW.     Baseline  09/15/2017 27.4 sec w/ AFO and RW scored today, mild improvement but not to goal.    Time  4    Period  Weeks    Status  Partially Met        PT Long Term Goals - 10/23/17 1014      PT LONG TERM GOAL #1   Title  Independent in updated HEP  and able to verbalize plan for continued community or home-based exercise program upon d/c from PT. (Target for all LTGs 10/10/2017)    Baseline  8/2 plans to begin going to [facility] (using [facility]) and continue HEP    Time  8    Period  Weeks    Status  Achieved      PT LONG TERM GOAL #2   Title  Patient will improve gait velocity to >=1.95 ft/sec with least restrictive combination of +/- AFO and cance vs RW to indicate lesser fall risk.     Baseline  8/2 with AFO, RW 2.05 ft/sec    Time  8    Period  Weeks    Status  Achieved      PT LONG TERM GOAL #3   Title  Patient will ambulate 800 ft pushing simulated grocery cart (vs RW) modified independent to prepare for pushing grocery cart at store (instead of using electric cart).     Time  8    Period  Weeks  Status  Achieved            Plan - 10/23/17 1015    Clinical Impression Statement  Patient returns after her vacation out [place] and had no problems with her balance  or RW! LTGs assessed and pt met 3 of 3 goals. She has a plan for continued exercise and for advancing her confidence with her cane. All questions answered and pt discharged from PT    Rehab Potential  Fair    Clinical Impairments Affecting Rehab Potential  decreased memory; prior h/o decr motivation for HEP    PT Frequency  2x / week 2x/wk x 4weeks, then 1x/wk x 4 weeks    PT Duration  4 weeks 2x/wk x 4weeks, then 1x/wk x 4 weeks    PT Treatment/Interventions  ADLs/Self Care Home Management;DME Instruction;Gait training;Cognitive remediation;Neuromuscular re-education;Balance training;Therapeutic exercise;Therapeutic activities;Functional mobility training;Stair training;Patient/family education;Orthotic Fit/Training;Manual techniques;Passive range of motion    PT Next Visit Plan  --    Consulted and Agree with Plan of Care  Patient       Patient will benefit from skilled therapeutic intervention in order to improve the following deficits and impairments:  Abnormal gait, Decreased activity tolerance, Decreased balance, Decreased cognition, Decreased mobility, Decreased endurance, Decreased coordination, Decreased range of motion, Decreased strength, Impaired UE functional use, Impaired tone  Visit Diagnosis: Other abnormalities of gait and mobility  Unsteadiness on feet     Problem List Patient Active Problem List   Diagnosis Date Noted  . History of stroke 07/31/2017  . Muscle spasticity 10/04/2015  . 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 Right hemiparesis ([place]) 05/10/2014  . Abnormality of gait 05/10/2014  . Closed fracture of right hip ([place]) 11/24/2012  . 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 Stroke ([place])   . Hypertension    PHYSICAL THERAPY DISCHARGE SUMMARY  Visits from Start of Care: 10  Current functional level related to goals / functional outcomes: See goals above    Remaining deficits: RLE weakness and spasticity resulting in gait deviations and need for AFO and assistive device   Education / Equipment: HEP; safe use of RW; safe use of SPC with quad rubber tip  Plan: Patient agrees to discharge.  Patient goals were met. Patient is being discharged due to meeting the stated rehab goals.  ?????        Rexanne Mano, PT 10/23/2017, 1:11 PM  [facility] [address] [place], [place], [address] Phone: [phone]   Fax:  [phone]  Name: Sophia Rodriguez MRN: [id number] Date of Birth: 08/16/1950

## 2018-01-22 ENCOUNTER — Encounter: Payer: Self-pay | Admitting: Physical Medicine & Rehabilitation

## 2018-01-22 ENCOUNTER — Encounter: Payer: Medicare Other | Attending: Physical Medicine & Rehabilitation | Admitting: Physical Medicine & Rehabilitation

## 2018-01-22 VITALS — BP 111/75 | HR 82 | Ht 65.0 in | Wt 171.0 lb

## 2018-01-22 DIAGNOSIS — G8310 Monoplegia of lower limb affecting unspecified side: Secondary | ICD-10-CM

## 2018-01-22 DIAGNOSIS — G801 Spastic diplegic cerebral palsy: Secondary | ICD-10-CM | POA: Diagnosis not present

## 2018-01-22 DIAGNOSIS — I10 Essential (primary) hypertension: Secondary | ICD-10-CM | POA: Diagnosis not present

## 2018-01-22 DIAGNOSIS — R269 Unspecified abnormalities of gait and mobility: Secondary | ICD-10-CM | POA: Insufficient documentation

## 2018-01-22 DIAGNOSIS — Z8673 Personal history of transient ischemic attack (TIA), and cerebral infarction without residual deficits: Secondary | ICD-10-CM | POA: Insufficient documentation

## 2018-01-22 DIAGNOSIS — I69898 Other sequelae of other cerebrovascular disease: Secondary | ICD-10-CM | POA: Diagnosis not present

## 2018-01-22 DIAGNOSIS — E119 Type 2 diabetes mellitus without complications: Secondary | ICD-10-CM | POA: Insufficient documentation

## 2018-01-22 NOTE — Progress Notes (Signed)
Dysport: Procedure Note Patient Name: Verta Riedlinger DOB: December 22, 1950 MRN: [id number]  01/22/18   Procedure: Botulinum toxin administration Guidance: EMG Diagnosis: Spastic monoplegia Attending: Maryla Morrow, MD    Informed consent: Risks, benefits & options of the procedure are explained to the patient (and/or family). The patient elects to proceed with procedure. Risks include but are not limited to weakness, respiratory distress, dry mouth, ptosis, antibody formation, worsening of some areas of function. Benefits include decreased abnormal muscle tone, improved hygiene and positioning, decreased skin breakdown and, in some cases, decreased pain. Options include conservative management with oral antispasticity agents, phenol chemodenervation of nerve or at motor nerve branches. More invasive options include intrathecal balcofen adminstration for appropriate candidates. Surgical options may include tendon lengthening or transposition or, rarely, dorsal rhizotomy.   History/Physical Examination: 67 y.o. female with pmh of strokes x3 (most recently ~2014), DM, HTN presents for follow up of spasticity.  mAS: RLE ankle plantarflexors 1/4, more pronounced with ambulation. Previous Treatments: Therapy/Range of motion/Bracing/Medications Indication for guidance: Target active muscules  Procedure: Botulinum toxin was mixed with preservative free saline with a dilution of 0.5cc to 100 units. Targeted limb and muscles were identified. The skin was prepped with alcohol swabs and placement of needle tip in targeted muscle was confirmed using appropriate guidance. Prior to injection, positioning of needle tip outside of blood vessel was determined by pulling back on syringe plunger.  MUSCLE UNITS Right medial gastoc 250U Right lateral gastroc 250U  Total units used: 500 Total units wasted: 0 Complications: None  Plan: Cont rolling walker for safety Khalin Royce Karis Juba 9:32 AM

## 2018-01-27 ENCOUNTER — Telehealth: Payer: Self-pay | Admitting: Nurse Practitioner

## 2018-01-27 NOTE — Telephone Encounter (Addendum)
Spoke with patient who stated yesterday she was in the kitchen cooking breakfast and got dizzy and sweaty. She sat down because she couldn't stand any longer. She called a neighbor to assist her off the floor. Her hip was sore today, but she took motrin which took the took pain away. She stated the same thing happened last week while preparing lunch, lasted 5-10 minutes. She didn't have to sit down, but waited at the counter until it went away. Then she was able to walk to her living room and sit down.  Inquired if she had any pain or other symptoms. She denied pain and any other symptoms. This RN asked if she was able to take her BP. She does not have a BP cuff.  This RN asked about her diabetes, and she stated she no longer has to take medicine for it. This RN advised she call PCP to discuss; this may be related to low BP or low blood sugars.  She verbalized understanding, appreciation and agreement.

## 2018-01-27 NOTE — Telephone Encounter (Signed)
Patient calling regarding a fall she had yesterday and would like a call back to discuss.

## 2018-04-28 ENCOUNTER — Encounter: Payer: Self-pay | Admitting: Physical Medicine & Rehabilitation

## 2018-04-28 ENCOUNTER — Encounter: Payer: Medicare Other | Attending: Physical Medicine & Rehabilitation | Admitting: Physical Medicine & Rehabilitation

## 2018-04-28 VITALS — BP 110/76 | HR 93 | Ht 65.0 in | Wt 165.0 lb

## 2018-04-28 DIAGNOSIS — G8310 Monoplegia of lower limb affecting unspecified side: Secondary | ICD-10-CM | POA: Insufficient documentation

## 2018-04-28 NOTE — Progress Notes (Signed)
Dysport: Procedure Note Patient Name: Zawadi Bailin DOB: 1951/02/29 MRN: [id number]  04/28/2018   Procedure: Botulinum toxin administration Guidance: EMG Diagnosis: Spastic monoplegia Attending: Maryla Morrow, MD    Informed consent: Risks, benefits & options of the procedure are explained to the patient (and/or family). The patient elects to proceed with procedure. Risks include but are not limited to weakness, respiratory distress, dry mouth, ptosis, antibody formation, worsening of some areas of function. Benefits include decreased abnormal muscle tone, improved hygiene and positioning, decreased skin breakdown and, in some cases, decreased pain. Options include conservative management with oral antispasticity agents, phenol chemodenervation of nerve or at motor nerve branches. More invasive options include intrathecal balcofen adminstration for appropriate candidates. Surgical options may include tendon lengthening or transposition or, rarely, dorsal rhizotomy.   History/Physical Examination: 68 y.o. female with pmh of strokes x3 (most recently ~2014), DM, HTN presents for follow up of spasticity.  Good benefit with injections, particularly with ambulation.  mAS: RLE ankle plantarflexors 1/4, more pronounced with ambulation. Previous Treatments: Therapy/Range of motion/Bracing/Medications Indication for guidance: Target active muscules  Procedure: Botulinum toxin was mixed with preservative free saline with a dilution of 0.5cc to 100 units. Targeted limb and muscles were identified. The skin was prepped with alcohol swabs and placement of needle tip in targeted muscle was confirmed using appropriate guidance. Prior to injection, positioning of needle tip outside of blood vessel was determined by pulling back on syringe plunger.  MUSCLE UNITS Right medial gastoc 250U Right lateral gastroc 250U  Total units used: 500 Total units wasted: 0 Complications: None  Plan: Cont rolling walker  for safety Ankit Karis Juba 9:06 AM

## 2018-07-28 ENCOUNTER — Ambulatory Visit: Payer: Medicare Other | Admitting: Physical Medicine & Rehabilitation

## 2018-08-02 ENCOUNTER — Telehealth: Payer: Self-pay | Admitting: *Deleted

## 2018-08-02 NOTE — Telephone Encounter (Signed)
LMVM for pt that due to schedule change appt needs to be rescheduled.  Will cancel appt and look for her to call back to r/s.

## 2018-08-03 ENCOUNTER — Ambulatory Visit: Payer: Medicare Other | Admitting: Nurse Practitioner

## 2018-08-03 ENCOUNTER — Ambulatory Visit: Payer: Medicare Other | Admitting: Family Medicine

## 2018-08-11 ENCOUNTER — Telehealth: Payer: Self-pay

## 2018-08-11 ENCOUNTER — Ambulatory Visit: Payer: Self-pay | Admitting: Family Medicine

## 2018-08-11 ENCOUNTER — Other Ambulatory Visit: Payer: Self-pay

## 2018-08-11 NOTE — Telephone Encounter (Signed)
Patient came into the office by mistake for her appt with Amy. She has given verbal consent to do a doxy.me visit and to file her insurance. Mobile number and carrier have been confirmed and sent.   Text: [phone] ([employer])

## 2018-08-12 ENCOUNTER — Encounter: Payer: Medicare Other | Attending: Physical Medicine & Rehabilitation | Admitting: Physical Medicine & Rehabilitation

## 2018-08-12 ENCOUNTER — Other Ambulatory Visit: Payer: Self-pay

## 2018-08-12 ENCOUNTER — Ambulatory Visit (INDEPENDENT_AMBULATORY_CARE_PROVIDER_SITE_OTHER): Payer: Medicare Other | Admitting: Family Medicine

## 2018-08-12 ENCOUNTER — Encounter: Payer: Self-pay | Admitting: Family Medicine

## 2018-08-12 ENCOUNTER — Telehealth: Payer: Self-pay | Admitting: Physical Medicine & Rehabilitation

## 2018-08-12 ENCOUNTER — Telehealth: Payer: Self-pay | Admitting: Family Medicine

## 2018-08-12 ENCOUNTER — Encounter: Payer: Self-pay | Admitting: Physical Medicine & Rehabilitation

## 2018-08-12 VITALS — BP 109/76 | HR 88 | Temp 98.6°F | Ht 65.0 in | Wt 164.0 lb

## 2018-08-12 DIAGNOSIS — M62838 Other muscle spasm: Secondary | ICD-10-CM | POA: Insufficient documentation

## 2018-08-12 DIAGNOSIS — G8310 Monoplegia of lower limb affecting unspecified side: Secondary | ICD-10-CM | POA: Insufficient documentation

## 2018-08-12 DIAGNOSIS — I6930 Unspecified sequelae of cerebral infarction: Secondary | ICD-10-CM | POA: Insufficient documentation

## 2018-08-12 DIAGNOSIS — M792 Neuralgia and neuritis, unspecified: Secondary | ICD-10-CM

## 2018-08-12 DIAGNOSIS — R269 Unspecified abnormalities of gait and mobility: Secondary | ICD-10-CM | POA: Insufficient documentation

## 2018-08-12 DIAGNOSIS — G8191 Hemiplegia, unspecified affecting right dominant side: Secondary | ICD-10-CM | POA: Diagnosis not present

## 2018-08-12 DIAGNOSIS — Z8673 Personal history of transient ischemic attack (TIA), and cerebral infarction without residual deficits: Secondary | ICD-10-CM

## 2018-08-12 MED ORDER — GABAPENTIN 300 MG PO CAPS: 300.0000 mg | ORAL_CAPSULE | Freq: Three times a day (TID) | ORAL | 11 refills | Status: DC

## 2018-08-12 NOTE — Progress Notes (Signed)
Subjective:    Patient ID: Sophia Rodriguez, female    DOB: 06-29-1950, 68 y.o.   MRN: [id number]  HPI Female with pmh of strokes x3 (most recently ~2014), DM, HTN presents for follow up of spasticity.   Initially stated: The problem is mainly in her right leg.  It causes her difficulty with ambulation.  She uses a rolling walker currently and has not had any falls.  The problems started ~ 5 years ago getting worse over time. The biggest problems seems to be dragging of her foot.  It gets worse at the end of the day.  She has some associated weakness, but denies numbness/tingling.  Previously she was prescribed Baclofen 5, which did not work and 10, which caused her too much drowsiness.  She had Botox, but this was not effective, tizanidine made her too drowsy.   She was last seen in clinic on 04/28/2018. Since that time, pt states she does not want Dysport injections anymore because she cannot afford it.  She notes benefit with injections, but cannot afford them.    Pain Inventory Average Pain 2 Pain Right Now 0 My pain is intermittent and aching  In the last 24 hours, has pain interfered with the following? General activity 4 Relation with others 5 Enjoyment of life 4 What TIME of day is your pain at its worst? evening and night Sleep (in general) Fair  Pain is worse with: bending and standing Pain improves with: rest, therapy/exercise and injections Relief from Meds: 6  Mobility walk with assistance use a walker ability to climb steps?  no do you drive?  no use a wheelchair  Function Do you have any goals in this area?  no  Neuro/Psych trouble walking confusion depression anxiety  Prior Studies Any changes since last visit?  no  Physicians involved in your care Any changes since last visit?  yes Neurologist [facility]   Family History  Problem Relation Age of Onset  . Stroke Father   . Stroke Brother    Social History   Socioeconomic History  . Marital  status: Single    Spouse name: Not on file  . Number of children: 2  . Years of education: Grad school  . Highest education level: Not on file  Occupational History  . Not on file  Social Needs  . Financial resource strain: Not on file  . Food insecurity:    Worry: Not on file    Inability: Not on file  . Transportation needs:    Medical: Not on file    Non-medical: Not on file  Tobacco Use  . Smoking status: Former Smoker    Last attempt to quit: 05/28/2007    Years since quitting: 11.2  . Smokeless tobacco: Never Used  Substance and Sexual Activity  . Alcohol use: No  . Drug use: No  . Sexual activity: Not on file  Lifestyle  . Physical activity:    Days per week: Not on file    Minutes per session: Not on file  . Stress: Not on file  Relationships  . Social connections:    Talks on phone: Not on file    Gets together: Not on file    Attends religious service: Not on file    Active member of club or organization: Not on file    Attends meetings of clubs or organizations: Not on file    Relationship status: Not on file  Other Topics Concern  . Not on file  Social History Narrative   Lives at home by herself.    Daughter come to help at home and she has a cleaning lady to help twice a week.    Caffeine: Every morning 1-1.5 cups coffee/day, and 12oz tea/day    Right-handed   Past Surgical History:  Procedure Laterality Date  . HIP ARTHROPLASTY Right 11/24/2012   Procedure: RIGHT HIP HEMIARTHROPLASTY;  Surgeon: Verlee Rossetti, MD;  Location: [facility];  Service: Orthopedics;  Laterality: Right;  hemiatroplasty, DePuy Triloc   Past Medical History:  Diagnosis Date  . Arthritis   . Diabetes mellitus   . Hypertension   . Stroke (HCC)    BP 109/76   Pulse 88   Temp 98.6 F (37 C)   Ht  (1.651 m)   Wt 164 lb (74.4 kg)   BMI 27.29 kg/m   Opioid Risk Score:   Fall Risk Score:  `1  Depression screen PHQ 2/9  Depression screen [facility] 2/9 08/12/2018 12/03/2016  06/12/2016 01/11/2016 11/01/2015 07/13/2015 06/15/2015  Decreased Interest 0 0 1 1  Down, Depressed, Hopeless 0 0 1 1  PHQ - 2 Score 0 0 2 2  Altered sleeping - - - - - - 3  Tired, decreased energy - - - - - - 2  Change in appetite - - - - - - 0  Feeling bad or failure about yourself  - - - - - - 1  Trouble concentrating - - - - - - 3  Moving slowly or fidgety/restless - - - - - - 2  Suicidal thoughts - - - - - - 0  PHQ-9 Score - - - - - - 13  Difficult doing work/chores - - - - - - Somewhat difficult   Review of Systems  Constitutional: Negative.   HENT: Negative.   Eyes: Negative.   Respiratory: Negative.   Cardiovascular: Negative.   Gastrointestinal: Negative.   Endocrine: Negative.   Genitourinary: Negative.   Musculoskeletal: Positive for gait problem.  Skin: Negative.   Allergic/Immunologic: Negative.   Hematological: Negative.   Psychiatric/Behavioral: Positive for confusion and dysphoric mood. The patient is nervous/anxious.   All other systems reviewed and are negative.      Objective:   Physical Exam Physical Exam HENT: Normocephalic, Atraumatic Eyes: EOMI. No discharge.  Cardio: No JVD. Pulm: Effort normal. Abd: Soft MSK:   Gait improved, did not bring AFO today.              No TTP.               No edema. Neuro:              Strength          4+/5 in RLE myotomes (unchanged)             MAS: Right plantar flexors: difficult to assess due to patient resistance Skin: Warm and Dry. Intact.     Assessment & Plan:  Female with pmh of strokes x3 (most recently ~2014), DM, HTN presents for follow up of spasticity.   1. RLE spasticity s/p stroke             Side effects with Tizanidine, balcofen             Cont muscle stretches.               Completed PT, cont HEP  Encouraged presence of daughter.             Cont AFO             Cont dose, plan for:                           250U Dysport to right lateral gastroc                          250U Dysport to right medial gastroc             Patient states she received a bill and cannot pay.  Discussed with staff, will follow up with Dysport and follow up with assistance program. Will follow up with patient once we receive authorization with financial support.   2. Abnormality of gait             Cont rolling walker for safety             Referral to PT

## 2018-08-12 NOTE — Progress Notes (Signed)
PATIENT: Sophia Rodriguez DOB: 10-Feb-1951  REASON FOR VISIT: follow up HISTORY FROM: patient  Virtual Visit via Telephone Note  I connected with Khari Celia on 08/12/18 at  9:00 AM EDT by telephone and verified that I am speaking with the correct person using two identifiers.   I discussed the limitations, risks, security and privacy concerns of performing an evaluation and management service by telephone and the availability of in person appointments. I also discussed with the patient that there may be a patient responsible charge related to this service. The patient expressed understanding and agreed to proceed.   History of Present Illness:  08/12/18 Sophia Rodriguez is a 68 y.o. female for follow up of multiple previous CVAs.  She is doing fairly well.  She does continue aspirin and Lipitor without any adverse effects including bleeding or myalgias.  She is following very closely with her primary care provider.  She reports that her blood pressures are usually less than 120/80.  She does continue gabapentin 300 mg 3 times a day.  She is inquiring about increasing her evening dose.  She is waking up at night with right-sided ear pain.  She is also had a couple of headaches the last few weeks.  She is uncertain if this is related to the weather.  She is walking with her walker.  Occasionally she will use a grocery cart if she goes out.  She denies any falls.    She does report concerns of aphasia.  She states that she knows what she wants to say but she cannot get the words out.  She has participated again speech therapy in the past but states " I am no more than they know."  She is not interested in a referral back to speech therapy at this time.  HISTORY (copied from Demopolis Martin's note on 07/31/2017)  Interval history 06/10/2016: Patient is here for follow up.MHx DM, HTN, infarct left frontal lobe(aca) with associated encephalomalacia, ischemic infarct involving the posterior limb of  the right internal capsule, infarcts involving the left pons, and left globus pallidus. She has right-sided weakness from left frontal stroke and left-sided weakness from right thalamic and right PLIC infarcts.She has right spastic hemiparesis. She continues to see Dr. Allena Katz in Rehab.She had side effects with tizanidine and baclofen. Dr. Allena Katz encourage stretching exercises and felt at this time Botox injections should be held off on.She feels stable. No falls. She is walking with a walker. She uses a wheelchair when she goes out for long distance. She is seeing her primary care and endorses compliance on all her medications and daily ASA 325. She is on gabapentin 3x a day and sometimes still wakes with pain behind the ear about 3x a week if she sleeps on that side. Will increase gabapentin only at night.  Interval history: She does not feel that she got any better with PT. She is still having difficulty walking. She is dragging the right leg. She has stiffness and cramps in the right leg. No falls recently. She is using her walker all the time. She takes neurontin in the morning and night. Her depression comes and goes but she is feeling better and Dr. Pearlean Brownie increased her celexa. She has a right foot orthosis but doesn't wear it. Will refer to kirsteins and swartz.   UPDATE 5/10/2019CM Ms. Stegall, 68 year old female returns for follow-up with history of multiple strokes in the past.  She remains on aspirin for secondary stroke prevention with minimal bruising and  no bleeding.  She is on Lipitor without myalgias.  Blood pressure in the office today 119/78.  She remains on Neurontin only at night for ear pain if she sleeps on that side.  She continues to get Botox most recently May 1 which she claims helps her ambulation.  She continues to use a walker, she is seated in a wheelchair today.  She has had one fall since last seen.  She did not have any injury she reports her brother died of a stroke in January.   She returns for reevaluation   Observations/Objective:  Generalized: Well developed, in no acute distress  Mentation: Alert oriented to time, place, history taking. Follows all commands speech and language fluent   Assessment and Plan:  68 y.o. year old female  has a past medical history of Arthritis, Diabetes mellitus, Hypertension, and Stroke (HCC). here with    ICD-10-CM   1. Right hemiparesis (HCC) G81.91   2. History of stroke Z86.73   3. Neuropathic pain M79.2 gabapentin (NEURONTIN) 300 MG capsule   Sophia Rodriguez is doing well.  Unfortunately she does continue to have pain at night.  We will increase her gabapentin to 300 mg in the a.m., 300 mg at lunch and 600 mg in the evening.  I am hoping that this will help with her headaches as well.  She was instructed to monitor headaches closely and call us for any worsening or new symptoms.  I have offered to send her to speech therapy.  She refuses referral.  I have encouraged her to make a journal of her symptoms to bring for review with strict provider at her follow-up.  She was encouraged to continue aspirin and Lipitor and follow-up with her primary care provider.  Blood pressure seems stable.  I have scheduled her to follow-up with Shanda BumpsJessica in 3 months.  Patient verbalizes agreement and understanding with this plan.  No orders of the defined types were placed in this encounter.   Meds ordered this encounter  Medications  . gabapentin (NEURONTIN) 300 MG capsule    Sig: Take 1 capsule (300 mg total) by mouth 3 (three) times daily. Take 1 tablet in am, 1 tablet at lunch and 2 tablets in the pm    Dispense:  120 capsule    Refill:  11    Order Specific Question:   Supervising Provider    Answer:   Anson FretAHERN, ANTONIA B [[id number]]     Follow Up Instructions:  I discussed the assessment and treatment plan with the patient. The patient was provided an opportunity to ask questions and all were answered. The patient agreed with the plan and  demonstrated an understanding of the instructions.   The patient was advised to call back or seek an in-person evaluation if the symptoms worsen or if the condition fails to improve as anticipated.  I provided 20 minutes of non-face-to-face time during this encounter.  Patient is located at her place of residence during video conference.  Provider is located in the office.  Alverda SkeansSandy Young, RN helped to facilitate visit.   Shawnie DapperAmy Niraj Kudrna, NP

## 2018-08-12 NOTE — Telephone Encounter (Signed)
Thank you, we will plan to follow up with Dysport.

## 2018-08-12 NOTE — Telephone Encounter (Signed)
error 

## 2018-08-12 NOTE — Telephone Encounter (Signed)
FYI - I reached the [employer] prior auth department - they again state that any injectable used OP for one of their insured's does not require a prior auth.  Call ref Ronnald Nian [id number] est.  I also reached out to the billing department to see why she was billed. This is the same answer I got last time.

## 2018-08-13 NOTE — Progress Notes (Signed)
I agree with the above plan 

## 2018-09-13 ENCOUNTER — Encounter: Payer: Medicare Other | Attending: Physical Medicine & Rehabilitation | Admitting: Physical Medicine & Rehabilitation

## 2018-09-13 DIAGNOSIS — R269 Unspecified abnormalities of gait and mobility: Secondary | ICD-10-CM | POA: Insufficient documentation

## 2018-09-13 DIAGNOSIS — G8310 Monoplegia of lower limb affecting unspecified side: Secondary | ICD-10-CM | POA: Insufficient documentation

## 2018-09-13 DIAGNOSIS — M62838 Other muscle spasm: Secondary | ICD-10-CM | POA: Insufficient documentation

## 2018-09-13 DIAGNOSIS — I6930 Unspecified sequelae of cerebral infarction: Secondary | ICD-10-CM | POA: Insufficient documentation

## 2018-11-16 ENCOUNTER — Ambulatory Visit: Payer: Medicare Other | Admitting: Adult Health

## 2020-04-26 ENCOUNTER — Other Ambulatory Visit: Payer: Self-pay | Admitting: Internal Medicine

## 2020-04-27 LAB — BASIC METABOLIC PANEL WITH GFR
BUN/Creatinine Ratio: 13 (calc) (ref 6–22)
BUN: 25 mg/dL (ref 7–25)
CO2: 29 mmol/L (ref 20–32)
Calcium: 10 mg/dL (ref 8.6–10.4)
Chloride: 100 mmol/L (ref 98–110)
Creat: 1.89 mg/dL — ABNORMAL HIGH (ref 0.50–0.99)
GFR, Est African American: 31 mL/min/{1.73_m2} — ABNORMAL LOW (ref >=60)
GFR, Est Non African American: 27 mL/min/{1.73_m2} — ABNORMAL LOW (ref >=60)
Glucose, Bld: 112 mg/dL — ABNORMAL HIGH (ref 65–99)
Potassium: 4.3 mmol/L (ref 3.5–5.3)
Sodium: 141 mmol/L (ref 135–146)

## 2020-04-27 LAB — CBC
HCT: 36.3 % (ref 35.0–45.0)
Hemoglobin: 11.7 g/dL (ref 11.7–15.5)
MCH: 26.2 pg — ABNORMAL LOW (ref 27.0–33.0)
MCHC: 32.2 g/dL (ref 32.0–36.0)
MCV: 81.2 fL (ref 80.0–100.0)
MPV: 10.9 fL (ref 7.5–12.5)
Platelets: 224 10*3/uL (ref 140–400)
RBC: 4.47 10*6/uL (ref 3.80–5.10)
RDW: 13.9 % (ref 11.0–15.0)
WBC: 5.6 10*3/uL (ref 3.8–10.8)

## 2020-04-30 ENCOUNTER — Other Ambulatory Visit (HOSPITAL_COMMUNITY): Payer: Self-pay | Admitting: Internal Medicine

## 2020-04-30 DIAGNOSIS — R600 Localized edema: Secondary | ICD-10-CM

## 2020-05-01 ENCOUNTER — Other Ambulatory Visit: Payer: Self-pay

## 2020-05-01 ENCOUNTER — Ambulatory Visit (HOSPITAL_COMMUNITY)
Admission: RE | Admit: 2020-05-01 | Discharge: 2020-05-01 | Disposition: A | Discharge status: Home / Self Care | Payer: Medicare Other | Source: Ambulatory Visit | Attending: Internal Medicine | Admitting: Internal Medicine

## 2020-05-01 DIAGNOSIS — R600 Localized edema: Secondary | ICD-10-CM | POA: Insufficient documentation

## 2020-05-08 ENCOUNTER — Other Ambulatory Visit: Payer: Self-pay

## 2020-05-08 ENCOUNTER — Ambulatory Visit (HOSPITAL_COMMUNITY)
Admission: RE | Admit: 2020-05-08 | Discharge: 2020-05-08 | Disposition: A | Discharge status: Home / Self Care | Payer: Medicare Other | Source: Ambulatory Visit | Attending: Internal Medicine | Admitting: Internal Medicine

## 2020-05-08 DIAGNOSIS — I10 Essential (primary) hypertension: Secondary | ICD-10-CM | POA: Insufficient documentation

## 2020-05-08 DIAGNOSIS — E119 Type 2 diabetes mellitus without complications: Secondary | ICD-10-CM | POA: Insufficient documentation

## 2020-05-08 DIAGNOSIS — I639 Cerebral infarction, unspecified: Secondary | ICD-10-CM | POA: Insufficient documentation

## 2020-05-08 DIAGNOSIS — R600 Localized edema: Secondary | ICD-10-CM | POA: Diagnosis present

## 2020-05-08 DIAGNOSIS — Z87891 Personal history of nicotine dependence: Secondary | ICD-10-CM | POA: Insufficient documentation

## 2020-05-08 LAB — ECHOCARDIOGRAM COMPLETE
Area-P 1/2: 3.42 cm2
Calc EF: 53.9 %
S' Lateral: 3.4 cm
Single Plane A2C EF: 56.1 %
Single Plane A4C EF: 52.7 %

## 2020-05-08 NOTE — Progress Notes (Signed)
  Echocardiogram 2D Echocardiogram has been performed.  Sophia Rodriguez 05/08/2020, 9:45 AM

## 2020-06-22 NOTE — Progress Notes (Signed)
DUE TO COVID-19 ONLY ONE VISITOR IS ALLOWED TO COME WITH YOU AND STAY IN THE WAITING ROOM ONLY DURING PRE OP AND PROCEDURE DAY OF SURGERY. THE 1 VISITOR  MAY VISIT WITH YOU AFTER SURGERY IN YOUR PRIVATE ROOM DURING VISITING HOURS ONLY!  YOU NEED TO HAVE A COVID 19 TEST ON__4/11/22_____ @_______ , THIS TEST MUST BE DONE BEFORE SURGERY,  COVID TESTING SITE [address] [place] , IT IS ON THE RIGHT GOING OUT [address] APPROXIMATELY  2 MINUTES PAST [employer] ON THE RIGHT. ONCE YOUR COVID TEST IS COMPLETED,  PLEASE BEGIN THE QUARANTINE INSTRUCTIONS AS OUTLINED IN YOUR HANDOUT.                Sophia Rodriguez  06/22/2020   Your procedure is scheduled on:  07/05/2020  Report to [facility] Main  Entrance   Report to admitting at     0515 AM     Call this number if you have problems the morning of surgery [phone]    REMEMBER: NO  SOLID FOOD CANDY OR GUM AFTER MIDNIGHT. CLEAR LIQUIDS UNTIL   0415am       . NOTHING BY MOUTH EXCEPT CLEAR LIQUIDS UNTIL   0415am     . PLEASE FINISH ENSURE DRINK PER SURGEON ORDER  WHICH NEEDS TO BE COMPLETED AT 0415am      .      CLEAR LIQUID DIET   Foods Allowed                                                                    Coffee and tea, regular and decaf                            Fruit ices (not with fruit pulp)                                      Iced Popsicles                                    Carbonated beverages, regular and diet                                    Cranberry, grape and apple juices Sports drinks like Gatorade Lightly seasoned clear broth or consume(fat free) Sugar, honey syrup ___________________________________________________________________      BRUSH YOUR TEETH MORNING OF SURGERY AND RINSE YOUR MOUTH OUT, NO CHEWING GUM CANDY OR MINTS.     Take these medicines the morning of surgery with A SIP OF WATER:            Citalopram  DO NOT TAKE ANY DIABETIC MEDICATIONS DAY OF YOUR  SURGERY                               You may not have any metal on your body including hair pins and  piercings  Do not wear jewelry, make-up, lotions, powders or perfumes, deodorant             Do not wear nail polish on your fingernails.  Do not shave  48 hours prior to surgery.              Men may shave face and neck.   Do not bring valuables to the hospital. [place].  Contacts, dentures or bridgework may not be worn into surgery.  Leave suitcase in the car. After surgery it may be brought to your room.     Patients discharged the day of surgery will not be allowed to drive home. IF YOU ARE HAVING SURGERY AND GOING HOME THE SAME DAY, YOU MUST HAVE AN ADULT TO DRIVE YOU HOME AND BE WITH YOU FOR 24 HOURS. YOU MAY GO HOME BY TAXI OR UBER OR ORTHERWISE, BUT AN ADULT MUST ACCOMPANY YOU HOME AND STAY WITH YOU FOR 24 HOURS.  Name and phone number of your driver:  Special Instructions: N/A              Please read over the following fact sheets you were given: _____________________________________________________________________  [facility] - Preparing for Surgery Before surgery, you can play an important role.  Because skin is not sterile, your skin needs to be as free of germs as possible.  You can reduce the number of germs on your skin by washing with CHG (chlorahexidine gluconate) soap before surgery.  CHG is an antiseptic cleaner which kills germs and bonds with the skin to continue killing germs even after washing. Please DO NOT use if you have an allergy to CHG or antibacterial soaps.  If your skin becomes reddened/irritated stop using the CHG and inform your nurse when you arrive at Short Stay. Do not shave (including legs and underarms) for at least 48 hours prior to the first CHG shower.  You may shave your face/neck. Please follow these instructions carefully:  1.  Shower with CHG Soap the night before surgery and the   morning of Surgery.  2.  If you choose to wash your hair, wash your hair first as usual with your  normal  shampoo.  3.  After you shampoo, rinse your hair and body thoroughly to remove the  shampoo.                           4.  Use CHG as you would any other liquid soap.  You can apply chg directly  to the skin and wash                       Gently with a scrungie or clean washcloth.  5.  Apply the CHG Soap to your body ONLY FROM THE NECK DOWN.   Do not use on face/ open                           Wound or open sores. Avoid contact with eyes, ears mouth and genitals (private parts).                       Wash face,  Genitals (private parts) with your normal soap.             6.  Wash thoroughly, paying special attention to the area where your surgery  will be performed.  7.  Thoroughly rinse your body with warm water from the neck down.  8.  DO NOT shower/wash with your normal soap after using and rinsing off  the CHG Soap.                9.  Pat yourself dry with a clean towel.            10.  Wear clean pajamas.            11.  Place clean sheets on your bed the night of your first shower and do not  sleep with pets. Day of Surgery : Do not apply any lotions/deodorants the morning of surgery.  Please wear clean clothes to the hospital/surgery center.  FAILURE TO FOLLOW THESE INSTRUCTIONS MAY RESULT IN THE CANCELLATION OF YOUR SURGERY PATIENT SIGNATURE_________________________________  NURSE SIGNATURE__________________________________  ________________________________________________________________________

## 2020-06-27 ENCOUNTER — Encounter (HOSPITAL_COMMUNITY)
Admission: RE | Admit: 2020-06-27 | Discharge: 2020-06-27 | Disposition: A | Discharge status: Home / Self Care | Payer: Medicare Other | Source: Ambulatory Visit | Attending: Orthopedic Surgery | Admitting: Orthopedic Surgery

## 2020-06-27 ENCOUNTER — Other Ambulatory Visit: Payer: Self-pay

## 2020-06-27 ENCOUNTER — Encounter (HOSPITAL_COMMUNITY): Payer: Self-pay

## 2020-06-27 DIAGNOSIS — Z01818 Encounter for other preprocedural examination: Secondary | ICD-10-CM | POA: Diagnosis not present

## 2020-06-27 HISTORY — DX: Depression, unspecified: F32.A

## 2020-06-27 HISTORY — DX: Headache, unspecified: R51.9

## 2020-06-27 HISTORY — DX: Dyspnea, unspecified: R06.00

## 2020-06-27 HISTORY — DX: Anxiety disorder, unspecified: F41.9

## 2020-06-27 HISTORY — DX: Edema, unspecified: R60.9

## 2020-06-27 HISTORY — DX: Chronic kidney disease, unspecified: N18.9

## 2020-06-27 LAB — CBC
HCT: 37.8 % (ref 36.0–46.0)
Hemoglobin: 11.6 g/dL — ABNORMAL LOW (ref 12.0–15.0)
MCH: 26.2 pg (ref 26.0–34.0)
MCHC: 30.7 g/dL (ref 30.0–36.0)
MCV: 85.5 fL (ref 80.0–100.0)
Platelets: 213 10*3/uL (ref 150–400)
RBC: 4.42 MIL/uL (ref 3.87–5.11)
RDW: 14 % (ref 11.5–15.5)
WBC: 4.6 10*3/uL (ref 4.0–10.5)
nRBC: 0 % (ref 0.0–0.2)

## 2020-06-27 LAB — BASIC METABOLIC PANEL
Anion gap: 9 (ref 5–15)
BUN: 10 mg/dL (ref 8–23)
CO2: 25 mmol/L (ref 22–32)
Calcium: 9.6 mg/dL (ref 8.9–10.3)
Chloride: 105 mmol/L (ref 98–111)
Creatinine, Ser: 1.23 mg/dL — ABNORMAL HIGH (ref 0.44–1.00)
GFR, Estimated: 48 mL/min — ABNORMAL LOW (ref >60)
Glucose, Bld: 122 mg/dL — ABNORMAL HIGH (ref 70–99)
Potassium: 4.4 mmol/L (ref 3.5–5.1)
Sodium: 139 mmol/L (ref 135–145)

## 2020-06-27 LAB — SURGICAL PCR SCREEN
MRSA, PCR: NEGATIVE
Staphylococcus aureus: NEGATIVE

## 2020-06-27 LAB — GLUCOSE, CAPILLARY: Glucose-Capillary: 120 mg/dL — ABNORMAL HIGH (ref 70–99)

## 2020-06-27 LAB — HEMOGLOBIN A1C
Hgb A1c MFr Bld: 6.4 % — ABNORMAL HIGH (ref 4.8–5.6)
Mean Plasma Glucose: 136.98 mg/dL

## 2020-06-27 NOTE — Progress Notes (Addendum)
Anesthesia Review:  PCP: DR Concepcion Elk :PV 05/31/2020  cALLED [phone] - and requested most recent office visit notes on 06/27/20.   Cardiologist :none Chest x-ray :06/06/20 EKG :06/27/20 Echo :05/08/2020  Stress test: Cardiac Cath :  Activity level: walker cannot do a flgiht of stairs  Sleep Study/ CPAP :no  Fasting Blood Sugar :      / Checks Blood Sugar -- times a day:   Blood Thinner/ Instructions /Last Dose: ASA / Instructions/ Last Dose :  PT reports shortness of breath with exertion.  And swelling in legs progressively getting worse since 07/2019.  Has been seen by PCP.  CXR done on 06/06/2020.  Not followed by cardiology.  Pt reports that PCp stated he was going to send her to a lung doctor but no appt has been made yet.  PT with hx of stroke, weakness on right side post stroke x 3, hx of diabetes not on meds in ? Not sure of time frame and hypertension.   Pt also reports getting tired easily,also.  PT uses [employer] for transportation.  In wheelchair with walker at preop.   Pt took off jacket for blood pressure and needed assistance. Pt states at this time that was was short of breath just taking off jacket.  Nurse noted no shortness of breath.  Sat 100% at preop.   hgba1c-06/27/20-6.4

## 2020-06-28 ENCOUNTER — Encounter (HOSPITAL_COMMUNITY): Payer: Self-pay | Admitting: Physician Assistant

## 2020-06-29 NOTE — Progress Notes (Addendum)
Anesthesia Chart Review   Case: [id number] Date/Time: 07/05/20 0700   Procedure: CONVERSION TO TOTAL HIP (Right Hip) - 90 mins Anterior/posterior to be determined per Charlann Boxer   Anesthesia type: Spinal   Pre-op diagnosis: Failed right hip hemiarthroplasty   Location: [facility] ROOM 09 / [facility]   Surgeons: Durene Romans, MD      DISCUSSION:70 y.o. former smoker with h/o HTN, Stroke, CKD, DM II diet controlled, failed right hip hemiarthroplasty scheduled for above procedure 07/05/20 with Dr. Durene Romans.   Per PCP notes pt experiencing shortness of breath with exertion. Echo ordered which was normal, chest xray ordered which was normal.  Last seen by PCP 06/19/2020. Per notes pt referred to pulmonology, etiology of shortness of breath unknown.  Clearance received from PCP which states pt is low risk for planned procedure.  Pt reports persistent shortness of breath with exertion at PST visit.  I spoke with Dr. Concepcion Elk who states at last visit her shortness of breath had improved somewhat and that she does not need to see the pulmonologist prior to hip surgery.    Discussed with anesthesia, Dr. Noreene Larsson.  Per Dr. Noreene Larsson recommend evaluation prior to hip surgery due to continue shortness of breath with exertion. VM left with Dr. Nilsa Nutting surgery scheduler. VS: BP (!) 151/79   Pulse 80   Temp 37 C (Oral)   Resp 16   SpO2 100%   PROVIDERS: Fleet Contras, MD is PCP   LABS: Labs reviewed: Acceptable for surgery. (all labs ordered are listed, but only abnormal results are displayed)  Labs Reviewed  BASIC METABOLIC PANEL - Abnormal; Notable for the following components:      Result Value   Glucose, Bld 122 (*)    Creatinine, Ser 1.23 (*)    GFR, Estimated 48 (*)    All other components within normal limits  CBC - Abnormal; Notable for the following components:   Hemoglobin 11.6 (*)    All other components within normal limits  HEMOGLOBIN A1C - Abnormal; Notable for the following components:   Hgb A1c  MFr Bld 6.4 (*)    All other components within normal limits  GLUCOSE, CAPILLARY - Abnormal; Notable for the following components:   Glucose-Capillary 120 (*)    All other components within normal limits  SURGICAL PCR SCREEN  TYPE AND SCREEN     IMAGES:   EKG: 06/27/2020 Rate 78 bpm  Normal sinus rhythm Low voltage QRS R wave in V3 not prominent - unusual R wave progression Abnormal ECG No significant change since last tracing Rate slower  CV: Echo 05/08/2020  1. Left ventricular ejection fraction, by estimation, is 50 to 55%. The  left ventricle has low normal function. The left ventricle has no regional  wall motion abnormalities. Left ventricular diastolic parameters are  consistent with Grade I diastolic  dysfunction (impaired relaxation).  2. Right ventricular systolic function is normal. The right ventricular  size is normal. There is normal pulmonary artery systolic pressure.  3. The mitral valve is normal in structure. Trivial mitral valve  regurgitation. No evidence of mitral stenosis.  4. The aortic valve is tricuspid. Aortic valve regurgitation is not  visualized. No aortic stenosis is present.  5. The inferior vena cava is normal in size with greater than 50%  respiratory variability, suggesting right atrial pressure of 3 mmHg.  Past Medical History:  Diagnosis Date  . Anxiety   . Arthritis   . Chronic kidney disease    related to blood  pressure meds- pt reports being taken off of meds   . Depression   . Diabetes mellitus    hx of not on meds, not on meds in over a year   . Dyspnea   . Edema    in legs per pt   . Headache    occ headache behind eyes   . Hypertension   . Stroke (HCC)    x 3 weakness on right side     Past Surgical History:  Procedure Laterality Date  . HIP ARTHROPLASTY Right 11/24/2012   Procedure: RIGHT HIP HEMIARTHROPLASTY;  Surgeon: Verlee Rossetti, MD;  Location: [facility];  Service: Orthopedics;  Laterality: Right;   hemiatroplasty, DePuy Triloc    MEDICATIONS: . aspirin 325 MG tablet  . atorvastatin (LIPITOR) 40 MG tablet  . Biotin 5 MG TABS  . cetirizine (ZYRTEC) 10 MG tablet  . citalopram (CELEXA) 10 MG tablet  . diclofenac Sodium (VOLTAREN) 1 % GEL  . gabapentin (NEURONTIN) 100 MG capsule  . gabapentin (NEURONTIN) 300 MG capsule  . losartan (COZAAR) 25 MG tablet  . Multiple Vitamin (MULTIVITAMIN WITH MINERALS) TABS   No current facility-administered medications for this encounter.    Jodell Cipro, PA-C [facility] Pre-Surgical Testing [phone]

## 2020-07-02 ENCOUNTER — Other Ambulatory Visit (HOSPITAL_COMMUNITY): Payer: Medicare Other

## 2020-07-05 ENCOUNTER — Encounter (HOSPITAL_COMMUNITY): Admission: RE | Payer: Self-pay | Source: Home / Self Care

## 2020-07-05 ENCOUNTER — Inpatient Hospital Stay (HOSPITAL_COMMUNITY): Admission: RE | Admit: 2020-07-05 | Payer: Medicare Other | Source: Home / Self Care | Admitting: Orthopedic Surgery

## 2020-07-05 LAB — TYPE AND SCREEN
ABO/RH(D): O POS
Antibody Screen: NEGATIVE

## 2020-07-05 SURGERY — CONVERSION, PREVIOUS HIP SURGERY, TO TOTAL HIP ARTHROPLASTY
Anesthesia: Spinal | Site: Hip | Laterality: Right

## 2020-07-27 ENCOUNTER — Other Ambulatory Visit: Payer: Self-pay

## 2020-07-27 ENCOUNTER — Ambulatory Visit: Payer: Medicare Other | Admitting: Pulmonary Disease

## 2020-07-27 ENCOUNTER — Encounter: Payer: Self-pay | Admitting: Pulmonary Disease

## 2020-07-27 VITALS — BP 122/74 | HR 93 | Temp 98.1°F | Ht 65.0 in | Wt 186.6 lb

## 2020-07-27 DIAGNOSIS — J302 Other seasonal allergic rhinitis: Secondary | ICD-10-CM

## 2020-07-27 DIAGNOSIS — R0600 Dyspnea, unspecified: Secondary | ICD-10-CM | POA: Diagnosis not present

## 2020-07-27 DIAGNOSIS — R0609 Other forms of dyspnea: Secondary | ICD-10-CM

## 2020-07-27 MED ORDER — FLUTICASONE-SALMETEROL 250-50 MCG/ACT IN AEPB: 1.0000 | INHALATION_SPRAY | Freq: Two times a day (BID) | RESPIRATORY_TRACT | 11 refills | Status: DC

## 2020-07-27 NOTE — Progress Notes (Signed)
@Patient  ID: , female    DOB: [birth year], 70 y.o.   MRN: [id number]  Chief Complaint  Patient presents with  . Consult    Referred by PCP for SOB for the past year. States the SOB has increased over the last 3-4 months. She notices the SOB more with exertion. Currently scheduled for hip surgery and needs clearance per Dr. [id number].     Referring provider: Charlann Boxer, MD  HPI:   70 year old whom we are seeing in consultation for evaluation of dyspnea on exertion.  PCP note reviewed.  Most recent neurology note reviewed.  Patient has had CVAs in the past and sometimes has word finding difficulty, slow word finding.  This did not inhibit ability to obtain history.  She has dyspnea on exertion for some months worse in the last 2 or 3 months.  Symptoms worse with exertion.  Worse with inclines or stairs.  Can be present on flat surface as well.  No positional changes that make things worse, when she rests or lies down symptoms are better.  No environmental or seasonal changes that make things better or worse.  She does endorse significant seasonal allergies and nasal congestion.  This has been a little bit worse with the pollen season of late.  No other alleviating or exacerbating factors.  She does note that she had been a bit less active over the last several months.  This is somewhat due to her shortness of breath also due to joint issues.  She had planned to have hip replacement recently but this was delayed given her worsening shortness of breath symptoms.  Most recent chest x-ray as I can view 12/2012 reveals clear lungs bilaterally without infiltrate or effusion.  She had a chest x-ray that can be viewed in Care Everywhere 05/2020 report reviewed which reveals "no acute cardiopulmonary disease."  TTE 04/2020 reviewed which revealed normal EF, grade 1 diastolic dysfunction, otherwise reassuring.  PMH: Hyperlipidemia, seasonal allergies, hypertension Surgical history: Hip  arthroplasty on the right Family history: Father with CVA, brother with CVA Social history: Former smoker, 10 pack-year history, quit in the 1980s per her report  Questionaires / Pulmonary Flowsheets:   ACT:  No flowsheet data found.  MMRC: No flowsheet data found.  Epworth:  No flowsheet data found.  Tests:   FENO:  No results found for: NITRICOXIDE  PFT: No flowsheet data found.  WALK:  No flowsheet data found.  Imaging: Personally reviewed and as per EMR discussion this note  Lab Results: Personally reviewed, no significant elevation in eosinophils, hemoglobin stable slightly low at 11.6 CBC    Component Value Date/Time   WBC 4.6 06/27/2020 0835   RBC 4.42 06/27/2020 0835   HGB 11.6 (L) 06/27/2020 0835   HCT 37.8 06/27/2020 0835   PLT 213 06/27/2020 0835   MCV 85.5 06/27/2020 0835   MCH 26.2 06/27/2020 0835   MCHC 30.7 06/27/2020 0835   RDW 14.0 06/27/2020 0835   LYMPHSABS 1.8 12/01/2014 1802   MONOABS 0.4 12/01/2014 1802   EOSABS 0.2 12/01/2014 1802   BASOSABS 0.0 12/01/2014 1802    BMET    Component Value Date/Time   NA 139 06/27/2020 0835   K 4.4 06/27/2020 0835   CL 105 06/27/2020 0835   CO2 25 06/27/2020 0835   GLUCOSE 122 (H) 06/27/2020 0835   BUN 10 06/27/2020 0835   CREATININE 1.23 (H) 06/27/2020 0835   CREATININE 1.89 (H) 04/26/2020 1014   CALCIUM 9.6 06/27/2020 0835  GFRNONAA 48 (L) 06/27/2020 0835   GFRNONAA 27 (L) 04/26/2020 1014   GFRAA 31 (L) 04/26/2020 1014    BNP No results found for: BNP  ProBNP No results found for: PROBNP  Specialty Problems   None     No Known Allergies   There is no immunization history on file for this patient.  Past Medical History:  Diagnosis Date  . Anxiety   . Arthritis   . Chronic kidney disease    related to blood pressure meds- pt reports being taken off of meds   . Depression   . Diabetes mellitus    hx of not on meds, not on meds in over a year   . Dyspnea   . Edema    in  legs per pt   . Headache    occ headache behind eyes   . Hypertension   . Stroke (HCC)    x 3 weakness on right side     Tobacco History: Social History   Tobacco Use  Smoking Status Former Smoker  . Quit date: 05/28/2007  . Years since quitting: 13.1  Smokeless Tobacco Never Used   Counseling given: Not Answered   Continue to not smoke  Outpatient Encounter Medications as of 07/27/2020  Medication Sig  . alendronate (FOSAMAX) 70 MG tablet Take 70 mg by mouth once a week.  Marland Kitchen aspirin 325 MG tablet Take 1 tablet (325 mg total) by mouth daily.  Marland Kitchen atorvastatin (LIPITOR) 40 MG tablet Take 1 tablet (40 mg total) by mouth daily at 6 PM.  . Biotin 5 MG TABS Take 5 mg by mouth daily.  . cetirizine (ZYRTEC) 10 MG tablet Take 10 mg by mouth daily as needed for allergies.  . citalopram (CELEXA) 10 MG tablet Take 10 mg by mouth daily.  . diclofenac Sodium (VOLTAREN) 1 % GEL Apply 2 g topically 4 (four) times daily as needed (pain).  . fluticasone-salmeterol (ADVAIR DISKUS) 250-50 MCG/ACT AEPB Inhale 1 puff into the lungs in the morning and at bedtime.  . gabapentin (NEURONTIN) 100 MG capsule Take 100 mg by mouth 3 (three) times daily.  Marland Kitchen losartan (COZAAR) 25 MG tablet Take 25 mg by mouth daily.  . Multiple Vitamin (MULTIVITAMIN WITH MINERALS) TABS Take 1 tablet by mouth daily.  . [DISCONTINUED] gabapentin (NEURONTIN) 300 MG capsule Take 1 capsule (300 mg total) by mouth 3 (three) times daily. Take 1 tablet in am, 1 tablet at lunch and 2 tablets in the pm (Patient not taking: No sig reported)   No facility-administered encounter medications on file as of 07/27/2020.     Review of Systems  Review of Systems  Chest pain with exertion.  No orthopnea or PND.  No lower extremity swelling.  Patient comprehensive review of systems otherwise negative. Physical Exam  BP 122/74   Pulse 93   Temp 98.1 F (36.7 C) (Temporal)   Ht 5\' 5"  (1.651 m)   Wt 186 lb 9.6 oz (84.6 kg)   SpO2 98% Comment:  on RA  BMI 31.05 kg/m   Wt Readings from Last 5 Encounters:  07/27/20 186 lb 9.6 oz (84.6 kg)  08/12/18 164 lb (74.4 kg)  04/28/18 165 lb (74.8 kg)  01/22/18 171 lb (77.6 kg)  10/22/17 171 lb (77.6 kg)    BMI Readings from Last 5 Encounters:  07/27/20 31.05 kg/m  08/12/18 27.29 kg/m  04/28/18 27.46 kg/m  01/22/18 28.46 kg/m  10/22/17 28.46 kg/m     Physical Exam General: Frail,  in no distress Eyes: EOMI, no icterus Neck: Supple no JVP Cardiovascular: Regular rhythm, no murmur Pulmonary: Clear to auscultation bilaterally, no work of breathing Abdomen: Nondistended, bowel sounds present  MSK: No synovitis, joint  neuro: Normal gait, slow to find words at times, no weakness Psych: Normal mood, flat affect   Assessment & Plan:   DOE: Likely multifactorial and related to deconditioning, concern for asthma given atopic symptoms.  Possible contribution from weight, reports some weight gain, about 15 pounds since 2022.  Recent chest imaging via chest x-ray clear.  PFTs ordered for further evaluation. Trial of ICS/LABA via mid dose Advair discus to see if improves symptoms.   Pre-operative evaluation: Pulmonary medicine does not provide "clearance" but does provide a pre-operative risk assessment. Using patient's individual factors and peripheral nature of hip surgery and assuming <3 hour operative time, patient is low risk or 1.3% risk of post-operative pulmonary complication according to ARISCAT risk calculator. No obvious modifiable risk factors. No wheeze on exam. Trial of inhaler as above.   Return in about 3 months (around 10/27/2020).   Karren Burly, MD 07/27/2020

## 2020-07-27 NOTE — Patient Instructions (Addendum)
Nice to meet you  I worry your shortness of breath could be related to something like asthma given your longstanding history of seasonal allergies.  Use Advair 1 puff twice a day.  Please rinse your mouth out after every use.  Lets see if this helps your symptoms.  If the inhaler is too expensive, please call your insurance company and ask what is the preferred inhaled corticosteroid and long-acting beta agonist inhaler.  I think some of your symptoms could be related to not being as active, being out of better condition.   I ordered pulmonary function tests or breathing testing to be performed at your convenience to help [place] better understand why you may be short of breath.  Based on your individual profile, you are low risk for hip surgery.  Feel free to contact your orthopedic doctor's office and they can obtain my note for further discussion and they can decide if now versus later is a better time to pursue the surgery.  Return to clinic in 3 months or sooner as needed for follow-up with Dr. [name] -please call if I can be of any help or if symptoms change or worsen.

## 2020-08-03 ENCOUNTER — Other Ambulatory Visit (HOSPITAL_COMMUNITY)
Admission: RE | Admit: 2020-08-03 | Discharge: 2020-08-03 | Disposition: A | Discharge status: Home / Self Care | Payer: Medicare Other | Source: Ambulatory Visit | Attending: Pulmonary Disease | Admitting: Pulmonary Disease

## 2020-08-03 DIAGNOSIS — Z20822 Contact with and (suspected) exposure to covid-19: Secondary | ICD-10-CM | POA: Insufficient documentation

## 2020-08-03 DIAGNOSIS — Z01812 Encounter for preprocedural laboratory examination: Secondary | ICD-10-CM | POA: Insufficient documentation

## 2020-08-04 LAB — SARS CORONAVIRUS 2 (TAT 6-24 HRS): SARS Coronavirus 2: NEGATIVE

## 2020-08-06 ENCOUNTER — Ambulatory Visit (INDEPENDENT_AMBULATORY_CARE_PROVIDER_SITE_OTHER): Payer: Medicare Other | Admitting: Pulmonary Disease

## 2020-08-06 ENCOUNTER — Other Ambulatory Visit: Payer: Self-pay

## 2020-08-06 DIAGNOSIS — R0600 Dyspnea, unspecified: Secondary | ICD-10-CM

## 2020-08-06 DIAGNOSIS — R0609 Other forms of dyspnea: Secondary | ICD-10-CM

## 2020-08-06 LAB — PULMONARY FUNCTION TEST
DL/VA % pred: 74 %
DL/VA: 3.07 ml/min/mmHg/L
DLCO cor % pred: 48 %
DLCO cor: 9.91 ml/min/mmHg
DLCO unc % pred: 45 %
DLCO unc: 9.31 ml/min/mmHg
FEF 25-75 Post: 0.55 L/sec
FEF 25-75 Pre: 1.3 L/sec
FEF2575-%Change-Post: -57 %
FEF2575-%Pred-Post: 29 %
FEF2575-%Pred-Pre: 70 %
FEV1-%Change-Post: -15 %
FEV1-%Pred-Post: 58 %
FEV1-%Pred-Pre: 69 %
FEV1-Post: 1.15 L
FEV1-Pre: 1.37 L
FEV1FVC-%Change-Post: -2 %
FEV1FVC-%Pred-Pre: 99 %
FEV6-%Change-Post: -15 %
FEV6-%Pred-Post: 61 %
FEV6-%Pred-Pre: 72 %
FEV6-Post: 1.5 L
FEV6-Pre: 1.77 L
FEV6FVC-%Pred-Post: 103 %
FEV6FVC-%Pred-Pre: 103 %
FVC-%Change-Post: -13 %
FVC-%Pred-Post: 60 %
FVC-%Pred-Pre: 69 %
FVC-Post: 1.53 L
FVC-Pre: 1.78 L
Post FEV1/FVC ratio: 75 %
Post FEV6/FVC ratio: 100 %
Pre FEV1/FVC ratio: 77 %
Pre FEV6/FVC Ratio: 100 %
RV % pred: 72 %
RV: 1.59 L
TLC % pred: 68 %
TLC: 3.58 L

## 2020-08-06 NOTE — Progress Notes (Signed)
Spiro/pre and post and dlco performed today. Patient was not able to keep frequency for pleth and not able to complete nitrogen washout.

## 2020-08-06 NOTE — Patient Instructions (Signed)
Spirometry/pre and post and dlco performed today.

## 2020-08-14 ENCOUNTER — Telehealth: Payer: Self-pay | Admitting: Pulmonary Disease

## 2020-08-14 ENCOUNTER — Other Ambulatory Visit: Payer: Self-pay | Admitting: Pulmonary Disease

## 2020-08-14 DIAGNOSIS — R942 Abnormal results of pulmonary function studies: Secondary | ICD-10-CM

## 2020-08-14 NOTE — Telephone Encounter (Signed)
Called and spoke with patient who was returning phone call to go over PFT results per Dr [name].  Hunsucker, Lesia Sago, MD at 08/14/2020 3:19 PM  Status: Signed  PFT concerning for possible ILD. CT high res ordered.     Patient made aware of result word for word per Dr [name] and aware that Dr [name] has ordered a High Resolution CT. Patient ok with this and expressed full understanding. Nothing further needed at this time.

## 2020-08-14 NOTE — Progress Notes (Signed)
Called patient but she did not answer. Left message for her to call back.  

## 2020-08-14 NOTE — Progress Notes (Signed)
Patient returned phone call, went over PFT results and order placed. See telephone note from today 08/14/20. Nothing further needed at this time

## 2020-08-14 NOTE — Progress Notes (Signed)
PFT concerning for possible ILD. CT high res ordered.

## 2020-08-28 ENCOUNTER — Ambulatory Visit (INDEPENDENT_AMBULATORY_CARE_PROVIDER_SITE_OTHER)
Admission: RE | Admit: 2020-08-28 | Discharge: 2020-08-28 | Disposition: A | Discharge status: Home / Self Care | Payer: Medicare Other | Source: Ambulatory Visit | Attending: Pulmonary Disease | Admitting: Pulmonary Disease

## 2020-08-28 ENCOUNTER — Other Ambulatory Visit: Payer: Self-pay

## 2020-08-28 DIAGNOSIS — R942 Abnormal results of pulmonary function studies: Secondary | ICD-10-CM | POA: Diagnosis not present

## 2020-08-29 ENCOUNTER — Other Ambulatory Visit: Payer: Self-pay | Admitting: Pulmonary Disease

## 2020-08-29 DIAGNOSIS — E041 Nontoxic single thyroid nodule: Secondary | ICD-10-CM

## 2020-08-29 NOTE — Progress Notes (Signed)
CT chest shows no ILD, some emphysema. Thyroid nodule seen. Thyroid US ordered per radiology recommendations. Will need to repeat CT chest in 1 year to follow tiny pulmonary nodule seen. Please let Sophia Rodriguez know.

## 2020-09-05 ENCOUNTER — Other Ambulatory Visit: Payer: Self-pay

## 2020-09-05 ENCOUNTER — Ambulatory Visit (HOSPITAL_COMMUNITY)
Admission: RE | Admit: 2020-09-05 | Discharge: 2020-09-05 | Disposition: A | Discharge status: Home / Self Care | Payer: Medicare Other | Source: Ambulatory Visit | Attending: Pulmonary Disease | Admitting: Pulmonary Disease

## 2020-09-05 DIAGNOSIS — E041 Nontoxic single thyroid nodule: Secondary | ICD-10-CM

## 2020-09-17 ENCOUNTER — Telehealth: Payer: Self-pay | Admitting: Pulmonary Disease

## 2020-09-17 NOTE — Telephone Encounter (Signed)
There are some spots on thyroid that are large enough that biopsy is recommended. I reached out to her PCP and he graciously agreed to coordinate ongoing work up.

## 2020-09-17 NOTE — Telephone Encounter (Signed)
Called and spoke with patient, advised of results/recommendations per Dr. [name].  She verbalized understanding.  Nothing further needed.

## 2020-09-17 NOTE — Telephone Encounter (Signed)
Called and spoke with patient, advised patient that I saw that she had an ultrasound of her thyroid for thyroid nodules, however, do not have results/recommendations per Dr. [name] at this time.  I let her know that he is working at the hospital currently, however, would let him know that she is calling for the results and once we heard back from her we would call her back.  She verbalized understanding.  Dr. [name], Patient is calling for results of ultrasound of thyroid, please advise.  Thank you.

## 2020-09-20 ENCOUNTER — Other Ambulatory Visit: Payer: Medicare Other

## 2020-09-21 ENCOUNTER — Telehealth: Payer: Self-pay | Admitting: Pulmonary Disease

## 2020-09-21 NOTE — Telephone Encounter (Signed)
Spoke to patient, who is requesting notes and test to be faxed to PCP. This has been faxed to PCP via epic.  She would also like to know next steps.  Dr. [name], please advise. thanks

## 2020-09-25 NOTE — Telephone Encounter (Signed)
I called and spoke with the pt and scheduled appt with MH for 10/16/20 at 4:15 pm.

## 2020-09-25 NOTE — Telephone Encounter (Signed)
We should get a follow up appointment with me in the coming weeks to see if breathing is better or worse on the inhaler I recommended. If not we need to consider other options.

## 2020-10-16 ENCOUNTER — Ambulatory Visit: Payer: Medicare Other | Admitting: Pulmonary Disease

## 2020-10-16 ENCOUNTER — Other Ambulatory Visit: Payer: Self-pay

## 2020-10-16 ENCOUNTER — Encounter: Payer: Self-pay | Admitting: Pulmonary Disease

## 2020-10-16 VITALS — BP 122/80 | HR 93 | Ht 65.0 in | Wt 187.0 lb

## 2020-10-16 DIAGNOSIS — R0609 Other forms of dyspnea: Secondary | ICD-10-CM

## 2020-10-16 DIAGNOSIS — R0600 Dyspnea, unspecified: Secondary | ICD-10-CM

## 2020-10-16 DIAGNOSIS — J438 Other emphysema: Secondary | ICD-10-CM | POA: Diagnosis not present

## 2020-10-16 MED ORDER — TRELEGY ELLIPTA 200-62.5-25 MCG/INH IN AEPB: 1.0000 | INHALATION_SPRAY | Freq: Every day | RESPIRATORY_TRACT | 11 refills | Status: DC

## 2020-10-16 MED ORDER — TRELEGY ELLIPTA 100-62.5-25 MCG/INH IN AEPB: 1.0000 | INHALATION_SPRAY | Freq: Every day | RESPIRATORY_TRACT | 0 refills | Status: DC

## 2020-10-16 NOTE — Patient Instructions (Signed)
Nice to see you again  Stop Advair  Use Trelegy 1 puff daily. Rinse mouth after every use. Let me know if too expensive.  I am ok with the hip surgery, please let me know if I can help in any way. I think being more active will help your breathing.   Follow up with Dr. [name] in 3 months

## 2020-10-17 NOTE — Progress Notes (Signed)
@Patient  ID: , female    DOB: Jul 29, 1950, 70 y.o.   MRN: [id number]  Chief Complaint  Patient presents with   Follow-up    Inhaler not helping, long distance walk out of breath and having some wheezing.     Referring provider: [id number], MD  HPI:   70 year old whom we are seeing for follow up of dyspnea on exertion.  Patient returns for follow-up.  In the interim had PFTs.  These were consistent with moderate restriction with severely reduced DLCO.  Raise concern for ILD.  CT high-res was performed which showed no evidence of ILD or fibrosis but did demonstrate emphysema on my interpretation.  Thyroid nodules are present.  Thyroid ultrasound was ordered which demonstrated thyroid nodules in better detail.  Messaged PCP and he is arranging biopsy.  Patient has appointment with surgeon upcoming.  All these developments were explained in detail to the patient.  Suspect combination of emphysema and increased chest wall interference causing some restrictive defect.  Advair do not seem very helpful per her report.  She has ongoing hip issues which limits her mobility.  Suspect deconditioning playing a large role as well.  Discussed that I am okay with her getting surgery soon as her surgery team is comfortable.  Surgery risk assessment was provided at last visit 07/2020.  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  This did not inhibit ability to obtain history.  She has dyspnea on exertion for some months worse in the last 2 or 3 months.  Symptoms worse with exertion.  Worse with inclines or stairs.  Can be present on flat surface as well.  No positional changes that make things worse, when she rests or lies down symptoms are better.  No environmental or seasonal changes that make things better or worse.  She does endorse significant seasonal allergies and nasal congestion.  This has been a little bit worse with  the pollen season of late.  No other alleviating or exacerbating factors.  She does note that she had been a bit less active over the last several months.  This is somewhat due to her shortness of breath also due to joint issues.  She had planned to have hip replacement recently but this was delayed given her worsening shortness of breath symptoms.  Most recent chest x-ray as I can view 12/2012 reveals clear lungs bilaterally without infiltrate or effusion.  She had a chest x-ray that can be viewed in Care Everywhere 05/2020 report reviewed which reveals "no acute cardiopulmonary disease."  TTE 04/2020 reviewed which revealed normal EF, grade 1 diastolic dysfunction, otherwise reassuring.  PMH: Hyperlipidemia, seasonal allergies, hypertension Surgical history: Hip arthroplasty on the right Family history: Father with CVA, brother with CVA Social history: Former smoker, 10 pack-year history, quit in the 1980s per her report  Questionaires / Pulmonary Flowsheets:   ACT:  No flowsheet data found.  MMRC: No flowsheet data found.  Epworth:  No flowsheet data found.  Tests:   FENO:  No results found for: NITRICOXIDE  PFT: PFT Results Latest Ref Rng & Units 08/06/2020  FVC-Pre L 1.78  FVC-Predicted Pre % 69  FVC-Post L 1.53  FVC-Predicted Post % 60  Pre FEV1/FVC % % 77  Post FEV1/FCV % % 75  FEV1-Pre L 1.37  FEV1-Predicted Pre % 69  FEV1-Post L 1.15  DLCO uncorrected ml/min/mmHg 9.31  DLCO UNC% % 45  DLCO corrected ml/min/mmHg  9.91  DLCO COR %Predicted % 48  DLVA Predicted % 74  TLC L 3.58  TLC % Predicted % 68  RV % Predicted % 72  Personally reviewed and interpreted as spirometry suggestive of moderate restriction versus gas trapping.  TLC confirms moderate restriction.  DLCO is severely reduced.  WALK:  No flowsheet data found.  Imaging: Personally reviewed and as per EMR discussion this note  Lab Results: Personally reviewed, no significant elevation in eosinophils,  hemoglobin stable slightly low at 11.6 CBC    Component Value Date/Time   WBC 4.6 06/27/2020 0835   RBC 4.42 06/27/2020 0835   HGB 11.6 (L) 06/27/2020 0835   HCT 37.8 06/27/2020 0835   PLT 213 06/27/2020 0835   MCV 85.5 06/27/2020 0835   MCH 26.2 06/27/2020 0835   MCHC 30.7 06/27/2020 0835   RDW 14.0 06/27/2020 0835   LYMPHSABS 1.8 12/01/2014 1802   MONOABS 0.4 12/01/2014 1802   EOSABS 0.2 12/01/2014 1802   BASOSABS 0.0 12/01/2014 1802    BMET    Component Value Date/Time   NA 139 06/27/2020 0835   K 4.4 06/27/2020 0835   CL 105 06/27/2020 0835   CO2 25 06/27/2020 0835   GLUCOSE 122 (H) 06/27/2020 0835   BUN 10 06/27/2020 0835   CREATININE 1.23 (H) 06/27/2020 0835   CREATININE 1.89 (H) 04/26/2020 1014   CALCIUM 9.6 06/27/2020 0835   GFRNONAA 48 (L) 06/27/2020 0835   GFRNONAA 27 (L) 04/26/2020 1014   GFRAA 31 (L) 04/26/2020 1014    BNP No results found for: BNP  ProBNP No results found for: PROBNP  Specialty Problems   None  No Known Allergies  Immunization History  Administered Date(s) Administered   PFIZER(Purple Top)SARS-COV-2 Vaccination 05/07/2019, 06/01/2019, 01/03/2020    Past Medical History:  Diagnosis Date   Anxiety    Arthritis    Chronic kidney disease    related to blood pressure meds- pt reports being taken off of meds    Depression    Diabetes mellitus    hx of not on meds, not on meds in over a year    Dyspnea    Edema    in legs per pt    Headache    occ headache behind eyes    Hypertension    Stroke (HCC)    x 3 weakness on right side     Tobacco History: Social History   Tobacco Use  Smoking Status Former   Types: Cigarettes   Start date: 1971   Quit date: 05/28/2007   Years since quitting: 13.4  Smokeless Tobacco Never   Counseling given: Not Answered   Continue to not smoke  Outpatient Encounter Medications as of 10/16/2020  Medication Sig   alendronate (FOSAMAX) 70 MG tablet Take 70 mg by mouth once a week.    aspirin 325 MG tablet Take 1 tablet (325 mg total) by mouth daily.   atorvastatin (LIPITOR) 40 MG tablet Take 1 tablet (40 mg total) by mouth daily at 6 PM.   Biotin 5 MG TABS Take 5 mg by mouth daily.   cetirizine (ZYRTEC) 10 MG tablet Take 10 mg by mouth daily as needed for allergies.   citalopram (CELEXA) 10 MG tablet Take 10 mg by mouth daily.   diclofenac Sodium (VOLTAREN) 1 % GEL Apply 2 g topically 4 (four) times daily as needed (pain).   Fluticasone-Umeclidin-Vilant (TRELEGY ELLIPTA) 100-62.5-25 MCG/INH AEPB Inhale 1 puff into the lungs daily.   Fluticasone-Umeclidin-Vilant (TRELEGY ELLIPTA)  200-62.5-25 MCG/INH AEPB Inhale 1 puff into the lungs daily.   furosemide (LASIX) 40 MG tablet Take 40 mg by mouth daily as needed.   gabapentin (NEURONTIN) 100 MG capsule Take 100 mg by mouth 3 (three) times daily.   lisinopril (ZESTRIL) 10 MG tablet Take 10 mg by mouth daily.   losartan (COZAAR) 25 MG tablet Take 25 mg by mouth daily.   Multiple Vitamin (MULTIVITAMIN WITH MINERALS) TABS Take 1 tablet by mouth daily.   zolpidem (AMBIEN) 10 MG tablet Take 10 mg by mouth at bedtime as needed.   [DISCONTINUED] fluticasone-salmeterol (ADVAIR DISKUS) 250-50 MCG/ACT AEPB Inhale 1 puff into the lungs in the morning and at bedtime.   No facility-administered encounter medications on file as of 10/16/2020.     Review of Systems  Review of Systems  N/a Physical Exam  BP 122/80   Pulse 93   Ht 5\' 5"  (1.651 m)   Wt 187 lb (84.8 kg)   SpO2 99%   BMI 31.12 kg/m   Wt Readings from Last 5 Encounters:  10/16/20 187 lb (84.8 kg)  07/27/20 186 lb 9.6 oz (84.6 kg)  08/12/18 164 lb (74.4 kg)  04/28/18 165 lb (74.8 kg)  01/22/18 171 lb (77.6 kg)    BMI Readings from Last 5 Encounters:  10/16/20 31.12 kg/m  07/27/20 31.05 kg/m  08/12/18 27.29 kg/m  04/28/18 27.46 kg/m  01/22/18 28.46 kg/m     Physical Exam General: Frail, in no distress Eyes: EOMI, no icterus Neck: Supple no  JVP Cardiovascular: Regular rhythm, no murmur Pulmonary: Clear to auscultation bilaterally, no work of breathing Abdomen: Nondistended, bowel sounds present  MSK: No synovitis, joint  neuro: Normal gait, slow to find words at times, no weakness Psych: Normal mood, flat affect   Assessment & Plan:   DOE: Suspect multifactorial but concern for deconditioning playing a major role.  In setting of MSK issues and difficulty ambulating.  PFTs with restriction and reduced DLCO.  No ILD on PFTs, emphysema present.  Suspect emphysema contributing.  Did not respond well to Advair.  Escalate to Trelegy.  Recommend graduated exercise program, suspect would benefit from pulmonary rehab in the future.  Pre-operative evaluation: Pulmonary medicine does not provide "clearance" but does provide a pre-operative risk assessment. Using patient's individual factors and peripheral nature of hip surgery and assuming <3 hour operative time, patient is low risk or 1.3% risk of post-operative pulmonary complication according to ARISCAT risk calculator. No obvious modifiable risk factors.    Return in about 3 months (around 01/16/2021).   01/18/2021, MD 10/17/2020

## 2021-01-15 ENCOUNTER — Other Ambulatory Visit: Payer: Self-pay

## 2021-01-15 ENCOUNTER — Ambulatory Visit: Payer: Medicare Other | Admitting: Pulmonary Disease

## 2021-01-15 ENCOUNTER — Encounter: Payer: Self-pay | Admitting: Pulmonary Disease

## 2021-01-15 VITALS — BP 132/84 | HR 94 | Ht 65.0 in | Wt 188.6 lb

## 2021-01-15 DIAGNOSIS — J438 Other emphysema: Secondary | ICD-10-CM

## 2021-01-15 DIAGNOSIS — R0609 Other forms of dyspnea: Secondary | ICD-10-CM

## 2021-01-15 MED ORDER — ALBUTEROL SULFATE HFA 108 (90 BASE) MCG/ACT IN AERS: 2.0000 | INHALATION_SPRAY | Freq: Four times a day (QID) | RESPIRATORY_TRACT | 6 refills | Status: DC | PRN

## 2021-01-15 NOTE — Patient Instructions (Addendum)
Nice to see you again  Stop Trelegy.  Use albuterol 2 puffs 20 minutes before activity.  You can use it as needed as well if you are out and about and feel short of breath.  Use this for the next 4 to 6 weeks and see me a message and let me know if it helps or not.  If is not very helpful, we need to consider pulmonary rehab.  I know this will be difficult with your joint pain.  If the albuterol is helpful, I will send in a prescription for Breztri, a new inhaler or puffer that is similar to Trelegy but different delivery method to see if this is more helpful.  Return to clinic for follow-up with Dr. [name] in 3 months or sooner as needed

## 2021-01-15 NOTE — Progress Notes (Signed)
@Patient  ID: , female    DOB: [birth year], 70 y.o.   MRN: [id number]  Chief Complaint  Patient presents with   Follow-up    Referring provider: [id number], MD  HPI:   69 year old whom we are seeing for follow up of dyspnea on exertion.  Patient returns for follow-up.  Reviewed PFTs with patient again.  These were consistent with moderate restriction with severely reduced DLCO.  Reviewed CT high-res which showed no evidence of ILD or fibrosis but did demonstrate emphysema on my interpretation.  Escalated ICS/LABA to Trelegy at last visit.  Trelegy not very helpful.  Still very dyspneic.  Not very active.  Still with hip pain.  Hip surgery was not done.  I provided preoperative evaluation at last visit.  Despite this, did not move forward.  Discussed at length that likely related to deconditioning, lack of stamina.     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  This did not inhibit ability to obtain history.  She has dyspnea on exertion for some months worse in the last 2 or 3 months.  Symptoms worse with exertion.  Worse with inclines or stairs.  Can be present on flat surface as well.  No positional changes that make things worse, when she rests or lies down symptoms are better.  No environmental or seasonal changes that make things better or worse.  She does endorse significant seasonal allergies and nasal congestion.  This has been a little bit worse with the pollen season of late.  No other alleviating or exacerbating factors.  She does note that she had been a bit less active over the last several months.  This is somewhat due to her shortness of breath also due to joint issues.  She had planned to have hip replacement recently but this was delayed given her worsening shortness of breath symptoms.  Most recent chest x-ray as I can view 12/2012 reveals clear lungs bilaterally without infiltrate or  effusion.  She had a chest x-ray that can be viewed in Care Everywhere 05/2020 report reviewed which reveals "no acute cardiopulmonary disease."  TTE 04/2020 reviewed which revealed normal EF, grade 1 diastolic dysfunction, otherwise reassuring.  PMH: Hyperlipidemia, seasonal allergies, hypertension Surgical history: Hip arthroplasty on the right Family history: Father with CVA, brother with CVA Social history: Former smoker, 10 pack-year history, quit in the 1980s per her report  Questionaires / Pulmonary Flowsheets:   ACT:  No flowsheet data found.  MMRC: No flowsheet data found.  Epworth:  No flowsheet data found.  Tests:   FENO:  No results found for: NITRICOXIDE  PFT: PFT Results Latest Ref Rng & Units 08/06/2020  FVC-Pre L 1.78  FVC-Predicted Pre % 69  FVC-Post L 1.53  FVC-Predicted Post % 60  Pre FEV1/FVC % % 77  Post FEV1/FCV % % 75  FEV1-Pre L 1.37  FEV1-Predicted Pre % 69  FEV1-Post L 1.15  DLCO uncorrected ml/min/mmHg 9.31  DLCO UNC% % 45  DLCO corrected ml/min/mmHg 9.91  DLCO COR %Predicted % 48  DLVA Predicted % 74  TLC L 3.58  TLC % Predicted % 68  RV % Predicted % 72  Personally reviewed and interpreted as spirometry suggestive of moderate restriction versus gas trapping.  TLC confirms moderate restriction.  DLCO is severely reduced.  WALK:  No flowsheet data found.  Imaging: Personally reviewed and as per EMR discussion this note  Lab Results: Personally  reviewed, no significant elevation in eosinophils, hemoglobin stable slightly low at 11.6 CBC    Component Value Date/Time   WBC 4.6 06/27/2020 0835   RBC 4.42 06/27/2020 0835   HGB 11.6 (L) 06/27/2020 0835   HCT 37.8 06/27/2020 0835   PLT 213 06/27/2020 0835   MCV 85.5 06/27/2020 0835   MCH 26.2 06/27/2020 0835   MCHC 30.7 06/27/2020 0835   RDW 14.0 06/27/2020 0835   LYMPHSABS 1.8 12/01/2014 1802   MONOABS 0.4 12/01/2014 1802   EOSABS 0.2 12/01/2014 1802   BASOSABS 0.0 12/01/2014 1802     BMET    Component Value Date/Time   NA 139 06/27/2020 0835   K 4.4 06/27/2020 0835   CL 105 06/27/2020 0835   CO2 25 06/27/2020 0835   GLUCOSE 122 (H) 06/27/2020 0835   BUN 10 06/27/2020 0835   CREATININE 1.23 (H) 06/27/2020 0835   CREATININE 1.89 (H) 04/26/2020 1014   CALCIUM 9.6 06/27/2020 0835   GFRNONAA 48 (L) 06/27/2020 0835   GFRNONAA 27 (L) 04/26/2020 1014   GFRAA 31 (L) 04/26/2020 1014    BNP No results found for: BNP  ProBNP No results found for: PROBNP  Specialty Problems   None  No Known Allergies  Immunization History  Administered Date(s) Administered   PFIZER(Purple Top)SARS-COV-2 Vaccination 05/07/2019, 06/01/2019, 01/03/2020    Past Medical History:  Diagnosis Date   Anxiety    Arthritis    Chronic kidney disease    related to blood pressure meds- pt reports being taken off of meds    Depression    Diabetes mellitus    hx of not on meds, not on meds in over a year    Dyspnea    Edema    in legs per pt    Headache    occ headache behind eyes    Hypertension    Stroke (HCC)    x 3 weakness on right side     Tobacco History: Social History   Tobacco Use  Smoking Status Former   Types: Cigarettes   Start date: 1971   Quit date: 05/28/2007   Years since quitting: 13.6  Smokeless Tobacco Never   Counseling given: Not Answered   Continue to not smoke  Outpatient Encounter Medications as of 01/15/2021  Medication Sig   alendronate (FOSAMAX) 70 MG tablet Take 70 mg by mouth once a week.   aspirin 325 MG tablet Take 1 tablet (325 mg total) by mouth daily.   atorvastatin (LIPITOR) 40 MG tablet Take 1 tablet (40 mg total) by mouth daily at 6 PM.   Biotin 5 MG TABS Take 5 mg by mouth daily.   cetirizine (ZYRTEC) 10 MG tablet Take 10 mg by mouth daily as needed for allergies.   citalopram (CELEXA) 10 MG tablet Take 10 mg by mouth daily.   diclofenac Sodium (VOLTAREN) 1 % GEL Apply 2 g topically 4 (four) times daily as needed (pain).    Fluticasone-Umeclidin-Vilant (TRELEGY ELLIPTA) 100-62.5-25 MCG/INH AEPB Inhale 1 puff into the lungs daily.   Fluticasone-Umeclidin-Vilant (TRELEGY ELLIPTA) 200-62.5-25 MCG/INH AEPB Inhale 1 puff into the lungs daily.   furosemide (LASIX) 40 MG tablet Take 40 mg by mouth daily as needed.   gabapentin (NEURONTIN) 100 MG capsule Take 100 mg by mouth 3 (three) times daily.   lisinopril (ZESTRIL) 10 MG tablet Take 10 mg by mouth daily.   losartan (COZAAR) 25 MG tablet Take 25 mg by mouth daily.   Multiple Vitamin (MULTIVITAMIN WITH MINERALS)  TABS Take 1 tablet by mouth daily.   zolpidem (AMBIEN) 10 MG tablet Take 10 mg by mouth at bedtime as needed.   No facility-administered encounter medications on file as of 01/15/2021.     Review of Systems  Review of Systems  N/a Physical Exam  There were no vitals taken for this visit.  Wt Readings from Last 5 Encounters:  10/16/20 187 lb (84.8 kg)  07/27/20 186 lb 9.6 oz (84.6 kg)  08/12/18 164 lb (74.4 kg)  04/28/18 165 lb (74.8 kg)  01/22/18 171 lb (77.6 kg)    BMI Readings from Last 5 Encounters:  10/16/20 31.12 kg/m  07/27/20 31.05 kg/m  08/12/18 27.29 kg/m  04/28/18 27.46 kg/m  01/22/18 28.46 kg/m     Physical Exam General: Frail, in no distress Eyes: EOMI, no icterus Neck: Supple no JVP Cardiovascular: Regular rhythm, no murmur Pulmonary: Clear to auscultation bilaterally, normal work of breathing Abdomen: Nondistended, bowel sounds present  MSK: No synovitis, joint  neuro: Normal gait, slow to find words at times, no weakness Psych: Normal mood, flat affect   Assessment & Plan:   DOE: Suspect multifactorial but concern for deconditioning playing a major role.  In setting of MSK issues and difficulty ambulating.  PFTs with restriction and reduced DLCO.  No ILD on PFTs, emphysema present.  Suspect emphysema contributing.  Did not respond well to Advair.  Escalated to Trelegy 7/22 without response.  Will trial albuterol  as needed.  If helpful, will prescribe Breztri as perhaps HFA will elicit better response to dry powder inhaler.  If not very helpful, not much more to add from inhaler pulmonary standpoint recommend graduated exercise program, suspect would benefit from pulmonary rehab in the future.  Limited by her MSK issues.  Emphysema: Related to history of cigarette smoking.  Contributor symptoms.  Not responding to inhalers.  Suspect deconditioning major issue.     No follow-ups on file.   Karren Burly, MD 01/15/2021

## 2021-01-30 ENCOUNTER — Other Ambulatory Visit: Payer: Self-pay | Admitting: Surgery

## 2021-01-30 DIAGNOSIS — E042 Nontoxic multinodular goiter: Secondary | ICD-10-CM

## 2021-02-28 ENCOUNTER — Other Ambulatory Visit (HOSPITAL_COMMUNITY)
Admission: RE | Admit: 2021-02-28 | Discharge: 2021-02-28 | Disposition: A | Discharge status: Home / Self Care | Payer: Medicare Other | Source: Ambulatory Visit | Attending: Surgery | Admitting: Surgery

## 2021-02-28 ENCOUNTER — Ambulatory Visit
Admission: RE | Admit: 2021-02-28 | Discharge: 2021-02-28 | Disposition: A | Discharge status: Home / Self Care | Payer: Medicare Other | Source: Ambulatory Visit | Attending: Surgery | Admitting: Surgery

## 2021-02-28 DIAGNOSIS — E042 Nontoxic multinodular goiter: Secondary | ICD-10-CM | POA: Diagnosis present

## 2021-03-01 LAB — CYTOLOGY - NON PAP

## 2021-03-19 ENCOUNTER — Emergency Department (HOSPITAL_BASED_OUTPATIENT_CLINIC_OR_DEPARTMENT_OTHER)
Admission: EM | Admit: 2021-03-19 | Discharge: 2021-03-19 | Disposition: A | Discharge status: Home / Self Care | Payer: Medicare Other | Attending: Emergency Medicine | Admitting: Emergency Medicine

## 2021-03-19 ENCOUNTER — Encounter (HOSPITAL_BASED_OUTPATIENT_CLINIC_OR_DEPARTMENT_OTHER): Payer: Self-pay | Admitting: Emergency Medicine

## 2021-03-19 ENCOUNTER — Emergency Department (HOSPITAL_BASED_OUTPATIENT_CLINIC_OR_DEPARTMENT_OTHER): Payer: Medicare Other

## 2021-03-19 ENCOUNTER — Emergency Department (HOSPITAL_BASED_OUTPATIENT_CLINIC_OR_DEPARTMENT_OTHER): Payer: Medicare Other | Admitting: Radiology

## 2021-03-19 ENCOUNTER — Other Ambulatory Visit: Payer: Self-pay

## 2021-03-19 DIAGNOSIS — I129 Hypertensive chronic kidney disease with stage 1 through stage 4 chronic kidney disease, or unspecified chronic kidney disease: Secondary | ICD-10-CM | POA: Diagnosis not present

## 2021-03-19 DIAGNOSIS — N189 Chronic kidney disease, unspecified: Secondary | ICD-10-CM | POA: Diagnosis not present

## 2021-03-19 DIAGNOSIS — M25559 Pain in unspecified hip: Secondary | ICD-10-CM

## 2021-03-19 DIAGNOSIS — Z87891 Personal history of nicotine dependence: Secondary | ICD-10-CM | POA: Diagnosis not present

## 2021-03-19 DIAGNOSIS — M25551 Pain in right hip: Secondary | ICD-10-CM | POA: Insufficient documentation

## 2021-03-19 DIAGNOSIS — E1122 Type 2 diabetes mellitus with diabetic chronic kidney disease: Secondary | ICD-10-CM | POA: Diagnosis not present

## 2021-03-19 DIAGNOSIS — W19XXXA Unspecified fall, initial encounter: Secondary | ICD-10-CM

## 2021-03-19 DIAGNOSIS — Z79899 Other long term (current) drug therapy: Secondary | ICD-10-CM | POA: Diagnosis not present

## 2021-03-19 DIAGNOSIS — E1149 Type 2 diabetes mellitus with other diabetic neurological complication: Secondary | ICD-10-CM | POA: Insufficient documentation

## 2021-03-19 DIAGNOSIS — Z7982 Long term (current) use of aspirin: Secondary | ICD-10-CM | POA: Diagnosis not present

## 2021-03-19 MED ORDER — HYDROCODONE-ACETAMINOPHEN 5-325 MG PO TABS
1.0000 | ORAL_TABLET | Freq: Once | ORAL | Status: AC
  Administered 2021-03-19: 15:00:00 1 via ORAL
  Filled 2021-03-19: qty 1

## 2021-03-19 MED ORDER — HYDROCODONE-ACETAMINOPHEN 5-325 MG PO TABS: 1.0000 | ORAL_TABLET | Freq: Four times a day (QID) | ORAL | 0 refills | Status: DC | PRN

## 2021-03-19 MED ORDER — PREDNISONE 20 MG PO TABS: 40.0000 mg | ORAL_TABLET | Freq: Every day | ORAL | 0 refills | Status: AC

## 2021-03-19 NOTE — ED Notes (Signed)
When RN tried to dc patient, patient had questions for MD.MD Wallace Cullens made aware.

## 2021-03-19 NOTE — ED Provider Notes (Signed)
[facility] EMERGENCY DEPT Provider Note   CSN: [id number] Arrival date & time: 03/19/21  1256     History Chief Complaint  Patient presents with   Sophia Rodriguez    Sophia Rodriguez is a 70 y.o. female.  Patient is a 70 yo female presenting for right sided hip pain after mechanical fall. Patient admits to right sided hip pain and tailbone paine.  Denies head trauma, blood thinner use, or loc. Uses walker.   The history is provided by the patient. No language interpreter was used.  Fall Pertinent negatives include no chest pain, no abdominal pain and no shortness of breath.      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     Family History  Problem Relation Age of Onset   Stroke Father    Stroke Brother     Social History   Tobacco Use   Smoking status: Former    Types:  Cigarettes    Start date: 1971    Quit date: 05/28/2007    Years since quitting: 13.8   Smokeless tobacco: Never  Vaping Use   Vaping Use: Never used  Substance Use Topics   Alcohol use: No   Drug use: No    Home Medications Prior to Admission medications   Medication Sig Start Date End Date Taking? Authorizing Provider  albuterol (VENTOLIN HFA) 108 (90 Base) MCG/ACT inhaler Inhale 2 puffs into the lungs every 6 (six) hours as needed for wheezing or shortness of breath. 01/15/21   Hunsucker, Lesia Sago, MD  alendronate (FOSAMAX) 70 MG tablet Take 70 mg by mouth once a week. 07/11/20   [provider]  aspirin 325 MG tablet Take 1 tablet (325 mg total) by mouth daily. 10/05/12   Angiulli, Mcarthur Rossetti, PA-C  atorvastatin (LIPITOR) 40 MG tablet Take 1 tablet (40 mg total) by mouth daily at 6 PM. 10/05/12   Angiulli, Mcarthur Rossetti, PA-C  Biotin 5 MG TABS Take 5 mg by mouth daily.    [provider]  cetirizine (ZYRTEC) 10 MG tablet Take 10 mg by mouth daily as needed for allergies.    [provider]  citalopram (CELEXA) 10 MG tablet Take 10 mg by mouth daily.    [provider]  diclofenac Sodium (VOLTAREN) 1 % GEL Apply 2 g topically 4 (four) times daily as needed (pain).    [provider]  furosemide (LASIX) 40 MG tablet Take 40 mg by mouth daily as needed. 10/04/20   [provider]  gabapentin (NEURONTIN) 100 MG capsule Take 100 mg by mouth 3 (three) times daily.    [provider]  lisinopril (ZESTRIL) 10 MG tablet Take 10 mg by mouth daily. 10/04/20   [provider]  losartan (COZAAR) 25 MG tablet Take 25 mg by mouth daily. 06/19/20   [provider]  Multiple Vitamin (MULTIVITAMIN WITH MINERALS) TABS Take 1 tablet by mouth daily. 10/05/12   Angiulli, Lavon Paganini, PA-C  zolpidem (AMBIEN) 10 MG tablet Take 5 mg by mouth at bedtime as needed. 08/16/20   [provider]    Allergies    Patient has no known  allergies.  Review of Systems   Review of Systems  Constitutional:  Negative for chills and fever.  HENT:  Negative for ear pain and sore throat.   Eyes:  Negative for pain and visual disturbance.  Respiratory:  Negative for cough and shortness of breath.   Cardiovascular:  Negative for chest pain and palpitations.  Gastrointestinal:  Negative for abdominal pain and vomiting.  Genitourinary:  Negative for dysuria and hematuria.  Musculoskeletal:  Positive for gait problem. Negative for arthralgias and back pain.       Right sided hip pain  Skin:  Negative for color change and rash.  Neurological:  Negative for seizures and syncope.  All other systems reviewed and are negative.  Physical Exam Updated Vital Signs BP 137/76    Pulse 94    Temp (!) 97.1 F (36.2 C)    Resp 16    Ht 5\' 7"  (1.702 m)    Wt 83 kg    SpO2 98%    BMI 28.66 kg/m   Physical Exam Vitals and nursing note reviewed.  Constitutional:      General: She is not in acute distress.    Appearance: She is well-developed.  HENT:     Head: Normocephalic and atraumatic.  Eyes:     Conjunctiva/sclera: Conjunctivae normal.  Cardiovascular:     Rate and Rhythm: Normal rate and regular rhythm.     Pulses:          Dorsalis pedis pulses are 2+ on the right side and 2+ on the left side.     Heart sounds: No murmur heard. Pulmonary:     Effort: Pulmonary effort is normal. No respiratory distress.     Breath sounds: Normal breath sounds.  Abdominal:     Palpations: Abdomen is soft.     Tenderness: There is no abdominal tenderness.  Musculoskeletal:        General: No swelling.     Cervical back: Neck supple.     Right hip: Bony tenderness present. No deformity.     Left hip: No deformity or bony tenderness.     Right upper leg: Normal.     Left upper leg: Normal.     Right knee: Normal.     Left knee: Normal.     Right lower leg: Normal.     Left lower leg: Normal.       Legs:  Skin:    General: Skin is warm  and dry.  Capillary Refill: Capillary refill takes less than 2 seconds.     Coloration: Skin is not pale.     Findings: No bruising.  Neurological:     Mental Status: She is alert.     GCS: GCS eye subscore is 4. GCS verbal subscore is 5. GCS motor subscore is 6.     Sensory: Sensation is intact.     Motor: Motor function is intact.  Psychiatric:        Mood and Affect: Mood normal.    ED Results / Procedures / Treatments   Labs (all labs ordered are listed, but only abnormal results are displayed) Labs Reviewed - No data to display  EKG None  Radiology No results found.  Procedures Procedures   Medications Ordered in ED Medications - No data to display  ED Course  I have reviewed the triage vital signs and the nursing notes.  Pertinent labs & imaging results that were available during my care of the patient were reviewed by me and considered in my medical decision making (see chart for details).    MDM Rules/Calculators/A&P                         1:59 PM 70 yo female presenting for right sided hip pain after mechanical fall. No ecchymosis or visible wounds on exam. Tenderness to palpation of right hip. Pt neurovascularly intact. Pt declining pain medication at this time. Xray demonstrates no fractures or dislocations. CT demonstrates no acute fractures.   Patient in no distress and overall condition improved here in the ED. Detailed discussions were had with the patient regarding current findings, and need for close f/u with orthopedic surgery. The patient has been instructed to return immediately if the symptoms worsen in any way for re-evaluation. Patient verbalized understanding and is in agreement with current care plan. All questions answered prior to discharge.     Final Clinical Impression(s) / ED Diagnoses Final diagnoses:  Fall, initial encounter  Hip pain    Rx / DC Orders ED Discharge Orders     None        Lianne Cure, DO [id number]  [id number]

## 2021-03-19 NOTE — ED Triage Notes (Signed)
Fall 2 days ago , fell backward , right hip injury and pain , Hx surgery on right hip .

## 2021-03-19 NOTE — ED Notes (Signed)
Patient transported to CT 

## 2021-03-19 NOTE — ED Notes (Signed)
Dc instructions reviewed with patient. Patient voiced understanding. Dc with belongings.  °

## 2021-03-19 NOTE — ED Notes (Signed)
Patient transported to X-ray 

## 2021-04-03 ENCOUNTER — Ambulatory Visit (INDEPENDENT_AMBULATORY_CARE_PROVIDER_SITE_OTHER): Payer: Medicare Other | Admitting: Pulmonary Disease

## 2021-04-03 ENCOUNTER — Encounter: Payer: Self-pay | Admitting: Pulmonary Disease

## 2021-04-03 ENCOUNTER — Other Ambulatory Visit: Payer: Self-pay

## 2021-04-03 VITALS — BP 118/64 | HR 85 | Temp 98.4°F | Ht 65.0 in | Wt 182.0 lb

## 2021-04-03 DIAGNOSIS — R0609 Other forms of dyspnea: Secondary | ICD-10-CM | POA: Diagnosis not present

## 2021-04-03 DIAGNOSIS — J439 Emphysema, unspecified: Secondary | ICD-10-CM | POA: Diagnosis not present

## 2021-04-03 NOTE — Patient Instructions (Signed)
Nice to see you again  I am sorry the inhalers have not helped.  I will send a referral to the heart doctors so they can evaluate causes of shortness of breath that could be improved from their standpoint.

## 2021-04-03 NOTE — Progress Notes (Signed)
@Patient  ID: Sophia Rodriguez, female    DOB: 02-10-1951, 71 y.o.   MRN: [id number]  Chief Complaint  Patient presents with   Follow-up    Follow up? She said she fell on christmas day and still has pain in her bottom. She states her breathing is not good either, she get SOB when she walks. Not sure if albuterol is working     Referring provider: Nolene Ebbs, MD  HPI:   71 year old whom we are seeing for follow up of dyspnea on exertion.  Patient returns for follow-up.  Symptoms unchanged, no worse, no better.  Has tried and failed ICS/LABA/LAMA, triple inhaled therapy.  Albuterol has not been helpful.  PFTs were concerning for ILD.  This was ruled out on high-res CT scan but did confirm emphysema.  No pulmonary interventions have been helpful in terms of her dyspnea.   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  This did not inhibit ability to obtain history.  She has dyspnea on exertion for some months worse in the last 2 or 3 months.  Symptoms worse with exertion.  Worse with inclines or stairs.  Can be present on flat surface as well.  No positional changes that make things worse, when she rests or lies down symptoms are better.  No environmental or seasonal changes that make things better or worse.  She does endorse significant seasonal allergies and nasal congestion.  This has been a little bit worse with the pollen season of late.  No other alleviating or exacerbating factors.  She does note that she had been a bit less active over the last several months.  This is somewhat due to her shortness of breath also due to joint issues.  She had planned to have hip replacement recently but this was delayed given her worsening shortness of breath symptoms.  Most recent chest x-ray as I can view 12/2012 reveals clear lungs bilaterally without infiltrate or effusion.  She had a chest x-ray that can be viewed in Care  Everywhere 05/2020 report reviewed which reveals "no acute cardiopulmonary disease."  TTE 04/2020 reviewed which revealed normal EF, grade 1 diastolic dysfunction, otherwise reassuring.  PMH: Hyperlipidemia, seasonal allergies, hypertension Surgical history: Hip arthroplasty on the right Family history: Father with CVA, brother with CVA Social history: Former smoker, 10 pack-year history, quit in the 1980s per her report  Questionaires / Pulmonary Flowsheets:   ACT:  No flowsheet data found.  MMRC: No flowsheet data found.  Epworth:  No flowsheet data found.  Tests:   FENO:  No results found for: NITRICOXIDE  PFT: PFT Results Latest Ref Rng & Units 08/06/2020  FVC-Pre L 1.78  FVC-Predicted Pre % 69  FVC-Post L 1.53  FVC-Predicted Post % 60  Pre FEV1/FVC % % 77  Post FEV1/FCV % % 75  FEV1-Pre L 1.37  FEV1-Predicted Pre % 69  FEV1-Post L 1.15  DLCO uncorrected ml/min/mmHg 9.31  DLCO UNC% % 45  DLCO corrected ml/min/mmHg 9.91  DLCO COR %Predicted % 48  DLVA Predicted % 74  TLC L 3.58  TLC % Predicted % 68  RV % Predicted % 72  Personally reviewed and interpreted as spirometry suggestive of moderate restriction versus gas trapping.  TLC confirms moderate restriction.  DLCO is severely reduced.  WALK:  No flowsheet data found.  Imaging: Personally reviewed and as per EMR discussion this note  Lab Results: Personally reviewed, no significant elevation  in eosinophils, hemoglobin stable slightly low at 11.6 CBC    Component Value Date/Time   WBC 4.6 06/27/2020 0835   RBC 4.42 06/27/2020 0835   HGB 11.6 (L) 06/27/2020 0835   HCT 37.8 06/27/2020 0835   PLT 213 06/27/2020 0835   MCV 85.5 06/27/2020 0835   MCH 26.2 06/27/2020 0835   MCHC 30.7 06/27/2020 0835   RDW 14.0 06/27/2020 0835   LYMPHSABS 1.8 12/01/2014 1802   MONOABS 0.4 12/01/2014 1802   EOSABS 0.2 12/01/2014 1802   BASOSABS 0.0 12/01/2014 1802    BMET    Component Value Date/Time   NA 139  06/27/2020 0835   K 4.4 06/27/2020 0835   CL 105 06/27/2020 0835   CO2 25 06/27/2020 0835   GLUCOSE 122 (H) 06/27/2020 0835   BUN 10 06/27/2020 0835   CREATININE 1.23 (H) 06/27/2020 0835   CREATININE 1.89 (H) 04/26/2020 1014   CALCIUM 9.6 06/27/2020 0835   GFRNONAA 48 (L) 06/27/2020 0835   GFRNONAA 27 (L) 04/26/2020 1014   GFRAA 31 (L) 04/26/2020 1014    BNP No results found for: BNP  ProBNP No results found for: PROBNP  Specialty Problems   None  No Known Allergies  Immunization History  Administered Date(s) Administered   PFIZER(Purple Top)SARS-COV-2 Vaccination 05/07/2019, 06/01/2019, 01/03/2020    Past Medical History:  Diagnosis Date   Anxiety    Arthritis    Chronic kidney disease    related to blood pressure meds- pt reports being taken off of meds    Depression    Diabetes mellitus    hx of not on meds, not on meds in over a year    Dyspnea    Edema    in legs per pt    Headache    occ headache behind eyes    Hypertension    Stroke ([place])    x 3 weakness on right side     Tobacco History: Social History   Tobacco Use  Smoking Status Former   Types: Cigarettes   Start date: 1971   Quit date: 05/28/2007   Years since quitting: 13.8  Smokeless Tobacco Never   Counseling given: Not Answered   Continue to not smoke  Outpatient Encounter Medications as of 04/03/2021  Medication Sig   alendronate (FOSAMAX) 70 MG tablet Take 70 mg by mouth once a week.   aspirin 325 MG tablet Take 1 tablet (325 mg total) by mouth daily.   atorvastatin (LIPITOR) 40 MG tablet Take 1 tablet (40 mg total) by mouth daily at 6 PM.   cetirizine (ZYRTEC) 10 MG tablet Take 10 mg by mouth daily as needed for allergies.   citalopram (CELEXA) 10 MG tablet Take 10 mg by mouth daily.   escitalopram (LEXAPRO) 10 MG tablet Take 10 mg by mouth every evening.   gabapentin (NEURONTIN) 100 MG capsule Take 100 mg by mouth 3 (three) times daily.   HYDROcodone-acetaminophen (NORCO)  5-325 MG tablet Take 1 tablet by mouth every 6 (six) hours as needed for moderate pain.   losartan (COZAAR) 25 MG tablet Take 25 mg by mouth daily.   Multiple Vitamin (MULTIVITAMIN WITH MINERALS) TABS Take 1 tablet by mouth daily.   zolpidem (AMBIEN) 10 MG tablet Take 5 mg by mouth at bedtime as needed.   [DISCONTINUED] albuterol (VENTOLIN HFA) 108 (90 Base) MCG/ACT inhaler Inhale 2 puffs into the lungs every 6 (six) hours as needed for wheezing or shortness of breath.   No facility-administered encounter medications  on file as of 04/03/2021.     Review of Systems  Review of Systems  N/a Physical Exam  BP 118/64 (BP Location: Left Arm, Patient Position: Sitting, Cuff Size: Normal)    Pulse 85    Temp 98.4 F (36.9 C) (Oral)    Ht 5\' 5"  (1.651 m)    Wt 182 lb (82.6 kg)    SpO2 98%    BMI 30.29 kg/m   Wt Readings from Last 5 Encounters:  04/03/21 182 lb (82.6 kg)  03/19/21 183 lb (83 kg)  01/15/21 188 lb 9.6 oz (85.5 kg)  10/16/20 187 lb (84.8 kg)  07/27/20 186 lb 9.6 oz (84.6 kg)    BMI Readings from Last 5 Encounters:  04/03/21 30.29 kg/m  03/19/21 28.66 kg/m  01/15/21 31.38 kg/m  10/16/20 31.12 kg/m  07/27/20 31.05 kg/m     Physical Exam General: Frail, in no distress Eyes: EOMI, no icterus Neck: Supple no JVP Cardiovascular: Regular rhythm, no murmur Pulmonary: Clear to auscultation bilaterally, normal work of breathing Abdomen: Nondistended, bowel sounds present  MSK: No synovitis, joint  neuro: Normal gait, slow to find words at times, no weakness Psych: Normal mood, flat affect   Assessment & Plan:   DOE: Suspect multifactorial but concern for deconditioning playing a major role.  In setting of MSK issues and difficulty ambulating.  PFTs with restriction and reduced DLCO.  No ILD on high-res CT scan, emphysema present.  Suspect emphysema contributing.  Did not respond well to Advair.  Escalated to Trelegy 7/22 without response.  Trialed as needed  bronchodilators fall 2022 without improvement.  Referral to cardiology today.    Emphysema: Related to history of cigarette smoking.  Contributor symptoms.  Not responding to inhalers.  Suspect deconditioning major issue.     Return in about 6 months (around 10/01/2021).   Lanier Clam, MD 04/03/2021

## 2021-04-26 ENCOUNTER — Ambulatory Visit: Payer: Medicare Other | Admitting: Internal Medicine

## 2021-04-30 ENCOUNTER — Ambulatory Visit: Payer: Medicare Other | Admitting: Internal Medicine

## 2021-05-20 ENCOUNTER — Encounter: Payer: Self-pay | Admitting: Internal Medicine

## 2021-05-20 ENCOUNTER — Other Ambulatory Visit: Payer: Self-pay

## 2021-05-20 ENCOUNTER — Ambulatory Visit (INDEPENDENT_AMBULATORY_CARE_PROVIDER_SITE_OTHER): Payer: Medicare Other | Admitting: Internal Medicine

## 2021-05-20 VITALS — BP 123/73 | HR 96 | Ht 64.5 in | Wt 186.4 lb

## 2021-05-20 DIAGNOSIS — R0609 Other forms of dyspnea: Secondary | ICD-10-CM | POA: Diagnosis not present

## 2021-05-20 DIAGNOSIS — Z79899 Other long term (current) drug therapy: Secondary | ICD-10-CM

## 2021-05-20 MED ORDER — FUROSEMIDE 20 MG PO TABS: 20.0000 mg | ORAL_TABLET | Freq: Every day | ORAL | 3 refills | Status: DC

## 2021-05-20 NOTE — Patient Instructions (Addendum)
Medication Instructions:  PLEASE TAKE LASIX 20mg  ONCE DAILY  *If you need a refill on your cardiac medications before your next appointment, please call your pharmacy*  Lab Work: BLOOD WORK TODAY   And then  Please return for Blood Work in 2 WEEKS. No appointment needed, lab here at the office is open Monday-Friday from 8AM to 4PM and closed daily for lunch from 12:45-1:45.   If you have labs (blood work) drawn today and your tests are completely normal, you will receive your results only by: MyChart Message (if you have MyChart) OR A paper copy in the mail If you have any lab test that is abnormal or we need to change your treatment, we will call you to review the results.  Testing/Procedures: Your physician has requested that you have a lexiscan myoview. For further information please visit . Please follow instruction sheet, as given. This will take place at [email or web address]   Follow-Up: At [facility], you and your health needs are our priority.  As part of our continuing mission to provide you with exceptional heart care, we have created designated Provider Care Teams.  These Care Teams include your primary Cardiologist (physician) and Advanced Practice Providers (APPs -  Physician Assistants and Nurse Practitioners) who all work together to provide you with the care you need, when you need it.  Your next appointment:   3 month(s)  The format for your next appointment:   In Person  Provider:   [employer], MD

## 2021-05-20 NOTE — Progress Notes (Signed)
Cardiology Office Note:    Date:  05/20/2021   ID:  Sophia Rodriguez, DOB [birth year], MRN [id number]  PCP:  Fleet Contras, MD   [facility] Providers Cardiologist:  Maisie Fus, MD     Referring MD: Karren Burly, MD   No chief complaint on file. Shortness of breath  History of Present Illness:    Sophia Rodriguez is a 71 y.o. 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 She was seen by pulmonology, Dr. [name]. Her CT scan did not show signs of fibrosis on CT. Showed signs of emphysema but not responding well to bronchodilators. Theres a concern for extrinsic cause of restriction Pending thyroid nodule biopsy. Being considered for hip sx.  She notes dyspnea about two years ago. She notes some feet swelling.  She can't ambulate in the grocery story. She started wearing compression stockings. She sleeps with a wedge to sleep with. She denies PND. No hx of MI. No history of a stress test. She has hx of strokes; the first one was 10 years ago. She then had two more. She was told that the contributing factors were hypertension and diabetes. No hx of arrhythmia. She was visiting in [place], [place].   She took lasix consistently before, no takes as needed. The lasix does not help with the shortness of breath or swelling. Her hgb 11.6. 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 Her blood pressure is normal.   Past Medical History:  Diagnosis Date   Anxiety    Arthritis    Chronic kidney disease    related to blood pressure meds- pt reports being taken off of meds    Depression    Diabetes mellitus    hx of not on meds, not on meds in over a year    Dyspnea    Edema    in legs per pt    Headache    occ headache behind eyes    Hypertension    Stroke (HCC)    x 3 weakness on right side     Past Surgical History:  Procedure Laterality Date    HIP ARTHROPLASTY Right 11/24/2012   Procedure: RIGHT HIP HEMIARTHROPLASTY;  Surgeon: Verlee Rossetti, MD;  Location: [facility];  Service: Orthopedics;  Laterality: Right;  hemiatroplasty, DePuy Triloc    Current Medications: Current Meds  Medication Sig   alendronate (FOSAMAX) 70 MG tablet Take 70 mg by mouth once a week.   aspirin 325 MG tablet Take 1 tablet (325 mg total) by mouth daily.   atorvastatin (LIPITOR) 40 MG tablet Take 1 tablet (40 mg total) by mouth daily at 6 PM.   cetirizine (ZYRTEC) 10 MG tablet Take 10 mg by mouth daily as needed for allergies.   citalopram (CELEXA) 10 MG tablet Take 10 mg by mouth daily.   furosemide (LASIX) 20 MG tablet Take 1 tablet (20 mg total) by mouth daily.   gabapentin (NEURONTIN) 100 MG capsule Take 100 mg by mouth 3 (three) times daily.   losartan (COZAAR) 25 MG tablet Take 25 mg by mouth daily.   Multiple Vitamin (MULTIVITAMIN WITH MINERALS) TABS Take 1 tablet by mouth daily.   zolpidem (AMBIEN) 10 MG tablet Take 5 mg by mouth at bedtime as needed.   [DISCONTINUED] escitalopram (LEXAPRO) 10 MG tablet Take 10 mg by mouth every evening.   [  DISCONTINUED] HYDROcodone-acetaminophen (NORCO) 5-325 MG tablet Take 1 tablet by mouth every 6 (six) hours as needed for moderate pain.     Allergies:   Patient has no known allergies.   Social History   Socioeconomic History   Marital status: Single    Spouse name: Not on file   Number of children: 2   Years of education: Grad school   Highest education level: Not on file  Occupational History   Not on file  Tobacco Use   Smoking status: Former    Types: Cigarettes    Start date: 56    Quit date: 05/28/2007    Years since quitting: 13.9   Smokeless tobacco: Never  Vaping Use   Vaping Use: Never used  Substance and Sexual Activity   Alcohol use: No   Drug use: No   Sexual activity: Not on file  Other Topics Concern   Not on file  Social History Narrative   Lives at home by herself.     Daughter come to help at home and she has a cleaning lady to help twice a week.    Caffeine: Every morning 1-1.5 cups coffee/day, and 12oz tea/day    Right-handed   Social Determinants of Health   Financial Resource Strain: Not on file  Food Insecurity: Not on file  Transportation Needs: Not on file  Physical Activity: Not on file  Stress: Not on file  Social Connections: Not on file     Family History: The patient's family history includes Stroke in her brother and father.  ROS:   Please see the history of present illness.     All other systems reviewed and are negative.  EKGs/Labs/Other Studies Reviewed:    The following studies were reviewed today:   EKG:  EKG is  ordered today.  The ekg ordered today demonstrates   Normal sinus rhythm, low voltage  Recent Labs: 06/27/2020: BUN 10; Creatinine, Ser 1.23; Hemoglobin 11.6; Platelets 213; Potassium 4.4; Sodium 139  Recent Lipid Panel    Component Value Date/Time   CHOL 195 08/27/2012 0530   TRIG 120 08/27/2012 0530   HDL 43 08/27/2012 0530   CHOLHDL 4.5 08/27/2012 0530   VLDL 24 08/27/2012 0530   LDLCALC 128 (H) 08/27/2012 0530     Risk Assessment/Calculations:           Physical Exam:    VS:    Vitals:   05/20/21 1534  BP: 123/73  Pulse: 96  SpO2: 97%     Wt Readings from Last 3 Encounters:  05/20/21 186 lb 6.4 oz (84.6 kg)  04/03/21 182 lb (82.6 kg)  03/19/21 183 lb (83 kg)     GEN:  Well nourished, well developed in no acute distress. Sitting in a wheel chair HEENT: Normal NECK: no sig JVD at 90 degrees, No carotid bruits LYMPHATICS: No lymphadenopathy CARDIAC: RRR, no murmurs, rubs, gallops RESPIRATORY:  Clear to auscultation without rales, wheezing or rhonchi  ABDOMEN: Soft, non-tender, non-distended MUSCULOSKELETAL:  1+ BL edema; No deformity  SKIN: Warm and dry NEUROLOGIC:  Alert and oriented x 3 PSYCHIATRIC:  Normal affect   ASSESSMENT:    #Dyspnea/Pre-op:  Hfpef is on the  differential.  However, her echo does not show  evidence for elevated LVEDP (no LVH, no LA enlargement, E/e' < 14) She does have ?orthopnea and some LE swelling.  She can't travel far w/o dyspnea, may have elevated filling pressures with activity. She's had no hx of MI or admission for  decompensation. Her EKG has low voltage but no significant septal thickness, not c/f amyloid. She is being worked up for hip sx; since she cannot perform 4 METS will plan for lexiscan spect. If this does not show inducible ischemia, she is acceptable cardiac risk for hip surgery. Otherwise, we also discussed taking lasix daily to see if this relieves symptoms.   PLAN:    In order of problems listed above:  Lexiscan SPECT BNP Lasix 20 mg BMP 2 weeks Follow up 3 months      Shared Decision Making/Informed Consent The risks [chest pain, shortness of breath, cardiac arrhythmias, dizziness, blood pressure fluctuations, myocardial infarction, stroke/transient ischemic attack, nausea, vomiting, allergic reaction, radiation exposure, metallic taste sensation and life-threatening complications (estimated to be 1 in 10,000)], benefits (risk stratification, diagnosing coronary artery disease, treatment guidance) and alternatives of a nuclear stress test were discussed in detail with Ms. Crunk and she agrees to proceed.    Medication Adjustments/Labs and Tests Ordered: Current medicines are reviewed at length with the patient today.  Concerns regarding medicines are outlined above.  Orders Placed This Encounter  Procedures   Brain natriuretic peptide   Basic metabolic panel   MYOCARDIAL PERFUSION IMAGING   EKG 12-Lead   Meds ordered this encounter  Medications   furosemide (LASIX) 20 MG tablet    Sig: Take 1 tablet (20 mg total) by mouth daily.    Dispense:  90 tablet    Refill:  3    Patient Instructions  Medication Instructions:  PLEASE TAKE LASIX 20mg  ONCE DAILY  *If you need a refill on your cardiac  medications before your next appointment, please call your pharmacy*  Lab Work: BLOOD WORK TODAY   And then  Please return for Blood Work in 2 WEEKS. No appointment needed, lab here at the office is open Monday-Friday from 8AM to 4PM and closed daily for lunch from 12:45-1:45.   If you have labs (blood work) drawn today and your tests are completely normal, you will receive your results only by: MyChart Message (if you have MyChart) OR A paper copy in the mail If you have any lab test that is abnormal or we need to change your treatment, we will call you to review the results.  Testing/Procedures: Your physician has requested that you have a lexiscan myoview. For further information please visit . Please follow instruction sheet, as given. This will take place at [email or web address]   Follow-Up: At [facility], you and your health needs are our priority.  As part of our continuing mission to provide you with exceptional heart care, we have created designated Provider Care Teams.  These Care Teams include your primary Cardiologist (physician) and Advanced Practice Providers (APPs -  Physician Assistants and Nurse Practitioners) who all work together to provide you with the care you need, when you need it.  Your next appointment:   3 month(s)  The format for your next appointment:   In Person  Provider:   [employer], MD     Signed, Maisie Fus, MD  05/20/2021 4:52 PM    [facility]

## 2021-05-21 LAB — BRAIN NATRIURETIC PEPTIDE: BNP: 18.8 pg/mL (ref 0.0–100.0)

## 2021-05-23 ENCOUNTER — Telehealth (HOSPITAL_COMMUNITY): Payer: Self-pay

## 2021-05-23 NOTE — Telephone Encounter (Signed)
Spoke with the patient, detailed instructions given. She stated that she understood and would be here for her test. S.Karlye Ihrig EMTP 

## 2021-05-28 ENCOUNTER — Ambulatory Visit (HOSPITAL_COMMUNITY): Payer: Medicare Other | Attending: Cardiovascular Disease

## 2021-05-28 ENCOUNTER — Other Ambulatory Visit: Payer: Self-pay

## 2021-05-28 DIAGNOSIS — R0609 Other forms of dyspnea: Secondary | ICD-10-CM | POA: Insufficient documentation

## 2021-05-28 LAB — MYOCARDIAL PERFUSION IMAGING
LV dias vol: 39 mL (ref 46–106)
LV sys vol: 11 mL
Nuc Stress EF: 71 %
Peak HR: 100 {beats}/min
Rest HR: 68 {beats}/min
Rest Nuclear Isotope Dose: 10.4 mCi
SDS: 8
SRS: 0
SSS: 8
ST Depression (mm): 0 mm
Stress Nuclear Isotope Dose: 31.2 mCi
TID: 0.86

## 2021-05-28 MED ORDER — TECHNETIUM TC 99M TETROFOSMIN IV KIT
31.2000 | PACK | Freq: Once | INTRAVENOUS | Status: AC | PRN
  Administered 2021-05-28: 31.2 via INTRAVENOUS
  Filled 2021-05-28: qty 32

## 2021-05-28 MED ORDER — REGADENOSON 0.4 MG/5ML IV SOLN
0.4000 mg | Freq: Once | INTRAVENOUS | Status: AC
  Administered 2021-05-28: 0.4 mg via INTRAVENOUS

## 2021-05-28 MED ORDER — TECHNETIUM TC 99M TETROFOSMIN IV KIT
10.4000 | PACK | Freq: Once | INTRAVENOUS | Status: AC | PRN
  Administered 2021-05-28: 10.4 via INTRAVENOUS
  Filled 2021-05-28: qty 11

## 2021-06-21 ENCOUNTER — Other Ambulatory Visit: Payer: Self-pay

## 2021-06-21 DIAGNOSIS — R0609 Other forms of dyspnea: Secondary | ICD-10-CM

## 2021-06-21 DIAGNOSIS — Z79899 Other long term (current) drug therapy: Secondary | ICD-10-CM

## 2021-06-22 LAB — BASIC METABOLIC PANEL
BUN/Creatinine Ratio: 8 — ABNORMAL LOW (ref 12–28)
BUN: 10 mg/dL (ref 8–27)
CO2: 23 mmol/L (ref 20–29)
Calcium: 9.9 mg/dL (ref 8.7–10.3)
Chloride: 103 mmol/L (ref 96–106)
Creatinine, Ser: 1.24 mg/dL — ABNORMAL HIGH (ref 0.57–1.00)
Glucose: 93 mg/dL (ref 70–99)
Potassium: 4.5 mmol/L (ref 3.5–5.2)
Sodium: 142 mmol/L (ref 134–144)
eGFR: 47 mL/min/{1.73_m2} — ABNORMAL LOW (ref >59)

## 2021-06-25 ENCOUNTER — Ambulatory Visit (INDEPENDENT_AMBULATORY_CARE_PROVIDER_SITE_OTHER): Payer: Medicare Other | Admitting: Podiatry

## 2021-06-25 ENCOUNTER — Encounter: Payer: Self-pay | Admitting: Podiatry

## 2021-06-25 DIAGNOSIS — M79675 Pain in left toe(s): Secondary | ICD-10-CM | POA: Diagnosis not present

## 2021-06-25 DIAGNOSIS — M79674 Pain in right toe(s): Secondary | ICD-10-CM | POA: Diagnosis not present

## 2021-06-25 DIAGNOSIS — B351 Tinea unguium: Secondary | ICD-10-CM | POA: Diagnosis not present

## 2021-06-25 DIAGNOSIS — E1149 Type 2 diabetes mellitus with other diabetic neurological complication: Secondary | ICD-10-CM

## 2021-06-25 NOTE — Progress Notes (Signed)
?  Subjective:  ?Patient ID: Sophia Rodriguez, female    DOB: [birth year],   MRN: [id number] ? ?Chief Complaint  ?Patient presents with  ? Nail Problem  ?   ?Diabetic nail trim  ? ? ?71 y.o. female presents for concern of thickened elongated and painful nails that are difficult to trim. Requesting to have them trimmed today. Denies any burning or tingling in her feet. Patient is diabetic and last A1c was 6.8.  ? . Denies any other pedal complaints. Denies n/v/f/c.  ? ?PCP: Fleet Contras MD  ? ?Past Medical History:  ?Diagnosis Date  ? Anxiety   ? Arthritis   ? Chronic kidney disease   ? related to blood pressure meds- pt reports being taken off of meds   ? Depression   ? Diabetes mellitus   ? hx of not on meds, not on meds in over a year   ? Dyspnea   ? Edema   ? in legs per pt   ? Headache   ? occ headache behind eyes   ? Hypertension   ? Stroke [facility])   ? x 3 weakness on right side   ? ? ?Objective:  ?Physical Exam: ?Vascular: DP/PT pulses 2/4 bilateral. CFT <3 seconds. Absent hair growth on digits. Edema noted to bilateral lower extremities. Xerosis noted bilaterally.  ?Skin. No lacerations or abrasions bilateral feet. Nails 1-5 bilateral  are thickened discolored and elongated with subungual debris.  ?Musculoskeletal: MMT 5/5 bilateral lower extremities in DF, PF, Inversion and Eversion. Deceased ROM in DF of ankle joint.  ?Neurological: Sensation intact to light touch. Protective sensation diminished bilateral.  ? ? ?Assessment:  ?No diagnosis found. ? ? ?Plan:  ?Patient was evaluated and treated and all questions answered. ?-Discussed and educated patient on diabetic foot care, especially with  ?regards to the vascular, neurological and musculoskeletal systems.  ?-Stressed the importance of good glycemic control and the detriment of not  ?controlling glucose levels in relation to the foot. ?-Discussed supportive shoes at all times and checking feet regularly.  ?-Mechanically debrided all nails 1-5 bilateral using  sterile nail nipper and filed with dremel without incident  ?-Answered all patient questions ?-Patient to return  in 3 months for at risk foot care ?-Patient advised to call the office if any problems or questions arise in the meantime. ? ? ?Louann Sjogren, DPM  ? ? ?

## 2021-08-26 ENCOUNTER — Telehealth: Payer: Self-pay

## 2021-08-26 NOTE — Telephone Encounter (Signed)
   Pre-operative Risk Assessment    Patient Name: Sophia Rodriguez  DOB: 1951/01/31 MRN: [id number]      Request for Surgical Clearance    Procedure:   Conversion of herniarthroplasty to total hip arthroplasty  Date of Surgery:  Clearance 11/07/21                                 Surgeon:  Elliot Cousin, MD   Surgeon's Group or Practice Name:  [facility] Phone number:  [phone] Fax number:  [phone]   Type of Clearance Requested:   - Medical    Type of Anesthesia:  Spinal   Additional requests/questions:    SignedWonda Horner   08/26/2021, 12:19 PM

## 2021-08-27 NOTE — Telephone Encounter (Signed)
  Patient Consent for Virtual Visit        Sophia Rodriguez has provided verbal consent on 08/27/2021 for a virtual visit (video or telephone).   CONSENT FOR VIRTUAL VISIT FOR:  Sophia Rodriguez  By participating in this virtual visit I agree to the following:  I hereby voluntarily request, consent and authorize [facility] and its employed or contracted physicians, Producer, television/film/video, nurse practitioners or other licensed health care professionals (the Practitioner), to provide me with telemedicine health care services (the "Services") as deemed necessary by the treating Practitioner. I acknowledge and consent to receive the Services by the Practitioner via telemedicine. I understand that the telemedicine visit will involve communicating with the Practitioner through live audiovisual communication technology and the disclosure of certain medical information by electronic transmission. I acknowledge that I have been given the opportunity to request an in-person assessment or other available alternative prior to the telemedicine visit and am voluntarily participating in the telemedicine visit.  I understand that I have the right to withhold or withdraw my consent to the use of telemedicine in the course of my care at any time, without affecting my right to future care or treatment, and that the Practitioner or I may terminate the telemedicine visit at any time. I understand that I have the right to inspect all information obtained and/or recorded in the course of the telemedicine visit and may receive copies of available information for a reasonable fee.  I understand that some of the potential risks of receiving the Services via telemedicine include:  Delay or interruption in medical evaluation due to technological equipment failure or disruption; Information transmitted may not be sufficient (e.g. poor resolution of images) to allow for appropriate medical decision making by the Practitioner; and/or   In rare instances, security protocols could fail, causing a breach of personal health information.  Furthermore, I acknowledge that it is my responsibility to provide information about my medical history, conditions and care that is complete and accurate to the best of my ability. I acknowledge that Practitioner's advice, recommendations, and/or decision may be based on factors not within their control, such as incomplete or inaccurate data provided by me or distortions of diagnostic images or specimens that may result from electronic transmissions. I understand that the practice of medicine is not an exact science and that Practitioner makes no warranties or guarantees regarding treatment outcomes. I acknowledge that a copy of this consent can be made available to me via my patient portal [facility] MyChart), or I can request a printed copy by calling the office of [facility].    I understand that my insurance will be billed for this visit.   I have read or had this consent read to me. I understand the contents of this consent, which adequately explains the benefits and risks of the Services being provided via telemedicine.  I have been provided ample opportunity to ask questions regarding this consent and the Services and have had my questions answered to my satisfaction. I give my informed consent for the services to be provided through the use of telemedicine in my medical care

## 2021-08-27 NOTE — Telephone Encounter (Signed)
Spoke with patient who is agreeable to do a pre-op tele visit on 7/10 at 9:40 am. Med rec and consent are done.   I will fax recommendations to requesting surgeon's office.

## 2021-08-27 NOTE — Telephone Encounter (Signed)
    Name: Sophia Rodriguez  DOB: 10-Jan-1951  MRN: [id number]  Primary Cardiologist: Maisie Fus, MD   Preoperative team, please contact this patient and set up a phone call appointment for further preoperative risk assessment. Please obtain consent and complete medication review. Thank you for your help.  I confirm that guidance regarding antiplatelet and oral anticoagulation therapy has been completed and, if necessary, noted below.    [place], [place] 08/27/2021, 12:03 PM [facility] [address] [place], [place] [address]

## 2021-09-05 ENCOUNTER — Telehealth: Payer: Self-pay | Admitting: Pulmonary Disease

## 2021-09-05 DIAGNOSIS — R0609 Other forms of dyspnea: Secondary | ICD-10-CM

## 2021-09-05 DIAGNOSIS — J439 Emphysema, unspecified: Secondary | ICD-10-CM

## 2021-09-05 NOTE — Telephone Encounter (Signed)
Fax received from Dr. Durene Romans with [facility] to perform a Conversion of hemiarthroplasty to total hip arthroplasty on patient.  Patient needs surgery clearance. Patient was seen on 04/03/21. Office protocol is a risk assessment can be sent to surgeon if patient has been seen in 60 days or less.   Sending to Dr. [name] for risk assessment or recommendations if patient needs to be seen in office prior to surgical procedure.

## 2021-09-06 ENCOUNTER — Other Ambulatory Visit: Payer: Self-pay | Admitting: Surgery

## 2021-09-06 DIAGNOSIS — E042 Nontoxic multinodular goiter: Secondary | ICD-10-CM

## 2021-09-10 ENCOUNTER — Ambulatory Visit
Admission: RE | Admit: 2021-09-10 | Discharge: 2021-09-10 | Disposition: A | Discharge status: Home / Self Care | Payer: Medicare Other | Source: Ambulatory Visit | Attending: Surgery | Admitting: Surgery

## 2021-09-10 DIAGNOSIS — E042 Nontoxic multinodular goiter: Secondary | ICD-10-CM

## 2021-09-11 ENCOUNTER — Encounter: Payer: Self-pay | Admitting: Surgery

## 2021-09-19 NOTE — Telephone Encounter (Signed)
Please advise 

## 2021-09-19 NOTE — Telephone Encounter (Signed)
She has incomplete data.  Can we schedule a office visit with me in the next 2 to 4 weeks for preoperative evaluation?  We will need a CBC to evaluate hemoglobin level fully evaluate surgical risk.

## 2021-09-25 NOTE — Telephone Encounter (Signed)
Called and spoke with patient to get her scheduled for OV and to let her know that Dr. [name] would like her to get lab work done before her OV. She expressed understanding. Orders have been placed.

## 2021-09-30 ENCOUNTER — Encounter: Payer: Self-pay | Admitting: Nurse Practitioner

## 2021-09-30 ENCOUNTER — Ambulatory Visit (INDEPENDENT_AMBULATORY_CARE_PROVIDER_SITE_OTHER): Payer: Medicare Other | Admitting: Nurse Practitioner

## 2021-09-30 DIAGNOSIS — Z01810 Encounter for preprocedural cardiovascular examination: Secondary | ICD-10-CM | POA: Diagnosis not present

## 2021-09-30 NOTE — Progress Notes (Signed)
Virtual Visit via Telephone Note   Because of Sophia Rodriguez's co-morbid illnesses, she is at least at moderate risk for complications without adequate follow up.  This format is felt to be most appropriate for this patient at this time.  The patient did not have access to video technology/had technical difficulties with video requiring transitioning to audio format only (telephone).  All issues noted in this document were discussed and addressed.  No physical exam could be performed with this format.  Please refer to the patient's chart for her consent to telehealth for [facility].  Evaluation Performed:  Preoperative cardiovascular risk assessment _____________   Date:  09/30/2021   Patient ID:  Sophia Rodriguez, DOB [birth year], MRN [id number] Patient Location:  Home Provider location:   Office  Primary Care Provider:  Fleet Contras, MD Primary Cardiologist:  Maisie Fus, MD  Chief Complaint / Patient Profile   71 y.o. y/o female with a h/o hypertension, type 2 diabetes, CVAs, CKD, and dyspnea on exertion who is pending conversion of hemiarthroplasty to total hip arthroplasty and presents today for telephonic preoperative cardiovascular risk assessment.  Past Medical History    Past Medical History:  Diagnosis Date   Anxiety    Arthritis    Chronic kidney disease    related to blood pressure meds- pt reports being taken off of meds    Depression    Diabetes mellitus    hx of not on meds, not on meds in over a year    Dyspnea    Edema    in legs per pt    Headache    occ headache behind eyes    Hypertension    Stroke (HCC)    x 3 weakness on right side    Past Surgical History:  Procedure Laterality Date   HIP ARTHROPLASTY Right 11/24/2012   Procedure: RIGHT HIP HEMIARTHROPLASTY;  Surgeon: Verlee Rossetti, MD;  Location: [facility];  Service: Orthopedics;  Laterality: Right;  hemiatroplasty, DePuy Triloc    Allergies  No Known Allergies  History of Present  Illness    Sophia Rodriguez is a 71 y.o. female who presents via audio/video conferencing for a telehealth visit today.  Pt was last seen in cardiology clinic on 05/20/21 by Dr. Carolan Clines.  At that time Sophia Rodriguez reported dyspnea on exertion. Since she was not able to achieve 4 METS, Lexiscan Myoview was performed and revealed no evidence of ischemia, no evidence of infarction, normal end-diastolic pressure.  Per Dr. Wyline Mood she was at acceptable risk to proceed with surgery. The patient is now pending procedure as outlined above. She has residual right side weakness post CVA.  Since her last visit, she denies chest pain, shortness of breath, lower extremity edema, fatigue, palpitations, melena, hematuria, hemoptysis, diaphoresis, presyncope, syncope, orthopnea, and PND.  Home Medications    Prior to Admission medications   Medication Sig Start Date End Date Taking? Authorizing Provider  alendronate (FOSAMAX) 70 MG tablet Take 70 mg by mouth once a week. 07/11/20   [provider]  aspirin 325 MG tablet Take 1 tablet (325 mg total) by mouth daily. 10/05/12   Angiulli, Mcarthur Rossetti, PA-C  atorvastatin (LIPITOR) 40 MG tablet Take 1 tablet (40 mg total) by mouth daily at 6 PM. 10/05/12   Angiulli, Mcarthur Rossetti, PA-C  cetirizine (ZYRTEC) 10 MG tablet Take 10 mg by mouth daily as needed for allergies.    [provider]  citalopram (CELEXA) 10 MG tablet Take 10  mg by mouth daily.    [provider]  escitalopram (LEXAPRO) 20 MG tablet Take 20 mg by mouth daily. 05/16/21   [provider]  Furosemide (LASIX PO) Take 10 mg by mouth daily.    [provider]  furosemide (LASIX) 20 MG tablet Take 1 tablet (20 mg total) by mouth daily. Patient not taking: Reported on 08/27/2021 05/20/21   Maisie Fus, MD  gabapentin (NEURONTIN) 100 MG capsule Take 100 mg by mouth 3 (three) times daily.    [provider]  losartan (COZAAR) 25 MG tablet Take 25 mg by mouth daily.  06/19/20   [provider]  Multiple Vitamin (MULTIVITAMIN WITH MINERALS) TABS Take 1 tablet by mouth daily. 10/05/12   Angiulli, Mcarthur Rossetti, PA-C  zolpidem (AMBIEN) 10 MG tablet Take 5 mg by mouth at bedtime as needed. Patient not taking: Reported on 08/27/2021 08/16/20   [provider]    Physical Exam    Vital Signs:  Sophia Rodriguez does not have vital signs available for review today.  Given telephonic nature of communication, physical exam is limited. AAOx3. NAD. Normal affect.  Speech and respirations are unlabored.  Accessory Clinical Findings    None  Assessment & Plan    1.  Preoperative Cardiovascular Risk Assessment: There is no cardiac indication for high dose aspirin. She is doing well from a cardiac perspective and may proceed to surgery without further testing. According to the Revised Cardiac Risk Index (RCRI), her Perioperative Risk of Major Cardiac Event is (%): 6.6. Her Functional Capacity in METs is: 5.62 according to the Duke Activity Status Index (DASI).   A copy of this note will be routed to requesting surgeon.  Time:   Today, I have spent 10 minutes with the patient with telehealth technology discussing medical history, symptoms, and management plan.     Levi Aland, NP-C    09/30/2021, 9:50 AM [facility] [address] Office [phone] Fax [phone]

## 2021-10-01 ENCOUNTER — Ambulatory Visit: Payer: Medicare Other | Admitting: Podiatry

## 2021-10-01 ENCOUNTER — Other Ambulatory Visit (INDEPENDENT_AMBULATORY_CARE_PROVIDER_SITE_OTHER): Payer: Medicare Other

## 2021-10-01 DIAGNOSIS — R0609 Other forms of dyspnea: Secondary | ICD-10-CM

## 2021-10-01 DIAGNOSIS — J439 Emphysema, unspecified: Secondary | ICD-10-CM

## 2021-10-01 LAB — CBC WITH DIFFERENTIAL/PLATELET
Basophils Absolute: 0 10*3/uL (ref 0.0–0.1)
Basophils Relative: 0.4 % (ref 0.0–3.0)
Eosinophils Absolute: 0.2 10*3/uL (ref 0.0–0.7)
Eosinophils Relative: 4.6 % (ref 0.0–5.0)
HCT: 34 % — ABNORMAL LOW (ref 36.0–46.0)
Hemoglobin: 11.3 g/dL — ABNORMAL LOW (ref 12.0–15.0)
Lymphocytes Relative: 17.4 % (ref 12.0–46.0)
Lymphs Abs: 0.9 10*3/uL (ref 0.7–4.0)
MCHC: 33.3 g/dL (ref 30.0–36.0)
MCV: 79.4 fl (ref 78.0–100.0)
Monocytes Absolute: 0.5 10*3/uL (ref 0.1–1.0)
Monocytes Relative: 9.3 % (ref 3.0–12.0)
Neutro Abs: 3.6 10*3/uL (ref 1.4–7.7)
Neutrophils Relative %: 68.3 % (ref 43.0–77.0)
Platelets: 226 10*3/uL (ref 150.0–400.0)
RBC: 4.27 Mil/uL (ref 3.87–5.11)
RDW: 14.6 % (ref 11.5–15.5)
WBC: 5.2 10*3/uL (ref 4.0–10.5)

## 2021-10-03 ENCOUNTER — Ambulatory Visit (INDEPENDENT_AMBULATORY_CARE_PROVIDER_SITE_OTHER): Payer: Medicare Other | Admitting: Pulmonary Disease

## 2021-10-03 ENCOUNTER — Encounter: Payer: Self-pay | Admitting: Pulmonary Disease

## 2021-10-03 VITALS — BP 118/62 | HR 98 | Temp 98.5°F | Ht 65.0 in | Wt 181.8 lb

## 2021-10-03 DIAGNOSIS — Z01818 Encounter for other preprocedural examination: Secondary | ICD-10-CM

## 2021-10-03 DIAGNOSIS — J438 Other emphysema: Secondary | ICD-10-CM | POA: Diagnosis not present

## 2021-10-03 NOTE — Progress Notes (Signed)
@Patient  ID: , female    DOB: Feb 12, 1951, 71 y.o.   MRN: [id number]  Chief Complaint  Patient presents with   surgical clearance    Pt is here to be cleared for surgery. Pt states she is having surgery on her right hip. Pt states its scheduled for August 17th.     Referring provider: August 19, MD  HPI:   71 year old whom we are seeing for follow up of dyspnea on exertion and pre-operative evaluation.  DOE stable - no worse.  Returns for pre-operative evaluation. Reviewed ARISCAT model with patient and discussed results.   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  This did not inhibit ability to obtain history.  She has dyspnea on exertion for some months worse in the last 2 or 3 months.  Symptoms worse with exertion.  Worse with inclines or stairs.  Can be present on flat surface as well.  No positional changes that make things worse, when she rests or lies down symptoms are better.  No environmental or seasonal changes that make things better or worse.  She does endorse significant seasonal allergies and nasal congestion.  This has been a little bit worse with the pollen season of late.  No other alleviating or exacerbating factors.  She does note that she had been a bit less active over the last several months.  This is somewhat due to her shortness of breath also due to joint issues.  She had planned to have hip replacement recently but this was delayed given her worsening shortness of breath symptoms.  Most recent chest x-ray as I can view 12/2012 reveals clear lungs bilaterally without infiltrate or effusion.  She had a chest x-ray that can be viewed in Care Everywhere 05/2020 report reviewed which reveals "no acute cardiopulmonary disease."  TTE 04/2020 reviewed which revealed normal EF, grade 1 diastolic dysfunction, otherwise reassuring.  PMH: Hyperlipidemia, seasonal allergies,  hypertension Surgical history: Hip arthroplasty on the right Family history: Father with CVA, brother with CVA Social history: Former smoker, 10 pack-year history, quit in the 1980s per her report  Questionaires / Pulmonary Flowsheets:   ACT:      No data to display          MMRC:     No data to display          Epworth:      No data to display          Tests:   FENO:  No results found for: "NITRICOXIDE"  PFT:    Latest Ref Rng & Units 08/06/2020   11:07 AM  PFT Results  FVC-Pre L 1.78   FVC-Predicted Pre % 69   FVC-Post L 1.53   FVC-Predicted Post % 60   Pre FEV1/FVC % % 77   Post FEV1/FCV % % 75   FEV1-Pre L 1.37   FEV1-Predicted Pre % 69   FEV1-Post L 1.15   DLCO uncorrected ml/min/mmHg 9.31   DLCO UNC% % 45   DLCO corrected ml/min/mmHg 9.91   DLCO COR %Predicted % 48   DLVA Predicted % 74   TLC L 3.58   TLC % Predicted % 68   RV % Predicted % 72   Personally reviewed and interpreted as spirometry suggestive of moderate restriction versus gas trapping.  TLC confirms moderate restriction.  DLCO is severely reduced.  WALK:      No data to display  Imaging: Personally reviewed and as per EMR discussion this note  Lab Results: Personally reviewed, no significant elevation in eosinophils, hemoglobin stable slightly low at 11.6 CBC    Component Value Date/Time   WBC 5.2 10/01/2021 0921   RBC 4.27 10/01/2021 0921   HGB 11.3 (L) 10/01/2021 0921   HCT 34.0 (L) 10/01/2021 0921   PLT 226.0 10/01/2021 0921   MCV 79.4 10/01/2021 0921   MCH 26.2 06/27/2020 0835   MCHC 33.3 10/01/2021 0921   RDW 14.6 10/01/2021 0921   LYMPHSABS 0.9 10/01/2021 0921   MONOABS 0.5 10/01/2021 0921   EOSABS 0.2 10/01/2021 0921   BASOSABS 0.0 10/01/2021 0921    BMET    Component Value Date/Time   NA 142 06/21/2021 0839   K 4.5 06/21/2021 0839   CL 103 06/21/2021 0839   CO2 23 06/21/2021 0839   GLUCOSE 93 06/21/2021 0839   GLUCOSE 122 (H)  06/27/2020 0835   BUN 10 06/21/2021 0839   CREATININE 1.24 (H) 06/21/2021 0839   CREATININE 1.89 (H) 04/26/2020 1014   CALCIUM 9.9 06/21/2021 0839   GFRNONAA 48 (L) 06/27/2020 0835   GFRNONAA 27 (L) 04/26/2020 1014   GFRAA 31 (L) 04/26/2020 1014    BNP    Component Value Date/Time   BNP 18.8 05/20/2021 1623    ProBNP No results found for: "PROBNP"  Specialty Problems   None  No Known Allergies  Immunization History  Administered Date(s) Administered   PFIZER(Purple Top)SARS-COV-2 Vaccination 05/07/2019, 06/01/2019, 01/03/2020    Past Medical History:  Diagnosis Date   Anxiety    Arthritis    Chronic kidney disease    related to blood pressure meds- pt reports being taken off of meds    Depression    Diabetes mellitus    hx of not on meds, not on meds in over a year    Dyspnea    Edema    in legs per pt    Headache    occ headache behind eyes    Hypertension    Stroke (HCC)    x 3 weakness on right side     Tobacco History: Social History   Tobacco Use  Smoking Status Former   Types: Cigarettes   Start date: 1971   Quit date: 05/28/2007   Years since quitting: 14.3  Smokeless Tobacco Never   Counseling given: Not Answered   Continue to not smoke  Outpatient Encounter Medications as of 10/03/2021  Medication Sig   alendronate (FOSAMAX) 70 MG tablet Take 70 mg by mouth once a week.   aspirin 325 MG tablet Take 1 tablet (325 mg total) by mouth daily.   atorvastatin (LIPITOR) 40 MG tablet Take 1 tablet (40 mg total) by mouth daily at 6 PM.   cetirizine (ZYRTEC) 10 MG tablet Take 10 mg by mouth daily as needed for allergies.   citalopram (CELEXA) 10 MG tablet Take 10 mg by mouth daily.   escitalopram (LEXAPRO) 20 MG tablet Take 20 mg by mouth daily.   gabapentin (NEURONTIN) 100 MG capsule Take 100 mg by mouth 3 (three) times daily.   losartan (COZAAR) 25 MG tablet Take 25 mg by mouth daily.   Multiple Vitamin (MULTIVITAMIN WITH MINERALS) TABS Take 1  tablet by mouth daily.   zolpidem (AMBIEN) 10 MG tablet Take 12.5 mg by mouth at bedtime as needed for sleep.   [DISCONTINUED] Furosemide (LASIX PO) Take 10 mg by mouth daily.   [DISCONTINUED] furosemide (LASIX) 20 MG tablet Take  1 tablet (20 mg total) by mouth daily.   No facility-administered encounter medications on file as of 10/03/2021.     Review of Systems  Review of Systems  N/a Physical Exam  BP 118/62 (BP Location: Left Arm, Patient Position: Sitting, Cuff Size: Normal)   Pulse 98   Temp 98.5 F (36.9 C) (Oral)   Ht 5\' 5"  (1.651 m)   Wt 181 lb 12.8 oz (82.5 kg)   SpO2 96%   BMI 30.25 kg/m   Wt Readings from Last 5 Encounters:  10/03/21 181 lb 12.8 oz (82.5 kg)  05/28/21 186 lb (84.4 kg)  05/20/21 186 lb 6.4 oz (84.6 kg)  04/03/21 182 lb (82.6 kg)  03/19/21 183 lb (83 kg)    BMI Readings from Last 5 Encounters:  10/03/21 30.25 kg/m  05/28/21 31.43 kg/m  05/20/21 31.50 kg/m  04/03/21 30.29 kg/m  03/19/21 28.66 kg/m     Physical Exam General: Frail, in no distress Eyes: EOMI, no icterus Neck: Supple no JVP Cardiovascular: Regular rhythm, no murmur Pulmonary: Clear to auscultation bilaterally, normal work of breathing Abdomen: Nondistended, bowel sounds present  MSK: No synovitis, joint  neuro: Normal gait, slow to find words at times, no weakness Psych: Normal mood, flat affect   Assessment & Plan:   DOE: Suspect multifactorial but concern for deconditioning playing a major role.  In setting of MSK issues and difficulty ambulating.  PFTs with restriction and reduced DLCO.  No ILD on high-res CT scan, emphysema present.  Suspect emphysema contributing.  Did not respond well to Advair.  Escalated to Trelegy 7/22 without response.  Trialed as needed bronchodilators fall 2022 without improvement.  Encourage activity.    Emphysema: Related to history of cigarette smoking.  Contributor symptoms.  Not responding to inhalers.  Suspect deconditioning major  issue.  Preoperative evaluation: Pulmonary medicine does not provide surgical clearance rather preoperative risk stratification.  Based on the ARISCAT model, patient is low risk of 1.3% risk of postoperative pulmonary complication if duration of surgery is less than 3 hours.  Intermediate or 13.3% risk of postoperative pulmonary complication of duration of surgery is greater than 3 hours.  There are no modifiable risk factors.   Return if symptoms worsen or fail to improve.   Lanier Clam, MD 10/03/2021

## 2021-10-03 NOTE — Patient Instructions (Signed)
No changes to medications  I will send a copy of my note to the surgeon  Return to clinic as needed

## 2021-10-04 NOTE — Telephone Encounter (Signed)
OV notes and clearance form have been faxed back to [facility]. Nothing further needed at this time. ?

## 2021-10-22 ENCOUNTER — Other Ambulatory Visit: Payer: Self-pay | Admitting: Internal Medicine

## 2021-10-23 LAB — CBC
HCT: 37.6 % (ref 35.0–45.0)
Hemoglobin: 11.8 g/dL (ref 11.7–15.5)
MCH: 25.9 pg — ABNORMAL LOW (ref 27.0–33.0)
MCHC: 31.4 g/dL — ABNORMAL LOW (ref 32.0–36.0)
MCV: 82.5 fL (ref 80.0–100.0)
MPV: 10.5 fL (ref 7.5–12.5)
Platelets: 248 10*3/uL (ref 140–400)
RBC: 4.56 Million/uL (ref 3.80–5.10)
RDW: 14 % (ref 11.0–15.0)
WBC: 5.1 10*3/uL (ref 3.8–10.8)

## 2021-10-23 LAB — COMPLETE METABOLIC PANEL WITHOUT GFR
AG Ratio: 1.1 (calc) (ref 1.0–2.5)
ALT: 12 U/L (ref 6–29)
AST: 27 U/L (ref 10–35)
Albumin: 3.9 g/dL (ref 3.6–5.1)
Alkaline phosphatase (APISO): 75 U/L (ref 37–153)
BUN/Creatinine Ratio: 9 (calc) (ref 6–22)
BUN: 15 mg/dL (ref 7–25)
CO2: 21 mmol/L (ref 20–32)
Calcium: 10 mg/dL (ref 8.6–10.4)
Chloride: 101 mmol/L (ref 98–110)
Creat: 1.68 mg/dL — ABNORMAL HIGH (ref 0.50–1.05)
Globulin: 3.5 g/dL (ref 1.9–3.7)
Glucose, Bld: 67 mg/dL (ref 65–99)
Potassium: 4.5 mmol/L (ref 3.5–5.3)
Sodium: 139 mmol/L (ref 135–146)
Total Bilirubin: 0.5 mg/dL (ref 0.2–1.2)
Total Protein: 7.4 g/dL (ref 6.1–8.1)
eGFR: 33 mL/min/{1.73_m2} — ABNORMAL LOW

## 2021-10-23 LAB — LIPID PANEL
Cholesterol: 171 mg/dL (ref <200)
HDL: 45 mg/dL — ABNORMAL LOW (ref >=50)
LDL Cholesterol (Calc): 101 mg/dL (calc) — ABNORMAL HIGH
Non-HDL Cholesterol (Calc): 126 mg/dL (calc) (ref <130)
Total CHOL/HDL Ratio: 3.8 (calc) (ref <5.0)
Triglycerides: 146 mg/dL (ref <150)

## 2021-10-23 LAB — VITAMIN D 25 HYDROXY (VIT D DEFICIENCY, FRACTURES): Vit D, 25-Hydroxy: 54 ng/mL (ref 30–100)

## 2021-10-23 LAB — TSH: TSH: 0.7 m[IU]/L (ref 0.40–4.50)

## 2021-10-24 NOTE — Progress Notes (Addendum)
Anesthesia Review:  PCP: Fleet Contras  Clearance on chart dated 08/26/21 Telemedicine visit dated 07/11/21 and visit dated 07/18/21 on chart Called and requested LOV from 10/22/21 and clearance.  LVMM Called [phone]  LVMM for Sherrie at [facility] and requested clearance and OV note from 10/22/2021.   Cardiologist :   09/30/2021 preop exam - DR <ary Branch  Pulmonology- Lov 10/03/21 for surgical clearance on chart  Chest x-ray : EKG :  05/20/21  Echo : 05/08/20  CT Chest- 08/28/20  PFT- 08/06/20  Stress test: 05/28/21  Cardiac Cath :  Activity level: cannot do a flight of stairs without difficuty  Sleep Study/ CPAP : Fasting Blood Sugar :      / Checks Blood Sugar -- times a day:   Blood Thinner/ Instructions /Last Dose: ASA / Instructions/ Last Dose :   325 mg Aspirin - pt given no instructions to stop prior to surgery   10/22/21- CBc and CMP - epic  Dm- type 2 does not check glucose at home per pt and has not been on meds for many years per pt  Hgba1c- 10/29/21- 6.0  HX of stroke afftected right side.  PT came to preop in wheelchair.  Also uses walker.   CMP dobe  10/22/21 routed to DR olin on 10/29/21.

## 2021-10-24 NOTE — Patient Instructions (Signed)
SURGICAL WAITING ROOM VISITATION Patients having surgery or a procedure may have no more than 2 support people in the waiting area - these visitors may rotate.   Children under the age of 30 must have an adult with them who is not the patient. If the patient needs to stay at the hospital during part of their recovery, the visitor guidelines for inpatient rooms apply. Pre-op nurse will coordinate an appropriate time for 1 support person to accompany patient in pre-op.  This support person may not rotate.    Please refer to the [facility] website for the visitor guidelines for Inpatients (after your surgery is over and you are in a regular room).       Your procedure is scheduled on:  11/07/2021    Report to [facility] Main Entrance    Report to admitting at   [phone]   Call this number if you have problems the morning of surgery [phone]   Do not eat food :After Midnight.   After Midnight you may have the following liquids until ___0430 AM DAY OF SURGERY  Water Non-Citrus Juices (without pulp, NO RED) Carbonated Beverages Black Coffee (NO MILK/CREAM OR CREAMERS, sugar ok)  Clear Tea (NO MILK/CREAM OR CREAMERS, sugar ok) regular and decaf                             Plain Jell-O (NO RED)                                           Fruit ices (not with fruit pulp, NO RED)                                     Popsicles (NO RED)                                                               Sports drinks like Gatorade (NO RED)       The day of surgery:  Drink ONE (1) Pre-Surgery Clear Ensure or G2 at 0430 AM ( have completed by )  the morning of surgery. Drink in one sitting. Do not sip.  This drink was given to you during your hospital  pre-op appointment visit. Nothing else to drink after completing the  Pre-Surgery Clear Ensure or G2.         Oral Hygiene is also important to reduce your risk of infection.                                    Remember - BRUSH YOUR  TEETH THE MORNING OF SURGERY WITH YOUR REGULAR TOOTHPASTE   Do NOT smoke after Midnight   Take these medicines the morning of surgery with A SIP OF WATER:  celexa, lexapro, gabapentin   DO NOT TAKE ANY ORAL DIABETIC MEDICATIONS DAY OF YOUR SURGERY  Bring CPAP mask and tubing day of surgery.  You may not have any metal on your body including hair pins, jewelry, and body piercing             Do not wear make-up, lotions, powders, perfumes/cologne, or deodorant  Do not wear nail polish including gel and S&S, artificial/acrylic nails, or any other type of covering on natural nails including finger and toenails. If you have artificial nails, gel coating, etc. that needs to be removed by a nail salon please have this removed prior to surgery or surgery may need to be canceled/ delayed if the surgeon/ anesthesia feels like they are unable to be safely monitored.   Do not shave  48 hours prior to surgery.               Men may shave face and neck.   Do not bring valuables to the hospital. [place].   Contacts, dentures or bridgework may not be worn into surgery.   Bring small overnight bag day of surgery.   DO NOT [place]. PHARMACY WILL DISPENSE MEDICATIONS LISTED ON YOUR MEDICATION LIST TO YOU DURING YOUR ADMISSION [place]!    Patients discharged on the day of surgery will not be allowed to drive home.  Someone NEEDS to stay with you for the first 24 hours after anesthesia.   Special Instructions: Bring a copy of your healthcare power of attorney and living will documents         the day of surgery if you haven't scanned them before.              Please read over the following fact sheets you were given: IF YOU HAVE QUESTIONS ABOUT YOUR PRE-OP INSTRUCTIONS PLEASE CALL [phone]     [facility] - Preparing for Surgery Before surgery, you can play an important role.   Because skin is not sterile, your skin needs to be as free of germs as possible.  You can reduce the number of germs on your skin by washing with CHG (chlorahexidine gluconate) soap before surgery.  CHG is an antiseptic cleaner which kills germs and bonds with the skin to continue killing germs even after washing. Please DO NOT use if you have an allergy to CHG or antibacterial soaps.  If your skin becomes reddened/irritated stop using the CHG and inform your nurse when you arrive at Short Stay. Do not shave (including legs and underarms) for at least 48 hours prior to the first CHG shower.  You may shave your face/neck. Please follow these instructions carefully:  1.  Shower with CHG Soap the night before surgery and the  morning of Surgery.  2.  If you choose to wash your hair, wash your hair first as usual with your  normal  shampoo.  3.  After you shampoo, rinse your hair and body thoroughly to remove the  shampoo.                           4.  Use CHG as you would any other liquid soap.  You can apply chg directly  to the skin and wash                       Gently with a scrungie or clean washcloth.  5.  Apply the CHG Soap to your  body ONLY FROM THE NECK DOWN.   Do not use on face/ open                           Wound or open sores. Avoid contact with eyes, ears mouth and genitals (private parts).                       Wash face,  Genitals (private parts) with your normal soap.             6.  Wash thoroughly, paying special attention to the area where your surgery  will be performed.  7.  Thoroughly rinse your body with warm water from the neck down.  8.  DO NOT shower/wash with your normal soap after using and rinsing off  the CHG Soap.                9.  Pat yourself dry with a clean towel.            10.  Wear clean pajamas.            11.  Place clean sheets on your bed the night of your first shower and do not  sleep with pets. Day of Surgery : Do not apply any lotions/deodorants the  morning of surgery.  Please wear clean clothes to the hospital/surgery center.  FAILURE TO FOLLOW THESE INSTRUCTIONS MAY RESULT IN THE CANCELLATION OF YOUR SURGERY PATIENT SIGNATURE_________________________________  NURSE SIGNATURE__________________________________  ________________________________________________________________________

## 2021-10-25 ENCOUNTER — Other Ambulatory Visit (HOSPITAL_COMMUNITY): Payer: Medicare Other

## 2021-10-29 ENCOUNTER — Ambulatory Visit: Payer: Medicare Other | Admitting: Podiatry

## 2021-10-29 ENCOUNTER — Encounter (HOSPITAL_COMMUNITY)
Admission: RE | Admit: 2021-10-29 | Discharge: 2021-10-29 | Disposition: A | Discharge status: Home / Self Care | Payer: Medicare Other | Source: Ambulatory Visit | Attending: Orthopedic Surgery | Admitting: Orthopedic Surgery

## 2021-10-29 ENCOUNTER — Other Ambulatory Visit: Payer: Self-pay

## 2021-10-29 ENCOUNTER — Encounter (HOSPITAL_COMMUNITY): Payer: Self-pay

## 2021-10-29 DIAGNOSIS — Z01818 Encounter for other preprocedural examination: Secondary | ICD-10-CM | POA: Insufficient documentation

## 2021-10-29 DIAGNOSIS — Z96641 Presence of right artificial hip joint: Secondary | ICD-10-CM | POA: Insufficient documentation

## 2021-10-29 DIAGNOSIS — E1149 Type 2 diabetes mellitus with other diabetic neurological complication: Secondary | ICD-10-CM | POA: Insufficient documentation

## 2021-10-29 HISTORY — DX: Other complications of anesthesia, initial encounter: T88.59XA

## 2021-10-29 LAB — SURGICAL PCR SCREEN
MRSA, PCR: NEGATIVE
Staphylococcus aureus: NEGATIVE

## 2021-10-29 LAB — HEMOGLOBIN A1C
Hgb A1c MFr Bld: 6 % — ABNORMAL HIGH (ref 4.8–5.6)
Mean Plasma Glucose: 125.5 mg/dL

## 2021-10-29 LAB — GLUCOSE, CAPILLARY: Glucose-Capillary: 89 mg/dL (ref 70–99)

## 2021-10-30 NOTE — Progress Notes (Signed)
Anesthesia Chart Review:   Case: [id number] Date/Time: 11/07/21 0715   Procedure: CONVERSION TO TOTAL HIP-POSTERIOR APPROACH (Right: Hip)   Anesthesia type: Spinal   Pre-op diagnosis: Failed right hip hemiarthroplasty   Location: [facility] ROOM 09 / [facility]   Surgeons: Durene Romans, MD       DISCUSSION: Pt is 71 years old with hx CVA x3, HTN, DM, CKD  Cr 1.68. Pt does have CKD and Cr has been intermittently elevated in the past.   VS: BP 105/67   Pulse 76   Temp 36.8 C (Oral)   Resp 16   Ht 5\' 5"  (1.651 m)   SpO2 97%   BMI 30.25 kg/m   PROVIDERS: - PCP is , MD who cleared pt for surgery 08/26/21 - Cardiologist is 10/26/21, MD. Last office visit 05/20/21. Cleared for surgery at acceptable risk by 05/22/21, NP at 09/30/21 telephone visit.  - Pulmonologist is 12/01/21, MD who sees pt for DOE. Suspects emphysema and deconditioning are causative. Cleared for surgery. He notes pt has no modifiable risk factors.   LABS: Labs reviewed: Acceptable for surgery. - CBC 10/22/21: acceptable - CMP 10/22/21: Cr 1.68  (all labs ordered are listed, but only abnormal results are displayed)  Labs Reviewed  HEMOGLOBIN A1C - Abnormal; Notable for the following components:      Result Value   Hgb A1c MFr Bld 6.0 (*)    All other components within normal limits  SURGICAL PCR SCREEN  GLUCOSE, CAPILLARY  TYPE AND SCREEN     IMAGES: CT chest high resolution 08/28/20:  1. Moderate to severe centrilobular and paraseptal emphysema with mild diffuse bronchial wall thickening, suggesting COPD.  2. No evidence of interstitial lung disease. Scattered minimal cylindrical bronchiectasis in the medial lower lungs. 3. Anterior left lower lobe 4 mm solid pulmonary nodule.  4. Three-vessel coronary atherosclerosis. 5. Posterior left thyroid 1.7 cm hypodense nodule.   EKG 05/20/21: NSR. Low voltage.    CV: Nuclear stress test 05/28/21:    The study is normal. The study is low  risk.   No ST deviation was noted.   LV perfusion is normal. There is no evidence of ischemia. There is no evidence of infarction.   Left ventricular function is normal. Nuclear stress EF: 71 %. The left ventricular ejection fraction is hyperdynamic (>65%). End diastolic cavity size is normal. End systolic cavity size is normal.   Prior study not available for comparison.  Echo 05/08/20:  1. Left ventricular ejection fraction, by estimation, is 50 to 55%. The left ventricle has low normal function. The left ventricle has no regional wall motion abnormalities. Left ventricular diastolic parameters are consistent with Grade I diastolic dysfunction (impaired relaxation).  2. Right ventricular systolic function is normal. The right ventricular size is normal. There is normal pulmonary artery systolic pressure.  3. The mitral valve is normal in structure. Trivial mitral valve regurgitation. No evidence of mitral stenosis.  4. The aortic valve is tricuspid. Aortic valve regurgitation is not visualized. No aortic stenosis is present.  5. The inferior vena cava is normal in size with greater than 50% respiratory variability, suggesting right atrial pressure of 3 mmHg.    Past Medical History:  Diagnosis Date   Anxiety    Arthritis    Chronic kidney disease    related to blood pressure meds- pt reports being taken off of meds    Complication of anesthesia    slow to wake up many years ago  per pt   Depression    Diabetes mellitus    hx of not on meds, not on meds in over a year    Dyspnea    with exertion   Edema    in legs per pt    Hypertension    Stroke (HCC)    x 3 weakness on right side     Past Surgical History:  Procedure Laterality Date   HIP ARTHROPLASTY Right 11/24/2012   Procedure: RIGHT HIP HEMIARTHROPLASTY;  Surgeon: Verlee Rossetti, MD;  Location: [facility];  Service: Orthopedics;  Laterality: Right;  hemiatroplasty, DePuy Triloc    MEDICATIONS:  alendronate (FOSAMAX) 70 MG  tablet   aspirin 325 MG tablet   atorvastatin (LIPITOR) 40 MG tablet   cetirizine (ZYRTEC) 10 MG tablet   citalopram (CELEXA) 10 MG tablet   escitalopram (LEXAPRO) 20 MG tablet   gabapentin (NEURONTIN) 100 MG capsule   losartan (COZAAR) 25 MG tablet   Multiple Vitamin (MULTIVITAMIN WITH MINERALS) TABS   zolpidem (AMBIEN) 10 MG tablet   No current facility-administered medications for this encounter.    If no changes, I anticipate pt can proceed with surgery as scheduled.   Rica Mast, PhD, FNP-BC [facility]/Anesthesiology Phone: [phone] 11/01/2021 3:19 PM

## 2021-11-01 NOTE — Anesthesia Preprocedure Evaluation (Signed)
Anesthesia Evaluation  Patient identified by MRN, date of birth, ID band Patient awake    Reviewed: Allergy & Precautions, NPO status , Patient's Chart, lab work & pertinent test results  History of Anesthesia Complications Negative for: history of anesthetic complications  Airway Mallampati: III  TM Distance: >3 FB Neck ROM: Full    Dental  (+) Dental Advisory Given, Edentulous Upper, Edentulous Lower   Pulmonary shortness of breath, former smoker,  Qui smoking 2009  Chronic SOB- Pulmonologist is Vilma Meckel, MD who sees pt for DOE. Suspects emphysema and deconditioning are causative- was told she didn't need to follow up again as of a few months ago   CT chest high resolution 08/28/20:  1. Moderate to severe centrilobular and paraseptal emphysema with mild diffuse bronchial wall thickening, suggesting COPD.  2. No evidence of interstitial lung disease. Scattered minimal cylindrical bronchiectasis in the medial lower lungs. 3. Anterior left lower lobe 4 mm solid pulmonary nodule.  4. Three-vessel coronary atherosclerosis. 5. Posterior left thyroid 1.7 cm hypodense nodule.   Pulmonary exam normal breath sounds clear to auscultation       Cardiovascular hypertension (118/73), Pt. on medications Normal cardiovascular exam Rhythm:Regular Rate:Normal  Nuclear stress test 05/28/21:  . The study is normal. The study is low risk. . No ST deviation was noted. . LV perfusion is normal. There is no evidence of ischemia. There is no evidence of infarction. . Left ventricular function is normal. Nuclear stress EF: 71 %. The left ventricular ejection fraction is hyperdynamic (>65%). End diastolic cavity size is normal. End systolic cavity size is normal. . Prior study not available for comparison.  Echo 05/08/20:  1. Left ventricular ejection fraction, by estimation, is 50 to 55%. The left ventricle has low normal function. The left  ventricle has no regional wall motion abnormalities. Left ventricular diastolic parameters are consistent with Grade I diastolic dysfunction (impaired relaxation).  2. Right ventricular systolic function is normal. The right ventricular size is normal. There is normal pulmonary artery systolic pressure.  3. The mitral valve is normal in structure. Trivial mitral valve regurgitation. No evidence of mitral stenosis.  4. The aortic valve is tricuspid. Aortic valve regurgitation is not visualized. No aortic stenosis is present.  5. The inferior vena cava is normal in size with greater than 50% respiratory variability, suggesting right atrial pressure of 3 mmHg.    Neuro/Psych PSYCHIATRIC DISORDERS Anxiety Depression CVA (2014 x 2, residual R hemiparesis/spasticity), Residual Symptoms    GI/Hepatic negative GI ROS, Neg liver ROS,   Endo/Other  diabetes  Renal/GU CRFRenal diseaseCr 1.68  negative genitourinary   Musculoskeletal  (+) Arthritis , Osteoarthritis,  Failed R hip    Abdominal   Peds  Hematology   Anesthesia Other Findings   Reproductive/Obstetrics negative OB ROS                           Anesthesia Physical Anesthesia Plan  ASA: 3  Anesthesia Plan: General   Post-op Pain Management: Tylenol PO (pre-op)*   Induction: Intravenous  PONV Risk Score and Plan: 3 and Ondansetron, Dexamethasone and Treatment may vary due to age or medical condition  Airway Management Planned: Oral ETT  Additional Equipment: None  Intra-op Plan:   Post-operative Plan: Extubation in OR  Informed Consent: I have reviewed the patients History and Physical, chart, labs and discussed the procedure including the risks, benefits and alternatives for the proposed anesthesia with the patient or authorized  representative who has indicated his/her understanding and acceptance.     Dental advisory given  Plan Discussed with: CRNA  Anesthesia Plan Comments: (Last hip  surgery under general 2014)      Anesthesia Quick Evaluation

## 2021-11-05 ENCOUNTER — Ambulatory Visit: Payer: Medicare Other | Admitting: Podiatry

## 2021-11-07 ENCOUNTER — Inpatient Hospital Stay (HOSPITAL_COMMUNITY): Payer: Medicare Other

## 2021-11-07 ENCOUNTER — Inpatient Hospital Stay (HOSPITAL_COMMUNITY): Payer: Medicare Other | Admitting: Emergency Medicine

## 2021-11-07 ENCOUNTER — Other Ambulatory Visit: Payer: Self-pay

## 2021-11-07 ENCOUNTER — Inpatient Hospital Stay (HOSPITAL_COMMUNITY)
Admission: RE | Admit: 2021-11-07 | Discharge: 2021-11-12 | DRG: 468 | Disposition: A | Discharge status: Skilled Nursing Facility | Payer: Medicare Other | Attending: Orthopedic Surgery | Admitting: Orthopedic Surgery

## 2021-11-07 ENCOUNTER — Encounter (HOSPITAL_COMMUNITY)
Admission: RE | Disposition: A | Discharge status: Skilled Nursing Facility | Payer: Self-pay | Source: Home / Self Care | Attending: Orthopedic Surgery

## 2021-11-07 ENCOUNTER — Encounter (HOSPITAL_COMMUNITY): Payer: Self-pay | Admitting: Orthopedic Surgery

## 2021-11-07 ENCOUNTER — Inpatient Hospital Stay (HOSPITAL_COMMUNITY): Payer: Medicare Other | Admitting: Certified Registered Nurse Anesthetist

## 2021-11-07 DIAGNOSIS — I129 Hypertensive chronic kidney disease with stage 1 through stage 4 chronic kidney disease, or unspecified chronic kidney disease: Secondary | ICD-10-CM | POA: Diagnosis not present

## 2021-11-07 DIAGNOSIS — Z8673 Personal history of transient ischemic attack (TIA), and cerebral infarction without residual deficits: Secondary | ICD-10-CM | POA: Diagnosis not present

## 2021-11-07 DIAGNOSIS — Z87891 Personal history of nicotine dependence: Secondary | ICD-10-CM

## 2021-11-07 DIAGNOSIS — Z823 Family history of stroke: Secondary | ICD-10-CM

## 2021-11-07 DIAGNOSIS — I10 Essential (primary) hypertension: Secondary | ICD-10-CM | POA: Diagnosis present

## 2021-11-07 DIAGNOSIS — N189 Chronic kidney disease, unspecified: Secondary | ICD-10-CM

## 2021-11-07 DIAGNOSIS — T84090A Other mechanical complication of internal right hip prosthesis, initial encounter: Secondary | ICD-10-CM | POA: Diagnosis present

## 2021-11-07 DIAGNOSIS — M1611 Unilateral primary osteoarthritis, right hip: Secondary | ICD-10-CM | POA: Diagnosis present

## 2021-11-07 DIAGNOSIS — Z96641 Presence of right artificial hip joint: Principal | ICD-10-CM

## 2021-11-07 DIAGNOSIS — M25551 Pain in right hip: Secondary | ICD-10-CM | POA: Diagnosis present

## 2021-11-07 DIAGNOSIS — Z01818 Encounter for other preprocedural examination: Secondary | ICD-10-CM

## 2021-11-07 DIAGNOSIS — E1149 Type 2 diabetes mellitus with other diabetic neurological complication: Secondary | ICD-10-CM

## 2021-11-07 DIAGNOSIS — E1122 Type 2 diabetes mellitus with diabetic chronic kidney disease: Secondary | ICD-10-CM

## 2021-11-07 DIAGNOSIS — Y792 Prosthetic and other implants, materials and accessory orthopedic devices associated with adverse incidents: Secondary | ICD-10-CM | POA: Diagnosis present

## 2021-11-07 HISTORY — PX: CONVERSION TO TOTAL HIP: SHX5784

## 2021-11-07 LAB — TYPE AND SCREEN
ABO/RH(D): O POS
Antibody Screen: NEGATIVE

## 2021-11-07 LAB — GLUCOSE, CAPILLARY: Glucose-Capillary: 119 mg/dL — ABNORMAL HIGH (ref 70–99)

## 2021-11-07 SURGERY — CONVERSION, PREVIOUS HIP SURGERY, TO TOTAL HIP ARTHROPLASTY
Anesthesia: General | Site: Hip | Laterality: Right

## 2021-11-07 MED ORDER — ONDANSETRON HCL 4 MG PO TABS: 4.0000 mg | ORAL_TABLET | Freq: Four times a day (QID) | ORAL | Status: DC | PRN

## 2021-11-07 MED ORDER — ACETAMINOPHEN 325 MG PO TABS
325.0000 mg | ORAL_TABLET | Freq: Four times a day (QID) | ORAL | Status: DC | PRN
  Administered 2021-11-09: 650 mg via ORAL
  Filled 2021-11-07: qty 2

## 2021-11-07 MED ORDER — ATORVASTATIN CALCIUM 40 MG PO TABS
40.0000 mg | ORAL_TABLET | Freq: Every day | ORAL | Status: DC
  Administered 2021-11-07 – 2021-11-11 (×5): 40 mg via ORAL
  Filled 2021-11-07 (×5): qty 1

## 2021-11-07 MED ORDER — MENTHOL 3 MG MT LOZG: 1.0000 | LOZENGE | OROMUCOSAL | Status: DC | PRN

## 2021-11-07 MED ORDER — ZOLPIDEM TARTRATE 5 MG PO TABS
5.0000 mg | ORAL_TABLET | Freq: Every evening | ORAL | Status: DC | PRN
  Administered 2021-11-07 – 2021-11-11 (×5): 5 mg via ORAL
  Filled 2021-11-07 (×5): qty 1

## 2021-11-07 MED ORDER — FENTANYL CITRATE (PF) 100 MCG/2ML IJ SOLN
INTRAMUSCULAR | Status: DC | PRN
Start: 2021-11-07 — End: 2021-11-07
  Administered 2021-11-07 (×2): 50 ug via INTRAVENOUS

## 2021-11-07 MED ORDER — DIPHENHYDRAMINE HCL 12.5 MG/5ML PO ELIX: 12.5000 mg | ORAL_SOLUTION | ORAL | Status: DC | PRN

## 2021-11-07 MED ORDER — 0.9 % SODIUM CHLORIDE (POUR BTL) OPTIME
TOPICAL | Status: DC | PRN
  Administered 2021-11-07: 1000 mL

## 2021-11-07 MED ORDER — METHOCARBAMOL 500 MG IVPB - SIMPLE MED
INTRAVENOUS | Status: AC
  Administered 2021-11-07: 500 mg via INTRAVENOUS
  Filled 2021-11-07: qty 55

## 2021-11-07 MED ORDER — OXYCODONE HCL 5 MG PO TABS: 5.0000 mg | ORAL_TABLET | Freq: Once | ORAL | Status: DC | PRN

## 2021-11-07 MED ORDER — ROCURONIUM BROMIDE 10 MG/ML (PF) SYRINGE
PREFILLED_SYRINGE | INTRAVENOUS | Status: DC | PRN
  Administered 2021-11-07: 100 mg via INTRAVENOUS

## 2021-11-07 MED ORDER — OXYCODONE HCL 5 MG/5ML PO SOLN: 5.0000 mg | Freq: Once | ORAL | Status: DC | PRN

## 2021-11-07 MED ORDER — PHENYLEPHRINE 80 MCG/ML (10ML) SYRINGE FOR IV PUSH (FOR BLOOD PRESSURE SUPPORT)
PREFILLED_SYRINGE | INTRAVENOUS | Status: AC
  Filled 2021-11-07: qty 10

## 2021-11-07 MED ORDER — MORPHINE SULFATE (PF) 2 MG/ML IV SOLN: 0.5000 mg | INTRAVENOUS | Status: DC | PRN

## 2021-11-07 MED ORDER — ROCURONIUM BROMIDE 10 MG/ML (PF) SYRINGE
PREFILLED_SYRINGE | INTRAVENOUS | Status: AC
  Filled 2021-11-07: qty 10

## 2021-11-07 MED ORDER — METOCLOPRAMIDE HCL 5 MG PO TABS: 5.0000 mg | ORAL_TABLET | Freq: Three times a day (TID) | ORAL | Status: DC | PRN

## 2021-11-07 MED ORDER — HYDROCODONE-ACETAMINOPHEN 5-325 MG PO TABS
1.0000 | ORAL_TABLET | ORAL | Status: DC | PRN
  Administered 2021-11-08 – 2021-11-12 (×6): 1 via ORAL
  Filled 2021-11-07: qty 2
  Filled 2021-11-07: qty 1
  Filled 2021-11-07: qty 2
  Filled 2021-11-07 (×3): qty 1

## 2021-11-07 MED ORDER — ONDANSETRON HCL 4 MG/2ML IJ SOLN
INTRAMUSCULAR | Status: AC
  Filled 2021-11-07: qty 2

## 2021-11-07 MED ORDER — DEXAMETHASONE SODIUM PHOSPHATE 10 MG/ML IJ SOLN
INTRAMUSCULAR | Status: AC
  Filled 2021-11-07: qty 1

## 2021-11-07 MED ORDER — DEXAMETHASONE SODIUM PHOSPHATE 10 MG/ML IJ SOLN
8.0000 mg | Freq: Once | INTRAMUSCULAR | Status: AC
  Administered 2021-11-07: 4 mg via INTRAVENOUS

## 2021-11-07 MED ORDER — SODIUM CHLORIDE 0.9 % IV SOLN: INTRAVENOUS | Status: DC

## 2021-11-07 MED ORDER — ESCITALOPRAM OXALATE 20 MG PO TABS
20.0000 mg | ORAL_TABLET | Freq: Every day | ORAL | Status: DC
  Administered 2021-11-08 – 2021-11-12 (×5): 20 mg via ORAL
  Filled 2021-11-07 (×5): qty 1

## 2021-11-07 MED ORDER — TRANEXAMIC ACID-NACL 1000-0.7 MG/100ML-% IV SOLN
1000.0000 mg | Freq: Once | INTRAVENOUS | Status: AC
  Administered 2021-11-07: 1000 mg via INTRAVENOUS
  Filled 2021-11-07: qty 100

## 2021-11-07 MED ORDER — ORAL CARE MOUTH RINSE: 15.0000 mL | Freq: Once | OROMUCOSAL | Status: AC

## 2021-11-07 MED ORDER — EPHEDRINE 5 MG/ML INJ
INTRAVENOUS | Status: AC
  Filled 2021-11-07: qty 5

## 2021-11-07 MED ORDER — DOCUSATE SODIUM 100 MG PO CAPS
100.0000 mg | ORAL_CAPSULE | Freq: Two times a day (BID) | ORAL | Status: DC
  Administered 2021-11-07 – 2021-11-12 (×10): 100 mg via ORAL
  Filled 2021-11-07 (×10): qty 1

## 2021-11-07 MED ORDER — ONDANSETRON HCL 4 MG/2ML IJ SOLN
4.0000 mg | Freq: Four times a day (QID) | INTRAMUSCULAR | Status: DC | PRN
  Administered 2021-11-08: 4 mg via INTRAVENOUS
  Filled 2021-11-07: qty 2

## 2021-11-07 MED ORDER — PHENYLEPHRINE HCL (PRESSORS) 10 MG/ML IV SOLN
INTRAVENOUS | Status: DC | PRN
  Administered 2021-11-07 (×2): 80 ug via INTRAVENOUS
  Administered 2021-11-07: 40 ug via INTRAVENOUS
  Administered 2021-11-07 (×2): 80 ug via INTRAVENOUS

## 2021-11-07 MED ORDER — LACTATED RINGERS IV SOLN: INTRAVENOUS | Status: DC

## 2021-11-07 MED ORDER — LORATADINE 10 MG PO TABS
10.0000 mg | ORAL_TABLET | Freq: Every day | ORAL | Status: DC
  Administered 2021-11-10 – 2021-11-11 (×2): 10 mg via ORAL
  Filled 2021-11-07 (×5): qty 1

## 2021-11-07 MED ORDER — LIDOCAINE 2% (20 MG/ML) 5 ML SYRINGE
INTRAMUSCULAR | Status: DC | PRN
  Administered 2021-11-07: 60 mg via INTRAVENOUS

## 2021-11-07 MED ORDER — METOCLOPRAMIDE HCL 5 MG/ML IJ SOLN: 5.0000 mg | Freq: Three times a day (TID) | INTRAMUSCULAR | Status: DC | PRN

## 2021-11-07 MED ORDER — ONDANSETRON HCL 4 MG/2ML IJ SOLN: 4.0000 mg | Freq: Once | INTRAMUSCULAR | Status: DC | PRN

## 2021-11-07 MED ORDER — FERROUS SULFATE 325 (65 FE) MG PO TABS
325.0000 mg | ORAL_TABLET | Freq: Three times a day (TID) | ORAL | Status: DC
  Administered 2021-11-08 – 2021-11-12 (×12): 325 mg via ORAL
  Filled 2021-11-07 (×12): qty 1

## 2021-11-07 MED ORDER — DEXAMETHASONE SODIUM PHOSPHATE 10 MG/ML IJ SOLN
10.0000 mg | Freq: Once | INTRAMUSCULAR | Status: AC
  Administered 2021-11-08: 10 mg via INTRAVENOUS
  Filled 2021-11-07: qty 1

## 2021-11-07 MED ORDER — PHENOL 1.4 % MT LIQD: 1.0000 | OROMUCOSAL | Status: DC | PRN

## 2021-11-07 MED ORDER — SUGAMMADEX SODIUM 200 MG/2ML IV SOLN
INTRAVENOUS | Status: DC | PRN
  Administered 2021-11-07: 200 mg via INTRAVENOUS

## 2021-11-07 MED ORDER — EPHEDRINE SULFATE-NACL 50-0.9 MG/10ML-% IV SOSY
PREFILLED_SYRINGE | INTRAVENOUS | Status: DC | PRN
  Administered 2021-11-07: 7.5 mg via INTRAVENOUS

## 2021-11-07 MED ORDER — ONDANSETRON HCL 4 MG/2ML IJ SOLN
INTRAMUSCULAR | Status: DC | PRN
  Administered 2021-11-07: 4 mg via INTRAVENOUS

## 2021-11-07 MED ORDER — CEFAZOLIN SODIUM-DEXTROSE 2-4 GM/100ML-% IV SOLN
2.0000 g | Freq: Four times a day (QID) | INTRAVENOUS | Status: AC
  Administered 2021-11-07 (×2): 2 g via INTRAVENOUS
  Filled 2021-11-07 (×2): qty 100

## 2021-11-07 MED ORDER — ASPIRIN 81 MG PO CHEW
81.0000 mg | CHEWABLE_TABLET | Freq: Two times a day (BID) | ORAL | Status: DC
  Administered 2021-11-07 – 2021-11-12 (×10): 81 mg via ORAL
  Filled 2021-11-07 (×11): qty 1

## 2021-11-07 MED ORDER — POLYETHYLENE GLYCOL 3350 17 G PO PACK: 17.0000 g | PACK | Freq: Every day | ORAL | Status: DC | PRN

## 2021-11-07 MED ORDER — CITALOPRAM HYDROBROMIDE 20 MG PO TABS
10.0000 mg | ORAL_TABLET | Freq: Every day | ORAL | Status: DC
  Administered 2021-11-08 – 2021-11-12 (×5): 10 mg via ORAL
  Filled 2021-11-07 (×5): qty 1

## 2021-11-07 MED ORDER — BISACODYL 10 MG RE SUPP: 10.0000 mg | Freq: Every day | RECTAL | Status: DC | PRN

## 2021-11-07 MED ORDER — HYDROMORPHONE HCL 1 MG/ML IJ SOLN
INTRAMUSCULAR | Status: AC
  Administered 2021-11-07: 0.25 mg via INTRAVENOUS
  Filled 2021-11-07: qty 1

## 2021-11-07 MED ORDER — PROPOFOL 10 MG/ML IV BOLUS
INTRAVENOUS | Status: DC | PRN
  Administered 2021-11-07: 100 mg via INTRAVENOUS

## 2021-11-07 MED ORDER — PHENYLEPHRINE HCL-NACL 20-0.9 MG/250ML-% IV SOLN
INTRAVENOUS | Status: AC
  Filled 2021-11-07: qty 500

## 2021-11-07 MED ORDER — CEFAZOLIN SODIUM-DEXTROSE 2-4 GM/100ML-% IV SOLN
2.0000 g | INTRAVENOUS | Status: AC
  Administered 2021-11-07: 2 g via INTRAVENOUS
  Filled 2021-11-07: qty 100

## 2021-11-07 MED ORDER — GABAPENTIN 100 MG PO CAPS
100.0000 mg | ORAL_CAPSULE | Freq: Three times a day (TID) | ORAL | Status: DC
  Administered 2021-11-07 – 2021-11-12 (×15): 100 mg via ORAL
  Filled 2021-11-07 (×15): qty 1

## 2021-11-07 MED ORDER — LOSARTAN POTASSIUM 25 MG PO TABS
25.0000 mg | ORAL_TABLET | Freq: Every day | ORAL | Status: DC
  Administered 2021-11-09 – 2021-11-12 (×4): 25 mg via ORAL
  Filled 2021-11-07 (×5): qty 1

## 2021-11-07 MED ORDER — HYDROMORPHONE HCL 1 MG/ML IJ SOLN
0.2500 mg | INTRAMUSCULAR | Status: DC | PRN
  Administered 2021-11-07 (×3): 0.25 mg via INTRAVENOUS

## 2021-11-07 MED ORDER — PROPOFOL 10 MG/ML IV BOLUS
INTRAVENOUS | Status: AC
  Filled 2021-11-07: qty 20

## 2021-11-07 MED ORDER — METHOCARBAMOL 500 MG IVPB - SIMPLE MED: 500.0000 mg | Freq: Four times a day (QID) | INTRAVENOUS | Status: DC | PRN

## 2021-11-07 MED ORDER — HYDROCODONE-ACETAMINOPHEN 7.5-325 MG PO TABS
1.0000 | ORAL_TABLET | ORAL | Status: DC | PRN
  Administered 2021-11-07 – 2021-11-10 (×8): 1 via ORAL
  Filled 2021-11-07 (×8): qty 1

## 2021-11-07 MED ORDER — METHOCARBAMOL 500 MG PO TABS: 500.0000 mg | ORAL_TABLET | Freq: Four times a day (QID) | ORAL | Status: DC | PRN

## 2021-11-07 MED ORDER — CHLORHEXIDINE GLUCONATE 0.12 % MT SOLN
15.0000 mL | Freq: Once | OROMUCOSAL | Status: AC
  Administered 2021-11-07: 15 mL via OROMUCOSAL

## 2021-11-07 MED ORDER — POVIDONE-IODINE 10 % EX SWAB
2.0000 | Freq: Once | CUTANEOUS | Status: AC
  Administered 2021-11-07: 2 via TOPICAL

## 2021-11-07 MED ORDER — ACETAMINOPHEN 500 MG PO TABS
1000.0000 mg | ORAL_TABLET | Freq: Once | ORAL | Status: AC
Start: 2021-11-07 — End: 2021-11-07
  Administered 2021-11-07: 1000 mg via ORAL
  Filled 2021-11-07: qty 2

## 2021-11-07 MED ORDER — LIDOCAINE HCL (PF) 2 % IJ SOLN
INTRAMUSCULAR | Status: AC
  Filled 2021-11-07: qty 5

## 2021-11-07 MED ORDER — TRANEXAMIC ACID-NACL 1000-0.7 MG/100ML-% IV SOLN
1000.0000 mg | INTRAVENOUS | Status: AC
  Administered 2021-11-07: 1000 mg via INTRAVENOUS
  Filled 2021-11-07: qty 100

## 2021-11-07 MED ORDER — FENTANYL CITRATE (PF) 100 MCG/2ML IJ SOLN
INTRAMUSCULAR | Status: AC
  Filled 2021-11-07: qty 2

## 2021-11-07 SURGICAL SUPPLY — 49 items
ADH SKN CLS APL DERMABOND .7 (GAUZE/BANDAGES/DRESSINGS) ×1
ARTICULEZE HEAD 36 12 (Hips) ×1 IMPLANT
BAG COUNTER SPONGE SURGICOUNT (BAG) IMPLANT
BAG SPEC THK2 15X12 ZIP CLS (MISCELLANEOUS) ×1
BAG SPNG CNTER NS LX DISP (BAG)
BAG ZIPLOCK 12X15 (MISCELLANEOUS) ×1 IMPLANT
BLADE SAW SGTL 18X1.27X75 (BLADE) ×1 IMPLANT
COVER SURGICAL LIGHT HANDLE (MISCELLANEOUS) ×1 IMPLANT
CUP ACETBLR 52 OD PINNACLE (Hips) IMPLANT
DERMABOND ADVANCED (GAUZE/BANDAGES/DRESSINGS) ×1
DERMABOND ADVANCED .7 DNX12 (GAUZE/BANDAGES/DRESSINGS) ×1 IMPLANT
DRAPE INCISE IOBAN 85X60 (DRAPES) ×1 IMPLANT
DRAPE ORTHO SPLIT 77X108 STRL (DRAPES) ×2
DRAPE POUCH INSTRU U-SHP 10X18 (DRAPES) ×1 IMPLANT
DRAPE SURG ORHT 6 SPLT 77X108 (DRAPES) ×2 IMPLANT
DRAPE U-SHAPE 47X51 STRL (DRAPES) ×2 IMPLANT
DRESSING AQUACEL AG SP 3.5X10 (GAUZE/BANDAGES/DRESSINGS) IMPLANT
DRSG AQUACEL AG SP 3.5X10 (GAUZE/BANDAGES/DRESSINGS) ×1
DURAPREP 26ML APPLICATOR (WOUND CARE) ×1 IMPLANT
ELECT BLADE TIP CTD 4 INCH (ELECTRODE) ×1 IMPLANT
ELECT REM PT RETURN 15FT ADLT (MISCELLANEOUS) ×1 IMPLANT
ELIMINATOR HOLE APEX DEPUY (Hips) IMPLANT
GLOVE BIO SURGEON STRL SZ 6 (GLOVE) ×1 IMPLANT
GLOVE BIO SURGEON STRL SZ7.5 (GLOVE) ×1 IMPLANT
GLOVE BIOGEL PI IND STRL 7.5 (GLOVE) ×2 IMPLANT
GLOVE BIOGEL PI INDICATOR 7.5 (GLOVE) ×2
GLOVE INDICATOR 6.5 STRL GRN (GLOVE) ×1 IMPLANT
GOWN STRL REUS W/ TWL LRG LVL3 (GOWN DISPOSABLE) ×2 IMPLANT
GOWN STRL REUS W/TWL LRG LVL3 (GOWN DISPOSABLE) ×2
HEAD ARTICULEZE 36 12 (Hips) IMPLANT
KIT BASIN OR (CUSTOM PROCEDURE TRAY) ×1 IMPLANT
KIT TURNOVER KIT A (KITS) IMPLANT
LINER NEUTRAL 52X36MM PLUS 4 (Liner) IMPLANT
MANIFOLD NEPTUNE II (INSTRUMENTS) ×1 IMPLANT
NS IRRIG 1000ML POUR BTL (IV SOLUTION) ×1 IMPLANT
PACK TOTAL JOINT (CUSTOM PROCEDURE TRAY) ×1 IMPLANT
PROTECTOR NERVE ULNAR (MISCELLANEOUS) ×1 IMPLANT
SCREW 6.5MMX30MM (Screw) IMPLANT
SUCTION FRAZIER HANDLE 12FR (TUBING) ×1
SUCTION TUBE FRAZIER 12FR DISP (TUBING) ×1 IMPLANT
SUT STRATAFIX 0 PDS 27 VIOLET (SUTURE) ×1
SUT VIC AB 1 CT1 36 (SUTURE) ×3 IMPLANT
SUT VIC AB 2-0 CT1 27 (SUTURE) ×3
SUT VIC AB 2-0 CT1 TAPERPNT 27 (SUTURE) ×3 IMPLANT
SUTURE STRATFX 0 PDS 27 VIOLET (SUTURE) ×1 IMPLANT
TOWEL OR 17X26 10 PK STRL BLUE (TOWEL DISPOSABLE) ×2 IMPLANT
TRAY FOLEY MTR SLVR 16FR STAT (SET/KITS/TRAYS/PACK) ×1 IMPLANT
TUBE SUCTION HIGH CAP CLEAR NV (SUCTIONS) ×1 IMPLANT
WATER STERILE IRR 1000ML POUR (IV SOLUTION) ×2 IMPLANT

## 2021-11-07 NOTE — Evaluation (Signed)
Physical Therapy Evaluation Patient Details Name: Sophia Rodriguez MRN: [id number] DOB: [birth year] Today's Date: 11/07/2021  History of Present Illness  Pt is a 71yo female presenting s/p R-THA, posterior approach on 11/07/21 secondary to failed R-hemiarthroplasty. PMH: CKD, anxiety and depression, DM, DOE, HTN, hx of stroke with R-sided weakness, R-THA 2014.   Clinical Impression  Sophia Rodriguez is a 71 y.o. female POD 0 s/p R-THA, posterior approach, no precautions. Patient reports modified independence using RW for mobility at baseline. Patient is now limited by functional impairments (see PT problem list below) and requires min-mod assist for bed mobility and min assist for transfers, pt reporting grogginess, fatigue, and dizziness with soft BP, further mobility deferred, RN notified. Patient instructed in exercise to facilitate ROM and circulation to manage edema. Provided incentive spirometer and with Vcs pt able to achieve . Patient will benefit from continued skilled PT interventions to address impairments and progress towards PLOF. Acute PT will follow to progress mobility and stair training in preparation for safe discharge home.       Recommendations for follow up therapy are one component of a multi-disciplinary discharge planning process, led by the attending physician.  Recommendations may be updated based on patient status, additional functional criteria and insurance authorization.  Follow Up Recommendations Follow physician's recommendations for discharge plan and follow up therapies      Assistance Recommended at Discharge Intermittent Supervision/Assistance  Patient can return home with the following  A little help with walking and/or transfers;A little help with bathing/dressing/bathroom;Assistance with cooking/housework;Assist for transportation;Help with stairs or ramp for entrance    Equipment Recommendations None recommended by PT  Recommendations for Other Services        Functional Status Assessment Patient has had a recent decline in their functional status and demonstrates the ability to make significant improvements in function in a reasonable and predictable amount of time.     Precautions / Restrictions Precautions Precautions: Fall Restrictions Weight Bearing Restrictions: No Other Position/Activity Restrictions: wbat      Mobility  Bed Mobility Overal bed mobility: Needs Assistance Bed Mobility: Supine to Sit, Sit to Supine     Supine to sit: HOB elevated, Min assist Sit to supine: Mod assist, HOB elevated   General bed mobility comments: Supine to sit: min assist to bring BLE off bed and scoot hips EOB. Sit to supine: mod assist to bring BLE back onto bed, +2 using chuck pad to adjust in supine.    Transfers Overall transfer level: Needs assistance Equipment used: Rolling walker (2 wheels) Transfers: Sit to/from Stand Sit to Stand: From elevated surface, Min assist           General transfer comment: Pt required min assist from elevated surface for lift assist. In standing, pt reporting fatigue, dizziness, and grogginess. Returned to supine, BP taken. Further mobility deferred, RN notified.    Ambulation/Gait               General Gait Details: deferred  Stairs            Wheelchair Mobility    Modified Rankin (Stroke Patients Only)       Balance Overall balance assessment: Needs assistance Sitting-balance support: Feet supported, No upper extremity supported Sitting balance-Leahy Scale: Good     Standing balance support: Reliant on assistive device for balance, During functional activity, Bilateral upper extremity supported Standing balance-Leahy Scale: Poor  Pertinent Vitals/Pain Pain Assessment Pain Assessment: No/denies pain    Home Living Family/patient expects to be discharged to:: Private residence Living Arrangements: Alone Available Help at  Discharge: Family;Available 24 hours/day (granddaughter) Type of Home: Apartment Home Access: Level entry       Home Layout: One level Home Equipment: Agricultural consultant (2 wheels);Shower seat;Cane - single point;Scientist, research (medical) (4 wheels)      Prior Function Prior Level of Function : Independent/Modified Independent             Mobility Comments: RW for home distances, uses rollator or WC for long distance ADLs Comments: Has aide 2days a week for 2hours each, helps with housework, cleaning, chores, pt is IND for ADLS     Hand Dominance   Dominant Hand: Right    Extremity/Trunk Assessment   Upper Extremity Assessment Upper Extremity Assessment: Overall WFL for tasks assessed (RUE weakness from prior stroke)    Lower Extremity Assessment Lower Extremity Assessment: RLE deficits/detail;LLE deficits/detail RLE Deficits / Details: MMT ank DF/PF 3+/5, reduced DF ROM, weakness is present secondary to stroke history RLE Sensation: WNL LLE Deficits / Details: MMT ank DF/PF 4/5 LLE Sensation: WNL    Cervical / Trunk Assessment Cervical / Trunk Assessment: Kyphotic  Communication   Communication: Expressive difficulties  Cognition Arousal/Alertness: Lethargic Behavior During Therapy: WFL for tasks assessed/performed, Flat affect Overall Cognitive Status: No family/caregiver present to determine baseline cognitive functioning                                 General Comments: Pt has a hx of stroke, no family member to determine baseline. Very soft spoken, pleasant.        General Comments      Exercises Total Joint Exercises Ankle Circles/Pumps: AROM, Both, 10 reps   Assessment/Plan    PT Assessment Patient needs continued PT services  PT Problem List Decreased strength;Decreased range of motion;Decreased activity tolerance;Decreased balance;Decreased mobility;Decreased coordination;Pain       PT Treatment Interventions DME instruction;Gait  training;Stair training;Functional mobility training;Therapeutic activities;Therapeutic exercise;Balance training;Neuromuscular re-education;Patient/family education    PT Goals (Current goals can be found in the Care Plan section)  Acute Rehab PT Goals Patient Stated Goal: Walk without pain PT Goal Formulation: With patient Time For Goal Achievement: 11/14/21 Potential to Achieve Goals: Good    Frequency 7X/week     Co-evaluation               AM-PAC PT "6 Clicks" Mobility  Outcome Measure Help needed turning from your back to your side while in a flat bed without using bedrails?: A Little Help needed moving from lying on your back to sitting on the side of a flat bed without using bedrails?: A Little Help needed moving to and from a bed to a chair (including a wheelchair)?: A Little Help needed standing up from a chair using your arms (e.g., wheelchair or bedside chair)?: A Little Help needed to walk in hospital room?: A Little Help needed climbing 3-5 steps with a railing? : A Lot 6 Click Score: 17    End of Session Equipment Utilized During Treatment: Gait belt Activity Tolerance: Patient tolerated treatment well;No increased pain Patient left: in bed;with call bell/phone within reach;with bed alarm set;with SCD's reapplied Nurse Communication: Mobility status;Patient requests pain meds;Other (comment) (dizzy) PT Visit Diagnosis: Pain;Difficulty in walking, not elsewhere classified (R26.2) Pain - Right/Left: Right Pain - part of body: Hip  Time: 3220-2542 PT Time Calculation (min) (ACUTE ONLY): 31 min   Charges:   PT Evaluation $PT Eval Low Complexity: 1 Low PT Treatments $Therapeutic Activity: 8-22 mins        Jamesetta Geralds, PT, DPT [facility] Department Office: [phone] Pager: [phone]  Jamesetta Geralds 11/07/2021, 4:16 PM

## 2021-11-07 NOTE — Anesthesia Procedure Notes (Addendum)
Procedure Name: Intubation Date/Time: 11/07/2021 7:27 AM  Performed by: West Pugh, CRNAPre-anesthesia Checklist: Patient identified, Emergency Drugs available, Suction available, Patient being monitored and Timeout performed Patient Re-evaluated:Patient Re-evaluated prior to induction Oxygen Delivery Method: Circle system utilized Preoxygenation: Pre-oxygenation with 100% oxygen Induction Type: IV induction Ventilation: Mask ventilation without difficulty and Oral airway inserted - appropriate to patient size Laryngoscope Size: Mac and 3 Grade View: Grade I Tube type: Oral Tube size: 7.0 mm Number of attempts: 1 Airway Equipment and Method: Stylet Placement Confirmation: ETT inserted through vocal cords under direct vision, positive ETCO2, CO2 detector and breath sounds checked- equal and bilateral Secured at: 21 cm Tube secured with: Tape Dental Injury: Teeth and Oropharynx as per pre-operative assessment

## 2021-11-07 NOTE — Anesthesia Postprocedure Evaluation (Signed)
Anesthesia Post Note  Patient: Scientist, clinical (histocompatibility and immunogenetics)  Procedure(s) Performed: CONVERSION TO TOTAL HIP-POSTERIOR APPROACH (Right: Hip)     Patient location during evaluation: PACU Anesthesia Type: General Level of consciousness: awake and alert, oriented and patient cooperative Pain management: pain level controlled Vital Signs Assessment: post-procedure vital signs reviewed and stable Respiratory status: spontaneous breathing, nonlabored ventilation and respiratory function stable Cardiovascular status: blood pressure returned to baseline and stable Postop Assessment: no apparent nausea or vomiting Anesthetic complications: no   No notable events documented.  Last Vitals:  Vitals:   11/07/21 0930 11/07/21 0945  BP: 124/71 114/71  Pulse: 83 83  Resp: 13 12  Temp:    SpO2: 93% 93%    Last Pain:  Vitals:   11/07/21 0955  TempSrc:   PainSc: (P) 4                  Lannie Fields

## 2021-11-07 NOTE — Transfer of Care (Signed)
Immediate Anesthesia Transfer of Care Note  Patient: Sophia Rodriguez  Procedure(s) Performed: CONVERSION TO TOTAL HIP-POSTERIOR APPROACH (Right: Hip)  Patient Location: PACU  Anesthesia Type:General  Level of Consciousness: drowsy and patient cooperative  Airway & Oxygen Therapy: Patient Spontanous Breathing and Patient connected to face mask oxygen  Post-op Assessment: Report given to RN and Post -op Vital signs reviewed and stable  Post vital signs: Reviewed and stable  Last Vitals:  Vitals Value Taken Time  BP 132/74 11/07/21 0900  Temp    Pulse 91 11/07/21 0905  Resp 17 11/07/21 0905  SpO2 97 % 11/07/21 0905  Vitals shown include unvalidated device data.  Last Pain:  Vitals:   11/07/21 0546  TempSrc: Oral  PainSc:       Patients Stated Pain Goal: 2 (11/07/21 0538)  Complications: No notable events documented.

## 2021-11-07 NOTE — Interval H&P Note (Signed)
History and Physical Interval Note:  11/07/2021 7:10 AM  Sophia Rodriguez  has presented today for surgery, with the diagnosis of Failed right hip hemiarthroplasty.  The various methods of treatment have been discussed with the patient and family. After consideration of risks, benefits and other options for treatment, the patient has consented to  Procedure(s): CONVERSION TO TOTAL HIP-POSTERIOR APPROACH (Right) as a surgical intervention.  The patient's history has been reviewed, patient examined, no change in status, stable for surgery.  I have reviewed the patient's chart and labs.  Questions were answered to the patient's satisfaction.     [name]

## 2021-11-07 NOTE — Discharge Instructions (Addendum)

## 2021-11-07 NOTE — H&P (Signed)
CONVERSION TO TOTAL HIP ADMISSION H&P  Patient is admitted for right total hip arthroplasty.  Subjective:  Chief Complaint: right hip pain, prior hemiarthroplasty  HPI: Sophia Rodriguez, 71 y.o. female, has a history of hemiarthroplasty after hip fracture a few years ago. She has had pain in the hip for over a year now. Ultimately, the decision was made for conversion to right total hip arthroplasty for benefits of her pain and quality of life.   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   Stroke (HCC)    Hypertension    Past Medical History:  Diagnosis Date   Anxiety    Arthritis    Chronic kidney disease    related to blood pressure meds- pt reports being taken off of meds    Complication of anesthesia    slow to wake up many years ago per pt   Depression    Diabetes mellitus    hx of not on meds, not on meds in over a year    Dyspnea    with exertion   Edema    in legs per pt    Hypertension    Stroke (HCC)    x 3 weakness on right side     Past Surgical History:  Procedure Laterality Date   HIP ARTHROPLASTY Right 11/24/2012   Procedure: RIGHT HIP HEMIARTHROPLASTY;  Surgeon: Verlee Rossetti, MD;  Location: [facility];  Service: Orthopedics;  Laterality: Right;  hemiatroplasty, DePuy Triloc    Current Facility-Administered Medications  Medication Dose Route Frequency Provider Last Rate Last Admin   ceFAZolin (ANCEF) IVPB 2g/100 mL premix  2 g Intravenous On Call to OR Cassandria Anger, PA-C       dexamethasone (DECADRON) injection 8 mg  8 mg Intravenous Once Cassandria Anger, PA-C       lactated ringers infusion    Intravenous Continuous Cassandria Anger, PA-C       lactated ringers infusion   Intravenous Continuous Marcene Duos, MD 10 mL/hr at 11/07/21 2836 Continued from Pre-op at 11/07/21 6294   phenylephrine (NEOSYNEPHRINE) 20-0.9 MG/250ML-% infusion            tranexamic acid (CYKLOKAPRON) IVPB 1,000 mg  1,000 mg Intravenous To OR Cassandria Anger, PA-C       No Known Allergies  Social History   Tobacco Use   Smoking status: Former    Types: Cigarettes    Start date: 1971    Quit date: 05/28/2007    Years since quitting: 14.4   Smokeless tobacco: Never  Substance Use Topics   Alcohol use: No    Family History  Problem Relation Age of Onset   Stroke Father    Stroke Brother      Review of Systems  Constitutional:  Negative for chills and fever.  Respiratory:  Negative for cough and shortness of breath.   Cardiovascular:  Negative for chest pain.  Gastrointestinal:  Negative for nausea and vomiting.  Musculoskeletal:  Positive for arthralgias.     Objective:  Physical Exam  Well nourished and well developed. General: Alert and oriented x3, cooperative and pleasant, no acute  distress. Head: normocephalic, atraumatic, neck supple. Eyes: EOMI.  Musculoskeletal: Right hip exam: No concern for infection Tenderness over the anterior lateral aspect hip Passive range of motion produces some pain however she does have internal rotation to 20 degrees. She has terminal endpoint pain.   Calves soft and nontender. Motor function intact in LE. Strength 5/5 LE bilaterally. Neuro: Distal pulses 2+. Sensation to light touch intact in LE.  Vital signs in last 24 hours: Temp:  [98.3 F (36.8 C)] 98.3 F (36.8 C) (08/17 0546) Pulse Rate:  [87] 87 (08/17 0546) Resp:  [16] 16 (08/17 0546) BP: (118)/(73) 118/73 (08/17 0546) SpO2:  [95 %] 95 % (08/17 0546) Weight:  [77.6 kg-78 kg] 78 kg (08/17 0546)  Labs:   Estimated body mass index is 28.62 kg/m as calculated from the  following:   Height as of this encounter: 5\' 5"  (1.651 m).   Weight as of this encounter: 78 kg.   Imaging Review Imaging: AP pelvis and lateral of the right hip were ordered and reviewed. These radiographs reveal evidence of a stable femoral component without evidence of subsidence, no evidence of any healed fracture. She does have evidence of loss of joint space in the acetabulum without evidence of acute fracture.    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 The treatment options including medical management, injection therapy, arthroscopy and arthroplasty were discussed at length. The risks and benefits of total hip arthroplasty were presented and reviewed. The risks due to aseptic loosening, infection, stiffness, dislocation/subluxation,  thromboembolic complications and other imponderables were discussed.  The patient acknowledged the explanation, agreed to proceed with the plan and consent was signed. Patient is being admitted for inpatient treatment for surgery, pain control, PT, OT, prophylactic antibiotics, VTE prophylaxis, progressive ambulation and ADL's and discharge planning.The patient is planning to be discharged  home.  Therapy Plans: HEP Disposition: Home with granddaughter Planned DVT Prophylaxis: aspirin 81mg  BID DME needed: none PCP: Pulmonologist: Dr. , clearance received Cardiologist: Dr. , clearance received TXA: IV Allergies: NKDA Anesthesia Concerns: difficulty waking up BMI: 29.1 Last HgbA1c: 6.2%   Other: - hydrocodone, robaxin, tylenol    [name], PA-C Orthopedic Surgery [facility] [place] [phone]

## 2021-11-07 NOTE — Brief Op Note (Signed)
11/07/2021  8:36 AM  PATIENT:  Sophia Rodriguez  71 y.o. female  PRE-OPERATIVE DIAGNOSIS:  Failed right hip hemiarthroplasty  POST-OPERATIVE DIAGNOSIS:  Failed right hip hemiarthroplasty  PROCEDURE:  Procedure(s): CONVERSION TO TOTAL HIP-POSTERIOR APPROACH (Right)  SURGEON:  Surgeon(s) and Role:    Durene Romans, MD - Primary  PHYSICIAN ASSISTANT: Rosalene Billings, PA-C  ANESTHESIA:   general  EBL:  400 mL   BLOOD ADMINISTERED:none  DRAINS: none   LOCAL MEDICATIONS USED:  NONE  SPECIMEN:  No Specimen  DISPOSITION OF SPECIMEN:  N/A  COUNTS:  YES  TOURNIQUET:  * No tourniquets in log *  DICTATION: .Other Dictation: Dictation Number [id number]  PLAN OF CARE: Admit to inpatient   PATIENT DISPOSITION:  PACU - hemodynamically stable.   Delay start of Pharmacological VTE agent (>24hrs) due to surgical blood loss or risk of bleeding: no

## 2021-11-07 NOTE — Op Note (Signed)
Sophia Rodriguez, Sophia Rodriguez MEDICAL RECORD NO: [id number] ACCOUNT NO: [id number] DATE OF BIRTH: [birth year] FACILITY: [facility] LOCATION: [facility] PHYSICIAN: Madlyn Frankel. Charlann Boxer, MD  Operative Report   DATE OF PROCEDURE: 11/07/2021  PREOPERATIVE DIAGNOSES:  Failed right hip hemiarthroplasty due to hemiarthroplasty articulation with acetabulum and loss of cartilage.  POSTOPERATIVE DIAGNOSES:  Failed right hip hemiarthroplasty due to hemiarthroplasty articulation with acetabulum and loss of cartilage.  PROCEDURE:  Conversion of right hip hemiarthroplasty to a right total hip arthroplasty.  COMPONENTS USED:  DePuy size 52 mm Pinnacle shell with a 36+4 neutral liner and a 36+12 Articul/EZE metal head ball.  SURGEON:  Madlyn Frankel. Charlann Boxer, MD  ASSISTANT:  Rosalene Billings, PA-C.  Note that [name] was present the entirety of the case from preoperative positioning, perioperative management of the operative extremity, general facilitation of the case and primary wound closure.  ANESTHESIA:  General.  BLOOD LOSS:  About 400 mL.  DRAINS:  None.  COMPLICATIONS:  None noted.  INDICATIONS FOR THE PROCEDURE:  The patient is a 71 year old female with a history of a right hip hemiarthroplasty performed for a femoral neck fracture in 2014.  She was referred for evaluation and management of ongoing right anterior-based groin pain  following the arthroplasty.  Radiographs have indicated loss of acetabular cartilage space.  This correlated with her symptoms.  We discussed the risks of infection, DVT, dislocation, persistent pain and need for future surgeries.  We reviewed the  postoperative benefits of decreasing pain by eliminating the metal head ball articulating with her acetabulum.  Consent was obtained for benefit of pain relief.  PROCEDURE IN DETAIL:  The patient was brought to the operative theater.  Once adequate anesthesia, preoperative antibiotics, Ancef administered as well as tranexamic acid and Decadron.   She was positioned into the left lateral decubitus position with the  right hip up.  The right lower extremity was then prepped and draped in sterile fashion.  A timeout was performed identifying the patient, planned procedure, and extremity.  Her old incision appeared to be a little bit more transverse and I was worried  that I would not have adequate access to her acetabulum.  I made a standard posterior approach to her hip.  Soft tissue dissection was carried down to the iliotibial band and gluteal fascia, which were then incised.  I then worked on the dissection and  exposure of the posterior aspect of the hip.  The capsule and short external rotators, which were scarred down were taken down off the posterior aspect of the hip in a single flap style fashion.  Retractors were placed.  After further debridement, the  hip was dislocated and the femoral head was removed.  I had elevated the soft tissues off of the ilium and was able to place the trunnion onto the ilium and placed a retractor anteriorly.  With the acetabulum now exposed, I debrided soft tissues and  identified loss of articular cartilage.  We then began reaming at the area of the fovea first with a 45 mm reamer and then up to 51 mm reamer.  A 52 Pinnacle shell was then selected.  It was opened and impacted.  I used the Aufranc acetabular cup guide  to make certain that my abduction and forward flexion were appropriate.  Based on anatomic position of the component, it appeared to be in a position of 35 degrees of abduction and 20 degrees of forward flexion with a portion of the anterior rim of the  acetabulum exposed.  I placed a single screw into the ilium.  I then placed a 36+4 neutral trial liner.  At this point, we did a trial reduction first with a +5 ball, we went up to a 12 to make certain that the hip was stable in extension due to the  shuck that was present.  This may provide a little bit of length; however, the stability of the hip  was necessary.  There was no evidence of impingement with external rotation and extension nor was there evidence of subluxation with forward flexion,  internal rotation over 80 degrees.  Once I determined this, we dislocated the hip.  We removed the trial components.  A hole eliminator was placed.  The final 36+4 neutral AltrX liner was then impacted into place.  The final 36+12 Articul/EZE metal head  ball was then impacted onto a clean and dried trunnion and the hip was reduced.  We irrigated the hip throughout the case and again at this point, we reapproximated the iliotibial band and gluteal fascia using #1 Vicryl and #1 Stratafix suture.  The  remainder of the wound was closed in layers using 2-0 Vicryl and a running Monocryl stitch.  The hip was then cleaned, dried and dressed sterilely using surgical glue and Aquacel dressing.  She was brought to the recovery room in stable condition,  tolerating the procedure well.  Findings were reviewed with her daughter.  She will be weightbearing as tolerated.  We will see her back in the office in 2 weeks with likely discharge to home tomorrow.   NIK D: 11/07/2021 8:42:21 am T: 11/07/2021 9:20:00 am  JOB: [id number]/ [id number]

## 2021-11-08 ENCOUNTER — Inpatient Hospital Stay (HOSPITAL_COMMUNITY): Payer: Medicare Other

## 2021-11-08 ENCOUNTER — Encounter (HOSPITAL_COMMUNITY): Payer: Self-pay | Admitting: Orthopedic Surgery

## 2021-11-08 LAB — BASIC METABOLIC PANEL
Anion gap: 8 (ref 5–15)
BUN: 21 mg/dL (ref 8–23)
CO2: 24 mmol/L (ref 22–32)
Calcium: 8.7 mg/dL — ABNORMAL LOW (ref 8.9–10.3)
Chloride: 106 mmol/L (ref 98–111)
Creatinine, Ser: 1.66 mg/dL — ABNORMAL HIGH (ref 0.44–1.00)
GFR, Estimated: 33 mL/min — ABNORMAL LOW (ref >60)
Glucose, Bld: 178 mg/dL — ABNORMAL HIGH (ref 70–99)
Potassium: 5.4 mmol/L — ABNORMAL HIGH (ref 3.5–5.1)
Sodium: 138 mmol/L (ref 135–145)

## 2021-11-08 LAB — CBC
HCT: 31 % — ABNORMAL LOW (ref 36.0–46.0)
Hemoglobin: 9.5 g/dL — ABNORMAL LOW (ref 12.0–15.0)
MCH: 26.4 pg (ref 26.0–34.0)
MCHC: 30.6 g/dL (ref 30.0–36.0)
MCV: 86.1 fL (ref 80.0–100.0)
Platelets: 193 10*3/uL (ref 150–400)
RBC: 3.6 MIL/uL — ABNORMAL LOW (ref 3.87–5.11)
RDW: 14.1 % (ref 11.5–15.5)
WBC: 9.6 10*3/uL (ref 4.0–10.5)
nRBC: 0 % (ref 0.0–0.2)

## 2021-11-08 LAB — POTASSIUM: Potassium: 5.2 mmol/L — ABNORMAL HIGH (ref 3.5–5.1)

## 2021-11-08 MED ORDER — METHOCARBAMOL 500 MG PO TABS: 500.0000 mg | ORAL_TABLET | Freq: Four times a day (QID) | ORAL | 0 refills | Status: DC | PRN

## 2021-11-08 MED ORDER — SODIUM CHLORIDE 0.9 % IV BOLUS
500.0000 mL | Freq: Once | INTRAVENOUS | Status: AC
Start: 2021-11-08 — End: 2021-11-08
  Administered 2021-11-08: 500 mL via INTRAVENOUS

## 2021-11-08 MED ORDER — HYDROCODONE-ACETAMINOPHEN 5-325 MG PO TABS: 1.0000 | ORAL_TABLET | ORAL | 0 refills | Status: DC | PRN

## 2021-11-08 MED ORDER — SODIUM CHLORIDE 0.9 % IV BOLUS
250.0000 mL | Freq: Once | INTRAVENOUS | Status: AC
  Administered 2021-11-08: 250 mL via INTRAVENOUS

## 2021-11-08 MED ORDER — DOCUSATE SODIUM 100 MG PO CAPS: 100.0000 mg | ORAL_CAPSULE | Freq: Two times a day (BID) | ORAL | 0 refills | Status: DC

## 2021-11-08 MED ORDER — ASPIRIN 81 MG PO CHEW: 81.0000 mg | CHEWABLE_TABLET | Freq: Two times a day (BID) | ORAL | 0 refills | Status: DC

## 2021-11-08 MED ORDER — POLYETHYLENE GLYCOL 3350 17 G PO PACK: 17.0000 g | PACK | Freq: Every day | ORAL | 0 refills | Status: DC | PRN

## 2021-11-08 NOTE — Progress Notes (Signed)
PT Cancellation Note  Patient Details Name: Sophia Rodriguez MRN: [id number] DOB: January 10, 1951   Cancelled Treatment:    Reason Eval/Treat Not Completed: (P) Medical issues which prohibited therapy; pt is experiencing dizziness in combination with soft BP, RN to give bolus of fluids. Will be in communication with RN and follow up as schedule and pt status allows.    Jamesetta Geralds, PT, DPT [facility] Department Office: [phone] Pager: [phone]  Jamesetta Geralds 11/08/2021, 9:48 AM

## 2021-11-08 NOTE — Plan of Care (Signed)
  Problem: Education: Goal: Knowledge of General Education information will improve Description Including pain rating scale, medication(s)/side effects and non-pharmacologic comfort measures Outcome: Progressing   

## 2021-11-08 NOTE — Progress Notes (Signed)
Physical Therapy Treatment Patient Details Name: Sophia Rodriguez MRN: [id number] DOB: February 09, 1951 Today's Date: 11/08/2021   History of Present Illness Pt is a 71yo female presenting s/p R-THA, posterior approach on 11/07/21 secondary to failed R-hemiarthroplasty. PMH: CKD, anxiety and depression, DM, DOE, HTN, hx of stroke with R-sided weakness, R-THA 2014.    PT Comments    Pt seen for first session POD1, deferred morning session secondary to soft BP, pt receiving bolus of fluids and VSS supine in bed. Pt required min assist for bed mobility, min guard for transfer from elevated surface, and min guard for step pivot transfer to recliner. Encouraged pt to complete HEP but pt reporting too tired and too much pain. Provided ice for comfort and elevated LUE as it is present with edema. We will continue to follow acutely.     Recommendations for follow up therapy are one component of a multi-disciplinary discharge planning process, led by the attending physician.  Recommendations may be updated based on patient status, additional functional criteria and insurance authorization.  Follow Up Recommendations  Follow physician's recommendations for discharge plan and follow up therapies     Assistance Recommended at Discharge Intermittent Supervision/Assistance  Patient can return home with the following A little help with walking and/or transfers;A little help with bathing/dressing/bathroom;Assistance with cooking/housework;Assist for transportation;Help with stairs or ramp for entrance   Equipment Recommendations  None recommended by PT    Recommendations for Other Services       Precautions / Restrictions Precautions Precautions: Fall Restrictions Weight Bearing Restrictions: No Other Position/Activity Restrictions: wbat     Mobility  Bed Mobility Overal bed mobility: Needs Assistance Bed Mobility: Supine to Sit     Supine to sit: HOB elevated, Min assist     General bed mobility  comments: Supine to sit: min assist to bring RLE off bed and scoot hips EOB. Pt short of breath sitting EOB.    Transfers Overall transfer level: Needs assistance Equipment used: Rolling walker (2 wheels) Transfers: Sit to/from Stand Sit to Stand: From elevated surface, Min guard           General transfer comment: Pt required min guard from elevated surface for safety only, no physical assist required. Pt completed step pivot transfer with max encouragement, unable to lift R foot with antalgic step pattern, sliding R foot. Pt declined to complete HEP, wanted to rest, further mobility deferred.    Ambulation/Gait               General Gait Details: deferred   Stairs             Wheelchair Mobility    Modified Rankin (Stroke Patients Only)       Balance Overall balance assessment: Needs assistance Sitting-balance support: Feet supported, No upper extremity supported Sitting balance-Leahy Scale: Good     Standing balance support: Reliant on assistive device for balance, During functional activity, Bilateral upper extremity supported Standing balance-Leahy Scale: Poor                              Cognition Arousal/Alertness: Lethargic Behavior During Therapy: WFL for tasks assessed/performed, Flat affect Overall Cognitive Status: No family/caregiver present to determine baseline cognitive functioning                                 General Comments: Pt has a hx of stroke, no  family member to determine baseline. Very soft spoken, pleasant.        Exercises Total Joint Exercises Ankle Circles/Pumps: AROM, Both, 10 reps    General Comments General comments (skin integrity, edema, etc.): Pt on 2LO2 via [place]s on entry, bumped up to 4L during mobility, returned to 2L at exit, VSS.      Pertinent Vitals/Pain Pain Assessment Pain Assessment: 0-10 Pain Score: 5  Pain Location: right hip Pain Descriptors / Indicators: Operative site  guarding Pain Intervention(s): Limited activity within patient's tolerance, Monitored during session, Repositioned, Ice applied    Home Living                          Prior Function            PT Goals (current goals can now be found in the care plan section) Acute Rehab PT Goals Patient Stated Goal: Walk without pain PT Goal Formulation: With patient Time For Goal Achievement: 11/14/21 Potential to Achieve Goals: Good Progress towards PT goals: Progressing toward goals    Frequency    7X/week      PT Plan Current plan remains appropriate    Co-evaluation              AM-PAC PT "6 Clicks" Mobility   Outcome Measure  Help needed turning from your back to your side while in a flat bed without using bedrails?: A Little Help needed moving from lying on your back to sitting on the side of a flat bed without using bedrails?: A Little Help needed moving to and from a bed to a chair (including a wheelchair)?: A Little Help needed standing up from a chair using your arms (e.g., wheelchair or bedside chair)?: A Little Help needed to walk in hospital room?: A Little Help needed climbing 3-5 steps with a railing? : A Lot 6 Click Score: 17    End of Session Equipment Utilized During Treatment: Gait belt Activity Tolerance: Patient limited by fatigue;Patient limited by pain Patient left: with call bell/phone within reach;with SCD's reapplied;in chair;with chair alarm set Nurse Communication: Mobility status PT Visit Diagnosis: Pain;Difficulty in walking, not elsewhere classified (R26.2) Pain - Right/Left: Right Pain - part of body: Hip     Time: 8295-6213 PT Time Calculation (min) (ACUTE ONLY): 26 min  Charges:  $Therapeutic Activity: 23-37 mins                     Jamesetta Geralds, PT, DPT [facility] Rehabilitation Department Office: [phone] Pager: [phone]   Jamesetta Geralds 11/08/2021, 3:57 PM

## 2021-11-08 NOTE — TOC Transition Note (Signed)
Transition of Care [facility]) - CM/SW Discharge Note   Patient Details  Name: Sophia Rodriguez MRN: [id number] Date of Birth: Dec 01, 1950  Transition of Care [facility]) CM/SW Contact:  Lennart Pall, LCSW Phone Number: 11/08/2021, 10:03 AM   Clinical Narrative:     Met with pt this morning who confirms she IS in need of a RW and no agency preference - order placed with [employer] and RW delivered to room.  Plan for HEP.  No further TOC needs.  Final next level of care: Home/Self Care Barriers to Discharge: No Barriers Identified   Patient Goals and CMS Choice Patient states their goals for this hospitalization and ongoing recovery are:: return home      Discharge Placement                       Discharge Plan and Services                DME Arranged: Walker rolling DME Agency: [employer] Date DME Agency Contacted: 11/08/21   Representative spoke with at DME Agency: [place] Determinants of Health ([place]) Interventions     Readmission Risk Interventions    11/08/2021   10:02 AM  Readmission Risk Prevention Plan  Post Dischage Appt Complete  Medication Screening Complete  Transportation Screening Complete

## 2021-11-08 NOTE — Progress Notes (Addendum)
Subjective: 1 Day Post-Op Procedure(s) (LRB): CONVERSION TO TOTAL HIP-POSTERIOR APPROACH (Right) Patient reports pain as moderate.   Patient seen in rounds for Dr. Charlann Boxer. Patient is sitting on the commode when I rounded with several RNs in the room. The patient had told them that she felt a shift in her hip when she went to stand up, and they wanted me to evaluate before moving.  We will start therapy today.   Objective: Vital signs in last 24 hours: Temp:  [97.4 F (36.3 C)-98.4 F (36.9 C)] 98.1 F (36.7 C) (08/18 0540) Pulse Rate:  [78-100] 100 (08/18 0540) Resp:  [12-19] 18 (08/18 0540) BP: (89-132)/(59-79) 89/62 (08/18 0540) SpO2:  [92 %-100 %] 100 % (08/18 0540)  Intake/Output from previous day:  Intake/Output Summary (Last 24 hours) at 11/08/2021 0742 Last data filed at 11/08/2021 0600 Gross per 24 hour  Intake 2856.86 ml  Output 400 ml  Net 2456.86 ml     Intake/Output this shift: No intake/output data recorded.  Labs: Recent Labs    11/08/21 0354  HGB 9.5*   Recent Labs    11/08/21 0354  WBC 9.6  RBC 3.60*  HCT 31.0*  PLT 193   Recent Labs    11/08/21 0354  NA 138  K 5.4*  CL 106  CO2 24  BUN 21  CREATININE 1.66*  GLUCOSE 178*  CALCIUM 8.7*   No results for input(s): "LABPT", "INR" in the last 72 hours.  Exam: General - Patient is Alert and Oriented Extremity - Neurologically intact Sensation intact distally Intact pulses distally Dorsiflexion/Plantar flexion intact  I was able to gently log roll her hip, and passively internally/externally rotate in a seated position without significant discomfort. We then helped her stand and she was able to bear weight. In bed, she could tolerate log rolling again.  Dressing - dressing C/D/I Motor Function - intact, moving foot and toes well on exam.   Past Medical History:  Diagnosis Date   Anxiety    Arthritis    Chronic kidney disease    related to blood pressure meds- pt reports being taken  off of meds    Complication of anesthesia    slow to wake up many years ago per pt   Depression    Diabetes mellitus    hx of not on meds, not on meds in over a year    Dyspnea    with exertion   Edema    in legs per pt    Hypertension    Stroke (HCC)    x 3 weakness on right side     Assessment/Plan: 1 Day Post-Op Procedure(s) (LRB): CONVERSION TO TOTAL HIP-POSTERIOR APPROACH (Right) Principal Problem:   S/P total right hip arthroplasty, posterior  Estimated body mass index is 28.62 kg/m as calculated from the following:   Height as of this encounter: 5\' 5"  (1.651 m).   Weight as of this encounter: 78 kg. Advance diet Up with therapy  DVT Prophylaxis - Aspirin Weight bearing as tolerated Begin therapy  Hgb stable at 9.5 this AM. Cr. 1.66 - at baseline K slightly up at 5.4 - will repeat BP soft, will order bolus this AM.  Will order portable XR pelvis, although clinically it appears unlikely she dislocated.    Plan is to go Home after hospital stay. Will likely need to stay at least until tomorrow to continue progressing with PT. Discharge home tomorrow vs Sunday when meeting goals. Follow up in the office in  2 weeks.   Dennie Bible, PA-C Orthopedic Surgery [phone] 11/08/2021, 7:42 AM

## 2021-11-09 LAB — CBC
HCT: 25.6 % — ABNORMAL LOW (ref 36.0–46.0)
Hemoglobin: 7.9 g/dL — ABNORMAL LOW (ref 12.0–15.0)
MCH: 26.2 pg (ref 26.0–34.0)
MCHC: 30.9 g/dL (ref 30.0–36.0)
MCV: 84.8 fL (ref 80.0–100.0)
Platelets: 157 10*3/uL (ref 150–400)
RBC: 3.02 MIL/uL — ABNORMAL LOW (ref 3.87–5.11)
RDW: 14.5 % (ref 11.5–15.5)
WBC: 8.1 10*3/uL (ref 4.0–10.5)
nRBC: 0 % (ref 0.0–0.2)

## 2021-11-09 LAB — BASIC METABOLIC PANEL
Anion gap: 6 (ref 5–15)
BUN: 21 mg/dL (ref 8–23)
CO2: 23 mmol/L (ref 22–32)
Calcium: 8 mg/dL — ABNORMAL LOW (ref 8.9–10.3)
Chloride: 108 mmol/L (ref 98–111)
Creatinine, Ser: 1.55 mg/dL — ABNORMAL HIGH (ref 0.44–1.00)
GFR, Estimated: 36 mL/min — ABNORMAL LOW (ref >60)
Glucose, Bld: 152 mg/dL — ABNORMAL HIGH (ref 70–99)
Potassium: 4.5 mmol/L (ref 3.5–5.1)
Sodium: 137 mmol/L (ref 135–145)

## 2021-11-09 NOTE — Progress Notes (Signed)
Physical Therapy Treatment Patient Details Name: Sophia Rodriguez MRN: [id number] DOB: 1950/10/08 Today's Date: 11/09/2021   History of Present Illness Pt is a 71yo female presenting s/p R-THA, posterior approach on 11/07/21 secondary to failed R-hemiarthroplasty. PMH: CKD, anxiety and depression, DM, DOE, HTN, hx of stroke with R-sided weakness, R-THA 2014.    PT Comments    Pt is progressing slowly with mobility. She has not yet ambulated, she is only transferring bed to recliner, activity tolerance limited by pain and fatigue. She performed R THA exercises with min assist. She reports she does not have 24 hour assistance available at home. She would benefit from ST-SNF due to slow progress and lack of help at home.     Recommendations for follow up therapy are one component of a multi-disciplinary discharge planning process, led by the attending physician.  Recommendations may be updated based on patient status, additional functional criteria and insurance authorization.  Follow Up Recommendations  Follow physician's recommendations for discharge plan and follow up therapies (pt is progressing slowly, lacks 24/7 assistance, would benefit from ST-SNF)     Assistance Recommended at Discharge Intermittent Supervision/Assistance  Patient can return home with the following A little help with walking and/or transfers;A little help with bathing/dressing/bathroom;Assistance with cooking/housework;Assist for transportation;Help with stairs or ramp for entrance   Equipment Recommendations  None recommended by PT    Recommendations for Other Services       Precautions / Restrictions Precautions Precautions: Fall Precaution Comments: no posterior precautions Restrictions Weight Bearing Restrictions: No Other Position/Activity Restrictions: wbat, no posterior precautions     Mobility  Bed Mobility Overal bed mobility: Needs Assistance Bed Mobility: Supine to Sit     Supine to sit: HOB  elevated, Min assist     General bed mobility comments: Supine to sit: min assist to bring RLE off bed and scoot hips EOB. Initially dizzy upon sitting but this resolved within 2 minutes.    Transfers Overall transfer level: Needs assistance Equipment used: Rolling walker (2 wheels) Transfers: Sit to/from Stand, Bed to chair/wheelchair/BSC Sit to Stand: From elevated surface, Min assist   Step pivot transfers: Min guard       General transfer comment: Min A to power up from elevated bed, min/guard to take a few pivotal steps to recliner, pt declined further ambulation 2* pain/fatigue    Ambulation/Gait               General Gait Details: deferred   Stairs             Wheelchair Mobility    Modified Rankin (Stroke Patients Only)       Balance Overall balance assessment: Needs assistance Sitting-balance support: Feet supported, No upper extremity supported Sitting balance-Leahy Scale: Good     Standing balance support: Reliant on assistive device for balance, During functional activity, Bilateral upper extremity supported Standing balance-Leahy Scale: Poor                              Cognition Arousal/Alertness: Lethargic Behavior During Therapy: WFL for tasks assessed/performed, Flat affect Overall Cognitive Status: No family/caregiver present to determine baseline cognitive functioning                                 General Comments: Pt has a hx of stroke, no family member to determine baseline. Very soft spoken, pleasant. Increased time to  respond to questions.        Exercises Total Joint Exercises Ankle Circles/Pumps: AROM, Both, 10 reps Short Arc Quad: AROM, Right, 10 reps, Supine Heel Slides: AAROM, Right, 10 reps, Supine Hip ABduction/ADduction: AAROM, Right, 10 reps, Supine Long Arc Quad: AROM, Right, 10 reps, Seated    General Comments        Pertinent Vitals/Pain Pain Assessment Pain Score: 4  Pain  Location: right hip Pain Descriptors / Indicators: Grimacing, Guarding Pain Intervention(s): Limited activity within patient's tolerance, Monitored during session, Patient requesting pain meds-RN notified    Home Living                          Prior Function            PT Goals (current goals can now be found in the care plan section) Acute Rehab PT Goals Patient Stated Goal: Walk without pain PT Goal Formulation: With patient Time For Goal Achievement: 11/14/21 Potential to Achieve Goals: Good Progress towards PT goals: Progressing toward goals    Frequency    7X/week      PT Plan Discharge plan needs to be updated    Co-evaluation              AM-PAC PT "6 Clicks" Mobility   Outcome Measure  Help needed turning from your back to your side while in a flat bed without using bedrails?: A Little Help needed moving from lying on your back to sitting on the side of a flat bed without using bedrails?: A Little Help needed moving to and from a bed to a chair (including a wheelchair)?: A Little Help needed standing up from a chair using your arms (e.g., wheelchair or bedside chair)?: A Little Help needed to walk in hospital room?: Total Help needed climbing 3-5 steps with a railing? : Total 6 Click Score: 14    End of Session Equipment Utilized During Treatment: Gait belt Activity Tolerance: Patient limited by fatigue;Patient limited by pain Patient left: with call bell/phone within reach;in chair;with chair alarm set Nurse Communication: Mobility status PT Visit Diagnosis: Pain;Difficulty in walking, not elsewhere classified (R26.2) Pain - Right/Left: Right Pain - part of body: Hip     Time: 3716-9678 PT Time Calculation (min) (ACUTE ONLY): 18 min  Charges:  $Therapeutic Activity: 8-22 mins                    Ralene Bathe Kistler PT 11/09/2021  Acute Rehabilitation Services  Office [phone]

## 2021-11-09 NOTE — Progress Notes (Signed)
Physical Therapy Treatment Patient Details Name: Sophia Rodriguez MRN: [id number] DOB: [birth year] Today's Date: 11/09/2021   History of Present Illness Pt is a 71yo female presenting s/p R-THA, posterior approach on 11/07/21 secondary to failed R-hemiarthroplasty. PMH: CKD, anxiety and depression, DM, DOE, HTN, hx of stroke with R-sided weakness, R-THA 2014.    PT Comments    Assisted pt from bedside commode to bed. She was able to take several pivotal steps with a RW but unable to ambulate further 2* pain and fatigue. Reviewed THA exercises. ST-SNF recommended as pt lacks 24* assist at home. She reported that she's had 3 strokes in the past and that she was only ambulating short, household distances with a RW prior to admission.     Recommendations for follow up therapy are one component of a multi-disciplinary discharge planning process, led by the attending physician.  Recommendations may be updated based on patient status, additional functional criteria and insurance authorization.  Follow Up Recommendations  Skilled nursing-short term rehab (<3 hours/day) (pt is progressing slowly, lacks 24/7 assistance, would benefit from ST-SNF) Can patient physically be transported by private vehicle: Yes   Assistance Recommended at Discharge Intermittent Supervision/Assistance  Patient can return home with the following A little help with walking and/or transfers;A little help with bathing/dressing/bathroom;Assistance with cooking/housework;Assist for transportation;Help with stairs or ramp for entrance   Equipment Recommendations  None recommended by PT    Recommendations for Other Services       Precautions / Restrictions Precautions Precautions: Fall Precaution Comments: no posterior precautions Restrictions Weight Bearing Restrictions: No Other Position/Activity Restrictions: wbat, no posterior precautions     Mobility  Bed Mobility Overal bed mobility: Needs Assistance Bed Mobility:  Sit to Supine     Supine to sit: HOB elevated, Min assist Sit to supine: Min assist   General bed mobility comments: Min A for LEs into bed    Transfers Overall transfer level: Needs assistance Equipment used: Rolling walker (2 wheels) Transfers: Sit to/from Stand, Bed to chair/wheelchair/BSC Sit to Stand: From elevated surface, Min assist   Step pivot transfers: Min guard       General transfer comment: Min A to power up from 3 in 1, min/guard to take a few pivotal steps to bed, pt declined further ambulation 2* pain/fatigue    Ambulation/Gait               General Gait Details: deferred   Stairs             Wheelchair Mobility    Modified Rankin (Stroke Patients Only)       Balance Overall balance assessment: Needs assistance Sitting-balance support: Feet supported, No upper extremity supported Sitting balance-Leahy Scale: Good     Standing balance support: Reliant on assistive device for balance, During functional activity, Bilateral upper extremity supported Standing balance-Leahy Scale: Poor                              Cognition Arousal/Alertness: Lethargic Behavior During Therapy: WFL for tasks assessed/performed, Flat affect Overall Cognitive Status: No family/caregiver present to determine baseline cognitive functioning                                 General Comments: Pt has a hx of 3 strokes, no family member to determine baseline. Pt reports she walked short distances only PTA.  Very soft spoken, pleasant.  Increased time to respond to questions.        Exercises Total Joint Exercises Ankle Circles/Pumps: AROM, Both, 10 reps Short Arc Quad: AROM, Right, 10 reps, Supine Heel Slides: AAROM, Right, 10 reps, Supine Hip ABduction/ADduction: AAROM, Right, 10 reps, Supine Long Arc Quad: AROM, Right, 10 reps, Seated    General Comments        Pertinent Vitals/Pain Pain Assessment Pain Score: 4  Pain  Location: right hip Pain Descriptors / Indicators: Grimacing, Guarding Pain Intervention(s): Limited activity within patient's tolerance, Monitored during session, Premedicated before session, Ice applied    Home Living                          Prior Function            PT Goals (current goals can now be found in the care plan section) Acute Rehab PT Goals Patient Stated Goal: Walk without pain PT Goal Formulation: With patient Time For Goal Achievement: 11/14/21 Potential to Achieve Goals: Good Progress towards PT goals: Progressing toward goals    Frequency    7X/week      PT Plan Current plan remains appropriate    Co-evaluation              AM-PAC PT "6 Clicks" Mobility   Outcome Measure  Help needed turning from your back to your side while in a flat bed without using bedrails?: A Little Help needed moving from lying on your back to sitting on the side of a flat bed without using bedrails?: A Little Help needed moving to and from a bed to a chair (including a wheelchair)?: A Little Help needed standing up from a chair using your arms (e.g., wheelchair or bedside chair)?: A Little Help needed to walk in hospital room?: Total Help needed climbing 3-5 steps with a railing? : Total 6 Click Score: 14    End of Session Equipment Utilized During Treatment: Gait belt Activity Tolerance: Patient limited by fatigue;Patient limited by pain Patient left: with call bell/phone within reach;in bed;with bed alarm set Nurse Communication: Mobility status PT Visit Diagnosis: Pain;Difficulty in walking, not elsewhere classified (R26.2) Pain - Right/Left: Right Pain - part of body: Hip     Time: 5885-0277 PT Time Calculation (min) (ACUTE ONLY): 16 min  Charges:  $Therapeutic Activity: 8-22 mins                    Ralene Bathe Kistler PT 11/09/2021  Acute Rehabilitation Services  Office [phone]

## 2021-11-09 NOTE — Plan of Care (Signed)
  Problem: Education: Goal: Knowledge of the prescribed therapeutic regimen will improve Outcome: Progressing   Problem: Activity: Goal: Ability to avoid complications of mobility impairment will improve Outcome: Progressing   Problem: Pain Management: Goal: Pain level will decrease with appropriate interventions Outcome: Progressing   

## 2021-11-09 NOTE — Progress Notes (Signed)
Subjective: 2 Days Post-Op Procedure(s) (LRB): CONVERSION TO TOTAL HIP-POSTERIOR APPROACH (Right) Seen in rounds for Dr. Charlann Boxer Patient reports pain as 1 on 0-10 scale.   No events overnight Denies any numbness or tingling Reports generalized weakness of right lower extremity prior to surgery  Objective: Vital signs in last 24 hours: Temp:  [98.8 F (37.1 C)-99.2 F (37.3 C)] 98.8 F (37.1 C) (08/19 0457) Pulse Rate:  [90-105] 90 (08/19 0457) Resp:  [16-18] 17 (08/19 0457) BP: (104-112)/(64-68) 108/68 (08/19 0457) SpO2:  [92 %-100 %] 100 % (08/19 0457)  Intake/Output from previous day: 08/18 0701 - 08/19 0700 In: 2447.7 [P.O.:1200; I.V.:983.2; IV Piggyback:264.5] Out: 650 [Urine:650] Intake/Output this shift: No intake/output data recorded.  Recent Labs    11/08/21 0354 11/09/21 0333  HGB 9.5* 7.9*   Recent Labs    11/08/21 0354 11/09/21 0333  WBC 9.6 8.1  RBC 3.60* 3.02*  HCT 31.0* 25.6*  PLT 193 157   Recent Labs    11/08/21 0354 11/08/21 1031 11/09/21 0333  NA 138  --  137  K 5.4* 5.2* 4.5  CL 106  --  108  CO2 24  --  23  BUN 21  --  21  CREATININE 1.66*  --  1.55*  GLUCOSE 178*  --  152*  CALCIUM 8.7*  --  8.0*   No results for input(s): "LABPT", "INR" in the last 72 hours.  Neurologically intact Neurovascular intact Sensation intact distally Intact pulses distally Dorsiflexion/Plantar flexion intact Incision: dressing C/D/I Compartment soft   Assessment/Plan: 2 Days Post-Op Procedure(s) (LRB): CONVERSION TO TOTAL HIP-POSTERIOR APPROACH (Right) Up with therapy if patient is able to get up with therapy this a.m. we can hopefully discharge her today She will follow-up in the office in 2 weeks   Ceasar Mons [phone] 11/09/2021, 8:29 AM

## 2021-11-10 LAB — CBC
HCT: 28.8 % — ABNORMAL LOW (ref 36.0–46.0)
Hemoglobin: 9 g/dL — ABNORMAL LOW (ref 12.0–15.0)
MCH: 26.9 pg (ref 26.0–34.0)
MCHC: 31.3 g/dL (ref 30.0–36.0)
MCV: 86.2 fL (ref 80.0–100.0)
Platelets: 177 10*3/uL (ref 150–400)
RBC: 3.34 MIL/uL — ABNORMAL LOW (ref 3.87–5.11)
RDW: 14.6 % (ref 11.5–15.5)
WBC: 6.7 10*3/uL (ref 4.0–10.5)
nRBC: 0 % (ref 0.0–0.2)

## 2021-11-10 NOTE — Progress Notes (Signed)
Physical Therapy Treatment Patient Details Name: Sophia Rodriguez MRN: [id number] DOB: 1951/02/23 Today's Date: 11/10/2021   History of Present Illness Pt is a 71yo female presenting s/p R-THA, posterior approach on 11/07/21 secondary to failed R-hemiarthroplasty. PMH: CKD, anxiety and depression, DM, DOE, HTN, hx of stroke with R-sided weakness, R-THA 2014.    PT Comments    Pt very cooperative but requiring increased time for all tasks.  Pt requesting need for bathroom on arrival to room and assist to step pvt to [facility] and then up to ambulate limited distance in room before transferring to recliner - distance ltd by fatigue and c/o mild SOB - O2 Sat 93% on 2L.   Recommendations for follow up therapy are one component of a multi-disciplinary discharge planning process, led by the attending physician.  Recommendations may be updated based on patient status, additional functional criteria and insurance authorization.  Follow Up Recommendations  Skilled nursing-short term rehab (<3 hours/day) Can patient physically be transported by private vehicle: Yes   Assistance Recommended at Discharge Intermittent Supervision/Assistance  Patient can return home with the following A little help with walking and/or transfers;A little help with bathing/dressing/bathroom;Assistance with cooking/housework;Assist for transportation;Help with stairs or ramp for entrance   Equipment Recommendations  None recommended by PT    Recommendations for Other Services       Precautions / Restrictions Precautions Precautions: Fall Precaution Comments: no posterior precautions Restrictions Weight Bearing Restrictions: No Other Position/Activity Restrictions: wbat, no posterior precautions     Mobility  Bed Mobility Overal bed mobility: Needs Assistance Bed Mobility: Supine to Sit     Supine to sit: HOB elevated, Min assist     General bed mobility comments: INcreased time with use of bed rail and assist with  pad to complete rotation to EOB sitting    Transfers Overall transfer level: Needs assistance Equipment used: Rolling walker (2 wheels) Transfers: Sit to/from Stand, Bed to chair/wheelchair/BSC Sit to Stand: From elevated surface, Min assist   Step pivot transfers: Min assist       General transfer comment: cues for LE management and use of UEs to self assist.  UP from EOB, step pvt to [facility] and finished sitting in recliner    Ambulation/Gait Ambulation/Gait assistance: Min assist, +2 safety/equipment Gait Distance (Feet): 10 Feet Assistive device: Rolling walker (2 wheels) Gait Pattern/deviations: Step-to pattern, Decreased step length - right, Shuffle, Trunk flexed       General Gait Details: cues for sequence, posture, position from RW and increased WB on UEs with L LE swing.  Noted difficulty advancing R LE with toe dragging   Stairs             Wheelchair Mobility    Modified Rankin (Stroke Patients Only)       Balance Overall balance assessment: Needs assistance Sitting-balance support: Feet supported, No upper extremity supported Sitting balance-Leahy Scale: Good     Standing balance support: Single extremity supported Standing balance-Leahy Scale: Poor                              Cognition Arousal/Alertness: Awake/alert Behavior During Therapy: WFL for tasks assessed/performed, Flat affect Overall Cognitive Status: No family/caregiver present to determine baseline cognitive functioning                                 General Comments: Pt has a hx of  3 strokes, no family member to determine baseline. Pt reports she walked short distances only PTA.  Very soft spoken, pleasant. Increased time to respond to questions.        Exercises      General Comments General comments (skin integrity, edema, etc.): 2L O2 with sats 93% with activity      Pertinent Vitals/Pain Pain Assessment Pain Assessment: Faces Faces Pain  Scale: Hurts little more Pain Location: right hip Pain Descriptors / Indicators: Grimacing, Guarding Pain Intervention(s): Monitored during session, Limited activity within patient's tolerance, Premedicated before session    Home Living                          Prior Function            PT Goals (current goals can now be found in the care plan section) Acute Rehab PT Goals Patient Stated Goal: Walk without pain PT Goal Formulation: With patient Time For Goal Achievement: 11/14/21 Potential to Achieve Goals: Good Progress towards PT goals: Progressing toward goals    Frequency    7X/week      PT Plan Current plan remains appropriate    Co-evaluation              AM-PAC PT "6 Clicks" Mobility   Outcome Measure  Help needed turning from your back to your side while in a flat bed without using bedrails?: A Little Help needed moving from lying on your back to sitting on the side of a flat bed without using bedrails?: A Little Help needed moving to and from a bed to a chair (including a wheelchair)?: A Little Help needed standing up from a chair using your arms (e.g., wheelchair or bedside chair)?: A Little Help needed to walk in hospital room?: Total Help needed climbing 3-5 steps with a railing? : Total 6 Click Score: 14    End of Session Equipment Utilized During Treatment: Gait belt Activity Tolerance: Patient tolerated treatment well Patient left: in chair;with call bell/phone within reach;with chair alarm set Nurse Communication: Mobility status PT Visit Diagnosis: Pain;Difficulty in walking, not elsewhere classified (R26.2) Pain - Right/Left: Right Pain - part of body: Hip     Time: 6004-5997 PT Time Calculation (min) (ACUTE ONLY): 24 min  Charges:  $Gait Training: 8-22 mins $Therapeutic Activity: 8-22 mins                     Mauro Kaufmann PT Acute Rehabilitation Services Pager [phone] Office  [phone]    Meggan Dhaliwal 11/10/2021, 2:20 PM

## 2021-11-10 NOTE — Progress Notes (Signed)
Physical Therapy Treatment Patient Details Name: Sophia Rodriguez MRN: [id number] DOB: [birth year] Today's Date: 11/10/2021   History of Present Illness Pt is a 71yo female presenting s/p R-THA, posterior approach on 11/07/21 secondary to failed R-hemiarthroplasty. PMH: CKD, anxiety and depression, DM, DOE, HTN, hx of stroke with R-sided weakness, R-THA 2014.    PT Comments    Pt continues very cooperative but limited this pm by increased fatigue.  Pt assisted up to [facility] from recliner and ambulated limited distance to return to bed.    Recommendations for follow up therapy are one component of a multi-disciplinary discharge planning process, led by the attending physician.  Recommendations may be updated based on patient status, additional functional criteria and insurance authorization.  Follow Up Recommendations  Skilled nursing-short term rehab (<3 hours/day) Can patient physically be transported by private vehicle: Yes   Assistance Recommended at Discharge Intermittent Supervision/Assistance  Patient can return home with the following A little help with walking and/or transfers;A little help with bathing/dressing/bathroom;Assistance with cooking/housework;Assist for transportation;Help with stairs or ramp for entrance   Equipment Recommendations  None recommended by PT    Recommendations for Other Services       Precautions / Restrictions Precautions Precautions: Fall Precaution Comments: no posterior precautions Restrictions Weight Bearing Restrictions: No Other Position/Activity Restrictions: wbat, no posterior precautions     Mobility  Bed Mobility Overal bed mobility: Needs Assistance Bed Mobility: Sit to Supine     Supine to sit: HOB elevated, Min assist Sit to supine: Min assist, Mod assist   General bed mobility comments: Increased time with cues for sequence and with assist to manage LEs into bed    Transfers Overall transfer level: Needs assistance Equipment  used: Rolling walker (2 wheels) Transfers: Sit to/from Stand, Bed to chair/wheelchair/BSC Sit to Stand: From elevated surface, Min assist   Step pivot transfers: Min assist       General transfer comment: cues for LE management and use of UEs to self assist.  UP from recliner. step pvt to [facility] and finished sitting EOB    Ambulation/Gait Ambulation/Gait assistance: Min assist Gait Distance (Feet): 4 Feet Assistive device: Rolling walker (2 wheels) Gait Pattern/deviations: Step-to pattern, Decreased step length - right, Shuffle, Trunk flexed Gait velocity: decr     General Gait Details: cues for sequence, posture, position from RW and increased WB on UEs with L LE swing.  Noted difficulty advancing R LE with toe dragging   Stairs             Wheelchair Mobility    Modified Rankin (Stroke Patients Only)       Balance Overall balance assessment: Needs assistance Sitting-balance support: Feet supported, No upper extremity supported Sitting balance-Leahy Scale: Good     Standing balance support: Single extremity supported Standing balance-Leahy Scale: Poor                              Cognition Arousal/Alertness: Awake/alert Behavior During Therapy: WFL for tasks assessed/performed, Flat affect Overall Cognitive Status: No family/caregiver present to determine baseline cognitive functioning                                 General Comments: Pt has a hx of 3 strokes, no family member to determine baseline. Pt reports she walked short distances only PTA.  Very soft spoken, pleasant. Increased time to respond to questions.  Exercises      General Comments General comments (skin integrity, edema, etc.): 2L O2 with sats 93% with activity      Pertinent Vitals/Pain Pain Assessment Pain Assessment: 0-10 Pain Score: 5  Faces Pain Scale: Hurts little more Pain Location: right hip Pain Descriptors / Indicators: Grimacing,  Guarding Pain Intervention(s): Limited activity within patient's tolerance, Monitored during session, RN gave pain meds during session    Home Living                          Prior Function            PT Goals (current goals can now be found in the care plan section) Acute Rehab PT Goals Patient Stated Goal: Walk without pain PT Goal Formulation: With patient Time For Goal Achievement: 11/14/21 Potential to Achieve Goals: Good Progress towards PT goals: Progressing toward goals    Frequency    7X/week      PT Plan Current plan remains appropriate    Co-evaluation              AM-PAC PT "6 Clicks" Mobility   Outcome Measure  Help needed turning from your back to your side while in a flat bed without using bedrails?: A Little Help needed moving from lying on your back to sitting on the side of a flat bed without using bedrails?: A Little Help needed moving to and from a bed to a chair (including a wheelchair)?: A Little Help needed standing up from a chair using your arms (e.g., wheelchair or bedside chair)?: A Little Help needed to walk in hospital room?: Total Help needed climbing 3-5 steps with a railing? : Total 6 Click Score: 14    End of Session Equipment Utilized During Treatment: Gait belt Activity Tolerance: Patient tolerated treatment well;Patient limited by fatigue Patient left: in bed;with call bell/phone within reach;with bed alarm set;with nursing/sitter in room Nurse Communication: Mobility status PT Visit Diagnosis: Pain;Difficulty in walking, not elsewhere classified (R26.2) Pain - Right/Left: Right Pain - part of body: Hip     Time: 8127-5170 PT Time Calculation (min) (ACUTE ONLY): 20 min  Charges:  $Gait Training: 8-22 mins $Therapeutic Activity: 8-22 mins                     Mauro Kaufmann PT Acute Rehabilitation Services Pager [phone] Office [phone]    Sophia Rodriguez 11/10/2021, 4:57 PM

## 2021-11-10 NOTE — Progress Notes (Signed)
Subjective: 3 Days Post-Op Procedure(s) (LRB): CONVERSION TO TOTAL HIP-POSTERIOR APPROACH (Right) Patient reports pain as mild.   Patient seen in rounds for Dr. Charlann Boxer. Patient is resting in bed on exam this morning. She is sitting up about to eat breakfast. She appears much more lively this morning than last week, and tells me she is feeling better. She has only ambulated a few feet with PT the past few days. She tells me her original plan at home was for her granddaughter to be with her on weekends and an aide for a few days during the week, which was not what I originally understood to be the plan.    Objective: Vital signs in last 24 hours: Temp:  [97.7 F (36.5 C)-98.4 F (36.9 C)] 97.7 F (36.5 C) (08/20 0419) Pulse Rate:  [86-88] 86 (08/20 0419) Resp:  [15-17] 15 (08/20 0419) BP: (109-131)/(65-68) 109/65 (08/20 0419) SpO2:  [100 %] 100 % (08/20 0419)  Intake/Output from previous day:  Intake/Output Summary (Last 24 hours) at 11/10/2021 0943 Last data filed at 11/10/2021 0600 Gross per 24 hour  Intake 658 ml  Output 1200 ml  Net -542 ml     Intake/Output this shift: No intake/output data recorded.  Labs: Recent Labs    11/08/21 0354 11/09/21 0333  HGB 9.5* 7.9*   Recent Labs    11/08/21 0354 11/09/21 0333  WBC 9.6 8.1  RBC 3.60* 3.02*  HCT 31.0* 25.6*  PLT 193 157   Recent Labs    11/08/21 0354 11/08/21 1031 11/09/21 0333  NA 138  --  137  K 5.4* 5.2* 4.5  CL 106  --  108  CO2 24  --  23  BUN 21  --  21  CREATININE 1.66*  --  1.55*  GLUCOSE 178*  --  152*  CALCIUM 8.7*  --  8.0*   No results for input(s): "LABPT", "INR" in the last 72 hours.  Exam: General - Patient is Alert and Oriented Extremity - Neurologically intact Intact pulses distally Dorsiflexion/Plantar flexion intact Dressing - Dressing had for some reason been removed. Incision intact with dermabond in place. I cleaned the incision and re-dressed with aquacel.  Motor Function -  intact, moving foot and toes well on exam.   Past Medical History:  Diagnosis Date   Anxiety    Arthritis    Chronic kidney disease    related to blood pressure meds- pt reports being taken off of meds    Complication of anesthesia    slow to wake up many years ago per pt   Depression    Diabetes mellitus    hx of not on meds, not on meds in over a year    Dyspnea    with exertion   Edema    in legs per pt    Hypertension    Stroke (HCC)    x 3 weakness on right side     Assessment/Plan: 3 Days Post-Op Procedure(s) (LRB): CONVERSION TO TOTAL HIP-POSTERIOR APPROACH (Right) Principal Problem:   S/P total right hip arthroplasty, posterior  Estimated body mass index is 28.62 kg/m as calculated from the following:   Height as of this encounter: 5\' 5"  (1.651 m).   Weight as of this encounter: 78 kg. Advance diet Up with therapy  DVT Prophylaxis - Aspirin Weight bearing as tolerated.  ABLA: Hgb 11.8 at baseline >> 7.9 yesterday. Will recheck this AM, but based on her persistent hypotension and some dizziness, will plan to  order 1 unit PRBC for symptomatic anemia.   Discussed with patient that we will need to pursue SNF placement. It is imperative that she is self-motivated towards her rehab, as she will not have as much outside motivation at SNF to encourage her to move and walk.   Will begin SNF approval tomorrow.   Dennie Bible, PA-C Orthopedic Surgery [phone] 11/10/2021, 9:43 AM

## 2021-11-11 NOTE — Progress Notes (Signed)
Physical Therapy Treatment Patient Details Name: Sophia Rodriguez MRN: [id number] DOB: 31-Jan-1951 Today's Date: 11/11/2021   History of Present Illness Pt is a 71yo female presenting s/p R-THA, posterior approach on 11/07/21 secondary to failed R-hemiarthroplasty. PMH: CKD, anxiety and depression, DM, DOE, HTN, hx of stroke with R-sided weakness, R-THA 2014.    PT Comments    Pt ambulated 12' with RW, no loss of balance, distance limited by fatigue. Pt performed R THA exercises with assistance. Pt is pleasant and puts forth good effort. ST-SNF recommended.    Recommendations for follow up therapy are one component of a multi-disciplinary discharge planning process, led by the attending physician.  Recommendations may be updated based on patient status, additional functional criteria and insurance authorization.  Follow Up Recommendations  Skilled nursing-short term rehab (<3 hours/day) Can patient physically be transported by private vehicle: Yes   Assistance Recommended at Discharge Intermittent Supervision/Assistance  Patient can return home with the following A little help with walking and/or transfers;A little help with bathing/dressing/bathroom;Assistance with cooking/housework;Assist for transportation;Help with stairs or ramp for entrance   Equipment Recommendations  None recommended by PT    Recommendations for Other Services       Precautions / Restrictions Precautions Precautions: Fall Precaution Comments: no posterior precautions Restrictions Weight Bearing Restrictions: No Other Position/Activity Restrictions: wbat, no posterior precautions     Mobility  Bed Mobility               General bed mobility comments: up in recliner    Transfers Overall transfer level: Needs assistance Equipment used: Rolling walker (2 wheels) Transfers: Sit to/from Stand Sit to Stand: Min assist, Min guard           General transfer comment: cues for LE management and use  of UEs to self assist.    Ambulation/Gait Ambulation/Gait assistance: Min guard Gait Distance (Feet): 12 Feet Assistive device: Rolling walker (2 wheels) Gait Pattern/deviations: Step-to pattern, Decreased step length - right, Shuffle, Trunk flexed, Decreased step length - left, Decreased dorsiflexion - right, Decreased weight shift to right Gait velocity: decr     General Gait Details: pt reports h/o R foot drop and dragging RLE due to 3 prior strokes, distance limited by fatigue, no loss of balance   Stairs             Wheelchair Mobility    Modified Rankin (Stroke Patients Only)       Balance Overall balance assessment: Needs assistance Sitting-balance support: Feet supported, No upper extremity supported Sitting balance-Leahy Scale: Good     Standing balance support: Single extremity supported Standing balance-Leahy Scale: Poor                              Cognition Arousal/Alertness: Awake/alert Behavior During Therapy: WFL for tasks assessed/performed, Flat affect Overall Cognitive Status: No family/caregiver present to determine baseline cognitive functioning                                 General Comments: Pt has a hx of 3 strokes, no family member to determine baseline. Pt reports she walked short distances only PTA.  Very soft spoken, pleasant. Increased time to respond to questions.        Exercises Total Joint Exercises Ankle Circles/Pumps: AROM, Both, 10 reps Quad Sets: AROM, Right, 5 reps, Supine Short Arc Quad: AROM, Right, 10 reps, Supine  Heel Slides: AAROM, Right, 10 reps, Supine Hip ABduction/ADduction: AAROM, Right, 10 reps, Supine    General Comments        Pertinent Vitals/Pain Pain Assessment Pain Score: 5  Pain Location: right hip Pain Descriptors / Indicators: Grimacing, Guarding Pain Intervention(s): Limited activity within patient's tolerance, Monitored during session, Patient requesting pain  meds-RN notified, Ice applied    Home Living                          Prior Function            PT Goals (current goals can now be found in the care plan section) Acute Rehab PT Goals Patient Stated Goal: Walk without pain PT Goal Formulation: With patient Time For Goal Achievement: 11/14/21 Potential to Achieve Goals: Good Progress towards PT goals: Progressing toward goals    Frequency    7X/week      PT Plan Current plan remains appropriate    Co-evaluation              AM-PAC PT "6 Clicks" Mobility   Outcome Measure  Help needed turning from your back to your side while in a flat bed without using bedrails?: A Little Help needed moving from lying on your back to sitting on the side of a flat bed without using bedrails?: A Little Help needed moving to and from a bed to a chair (including a wheelchair)?: A Little Help needed standing up from a chair using your arms (e.g., wheelchair or bedside chair)?: A Little Help needed to walk in hospital room?: A Little Help needed climbing 3-5 steps with a railing? : Total 6 Click Score: 16    End of Session Equipment Utilized During Treatment: Gait belt Activity Tolerance: Patient tolerated treatment well;Patient limited by fatigue Patient left: with call bell/phone within reach;in chair;with chair alarm set;with nursing/sitter in room Nurse Communication: Mobility status PT Visit Diagnosis: Pain;Difficulty in walking, not elsewhere classified (R26.2) Pain - Right/Left: Right Pain - part of body: Hip     Time: 1740-8144 PT Time Calculation (min) (ACUTE ONLY): 15 min  Charges:  $Gait Training: 8-22 mins                     Ralene Bathe Kistler PT 11/11/2021  Acute Rehabilitation Services  Office [phone]

## 2021-11-11 NOTE — Progress Notes (Signed)
Subjective: 4 Days Post-Op Procedure(s) (LRB): CONVERSION TO TOTAL HIP-POSTERIOR APPROACH (Right) Patient reports pain as mild.   Patient seen in rounds by Dr. Charlann Boxer. Patient is well, and has had no acute complaints or problems. No acute events overnight. Minimal ambulation with PT yesterday.  We will continue therapy today.   Objective: Vital signs in last 24 hours: Temp:  [98.1 F (36.7 C)-98.9 F (37.2 C)] 98.1 F (36.7 C) (08/21 0529) Pulse Rate:  [81-92] 87 (08/21 0529) Resp:  [18] 18 (08/21 0529) BP: (109-121)/(73-78) 121/73 (08/21 0529) SpO2:  [98 %-100 %] 100 % (08/21 0529)  Intake/Output from previous day:  Intake/Output Summary (Last 24 hours) at 11/11/2021 1003 Last data filed at 11/11/2021 0600 Gross per 24 hour  Intake 300 ml  Output --  Net 300 ml     Intake/Output this shift: No intake/output data recorded.  Labs: Recent Labs    11/09/21 0333 11/10/21 1129  HGB 7.9* 9.0*   Recent Labs    11/09/21 0333 11/10/21 1129  WBC 8.1 6.7  RBC 3.02* 3.34*  HCT 25.6* 28.8*  PLT 157 177   Recent Labs    11/08/21 1031 11/09/21 0333  NA  --  137  K 5.2* 4.5  CL  --  108  CO2  --  23  BUN  --  21  CREATININE  --  1.55*  GLUCOSE  --  152*  CALCIUM  --  8.0*   No results for input(s): "LABPT", "INR" in the last 72 hours.  Exam: General - Patient is Alert and Oriented Extremity - Neurologically intact Sensation intact distally Intact pulses distally Dorsiflexion/Plantar flexion intact Dressing - dressing C/D/I Motor Function - intact, moving foot and toes well on exam.   Past Medical History:  Diagnosis Date   Anxiety    Arthritis    Chronic kidney disease    related to blood pressure meds- pt reports being taken off of meds    Complication of anesthesia    slow to wake up many years ago per pt   Depression    Diabetes mellitus    hx of not on meds, not on meds in over a year    Dyspnea    with exertion   Edema    in legs per pt     Hypertension    Stroke (HCC)    x 3 weakness on right side     Assessment/Plan: 4 Days Post-Op Procedure(s) (LRB): CONVERSION TO TOTAL HIP-POSTERIOR APPROACH (Right) Principal Problem:   S/P total right hip arthroplasty, posterior  Estimated body mass index is 28.62 kg/m as calculated from the following:   Height as of this encounter: 5\' 5"  (1.651 m).   Weight as of this encounter: 78 kg. Advance diet Up with therapy  Anticipated LOS equal to or greater than 2 midnights due to - Age 24 and older with one or more of the following:  - Obesity  - Expected need for hospital services (PT, OT, Nursing) required for safe  discharge  - Anticipated need for postoperative skilled nursing care or inpatient rehab  - Active co-morbidities: Stroke OR   - Unanticipated findings during/Post Surgery: Slow post-op progression: GI, pain control, mobility  - Patient is a high risk of re-admission due to: None   DVT Prophylaxis - Aspirin Weight bearing as tolerated.  Hgb of 7.9 must have been erroneous, yesterday it was repeated at 9.0 - no need for blood transfusion at this point  Continue working  with PT.  Pursuing SNF placement unless she makes significant progression  Dennie Bible, PA-C Orthopedic Surgery [phone] 11/11/2021, 10:03 AM

## 2021-11-11 NOTE — Progress Notes (Signed)
Physical Therapy Treatment Patient Details Name: Sophia Rodriguez MRN: [id number] DOB: [birth year] Today's Date: 11/11/2021   History of Present Illness Pt is a 71yo female presenting s/p R-THA, posterior approach on 11/07/21 secondary to failed R-hemiarthroplasty. PMH: CKD, anxiety and depression, DM, DOE, HTN, hx of stroke with R-sided weakness, R-THA 2014.    PT Comments    Pt ambulated 8' with RW, no loss of balance, distance limited by fatigue and pain. Noted R foot drop and pt drags RLE to advance it forward, pt stated this is baseline due to h/o 3 CVAs. ST-SNF remains appropriate DC venue.   Recommendations for follow up therapy are one component of a multi-disciplinary discharge planning process, led by the attending physician.  Recommendations may be updated based on patient status, additional functional criteria and insurance authorization.  Follow Up Recommendations  Skilled nursing-short term rehab (<3 hours/day) Can patient physically be transported by private vehicle: Yes   Assistance Recommended at Discharge Intermittent Supervision/Assistance  Patient can return home with the following A little help with walking and/or transfers;A little help with bathing/dressing/bathroom;Assistance with cooking/housework;Assist for transportation;Help with stairs or ramp for entrance   Equipment Recommendations  None recommended by PT    Recommendations for Other Services       Precautions / Restrictions Precautions Precautions: Fall Precaution Comments: no posterior precautions Restrictions Weight Bearing Restrictions: No Other Position/Activity Restrictions: wbat, no posterior precautions     Mobility  Bed Mobility               General bed mobility comments: up in recliner    Transfers Overall transfer level: Needs assistance Equipment used: Rolling walker (2 wheels) Transfers: Sit to/from Stand Sit to Stand: Min assist           General transfer comment: cues  for LE management and use of UEs to self assist.  UP from recliner    Ambulation/Gait Ambulation/Gait assistance: Min guard Gait Distance (Feet): 8 Feet Assistive device: Rolling walker (2 wheels) Gait Pattern/deviations: Step-to pattern, Decreased step length - right, Shuffle, Trunk flexed, Decreased step length - left, Decreased dorsiflexion - right, Decreased weight shift to right Gait velocity: decr     General Gait Details: pt reports h/o R foot drop and dragging RLE due to 3 prior strokes, distance limited by fatigue, 2/4 dyspnea with walking, SaO2 91% on room air with walking   Stairs             Wheelchair Mobility    Modified Rankin (Stroke Patients Only)       Balance Overall balance assessment: Needs assistance Sitting-balance support: Feet supported, No upper extremity supported Sitting balance-Leahy Scale: Good     Standing balance support: Single extremity supported Standing balance-Leahy Scale: Poor                              Cognition Arousal/Alertness: Awake/alert Behavior During Therapy: WFL for tasks assessed/performed, Flat affect Overall Cognitive Status: No family/caregiver present to determine baseline cognitive functioning                                 General Comments: Pt has a hx of 3 strokes, no family member to determine baseline. Pt reports she walked short distances only PTA.  Very soft spoken, pleasant. Increased time to respond to questions.        Exercises Total Joint Exercises Ankle  Circles/Pumps: AROM, Both, 10 reps Heel Slides: AAROM, Right, 10 reps, Supine Hip ABduction/ADduction: AAROM, Right, 10 reps, Supine    General Comments        Pertinent Vitals/Pain Pain Assessment Pain Score: 4  Pain Location: right hip Pain Descriptors / Indicators: Grimacing, Guarding Pain Intervention(s): Limited activity within patient's tolerance, Monitored during session, Premedicated before session (pt  declined ice)    Home Living                          Prior Function            PT Goals (current goals can now be found in the care plan section) Acute Rehab PT Goals Patient Stated Goal: Walk without pain PT Goal Formulation: With patient Time For Goal Achievement: 11/14/21 Potential to Achieve Goals: Good Progress towards PT goals: Progressing toward goals    Frequency    7X/week      PT Plan Current plan remains appropriate    Co-evaluation              AM-PAC PT "6 Clicks" Mobility   Outcome Measure  Help needed turning from your back to your side while in a flat bed without using bedrails?: A Little Help needed moving from lying on your back to sitting on the side of a flat bed without using bedrails?: A Little Help needed moving to and from a bed to a chair (including a wheelchair)?: A Little Help needed standing up from a chair using your arms (e.g., wheelchair or bedside chair)?: A Little Help needed to walk in hospital room?: A Little Help needed climbing 3-5 steps with a railing? : Total 6 Click Score: 16    End of Session Equipment Utilized During Treatment: Gait belt Activity Tolerance: Patient tolerated treatment well;Patient limited by fatigue Patient left: with call bell/phone within reach;in chair;with chair alarm set Nurse Communication: Mobility status PT Visit Diagnosis: Pain;Difficulty in walking, not elsewhere classified (R26.2) Pain - Right/Left: Right Pain - part of body: Hip     Time: 7564-3329 PT Time Calculation (min) (ACUTE ONLY): 16 min  Charges:  $Gait Training: 8-22 mins                    Ralene Bathe Kistler PT 11/11/2021  Acute Rehabilitation Services  Office [phone]

## 2021-11-11 NOTE — TOC Progression Note (Addendum)
Transition of Care [facility]) - Progression Note    Patient Details  Name: Sophia Rodriguez MRN: [id number] Date of Birth: 17-Feb-1951  [facility]) CM/SW Contact  Lennart Pall, LCSW Phone Number: 11/11/2021, 10:03 AM  Clinical Narrative:    Received new TOC orders to assist with SNF placement.  Met with pt and discussed this recommendation from therapies and she is in agreement with plan.  Have left a VM for her daughter to confirm family agreement and will begin SNF bed search and insurance authorization.  Addendum:  Have reviewed SNF bed offers with pt and daughter and bed accepted at [facility] for admission tomorrow.  Have alerted PA.  Have received insurance auth.   Barriers to Discharge: No Barriers Identified  Expected Discharge Plan and Services           Expected Discharge Date: 11/11/21               DME Arranged: Gilford Rile rolling DME Agency: [employer] Date DME Agency Contacted: 11/08/21   Representative spoke with at DME Agency: [place] Determinants of Health ([place]) Interventions    Readmission Risk Interventions    11/08/2021   10:02 AM  Readmission Risk Prevention Plan  Post Dischage Appt Complete  Medication Screening Complete  Transportation Screening Complete

## 2021-11-11 NOTE — NC FL2 (Signed)
[place] MEDICAID FL2 LEVEL OF CARE SCREENING TOOL     IDENTIFICATION  Patient Name: Sophia Rodriguez Birthdate: 05-25-1950 Sex: female Admission Date (Current Location): 11/07/2021  [facility] and [place] Number:  Producer, television/film/video and Address:  [facility],  [address] 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   Right hemiparesis (HCC) 05/10/2014   Abnormality of gait 05/10/2014   Closed fracture of right hip (HCC) 11/24/2012   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   Stroke (HCC)    Hypertension     Orientation RESPIRATION BLADDER Height & Weight     Self, Time, Situation, Place  Normal Continent Weight: 172 lb (78 kg) Height:  5\' 5"  (165.1 cm)  BEHAVIORAL SYMPTOMS/MOOD NEUROLOGICAL BOWEL NUTRITION STATUS      Continent Diet (regular)  AMBULATORY STATUS COMMUNICATION OF NEEDS Skin   Extensive Assist Verbally Other (Comment) (surgical incision only)                       Personal Care Assistance Level of Assistance  Bathing, Dressing Bathing Assistance: Limited assistance   Dressing Assistance: Limited assistance     Functional Limitations Info             SPECIAL CARE FACTORS FREQUENCY  PT (By licensed PT), OT (By licensed OT)     PT Frequency: 5x/wk OT Frequency: 5x/wk             Contractures Contractures Info: Not present    Additional Factors Info  Code Status, Allergies, Psychotropic Code Status Info: Full Allergies Info: NKDA Psychotropic Info: see MAR         Current Medications (11/11/2021):  This is the current hospital active medication list Current Facility-Administered Medications  Medication Dose Route Frequency Provider Last Rate Last Admin   0.9 %  sodium chloride infusion   Intravenous Continuous 11/13/2021, PA-C 75 mL/hr at 11/09/21 0531 New Bag at 11/09/21 0531   acetaminophen (TYLENOL) tablet 325-650 mg  325-650 mg Oral Q6H PRN 11/11/21, PA-C   650 mg at 11/09/21 11/11/21   aspirin chewable tablet 81 mg  81 mg Oral BID 9211, PA-C   81 mg at 11/11/21 11/13/21   atorvastatin (LIPITOR) tablet 40 mg  40 mg Oral 9417 [id number], PA-C   40 mg at 11/10/21 1628   bisacodyl (DULCOLAX) suppository 10 mg  10 mg Rectal Daily PRN 11/12/21, PA-C       citalopram (CELEXA) tablet 10 mg  10 mg Oral Daily Cassandria Anger, PA-C   10 mg at 11/11/21 11/13/21   diphenhydrAMINE (BENADRYL) 12.5 MG/5ML elixir 12.5-25 mg  12.5-25 mg Oral Q4H PRN 04-23-1984, PA-C       docusate sodium (COLACE) capsule 100 mg  100 mg Oral BID Cassandria Anger  R, PA-C   100 mg at 11/11/21 0925   escitalopram (LEXAPRO) tablet 20 mg  20 mg Oral Daily Cassandria Anger, PA-C   20 mg at 11/11/21 6644   ferrous sulfate tablet 325 mg  325 mg Oral TID PC Cassandria Anger, PA-C   325 mg at 11/11/21 0347   gabapentin (NEURONTIN) capsule 100 mg  100 mg Oral TID Cassandria Anger, PA-C   100 mg at 11/11/21 4259   HYDROcodone-acetaminophen (NORCO) 7.5-325 MG per tablet 1-2 tablet  1-2 tablet Oral Q4H PRN Cassandria Anger, PA-C   1 tablet at 11/10/21 2125   HYDROcodone-acetaminophen (NORCO/VICODIN) 5-325 MG per tablet 1-2 tablet  1-2 tablet Oral Q4H PRN Cassandria Anger, PA-C   1 tablet at 11/11/21 5638   loratadine (CLARITIN) tablet 10 mg  10 mg Oral Daily  Cassandria Anger, PA-C   10 mg at 11/11/21 7564   losartan (COZAAR) tablet 25 mg  25 mg Oral Daily Cassandria Anger, PA-C   25 mg at 11/10/21 1049   menthol-cetylpyridinium (CEPACOL) lozenge 3 mg  1 lozenge Oral PRN Cassandria Anger, PA-C       Or   phenol (CHLORASEPTIC) mouth spray 1 spray  1 spray Mouth/Throat PRN Cassandria Anger, PA-C       methocarbamol (ROBAXIN) tablet 500 mg  500 mg Oral Q6H PRN Cassandria Anger, PA-C       Or   methocarbamol (ROBAXIN) 500 mg in dextrose 5 % 50 mL IVPB  500 mg Intravenous Q6H PRN Cassandria Anger, PA-C   Stopped at 11/07/21 1015   metoCLOPramide (REGLAN) tablet 5-10 mg  5-10 mg Oral Q8H PRN Cassandria Anger, PA-C       Or   metoCLOPramide (REGLAN) injection 5-10 mg  5-10 mg Intravenous Q8H PRN Cassandria Anger, PA-C       morphine (PF) 2 MG/ML injection 0.5-1 mg  0.5-1 mg Intravenous Q2H PRN Cassandria Anger, PA-C       ondansetron [facility]) tablet 4 mg  4 mg Oral Q6H PRN Cassandria Anger, PA-C       Or   ondansetron [facility]) injection 4 mg  4 mg Intravenous Q6H PRN Rosalene Billings R, PA-C   4 mg at 11/08/21 0035   polyethylene glycol (MIRALAX / GLYCOLAX) packet 17 g  17 g Oral Daily PRN Cassandria Anger, PA-C       zolpidem (AMBIEN) tablet 5 mg  5 mg Oral QHS PRN Cassandria Anger, PA-C   5 mg at 11/10/21 2123     Discharge Medications: Please see discharge summary for a list of discharge medications.  Relevant Imaging Results:  Relevant Lab Results:   Additional Information SS# [id number]  Amada Jupiter, LCSW

## 2021-11-12 MED ORDER — POLYETHYLENE GLYCOL 3350 17 G PO PACK: 17.0000 g | PACK | Freq: Every day | ORAL | 0 refills | Status: DC | PRN

## 2021-11-12 MED ORDER — HYDROCODONE-ACETAMINOPHEN 5-325 MG PO TABS
1.0000 | ORAL_TABLET | Freq: Four times a day (QID) | ORAL | 0 refills | Status: DC | PRN
Start: 2021-11-12 — End: 2022-12-01

## 2021-11-12 MED ORDER — DOCUSATE SODIUM 100 MG PO CAPS: 100.0000 mg | ORAL_CAPSULE | Freq: Two times a day (BID) | ORAL | 0 refills | Status: AC

## 2021-11-12 MED ORDER — ASPIRIN 81 MG PO CHEW: 81.0000 mg | CHEWABLE_TABLET | Freq: Two times a day (BID) | ORAL | 0 refills | Status: AC

## 2021-11-12 MED ORDER — METHOCARBAMOL 500 MG PO TABS: 500.0000 mg | ORAL_TABLET | Freq: Four times a day (QID) | ORAL | 0 refills | Status: DC | PRN

## 2021-11-12 NOTE — Plan of Care (Signed)
  Problem: Education: Goal: Knowledge of the prescribed therapeutic regimen will improve Outcome: Completed/Met Goal: Understanding of discharge needs will improve Outcome: Completed/Met Goal: Individualized Educational Video(s) Outcome: Completed/Met   Problem: Activity: Goal: Ability to avoid complications of mobility impairment will improve Outcome: Completed/Met Goal: Ability to tolerate increased activity will improve Outcome: Completed/Met   Problem: Clinical Measurements: Goal: Postoperative complications will be avoided or minimized Outcome: Completed/Met   Problem: Pain Management: Goal: Pain level will decrease with appropriate interventions Outcome: Completed/Met   Problem: Skin Integrity: Goal: Will show signs of wound healing Outcome: Completed/Met   Problem: Education: Goal: Knowledge of General Education information will improve Description: Including pain rating scale, medication(s)/side effects and non-pharmacologic comfort measures Outcome: Completed/Met   Problem: Health Behavior/Discharge Planning: Goal: Ability to manage health-related needs will improve Outcome: Completed/Met   Problem: Clinical Measurements: Goal: Ability to maintain clinical measurements within normal limits will improve Outcome: Completed/Met Goal: Will remain free from infection Outcome: Completed/Met Goal: Diagnostic test results will improve Outcome: Completed/Met Goal: Respiratory complications will improve Outcome: Completed/Met Goal: Cardiovascular complication will be avoided Outcome: Completed/Met   Problem: Activity: Goal: Risk for activity intolerance will decrease Outcome: Completed/Met   Problem: Nutrition: Goal: Adequate nutrition will be maintained Outcome: Completed/Met   Problem: Coping: Goal: Level of anxiety will decrease Outcome: Completed/Met   Problem: Elimination: Goal: Will not experience complications related to bowel motility Outcome:  Completed/Met Goal: Will not experience complications related to urinary retention Outcome: Completed/Met   Problem: Pain Managment: Goal: General experience of comfort will improve Outcome: Completed/Met   Problem: Safety: Goal: Ability to remain free from injury will improve Outcome: Completed/Met   Problem: Skin Integrity: Goal: Risk for impaired skin integrity will decrease Outcome: Completed/Met

## 2021-11-12 NOTE — Progress Notes (Signed)
Discharge instructions explained to patient and provided to EMS transport clinician, Maisie Fus. Attempted to call report at [facility] skilled nursing facility. Left message for return call. All questions answered for patient and transport team.

## 2021-11-12 NOTE — Progress Notes (Signed)
   Subjective: 5 Days Post-Op Procedure(s) (LRB): CONVERSION TO TOTAL HIP-POSTERIOR APPROACH (Right) Patient reports pain as  minimal .   Patient seen in rounds for Dr. Charlann Boxer. Patient is resting in bed on exam this morning. She is going to [employer] today. No acute events overnight. Ambulated better with PT yesterday. We will continue therapy today.   Objective: Vital signs in last 24 hours: Temp:  [97.9 F (36.6 C)-98.6 F (37 C)] 97.9 F (36.6 C) (08/22 0627) Pulse Rate:  [77-92] 83 (08/22 0627) Resp:  [16-18] 18 (08/22 0627) BP: (115-141)/(66-73) 141/73 (08/22 0627) SpO2:  [93 %-100 %] 100 % (08/22 0627)  Intake/Output from previous day:  Intake/Output Summary (Last 24 hours) at 11/12/2021 0702 Last data filed at 11/12/2021 0600 Gross per 24 hour  Intake 960 ml  Output --  Net 960 ml     Intake/Output this shift: No intake/output data recorded.  Labs: Recent Labs    11/10/21 1129  HGB 9.0*   Recent Labs    11/10/21 1129  WBC 6.7  RBC 3.34*  HCT 28.8*  PLT 177   No results for input(s): "NA", "K", "CL", "CO2", "BUN", "CREATININE", "GLUCOSE", "CALCIUM" in the last 72 hours. No results for input(s): "LABPT", "INR" in the last 72 hours.  Exam: General - Patient is Alert and Oriented Extremity - Neurologically intact Sensation intact distally Intact pulses distally Dorsiflexion/Plantar flexion intact Dressing - dressing C/D/I Motor Function - intact, moving foot and toes well on exam.   Past Medical History:  Diagnosis Date   Anxiety    Arthritis    Chronic kidney disease    related to blood pressure meds- pt reports being taken off of meds    Complication of anesthesia    slow to wake up many years ago per pt   Depression    Diabetes mellitus    hx of not on meds, not on meds in over a year    Dyspnea    with exertion   Edema    in legs per pt    Hypertension    Stroke (HCC)    x 3 weakness on right side     Assessment/Plan: 5 Days Post-Op  Procedure(s) (LRB): CONVERSION TO TOTAL HIP-POSTERIOR APPROACH (Right) Principal Problem:   S/P total right hip arthroplasty, posterior  Estimated body mass index is 28.62 kg/m as calculated from the following:   Height as of this encounter: 5\' 5"  (1.651 m).   Weight as of this encounter: 78 kg. Advance diet Up with therapy D/C IV fluids  DVT Prophylaxis - Aspirin Weight bearing as tolerated.  Plan is to go Skilled nursing facility after hospital stay. Plan for discharge today after meeting goals with therapy. Follow up in the office in 1 week.    , PA-C Orthopedic Surgery [phone] 11/12/2021, 7:02 AM

## 2021-11-12 NOTE — Care Management Important Message (Signed)
Important Message  Patient Details IM Letter given to the Patient. Name: Sophia Rodriguez MRN: [id number] Date of Birth: 07-19-1950   Medicare Important Message Given:  Yes     Caren Macadam 11/12/2021, 10:43 AM

## 2021-11-12 NOTE — Progress Notes (Signed)
Physical Therapy Treatment Patient Details Name: Sophia Rodriguez MRN: [id number] DOB: [birth year] Today's Date: 11/12/2021   History of Present Illness Pt is a 71yo female presenting s/p R-THA, posterior approach on 11/07/21 secondary to failed R-hemiarthroplasty. PMH: CKD, anxiety and depression, DM, DOE, HTN, hx of stroke with R-sided weakness, R-THA 2014.    PT Comments    Progressing slowly with mobility. Pt was able to ambulate to and from the bathroom on today. She c/o some dizziness after rising from the commode. Assessed BP after walking back to her recliner: BP 144/71, O2 95% on RA, HR 93 bpm. Plan is for d/c to SNF on today.     Recommendations for follow up therapy are one component of a multi-disciplinary discharge planning process, led by the attending physician.  Recommendations may be updated based on patient status, additional functional criteria and insurance authorization.  Follow Up Recommendations  Skilled nursing-short term rehab (<3 hours/day) Can patient physically be transported by private vehicle: Yes   Assistance Recommended at Discharge Frequent or constant Supervision/Assistance  Patient can return home with the following A little help with walking and/or transfers;A little help with bathing/dressing/bathroom;Assistance with cooking/housework;Assist for transportation;Help with stairs or ramp for entrance   Equipment Recommendations  None recommended by PT    Recommendations for Other Services       Precautions / Restrictions Precautions Precautions: Fall Precaution Comments: no posterior precautions Restrictions Weight Bearing Restrictions: No RLE Weight Bearing: Weight bearing as tolerated     Mobility  Bed Mobility               General bed mobility comments: up in recliner    Transfers Overall transfer level: Needs assistance Equipment used: Rolling walker (2 wheels) Transfers: Sit to/from Stand Sit to Stand: Min assist            General transfer comment: Assist to rise, steady, control descent. x 2-once from recliner, once from [facility] over toilet. Increased time. Cues for safety, technique, hand placement. Pt reports having dizziness with initial standing that does resolve with some time.    Ambulation/Gait Ambulation/Gait assistance: Min guard, Min assist Gait Distance (Feet): 10 Feet (x2) Assistive device: Rolling walker (2 wheels) Gait Pattern/deviations: Step-to pattern, Decreased step length - right, Shuffle, Trunk flexed, Decreased step length - left, Decreased dorsiflexion - right, Decreased weight shift to right, Decreased stride length       General Gait Details: Cues for safety, RW proximity, R LE step length. Intermittent assist to manage RW. R foot drop with R LE tending to lag behind. Slow gait speed.   Stairs             Wheelchair Mobility    Modified Rankin (Stroke Patients Only)       Balance Overall balance assessment: Needs assistance         Standing balance support: Bilateral upper extremity supported, Reliant on assistive device for balance, During functional activity Standing balance-Leahy Scale: Poor                              Cognition Arousal/Alertness: Awake/alert Behavior During Therapy: WFL for tasks assessed/performed Overall Cognitive Status: No family/caregiver present to determine baseline cognitive functioning                                 General Comments: Pt has a hx of 3 strokes, no  family member to determine baseline. Soft spoken, pleasant. Increased time to respond to questions at times. Appropriate.        Exercises      General Comments        Pertinent Vitals/Pain Pain Assessment Pain Assessment: 0-10 Pain Score: 7  Pain Location: R hip Pain Descriptors / Indicators: Grimacing, Guarding Pain Intervention(s): Limited activity within patient's tolerance, Monitored during session, Repositioned    Home Living                           Prior Function            PT Goals (current goals can now be found in the care plan section) Progress towards PT goals: Progressing toward goals    Frequency    7X/week      PT Plan Current plan remains appropriate    Co-evaluation              AM-PAC PT "6 Clicks" Mobility   Outcome Measure  Help needed turning from your back to your side while in a flat bed without using bedrails?: A Little Help needed moving from lying on your back to sitting on the side of a flat bed without using bedrails?: A Little Help needed moving to and from a bed to a chair (including a wheelchair)?: A Little Help needed standing up from a chair using your arms (e.g., wheelchair or bedside chair)?: A Little Help needed to walk in hospital room?: A Little Help needed climbing 3-5 steps with a railing? : A Lot 6 Click Score: 17    End of Session Equipment Utilized During Treatment: Gait belt Activity Tolerance: Patient limited by fatigue;Patient limited by pain;Patient tolerated treatment well Patient left: in chair;with call bell/phone within reach   PT Visit Diagnosis: Pain;Difficulty in walking, not elsewhere classified (R26.2) Pain - Right/Left: Right Pain - part of body: Hip     Time: 9480-1655 PT Time Calculation (min) (ACUTE ONLY): 20 min  Charges:  $Gait Training: 8-22 mins                        Faye Ramsay, PT Acute Rehabilitation  Office: [phone] Pager: [phone]

## 2021-11-12 NOTE — Discharge Summary (Signed)
Physician Discharge Summary   Patient ID: Sophia Rodriguez MRN: [id number] DOB/AGE: 70-Jun-1952 70 y.o.  Admit date: 11/07/2021 Discharge date: 11/12/2021  Primary Diagnosis: Failed right hemiarthroplasty  Admission Diagnoses:  Past Medical History:  Diagnosis Date   Anxiety    Arthritis    Chronic kidney disease    related to blood pressure meds- pt reports being taken off of meds    Complication of anesthesia    slow to wake up many years ago per pt   Depression    Diabetes mellitus    hx of not on meds, not on meds in over a year    Dyspnea    with exertion   Edema    in legs per pt    Hypertension    Stroke ([place])    x 3 weakness on right side    Discharge Diagnoses:   Principal Problem:   S/P total right hip arthroplasty, posterior  Estimated body mass index is 28.62 kg/m as calculated from the following:   Height as of this encounter: _0  (1.651 m).   Weight as of this encounter: 78 kg.  Procedure:  Procedure(s) (LRB): CONVERSION TO TOTAL HIP-POSTERIOR APPROACH (Right)   Consults: None  HPI:  The patient is a 71 year old female with a history of a right hip hemiarthroplasty performed for a femoral neck fracture in 2014.  She was referred for evaluation and management of ongoing right anterior-based groin pain  following the arthroplasty.  Radiographs have indicated loss of acetabular cartilage space.  This correlated with her symptoms.  We discussed the risks of infection, DVT, dislocation, persistent pain and need for future surgeries.  We reviewed the  postoperative benefits of decreasing pain by eliminating the metal head ball articulating with her acetabulum.  Consent was obtained for benefit of pain relief.  Laboratory Data: Admission on 11/07/2021  Component Date Value Ref Range Status   Glucose-Capillary 11/07/2021 119 (H)  70 - 99 mg/dL Final   Glucose reference range applies only to samples taken after fasting for at least 8 hours.   Comment 1  11/07/2021 Document in Chart   Final   WBC 11/08/2021 9.6  4.0 - 10.5 K/uL Final   RBC 11/08/2021 3.60 (L)  3.87 - 5.11 MIL/uL Final   Hemoglobin 11/08/2021 9.5 (L)  12.0 - 15.0 g/dL Final   HCT 11/08/2021 31.0 (L)  36.0 - 46.0 % Final   MCV 11/08/2021 86.1  80.0 - 100.0 fL Final   MCH 11/08/2021 26.4  26.0 - 34.0 pg Final   MCHC 11/08/2021 30.6  30.0 - 36.0 g/dL Final   RDW 11/08/2021 14.1  11.5 - 15.5 % Final   Platelets 11/08/2021 193  150 - 400 K/uL Final   nRBC 11/08/2021 0.0  0.0 - 0.2 % Final   Performed at [facility], [place] [address], [place], [place] [address]   Sodium 11/08/2021 138  135 - 145 mmol/L Final   Potassium 11/08/2021 5.4 (H)  3.5 - 5.1 mmol/L Final   Chloride 11/08/2021 106  98 - 111 mmol/L Final   CO2 11/08/2021 24  22 - 32 mmol/L Final   Glucose, Bld 11/08/2021 178 (H)  70 - 99 mg/dL Final   Glucose reference range applies only to samples taken after fasting for at least 8 hours.   BUN 11/08/2021 21  8 - 23 mg/dL Final   Creatinine, Ser 11/08/2021 1.66 (H)  0.44 - 1.00 mg/dL Final   Calcium 11/08/2021 8.7 (L)  8.9 - 10.3 mg/dL Final   GFR, Estimated 11/08/2021 33 (L)  >60 mL/min Final   Comment: (NOTE) Calculated using the CKD-EPI Creatinine Equation (2021)    Anion gap 11/08/2021 8  5 - 15 Final   Performed at [facility], [place] [address], [place], [place] [address]   Potassium 11/08/2021 5.2 (H)  3.5 - 5.1 mmol/L Final   Performed at [place] [address], [place], [place] [address]   WBC 11/09/2021 8.1  4.0 - 10.5 K/uL Final   RBC 11/09/2021 3.02 (L)  3.87 - 5.11 MIL/uL Final   Hemoglobin 11/09/2021 7.9 (L)  12.0 - 15.0 g/dL Final   HCT 11/09/2021 25.6 (L)  36.0 - 46.0 % Final   MCV 11/09/2021 84.8  80.0 - 100.0 fL Final   MCH 11/09/2021 26.2  26.0 - 34.0 pg Final   MCHC 11/09/2021 30.9  30.0 - 36.0 g/dL Final   RDW 11/09/2021 14.5  11.5 - 15.5 % Final   Platelets 11/09/2021 157  150 - 400  K/uL Final   nRBC 11/09/2021 0.0  0.0 - 0.2 % Final   Performed at [facility], [place] [address], [place], [place] [address]   Sodium 11/09/2021 137  135 - 145 mmol/L Final   Potassium 11/09/2021 4.5  3.5 - 5.1 mmol/L Final   Chloride 11/09/2021 108  98 - 111 mmol/L Final   CO2 11/09/2021 23  22 - 32 mmol/L Final   Glucose, Bld 11/09/2021 152 (H)  70 - 99 mg/dL Final   Glucose reference range applies only to samples taken after fasting for at least 8 hours.   BUN 11/09/2021 21  8 - 23 mg/dL Final   Creatinine, Ser 11/09/2021 1.55 (H)  0.44 - 1.00 mg/dL Final   Calcium 11/09/2021 8.0 (L)  8.9 - 10.3 mg/dL Final   GFR, Estimated 11/09/2021 36 (L)  >60 mL/min Final   Comment: (NOTE) Calculated using the CKD-EPI Creatinine Equation (2021)    Anion gap 11/09/2021 6  5 - 15 Final   Performed at [facility], [place] [address] [place], [place] [address]   WBC 11/10/2021 6.7  4.0 - 10.5 K/uL Final   RBC 11/10/2021 3.34 (L)  3.87 - 5.11 MIL/uL Final   Hemoglobin 11/10/2021 9.0 (L)  12.0 - 15.0 g/dL Final   HCT 11/10/2021 28.8 (L)  36.0 - 46.0 % Final   MCV 11/10/2021 86.2  80.0 - 100.0 fL Final   MCH 11/10/2021 26.9  26.0 - 34.0 pg Final   MCHC 11/10/2021 31.3  30.0 - 36.0 g/dL Final   RDW 11/10/2021 14.6  11.5 - 15.5 % Final   Platelets 11/10/2021 177  150 - 400 K/uL Final   nRBC 11/10/2021 0.0  0.0 - 0.2 % Final   Performed at [facility], [place] [address], [place], [place] [address]  Hospital Outpatient Visit on 10/29/2021  Component Date Value Ref Range Status   ABO/RH(D) 10/29/2021 O POS   Final   Antibody Screen 10/29/2021 NEG   Final   Sample Expiration 10/29/2021 11/10/2021,2359   Final   Extend sample reason 10/29/2021    Final                   Value:NO TRANSFUSIONS OR PREGNANCY IN THE PAST 3 MONTHS Performed at [facility], [place] [address] [place], [place] [address]    MRSA, PCR 10/29/2021 NEGATIVE   NEGATIVE Final  Staphylococcus aureus 10/29/2021 NEGATIVE  NEGATIVE Final   Comment: (NOTE) The Xpert SA Assay (FDA approved for NASAL specimens in patients 43 years of age and older), is one component of a comprehensive surveillance program. It is not intended to diagnose infection nor to guide or monitor treatment. Performed at [facility], [place] [address] [place], [place] [address]    Hgb A1c MFr Bld 10/29/2021 6.0 (H)  4.8 - 5.6 % Final   Comment: (NOTE) Pre diabetes:          5.7%-6.4%  Diabetes:              >6.4%  Glycemic control for   <7.0% adults with diabetes    Mean Plasma Glucose 10/29/2021 125.5  mg/dL Final   Performed at [facility], [place] [address], [place], [place] [address]   Glucose-Capillary 10/29/2021 89  70 - 99 mg/dL Final   Glucose reference range applies only to samples taken after fasting for at least 8 hours.  Orders Only on 10/22/2021  Component Date Value Ref Range Status   Cholesterol 10/22/2021 171  <200 mg/dL Final   HDL 10/22/2021 45 (L)  > OR = 50 mg/dL Final   Triglycerides 10/22/2021 146  <150 mg/dL Final   LDL Cholesterol (Calc) 10/22/2021 101 (H)  mg/dL (calc) Final   Comment: Reference range: <100 . Desirable range <100 mg/dL for primary prevention;   <70 mg/dL for patients with CHD or diabetic patients  with > or = 2 CHD risk factors. Marland Kitchen LDL-C is now calculated using the Martin-Hopkins  calculation, which is a validated novel method providing  better accuracy than the Friedewald equation in the  estimation of LDL-C.  Cresenciano Genre et al. Annamaria Helling. 8657;846(96): 2061-2068  (http://education.[facility].com/faq/FAQ164)    Total CHOL/HDL Ratio 10/22/2021 3.8  <5.0 (calc) Final   Non-HDL Cholesterol (Calc) 10/22/2021 126  <130 mg/dL (calc) Final   Comment: For patients with diabetes plus 1 major ASCVD risk  factor, treating to a non-HDL-C goal of <100 mg/dL  (LDL-C of <70 mg/dL) is considered a therapeutic   option.    Glucose, Bld 10/22/2021 67  65 - 99 mg/dL Final   Comment: .            Fasting reference interval .    BUN 10/22/2021 15  7 - 25 mg/dL Final   Creat 10/22/2021 1.68 (H)  0.50 - 1.05 mg/dL Final   eGFR 10/22/2021 33 (L)  > OR = 60 mL/min/1.25m Final   BUN/Creatinine Ratio 10/22/2021 9  6 - 22 (calc) Final   Sodium 10/22/2021 139  135 - 146 mmol/L Final   Potassium 10/22/2021 4.5  3.5 - 5.3 mmol/L Final   Chloride 10/22/2021 101  98 - 110 mmol/L Final   CO2 10/22/2021 21  20 - 32 mmol/L Final   Calcium 10/22/2021 10.0  8.6 - 10.4 mg/dL Final   Total Protein 10/22/2021 7.4  6.1 - 8.1 g/dL Final   Albumin 10/22/2021 3.9  3.6 - 5.1 g/dL Final   Globulin 10/22/2021 3.5  1.9 - 3.7 g/dL (calc) Final   AG Ratio 10/22/2021 1.1  1.0 - 2.5 (calc) Final   Total Bilirubin 10/22/2021 0.5  0.2 - 1.2 mg/dL Final   Alkaline phosphatase (APISO) 10/22/2021 75  37 - 153 U/L Final   AST 10/22/2021 27  10 - 35 U/L Final   ALT 10/22/2021 12  6 - 29 U/L Final   Vit D, 25-Hydroxy 10/22/2021 54  30 -  100 ng/mL Final   Comment: Vitamin D Status         25-OH Vitamin D: . Deficiency:                    <20 ng/mL Insufficiency:             20 - 29 ng/mL Optimal:                 > or = 30 ng/mL . For 25-OH Vitamin D testing on patients on  D2-supplementation and patients for whom quantitation  of D2 and D3 fractions is required, the Quest[employer]ureD(TM) 25-OH VIT D, (D2,D3), LC/MS/MS is recommended: order  code [id number] (patients >32yrs). . See Note 1 . Note 1 . For additional information, please refer to  http://education.[facility].com/faq/FAQ199  (This link is being provided for informational/ educational purposes only.)    TSH 10/22/2021 0.70  0.40 - 4.50 mIU/L Final   WBC 10/22/2021 5.1  3.8 - 10.8 Thousand/uL Final   RBC 10/22/2021 4.56  3.80 - 5.10 Million/uL Final   Hemoglobin 10/22/2021 11.8  11.7 - 15.5 g/dL Final   HCT 10/22/2021 37.6  35.0 - 45.0 % Final   MCV 10/22/2021  82.5  80.0 - 100.0 fL Final   MCH 10/22/2021 25.9 (L)  27.0 - 33.0 pg Final   MCHC 10/22/2021 31.4 (L)  32.0 - 36.0 g/dL Final   RDW 10/22/2021 14.0  11.0 - 15.0 % Final   Platelets 10/22/2021 248  140 - 400 Thousand/uL Final   MPV 10/22/2021 10.5  7.5 - 12.5 fL Final  Appointment on 10/01/2021  Component Date Value Ref Range Status   WBC 10/01/2021 5.2  4.0 - 10.5 K/uL Final   RBC 10/01/2021 4.27  3.87 - 5.11 Mil/uL Final   Hemoglobin 10/01/2021 11.3 (L)  12.0 - 15.0 g/dL Final   HCT 10/01/2021 34.0 (L)  36.0 - 46.0 % Final   MCV 10/01/2021 79.4  78.0 - 100.0 fl Final   MCHC 10/01/2021 33.3  30.0 - 36.0 g/dL Final   RDW 10/01/2021 14.6  11.5 - 15.5 % Final   Platelets 10/01/2021 226.0  150.0 - 400.0 K/uL Final   Neutrophils Relative % 10/01/2021 68.3  43.0 - 77.0 % Final   Lymphocytes Relative 10/01/2021 17.4  12.0 - 46.0 % Final   Monocytes Relative 10/01/2021 9.3  3.0 - 12.0 % Final   Eosinophils Relative 10/01/2021 4.6  0.0 - 5.0 % Final   Basophils Relative 10/01/2021 0.4  0.0 - 3.0 % Final   Neutro Abs 10/01/2021 3.6  1.4 - 7.7 K/uL Final   Lymphs Abs 10/01/2021 0.9  0.7 - 4.0 K/uL Final   Monocytes Absolute 10/01/2021 0.5  0.1 - 1.0 K/uL Final   Eosinophils Absolute 10/01/2021 0.2  0.0 - 0.7 K/uL Final   Basophils Absolute 10/01/2021 0.0  0.0 - 0.1 K/uL Final     X-Rays:DG Pelvis Portable  Result Date: 11/08/2021 CLINICAL DATA:  Status post right total hip arthroplasty EXAM: PORTABLE PELVIS 1-2 VIEWS COMPARISON:  11/07/2021 FINDINGS: Atherosclerotic changes seen throughout visualized arterial segments. Mild degenerative change the left hip. Right total hip prosthesis is well seated without periprosthetic fracture or lucency. Subcutaneous emphysema has decreased since prior radiograph. IMPRESSION: Uncomplicated right total hip prosthesis with recent postop changes. Electronically Signed   By: Miachel Roux M.D.   On: 11/08/2021 08:28   DG Pelvis Portable  Result Date:  11/07/2021 CLINICAL DATA:  Status post right hip total arthroplasty EXAM:  PORTABLE PELVIS 1-2 VIEWS COMPARISON:  None Available. FINDINGS: Status post right hip total arthroplasty with expected overlying postoperative change. No evidence of perihardware fracture or component malpositioning. IMPRESSION: Status post right hip total arthroplasty with expected overlying postoperative change. No evidence of perihardware fracture or component malpositioning. Electronically Signed   By: Delanna Ahmadi M.D.   On: 11/07/2021 09:25    EKG: Orders placed or performed in visit on 05/20/21   EKG 12-Lead     Hospital Course: Jaleyah Longhi is a 71 y.o. who was admitted to [facility]. They were brought to the operating room on 11/07/2021 and underwent Procedure(s): CONVERSION TO TOTAL HIP-POSTERIOR APPROACH.  Patient tolerated the procedure well and was later transferred to the recovery room and then to the orthopaedic floor for postoperative care. They were given PO and IV analgesics for pain control following their surgery. They were given 24 hours of postoperative antibiotics of  Anti-infectives (From admission, onward)    Start     Dose/Rate Route Frequency Ordered Stop   11/07/21 1400  ceFAZolin (ANCEF) IVPB 2g/100 mL premix        2 g 200 mL/hr over 30 Minutes Intravenous Every 6 hours 11/07/21 1041 11/07/21 2034   11/07/21 0600  ceFAZolin (ANCEF) IVPB 2g/100 mL premix        2 g 200 mL/hr over 30 Minutes Intravenous On call to O.R. 11/07/21 4128 11/07/21 0728      and started on DVT prophylaxis in the form of Aspirin.   PT and OT were ordered for total joint protocol. Discharge planning consulted to help with postop disposition and equipment needs. Patient had a fair night on the evening of surgery. They started to get up OOB with therapy on POD #1. She felt a shift in her hip, and x-ray of the pelvis was obtained which showed no dislocation or fracture. Continued to work with therapy into POD #2  and POD #3, but was not making enough progress to feel safe with discharge home. We decided on short term SNF placement. Continued progressing with PT slowly.    Pt was seen during rounds on day five and was ready to go to [facility] SN[facility]. Pt worked with therapy for one additional session and was meeting their goals. She was discharged to SNF later that day in stable condition.  Diet: Regular diet Activity: WBAT Follow-up: in 1-2 weeks Disposition: Skilled nursing facility Discharged Condition: good   Discharge Instructions     Call MD / Call 911   Complete by: As directed    If you experience chest pain or shortness of breath, CALL 911 and be transported to the hospital emergency room.  If you develope a fever above 101 F, pus (white drainage) or increased drainage or redness at the wound, or calf pain, call your surgeon's office.   Call MD / Call 911   Complete by: As directed    If you experience chest pain or shortness of breath, CALL 911 and be transported to the hospital emergency room.  If you develope a fever above 101 F, pus (white drainage) or increased drainage or redness at the wound, or calf pain, call your surgeon's office.   Change dressing   Complete by: As directed    Maintain surgical dressing until follow up in the clinic. If the edges start to pull up, may reinforce with tape. If the dressing is no longer working, may remove and cover with gauze and tape, but must keep the  area dry and clean.  Call with any questions or concerns.   Constipation Prevention   Complete by: As directed    Drink plenty of fluids.  Prune juice may be helpful.  You may use a stool softener, such as Colace (over the counter) 100 mg twice a day.  Use MiraLax (over the counter) for constipation as needed.   Constipation Prevention   Complete by: As directed    Drink plenty of fluids.  Prune juice may be helpful.  You may use a stool softener, such as Colace (over the counter) 100 mg twice a day.   Use MiraLax (over the counter) for constipation as needed.   Diet - low sodium heart healthy   Complete by: As directed    Diet - low sodium heart healthy   Complete by: As directed    Increase activity slowly as tolerated   Complete by: As directed    Weight bearing as tolerated with assist device (walker, cane, etc) as directed, use it as long as suggested by your surgeon or therapist, typically at least 4-6 weeks.   Increase activity slowly as tolerated   Complete by: As directed    Post-operative opioid taper instructions:   Complete by: As directed    POST-OPERATIVE OPIOID TAPER INSTRUCTIONS: It is important to wean off of your opioid medication as soon as possible. If you do not need pain medication after your surgery it is ok to stop day one. Opioids include: Codeine, Hydrocodone(Norco, Vicodin), Oxycodone(Percocet, oxycontin) and hydromorphone amongst others.  Long term and even short term use of opiods can cause: Increased pain response Dependence Constipation Depression Respiratory depression And more.  Withdrawal symptoms can include Flu like symptoms Nausea, vomiting And more Techniques to manage these symptoms Hydrate well Eat regular healthy meals Stay active Use relaxation techniques(deep breathing, meditating, yoga) Do Not substitute Alcohol to help with tapering If you have been on opioids for less than two weeks and do not have pain than it is ok to stop all together.  Plan to wean off of opioids This plan should start within one week post op of your joint replacement. Maintain the same interval or time between taking each dose and first decrease the dose.  Cut the total daily intake of opioids by one tablet each day Next start to increase the time between doses. The last dose that should be eliminated is the evening dose.      Post-operative opioid taper instructions:   Complete by: As directed    POST-OPERATIVE OPIOID TAPER INSTRUCTIONS: It is  important to wean off of your opioid medication as soon as possible. If you do not need pain medication after your surgery it is ok to stop day one. Opioids include: Codeine, Hydrocodone(Norco, Vicodin), Oxycodone(Percocet, oxycontin) and hydromorphone amongst others.  Long term and even short term use of opiods can cause: Increased pain response Dependence Constipation Depression Respiratory depression And more.  Withdrawal symptoms can include Flu like symptoms Nausea, vomiting And more Techniques to manage these symptoms Hydrate well Eat regular healthy meals Stay active Use relaxation techniques(deep breathing, meditating, yoga) Do Not substitute Alcohol to help with tapering If you have been on opioids for less than two weeks and do not have pain than it is ok to stop all together.  Plan to wean off of opioids This plan should start within one week post op of your joint replacement. Maintain the same interval or time between taking each dose and first decrease the dose.  Cut the total daily intake of opioids by one tablet each day Next start to increase the time between doses. The last dose that should be eliminated is the evening dose.      TED hose   Complete by: As directed    Use stockings (TED hose) for 2 weeks on both leg(s).  You may remove them at night for sleeping.      Allergies as of 11/12/2021   No Known Allergies      Medication List     STOP taking these medications    alendronate 70 MG tablet Commonly known as: FOSAMAX   aspirin 325 MG tablet Replaced by: aspirin 81 MG chewable tablet       TAKE these medications    aspirin 81 MG chewable tablet Chew 1 tablet (81 mg total) by mouth 2 (two) times daily for 28 days. Replaces: aspirin 325 MG tablet   atorvastatin 40 MG tablet Commonly known as: LIPITOR Take 1 tablet (40 mg total) by mouth daily at 6 PM.   cetirizine 10 MG tablet Commonly known as: ZYRTEC Take 10 mg by mouth daily as  needed for allergies.   citalopram 10 MG tablet Commonly known as: CELEXA Take 10 mg by mouth daily.   docusate sodium 100 MG capsule Commonly known as: COLACE Take 1 capsule (100 mg total) by mouth 2 (two) times daily.   escitalopram 20 MG tablet Commonly known as: LEXAPRO Take 20 mg by mouth daily.   furosemide 20 MG tablet Commonly known as: LASIX Take 20 mg by mouth daily.   gabapentin 100 MG capsule Commonly known as: NEURONTIN Take 100 mg by mouth 3 (three) times daily.   HYDROcodone-acetaminophen 5-325 MG tablet Commonly known as: NORCO/VICODIN Take 1 tablet by mouth every 6 (six) hours as needed for severe pain (pain score 4-6).   lisinopril 10 MG tablet Commonly known as: ZESTRIL Take 10 mg by mouth daily.   losartan 25 MG tablet Commonly known as: COZAAR Take 25 mg by mouth daily.   methocarbamol 500 MG tablet Commonly known as: ROBAXIN Take 1 tablet (500 mg total) by mouth every 6 (six) hours as needed for muscle spasms (muscle pain).   multivitamin with minerals Tabs tablet Take 1 tablet by mouth daily.   polyethylene glycol 17 g packet Commonly known as: MIRALAX / GLYCOLAX Take 17 g by mouth daily as needed for mild constipation.   zolpidem 10 MG tablet Commonly known as: AMBIEN Take 12.5 mg by mouth at bedtime.               Discharge Care Instructions  (From admission, onward)           Start     Ordered   11/08/21 0000  Change dressing       Comments: Maintain surgical dressing until follow up in the clinic. If the edges start to pull up, may reinforce with tape. If the dressing is no longer working, may remove and cover with gauze and tape, but must keep the area dry and clean.  Call with any questions or concerns.   11/08/21 1345            Contact information for follow-up providers     Paralee Cancel, MD. Schedule an appointment as soon as possible for a visit in 10 day(s).   Specialty: Orthopedic Surgery Contact  information: [address] [place] [address] [phone]  Contact information for after-discharge care     Destination     [facility] Preferred SNF .   Service: Skilled Nursing Contact information: [address] [place] [phone]                     Signed: Griffith Citron, PA-C Orthopedic Surgery 11/12/2021, 7:07 AM

## 2021-11-12 NOTE — TOC Transition Note (Signed)
Transition of Care [facility]) - CM/SW Discharge Note   Patient Details  Name: Sophia Rodriguez MRN: [id number] Date of Birth: 14-Jan-1951  [facility] CM/SW Contact:  Amada Jupiter, LCSW Phone Number: 11/12/2021, 10:34 AM   Clinical Narrative:    Pt medically cleared for dc to [facility] SNF today and have received insurance authorization.  Pt and daughter aware and agreeable.  PTAR called at 10:30am.  RN to call report to [phone].  No further TOC needs.   Final next level of care: Skilled Nursing Facility Barriers to Discharge: Barriers Resolved   Patient Goals and CMS Choice Patient states their goals for this hospitalization and ongoing recovery are:: return home      Discharge Placement   Existing PASRR number confirmed : 11/11/21          Patient chooses bed at: [facility] Patient to be transferred to facility by: PTAR Name of family member notified: daughter Patient and family notified of of transfer: 11/12/21  Discharge Plan and Services                DME Arranged: Dan Humphreys rolling DME Agency: [employer] Date DME Agency Contacted: 11/08/21   Representative spoke with at DME Agency: Lurena Joiner            Social Determinants of Health (SDOH) Interventions     Readmission Risk Interventions    11/08/2021   10:02 AM  Readmission Risk Prevention Plan  Post Dischage Appt Complete  Medication Screening Complete  Transportation Screening Complete

## 2022-01-14 ENCOUNTER — Other Ambulatory Visit: Payer: Self-pay | Admitting: Internal Medicine

## 2022-01-15 LAB — BASIC METABOLIC PANEL WITH GFR
BUN/Creatinine Ratio: 9 (calc) (ref 6–22)
BUN: 13 mg/dL (ref 7–25)
CO2: 30 mmol/L (ref 20–32)
Calcium: 10 mg/dL (ref 8.6–10.4)
Chloride: 101 mmol/L (ref 98–110)
Creat: 1.4 mg/dL — ABNORMAL HIGH (ref 0.60–1.00)
Glucose, Bld: 86 mg/dL (ref 65–99)
Potassium: 4.6 mmol/L (ref 3.5–5.3)
Sodium: 140 mmol/L (ref 135–146)
eGFR: 40 mL/min/{1.73_m2} — ABNORMAL LOW (ref >=60)

## 2022-01-15 LAB — EXTRA LAV TOP TUBE

## 2022-03-20 ENCOUNTER — Other Ambulatory Visit: Payer: Self-pay | Admitting: *Deleted

## 2022-03-20 ENCOUNTER — Ambulatory Visit (HOSPITAL_COMMUNITY): Payer: Medicare Other

## 2022-03-20 DIAGNOSIS — I739 Peripheral vascular disease, unspecified: Secondary | ICD-10-CM

## 2022-03-21 ENCOUNTER — Encounter: Payer: Medicare Other | Admitting: Vascular Surgery

## 2022-03-21 ENCOUNTER — Encounter: Payer: Self-pay | Admitting: Vascular Surgery

## 2022-03-21 ENCOUNTER — Ambulatory Visit: Payer: Medicare Other | Admitting: Vascular Surgery

## 2022-03-21 ENCOUNTER — Ambulatory Visit (HOSPITAL_COMMUNITY)
Admission: RE | Admit: 2022-03-21 | Discharge: 2022-03-21 | Disposition: A | Discharge status: Home / Self Care | Payer: Medicare Other | Source: Ambulatory Visit | Attending: Vascular Surgery | Admitting: Vascular Surgery

## 2022-03-21 VITALS — BP 109/74 | HR 71 | Temp 98.0°F | Resp 20 | Ht 65.0 in | Wt 171.0 lb

## 2022-03-21 DIAGNOSIS — I739 Peripheral vascular disease, unspecified: Secondary | ICD-10-CM

## 2022-03-21 NOTE — Progress Notes (Signed)
Office Note     CC: Peripheral arterial disease Requesting Provider:  Larey DresserAjlouny, Martha, DPM  HPI: Sophia Rodriguez is a 71 y.o. (07-Jun-1950) female presenting at the request of .Fleet ContrasAvbuere, Edwin, MD peripheral arterial disease  On exam today, [name] was doing well.  She had no complaints.  A native of [place], she moved to [place] for change of pace over 20 years ago.  She has 2 children, and several grandchildren.  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  She has had chronic lower extremity edema since that time.  She continues to ambulate, using a walker, but has not driven since her stroke.  She denies symptoms of claudication, ischemic rest pain, tissue loss  The pt is  on a statin for cholesterol management.  The pt is  on a daily aspirin.   Other AC:  - The pt is  on medication for hypertension.   The pt is not diabetic.  Tobacco hx:  -  Past Medical History:  Diagnosis Date   Anxiety    Arthritis    Chronic kidney disease    related to blood pressure meds- pt reports being taken off of meds    Complication of anesthesia    slow to wake up many years ago per pt   Depression    Diabetes mellitus    hx of not on meds, not on meds in over a year    Dyspnea    with exertion   Edema    in legs per pt    Hypertension    Stroke (HCC)    x 3 weakness on right side     Past Surgical History:  Procedure Laterality Date   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/07/2021   Procedure: CONVERSION TO TOTAL HIP-POSTERIOR APPROACH;  Surgeon: Durene Romanslin, Matthew, MD;  Location: [facility];  Service: Orthopedics;  Laterality: Right;   HIP ARTHROPLASTY Right 11/24/2012   Procedure: RIGHT HIP HEMIARTHROPLASTY;  Surgeon: Verlee RossettiSteven R Norris, MD;  Location: [facility];  Service: Orthopedics;  Laterality: Right;  hemiatroplasty, DePuy Triloc     Social History   Socioeconomic History   Marital status: Widowed    Spouse name: Not on file   Number of children: 2   Years of education: Grad school   Highest education level: Not on file  Occupational History   Not on file  Tobacco Use   Smoking status: Former    Types: Cigarettes    Start date: 771971    Quit date: 05/28/2007    Years since quitting: 14.8   Smokeless tobacco: Never  Vaping Use   Vaping Use: Never used  Substance and Sexual Activity   Alcohol use: No   Drug use: No   Sexual activity: Not on file  Other Topics Concern   Not on file  Social History Narrative   Lives at home by herself.    Daughter come to help at home and she has a cleaning lady to help twice a week.    Caffeine: Every morning 1-1.5 cups coffee/day, and 12oz tea/day    Right-handed   Social Determinants of Health   Financial Resource Strain: Not on file  Food Insecurity: Not on file  Transportation Needs: Not on file  Physical Activity: Not on file  Stress: Not on file  Social Connections: Not on file  Intimate  Partner Violence: Not on file   Family History  Problem Relation Age of Onset   Stroke Father    Stroke Brother     Current Outpatient Medications  Medication Sig Dispense Refill   atorvastatin (LIPITOR) 40 MG tablet Take 1 tablet (40 mg total) by mouth daily at 6 PM. 30 tablet 1   cetirizine (ZYRTEC) 10 MG tablet Take 10 mg by mouth daily as needed for allergies.     citalopram (CELEXA) 10 MG tablet Take 10 mg by mouth daily.     docusate sodium (COLACE) 100 MG capsule Take 1 capsule (100 mg total) by mouth 2 (two) times daily. 10 capsule 0   escitalopram (LEXAPRO) 20 MG tablet Take 20 mg by mouth daily.     furosemide (LASIX) 20 MG tablet Take 20 mg by mouth daily.     gabapentin (NEURONTIN) 100 MG capsule Take 100 mg by mouth 3 (three) times daily.     HYDROcodone-acetaminophen (NORCO/VICODIN) 5-325 MG tablet Take 1 tablet by mouth every 6 (six) hours as needed for  severe pain (pain score 4-6). 30 tablet 0   lisinopril (ZESTRIL) 10 MG tablet Take 10 mg by mouth daily.     losartan (COZAAR) 25 MG tablet Take 25 mg by mouth daily.     methocarbamol (ROBAXIN) 500 MG tablet Take 1 tablet (500 mg total) by mouth every 6 (six) hours as needed for muscle spasms (muscle pain). 40 tablet 0   Multiple Vitamin (MULTIVITAMIN WITH MINERALS) TABS Take 1 tablet by mouth daily.     polyethylene glycol (MIRALAX / GLYCOLAX) 17 g packet Take 17 g by mouth daily as needed for mild constipation. 14 each 0   zolpidem (AMBIEN) 10 MG tablet Take 12.5 mg by mouth at bedtime.     No current facility-administered medications for this visit.    No Known Allergies   REVIEW OF SYSTEMS:  [X]  denotes positive finding, [ ]  denotes negative finding Cardiac  Comments:  Chest pain or chest pressure:    Shortness of breath upon exertion:    Short of breath when lying flat:    Irregular heart rhythm:        Vascular    Pain in calf, thigh, or hip brought on by ambulation:    Pain in feet at night that wakes you up from your sleep:     Blood clot in your veins:    Leg swelling:         Pulmonary    Oxygen at home:    Productive cough:     Wheezing:         Neurologic    Sudden weakness in arms or legs:     Sudden numbness in arms or legs:     Sudden onset of difficulty speaking or slurred speech:    Temporary loss of vision in one eye:     Problems with dizziness:         Gastrointestinal    Blood in stool:     Vomited blood:         Genitourinary    Burning when urinating:     Blood in urine:        Psychiatric    Major depression:         Hematologic    Bleeding problems:    Problems with blood clotting too easily:        Skin    Rashes or ulcers:  Constitutional    Fever or chills:      PHYSICAL EXAMINATION:  Vitals:   03/21/22 1103  BP: 109/74  Pulse: 71  Resp: 20  Temp: 98 F (36.7 C)  SpO2: 95%  Weight: 171 lb (77.6 kg)  Height: 5'  5" (1.651 m)    General:  WDWN in NAD; vital signs documented above Gait: Not observed HENT: WNL, normocephalic Pulmonary: normal non-labored breathing , without wheezing Cardiac: regular HR Abdomen: soft, NT, no masses Skin: without rashes Vascular Exam/Pulses:  Right Left  Radial 2+ (normal) 2+ (normal)  Ulnar    Femoral    Popliteal    DP absent absent  PT     Extremities: without ischemic changes, without Gangrene , without cellulitis; without open wounds;  Bilateral lower extremity edema appreciated in the feet, right greater than left. Musculoskeletal: no muscle wasting or atrophy  Neurologic: A&O X 3;  No focal weakness or paresthesias are detected Psychiatric:  The pt has Normal affect.   Non-Invasive Vascular Imaging:   ABI Findings:  +---------+------------------+-----+----------+--------+  Right   Rt Pressure (mmHg)IndexWaveform  Comment   +---------+------------------+-----+----------+--------+  Brachial 100                                        +---------+------------------+-----+----------+--------+  PTA     95                0.95 biphasic            +---------+------------------+-----+----------+--------+  DP      95                0.95 monophasic          +---------+------------------+-----+----------+--------+  Great Toe59                0.59                     +---------+------------------+-----+----------+--------+   +---------+------------------+-----+--------+-------+  Left    Lt Pressure (mmHg)IndexWaveformComment  +---------+------------------+-----+--------+-------+  Brachial 98                                      +---------+------------------+-----+--------+-------+  PTA     108               1.08 biphasic         +---------+------------------+-----+--------+-------+  DP      105               1.05 biphasic         +---------+------------------+-----+--------+-------+  Evelina Bucy                 0.76                  +---------+------------------+-----+--------+-------+     ASSESSMENT/PLAN: Salome Hautala is a 71 y.o. female presenting with ABIs demonstrating mild peripheral arterial disease bilaterally.  Fortunately, Julie-Anne is asymptomatic.  We discussed the signs and symptoms of worsening peripheral arterial disease including claudication, ischemic rest pain, tissue loss.  She was asked to call my office immediately should she appreciate rest pain or tissue loss at the feet occur that has poor healing.  At this time, I plan to follow her on a yearly basis with repeat ABI. She was asked to continue her statin medication as  well as daily baby aspirin, and continue to try to live an active lifestyle.   Victorino Sparrow, MD [facility] [phone]

## 2022-07-18 ENCOUNTER — Other Ambulatory Visit: Payer: Self-pay | Admitting: Internal Medicine

## 2022-12-01 ENCOUNTER — Emergency Department (HOSPITAL_COMMUNITY): Payer: Medicare Other

## 2022-12-01 ENCOUNTER — Encounter (HOSPITAL_COMMUNITY): Payer: Self-pay

## 2022-12-01 ENCOUNTER — Observation Stay (HOSPITAL_COMMUNITY): Payer: Medicare Other

## 2022-12-01 ENCOUNTER — Observation Stay (HOSPITAL_COMMUNITY)
Admission: EM | Admit: 2022-12-01 | Discharge: 2022-12-05 | Disposition: A | Discharge status: Skilled Nursing Facility | Payer: Medicare Other | Attending: Internal Medicine | Admitting: Internal Medicine

## 2022-12-01 ENCOUNTER — Other Ambulatory Visit: Payer: Self-pay

## 2022-12-01 DIAGNOSIS — W19XXXA Unspecified fall, initial encounter: Secondary | ICD-10-CM | POA: Diagnosis not present

## 2022-12-01 DIAGNOSIS — E1122 Type 2 diabetes mellitus with diabetic chronic kidney disease: Secondary | ICD-10-CM | POA: Diagnosis not present

## 2022-12-01 DIAGNOSIS — I5032 Chronic diastolic (congestive) heart failure: Secondary | ICD-10-CM | POA: Insufficient documentation

## 2022-12-01 DIAGNOSIS — M25551 Pain in right hip: Secondary | ICD-10-CM | POA: Diagnosis present

## 2022-12-01 DIAGNOSIS — Z8673 Personal history of transient ischemic attack (TIA), and cerebral infarction without residual deficits: Secondary | ICD-10-CM | POA: Insufficient documentation

## 2022-12-01 DIAGNOSIS — Z7982 Long term (current) use of aspirin: Secondary | ICD-10-CM | POA: Insufficient documentation

## 2022-12-01 DIAGNOSIS — N1832 Chronic kidney disease, stage 3b: Secondary | ICD-10-CM | POA: Insufficient documentation

## 2022-12-01 DIAGNOSIS — I130 Hypertensive heart and chronic kidney disease with heart failure and stage 1 through stage 4 chronic kidney disease, or unspecified chronic kidney disease: Secondary | ICD-10-CM | POA: Insufficient documentation

## 2022-12-01 DIAGNOSIS — Z79899 Other long term (current) drug therapy: Secondary | ICD-10-CM | POA: Insufficient documentation

## 2022-12-01 DIAGNOSIS — N179 Acute kidney failure, unspecified: Secondary | ICD-10-CM | POA: Insufficient documentation

## 2022-12-01 DIAGNOSIS — R2681 Unsteadiness on feet: Secondary | ICD-10-CM | POA: Diagnosis not present

## 2022-12-01 DIAGNOSIS — M25559 Pain in unspecified hip: Secondary | ICD-10-CM | POA: Insufficient documentation

## 2022-12-01 DIAGNOSIS — Z87891 Personal history of nicotine dependence: Secondary | ICD-10-CM | POA: Diagnosis not present

## 2022-12-01 DIAGNOSIS — R339 Retention of urine, unspecified: Secondary | ICD-10-CM | POA: Diagnosis not present

## 2022-12-01 DIAGNOSIS — M79672 Pain in left foot: Secondary | ICD-10-CM | POA: Insufficient documentation

## 2022-12-01 LAB — I-STAT CHEM 8, ED
BUN: 37 mg/dL — ABNORMAL HIGH (ref 8–23)
Calcium, Ion: 1.23 mmol/L (ref 1.15–1.40)
Chloride: 104 mmol/L (ref 98–111)
Creatinine, Ser: 2.4 mg/dL — ABNORMAL HIGH (ref 0.44–1.00)
Glucose, Bld: 100 mg/dL — ABNORMAL HIGH (ref 70–99)
HCT: 36 % (ref 36.0–46.0)
Hemoglobin: 12.2 g/dL (ref 12.0–15.0)
Potassium: 5 mmol/L (ref 3.5–5.1)
Sodium: 140 mmol/L (ref 135–145)
TCO2: 27 mmol/L (ref 22–32)

## 2022-12-01 LAB — COMPREHENSIVE METABOLIC PANEL
ALT: 15 U/L (ref 0–44)
AST: 30 U/L (ref 15–41)
Albumin: 3.9 g/dL (ref 3.5–5.0)
Alkaline Phosphatase: 63 U/L (ref 38–126)
Anion gap: 9 (ref 5–15)
BUN: 36 mg/dL — ABNORMAL HIGH (ref 8–23)
CO2: 27 mmol/L (ref 22–32)
Calcium: 9.6 mg/dL (ref 8.9–10.3)
Chloride: 102 mmol/L (ref 98–111)
Creatinine, Ser: 2.1 mg/dL — ABNORMAL HIGH (ref 0.44–1.00)
GFR, Estimated: 25 mL/min — ABNORMAL LOW (ref >60)
Glucose, Bld: 103 mg/dL — ABNORMAL HIGH (ref 70–99)
Potassium: 4.7 mmol/L (ref 3.5–5.1)
Sodium: 138 mmol/L (ref 135–145)
Total Bilirubin: 0.6 mg/dL (ref 0.3–1.2)
Total Protein: 7.8 g/dL (ref 6.5–8.1)

## 2022-12-01 LAB — CBC
HCT: 38.3 % (ref 36.0–46.0)
Hemoglobin: 11.9 g/dL — ABNORMAL LOW (ref 12.0–15.0)
MCH: 26.5 pg (ref 26.0–34.0)
MCHC: 31.1 g/dL (ref 30.0–36.0)
MCV: 85.3 fL (ref 80.0–100.0)
Platelets: 212 10*3/uL (ref 150–400)
RBC: 4.49 MIL/uL (ref 3.87–5.11)
RDW: 13.7 % (ref 11.5–15.5)
WBC: 6.4 10*3/uL (ref 4.0–10.5)
nRBC: 0 % (ref 0.0–0.2)

## 2022-12-01 LAB — URINALYSIS, W/ REFLEX TO CULTURE (INFECTION SUSPECTED)
Bacteria, UA: NONE SEEN
Bilirubin Urine: NEGATIVE
Glucose, UA: NEGATIVE mg/dL
Hgb urine dipstick: NEGATIVE
Ketones, ur: NEGATIVE mg/dL
Leukocytes,Ua: NEGATIVE
Nitrite: NEGATIVE
Protein, ur: NEGATIVE mg/dL
Specific Gravity, Urine: 1.015 (ref 1.005–1.030)
pH: 5 (ref 5.0–8.0)

## 2022-12-01 LAB — TROPONIN I (HIGH SENSITIVITY): Troponin I (High Sensitivity): 5 ng/L (ref <18)

## 2022-12-01 LAB — MAGNESIUM: Magnesium: 2.3 mg/dL (ref 1.7–2.4)

## 2022-12-01 LAB — I-STAT CG4 LACTIC ACID, ED: Lactic Acid, Venous: 1.1 mmol/L (ref 0.5–1.9)

## 2022-12-01 LAB — CK: Total CK: 133 U/L (ref 38–234)

## 2022-12-01 MED ORDER — ASPIRIN 325 MG PO TBEC
325.0000 mg | DELAYED_RELEASE_TABLET | Freq: Every day | ORAL | Status: DC
  Administered 2022-12-01 – 2022-12-05 (×5): 325 mg via ORAL
  Filled 2022-12-01 (×5): qty 1

## 2022-12-01 MED ORDER — POLYETHYLENE GLYCOL 3350 17 G PO PACK: 17.0000 g | PACK | Freq: Every day | ORAL | Status: DC | PRN

## 2022-12-01 MED ORDER — LACTATED RINGERS IV BOLUS
1000.0000 mL | Freq: Once | INTRAVENOUS | Status: AC
  Administered 2022-12-01: 1000 mL via INTRAVENOUS

## 2022-12-01 MED ORDER — ADULT MULTIVITAMIN W/MINERALS CH
1.0000 | ORAL_TABLET | Freq: Every day | ORAL | Status: DC
  Administered 2022-12-01 – 2022-12-05 (×5): 1 via ORAL
  Filled 2022-12-01 (×5): qty 1

## 2022-12-01 MED ORDER — ATORVASTATIN CALCIUM 40 MG PO TABS
40.0000 mg | ORAL_TABLET | Freq: Every day | ORAL | Status: DC
  Administered 2022-12-02 – 2022-12-04 (×3): 40 mg via ORAL
  Filled 2022-12-01 (×3): qty 1

## 2022-12-01 MED ORDER — GABAPENTIN 100 MG PO CAPS
100.0000 mg | ORAL_CAPSULE | Freq: Three times a day (TID) | ORAL | Status: DC
  Administered 2022-12-01 – 2022-12-05 (×12): 100 mg via ORAL
  Filled 2022-12-01 (×13): qty 1

## 2022-12-01 MED ORDER — PROCHLORPERAZINE EDISYLATE 10 MG/2ML IJ SOLN: 5.0000 mg | Freq: Four times a day (QID) | INTRAMUSCULAR | Status: DC | PRN

## 2022-12-01 MED ORDER — INFLUENZA VAC A&B SURF ANT ADJ 0.5 ML IM SUSY
0.5000 mL | PREFILLED_SYRINGE | INTRAMUSCULAR | Status: DC
  Filled 2022-12-01: qty 0.5

## 2022-12-01 MED ORDER — ZOLPIDEM TARTRATE 5 MG PO TABS
5.0000 mg | ORAL_TABLET | Freq: Once | ORAL | Status: AC
  Administered 2022-12-01: 5 mg via ORAL
  Filled 2022-12-01: qty 1

## 2022-12-01 MED ORDER — OXYCODONE-ACETAMINOPHEN 5-325 MG PO TABS
1.0000 | ORAL_TABLET | Freq: Once | ORAL | Status: AC
  Administered 2022-12-01: 1 via ORAL
  Filled 2022-12-01: qty 1

## 2022-12-01 MED ORDER — LORATADINE 10 MG PO TABS
10.0000 mg | ORAL_TABLET | Freq: Every day | ORAL | Status: DC
  Administered 2022-12-01 – 2022-12-02 (×2): 10 mg via ORAL
  Filled 2022-12-01 (×5): qty 1

## 2022-12-01 MED ORDER — MELATONIN 5 MG PO TABS
5.0000 mg | ORAL_TABLET | Freq: Every evening | ORAL | Status: DC | PRN
  Administered 2022-12-02 – 2022-12-04 (×3): 5 mg via ORAL
  Filled 2022-12-01 (×3): qty 1

## 2022-12-01 MED ORDER — ESCITALOPRAM OXALATE 20 MG PO TABS
20.0000 mg | ORAL_TABLET | Freq: Every day | ORAL | Status: DC
  Administered 2022-12-01 – 2022-12-05 (×5): 20 mg via ORAL
  Filled 2022-12-01 (×5): qty 1

## 2022-12-01 MED ORDER — SODIUM CHLORIDE 0.9 % IV SOLN: INTRAVENOUS | Status: AC

## 2022-12-01 MED ORDER — ACETAMINOPHEN 325 MG PO TABS
650.0000 mg | ORAL_TABLET | Freq: Four times a day (QID) | ORAL | Status: DC | PRN
  Administered 2022-12-02: 650 mg via ORAL
  Filled 2022-12-01: qty 2

## 2022-12-01 MED ORDER — ENOXAPARIN SODIUM 40 MG/0.4ML IJ SOSY
40.0000 mg | PREFILLED_SYRINGE | INTRAMUSCULAR | Status: DC
  Administered 2022-12-01: 40 mg via SUBCUTANEOUS
  Filled 2022-12-01: qty 0.4

## 2022-12-01 NOTE — ED Notes (Signed)
ED TO INPATIENT HANDOFF REPORT  ED Nurse Name and Phone #: Deon Pilling [phone]  S Name/Age/Gender Sophia Rodriguez 72 y.o. female Room/Bed: WA17/WA17  Code Status   Code Status: Full Code  Home/SNF/Other Home Patient oriented to: self, place, time, and situation Is this baseline? Yes   Triage Complete: Triage complete  Chief Complaint Fall [W19.XXXA]  Triage Note EMS reports from home. Unwitnessed fall, EMS called by caregiver found on floor. Denies LOC, headstrike, no blood thinners. Pt c/o Right hip pain. Had R hip replacement in August. EMS noted possible shortening and rotation.  BP 142/78 RR 18 HR 84 Sp02 96 RA CBG 143    Allergies No Known Allergies  Level of Care/Admitting Diagnosis ED Disposition     ED Disposition  Admit   Condition  --   Comment  Hospital Area: [facility] [[id number]]  Level of Care: Med-Surg [16]  May place patient in observation at [facility] or [facility] if equivalent level of care is available:: Yes  Covid Evaluation: Asymptomatic - no recent exposure (last 10 days) testing not required  Diagnosis: Fall [[id number]]  Admitting Physician: Darlin Drop [[id number]]  Attending Physician: Darlin Drop [[id number]]          B Medical/Surgery History Past Medical History:  Diagnosis Date   Anxiety    Arthritis    Chronic kidney disease    related to blood pressure meds- pt reports being taken off of meds    Complication of anesthesia    slow to wake up many years ago per pt   Depression    Diabetes mellitus    hx of not on meds, not on meds in over a year    Dyspnea    with exertion   Edema    in legs per pt    Hypertension    Stroke (HCC)    x 3 weakness on right side    Past Surgical History:  Procedure Laterality Date   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/07/2021   Procedure: CONVERSION TO TOTAL HIP-POSTERIOR APPROACH;  Surgeon: Durene Romans, MD;  Location: [facility];  Service: Orthopedics;  Laterality: Right;    HIP ARTHROPLASTY Right 11/24/2012   Procedure: RIGHT HIP HEMIARTHROPLASTY;  Surgeon: Verlee Rossetti, MD;  Location: [facility];  Service: Orthopedics;  Laterality: Right;  hemiatroplasty, DePuy Triloc     A IV Location/Drains/Wounds Patient Lines/Drains/Airways Status     Active Line/Drains/Airways     Name Placement date Placement time Site Days   Peripheral IV 12/01/22 20 G Right Antecubital 12/01/22  1420  Antecubital  less than 1            Intake/Output Last 24 hours No intake or output data in the 24 hours ending 12/01/22 2039  Labs/Imaging Results for orders placed or performed during the hospital encounter of 12/01/22 (from the past 48 hour(s))  Comprehensive metabolic panel     Status: Abnormal   Collection Time: 12/01/22  2:24 PM  Result Value Ref Range   Sodium 138 135 - 145 mmol/L   Potassium 4.7 3.5 - 5.1 mmol/L   Chloride 102 98 - 111 mmol/L   CO2 27 22 - 32 mmol/L   Glucose, Bld 103 (H) 70 - 99 mg/dL    Comment: Glucose reference range applies only to samples taken after fasting for at least 8 hours.   BUN 36 (H) 8 - 23 mg/dL   Creatinine, Ser 4.69 (H) 0.44 - 1.00 mg/dL  Calcium 9.6 8.9 - 10.3 mg/dL   Total Protein 7.8 6.5 - 8.1 g/dL   Albumin 3.9 3.5 - 5.0 g/dL   AST 30 15 - 41 U/L   ALT 15 0 - 44 U/L   Alkaline Phosphatase 63 38 - 126 U/L   Total Bilirubin 0.6 0.3 - 1.2 mg/dL   GFR, Estimated 25 (L) >60 mL/min    Comment: (NOTE) Calculated using the CKD-EPI Creatinine Equation (2021)    Anion gap 9 5 - 15    Comment: Performed at [facility], [address], [place], [place] [address]  CBC     Status: Abnormal   Collection Time: 12/01/22  2:24 PM  Result Value Ref Range   WBC 6.4 4.0 - 10.5 K/uL   RBC 4.49 3.87 - 5.11 MIL/uL   Hemoglobin 11.9 (L) 12.0 - 15.0 g/dL   HCT 09.8 11.9 - 14.7 %   MCV 85.3 80.0 - 100.0 fL   MCH 26.5 26.0 - 34.0 pg   MCHC 31.1 30.0 - 36.0 g/dL   RDW 82.9 56.2 - 13.0 %   Platelets 212 150 - 400 K/uL    nRBC 0.0 0.0 - 0.2 %    Comment: Performed at [facility], [address], [place], [place] [address]  CK     Status: None   Collection Time: 12/01/22  2:24 PM  Result Value Ref Range   Total CK 133 38 - 234 U/L    Comment: Performed at [facility], [address], [place], [place] [address]  Troponin I (High Sensitivity)     Status: None   Collection Time: 12/01/22  2:24 PM  Result Value Ref Range   Troponin I (High Sensitivity) 5 <18 ng/L    Comment: (NOTE) Elevated high sensitivity troponin I (hsTnI) values and significant  changes across serial measurements may suggest ACS but many other  chronic and acute conditions are known to elevate hsTnI results.  Refer to the "Links" section for chest pain algorithms and additional  guidance. Performed at [facility], [address] [place], [place] [address]   Magnesium     Status: None   Collection Time: 12/01/22  2:24 PM  Result Value Ref Range   Magnesium 2.3 1.7 - 2.4 mg/dL    Comment: Performed at [facility], [address], [place], [place] [address]  I-Stat Chem 8, ED     Status: Abnormal   Collection Time: 12/01/22  2:31 PM  Result Value Ref Range   Sodium 140 135 - 145 mmol/L   Potassium 5.0 3.5 - 5.1 mmol/L   Chloride 104 98 - 111 mmol/L   BUN 37 (H) 8 - 23 mg/dL   Creatinine, Ser 0.10 (H) 0.44 - 1.00 mg/dL   Glucose, Bld 272 (H) 70 - 99 mg/dL    Comment: Glucose reference range applies only to samples taken after fasting for at least 8 hours.   Calcium, Ion 1.23 1.15 - 1.40 mmol/L   TCO2 27 22 - 32 mmol/L   Hemoglobin 12.2 12.0 - 15.0 g/dL   HCT 53.6 64.4 - 03.4 %  I-Stat Lactic Acid, ED     Status: None   Collection Time: 12/01/22  2:32 PM  Result Value Ref Range   Lactic Acid, Venous 1.1 0.5 - 1.9 mmol/L  Urinalysis, w/ Reflex to Culture (Infection Suspected) -Urine, Catheterized     Status: None   Collection Time: 12/01/22  5:23  PM  Result Value Ref Range   Specimen Source URINE, CATHETERIZED    Color, Urine YELLOW YELLOW   APPearance CLEAR CLEAR   Specific Gravity, Urine 1.015 1.005 - 1.030   pH 5.0 5.0 - 8.0   Glucose, UA NEGATIVE NEGATIVE mg/dL   Hgb urine dipstick NEGATIVE NEGATIVE   Bilirubin Urine NEGATIVE NEGATIVE   Ketones, ur NEGATIVE NEGATIVE mg/dL   Protein, ur NEGATIVE NEGATIVE mg/dL   Nitrite NEGATIVE NEGATIVE   Leukocytes,Ua NEGATIVE NEGATIVE   RBC / HPF 0-5 0 - 5 RBC/hpf   WBC, UA 0-5 0 - 5 WBC/hpf    Comment:        Reflex urine culture not performed if WBC <=10, OR if Squamous epithelial cells >5. If Squamous epithelial cells >5 suggest recollection.    Bacteria, UA NONE SEEN NONE SEEN   Squamous Epithelial / HPF 0-5 0 - 5 /HPF   Mucus PRESENT    Hyaline Casts, UA PRESENT     Comment: Performed at [facility], [address], [place], [place] [address]   CT HEAD WO CONTRAST  Result Date: 12/01/2022 CLINICAL DATA:  Head trauma, pain EXAM: CT HEAD WITHOUT CONTRAST CT CERVICAL SPINE WITHOUT CONTRAST TECHNIQUE: Multidetector CT imaging of the head and cervical spine was performed following the standard protocol without intravenous contrast. Multiplanar CT image reconstructions of the cervical spine were also generated. RADIATION DOSE REDUCTION: This exam was performed according to the departmental dose-optimization program which includes automated exposure control, adjustment of the mA and/or kV according to patient size and/or use of iterative reconstruction technique. COMPARISON:  None Available. FINDINGS: CT HEAD FINDINGS Brain: There is no acute intracranial hemorrhage, extra-axial fluid collection, or acute infarct. Background parenchymal volume is normal. There is remote infarct throughout the left ACA distribution with associated ex vacuo dilatation of the left lateral ventricle. There is an additional small remote infarct in the right cerebellar hemisphere. Gray-white  differentiation is otherwise preserved. The ventricles are otherwise normal in size. The pituitary and suprasellar region are normal. There is no mass lesion. There is no mass effect or midline shift. Vascular: There is calcification of the bilateral carotid siphons. Skull: Normal. Negative for fracture or focal lesion. Sinuses/Orbits: The imaged paranasal sinuses are clear. The globes and orbits are unremarkable. Other: The mastoid air cells and middle ear cavities are clear. CT CERVICAL SPINE FINDINGS Alignment: Normal. Skull base and vertebrae: Skull base alignment is maintained. Vertebral body heights are preserved. There is no evidence of acute fracture. There is no suspicious osseous lesion. There are bulky flowing anterior osteophytes throughout the cervical spine consistent with diffuse idiopathic skeletal hyperostosis. Soft tissues and spinal canal: No prevertebral fluid or swelling. No visible canal hematoma. Disc levels: There is moderate multilevel disc space narrowing and degenerative endplate change throughout the cervical spine with bulky anterior osteophytes. There is overall mild facet arthropathy. There is no high-grade spinal canal or neural foraminal stenosis. Upper chest: There is emphysema in the lung apices. Other: There is 1.8 cm left thyroid nodule, previously evaluated by ultrasound. IMPRESSION: 1. No acute intracranial pathology. 2. Remote infarct in the left ACA distribution and small remote infarct in the right cerebellar hemisphere. 3. No acute fracture or traumatic malalignment of the cervical spine. 4. Bulky flowing anterior osteophytes throughout the cervical spine consistent with diffuse idiopathic skeletal hyperostosis (DISH). 5. Emphysema. Electronically Signed   By: Lesia Hausen M.D.   On: 12/01/2022 16:33   CT Cervical Spine Wo Contrast  Result Date: 12/01/2022 CLINICAL DATA:  Head trauma, pain EXAM: CT HEAD WITHOUT CONTRAST CT CERVICAL SPINE WITHOUT CONTRAST TECHNIQUE:  Multidetector CT imaging of the head and cervical spine was performed following the standard protocol without intravenous contrast. Multiplanar CT image reconstructions of the cervical spine were also generated. RADIATION DOSE REDUCTION: This exam was performed according to the departmental dose-optimization program which includes automated exposure control, adjustment of the mA and/or kV according to patient size and/or use of iterative reconstruction technique. COMPARISON:  None Available. FINDINGS: CT HEAD FINDINGS Brain: There is no acute intracranial hemorrhage, extra-axial fluid collection, or acute infarct. Background parenchymal volume is normal. There is remote infarct throughout the left ACA distribution with associated ex vacuo dilatation of the left lateral ventricle. There is an additional small remote infarct in the right cerebellar hemisphere. Gray-white differentiation is otherwise preserved. The ventricles are otherwise normal in size. The pituitary and suprasellar region are normal. There is no mass lesion. There is no mass effect or midline shift. Vascular: There is calcification of the bilateral carotid siphons. Skull: Normal. Negative for fracture or focal lesion. Sinuses/Orbits: The imaged paranasal sinuses are clear. The globes and orbits are unremarkable. Other: The mastoid air cells and middle ear cavities are clear. CT CERVICAL SPINE FINDINGS Alignment: Normal. Skull base and vertebrae: Skull base alignment is maintained. Vertebral body heights are preserved. There is no evidence of acute fracture. There is no suspicious osseous lesion. There are bulky flowing anterior osteophytes throughout the cervical spine consistent with diffuse idiopathic skeletal hyperostosis. Soft tissues and spinal canal: No prevertebral fluid or swelling. No visible canal hematoma. Disc levels: There is moderate multilevel disc space narrowing and degenerative endplate change throughout the cervical spine with  bulky anterior osteophytes. There is overall mild facet arthropathy. There is no high-grade spinal canal or neural foraminal stenosis. Upper chest: There is emphysema in the lung apices. Other: There is 1.8 cm left thyroid nodule, previously evaluated by ultrasound. IMPRESSION: 1. No acute intracranial pathology. 2. Remote infarct in the left ACA distribution and small remote infarct in the right cerebellar hemisphere. 3. No acute fracture or traumatic malalignment of the cervical spine. 4. Bulky flowing anterior osteophytes throughout the cervical spine consistent with diffuse idiopathic skeletal hyperostosis (DISH). 5. Emphysema. Electronically Signed   By: Lesia Hausen M.D.   On: 12/01/2022 16:33   DG Femur Min 2 Views Right  Result Date: 12/01/2022 CLINICAL DATA:  Pain after fall EXAM: RIGHT FEMUR 2 VIEWS COMPARISON:  Pelvis right hip x-ray earlier 12/01/2018 FINDINGS: Proximal aspect of the femur is not included in the imaging field. What is seen of the femur demonstrates osteopenia. No fracture or dislocation. Preserved knee joint at the edge of the imaging field. Prominent vascular calcifications. IMPRESSION: Limited x-ray. Osteopenia. No acute osseous abnormality. Please correlate with separate imaging. Electronically Signed   By: Karen Kays M.D.   On: 12/01/2022 11:55   CT PELVIS WO CONTRAST  Result Date: 12/01/2022 CLINICAL DATA:  Pain after trauma EXAM: CT PELVIS WITHOUT CONTRAST TECHNIQUE: Multidetector CT imaging of the pelvis was performed following the standard protocol without intravenous contrast. RADIATION DOSE REDUCTION: This exam was performed according to the departmental dose-optimization program which includes automated exposure control, adjustment of the mA and/or kV according to patient size and/or use of iterative reconstruction technique. COMPARISON:  X-ray 12/01/2022 FINDINGS: Urinary Tract:  Preserved contours of the urinary bladder. Bowel: On this non oral contrast exam the  visualized portions of the small and large bowel  are nondilated in the pelvis. There is mild-to-moderate left-sided colonic stool. Normal appendix in the right lower quadrant. Vascular/Lymphatic: Prominent vascular calcifications identified along the aorta and iliac vessels. No specific abnormal lymph node enlargement identified. Reproductive: Uterus is present. No separate adnexal mass. Small amount of free fluid in the pelvis. Other:  Small amount of free fluid in the pelvis.  Nonspecific. Musculoskeletal: Significant streak artifact related to patient's right hip arthroplasty. No obvious hardware failure. There is a screw fixated acetabular cup and Press-Fit femoral component. Mild degenerative changes seen of the sacroiliac joints, lower lumbar spine. There is some concentric moderate joint space loss of the left hip with osteophytes and sclerosis. Global hyperostosis. No obvious fracture. IMPRESSION: Moderate degenerative changes. Right hip arthroplasty. Associated streak artifact obscuring the surrounding soft tissues and bony structures. Nonspecific free fluid in the pelvis. Please correlate with clinical presentation. Electronically Signed   By: Karen Kays M.D.   On: 12/01/2022 11:49   DG Chest Portable 1 View  Result Date: 12/01/2022 CLINICAL DATA:  Un witnessed fall.  Right hip pain. EXAM: PORTABLE CHEST 1 VIEW COMPARISON:  Chest radiographs 12/26/2012 and chest CT 08/28/2020 FINDINGS: The cardiomediastinal silhouette is within normal limits. Aortic atherosclerosis is noted. No airspace consolidation, edema, sizable pleural effusion, or pneumothorax is identified. Degenerative changes are noted in the spine and at the shoulders. IMPRESSION: No active disease. Electronically Signed   By: Sebastian Ache M.D.   On: 12/01/2022 10:37   DG Hip Unilat W or Wo Pelvis 2-3 Views Right  Addendum Date: 12/01/2022   ADDENDUM REPORT: 12/01/2022 10:25 ADDENDUM: Pelvis x-ray. History: Pain after fall Comparison  03/19/2021 EXAM: Pelvis and right hip three views IMPRESSION: Right hip arthroplasty.  Osteopenia with chronic changes. Electronically Signed   By: Karen Kays M.D.   On: 12/01/2022 10:25   Result Date: 12/01/2022 CLINICAL DATA:  Pain after fall EXAM: DG HIP (WITH OR WITHOUT PELVIS) 2-3V RIGHT COMPARISON:  None Available. FINDINGS: No consolidation, pneumothorax or effusion. No edema. Normal cardiopericardial silhouette. Calcified aorta. Degenerative changes seen along the shoulders. Degenerative changes of the spine IMPRESSION: No acute cardiopulmonary disease. Electronically Signed: By: Karen Kays M.D. On: 12/01/2022 10:18    Pending Labs Unresulted Labs (From admission, onward)     Start     Ordered   12/08/22 0500  Creatinine, serum  (enoxaparin (LOVENOX)    CrCl >/= 30 ml/min)  Weekly,   R     Comments: while on enoxaparin therapy    12/01/22 1927   12/02/22 0500  CBC  Tomorrow morning,   R        12/01/22 1928   12/02/22 0500  Basic metabolic panel  Tomorrow morning,   R        12/01/22 1928   12/02/22 0500  Magnesium  Tomorrow morning,   R        12/01/22 1928            Vitals/Pain Today's Vitals   12/01/22 1730 12/01/22 1800 12/01/22 1815 12/01/22 1930  BP: 112/65 108/61 108/61   Pulse: 68 65 68   Resp: 18 10 14    Temp:    98.4 F (36.9 C)  TempSrc:    Oral  SpO2: 100% 100% 100%   PainSc:        Isolation Precautions No active isolations  Medications Medications  enoxaparin (LOVENOX) injection 40 mg (has no administration in time range)  acetaminophen (TYLENOL) tablet 650 mg (has no administration in time  range)  prochlorperazine (COMPAZINE) injection 5 mg (has no administration in time range)  polyethylene glycol (MIRALAX / GLYCOLAX) packet 17 g (has no administration in time range)  melatonin tablet 5 mg (has no administration in time range)  0.9 %  sodium chloride infusion (has no administration in time range)  aspirin EC tablet 325 mg (has no  administration in time range)  atorvastatin (LIPITOR) tablet 40 mg (has no administration in time range)  loratadine (CLARITIN) tablet 10 mg (has no administration in time range)  multivitamin with minerals tablet 1 tablet (has no administration in time range)  escitalopram (LEXAPRO) tablet 20 mg (has no administration in time range)  gabapentin (NEURONTIN) capsule 100 mg (has no administration in time range)  oxyCODONE-acetaminophen (PERCOCET/ROXICET) 5-325 MG per tablet 1 tablet (1 tablet Oral Given 12/01/22 0950)  oxyCODONE-acetaminophen (PERCOCET/ROXICET) 5-325 MG per tablet 1 tablet (1 tablet Oral Given 12/01/22 1421)  lactated ringers bolus 1,000 mL (0 mLs Intravenous Stopped 12/01/22 1657)    Mobility non-ambulatory     Focused Assessments    R Recommendations: See Admitting Provider Note  Report given to:   Additional Notes:

## 2022-12-01 NOTE — H&P (Signed)
History and Physical  Sophia Rodriguez UEA:[id number] DOB: 1951-02-01 DOA: 12/01/2022  Referring physician: Dr. Criss Alvine, EDP  PCP: Fleet Contras, MD  Outpatient Specialists: Vascular surgery, cardiology Patient coming from: Home  Chief Complaint: Fall  HPI: Sophia Rodriguez is a 72 y.o. female with medical history significant for history of CVA with right-sided weakness, diet controlled type 2 diabetes, essential hypertension, hyperlipidemia, CKD 3B, chronic diastolic CHF, chronic anxiety/depression who presents after a fall at home.  States she woke up in the morning around 6 AM to use the bathroom and urinated.  She walked to her kitchen then felt dizzy.  She tried to hold on to the refrigerator but fell backwards.  She was in her usual state of health prior to this.  She last saw her PCP 2 months ago and there were no changes in her medications.  She states she has felt dizzy in the past when her blood sugar was low.  She stayed on the floor for about 2 hours.  At 8 AM when her care taker arrived to her home to start her morning shift she found her on the floor and called EMS.  Upon EMS arrival, her blood sugar was in the 140s.  Denies loss of consciousness.  She complained of right hip pain.  In the ED, no evidence of fracture on imaging.  The patient feels too weak to go home.  Lab studies were notable for elevated creatinine above baseline, likely prerenal AKI on CKD 3B.  IV fluid initiated.  PT OT and TOC consulted to assist with DC planning.  Admitted by [facility], hospitalist service, to MedSurg unit as observation status.  In the ED she was unable to void, states she voids about 3-4 times per day and the last time she voided was around 6 AM.  She had in and out catheter done in the ED.  The amount of urine output was not documented.  ED Course: Temperature 98.7.  BP 108/61, pulse 68, respiration rate 14, O2 saturation 100% on room air.  Lab studies notable for BUN 37, creatinine 2.40 from  baseline of 1.40.  UA is negative for pyuria.  Chest x-ray nonacute.  Review of Systems: Review of systems as noted in the HPI. All other systems reviewed and are negative.   Past Medical History:  Diagnosis Date   Anxiety    Arthritis    Chronic kidney disease    related to blood pressure meds- pt reports being taken off of meds    Complication of anesthesia    slow to wake up many years ago per pt   Depression    Diabetes mellitus    hx of not on meds, not on meds in over a year    Dyspnea    with exertion   Edema    in legs per pt    Hypertension    Stroke (HCC)    x 3 weakness on right side    Past Surgical History:  Procedure Laterality Date   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/07/2021   Procedure: CONVERSION TO TOTAL HIP-POSTERIOR APPROACH;  Surgeon: Durene Romans, MD;  Location: [facility];  Service: Orthopedics;  Laterality: Right;   HIP ARTHROPLASTY Right 11/24/2012   Procedure: RIGHT HIP HEMIARTHROPLASTY;  Surgeon: Verlee Rossetti, MD;  Location: [facility];  Service: Orthopedics;  Laterality: Right;  hemiatroplasty, DePuy Triloc    Social History:  reports that she quit smoking about 15 years ago. Her smoking use included cigarettes. She started  smoking about 53 years ago. She has never used smokeless tobacco. She reports that she does not drink alcohol and does not use drugs.   No Known Allergies  Family History  Problem Relation Age of Onset   Stroke Father    Stroke Brother       Prior to Admission medications   Medication Sig Start Date End Date Taking? Authorizing Provider  alendronate (FOSAMAX) 70 MG tablet Take 1 tablet by mouth once a week.   Yes [provider]  aspirin EC 325 MG tablet Take 325 mg by mouth daily.   Yes [provider]  atorvastatin (LIPITOR) 40 MG tablet Take 1 tablet (40 mg total) by mouth daily at 6 PM. 10/05/12  Yes Angiulli, Mcarthur Rossetti, PA-C  cetirizine (ZYRTEC) 10 MG tablet Take 10 mg by mouth daily as needed for  allergies.   Yes [provider]  citalopram (CELEXA) 10 MG tablet Take 10 mg by mouth daily.   Yes [provider]  docusate sodium (COLACE) 100 MG capsule Take 1 capsule (100 mg total) by mouth 2 (two) times daily. 11/12/21  Yes Cassandria Anger, PA-C  escitalopram (LEXAPRO) 20 MG tablet Take 20 mg by mouth daily. 05/16/21  Yes [provider]  furosemide (LASIX) 20 MG tablet TAKE 1 TABLET(20 MG) BY MOUTH DAILY Patient taking differently: Take 20 mg by mouth daily. 07/18/22  Yes BranchAlben Spittle, MD  gabapentin (NEURONTIN) 100 MG capsule Take 100 mg by mouth 3 (three) times daily.   Yes [provider]  lisinopril (ZESTRIL) 10 MG tablet Take 10 mg by mouth daily. 09/28/21  Yes [provider]  losartan (COZAAR) 25 MG tablet Take 25 mg by mouth daily. 06/19/20  Yes [provider]  Multiple Vitamin (MULTIVITAMIN WITH MINERALS) TABS Take 1 tablet by mouth daily. 10/05/12  Yes Angiulli, Mcarthur Rossetti, PA-C  zolpidem (AMBIEN CR) 12.5 MG CR tablet Take 12.5 mg by mouth at bedtime as needed for sleep.   Yes [provider]    Physical Exam: BP 108/61   Pulse 68   Temp 98.7 F (37.1 C)   Resp 14   SpO2 100%   General: 72 y.o. year-old female well developed well nourished in no acute distress.  Alert and oriented x3. Cardiovascular: Regular rate and rhythm with no rubs or gallops.  No thyromegaly or JVD noted.  No lower extremity edema. 2/4 pulses in all 4 extremities. Respiratory: Clear to auscultation with no wheezes or rales. Good inspiratory effort. Abdomen: Soft nontender nondistended with normal bowel sounds x4 quadrants. Muskuloskeletal: No cyanosis, clubbing or edema noted bilaterally Neuro: CN II-XII intact, strength, sensation, reflexes Skin: No ulcerative lesions noted or rashes Psychiatry: Judgement and insight appear normal. Mood is appropriate for condition and setting          Labs on Admission:  Basic Metabolic  Panel: Recent Labs  Lab 12/01/22 1424 12/01/22 1431  NA 138 140  K 4.7 5.0  CL 102 104  CO2 27  --   GLUCOSE 103* 100*  BUN 36* 37*  CREATININE 2.10* 2.40*  CALCIUM 9.6  --   MG 2.3  --    Liver Function Tests: Recent Labs  Lab 12/01/22 1424  AST 30  ALT 15  ALKPHOS 63  BILITOT 0.6  PROT 7.8  ALBUMIN 3.9   No results for input(s): "LIPASE", "AMYLASE" in the last 168 hours. No results for input(s): "AMMONIA" in the last 168 hours. CBC: Recent Labs  Lab 12/01/22 1424 12/01/22 1431  WBC 6.4  --   HGB 11.9* 12.2  HCT 38.3 36.0  MCV 85.3  --   PLT 212  --    Cardiac Enzymes: Recent Labs  Lab 12/01/22 1424  CKTOTAL 133    BNP (last 3 results) No results for input(s): "BNP" in the last 8760 hours.  ProBNP (last 3 results) No results for input(s): "PROBNP" in the last 8760 hours.  CBG: No results for input(s): "GLUCAP" in the last 168 hours.  Radiological Exams on Admission: CT HEAD WO CONTRAST  Result Date: 12/01/2022 CLINICAL DATA:  Head trauma, pain EXAM: CT HEAD WITHOUT CONTRAST CT CERVICAL SPINE WITHOUT CONTRAST TECHNIQUE: Multidetector CT imaging of the head and cervical spine was performed following the standard protocol without intravenous contrast. Multiplanar CT image reconstructions of the cervical spine were also generated. RADIATION DOSE REDUCTION: This exam was performed according to the departmental dose-optimization program which includes automated exposure control, adjustment of the mA and/or kV according to patient size and/or use of iterative reconstruction technique. COMPARISON:  None Available. FINDINGS: CT HEAD FINDINGS Brain: There is no acute intracranial hemorrhage, extra-axial fluid collection, or acute infarct. Background parenchymal volume is normal. There is remote infarct throughout the left ACA distribution with associated ex vacuo dilatation of the left lateral ventricle. There is an additional small remote infarct in the right  cerebellar hemisphere. Gray-white differentiation is otherwise preserved. The ventricles are otherwise normal in size. The pituitary and suprasellar region are normal. There is no mass lesion. There is no mass effect or midline shift. Vascular: There is calcification of the bilateral carotid siphons. Skull: Normal. Negative for fracture or focal lesion. Sinuses/Orbits: The imaged paranasal sinuses are clear. The globes and orbits are unremarkable. Other: The mastoid air cells and middle ear cavities are clear. CT CERVICAL SPINE FINDINGS Alignment: Normal. Skull base and vertebrae: Skull base alignment is maintained. Vertebral body heights are preserved. There is no evidence of acute fracture. There is no suspicious osseous lesion. There are bulky flowing anterior osteophytes throughout the cervical spine consistent with diffuse idiopathic skeletal hyperostosis. Soft tissues and spinal canal: No prevertebral fluid or swelling. No visible canal hematoma. Disc levels: There is moderate multilevel disc space narrowing and degenerative endplate change throughout the cervical spine with bulky anterior osteophytes. There is overall mild facet arthropathy. There is no high-grade spinal canal or neural foraminal stenosis. Upper chest: There is emphysema in the lung apices. Other: There is 1.8 cm left thyroid nodule, previously evaluated by ultrasound. IMPRESSION: 1. No acute intracranial pathology. 2. Remote infarct in the left ACA distribution and small remote infarct in the right cerebellar hemisphere. 3. No acute fracture or traumatic malalignment of the cervical spine. 4. Bulky flowing anterior osteophytes throughout the cervical spine consistent with diffuse idiopathic skeletal hyperostosis (DISH). 5. Emphysema. Electronically Signed   By: Lesia Hausen M.D.   On: 12/01/2022 16:33   CT Cervical Spine Wo Contrast  Result Date: 12/01/2022 CLINICAL DATA:  Head trauma, pain EXAM: CT HEAD WITHOUT CONTRAST CT CERVICAL SPINE  WITHOUT CONTRAST TECHNIQUE: Multidetector CT imaging of the head and cervical spine was performed following the standard protocol without intravenous contrast. Multiplanar CT image reconstructions of the cervical spine were also generated. RADIATION DOSE REDUCTION: This exam was performed according to the departmental dose-optimization program which includes automated exposure control, adjustment of the mA and/or kV according to patient size and/or use of iterative reconstruction technique. COMPARISON:  None Available. FINDINGS: CT HEAD FINDINGS Brain:  There is no acute intracranial hemorrhage, extra-axial fluid collection, or acute infarct. Background parenchymal volume is normal. There is remote infarct throughout the left ACA distribution with associated ex vacuo dilatation of the left lateral ventricle. There is an additional small remote infarct in the right cerebellar hemisphere. Gray-white differentiation is otherwise preserved. The ventricles are otherwise normal in size. The pituitary and suprasellar region are normal. There is no mass lesion. There is no mass effect or midline shift. Vascular: There is calcification of the bilateral carotid siphons. Skull: Normal. Negative for fracture or focal lesion. Sinuses/Orbits: The imaged paranasal sinuses are clear. The globes and orbits are unremarkable. Other: The mastoid air cells and middle ear cavities are clear. CT CERVICAL SPINE FINDINGS Alignment: Normal. Skull base and vertebrae: Skull base alignment is maintained. Vertebral body heights are preserved. There is no evidence of acute fracture. There is no suspicious osseous lesion. There are bulky flowing anterior osteophytes throughout the cervical spine consistent with diffuse idiopathic skeletal hyperostosis. Soft tissues and spinal canal: No prevertebral fluid or swelling. No visible canal hematoma. Disc levels: There is moderate multilevel disc space narrowing and degenerative endplate change throughout  the cervical spine with bulky anterior osteophytes. There is overall mild facet arthropathy. There is no high-grade spinal canal or neural foraminal stenosis. Upper chest: There is emphysema in the lung apices. Other: There is 1.8 cm left thyroid nodule, previously evaluated by ultrasound. IMPRESSION: 1. No acute intracranial pathology. 2. Remote infarct in the left ACA distribution and small remote infarct in the right cerebellar hemisphere. 3. No acute fracture or traumatic malalignment of the cervical spine. 4. Bulky flowing anterior osteophytes throughout the cervical spine consistent with diffuse idiopathic skeletal hyperostosis (DISH). 5. Emphysema. Electronically Signed   By: Lesia Hausen M.D.   On: 12/01/2022 16:33   DG Femur Min 2 Views Right  Result Date: 12/01/2022 CLINICAL DATA:  Pain after fall EXAM: RIGHT FEMUR 2 VIEWS COMPARISON:  Pelvis right hip x-ray earlier 12/01/2018 FINDINGS: Proximal aspect of the femur is not included in the imaging field. What is seen of the femur demonstrates osteopenia. No fracture or dislocation. Preserved knee joint at the edge of the imaging field. Prominent vascular calcifications. IMPRESSION: Limited x-ray. Osteopenia. No acute osseous abnormality. Please correlate with separate imaging. Electronically Signed   By: Karen Kays M.D.   On: 12/01/2022 11:55   CT PELVIS WO CONTRAST  Result Date: 12/01/2022 CLINICAL DATA:  Pain after trauma EXAM: CT PELVIS WITHOUT CONTRAST TECHNIQUE: Multidetector CT imaging of the pelvis was performed following the standard protocol without intravenous contrast. RADIATION DOSE REDUCTION: This exam was performed according to the departmental dose-optimization program which includes automated exposure control, adjustment of the mA and/or kV according to patient size and/or use of iterative reconstruction technique. COMPARISON:  X-ray 12/01/2022 FINDINGS: Urinary Tract:  Preserved contours of the urinary bladder. Bowel: On this non  oral contrast exam the visualized portions of the small and large bowel are nondilated in the pelvis. There is mild-to-moderate left-sided colonic stool. Normal appendix in the right lower quadrant. Vascular/Lymphatic: Prominent vascular calcifications identified along the aorta and iliac vessels. No specific abnormal lymph node enlargement identified. Reproductive: Uterus is present. No separate adnexal mass. Small amount of free fluid in the pelvis. Other:  Small amount of free fluid in the pelvis.  Nonspecific. Musculoskeletal: Significant streak artifact related to patient's right hip arthroplasty. No obvious hardware failure. There is a screw fixated acetabular cup and Press-Fit femoral component. Mild degenerative changes seen  of the sacroiliac joints, lower lumbar spine. There is some concentric moderate joint space loss of the left hip with osteophytes and sclerosis. Global hyperostosis. No obvious fracture. IMPRESSION: Moderate degenerative changes. Right hip arthroplasty. Associated streak artifact obscuring the surrounding soft tissues and bony structures. Nonspecific free fluid in the pelvis. Please correlate with clinical presentation. Electronically Signed   By: Karen Kays M.D.   On: 12/01/2022 11:49   DG Chest Portable 1 View  Result Date: 12/01/2022 CLINICAL DATA:  Un witnessed fall.  Right hip pain. EXAM: PORTABLE CHEST 1 VIEW COMPARISON:  Chest radiographs 12/26/2012 and chest CT 08/28/2020 FINDINGS: The cardiomediastinal silhouette is within normal limits. Aortic atherosclerosis is noted. No airspace consolidation, edema, sizable pleural effusion, or pneumothorax is identified. Degenerative changes are noted in the spine and at the shoulders. IMPRESSION: No active disease. Electronically Signed   By: Sebastian Ache M.D.   On: 12/01/2022 10:37   DG Hip Unilat W or Wo Pelvis 2-3 Views Right  Addendum Date: 12/01/2022   ADDENDUM REPORT: 12/01/2022 10:25 ADDENDUM: Pelvis x-ray. History: Pain  after fall Comparison 03/19/2021 EXAM: Pelvis and right hip three views IMPRESSION: Right hip arthroplasty.  Osteopenia with chronic changes. Electronically Signed   By: Karen Kays M.D.   On: 12/01/2022 10:25   Result Date: 12/01/2022 CLINICAL DATA:  Pain after fall EXAM: DG HIP (WITH OR WITHOUT PELVIS) 2-3V RIGHT COMPARISON:  None Available. FINDINGS: No consolidation, pneumothorax or effusion. No edema. Normal cardiopericardial silhouette. Calcified aorta. Degenerative changes seen along the shoulders. Degenerative changes of the spine IMPRESSION: No acute cardiopulmonary disease. Electronically Signed: By: Karen Kays M.D. On: 12/01/2022 10:18    EKG: I independently viewed the EKG done and my findings are as followed: Sinus rhythm rate of 67, nonspecific ST-T changes.  QTc 449.  Assessment/Plan Present on Admission:  Fall  Principal Problem:   Fall  Fall, mechanical Felt dizzy prior to falling, will obtain orthostatic vital signs. Start gentle IV fluid hydration due to concern for dehydration. No evidence of fracture on imaging CT head and cervical spine nonacute. X-ray right hip nonacute CT pelvis, right hip arthroplasty, moderate degenerative changes. UA negative for pyuria, chest x-ray nonacute. PT OT assessment Fall precautions TOC consulted to assist with DC planning.  Right hip pain post fall X-ray of right hip showing osteopenia, no acute osseous abnormality. As needed analgesics  AKI on CKD 3B, suspect prerenal in the setting of dehydration from poor oral intake, home scheduled diuretic Rule out post renal, follow renal ultrasound Baseline creatinine 1.40 with GFR of 48 Presented with creatinine of 2.40 with GFR of 25 Hold off home p.o. Lasix 20 mg daily Hold off home lisinopril for now Avoid nephrotoxic agents, dehydration, and hypotension Monitor urine output with strict I's and O's Repeat BMP in the morning Gentle IV fluid NS at 75 cc/h x 1 day.  Acute urinary  retention States she voids about 3-4 times per day Voided around 6 AM prior to presentation to the ED. Was unable to void in the ED after more than 13 hours, had in and out cath, urine output was not documented. Will obtain a renal ultrasound to rule out hydronephrosis or any other renal structural abnormalities As needed bladder scan  Chronic diastolic CHF Euvolemic on exam Last 2D echo done on 05/08/2020 revealed LVEF 50 to 55% with grade 1 diastolic dysfunction Hold off home p.o. Lasix 20 mg daily due to AKI.   Hold off home ACE inhibitor due to  AKI. Strict I's and O's and daily weight Closely monitor volume status while on IV fluid.  History of CVA Resume home aspirin and statin  Chronic anxiety/depression Resume home escitalopram    Time: 75 minutes.   DVT prophylaxis: Subcu Lovenox daily  Code Status: Full code  Family Communication: None at bedside  Disposition Plan: Admitted to MedSurg unit  Consults called: TOC, PT OT  Admission status: Observation status   Status is: Observation    Darlin Drop MD [facility] Pager [phone]  If 7PM-7AM, please contact night-coverage [email or web address] Password [facility]  12/01/2022, 7:29 PM

## 2022-12-01 NOTE — Plan of Care (Signed)
  Problem: Education: Goal: Knowledge of General Education information will improve Description: Including pain rating scale, medication(s)/side effects and non-pharmacologic comfort measures 12/01/2022 2342 by Eilleen Kempf, RN Outcome: Progressing 12/01/2022 2342 by Eilleen Kempf, RN Outcome: Progressing   Problem: Health Behavior/Discharge Planning: Goal: Ability to manage health-related needs will improve 12/01/2022 2342 by Eilleen Kempf, RN Outcome: Progressing 12/01/2022 2342 by Eilleen Kempf, RN Outcome: Progressing

## 2022-12-01 NOTE — Care Management (Addendum)
PROGRESS NOTE    Sophia Rodriguez  NWG:[id number] DOB: 1951/03/05 DOA: 12/01/2022 PCP: Fleet Contras, MD   Brief Narrative:  See ED documentation for complete history of present illness, briefly patient had a mechanical fall at home with mild elevation in creatinine now downtrending appropriately after fluids. Imaging unremarkable for fracture. Request PT/OT evaluation for further recommendations on whether or not patient can return home safely.  If PT/OT were to recommend placement then hospital admission/observation for safe disposition is certainly reasonable. However, if patient is able to ambulate and can return home safely with her current support system including family and home health aides we could feasibly set up home health with ongoing physical therapy.   Assessment & Plan:   Ambulatory dysfunction with mechanical fall and weakness. -Imaging negative -PT OT to follow for possible placement needs vs return to home w/ HHPT  AKI on baseline CKD3b (progressing from 3a over the past year), resolving -Encourage PO intake -Baseline creatine around 1.5/1.6 - GFR 33-36 (currently 25)  Dispo: The patient is from: Home              Anticipated d/c is to: TBD pending PT evaluation              Anticipated d/c date is: Pending above        Objective: Vitals:   12/01/22 1700 12/01/22 1730 12/01/22 1800 12/01/22 1815  BP: 98/65 112/65 108/61 108/61  Pulse: 68 68 65 68  Resp: 11 18 10 14   Temp: 98.7 F (37.1 C)     TempSrc:      SpO2: 100% 100% 100% 100%    Data Reviewed: I have personally reviewed following labs and imaging studies  CBC: Recent Labs  Lab 12/01/22 1424 12/01/22 1431  WBC 6.4  --   HGB 11.9* 12.2  HCT 38.3 36.0  MCV 85.3  --   PLT 212  --    Basic Metabolic Panel: Recent Labs  Lab 12/01/22 1424 12/01/22 1431  NA 138 140  K 4.7 5.0  CL 102 104  CO2 27  --   GLUCOSE 103* 100*  BUN 36* 37*  CREATININE 2.10* 2.40*  CALCIUM 9.6  --   MG 2.3  --     GFR: CrCl cannot be calculated (Unknown ideal weight.). Liver Function Tests: Recent Labs  Lab 12/01/22 1424  AST 30  ALT 15  ALKPHOS 63  BILITOT 0.6  PROT 7.8  ALBUMIN 3.9   Cardiac Enzymes: Recent Labs  Lab 12/01/22 1424  CKTOTAL 133   Sepsis Labs: Recent Labs  Lab 12/01/22 1432  LATICACIDVEN 1.1   No results found for this or any previous visit (from the past 240 hour(s)).   Radiology Studies: DG Hip Unilat W or Wo Pelvis 2-3 Views Right  Addendum Date: 12/01/2022   ADDENDUM REPORT: 12/01/2022 10:25 ADDENDUM: Pelvis x-ray. History: Pain after fall Comparison 03/19/2021 EXAM: Pelvis and right hip three views IMPRESSION: Right hip arthroplasty.  Osteopenia with chronic changes. Electronically Signed   By: Karen Kays M.D.   On: 12/01/2022 10:25   Result Date: 12/01/2022 CLINICAL DATA:  Pain after fall EXAM: DG HIP (WITH OR WITHOUT PELVIS) 2-3V RIGHT COMPARISON:  None Available. FINDINGS: No consolidation, pneumothorax or effusion. No edema. Normal cardiopericardial silhouette. Calcified aorta. Degenerative changes seen along the shoulders. Degenerative changes of the spine IMPRESSION: No acute cardiopulmonary disease. Electronically Signed: By: Karen Kays M.D. On: 12/01/2022 10:18    Scheduled Meds: Continuous Infusions:   LOS: 0  days   Time spent:  Azucena Fallen, DO [facility]  If 7PM-7AM, please contact night-coverage [email or web address]  12/01/2022, 6:45 PM

## 2022-12-01 NOTE — ED Provider Notes (Signed)
Care transferred to me.  Labs show an AKI which could account for her weakness today.  No signs of an obvious fracture.  The CT pelvis mentioned some nonspecific free fluid in her pelvis.  However she has no abdominal tenderness on my exam.  She was given some IV fluids. She is too weak to walk, will need admission.    Pricilla Loveless, MD 12/01/22 1710

## 2022-12-01 NOTE — ED Triage Notes (Signed)
EMS reports from home. Unwitnessed fall, EMS called by caregiver found on floor. Denies LOC, headstrike, no blood thinners. Pt c/o Right hip pain. Had R hip replacement in August. EMS noted possible shortening and rotation.  BP 142/78 RR 18 HR 84 Sp02 96 RA CBG 143

## 2022-12-01 NOTE — ED Provider Notes (Signed)
[facility] Provider Note   CSN: [id number] Arrival date & time: 12/01/22  6578     History  Chief Complaint  Patient presents with   Fall   Hip Pain    Shakiea Rendell is a 72 y.o. female.  HPI Patient presents after a fall.  Medical history includes DM, CVA, HTN, arthritis, anxiety.  She is not on blood thinners.  At baseline, she ambulates with a walker.  She lives alone but her daughter checks on her frequently.  She also has an aide that comes by on Mondays and Wednesdays.  This morning at 6 AM, she had a mechanical fall.  During this fall, she fell backwards and landed on her bottom.  She subsequently had pain to her right hip area, greater in the posterior aspect.  She was able to crawl across the floor and unlock her door.  Her aide arrived at 8 AM.  Her aide was unable to get her to her feet.  EMS was called.  Patient initially refused transport but ultimately relented.  Currently, she endorses ongoing pain in her right hip but denies any other areas of discomfort.  Per chart review, she went right hip replacement 1 year ago.  Her orthopedic doctor is Dr. Charlann Boxer.    Home Medications Prior to Admission medications   Medication Sig Start Date End Date Taking? Authorizing Provider  atorvastatin (LIPITOR) 40 MG tablet Take 1 tablet (40 mg total) by mouth daily at 6 PM. 10/05/12   Angiulli, Mcarthur Rossetti, PA-C  cetirizine (ZYRTEC) 10 MG tablet Take 10 mg by mouth daily as needed for allergies.    [provider]  citalopram (CELEXA) 10 MG tablet Take 10 mg by mouth daily.    [provider]  docusate sodium (COLACE) 100 MG capsule Take 1 capsule (100 mg total) by mouth 2 (two) times daily. 11/12/21   Cassandria Anger, PA-C  escitalopram (LEXAPRO) 20 MG tablet Take 20 mg by mouth daily. 05/16/21   [provider]  furosemide (LASIX) 20 MG tablet TAKE 1 TABLET(20 MG) BY MOUTH DAILY 07/18/22   Maisie Fus, MD  gabapentin  (NEURONTIN) 100 MG capsule Take 100 mg by mouth 3 (three) times daily.    [provider]  HYDROcodone-acetaminophen (NORCO/VICODIN) 5-325 MG tablet Take 1 tablet by mouth every 6 (six) hours as needed for severe pain (pain score 4-6). 11/12/21   Cassandria Anger, PA-C  lisinopril (ZESTRIL) 10 MG tablet Take 10 mg by mouth daily. 09/28/21   [provider]  losartan (COZAAR) 25 MG tablet Take 25 mg by mouth daily. 06/19/20   [provider]  methocarbamol (ROBAXIN) 500 MG tablet Take 1 tablet (500 mg total) by mouth every 6 (six) hours as needed for muscle spasms (muscle pain). 11/12/21   Cassandria Anger, PA-C  Multiple Vitamin (MULTIVITAMIN WITH MINERALS) TABS Take 1 tablet by mouth daily. 10/05/12   Angiulli, Mcarthur Rossetti, PA-C  polyethylene glycol (MIRALAX / GLYCOLAX) 17 g packet Take 17 g by mouth daily as needed for mild constipation. 11/12/21   Cassandria Anger, PA-C  zolpidem (AMBIEN) 10 MG tablet Take 12.5 mg by mouth at bedtime. 08/16/20   [provider]      Allergies    Patient has no known allergies.    Review of Systems   Review of Systems  Musculoskeletal:  Positive for arthralgias.  All other systems reviewed and are negative.   Physical Exam  Updated Vital Signs BP 109/66   Pulse 69   Temp 98.3 F (36.8 C) (Oral)   Resp 10   SpO2 100%  Physical Exam Vitals and nursing note reviewed.  Constitutional:      General: She is not in acute distress.    Appearance: Normal appearance. She is well-developed. She is not ill-appearing, toxic-appearing or diaphoretic.  HENT:     Head: Normocephalic and atraumatic.     Right Ear: External ear normal.     Left Ear: External ear normal.     Nose: Nose normal.     Mouth/Throat:     Mouth: Mucous membranes are moist.  Eyes:     Extraocular Movements: Extraocular movements intact.     Conjunctiva/sclera: Conjunctivae normal.  Cardiovascular:     Rate and Rhythm: Normal rate and regular rhythm.   Pulmonary:     Effort: Pulmonary effort is normal. No respiratory distress.     Breath sounds: Normal breath sounds.  Chest:     Chest wall: No tenderness.  Abdominal:     General: There is no distension.     Palpations: Abdomen is soft.     Tenderness: There is no abdominal tenderness.  Musculoskeletal:        General: No swelling or deformity.     Cervical back: Normal range of motion and neck supple.  Skin:    General: Skin is warm and dry.     Coloration: Skin is not jaundiced or pale.  Neurological:     General: No focal deficit present.     Mental Status: She is alert and oriented to person, place, and time.  Psychiatric:        Mood and Affect: Mood normal.        Behavior: Behavior normal.     ED Results / Procedures / Treatments   Labs (all labs ordered are listed, but only abnormal results are displayed) Labs Reviewed  CBC - Abnormal; Notable for the following components:      Result Value   Hemoglobin 11.9 (*)    All other components within normal limits  I-STAT CHEM 8, ED - Abnormal; Notable for the following components:   BUN 37 (*)    Creatinine, Ser 2.40 (*)    Glucose, Bld 100 (*)    All other components within normal limits  COMPREHENSIVE METABOLIC PANEL  URINALYSIS, ROUTINE W REFLEX MICROSCOPIC  CK  MAGNESIUM  I-STAT CG4 LACTIC ACID, ED  TROPONIN I (HIGH SENSITIVITY)    EKG EKG Interpretation Date/Time:  Monday December 01 2022 15:05:18 EDT Ventricular Rate:  67 PR Interval:  182 QRS Duration:  88 QT Interval:  425 QTC Calculation: 449 R Axis:   49  Text Interpretation: Sinus rhythm Low voltage, precordial leads Confirmed by Gloris Manchester [phone]) on 12/01/2022 3:16:59 PM  Radiology DG Hip Unilat W or Wo Pelvis 2-3 Views Right  Addendum Date: 12/01/2022   ADDENDUM REPORT: 12/01/2022 10:25 ADDENDUM: Pelvis x-ray. History: Pain after fall Comparison 03/19/2021 EXAM: Pelvis and right hip three views IMPRESSION: Right hip arthroplasty.  Osteopenia  with chronic changes. Electronically Signed   By: Karen Kays M.D.   On: 12/01/2022 10:25   Result Date: 12/01/2022 CLINICAL DATA:  Pain after fall EXAM: DG HIP (WITH OR WITHOUT PELVIS) 2-3V RIGHT COMPARISON:  None Available. FINDINGS: No consolidation, pneumothorax or effusion. No edema. Normal cardiopericardial silhouette. Calcified aorta. Degenerative changes seen along the shoulders. Degenerative changes of the spine IMPRESSION: No acute cardiopulmonary disease. Electronically  Signed: By: Karen Kays M.D. On: 12/01/2022 10:18    Procedures Procedures    Medications Ordered in ED Medications  oxyCODONE-acetaminophen (PERCOCET/ROXICET) 5-325 MG per tablet 1 tablet (1 tablet Oral Given 12/01/22 0950)  oxyCODONE-acetaminophen (PERCOCET/ROXICET) 5-325 MG per tablet 1 tablet (1 tablet Oral Given 12/01/22 1421)  lactated ringers bolus 1,000 mL (1,000 mLs Intravenous New Bag/Given 12/01/22 1457)    ED Course/ Medical Decision Making/ A&P                                 Medical Decision Making Amount and/or Complexity of Data Reviewed Labs: ordered. Radiology: ordered. ECG/medicine tests: ordered.  Risk Prescription drug management.   This patient presents to the ED for concern of fall, this involves an extensive number of treatment options, and is a complaint that carries with it a high risk of complications and morbidity.  The differential diagnosis includes acute injuries   Co morbidities that complicate the patient evaluation  DM, CVA, HTN, arthritis, anxiety   Additional history obtained:  Additional history obtained from EMS External records from outside source obtained and reviewed including EMR   Imaging Studies ordered:  I ordered imaging studies including x-ray of chest, right hip and femur head, cervical spine, pelvis I independently visualized and interpreted imaging which showed no acute hip or pelvic findings identified, (remaining imaging studies pending at time of  signout) I agree with the radiologist interpretation   Cardiac Monitoring: / EKG:  The patient was maintained on a cardiac monitor.  I personally viewed and interpreted the cardiac monitored which showed an underlying rhythm of: Sinus rhythm  Problem List / ED Course / Critical interventions / Medication management  Patient presents for right hip pain following a mechanical fall during which she landed on her bottom.  She was unable to get to the feet with the help of her aide.  EMS was called.  Pain is described as throughout her right hip, greater on the posterior aspect.  She has undergone surgery to this hip in the past.  Per chart review, her orthopedic doctor is Dr. Charlann Boxer with [facility].  Percocet was ordered for analgesia.  Patient underwent right hip x-ray which did not show acute findings.  She subsequently underwent CT of pelvis which was also negative for acute osseous findings.  On reassessment, she had ongoing pain.  Additional Percocet was ordered.  Given her inability to stand, will check some lab work for other etiologies of her weakness.  CT of head was ordered as well.  I-STAT shows concern of AKI.  IV fluids were ordered.  Remaining lab work is pending at time of signout.  Care of patient was signed out to oncoming ED provider. I ordered medication including Percocet for analgesia; IV fluids for hydration Reevaluation of the patient after these medicines showed that the patient improved I have reviewed the patients home medicines and have made adjustments as needed   Social Determinants of Health:  Lives independently with home health aide 2 days/week and family support from daughter        Final Clinical Impression(s) / ED Diagnoses Final diagnoses:  Fall, initial encounter    Rx / DC Orders ED Discharge Orders     None         Gloris Manchester, MD 12/01/22 [phone]

## 2022-12-02 ENCOUNTER — Inpatient Hospital Stay (HOSPITAL_COMMUNITY): Payer: Medicare Other

## 2022-12-02 DIAGNOSIS — N179 Acute kidney failure, unspecified: Secondary | ICD-10-CM | POA: Diagnosis not present

## 2022-12-02 DIAGNOSIS — W19XXXA Unspecified fall, initial encounter: Secondary | ICD-10-CM | POA: Diagnosis not present

## 2022-12-02 LAB — BASIC METABOLIC PANEL
Anion gap: 8 (ref 5–15)
BUN: 33 mg/dL — ABNORMAL HIGH (ref 8–23)
CO2: 25 mmol/L (ref 22–32)
Calcium: 9.8 mg/dL (ref 8.9–10.3)
Chloride: 105 mmol/L (ref 98–111)
Creatinine, Ser: 1.93 mg/dL — ABNORMAL HIGH (ref 0.44–1.00)
GFR, Estimated: 27 mL/min — ABNORMAL LOW (ref >60)
Glucose, Bld: 101 mg/dL — ABNORMAL HIGH (ref 70–99)
Potassium: 4.1 mmol/L (ref 3.5–5.1)
Sodium: 138 mmol/L (ref 135–145)

## 2022-12-02 LAB — CBC
HCT: 34.5 % — ABNORMAL LOW (ref 36.0–46.0)
Hemoglobin: 10.7 g/dL — ABNORMAL LOW (ref 12.0–15.0)
MCH: 27.2 pg (ref 26.0–34.0)
MCHC: 31 g/dL (ref 30.0–36.0)
MCV: 87.8 fL (ref 80.0–100.0)
Platelets: 198 10*3/uL (ref 150–400)
RBC: 3.93 MIL/uL (ref 3.87–5.11)
RDW: 13.7 % (ref 11.5–15.5)
WBC: 5.7 10*3/uL (ref 4.0–10.5)
nRBC: 0 % (ref 0.0–0.2)

## 2022-12-02 LAB — MAGNESIUM: Magnesium: 2.2 mg/dL (ref 1.7–2.4)

## 2022-12-02 LAB — CK: Total CK: 170 U/L (ref 38–234)

## 2022-12-02 MED ORDER — ENOXAPARIN SODIUM 30 MG/0.3ML IJ SOSY
30.0000 mg | PREFILLED_SYRINGE | INTRAMUSCULAR | Status: DC
  Administered 2022-12-02: 30 mg via SUBCUTANEOUS
  Filled 2022-12-02: qty 0.3

## 2022-12-02 NOTE — Progress Notes (Signed)
PROGRESS NOTE    Sophia Rodriguez  DGL:[id number] DOB: September 26, 1950 DOA: 12/01/2022 PCP: Fleet Contras, MD  Outpatient Specialists:     Brief Narrative:  Patient is a 72 year old female with past medical history significant for CVA with right-sided weakness, diet controlled type 2 diabetes, essential hypertension, hyperlipidemia, CKD 3B, chronic diastolic CHF and chronic anxiety/depression.  Patient was admitted following a fall at home (cannot rule out orthostatic syncope).  Patient was on the floor for about 2 hours.  No loss of consciousness.  Associated right sided hip pain.  Patient has baseline chronic kidney disease stage IIIb, with serum creatinine of 1.4.  On presentation to the hospital, serum creatinine was 2.4.  With volume repletion, serum creatinine has improved to 1.3.  Will continue IV fluids for now.  Urinalysis revealed specific gravity of 1.015 on presentation.  No orthostatic vital signs visualized.  No other constitutional symptoms endorsed.  Patient has difficulty ambulating due to left foot pain.  Will proceed with x-ray of the left foot.  Meanwhile, we will continue telemetry monitoring, as well as, orthostatic vital signs checked.  Will follow renal function and electrolytes.  Suspect AKI is likely prerenal.   Assessment & Plan:   Principal Problem:   Fall   Fall versus syncope (query orthostatic syncope): -Felt dizzy prior to falling. -Rule out orthostatic syncope -Orthostatic vital signs. -Continue volume resuscitation.  Patient is on IV fluids. -Telemetry monitoring. -PT/OT input. -Will proceed with left foot x-ray.  Patient has left foot pain and difficulty ambulating or supporting self. -CT head and cervical spine nonacute. -X-ray right hip nonacute -CT pelvis, right hip arthroplasty, moderate degenerative changes. -UA negative for pyuria.  UA revealed sav1.015. -Chest x-ray nonacute. -Fall precautions   Right hip pain post fall X-ray of right hip showing  osteopenia, no acute osseous abnormality. As needed analgesics   Acute kidney injury on chronic kidney disease stage IIIb:  -Patient has baseline chronic kidney disease stage IIIb (see above documentation).   -Acute kidney injury is likely prerenal. -Likely volume depletion on presentation. -Continue IV fluids. -Acute kidney injury slowly improving. -Avoid nephrotoxins. -Keep MAP greater than 65 mmHg. -Monitor renal function and electrolytes. -Continue to hold diuretics and ACE inhibitor. -Strict I's and O's.    Renal ultrasound revealed: 1. Increased echogenicity of the bilateral kidneys as can be seen in medical renal disease. 2. Small amount of free fluid in the left lower quadrant.   Volume depletion: -Gentle hydration with IV fluid NS at 75 cc/h    Possible acute urinary retention: -See renal ultrasound result documented above. -Continue to monitor closely.     Chronic diastolic CHF: -Currently compensated.  -Last 2D echo done on 05/08/2020 revealed LVEF 50 to 55% with grade 1 diastolic dysfunction Continue to hold diuretics and ACE inhibitor due to acute kidney injury/volume depletion.  -Strict I's and O's and daily weight   History of CVA Resume home aspirin and statin   Chronic anxiety/depression Resume home escitalopram    Left foot pain/rule out fracture: -Patient has difficulty standing or weightbearing. -X-ray of the left foot   DVT prophylaxis: Subcutaneous Lovenox Code Status: Full code. Family Communication:  Disposition Plan: This will depend on hospital course.   Consultants:  None.  Procedures:  None.  Antimicrobials:  None.   Subjective: -Left foot pain. -Difficulties standing or weightbearing.  Objective: Vitals:   12/02/22 0130 12/02/22 0500 12/02/22 0512 12/02/22 1033  BP: (!) 104/56  101/60 105/70  Pulse: 96  80 76  Resp: 16  16 15   Temp: 98.2 F (36.8 C)  98.7 F (37.1 C) 98.1 F (36.7 C)  TempSrc: Oral  Oral   SpO2:  97%  100% 97%  Weight:  76 kg    Height:        Intake/Output Summary (Last 24 hours) at 12/02/2022 1117 Last data filed at 12/02/2022 0500 Gross per 24 hour  Intake 565.72 ml  Output --  Net 565.72 ml   Filed Weights   12/01/22 2105 12/02/22 0500  Weight: 83.9 kg 76 kg    Examination:  General exam: Appears calm and comfortable  Respiratory system: Clear to auscultation.  Cardiovascular system: S1 & S2 heard Gastrointestinal system: Abdomen is soft and nontender. No organomegaly or masses felt. Normal bowel sounds heard. Central nervous system: Awake and alert.  Moves all extremities.   Extremities: No leg edema.  Data Reviewed: I have personally reviewed following labs and imaging studies  CBC: Recent Labs  Lab 12/01/22 1424 12/01/22 1431 12/02/22 0345  WBC 6.4  --  5.7  HGB 11.9* 12.2 10.7*  HCT 38.3 36.0 34.5*  MCV 85.3  --  87.8  PLT 212  --  198   Basic Metabolic Panel: Recent Labs  Lab 12/01/22 1424 12/01/22 1431 12/02/22 0345  NA 138 140 138  K 4.7 5.0 4.1  CL 102 104 105  CO2 27  --  25  GLUCOSE 103* 100* 101*  BUN 36* 37* 33*  CREATININE 2.10* 2.40* 1.93*  CALCIUM 9.6  --  9.8  MG 2.3  --  2.2   GFR: Estimated Creatinine Clearance: 27.3 mL/min (A) (by C-G formula based on SCr of 1.93 mg/dL (H)). Liver Function Tests: Recent Labs  Lab 12/01/22 1424  AST 30  ALT 15  ALKPHOS 63  BILITOT 0.6  PROT 7.8  ALBUMIN 3.9   No results for input(s): "LIPASE", "AMYLASE" in the last 168 hours. No results for input(s): "AMMONIA" in the last 168 hours. Coagulation Profile: No results for input(s): "INR", "PROTIME" in the last 168 hours. Cardiac Enzymes: Recent Labs  Lab 12/01/22 1424  CKTOTAL 133   BNP (last 3 results) No results for input(s): "PROBNP" in the last 8760 hours. HbA1C: No results for input(s): "HGBA1C" in the last 72 hours. CBG: No results for input(s): "GLUCAP" in the last 168 hours. Lipid Profile: No results for input(s):  "CHOL", "HDL", "LDLCALC", "TRIG", "CHOLHDL", "LDLDIRECT" in the last 72 hours. Thyroid Function Tests: No results for input(s): "TSH", "T4TOTAL", "FREET4", "T3FREE", "THYROIDAB" in the last 72 hours. Anemia Panel: No results for input(s): "VITAMINB12", "FOLATE", "FERRITIN", "TIBC", "IRON", "RETICCTPCT" in the last 72 hours. Urine analysis:    Component Value Date/Time   COLORURINE YELLOW 12/01/2022 1723   APPEARANCEUR CLEAR 12/01/2022 1723   LABSPEC 1.015 12/01/2022 1723   PHURINE 5.0 12/01/2022 1723   GLUCOSEU NEGATIVE 12/01/2022 1723   HGBUR NEGATIVE 12/01/2022 1723   BILIRUBINUR NEGATIVE 12/01/2022 1723   KETONESUR NEGATIVE 12/01/2022 1723   PROTEINUR NEGATIVE 12/01/2022 1723   UROBILINOGEN 0.2 11/24/2012 0411   NITRITE NEGATIVE 12/01/2022 1723   LEUKOCYTESUR NEGATIVE 12/01/2022 1723   Sepsis Labs: @LABRCNTIP (procalcitonin:4,lacticidven:4)  )No results found for this or any previous visit (from the past 240 hour(s)).       Radiology Studies: US RENAL  Result Date: 12/01/2022 CLINICAL DATA:  Acute kidney injury EXAM: RENAL / URINARY TRACT ULTRASOUND COMPLETE COMPARISON:  None Available. FINDINGS: Right Kidney: Renal measurements: 9.1 x 4.3 x 4.6 cm = volume: 95 mL.  Echogenicity is increased. No mass or hydronephrosis visualized. Left Kidney: Renal measurements: 9.6 x 4.6 x 4.7 cm = volume: 180 mL. Echogenicity is increased. No mass or hydronephrosis visualized. Bladder: Appears normal for degree of bladder distention. Other: Small amount of free fluid in the left lower quadrant. IMPRESSION: 1. Increased echogenicity of the bilateral kidneys as can be seen in medical renal disease. 2. Small amount of free fluid in the left lower quadrant. Electronically Signed   By: Darliss Cheney M.D.   On: 12/01/2022 22:50   CT HEAD WO CONTRAST  Result Date: 12/01/2022 CLINICAL DATA:  Head trauma, pain EXAM: CT HEAD WITHOUT CONTRAST CT CERVICAL SPINE WITHOUT CONTRAST TECHNIQUE: Multidetector CT  imaging of the head and cervical spine was performed following the standard protocol without intravenous contrast. Multiplanar CT image reconstructions of the cervical spine were also generated. RADIATION DOSE REDUCTION: This exam was performed according to the departmental dose-optimization program which includes automated exposure control, adjustment of the mA and/or kV according to patient size and/or use of iterative reconstruction technique. COMPARISON:  None Available. FINDINGS: CT HEAD FINDINGS Brain: There is no acute intracranial hemorrhage, extra-axial fluid collection, or acute infarct. Background parenchymal volume is normal. There is remote infarct throughout the left ACA distribution with associated ex vacuo dilatation of the left lateral ventricle. There is an additional small remote infarct in the right cerebellar hemisphere. Gray-white differentiation is otherwise preserved. The ventricles are otherwise normal in size. The pituitary and suprasellar region are normal. There is no mass lesion. There is no mass effect or midline shift. Vascular: There is calcification of the bilateral carotid siphons. Skull: Normal. Negative for fracture or focal lesion. Sinuses/Orbits: The imaged paranasal sinuses are clear. The globes and orbits are unremarkable. Other: The mastoid air cells and middle ear cavities are clear. CT CERVICAL SPINE FINDINGS Alignment: Normal. Skull base and vertebrae: Skull base alignment is maintained. Vertebral body heights are preserved. There is no evidence of acute fracture. There is no suspicious osseous lesion. There are bulky flowing anterior osteophytes throughout the cervical spine consistent with diffuse idiopathic skeletal hyperostosis. Soft tissues and spinal canal: No prevertebral fluid or swelling. No visible canal hematoma. Disc levels: There is moderate multilevel disc space narrowing and degenerative endplate change throughout the cervical spine with bulky anterior  osteophytes. There is overall mild facet arthropathy. There is no high-grade spinal canal or neural foraminal stenosis. Upper chest: There is emphysema in the lung apices. Other: There is 1.8 cm left thyroid nodule, previously evaluated by ultrasound. IMPRESSION: 1. No acute intracranial pathology. 2. Remote infarct in the left ACA distribution and small remote infarct in the right cerebellar hemisphere. 3. No acute fracture or traumatic malalignment of the cervical spine. 4. Bulky flowing anterior osteophytes throughout the cervical spine consistent with diffuse idiopathic skeletal hyperostosis (DISH). 5. Emphysema. Electronically Signed   By: Lesia Hausen M.D.   On: 12/01/2022 16:33   CT Cervical Spine Wo Contrast  Result Date: 12/01/2022 CLINICAL DATA:  Head trauma, pain EXAM: CT HEAD WITHOUT CONTRAST CT CERVICAL SPINE WITHOUT CONTRAST TECHNIQUE: Multidetector CT imaging of the head and cervical spine was performed following the standard protocol without intravenous contrast. Multiplanar CT image reconstructions of the cervical spine were also generated. RADIATION DOSE REDUCTION: This exam was performed according to the departmental dose-optimization program which includes automated exposure control, adjustment of the mA and/or kV according to patient size and/or use of iterative reconstruction technique. COMPARISON:  None Available. FINDINGS: CT HEAD FINDINGS Brain: There  is no acute intracranial hemorrhage, extra-axial fluid collection, or acute infarct. Background parenchymal volume is normal. There is remote infarct throughout the left ACA distribution with associated ex vacuo dilatation of the left lateral ventricle. There is an additional small remote infarct in the right cerebellar hemisphere. Gray-white differentiation is otherwise preserved. The ventricles are otherwise normal in size. The pituitary and suprasellar region are normal. There is no mass lesion. There is no mass effect or midline shift.  Vascular: There is calcification of the bilateral carotid siphons. Skull: Normal. Negative for fracture or focal lesion. Sinuses/Orbits: The imaged paranasal sinuses are clear. The globes and orbits are unremarkable. Other: The mastoid air cells and middle ear cavities are clear. CT CERVICAL SPINE FINDINGS Alignment: Normal. Skull base and vertebrae: Skull base alignment is maintained. Vertebral body heights are preserved. There is no evidence of acute fracture. There is no suspicious osseous lesion. There are bulky flowing anterior osteophytes throughout the cervical spine consistent with diffuse idiopathic skeletal hyperostosis. Soft tissues and spinal canal: No prevertebral fluid or swelling. No visible canal hematoma. Disc levels: There is moderate multilevel disc space narrowing and degenerative endplate change throughout the cervical spine with bulky anterior osteophytes. There is overall mild facet arthropathy. There is no high-grade spinal canal or neural foraminal stenosis. Upper chest: There is emphysema in the lung apices. Other: There is 1.8 cm left thyroid nodule, previously evaluated by ultrasound. IMPRESSION: 1. No acute intracranial pathology. 2. Remote infarct in the left ACA distribution and small remote infarct in the right cerebellar hemisphere. 3. No acute fracture or traumatic malalignment of the cervical spine. 4. Bulky flowing anterior osteophytes throughout the cervical spine consistent with diffuse idiopathic skeletal hyperostosis (DISH). 5. Emphysema. Electronically Signed   By: Lesia Hausen M.D.   On: 12/01/2022 16:33   DG Femur Min 2 Views Right  Result Date: 12/01/2022 CLINICAL DATA:  Pain after fall EXAM: RIGHT FEMUR 2 VIEWS COMPARISON:  Pelvis right hip x-ray earlier 12/01/2018 FINDINGS: Proximal aspect of the femur is not included in the imaging field. What is seen of the femur demonstrates osteopenia. No fracture or dislocation. Preserved knee joint at the edge of the imaging  field. Prominent vascular calcifications. IMPRESSION: Limited x-ray. Osteopenia. No acute osseous abnormality. Please correlate with separate imaging. Electronically Signed   By: Karen Kays M.D.   On: 12/01/2022 11:55   CT PELVIS WO CONTRAST  Result Date: 12/01/2022 CLINICAL DATA:  Pain after trauma EXAM: CT PELVIS WITHOUT CONTRAST TECHNIQUE: Multidetector CT imaging of the pelvis was performed following the standard protocol without intravenous contrast. RADIATION DOSE REDUCTION: This exam was performed according to the departmental dose-optimization program which includes automated exposure control, adjustment of the mA and/or kV according to patient size and/or use of iterative reconstruction technique. COMPARISON:  X-ray 12/01/2022 FINDINGS: Urinary Tract:  Preserved contours of the urinary bladder. Bowel: On this non oral contrast exam the visualized portions of the small and large bowel are nondilated in the pelvis. There is mild-to-moderate left-sided colonic stool. Normal appendix in the right lower quadrant. Vascular/Lymphatic: Prominent vascular calcifications identified along the aorta and iliac vessels. No specific abnormal lymph node enlargement identified. Reproductive: Uterus is present. No separate adnexal mass. Small amount of free fluid in the pelvis. Other:  Small amount of free fluid in the pelvis.  Nonspecific. Musculoskeletal: Significant streak artifact related to patient's right hip arthroplasty. No obvious hardware failure. There is a screw fixated acetabular cup and Press-Fit femoral component. Mild degenerative changes seen of  the sacroiliac joints, lower lumbar spine. There is some concentric moderate joint space loss of the left hip with osteophytes and sclerosis. Global hyperostosis. No obvious fracture. IMPRESSION: Moderate degenerative changes. Right hip arthroplasty. Associated streak artifact obscuring the surrounding soft tissues and bony structures. Nonspecific free fluid in  the pelvis. Please correlate with clinical presentation. Electronically Signed   By: Karen Kays M.D.   On: 12/01/2022 11:49   DG Chest Portable 1 View  Result Date: 12/01/2022 CLINICAL DATA:  Un witnessed fall.  Right hip pain. EXAM: PORTABLE CHEST 1 VIEW COMPARISON:  Chest radiographs 12/26/2012 and chest CT 08/28/2020 FINDINGS: The cardiomediastinal silhouette is within normal limits. Aortic atherosclerosis is noted. No airspace consolidation, edema, sizable pleural effusion, or pneumothorax is identified. Degenerative changes are noted in the spine and at the shoulders. IMPRESSION: No active disease. Electronically Signed   By: Sebastian Ache M.D.   On: 12/01/2022 10:37   DG Hip Unilat W or Wo Pelvis 2-3 Views Right  Addendum Date: 12/01/2022   ADDENDUM REPORT: 12/01/2022 10:25 ADDENDUM: Pelvis x-ray. History: Pain after fall Comparison 03/19/2021 EXAM: Pelvis and right hip three views IMPRESSION: Right hip arthroplasty.  Osteopenia with chronic changes. Electronically Signed   By: Karen Kays M.D.   On: 12/01/2022 10:25   Result Date: 12/01/2022 CLINICAL DATA:  Pain after fall EXAM: DG HIP (WITH OR WITHOUT PELVIS) 2-3V RIGHT COMPARISON:  None Available. FINDINGS: No consolidation, pneumothorax or effusion. No edema. Normal cardiopericardial silhouette. Calcified aorta. Degenerative changes seen along the shoulders. Degenerative changes of the spine IMPRESSION: No acute cardiopulmonary disease. Electronically Signed: By: Karen Kays M.D. On: 12/01/2022 10:18        Scheduled Meds:  aspirin EC  325 mg Oral Daily   atorvastatin  40 mg Oral q1800   enoxaparin (LOVENOX) injection  30 mg Subcutaneous Q24H   escitalopram  20 mg Oral Daily   gabapentin  100 mg Oral TID   influenza vaccine adjuvanted  0.5 mL Intramuscular Tomorrow-1000   loratadine  10 mg Oral Daily   multivitamin with minerals  1 tablet Oral Daily   Continuous Infusions:  sodium chloride 75 mL/hr at 12/02/22 0500     LOS: 0  days    Time spent: 35 minutes.    Berton Mount, MD  [facility] Pager #: [phone] 7PM-7AM contact night coverage as above

## 2022-12-02 NOTE — Evaluation (Signed)
Occupational Therapy Evaluation Patient Details Name: Sophia Rodriguez MRN: [id number] DOB: December 04, 1950 Today's Date: 12/02/2022   History of Present Illness Pt is 72 yo female admitted on 12/01/22 after unwitnessed fall at home after feeling dizzy.  Pt with hx including but not limited to CVA with R sided weakness, DM2, HTN, HLD, CKD, CHF   Clinical Impression   The pt is currently presenting below her baseline level of functioning for self-care management, given the below listed deficits (see OT problem list). Today, she reported having 3/10 L foot pain at rest, and 6/10 L foot pain in standing; she reported pain to be new since her recent fall.  Due to pain, she was unable to take steps or participate in progressive out of bed ADLs. She will benefit from further OT services to maximize her ADL performance and to decrease the risk for restricted participation in meaningful activities. At current, the pt would have significant difficulty managing her self-care tasks if she were to return home at discharge (she lives alone). As such, short-term SNF rehab services are recommended.       If plan is discharge home, recommend the following: Assistance with cooking/housework;Assist for transportation;A lot of help with bathing/dressing/bathroom    Functional Status Assessment  Patient has had a recent decline in their functional status and demonstrates the ability to make significant improvements in function in a reasonable and predictable amount of time.  Equipment Recommendations  None recommended by OT    Recommendations for Other Services       Precautions / Restrictions Precautions Precautions: Fall Restrictions Weight Bearing Restrictions: No Other Position/Activity Restrictions: acute L foot pain, worse in weight-bearing      Mobility Bed Mobility Overal bed mobility: Needs Assistance Bed Mobility: Supine to Sit     Supine to sit: Min assist, HOB elevated, Used rails Sit to  supine: Supervision        Transfers Overall transfer level: Needs assistance Equipment used: Rolling walker (2 wheels) Transfers: Sit to/from Stand Sit to Stand: Min assist, From elevated surface           General transfer comment: Once in standing, pt was unable to take steps, due to reports of increased L foot pain described as "throbbing"      Balance       Sitting balance - Comments: static sitting-good. dynamic sitting-fair+     Standing balance-Leahy Scale: Poor             ADL either performed or assessed with clinical judgement   ADL Overall ADL's : Needs assistance/impaired Eating/Feeding: Independent;Sitting Eating/Feeding Details (indicate cue type and reason): based on clinical judgement Grooming: Set up;Sitting Grooming Details (indicate cue type and reason): simulated seated EOB Upper Body Bathing: Set up;Sitting Upper Body Bathing Details (indicate cue type and reason): based on clinical judgement Lower Body Bathing: Minimal assistance Lower Body Bathing Details (indicate cue type and reason): seated EOB, based on clinical judgement Upper Body Dressing : Set up;Sitting Upper Body Dressing Details (indicate cue type and reason): simulated seated EOB Lower Body Dressing: Moderate assistance;Sit to/from stand Lower Body Dressing Details (indicate cue type and reason): based on clinical judgement                      Pertinent Vitals/Pain Pain Assessment Pain Assessment: 0-10 Pain Score: 6  Pain Location: L foot with weight bearing and palpation Pain Descriptors / Indicators: Throbbing Pain Intervention(s): Limited activity within patient's tolerance, Monitored during session, Other (  comment) (Nurse and MD informed)     Extremity/Trunk Assessment Upper Extremity Assessment Upper Extremity Assessment: Right hand dominant;Overall [facility] for tasks assessed   Lower Extremity Assessment Lower Extremity Assessment: LLE deficits/detail LLE  Deficits / Details:  (6/10 L foot pain reported in standing. Pt unable to ambulate due to this. Pt reports pain is new since fall)         Cognition Arousal: Alert Behavior During Therapy: WFL for tasks assessed/performed Overall Cognitive Status: Possible intermittent difficulty with recall/memory, though deficit does not appear severe             General Comments: Oriented x4, able to follow 1-2 step commands consistently                Home Living Family/patient expects to be discharged to:: Private residence Living Arrangements: Alone Available Help at Discharge: Personal care attendant Type of Home: Apartment Home Access: Level entry     Home Layout: One level     Bathroom Shower/Tub: Chief Strategy Officer: Handicapped height     Home Equipment: Agricultural consultant (2 wheels);Rollator (4 wheels);Cane - single point;Tub bench;Wheelchair - manual          Prior Functioning/Environment Prior Level of Function : Needs assist             Mobility Comments:  She used a RW for household ambulation.  ADLs Comments:  (She has home health aides 3 days a week for 2-3 hrs each day, who assist with cleaning and laundry. She was modified independent to independent with ADLs. She does not drive.         OT Problem List: Impaired balance (sitting and/or standing);Decreased knowledge of use of DME or AE;Pain;Decreased activity tolerance      OT Treatment/Interventions: Self-care/ADL training;Therapeutic exercise;Energy conservation;DME and/or AE instruction;Therapeutic activities;Balance training;Patient/family education    OT Goals(Current goals can be found in the care plan section) Acute Rehab OT Goals Patient Stated Goal: to get better and return to normal activities OT Goal Formulation: With patient Time For Goal Achievement: 12/16/22 Potential to Achieve Goals: Good ADL Goals Pt Will Perform Lower Body Dressing: with supervision;sit to/from stand Pt  Will Transfer to Toilet: with supervision Pt Will Perform Toileting - Clothing Manipulation and hygiene: with supervision;sit to/from stand  OT Frequency: Min 1X/week       AM-PAC OT "6 Clicks" Daily Activity     Outcome Measure Help from another person eating meals?: None Help from another person taking care of personal grooming?: A Little Help from another person toileting, which includes using toliet, bedpan, or urinal?: A Lot Help from another person bathing (including washing, rinsing, drying)?: A Lot Help from another person to put on and taking off regular upper body clothing?: A Little Help from another person to put on and taking off regular lower body clothing?: A Lot 6 Click Score: 16   End of Session Equipment Utilized During Treatment: Rolling walker (2 wheels) Nurse Communication: Other (comment) (acute L foot pain which is worse in standing)  Activity Tolerance: Patient limited by pain Patient left: in bed;with call bell/phone within reach;with chair alarm set;with nursing/sitter in room  OT Visit Diagnosis: Unsteadiness on feet (R26.81);Pain Pain - Right/Left: Left Pain - part of body:  (foot)                Time: 4742-5956 OT Time Calculation (min): 21 min Charges:  OT General Charges $OT Visit: 1 Visit OT Evaluation $OT Eval Moderate Complexity:  1 Mod    Reuben Likes, OTR/L 12/02/2022, 2:55 PM

## 2022-12-02 NOTE — Plan of Care (Signed)

## 2022-12-02 NOTE — Evaluation (Signed)
Physical Therapy Evaluation Patient Details Name: Sophia Rodriguez MRN: [id number] DOB: 15-Apr-1950 Today's Date: 12/02/2022  History of Present Illness  Pt is 72 yo female admitted on 12/01/22 after unwitnessed fall after feeling dizzy.  Pt with hx including but not limited to CVA with R sided weakness and R facial droop, DM2, HTN, HLD, CKD, CHF  Clinical Impression  Pt admitted with above diagnosis.  At baseline, pt lives alone with intermittent support.  She typically ambulates with a RW in her home.  She has had 2 falls in the past year with similar causes (leaning over, coming up, and developing dizziness).  Today, pt had dizziness that she described as lightheaded and spinning with position changes.  She did have a drop in BP with standing (see below).  BPPV testing was negative, but pt did have gaze induced nystagmus and dizziness with gaze stabilization.  She reports having tinnitus, "inner ear issues", and hx of CVA.  Dizziness consistent with orthostatic hypotension and potential vestibular hypofunction.  Additionally, pt with c/o L foot pain that increased with weight bearing limiting her mobility. Reports entire foot sore with no erythema or edema.  She required up to mod A for transfers and was only able to take a few steps to/from [facility].  Pt with potential to progress but at this time recommend that Patient will benefit from continued inpatient follow up therapy, <3 hours/day at d/c as she is alone most of the time.   Pt currently with functional limitations due to the deficits listed below (see PT Problem List). Pt will benefit from acute skilled PT to increase their independence and safety with mobility to allow discharge.           If plan is discharge home, recommend the following: A little help with walking and/or transfers;A little help with bathing/dressing/bathroom;Assistance with cooking/housework;Help with stairs or ramp for entrance   Can travel by private vehicle   Yes     Equipment Recommendations None recommended by PT  Recommendations for Other Services       Functional Status Assessment Patient has had a recent decline in their functional status and demonstrates the ability to make significant improvements in function in a reasonable and predictable amount of time.     Precautions / Restrictions Precautions Precautions: Fall      Mobility  Bed Mobility Overal bed mobility: Needs Assistance Bed Mobility: Supine to Sit, Sit to Supine     Supine to sit: Mod assist Sit to supine: Min assist   General bed mobility comments: Mod A to scoot forward to EOB.  Pt did have dizziness upon sitting that eased within 1 min    Transfers Overall transfer level: Needs assistance Equipment used: Rolling walker (2 wheels) Transfers: Sit to/from Stand, Bed to chair/wheelchair/BSC Sit to Stand: Min assist, Mod assist   Step pivot transfers: Min assist       General transfer comment: Pt requiring mod A to rise from bed and min A from [facility] with increased time.  She was able to take a few steps to and from [facility] with min A.  Limited by L foot pain.    Ambulation/Gait Ambulation/Gait assistance: Min assist Gait Distance (Feet): 3 Feet Assistive device: Rolling walker (2 wheels) Gait Pattern/deviations: Step-to pattern, Decreased stride length       General Gait Details: See above.  Pt with L foot pain that limited and her R side is weak from prior CVA  Stairs  Wheelchair Mobility     Tilt Bed    Modified Rankin (Stroke Patients Only)       Balance Overall balance assessment: Needs assistance Sitting-balance support: Bilateral upper extremity supported Sitting balance-Leahy Scale: Poor Sitting balance - Comments: supervision but using UE   Standing balance support: Bilateral upper extremity supported Standing balance-Leahy Scale: Poor Standing balance comment: min a                             Pertinent  Vitals/Pain Pain Assessment Pain Assessment: 0-10 Pain Score: 4  Pain Location: L foot with weight bearing and palpation Pain Descriptors / Indicators: Sore Pain Intervention(s): Limited activity within patient's tolerance, Monitored during session, Premedicated before session, Repositioned    Home Living Family/patient expects to be discharged to:: Private residence Living Arrangements: Alone Available Help at Discharge: Family;Personal care attendant;Available PRN/intermittently (Aide comes M and F for 2 hr day; daughter comes 1-2 x week) Type of Home: Apartment Home Access: Level entry       Home Layout: One level Home Equipment: Shower seat;Toilet riser;Rolling Walker (2 wheels);Cane - single point;Wheelchair - Physiological scientist (4 wheels);BSC/3in1      Prior Function               Mobility Comments: Pt walks with RW and rollator or w/c for community.  Pt admitted for a fall and last fall prior to that was December 2023 ADLs Comments: Has aide 2days a week for 2hours each, helps with housework, cleaning, chores; pt is IND for ADLS (does have someone there when bathing); daughter sets up meds; pt can do light iadls (fixing easy meals)     Extremity/Trunk Assessment   Upper Extremity Assessment Upper Extremity Assessment: Generalized weakness    Lower Extremity Assessment Lower Extremity Assessment: LLE deficits/detail;Generalized weakness LLE Deficits / Details: Pt with c/o pain in L foot when going to stand.  With palpation entire foot was sore rating at 4/10.  She did not have any edema or erythema and no specific point of soreness.  Pt reports potential sore from scooting around on floor.  States she fell on her bottom and did not twist foot    Cervical / Trunk Assessment Cervical / Trunk Assessment: Other exceptions Cervical / Trunk Exceptions: Forward head; Facial droop (chronic)  Communication   Communication Communication: Difficulty communicating  thoughts/reduced clarity of speech (does frequently say "no" but then corrects to "yes" when she is talking about pain or dizziness; hx prior CVA)  Cognition Arousal: Alert Behavior During Therapy: WFL for tasks assessed/performed Overall Cognitive Status: Within Functional Limits for tasks assessed                                 General Comments: oriented x 4; follows commands; does frequently say "no" but then corrects to "yes" when she is talking about pain or dizziness        General Comments Vestibular   History: Pt reports she got dizzy when went to fridge at night.  Bent down to get milk, stood up  and got dizzy.  Reports similar episode Dec 23 when she bent over and then dizzy when stood up.  Describes as spinning and lightheaded.  Pt does report hx of "inner ear" issues and tinnitus that PCP gave drops for.      Orthostatic Blood Pressure: supine 107/66 with HR 84 Sit 123/78 HR  90 Sit 3 min 128/65 HR 90 Stand 104/75 HR 100 Stand 3 min 106/68 HR 96 Pt had dizziness with all transfers that eased with rest.  Vestibular Testing: EOEM: intact but did have R beating nystagmus with R side gaze, + dizziness Saccades: intact, no symptoms Gaze Stabilization: intact, only able to tolerate slow; +dizziness Test of Skew: negative Head Thrust: negative Lucious [place] Dix: negative Horizontal Roll: negative    Exercises     Assessment/Plan    PT Assessment Patient needs continued PT services  PT Problem List Decreased strength;Decreased range of motion;Decreased activity tolerance;Decreased balance;Decreased mobility;Decreased knowledge of precautions;Cardiopulmonary status limiting activity       PT Treatment Interventions DME instruction;Therapeutic exercise;Gait training;Balance training;Neuromuscular re-education;Functional mobility training;Therapeutic activities;Patient/family education;Modalities    PT Goals (Current goals can be found in the Care Plan section)   Acute Rehab PT Goals Patient Stated Goal: return home PT Goal Formulation: With patient Time For Goal Achievement: 12/16/22 Potential to Achieve Goals: Good    Frequency Min 1X/week     Co-evaluation               AM-PAC PT "6 Clicks" Mobility  Outcome Measure Help needed turning from your back to your side while in a flat bed without using bedrails?: A Little Help needed moving from lying on your back to sitting on the side of a flat bed without using bedrails?: A Lot Help needed moving to and from a bed to a chair (including a wheelchair)?: A Little Help needed standing up from a chair using your arms (e.g., wheelchair or bedside chair)?: A Lot Help needed to walk in hospital room?: Total Help needed climbing 3-5 steps with a railing? : Total 6 Click Score: 12    End of Session Equipment Utilized During Treatment: Gait belt Activity Tolerance: Patient limited by pain Patient left: in bed;with call bell/phone within reach;with bed alarm set Nurse Communication: Mobility status;Other (comment) (orthostatic hypotension (mild)) PT Visit Diagnosis: Muscle weakness (generalized) (M62.81);Other abnormalities of gait and mobility (R26.89);History of falling (Z91.81);Dizziness and giddiness (R42)    Time: 1610-9604 PT Time Calculation (min) (ACUTE ONLY): 63 min   Charges:   PT Evaluation $PT Eval Moderate Complexity: 1 Mod PT Treatments $Therapeutic Activity: 38-52 mins PT General Charges $$ ACUTE PT VISIT: 1 Visit         Anise Salvo, PT Acute Rehab [facility] Rehab [phone]   Rayetta Humphrey 12/02/2022, 1:55 PM

## 2022-12-03 ENCOUNTER — Observation Stay (HOSPITAL_COMMUNITY): Payer: Medicare Other

## 2022-12-03 DIAGNOSIS — W19XXXA Unspecified fall, initial encounter: Secondary | ICD-10-CM

## 2022-12-03 DIAGNOSIS — M25551 Pain in right hip: Secondary | ICD-10-CM | POA: Diagnosis not present

## 2022-12-03 DIAGNOSIS — N179 Acute kidney failure, unspecified: Secondary | ICD-10-CM | POA: Insufficient documentation

## 2022-12-03 DIAGNOSIS — M25559 Pain in unspecified hip: Secondary | ICD-10-CM | POA: Insufficient documentation

## 2022-12-03 DIAGNOSIS — M79672 Pain in left foot: Secondary | ICD-10-CM | POA: Insufficient documentation

## 2022-12-03 LAB — BASIC METABOLIC PANEL
Anion gap: 6 (ref 5–15)
BUN: 19 mg/dL (ref 8–23)
CO2: 26 mmol/L (ref 22–32)
Calcium: 9.1 mg/dL (ref 8.9–10.3)
Chloride: 106 mmol/L (ref 98–111)
Creatinine, Ser: 1.6 mg/dL — ABNORMAL HIGH (ref 0.44–1.00)
GFR, Estimated: 34 mL/min — ABNORMAL LOW (ref >60)
Glucose, Bld: 150 mg/dL — ABNORMAL HIGH (ref 70–99)
Potassium: 4.4 mmol/L (ref 3.5–5.1)
Sodium: 138 mmol/L (ref 135–145)

## 2022-12-03 MED ORDER — ENOXAPARIN SODIUM 40 MG/0.4ML IJ SOSY
40.0000 mg | PREFILLED_SYRINGE | INTRAMUSCULAR | Status: DC
  Administered 2022-12-03 – 2022-12-04 (×2): 40 mg via SUBCUTANEOUS
  Filled 2022-12-03 (×2): qty 0.4

## 2022-12-03 NOTE — Hospital Course (Addendum)
72 year old woman presented after mechanical fall at home.  Right hip pain.  Imaging was unrevealing.  Admitted for acute kidney injury and further evaluation of hip pain. Plan for SNF.  Consultants None  Procedures None

## 2022-12-03 NOTE — NC FL2 (Signed)
[place] MEDICAID FL2 LEVEL OF CARE FORM     IDENTIFICATION  Patient Name: Sophia Rodriguez Birthdate: [birth year] Sex: female Admission Date (Current Location): 12/01/2022  [facility] and [place] Number:  Producer, television/film/video and Address:  [facility],  [address] 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   Right hemiparesis (HCC) 05/10/2014   Abnormality of gait 05/10/2014   Closed fracture of right hip (HCC) 11/24/2012   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   Stroke (HCC)    Hypertension     Orientation RESPIRATION BLADDER Height & Weight     Self, Time, Situation, Place  Normal Continent Weight: 178 lb 2.1 oz (80.8 kg) Height:  5\' 5"  (165.1 cm)  BEHAVIORAL SYMPTOMS/MOOD NEUROLOGICAL BOWEL NUTRITION STATUS   (N/A)   Continent Diet (Heart healthy diet)  AMBULATORY STATUS COMMUNICATION OF NEEDS Skin   Limited Assist Verbally Other (Comment) (Burn on right leg)                       Personal Care Assistance Level of Assistance  Bathing, Feeding, Dressing Bathing Assistance: Limited assistance Feeding assistance: Independent Dressing Assistance: Limited assistance     Functional Limitations Info  Sight, Hearing, Speech Sight Info: Impaired Hearing Info:  Adequate Speech Info: Adequate    SPECIAL CARE FACTORS FREQUENCY  PT (By licensed PT), OT (By licensed OT)     PT Frequency: 5x's/week OT Frequency: 5x's/week            Contractures Contractures Info: Not present    Additional Factors Info  Code Status, Allergies, Psychotropic Code Status Info: Full Allergies Info: NKA Psychotropic Info: Lexapro         Current Medications (12/03/2022):  This is the current hospital active medication list Current Facility-Administered Medications  Medication Dose Route Frequency Provider Last Rate Last Admin   acetaminophen (TYLENOL) tablet 650 mg  650 mg Oral Q6H PRN Darlin Drop, DO   650 mg at 12/02/22 2117   aspirin EC tablet 325 mg  325 mg Oral Daily Darlin Drop, DO   325 mg at 12/03/22 0944   atorvastatin (LIPITOR) tablet 40 mg  40 mg Oral q1800 Darlin Drop, DO   40 mg at 12/02/22 1826   enoxaparin (LOVENOX) injection 30 mg  30 mg Subcutaneous Q24H Norva Pavlov, RPH   30 mg at 12/02/22 2121   escitalopram (LEXAPRO) tablet 20 mg  20 mg Oral Daily Dow Adolph N, DO   20 mg at 12/03/22 0945   gabapentin (NEURONTIN) capsule 100 mg  100 mg Oral TID Dow Adolph N, DO   100 mg at 12/03/22 0944   influenza vaccine adjuvanted (FLUAD) injection 0.5 mL  0.5 mL Intramuscular Tomorrow-1000 Luiz Iron, NP  loratadine (CLARITIN) tablet 10 mg  10 mg Oral Daily Dow Adolph N, DO   10 mg at 12/02/22 1012   melatonin tablet 5 mg  5 mg Oral QHS PRN Dow Adolph N, DO   5 mg at 12/02/22 2120   multivitamin with minerals tablet 1 tablet  1 tablet Oral Daily Dow Adolph N, DO   1 tablet at 12/03/22 0944   polyethylene glycol (MIRALAX / GLYCOLAX) packet 17 g  17 g Oral Daily PRN Dow Adolph N, DO       prochlorperazine (COMPAZINE) injection 5 mg  5 mg Intravenous Q6H PRN Darlin Drop, DO         Discharge Medications: Please see discharge summary for a list of discharge medications.  Relevant Imaging Results:  Relevant  Lab Results:   Additional Information SSN: [id number]  Ewing Schlein, LCSW

## 2022-12-03 NOTE — Care Management Obs Status (Signed)
MEDICARE OBSERVATION STATUS NOTIFICATION   Patient Details  Name: Sophia Rodriguez MRN: [id number] Date of Birth: 11-16-1950   Medicare Observation Status Notification Given:  Yes    Ewing Schlein, LCSW 12/03/2022, 10:55 AM

## 2022-12-03 NOTE — TOC Initial Note (Signed)
Transition of Care [facility]) - Initial/Assessment Note   Patient Details  Name: Sophia Rodriguez MRN: [id number] Date of Birth: 05-May-1950  Transition of Care [facility]) CM/SW Contact:    Ewing Schlein, LCSW Phone Number: 12/03/2022, 10:56 AM  Clinical Narrative: PT evaluation recommended SNF. Patient agreeable to rehab. FL2 done; PASRR confirmed. Initial referral faxed to facilities in hub. TOC awaiting bed offers.  Expected Discharge Plan: Skilled Nursing Facility Barriers to Discharge: Continued Medical Work up  Patient Goals and CMS Choice Patient states their goals for this hospitalization and ongoing recovery are:: Go to SNF CMS Medicare.gov Compare Post Acute Care list provided to:: Patient Choice offered to / list presented to : Patient  Expected Discharge Plan and Services In-house Referral: Clinical Social Work Post Acute Care Choice: Skilled Nursing Facility Living arrangements for the past 2 months: Apartment             DME Arranged: N/A DME Agency: NA  Prior Living Arrangements/Services Living arrangements for the past 2 months: Apartment Patient language and need for interpreter reviewed:: Yes Do you feel safe going back to the place where you live?: Yes      Need for Family Participation in Patient Care: No (Comment) Care giver support system in place?: Yes (comment) Criminal Activity/Legal Involvement Pertinent to Current Situation/Hospitalization: No - Comment as needed  Activities of Daily Living Home Assistive Devices/Equipment: Dan Humphreys (specify type) ADL Screening (condition at time of admission) Patient's cognitive ability adequate to safely complete daily activities?: Yes Is the patient deaf or have difficulty hearing?: No Does the patient have difficulty seeing, even when wearing glasses/contacts?: No Does the patient have difficulty concentrating, remembering, or making decisions?: No Patient able to express need for assistance with ADLs?: Yes Does the patient  have difficulty dressing or bathing?: No Independently performs ADLs?: Yes (appropriate for developmental age) Does the patient have difficulty walking or climbing stairs?: No Weakness of Legs: Both Weakness of Arms/Hands: Both  Permission Sought/Granted Permission sought to share information with : Facility Industrial/product designer granted to share information with : Yes, Verbal Permission Granted Permission granted to share info w AGENCY: SNFs  Emotional Assessment Appearance:: Appears stated age Attitude/Demeanor/Rapport: Engaged Affect (typically observed): Accepting Orientation: : Oriented to Self, Oriented to Place, Oriented to  Time, Oriented to Situation Alcohol / Substance Use: Not Applicable Psych Involvement: No (comment)  Admission diagnosis:  Fall [W19.XXXA] Acute kidney injury (HCC) [N17.9] Fall, initial encounter [W19.XXXA] Patient Active Problem List   Diagnosis Date Noted   Fall 12/01/2022   S/P total right hip arthroplasty, posterior 11/07/2021   History of stroke 07/31/2017   Muscle spasticity 10/04/2015   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 [place] [address] Phone: [phone] Fax: [phone]  Social Determinants of Health (SDOH) Social History: SDOH Screenings   Food Insecurity: No Food Insecurity (12/01/2022)  Housing: Low Risk  (12/01/2022)  Transportation Needs: No Transportation Needs (12/01/2022)  Utilities: Not At Risk (12/01/2022)  Depression (PHQ2-9): Low Risk  (08/12/2018)  Social  Connections: Unknown (08/02/2021)   Received from [facility]  Tobacco Use: Medium Risk (12/01/2022)   SDOH Interventions:    Readmission Risk Interventions    11/08/2021   10:02 AM  Readmission Risk Prevention Plan  Post Dischage Appt Complete  Medication Screening Complete  Transportation Screening Complete

## 2022-12-03 NOTE — Progress Notes (Signed)
Physical Therapy Treatment Patient Details Name: Sophia Rodriguez MRN: [id number] DOB: 1951-03-01 Today's Date: 12/03/2022   History of Present Illness Pt is 72 yo female admitted on 12/01/22 after unwitnessed fall after feeling dizzy.  Pt with hx including but not limited to CVA with R sided weakness and R facial droop, DM2, HTN, HLD, CKD, CHF    PT Comments  Noting pt with L foot xray with abnormalities (see below) so limited weight on foot during treatment.  Treatment focused on bed mobility, transfer to chair, monitoring for orthostatic BP, and providing with gaze stabilization exercises and compensation techniques for dizziness.  He orthostatic BP were negative.  She required min to mod A for pivot to chair and was able to tolerate gaze stabilization exercises at slower speed.  Do continue to recommend Patient will benefit from continued inpatient follow up therapy, <3 hours/day at d/c.  L Foot:  Noted pt with xray to L foot with impression: "Subtle linear lucency along the proximal metaphysis of the third metatarsal is thought to likely be from osseous ridging, if the patient has point tenderness over the Lisfranc joint then CT of the foot would be recommended to exclude a fracture."  During PT pt reports that foot pain increased and she has been unable to weight bear on it.  Palpated foot.  Pt does have aphasia and will frequently say what she does not mean but then corrects - so gave extra time for her to reply if painful or not.  Did seem to have tenderness over entire mid foot but was worst near 3rd metatarsal. Notified Dr Irene Limbo.   If plan is discharge home, recommend the following: A little help with walking and/or transfers;A little help with bathing/dressing/bathroom;Assistance with cooking/housework;Help with stairs or ramp for entrance   Can travel by private vehicle     Yes  Equipment Recommendations  None recommended by PT    Recommendations for Other Services        Precautions / Restrictions Precautions Precautions: Fall Restrictions Weight Bearing Restrictions: No Other Position/Activity Restrictions: No formal precautions but noted foot xray with subtle lucency over 3rd metatarsal (pt tender this area) so had her keep weight off for transfers only at this time - MD aware and will follow up     Mobility  Bed Mobility Overal bed mobility: Needs Assistance Bed Mobility: Supine to Sit     Supine to sit: Min assist, Used rails, HOB elevated     General bed mobility comments: Increased time  with light min A to scoot.  Did have some dizziness - BP was stable, cued for focus point    Transfers Overall transfer level: Needs assistance Equipment used: Rolling walker (2 wheels) Transfers: Sit to/from Stand Sit to Stand: Min assist, From elevated surface Stand pivot transfers: Min assist         General transfer comment: Increased time, cues for hand placement, cues to use R LE and limit weight on L LE (pt has pain with attempts to weight bear)    Ambulation/Gait               General Gait Details: deferred due to L Foot pain   Stairs             Wheelchair Mobility     Tilt Bed    Modified Rankin (Stroke Patients Only)       Balance Overall balance assessment: Needs assistance Sitting-balance support: No upper extremity supported Sitting balance-Leahy Scale: Fair  Standing balance support: Bilateral upper extremity supported Standing balance-Leahy Scale: Poor Standing balance comment: RW and min A                            Cognition Arousal: Alert Behavior During Therapy: WFL for tasks assessed/performed Overall Cognitive Status: Within Functional Limits for tasks assessed                                 General Comments: Pleasant, motivated, oriented, and follows commands.  Pt does have hx of aphasia from CVA and will say incorrect answer first but then corrects self with time  (particularly with yes/no or choices) - she is aware and just needs extra time        Exercises Other Exercises Other Exercises: Gaze Stabilization exercises: R/L 10x2; up/down 10x1; only able to go at slow speed, +dizziness with R/L worse than up/down.  Educated could practice these on her own in recliner or bed.    General Comments General comments (skin integrity, edema, etc.):   Noted pt with xray to L foot with impression: Subtle linear lucency along the proximal metaphysis of the third metatarsal is thought to likely be from osseous ridging, if the patient has point tenderness over the Lisfranc joint then CT of the foot would be recommended to exclude a fracture.  During PT pt reports that foot pain increased and she has been unable to weight bear on it.  Palpated foot.  Pt does have aphasia and will frequently say what she does not mean but then corrects - so gave extra time for her to reply if painful or not.  Did seem to have tenderness over entire mid foot but was worst near 3rd metatarsal.    Pt had some dizziness with transfers.  BP was stable 130's/70's in all positions.  Cued for slow transitions with focus points. Performed gaze stabilization exercises for habituation.      Pertinent Vitals/Pain Pain Assessment Pain Assessment: Faces Faces Pain Scale: Hurts even more Pain Location: L foot with weight bearing and palpation Pain Descriptors / Indicators: Discomfort, Grimacing, Guarding Pain Intervention(s): Limited activity within patient's tolerance, Monitored during session, Repositioned, Ice applied, Other (comment) (limited weight bearing)    Home Living                          Prior Function            PT Goals (current goals can now be found in the care plan section) Acute Rehab PT Goals Patient Stated Goal: return home PT Goal Formulation: With patient Time For Goal Achievement: 12/16/22 Progress towards PT goals: Progressing toward goals     Frequency    Min 1X/week      PT Plan      Co-evaluation              AM-PAC PT "6 Clicks" Mobility   Outcome Measure  Help needed turning from your back to your side while in a flat bed without using bedrails?: A Little Help needed moving from lying on your back to sitting on the side of a flat bed without using bedrails?: A Lot Help needed moving to and from a bed to a chair (including a wheelchair)?: A Little Help needed standing up from a chair using your arms (e.g., wheelchair or bedside chair)?: A Lot  Help needed to walk in hospital room?: Total Help needed climbing 3-5 steps with a railing? : Total 6 Click Score: 12    End of Session Equipment Utilized During Treatment: Gait belt Activity Tolerance: Patient limited by pain Patient left: with call bell/phone within reach;with chair alarm set;in chair Nurse Communication: Mobility status (notified of incerased foot pain, PT notified MD, would limit weight on L LE and transfers only at this time) PT Visit Diagnosis: Muscle weakness (generalized) (M62.81);Other abnormalities of gait and mobility (R26.89);History of falling (Z91.81);Dizziness and giddiness (R42)     Time: 8416-6063 PT Time Calculation (min) (ACUTE ONLY): 31 min  Charges:    $Therapeutic Activity: 8-22 mins $Neuromuscular Re-education: 8-22 mins PT General Charges $$ ACUTE PT VISIT: 1 Visit                     Anise Salvo, PT Acute Rehab [facility] [phone]    Rayetta Humphrey 12/03/2022, 5:29 PM

## 2022-12-03 NOTE — Progress Notes (Signed)
  Progress Note   Patient: Sophia Rodriguez [id number]:[id number] DOB: March 02, 1951 DOA: 12/01/2022     1 DOS: the patient was seen and examined on 12/03/2022   Brief hospital course: 72 year old woman presented after mechanical fall at home.  Right hip pain.  Imaging was unrevealing.  Admitted for acute kidney injury and further evaluation of hip pain. Plan for SNF.  Consultants None  Procedures None  Assessment and Plan: Fall at home Right hip pain Felt dizzy prior to falling, no loss of consciousness, therefore syncope ruled out.  Orthostatics were negative. Etiology unclear.  CT head and neck unrevealing. Imaging of the hip was negative. Continue PT, OT.  Plan for SNF.   Acute kidney injury on chronic kidney disease stage IIIb  Baseline creatinine about 1.6. Etiology unclear but appears to be back to baseline at this point.  Renal ultrasound was unrevealing. Continue to hold ACE inhibitor and diuretic  Left foot pain/rule out fracture: X-ray of left foot cannot rule out fracture and area of tenderness.  Will proceed with CT foot.  Patient remained symptomatic.   Volume depletion: Resolved.   Acute urinary retention Resolved   Chronic diastolic CHF: Appears compensated. Last 2D echo done on 05/08/2020 revealed LVEF 50 to 55% with grade 1 diastolic dysfunction Continue to hold diuretics and ACE inhibitor due to acute kidney injury/volume depletion.    PMH CVA Continue aspirin and statin   Chronic anxiety/depression Continue home escitalopram     Plan for SNF, likely in the next 2 days.     Subjective:  Feels ok Has left foot pain when bearing weight  Physical Exam: Vitals:   12/02/22 2323 12/03/22 0500 12/03/22 0610 12/03/22 1342  BP: 94/60  112/69 115/68  Pulse: 79  72 83  Resp: 17  17 16   Temp: 98.7 F (37.1 C)  97.9 F (36.6 C) 98.5 F (36.9 C)  TempSrc: Oral  Oral Oral  SpO2: 100%  100% 100%  Weight:  80.8 kg    Height:       Physical Exam Vitals  reviewed.  Constitutional:      General: She is not in acute distress.    Appearance: She is not ill-appearing or toxic-appearing.  Cardiovascular:     Rate and Rhythm: Normal rate and regular rhythm.     Heart sounds: No murmur heard. Pulmonary:     Effort: Pulmonary effort is normal. No respiratory distress.     Breath sounds: No wheezing, rhonchi or rales.  Musculoskeletal:     Comments: Pain w/ palpation left plantar surface, laterally, midfoot  Neurological:     Mental Status: She is alert.  Psychiatric:        Mood and Affect: Mood normal.        Behavior: Behavior normal.     Data Reviewed: Creatinine down to 1.60  Family Communication: none  Disposition: Status is: Observation      Time spent: 35 minutes  Author: Brendia Sacks, MD 12/03/2022 6:01 PM  For on call review [email or web address].

## 2022-12-03 NOTE — Care Management CC44 (Signed)
Condition Code 44 Documentation Completed  Patient Details  Name: Sophia Rodriguez MRN: [id number] Date of Birth: 1950-08-03   Condition Code 44 given:  Yes Patient signature on Condition Code 44 notice:  Yes Documentation of 2 MD's agreement:  Yes Code 44 added to claim:  Yes    Ewing Schlein, LCSW 12/03/2022, 10:55 AM

## 2022-12-04 DIAGNOSIS — W19XXXA Unspecified fall, initial encounter: Secondary | ICD-10-CM | POA: Diagnosis not present

## 2022-12-04 DIAGNOSIS — M79672 Pain in left foot: Secondary | ICD-10-CM | POA: Diagnosis not present

## 2022-12-04 DIAGNOSIS — M25551 Pain in right hip: Secondary | ICD-10-CM | POA: Diagnosis not present

## 2022-12-04 DIAGNOSIS — N179 Acute kidney failure, unspecified: Secondary | ICD-10-CM | POA: Diagnosis not present

## 2022-12-04 NOTE — TOC Progression Note (Signed)
Transition of Care [facility]) - Progression Note   Patient Details  Name: Fenna Fennema MRN: [id number] Date of Birth: [birth year]  [facility] CM/SW Contact  Ewing Schlein, LCSW Phone Number: 12/04/2022, 2:10 PM  Clinical Narrative: CSW provided patient with bed offers with Medicare star ratings. Patient chose [facility]. CSW confirmed bed with [facility] in admissions. CSW started insurance authorization on [employer] portal, which is pending. Reference ID # is: [id number]. TOC awaiting insurance approval.   Expected Discharge Plan: Skilled Nursing Facility Barriers to Discharge: Continued Medical Work up  Expected Discharge Plan and Services In-house Referral: Clinical Social Work Post Acute Care Choice: Skilled Nursing Facility Living arrangements for the past 2 months: Apartment            DME Arranged: N/A DME Agency: NA  Social Determinants of Health (SDOH) Interventions SDOH Screenings   Food Insecurity: No Food Insecurity (12/01/2022)  Housing: Low Risk  (12/01/2022)  Transportation Needs: No Transportation Needs (12/01/2022)  Utilities: Not At Risk (12/01/2022)  Depression (PHQ2-9): Low Risk  (08/12/2018)  Social Connections: Unknown (08/02/2021)   Received from [facility]  Tobacco Use: Medium Risk (12/01/2022)   Readmission Risk Interventions    11/08/2021   10:02 AM  Readmission Risk Prevention Plan  Post Dischage Appt Complete  Medication Screening Complete  Transportation Screening Complete

## 2022-12-04 NOTE — Progress Notes (Signed)
  Progress Note   Patient: Sophia Rodriguez [id number]:[id number] DOB: Jun 12, 1950 DOA: 12/01/2022     1 DOS: the patient was seen and examined on 12/04/2022   Brief hospital course: 72 year old woman presented after mechanical fall at home.  Right hip pain.  Imaging was unrevealing.  Admitted for acute kidney injury and further evaluation of hip pain. Plan for SNF.  Consultants None  Procedures None  Assessment and Plan: Fall at home Right hip pain Etiology unclear.  Felt dizzy prior to falling, no loss of consciousness, therefore syncope ruled out.  Orthostatics were negative. CT head and neck unrevealing. Imaging of the hip was negative. Continue PT, OT.  Plan for SNF.   Acute kidney injury on chronic kidney disease stage IIIb  Baseline creatinine about 1.6. Etiology unclear but appears to be back to baseline at this point.  Renal ultrasound was unrevealing. Continue to hold ACE inhibitor and diuretic   Left foot pain  X-ray of left foot cannot rule out fracture and area of tenderness.  CT left foot no definite fracture.  On exam today the patient has no significant pain.  Symptomatic feeling better.   Volume depletion: Resolved.   Acute urinary retention Resolved   Chronic diastolic CHF: Appears compensated. Last 2D echo done on 05/08/2020 revealed LVEF 50 to 55% with grade 1 diastolic dysfunction Continue to hold diuretics and ACE inhibitor due to acute kidney injury/volume depletion.    PMH CVA Continue aspirin and statin   Chronic anxiety/depression Continue home escitalopram     Subjective:  Feels better Less foot pain today  Physical Exam: Vitals:   12/03/22 0610 12/03/22 1342 12/03/22 2117 12/04/22 0716  BP: 112/69 115/68 113/76 119/72  Pulse: 72 83 70 72  Resp: 17 16 18 18   Temp: 97.9 F (36.6 C) 98.5 F (36.9 C) 98.2 F (36.8 C) 97.8 F (36.6 C)  TempSrc: Oral Oral  Oral  SpO2: 100% 100% 95% 97%  Weight:      Height:       Physical Exam Vitals  reviewed.  Constitutional:      General: She is not in acute distress.    Appearance: She is not ill-appearing or toxic-appearing.  Cardiovascular:     Rate and Rhythm: Normal rate and regular rhythm.     Heart sounds: No murmur heard. Pulmonary:     Effort: Pulmonary effort is normal. No respiratory distress.     Breath sounds: No wheezing, rhonchi or rales.  Musculoskeletal:        General: No tenderness (left foot).  Neurological:     Mental Status: She is alert.  Psychiatric:        Mood and Affect: Mood normal.        Behavior: Behavior normal.     Data Reviewed: CT left foot no acute fracture seen  Family Communication: none  Disposition: Status is: Observation      Time spent: 20 minutes  Author: Brendia Sacks, MD 12/04/2022 6:05 PM  For on call review [email or web address]

## 2022-12-05 DIAGNOSIS — N179 Acute kidney failure, unspecified: Secondary | ICD-10-CM | POA: Diagnosis not present

## 2022-12-05 DIAGNOSIS — M25551 Pain in right hip: Secondary | ICD-10-CM | POA: Diagnosis not present

## 2022-12-05 DIAGNOSIS — W19XXXA Unspecified fall, initial encounter: Secondary | ICD-10-CM | POA: Diagnosis not present

## 2022-12-05 MED ORDER — ACETAMINOPHEN 325 MG PO TABS: 650.0000 mg | ORAL_TABLET | Freq: Four times a day (QID) | ORAL | 0 refills | Status: DC | PRN

## 2022-12-05 MED ORDER — POLYETHYLENE GLYCOL 3350 17 G PO PACK: 17.0000 g | PACK | Freq: Every day | ORAL | 0 refills | Status: DC | PRN

## 2022-12-05 NOTE — Discharge Summary (Signed)
Physician Discharge Summary  Patient ID: Sophia Rodriguez MRN: [id number] DOB/AGE: 09/06/1950 72 y.o.  Admit date: 12/01/2022 Discharge date: 12/05/2022  Admission Diagnoses:  Discharge Diagnoses:  Principal Problem:   Fall Active Problems:   Hip pain   AKI (acute kidney injury) on chronic kidney disease stage IIIb.   Left foot pain   Discharged Condition: stable  Hospital Course:  Patient is a 72 year old female with past medical history significant for CVA with right-sided weakness, diet controlled type 2 diabetes, essential hypertension, hyperlipidemia, CKD 3B, chronic diastolic CHF and chronic anxiety/depression.  Patient was admitted following a fall at home.  Patient was on the floor for about 2 hours.  No loss of consciousness.  Associated right sided hip pain.  Patient has baseline chronic kidney disease stage IIIb, with serum creatinine of 1.6.  On presentation to the hospital, serum creatinine was 2.4.  With volume repletion, serum creatinine has improved, and renal function has returned back to baseline.  PT OT consult was called.  During the hospital stay, patient developed significant left foot pain.  X-ray and CT scan of the left foot have not revealed any fractures.  Left foot pain has improved.  Patient will be discharged to skilled nursing facility.  Patient will follow-up with primary care provider and orthopedic team on discharge (i.e. if the left foot pain persists).    Fall at home Right hip pain Etiology unclear.   No loss of consciousness.   Orthostatics were negative.  CT head and neck unrevealing. Imaging of the hip was negative. Continue PT, OT.  Plan for SNF.   Acute kidney injury on chronic kidney disease stage IIIb  Baseline creatinine about 1.6. Etiology of acute kidney injury is likely prerenal.  Patient was volume depleted on presentation.   Renal function is back to baseline.  Will resume lisinopril on discharge.     Left foot pain  X-ray of left foot  cannot rule out fracture and area of tenderness.  CT left foot no definite fracture.  On exam today the patient has no significant pain.  Symptomatic feeling better.   Volume depletion: Resolved.   Acute urinary retention Resolved   Chronic diastolic CHF: Appears compensated. Last 2D echo done on 05/08/2020 revealed LVEF 50 to 55% with grade 1 diastolic dysfunction Continue to hold diuretics and ACE inhibitor due to acute kidney injury/volume depletion.    History of CVA Continue aspirin and statin   Chronic anxiety/depression Continue home escitalopram     Consults: None  Significant Diagnostic Studies:  CT of the left foot without contrast revealed:   TECHNIQUE: Multidetector CT imaging of the left foot was performed according to the standard protocol. Multiplanar CT image reconstructions were also generated.   RADIATION DOSE REDUCTION: This exam was performed according to the departmental dose-optimization program which includes automated exposure control, adjustment of the mA and/or kV according to patient size and/or use of iterative reconstruction technique.   COMPARISON:  Left foot radiographs 12/02/2022   FINDINGS: Bones/Joint/Cartilage   No definite acute fracture is seen. A subtle curvilinear lucency within the medial aspect of the intermediate cuneiform appears to be of the morphology and thickness of normal nutrient foramina (coronal series 11, image 32 and axial series 14, image 70). No definite Lisfranc interval fracture is seen.   No acute fracture is visualized elsewhere within the foot.   Ligaments   Suboptimally assessed by CT.   Muscles and Tendons   Mild atrophy and fatty infiltration of the intrinsic  foot musculature. No gross tendon tear is seen.   Soft tissues   Mild dorsal and lateral midfoot and forefoot soft tissue swelling and edema.   IMPRESSION: 1. No definite acute fracture is seen, with attention to the Lisfranc  interval. 2. Mild dorsal and lateral midfoot and forefoot soft tissue swelling and edema.   Left foot-complete 3+ View revealed:    COMPARISON:  None Available.   FINDINGS: Bony demineralization. Spurring of the first metatarsal head. Achilles calcaneal spur. Widespread subcutaneous edema in the foot. No malalignment at the Lisfranc joint.   Subtle linear lucency along the proximal metaphysis of the third metatarsal is thought to likely be from osseous ridging, if the patient has point tenderness over the Lisfranc joint then CT of the foot would be recommended to exclude a fracture.   IMPRESSION: 1. Subtle linear lucency along the proximal metaphysis of the third metatarsal is thought to likely be from osseous ridging, if the patient has point tenderness over the Lisfranc joint then CT of the foot would be recommended to exclude a fracture. 2. Widespread subcutaneous edema in the foot. 3. Bony demineralization. 4. Achilles calcaneal spur.    Discharge Exam: Blood pressure 128/71, pulse 83, temperature 98.5 F (36.9 C), temperature source Oral, resp. rate 18, height 5\' 5"  (1.651 m), weight 78 kg, SpO2 96%.   Disposition: Discharge disposition: 03-Skilled Nursing Facility       Discharge Instructions     Diet - low sodium heart healthy   Complete by: As directed    Increase activity slowly   Complete by: As directed       Allergies as of 12/05/2022   No Known Allergies      Medication List     STOP taking these medications    citalopram 10 MG tablet Commonly known as: CELEXA   furosemide 20 MG tablet Commonly known as: LASIX   losartan 25 MG tablet Commonly known as: COZAAR   zolpidem 12.5 MG CR tablet Commonly known as: AMBIEN CR       TAKE these medications    acetaminophen 325 MG tablet Commonly known as: TYLENOL Take 2 tablets (650 mg total) by mouth every 6 (six) hours as needed for mild pain, fever or headache.   alendronate 70 MG  tablet Commonly known as: FOSAMAX Take 1 tablet by mouth once a week.   aspirin EC 325 MG tablet Take 325 mg by mouth daily.   atorvastatin 40 MG tablet Commonly known as: LIPITOR Take 1 tablet (40 mg total) by mouth daily at 6 PM.   cetirizine 10 MG tablet Commonly known as: ZYRTEC Take 10 mg by mouth daily as needed for allergies.   docusate sodium 100 MG capsule Commonly known as: COLACE Take 1 capsule (100 mg total) by mouth 2 (two) times daily.   escitalopram 20 MG tablet Commonly known as: LEXAPRO Take 20 mg by mouth daily.   gabapentin 100 MG capsule Commonly known as: NEURONTIN Take 100 mg by mouth 3 (three) times daily.   lisinopril 10 MG tablet Commonly known as: ZESTRIL Take 10 mg by mouth daily.   multivitamin with minerals Tabs tablet Take 1 tablet by mouth daily.   polyethylene glycol 17 g packet Commonly known as: MIRALAX / GLYCOLAX Take 17 g by mouth daily as needed for mild constipation.        Contact information for after-discharge care     Destination     [facility] .   Service: Skilled  Nursing Contact information: [address] [place] [address] [phone]                     Time spent: 35 minutes.  SignedBarnetta Chapel 12/05/2022, 11:10 AM

## 2022-12-05 NOTE — Plan of Care (Signed)
Patient discharging to [facility]. Report called to Fayette Pho, Nurse at facility. Awaiting transport via [facility]. Haydee Salter, RN 12/05/22 1:50 PM

## 2022-12-05 NOTE — Plan of Care (Signed)
Problem: Education: Goal: Knowledge of General Education information will improve Description: Including pain rating scale, medication(s)/side effects and non-pharmacologic comfort measures Outcome: Progressing   Problem: Clinical Measurements: Goal: Ability to maintain clinical measurements within normal limits will improve Outcome: Progressing   Problem: Activity: Goal: Risk for activity intolerance will decrease Outcome: Progressing   Problem: Coping: Goal: Level of anxiety will decrease Outcome: Progressing   Problem: Safety: Goal: Ability to remain free from injury will improve Outcome: Progressing   Problem: Pain Managment: Goal: General experience of comfort will improve Outcome: Progressing   Haydee Salter, RN 12/05/22 1:11 PM

## 2022-12-05 NOTE — TOC Transition Note (Signed)
Transition of Care [facility]) - CM/SW Discharge Note  Patient Details  Name: Sophia Rodriguez MRN: [id number] Date of Birth: 1950-12-25  Transition of Care [facility]) CM/SW Contact:  Ewing Schlein, LCSW Phone Number: 12/05/2022, 1:39 PM  Clinical Narrative: Discharge summary uploaded to [employer] to show medically stability for discharge to SNF. Insurance authorization now completed on portal. Reference auth ID # is: [id number]. Patient has been approved for 12/05/2022-12/09/2022. Patient will go to room 704 and the number for report is [phone]. Discharge summary, discharge orders, and SNF transfer report faxed to facility in hub. Medical necessity form done; PTAR scheduled. Discharge packet completed. Patient updated and she reported she already notified her daughter. RN updated. TOC signing off.   Final next level of care: Skilled Nursing Facility Barriers to Discharge: Barriers Resolved  Patient Goals and CMS Choice CMS Medicare.gov Compare Post Acute Care list provided to:: Patient Choice offered to / list presented to : Patient  Discharge Placement Existing PASRR number confirmed : 12/03/22          [facility] Patient to be transferred to facility by: [facility] Patient and family notified of of transfer: 12/05/22  Discharge Plan and Services Additional resources added to the After Visit Summary for   In-house Referral: Clinical Social Work Post Acute Care Choice: Skilled Nursing Facility          DME Arranged: N/A DME Agency: NA  Social Determinants of Health (SDOH) Interventions SDOH Screenings   Food Insecurity: No Food Insecurity (12/01/2022)  Housing: Low Risk  (12/01/2022)  Transportation Needs: No Transportation Needs (12/01/2022)  Utilities: Not At Risk (12/01/2022)  Depression (PHQ2-9): Low Risk  (08/12/2018)  Social Connections: Unknown (08/02/2021)   Received from [facility], [facility]  Tobacco Use: Medium Risk (12/01/2022)   Readmission Risk  Interventions    11/08/2021   10:02 AM  Readmission Risk Prevention Plan  Post Dischage Appt Complete  Medication Screening Complete  Transportation Screening Complete

## 2023-03-26 ENCOUNTER — Other Ambulatory Visit: Payer: Self-pay

## 2023-03-26 DIAGNOSIS — I739 Peripheral vascular disease, unspecified: Secondary | ICD-10-CM

## 2023-04-02 ENCOUNTER — Ambulatory Visit: Payer: Medicare Other

## 2023-04-02 ENCOUNTER — Ambulatory Visit (HOSPITAL_COMMUNITY): Payer: Medicare Other

## 2023-05-15 ENCOUNTER — Encounter: Payer: Self-pay | Admitting: Pulmonary Disease

## 2023-05-21 ENCOUNTER — Ambulatory Visit: Payer: Medicare Other

## 2023-05-21 ENCOUNTER — Encounter (HOSPITAL_COMMUNITY): Payer: Medicare Other

## 2023-05-28 ENCOUNTER — Ambulatory Visit (HOSPITAL_COMMUNITY)
Admission: RE | Admit: 2023-05-28 | Discharge: 2023-05-28 | Disposition: A | Discharge status: Home / Self Care | Payer: Medicare Other | Source: Ambulatory Visit | Attending: Vascular Surgery | Admitting: Vascular Surgery

## 2023-05-28 ENCOUNTER — Ambulatory Visit (INDEPENDENT_AMBULATORY_CARE_PROVIDER_SITE_OTHER): Payer: Medicare Other | Admitting: Physician Assistant

## 2023-05-28 VITALS — BP 112/75 | HR 83 | Temp 97.6°F | Resp 18 | Ht 65.0 in | Wt 182.9 lb

## 2023-05-28 DIAGNOSIS — I739 Peripheral vascular disease, unspecified: Secondary | ICD-10-CM

## 2023-05-28 LAB — VAS US ABI WITH/WO TBI
Left ABI: 0.83
Right ABI: 0.78

## 2023-05-28 NOTE — Progress Notes (Signed)
 HISTORY AND PHYSICAL     CC:  follow up. Requesting Provider:  Fleet Contras, MD  HPI: This is a 73 y.o. female who is here today for follow up for PAD.    Pt was seen by Dr. Karin Lieu as new pt in December 2023 and at that time, she was not having any claudication, rest pain or non healing wounds.    She has a PMH of stroke, which led to right sided hemiparesis.  She has chronic lower extremity edema since her stroke.    The pt returns today for follow up.  She states she has occasional pain in the right foot at night that wakes her up.  This does not happen all the time.  She does not have any non healing wounds.  She does continue to have BLE swelling.  She wears her compression socks and elevates her legs.  She states that the compression socks makes her left great toe hurt and taking the sock off helps.  She is compliant with asa/statin/  The pt is on a statin for cholesterol management.    The pt is on an aspirin.    Other AC:  none The pt is on ACEI for hypertension.  The pt is not on medication for diabetes. Tobacco hx:  former  Pt does not have family hx of AAA.  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 100 tablet 0   alendronate (FOSAMAX) 70 MG tablet Take 1 tablet by mouth once a week.     aspirin EC 325 MG tablet Take 325 mg by mouth daily.     atorvastatin (LIPITOR) 40 MG tablet Take 1 tablet (40 mg total) by mouth daily at 6 PM. 30 tablet 1   cetirizine (ZYRTEC) 10 MG tablet Take 10 mg by mouth daily as needed for allergies.     docusate sodium (COLACE) 100 MG capsule Take 1 capsule (100 mg total) by mouth 2 (two) times daily. 10 capsule 0   escitalopram (LEXAPRO) 20 MG tablet Take 20 mg by mouth daily.     gabapentin (NEURONTIN) 100 MG capsule Take 100 mg by mouth 3 (three) times daily.     lisinopril (ZESTRIL) 10 MG  tablet Take 10 mg by mouth daily.     Multiple Vitamin (MULTIVITAMIN WITH MINERALS) TABS Take 1 tablet by mouth daily.     polyethylene glycol (MIRALAX / GLYCOLAX) 17 g packet Take 17 g by mouth daily as needed for mild constipation. 14 each 0   No current facility-administered medications for this visit.    Family History  Problem Relation Age of Onset   Stroke Father    Stroke Brother     Social History   Socioeconomic History   Marital status: Widowed    Spouse name: Not on file   Number of children: 2   Years of education: Grad school   Highest education level: Not on file  Occupational History   Not on file  Tobacco Use   Smoking status: Former    Current packs/day: 0.00    Types: Cigarettes    Start date: 33    Quit date: 05/28/2007    Years since quitting: 16.0   Smokeless tobacco: Never  Vaping Use   Vaping status: Never Used  Substance and Sexual Activity   Alcohol use: No   Drug use: No   Sexual activity: Not on file  Other Topics Concern   Not on  file  Social History Narrative   Lives at home by herself.    Daughter come to help at home and she has a cleaning lady to help twice a week.    Caffeine: Every morning 1-1.5 cups coffee/day, and 12oz tea/day    Right-handed   Social Drivers of Corporate investment banker Strain: Not on file  Food Insecurity: No Food Insecurity (12/01/2022)   Hunger Vital Sign    Worried About Running Out of Food in the Last Year: Never true    Ran Out of Food in the Last Year: Never true  Transportation Needs: No Transportation Needs (12/01/2022)   PRAPARE - Administrator, Civil Service (Medical): No    Lack of Transportation (Non-Medical): No  Physical Activity: Not on file  Stress: Not on file  Social Connections: Unknown (08/02/2021)   Received from [facility], [facility]   Social Network    Social Network: Not on file  Intimate Partner Violence: Not At Risk (12/01/2022)   Humiliation, Afraid, Rape, and Kick questionnaire    Fear of Current or Ex-Partner: No    Emotionally Abused: No    Physically Abused: No    Sexually Abused: No     REVIEW OF SYSTEMS:   [X]  denotes positive finding, [ ]  denotes negative finding Cardiac  Comments:  Chest pain or chest pressure:    Shortness of breath upon exertion:    Short of breath when lying flat:    Irregular heart rhythm:        Vascular    Pain in calf, thigh, or hip brought on by ambulation:    Pain in feet at night that wakes you up from your sleep:     Blood clot in your veins:    Leg swelling:         Pulmonary    Oxygen at home:    Productive cough:     Wheezing:         Neurologic    Sudden weakness in arms or legs:     Sudden numbness in arms or legs:     Sudden onset of difficulty speaking or slurred speech:    Temporary loss of vision in one eye:  Problems with dizziness:         Gastrointestinal    Blood in stool:     Vomited blood:         Genitourinary    Burning when urinating:     Blood in urine:         Psychiatric    Major depression:         Hematologic    Bleeding problems:    Problems with blood clotting too easily:        Skin    Rashes or ulcers:        Constitutional    Fever or chills:      PHYSICAL EXAMINATION:  Today's Vitals   05/28/23 0942  BP: 112/75  Pulse: 83  Resp: 18  Temp: 97.6 F (36.4 C)  TempSrc: Temporal  SpO2: 96%  Weight: 182 lb 14.4 oz (83 kg)  Height: 5\' 5"  (1.651 m)   Body mass index is 30.44 kg/m.   General:  WDWN in NAD; vital signs documented above Gait: Not observed HENT: WNL, normocephalic Pulmonary: normal non-labored breathing , without wheezing Cardiac: regular HR, without carotid bruits Abdomen: soft, NT; aortic pulse is not palpable Skin: without rashes Vascular Exam/Pulses:  Right Left  Radial 2+ (normal) 2+ (normal)  DP 1+ (weak); brisk doppler flow 2+ (normal)  PT Brisk doppler flow Brisk doppler flow  Peroneal Brisk doppler flow Brisk doppler flow   Extremities: without ischemic changes, without Gangrene , without cellulitis; without open wounds; mild BLE edema Musculoskeletal: no muscle wasting or atrophy  Neurologic: A&O X 3 Psychiatric:  The pt has Normal affect.   Non-Invasive Vascular Imaging:   ABI's/TBI's on 05/28/2023: Right:  0.78/0.56 - Great toe pressure: 77 Left:  0.38/0.46 - Great toe pressure: 63   Previous ABI's/TBI's on 03/21/2022: Right:  0.95/0.59 - Great toe pressure: 59 Left:  1.08/0.76 - Great toe pressure:  76     ASSESSMENT/PLAN:: 73 y.o. female here for follow up for PAD    -pt with palpable bilateral DP pulse with left > right  and brisk doppler flow bilateral feet.  She does get occasional pain in the right foot at night but this is on occasion and not regularly.  She does not have any non healing wounds or rest pain.   -continue asa/statin -pt will f/u in one year with ABI.  She knows to call sooner if any issues before her next visit. -she does have chronic BLE edema  that is controlled with compression and elevation.  Given she has some pain in the great toe on the left with compression, discussed maybe getting some compression with the toes out to see if that helps but not compression without the foot part because it would cause foot swelling.     Doreatha Massed, [facility] [phone]  Clinic MD:   Karin Lieu

## 2023-07-29 ENCOUNTER — Ambulatory Visit: Admitting: Pulmonary Disease

## 2023-07-29 ENCOUNTER — Encounter: Payer: Self-pay | Admitting: Pulmonary Disease

## 2023-07-29 VITALS — BP 128/62 | HR 105 | Temp 98.4°F | Ht 65.0 in | Wt 184.4 lb

## 2023-07-29 DIAGNOSIS — R0609 Other forms of dyspnea: Secondary | ICD-10-CM

## 2023-07-29 MED ORDER — BREZTRI AEROSPHERE 160-9-4.8 MCG/ACT IN AERO: 2.0000 | INHALATION_SPRAY | Freq: Two times a day (BID) | RESPIRATORY_TRACT | Status: DC

## 2023-07-29 NOTE — Progress Notes (Signed)
 @Patient  ID: Sophia Rodriguez, female    DOB: 11-13-1950, 73 y.o.   MRN: [id number]  Chief Complaint  Patient presents with  . Follow-up    SOB with any strenuous activity or long distance walks.    Referring provider: Charle Congo, MD  HPI:   73 year old whom we are seeing for follow up of dyspnea on exertion and pre-operative evaluation.  DOE stable - no worse.  Returns for pre-operative evaluation. Reviewed ARISCAT model with patient and discussed results.   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  This did not inhibit ability to obtain history.  She has dyspnea on exertion for some months worse in the last 2 or 3 months.  Symptoms worse with exertion.  Worse with inclines or stairs.  Can be present on flat surface as well.  No positional changes that make things worse, when she rests or lies down symptoms are better.  No environmental or seasonal changes that make things better or worse.  She does endorse significant seasonal allergies and nasal congestion.  This has been a little bit worse with the pollen season of late.  No other alleviating or exacerbating factors.  She does note that she had been a bit less active over the last several months.  This is somewhat due to her shortness of breath also due to joint issues.  She had planned to have hip replacement recently but this was delayed given her worsening shortness of breath symptoms.  Most recent chest x-ray as I can view 12/2012 reveals clear lungs bilaterally without infiltrate or effusion.  She had a chest x-ray that can be viewed in Care Everywhere 05/2020 report reviewed which reveals "no acute cardiopulmonary disease."  TTE 04/2020 reviewed which revealed normal EF, grade 1 diastolic dysfunction, otherwise reassuring.  PMH: Hyperlipidemia, seasonal allergies, hypertension Surgical history: Hip arthroplasty on the right Family history: Father  with CVA, brother with CVA Social history: Former smoker, 10 pack-year history, quit in the 1980s per her report  Questionaires / Pulmonary Flowsheets:   ACT:      No data to display          MMRC:     No data to display          Epworth:      No data to display          Tests:   FENO:  No results found for: "NITRICOXIDE"  PFT:    Latest Ref Rng & Units 08/06/2020   11:07 AM  PFT Results  FVC-Pre L 1.78   FVC-Predicted Pre % 69   FVC-Post L 1.53   FVC-Predicted Post % 60   Pre FEV1/FVC % % 77   Post FEV1/FCV % % 75   FEV1-Pre L 1.37   FEV1-Predicted Pre % 69   FEV1-Post L 1.15   DLCO uncorrected ml/min/mmHg 9.31   DLCO UNC% % 45   DLCO corrected ml/min/mmHg 9.91   DLCO COR %Predicted % 48   DLVA Predicted % 74   TLC L 3.58   TLC % Predicted % 68   RV % Predicted % 72   Personally reviewed and interpreted as spirometry suggestive of moderate restriction versus gas trapping.  TLC confirms moderate restriction.  DLCO is severely reduced.  WALK:      No data to display          Imaging: Personally reviewed and as per EMR discussion  this note  Lab Results: Personally reviewed, no significant elevation in eosinophils, hemoglobin stable slightly low at 11.6 CBC    Component Value Date/Time   WBC 5.7 12/02/2022 0345   RBC 3.93 12/02/2022 0345   HGB 10.7 (L) 12/02/2022 0345   HCT 34.5 (L) 12/02/2022 0345   PLT 198 12/02/2022 0345   MCV 87.8 12/02/2022 0345   MCH 27.2 12/02/2022 0345   MCHC 31.0 12/02/2022 0345   RDW 13.7 12/02/2022 0345   LYMPHSABS 0.9 10/01/2021 0921   MONOABS 0.5 10/01/2021 0921   EOSABS 0.2 10/01/2021 0921   BASOSABS 0.0 10/01/2021 0921    BMET    Component Value Date/Time   NA 138 12/03/2022 1308   NA 142 06/21/2021 0839   K 4.4 12/03/2022 1308   CL 106 12/03/2022 1308   CO2 26 12/03/2022 1308   GLUCOSE 150 (H) 12/03/2022 1308   BUN 19 12/03/2022 1308   BUN 10 06/21/2021 0839   CREATININE 1.60 (H)  12/03/2022 1308   CREATININE 1.40 (H) 01/14/2022 1621   CALCIUM  9.1 12/03/2022 1308   GFRNONAA 34 (L) 12/03/2022 1308   GFRNONAA 27 (L) 04/26/2020 1014   GFRAA 31 (L) 04/26/2020 1014    BNP    Component Value Date/Time   BNP 18.8 05/20/2021 1623    ProBNP No results found for: "PROBNP"  Specialty Problems   None  No Known Allergies  Immunization History  Administered Date(s) Administered  . PFIZER(Purple Top)SARS-COV-2 Vaccination 05/07/2019, 06/01/2019, 01/03/2020    Past Medical History:  Diagnosis Date  . Anxiety   . Arthritis   . Chronic kidney disease    related to blood pressure meds- pt reports being taken off of meds   . Complication of anesthesia    slow to wake up many years ago per pt  . Depression   . Diabetes mellitus    hx of not on meds, not on meds in over a year   . Dyspnea    with exertion  . Edema    in legs per pt   . Hypertension   . Stroke (HCC)    x 3 weakness on right side     Tobacco History: Social History   Tobacco Use  Smoking Status Former  . Current packs/day: 0.00  . Types: Cigarettes  . Start date: 8  . Quit date: 05/28/2007  . Years since quitting: 16.1  Smokeless Tobacco Never   Counseling given: Not Answered   Continue to not smoke  Outpatient Encounter Medications as of 07/29/2023  Medication Sig  . acetaminophen  (TYLENOL ) 325 MG tablet Take 2 tablets (650 mg total) by mouth every 6 (six) hours as needed for mild pain, fever or headache.  . alendronate (FOSAMAX) 70 MG tablet Take 1 tablet by mouth once a week.  . aspirin  EC 325 MG tablet Take 325 mg by mouth daily.  . atorvastatin  (LIPITOR) 40 MG tablet Take 1 tablet (40 mg total) by mouth daily at 6 PM.  . cetirizine (ZYRTEC) 10 MG tablet Take 10 mg by mouth daily as needed for allergies.  . docusate sodium  (COLACE) 100 MG capsule Take 1 capsule (100 mg total) by mouth 2 (two) times daily.  . escitalopram  (LEXAPRO ) 20 MG tablet Take 20 mg by mouth daily.  .  gabapentin  (NEURONTIN ) 100 MG capsule Take 100 mg by mouth 3 (three) times daily.  . Multiple Vitamin (MULTIVITAMIN WITH MINERALS) TABS Take 1 tablet by mouth daily.  . [DISCONTINUED] lisinopril  (ZESTRIL ) 10 MG  tablet Take 10 mg by mouth daily. (Patient not taking: Reported on 05/28/2023)  . [DISCONTINUED] polyethylene glycol (MIRALAX  / GLYCOLAX ) 17 g packet Take 17 g by mouth daily as needed for mild constipation. (Patient not taking: Reported on 07/29/2023)   No facility-administered encounter medications on file as of 07/29/2023.     Review of Systems  Review of Systems  N/a Physical Exam  BP 128/62 (BP Location: Right Arm, Patient Position: Sitting, Cuff Size: Normal)   Pulse (!) 105   Temp 98.4 F (36.9 C) (Oral)   Ht 5\' 5"  (1.651 m)   Wt 184 lb 6.4 oz (83.6 kg)   SpO2 98%   BMI 30.69 kg/m   Wt Readings from Last 5 Encounters:  07/29/23 184 lb 6.4 oz (83.6 kg)  05/28/23 182 lb 14.4 oz (83 kg)  12/05/22 171 lb 15.3 oz (78 kg)  03/21/22 171 lb (77.6 kg)  11/07/21 172 lb (78 kg)    BMI Readings from Last 5 Encounters:  07/29/23 30.69 kg/m  05/28/23 30.44 kg/m  12/05/22 28.62 kg/m  03/21/22 28.46 kg/m  11/07/21 28.62 kg/m     Physical Exam General: Frail, in no distress Eyes: EOMI, no icterus Neck: Supple no JVP Cardiovascular: Regular rhythm, no murmur Pulmonary: Clear to auscultation bilaterally, normal work of breathing Abdomen: Nondistended, bowel sounds present  MSK: No synovitis, joint  neuro: Normal gait, no weakness Psych: Normal mood, flat affect   Assessment & Plan:   DOE: Suspect multifactorial but concern for deconditioning playing a major role.  In setting of MSK issues and difficulty ambulating.  TTE relatively reassuring in the past, 2022.  PFTs with restriction and reduced DLCO.  No ILD on high-res CT scan, emphysema present.  Suspect emphysema contributing.  Did not respond well to Advair.  Escalated to Trelegy 7/22 without response.  Trialed  as needed bronchodilators fall 2022 without improvement.  Worse after hospitalization 11/2022 after fall and hip fracture.  Likely deconditioning worsening.  Encourage activity.  Repeat PFTs, CTA PE protocol to evaluate occult PE, now would be chronic following fall and relative immobility.  Retry inhalers, Breztri via samples today.  Emphysema: Related to history of cigarette smoking.  Contributor symptoms.  Not responding to inhalers.  Suspect deconditioning major issue. Re-evaluate as above.   Return in about 8 weeks (around 09/23/2023) for f/u Dr. Marygrace Snellen.   Guerry Leek, MD 07/29/2023   I spent 40 minutes in the care of the patient today including reviewing records, face-to-face visit, coordination of care.

## 2023-07-29 NOTE — Patient Instructions (Signed)
 Use Breztri 2 puffs twice a day every day, rinse your mouth out with water after use  Lets get pulmonary function test to further evaluate, these were normal in 2022.  Since everything is worse after the fall and fracture, I want a make sure there is no old blood clot in the lungs.  I have ordered a CT scan with contrast to evaluate this.  Someone should call and schedule it.  Return to clinic in 6 to 8 weeks after CT scan and PFTs

## 2023-08-13 ENCOUNTER — Ambulatory Visit
Admission: RE | Admit: 2023-08-13 | Discharge: 2023-08-13 | Disposition: A | Discharge status: Home / Self Care | Source: Ambulatory Visit | Attending: Pulmonary Disease | Admitting: Pulmonary Disease

## 2023-08-13 DIAGNOSIS — R0609 Other forms of dyspnea: Secondary | ICD-10-CM

## 2023-08-13 MED ORDER — IOPAMIDOL (ISOVUE-370) INJECTION 76%
70.0000 mL | Freq: Once | INTRAVENOUS | Status: AC | PRN
  Administered 2023-08-13: 70 mL via INTRAVENOUS

## 2023-09-14 ENCOUNTER — Telehealth: Payer: Self-pay | Admitting: Pulmonary Disease

## 2023-09-14 NOTE — Telephone Encounter (Signed)
 Fax received from Dr. Grissom with [facility] to perform a cataract extraction left and right under IV sedation on patient.  Patient needs surgery clearance. Surgery is pending clearance. Patient was seen on 07/29/23. Office protocol is a risk assessment can be sent to surgeon if patient has been seen in 60 days or less.   She has appt with Dr. Annella for further eval including full PFT 10/14/23. Will hold in clearance pool until this visit is complete.

## 2023-09-18 ENCOUNTER — Other Ambulatory Visit: Payer: Self-pay | Admitting: Otolaryngology

## 2023-09-18 DIAGNOSIS — E042 Nontoxic multinodular goiter: Secondary | ICD-10-CM

## 2023-09-21 ENCOUNTER — Ambulatory Visit
Admission: RE | Admit: 2023-09-21 | Discharge: 2023-09-21 | Disposition: A | Discharge status: Home / Self Care | Source: Ambulatory Visit | Attending: Otolaryngology | Admitting: Otolaryngology

## 2023-09-21 DIAGNOSIS — E042 Nontoxic multinodular goiter: Secondary | ICD-10-CM

## 2023-10-14 ENCOUNTER — Ambulatory Visit: Admitting: Pulmonary Disease

## 2023-10-14 ENCOUNTER — Encounter: Payer: Self-pay | Admitting: Pulmonary Disease

## 2023-10-14 VITALS — BP 104/62 | HR 121 | Temp 97.8°F | Ht 65.0 in | Wt 186.6 lb

## 2023-10-14 DIAGNOSIS — N289 Disorder of kidney and ureter, unspecified: Secondary | ICD-10-CM | POA: Diagnosis not present

## 2023-10-14 DIAGNOSIS — R0609 Other forms of dyspnea: Secondary | ICD-10-CM | POA: Diagnosis not present

## 2023-10-14 LAB — PULMONARY FUNCTION TEST
DL/VA % pred: 66 %
DL/VA: 2.72 ml/min/mmHg/L
DLCO unc % pred: 44 %
DLCO unc: 8.85 ml/min/mmHg
FEF 25-75 Post: 1.66 L/s
FEF 25-75 Pre: 1.79 L/s
FEF2575-%Change-Post: -7 %
FEF2575-%Pred-Post: 86 %
FEF2575-%Pred-Pre: 93 %
FEV1-%Change-Post: 0 %
FEV1-%Pred-Post: 74 %
FEV1-%Pred-Pre: 75 %
FEV1-Post: 1.74 L
FEV1-Pre: 1.75 L
FEV1FVC-%Change-Post: -6 %
FEV1FVC-%Pred-Pre: 104 %
FEV6-%Change-Post: 7 %
FEV6-%Pred-Post: 79 %
FEV6-%Pred-Pre: 74 %
FEV6-Post: 2.35 L
FEV6-Pre: 2.19 L
FEV6FVC-%Change-Post: 0 %
FEV6FVC-%Pred-Post: 103 %
FEV6FVC-%Pred-Pre: 104 %
FVC-%Change-Post: 6 %
FVC-%Pred-Post: 76 %
FVC-%Pred-Pre: 71 %
FVC-Post: 2.37 L
FVC-Pre: 2.22 L
Post FEV1/FVC ratio: 74 %
Post FEV6/FVC ratio: 99 %
Pre FEV1/FVC ratio: 79 %
Pre FEV6/FVC Ratio: 100 %
RV % pred: 85 %
RV: 1.94 L
TLC % pred: 78 %
TLC: 4.06 L

## 2023-10-14 NOTE — H&P (View-Only) (Signed)
 @Patient  ID: Sophia Rodriguez, female    DOB: Apr 02, 1950, 73 y.o.   MRN: [id number]  No chief complaint on file.   Referring provider: Annella Donnice SAUNDERS, MD  HPI:   73 y.o. whom we are seeing for follow up of dyspnea on exertion and pre-operative evaluation.  DOE stable - no worse.  Reviewed CT scan no worsening ILD, no occult chronic PE.  Thyroid  ultrasound already performed for thyroid  nodule, has been followed in the past.  Kidney lesion seen in the past as well.  Unclear if its changed or not.  MRI is recommended by radiology.  Was not recommended in 2022 last seen.  We discussed obtaining MRI she is okay with this.  We discussed PFTs performed today, these are improved from prior, mild restriction, severely reduced DLCO.  May be pulmonary hypertension given clear parenchymal findings.  Prior TTE was okay.  Discussed right heart catheterization and she is okay with moving forth this for further evaluation of her shortness of breath.   Returns for pre-operative evaluation. Reviewed ARISCAT model with patient and discussed results.   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  This did not inhibit ability to obtain history.  She has dyspnea on exertion for some months worse in the last 2 or 3 months.  Symptoms worse with exertion.  Worse with inclines or stairs.  Can be present on flat surface as well.  No positional changes that make things worse, when she rests or lies down symptoms are better.  No environmental or seasonal changes that make things better or worse.  She does endorse significant seasonal allergies and nasal congestion.  This has been a little bit worse with the pollen season of late.  No other alleviating or exacerbating factors.  She does note that she had been a bit less active over the last several months.  This is somewhat due to her shortness of breath also due to joint issues.  She had  planned to have hip replacement recently but this was delayed given her worsening shortness of breath symptoms.  Most recent chest x-ray as I can view 12/2012 reveals clear lungs bilaterally without infiltrate or effusion.  She had a chest x-ray that can be viewed in Care Everywhere 05/2020 report reviewed which reveals no acute cardiopulmonary disease.  TTE 04/2020 reviewed which revealed normal EF, grade 1 diastolic dysfunction, otherwise reassuring.  PMH: Hyperlipidemia, seasonal allergies, hypertension Surgical history: Hip arthroplasty on the right Family history: Father with CVA, brother with CVA Social history: Former smoker, 10 pack-year history, quit in the 1980s per her report  Questionaires / Pulmonary Flowsheets:   ACT:      No data to display          MMRC:     No data to display          Epworth:      No data to display          Tests:   FENO:  No results found for: NITRICOXIDE  PFT:    Latest Ref Rng & Units 10/14/2023    1:45 PM 08/06/2020   11:07 AM  PFT Results  FVC-Pre L 2.22  P 1.78   FVC-Predicted Pre % 71  P 69   FVC-Post L 2.37  P 1.53   FVC-Predicted Post % 76  P 60   Pre FEV1/FVC % % 79  P 77   Post  FEV1/FCV % % 74  P 75   FEV1-Pre L 1.75  P 1.37   FEV1-Predicted Pre % 75  P 69   FEV1-Post L 1.74  P 1.15   DLCO uncorrected ml/min/mmHg 8.85  P 9.31   DLCO UNC% % 44  P 45   DLCO corrected ml/min/mmHg  9.91   DLCO COR %Predicted %  48   DLVA Predicted % 66  P 74   TLC L 4.06  P 3.58   TLC % Predicted % 78  P 68   RV % Predicted % 85  P 72     P Preliminary result  Personally reviewed and interpreted as spirometry suggestive of moderate restriction versus gas trapping.  TLC confirms mild restriction.  DLCO is severely reduced.  Compared to 3 years prior, all values are improved excluding DLCO, that is stable.  WALK:      No data to display          Imaging: Personally reviewed and as per EMR discussion this note  Lab  Results: Personally reviewed, no significant elevation in eosinophils, hemoglobin stable slightly low at 11.6 CBC    Component Value Date/Time   WBC 5.7 12/02/2022 0345   RBC 3.93 12/02/2022 0345   HGB 10.7 (L) 12/02/2022 0345   HCT 34.5 (L) 12/02/2022 0345   PLT 198 12/02/2022 0345   MCV 87.8 12/02/2022 0345   MCH 27.2 12/02/2022 0345   MCHC 31.0 12/02/2022 0345   RDW 13.7 12/02/2022 0345   LYMPHSABS 0.9 10/01/2021 0921   MONOABS 0.5 10/01/2021 0921   EOSABS 0.2 10/01/2021 0921   BASOSABS 0.0 10/01/2021 0921    BMET    Component Value Date/Time   NA 138 12/03/2022 1308   NA 142 06/21/2021 0839   K 4.4 12/03/2022 1308   CL 106 12/03/2022 1308   CO2 26 12/03/2022 1308   GLUCOSE 150 (H) 12/03/2022 1308   BUN 19 12/03/2022 1308   BUN 10 06/21/2021 0839   CREATININE 1.60 (H) 12/03/2022 1308   CREATININE 1.40 (H) 01/14/2022 1621   CALCIUM  9.1 12/03/2022 1308   GFRNONAA 34 (L) 12/03/2022 1308   GFRNONAA 27 (L) 04/26/2020 1014   GFRAA 31 (L) 04/26/2020 1014    BNP    Component Value Date/Time   BNP 18.8 05/20/2021 1623    ProBNP No results found for: PROBNP  Specialty Problems   None  No Known Allergies  Immunization History  Administered Date(s) Administered   PFIZER(Purple Top)SARS-COV-2 Vaccination 05/07/2019, 06/01/2019, 01/03/2020    Past Medical History:  Diagnosis Date   Anxiety    Arthritis    Chronic kidney disease    related to blood pressure meds- pt reports being taken off of meds    Complication of anesthesia    slow to wake up many years ago per pt   Depression    Diabetes mellitus    hx of not on meds, not on meds in over a year    Dyspnea    with exertion   Edema    in legs per pt    Hypertension    Stroke (HCC)    x 3 weakness on right side     Tobacco History: Social History   Tobacco Use  Smoking Status Former   Current packs/day: 0.00   Types: Cigarettes   Start date: 57   Quit date: 05/28/2007   Years since  quitting: 16.3  Smokeless Tobacco Never   Counseling given: Not Answered  Continue to not smoke  Outpatient Encounter Medications as of 10/14/2023  Medication Sig   acetaminophen  (TYLENOL ) 325 MG tablet Take 2 tablets (650 mg total) by mouth every 6 (six) hours as needed for mild pain, fever or headache.   alendronate (FOSAMAX) 70 MG tablet Take 1 tablet by mouth once a week.   aspirin  EC 325 MG tablet Take 325 mg by mouth daily.   atorvastatin  (LIPITOR) 40 MG tablet Take 1 tablet (40 mg total) by mouth daily at 6 PM.   budeson-glycopyrrolate -formoterol (BREZTRI  AEROSPHERE) 160-9-4.8 MCG/ACT AERO inhaler Inhale 2 puffs into the lungs in the morning and at bedtime.   cetirizine (ZYRTEC) 10 MG tablet Take 10 mg by mouth daily as needed for allergies.   docusate sodium  (COLACE) 100 MG capsule Take 1 capsule (100 mg total) by mouth 2 (two) times daily.   escitalopram  (LEXAPRO ) 20 MG tablet Take 20 mg by mouth daily.   gabapentin  (NEURONTIN ) 100 MG capsule Take 100 mg by mouth 3 (three) times daily.   Multiple Vitamin (MULTIVITAMIN WITH MINERALS) TABS Take 1 tablet by mouth daily.   No facility-administered encounter medications on file as of 10/14/2023.     Review of Systems  Review of Systems  N/a Physical Exam  BP 104/62   Pulse (!) 121   Temp 97.8 F (36.6 C) (Temporal)   Ht 5' 5 (1.651 m)   Wt 186 lb 9.6 oz (84.6 kg)   SpO2 98%   BMI 31.05 kg/m   Wt Readings from Last 5 Encounters:  10/14/23 186 lb 9.6 oz (84.6 kg)  07/29/23 184 lb 6.4 oz (83.6 kg)  05/28/23 182 lb 14.4 oz (83 kg)  12/05/22 171 lb 15.3 oz (78 kg)  03/21/22 171 lb (77.6 kg)    BMI Readings from Last 5 Encounters:  10/14/23 31.05 kg/m  07/29/23 30.69 kg/m  05/28/23 30.44 kg/m  12/05/22 28.62 kg/m  03/21/22 28.46 kg/m     Physical Exam General: Frail, in no distress Eyes: EOMI, no icterus Neck: Supple no JVP Cardiovascular: Regular rhythm, no murmur Pulmonary: Clear to auscultation  bilaterally, normal work of breathing Abdomen: Nondistended, bowel sounds present  MSK: No synovitis, joint  neuro: Normal gait, no weakness Psych: Normal mood, flat affect   Assessment & Plan:   DOE: Suspect multifactorial but concern for deconditioning playing a major role.  In setting of MSK issues and difficulty ambulating.  TTE relatively reassuring in the past, 2022.  PFTs with restriction and reduced DLCO.  No ILD on high-res CT scan, emphysema present.  Suspect emphysema contributing.  Did not respond well to Advair.  Escalated to Trelegy 7/22 without response.  Trialed as needed bronchodilators fall 2022 without improvement.  Worse after hospitalization 11/2022 after fall and hip fracture.  Likely deconditioning worsening.  Encourage activity.  Repeat PFTs improved, CTA without any worsening parenchymal findings and no occult PE acute or chronic.  No improvement with Breztri .  Okay to stop this.  Given DLCO out of proportion to PFTs (although could argue related to emphysema) on serial testing, will pursue right heart catheterization, cardiologist messaged.  Emphysema: Related to history of cigarette smoking.  Contributor symptoms.  Not responding to inhalers.  Suspect deconditioning major issue. Re-evaluate as above.  Preoperative evaluation: Pulmonary medicine not provide preoperative clearance or other preoperative risk assessment.  Based on the ARISCAT model patient is low at 1.6% risk of postoperative pulmonary complication assuming duration of surgery is less than 3 hours.  If duration of surgery  is greater than 3 hours then patient is intermediate to 13.3% risk of postoperative pulmonary complication.  As this is an eye surgery I suspect this will be a very short procedure.   Return in about 2 months (around 12/15/2023) for f/u Dr. Annella.   Donnice JONELLE Annella, MD 10/14/2023

## 2023-10-14 NOTE — Patient Instructions (Signed)
 Full pft performed today.

## 2023-10-14 NOTE — Progress Notes (Signed)
 @Patient  ID: Sophia Rodriguez, female    DOB: Apr 02, 1950, 73 y.o.   MRN: [id number]  No chief complaint on file.   Referring provider: Annella Donnice SAUNDERS, MD  HPI:   73 y.o. whom we are seeing for follow up of dyspnea on exertion and pre-operative evaluation.  DOE stable - no worse.  Reviewed CT scan no worsening ILD, no occult chronic PE.  Thyroid  ultrasound already performed for thyroid  nodule, has been followed in the past.  Kidney lesion seen in the past as well.  Unclear if its changed or not.  MRI is recommended by radiology.  Was not recommended in 2022 last seen.  We discussed obtaining MRI she is okay with this.  We discussed PFTs performed today, these are improved from prior, mild restriction, severely reduced DLCO.  May be pulmonary hypertension given clear parenchymal findings.  Prior TTE was okay.  Discussed right heart catheterization and she is okay with moving forth this for further evaluation of her shortness of breath.   Returns for pre-operative evaluation. Reviewed ARISCAT model with patient and discussed results.   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  This did not inhibit ability to obtain history.  She has dyspnea on exertion for some months worse in the last 2 or 3 months.  Symptoms worse with exertion.  Worse with inclines or stairs.  Can be present on flat surface as well.  No positional changes that make things worse, when she rests or lies down symptoms are better.  No environmental or seasonal changes that make things better or worse.  She does endorse significant seasonal allergies and nasal congestion.  This has been a little bit worse with the pollen season of late.  No other alleviating or exacerbating factors.  She does note that she had been a bit less active over the last several months.  This is somewhat due to her shortness of breath also due to joint issues.  She had  planned to have hip replacement recently but this was delayed given her worsening shortness of breath symptoms.  Most recent chest x-ray as I can view 12/2012 reveals clear lungs bilaterally without infiltrate or effusion.  She had a chest x-ray that can be viewed in Care Everywhere 05/2020 report reviewed which reveals no acute cardiopulmonary disease.  TTE 04/2020 reviewed which revealed normal EF, grade 1 diastolic dysfunction, otherwise reassuring.  PMH: Hyperlipidemia, seasonal allergies, hypertension Surgical history: Hip arthroplasty on the right Family history: Father with CVA, brother with CVA Social history: Former smoker, 10 pack-year history, quit in the 1980s per her report  Questionaires / Pulmonary Flowsheets:   ACT:      No data to display          MMRC:     No data to display          Epworth:      No data to display          Tests:   FENO:  No results found for: NITRICOXIDE  PFT:    Latest Ref Rng & Units 10/14/2023    1:45 PM 08/06/2020   11:07 AM  PFT Results  FVC-Pre L 2.22  P 1.78   FVC-Predicted Pre % 71  P 69   FVC-Post L 2.37  P 1.53   FVC-Predicted Post % 76  P 60   Pre FEV1/FVC % % 79  P 77   Post  FEV1/FCV % % 74  P 75   FEV1-Pre L 1.75  P 1.37   FEV1-Predicted Pre % 75  P 69   FEV1-Post L 1.74  P 1.15   DLCO uncorrected ml/min/mmHg 8.85  P 9.31   DLCO UNC% % 44  P 45   DLCO corrected ml/min/mmHg  9.91   DLCO COR %Predicted %  48   DLVA Predicted % 66  P 74   TLC L 4.06  P 3.58   TLC % Predicted % 78  P 68   RV % Predicted % 85  P 72     P Preliminary result  Personally reviewed and interpreted as spirometry suggestive of moderate restriction versus gas trapping.  TLC confirms mild restriction.  DLCO is severely reduced.  Compared to 3 years prior, all values are improved excluding DLCO, that is stable.  WALK:      No data to display          Imaging: Personally reviewed and as per EMR discussion this note  Lab  Results: Personally reviewed, no significant elevation in eosinophils, hemoglobin stable slightly low at 11.6 CBC    Component Value Date/Time   WBC 5.7 12/02/2022 0345   RBC 3.93 12/02/2022 0345   HGB 10.7 (L) 12/02/2022 0345   HCT 34.5 (L) 12/02/2022 0345   PLT 198 12/02/2022 0345   MCV 87.8 12/02/2022 0345   MCH 27.2 12/02/2022 0345   MCHC 31.0 12/02/2022 0345   RDW 13.7 12/02/2022 0345   LYMPHSABS 0.9 10/01/2021 0921   MONOABS 0.5 10/01/2021 0921   EOSABS 0.2 10/01/2021 0921   BASOSABS 0.0 10/01/2021 0921    BMET    Component Value Date/Time   NA 138 12/03/2022 1308   NA 142 06/21/2021 0839   K 4.4 12/03/2022 1308   CL 106 12/03/2022 1308   CO2 26 12/03/2022 1308   GLUCOSE 150 (H) 12/03/2022 1308   BUN 19 12/03/2022 1308   BUN 10 06/21/2021 0839   CREATININE 1.60 (H) 12/03/2022 1308   CREATININE 1.40 (H) 01/14/2022 1621   CALCIUM  9.1 12/03/2022 1308   GFRNONAA 34 (L) 12/03/2022 1308   GFRNONAA 27 (L) 04/26/2020 1014   GFRAA 31 (L) 04/26/2020 1014    BNP    Component Value Date/Time   BNP 18.8 05/20/2021 1623    ProBNP No results found for: PROBNP  Specialty Problems   None  No Known Allergies  Immunization History  Administered Date(s) Administered   PFIZER(Purple Top)SARS-COV-2 Vaccination 05/07/2019, 06/01/2019, 01/03/2020    Past Medical History:  Diagnosis Date   Anxiety    Arthritis    Chronic kidney disease    related to blood pressure meds- pt reports being taken off of meds    Complication of anesthesia    slow to wake up many years ago per pt   Depression    Diabetes mellitus    hx of not on meds, not on meds in over a year    Dyspnea    with exertion   Edema    in legs per pt    Hypertension    Stroke (HCC)    x 3 weakness on right side     Tobacco History: Social History   Tobacco Use  Smoking Status Former   Current packs/day: 0.00   Types: Cigarettes   Start date: 57   Quit date: 05/28/2007   Years since  quitting: 16.3  Smokeless Tobacco Never   Counseling given: Not Answered  Continue to not smoke  Outpatient Encounter Medications as of 10/14/2023  Medication Sig   acetaminophen  (TYLENOL ) 325 MG tablet Take 2 tablets (650 mg total) by mouth every 6 (six) hours as needed for mild pain, fever or headache.   alendronate (FOSAMAX) 70 MG tablet Take 1 tablet by mouth once a week.   aspirin  EC 325 MG tablet Take 325 mg by mouth daily.   atorvastatin  (LIPITOR) 40 MG tablet Take 1 tablet (40 mg total) by mouth daily at 6 PM.   budeson-glycopyrrolate -formoterol (BREZTRI  AEROSPHERE) 160-9-4.8 MCG/ACT AERO inhaler Inhale 2 puffs into the lungs in the morning and at bedtime.   cetirizine (ZYRTEC) 10 MG tablet Take 10 mg by mouth daily as needed for allergies.   docusate sodium  (COLACE) 100 MG capsule Take 1 capsule (100 mg total) by mouth 2 (two) times daily.   escitalopram  (LEXAPRO ) 20 MG tablet Take 20 mg by mouth daily.   gabapentin  (NEURONTIN ) 100 MG capsule Take 100 mg by mouth 3 (three) times daily.   Multiple Vitamin (MULTIVITAMIN WITH MINERALS) TABS Take 1 tablet by mouth daily.   No facility-administered encounter medications on file as of 10/14/2023.     Review of Systems  Review of Systems  N/a Physical Exam  BP 104/62   Pulse (!) 121   Temp 97.8 F (36.6 C) (Temporal)   Ht 5' 5 (1.651 m)   Wt 186 lb 9.6 oz (84.6 kg)   SpO2 98%   BMI 31.05 kg/m   Wt Readings from Last 5 Encounters:  10/14/23 186 lb 9.6 oz (84.6 kg)  07/29/23 184 lb 6.4 oz (83.6 kg)  05/28/23 182 lb 14.4 oz (83 kg)  12/05/22 171 lb 15.3 oz (78 kg)  03/21/22 171 lb (77.6 kg)    BMI Readings from Last 5 Encounters:  10/14/23 31.05 kg/m  07/29/23 30.69 kg/m  05/28/23 30.44 kg/m  12/05/22 28.62 kg/m  03/21/22 28.46 kg/m     Physical Exam General: Frail, in no distress Eyes: EOMI, no icterus Neck: Supple no JVP Cardiovascular: Regular rhythm, no murmur Pulmonary: Clear to auscultation  bilaterally, normal work of breathing Abdomen: Nondistended, bowel sounds present  MSK: No synovitis, joint  neuro: Normal gait, no weakness Psych: Normal mood, flat affect   Assessment & Plan:   DOE: Suspect multifactorial but concern for deconditioning playing a major role.  In setting of MSK issues and difficulty ambulating.  TTE relatively reassuring in the past, 2022.  PFTs with restriction and reduced DLCO.  No ILD on high-res CT scan, emphysema present.  Suspect emphysema contributing.  Did not respond well to Advair.  Escalated to Trelegy 7/22 without response.  Trialed as needed bronchodilators fall 2022 without improvement.  Worse after hospitalization 11/2022 after fall and hip fracture.  Likely deconditioning worsening.  Encourage activity.  Repeat PFTs improved, CTA without any worsening parenchymal findings and no occult PE acute or chronic.  No improvement with Breztri .  Okay to stop this.  Given DLCO out of proportion to PFTs (although could argue related to emphysema) on serial testing, will pursue right heart catheterization, cardiologist messaged.  Emphysema: Related to history of cigarette smoking.  Contributor symptoms.  Not responding to inhalers.  Suspect deconditioning major issue. Re-evaluate as above.  Preoperative evaluation: Pulmonary medicine not provide preoperative clearance or other preoperative risk assessment.  Based on the ARISCAT model patient is low at 1.6% risk of postoperative pulmonary complication assuming duration of surgery is less than 3 hours.  If duration of surgery  is greater than 3 hours then patient is intermediate to 13.3% risk of postoperative pulmonary complication.  As this is an eye surgery I suspect this will be a very short procedure.   Return in about 2 months (around 12/15/2023) for f/u Dr. Annella.   Donnice JONELLE Annella, MD 10/14/2023

## 2023-10-14 NOTE — Progress Notes (Signed)
 @Patient  ID: Sophia Rodriguez, female    DOB: 03/21/1951, 73 y.o.   MRN: [id number]  No chief complaint on file.   Referring provider: Annella Donnice SAUNDERS, MD  HPI:   73 year old whom we are seeing for follow up of dyspnea on exertion and pre-operative evaluation.  DOE stable - no worse.  Returns for pre-operative evaluation. Reviewed ARISCAT model with patient and discussed results.   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  This did not inhibit ability to obtain history.  She has dyspnea on exertion for some months worse in the last 2 or 3 months.  Symptoms worse with exertion.  Worse with inclines or stairs.  Can be present on flat surface as well.  No positional changes that make things worse, when she rests or lies down symptoms are better.  No environmental or seasonal changes that make things better or worse.  She does endorse significant seasonal allergies and nasal congestion.  This has been a little bit worse with the pollen season of late.  No other alleviating or exacerbating factors.  She does note that she had been a bit less active over the last several months.  This is somewhat due to her shortness of breath also due to joint issues.  She had planned to have hip replacement recently but this was delayed given her worsening shortness of breath symptoms.  Most recent chest x-ray as I can view 12/2012 reveals clear lungs bilaterally without infiltrate or effusion.  She had a chest x-ray that can be viewed in Care Everywhere 05/2020 report reviewed which reveals no acute cardiopulmonary disease.  TTE 04/2020 reviewed which revealed normal EF, grade 1 diastolic dysfunction, otherwise reassuring.  PMH: Hyperlipidemia, seasonal allergies, hypertension Surgical history: Hip arthroplasty on the right Family history: Father with CVA, brother with CVA Social history: Former smoker, 10 pack-year history,  quit in the 1980s per her report  Questionaires / Pulmonary Flowsheets:   ACT:      No data to display          MMRC:     No data to display          Epworth:      No data to display          Tests:   FENO:  No results found for: NITRICOXIDE  PFT:    Latest Ref Rng & Units 10/14/2023    1:45 PM 08/06/2020   11:07 AM  PFT Results  FVC-Pre L 2.22  P 1.78   FVC-Predicted Pre % 71  P 69   FVC-Post L 2.37  P 1.53   FVC-Predicted Post % 76  P 60   Pre FEV1/FVC % % 79  P 77   Post FEV1/FCV % % 74  P 75   FEV1-Pre L 1.75  P 1.37   FEV1-Predicted Pre % 75  P 69   FEV1-Post L 1.74  P 1.15   DLCO uncorrected ml/min/mmHg 8.85  P 9.31   DLCO UNC% % 44  P 45   DLCO corrected ml/min/mmHg  9.91   DLCO COR %Predicted %  48   DLVA Predicted % 66  P 74   TLC L 4.06  P 3.58   TLC % Predicted % 78  P 68   RV % Predicted % 85  P 72     P Preliminary result  Personally reviewed and interpreted as spirometry suggestive of  moderate restriction versus gas trapping.  TLC confirms moderate restriction.  DLCO is severely reduced.  WALK:      No data to display          Imaging: Personally reviewed and as per EMR discussion this note  Lab Results: Personally reviewed, no significant elevation in eosinophils, hemoglobin stable slightly low at 11.6 CBC    Component Value Date/Time   WBC 5.7 12/02/2022 0345   RBC 3.93 12/02/2022 0345   HGB 10.7 (L) 12/02/2022 0345   HCT 34.5 (L) 12/02/2022 0345   PLT 198 12/02/2022 0345   MCV 87.8 12/02/2022 0345   MCH 27.2 12/02/2022 0345   MCHC 31.0 12/02/2022 0345   RDW 13.7 12/02/2022 0345   LYMPHSABS 0.9 10/01/2021 0921   MONOABS 0.5 10/01/2021 0921   EOSABS 0.2 10/01/2021 0921   BASOSABS 0.0 10/01/2021 0921    BMET    Component Value Date/Time   NA 138 12/03/2022 1308   NA 142 06/21/2021 0839   K 4.4 12/03/2022 1308   CL 106 12/03/2022 1308   CO2 26 12/03/2022 1308   GLUCOSE 150 (H) 12/03/2022 1308   BUN 19  12/03/2022 1308   BUN 10 06/21/2021 0839   CREATININE 1.60 (H) 12/03/2022 1308   CREATININE 1.40 (H) 01/14/2022 1621   CALCIUM  9.1 12/03/2022 1308   GFRNONAA 34 (L) 12/03/2022 1308   GFRNONAA 27 (L) 04/26/2020 1014   GFRAA 31 (L) 04/26/2020 1014    BNP    Component Value Date/Time   BNP 18.8 05/20/2021 1623    ProBNP No results found for: PROBNP  Specialty Problems   None  No Known Allergies  Immunization History  Administered Date(s) Administered  . PFIZER(Purple Top)SARS-COV-2 Vaccination 05/07/2019, 06/01/2019, 01/03/2020    Past Medical History:  Diagnosis Date  . Anxiety   . Arthritis   . Chronic kidney disease    related to blood pressure meds- pt reports being taken off of meds   . Complication of anesthesia    slow to wake up many years ago per pt  . Depression   . Diabetes mellitus    hx of not on meds, not on meds in over a year   . Dyspnea    with exertion  . Edema    in legs per pt   . Hypertension   . Stroke (HCC)    x 3 weakness on right side     Tobacco History: Social History   Tobacco Use  Smoking Status Former  . Current packs/day: 0.00  . Types: Cigarettes  . Start date: 43  . Quit date: 05/28/2007  . Years since quitting: 16.3  Smokeless Tobacco Never   Counseling given: Not Answered   Continue to not smoke  Outpatient Encounter Medications as of 10/14/2023  Medication Sig  . acetaminophen  (TYLENOL ) 325 MG tablet Take 2 tablets (650 mg total) by mouth every 6 (six) hours as needed for mild pain, fever or headache.  . alendronate (FOSAMAX) 70 MG tablet Take 1 tablet by mouth once a week.  . aspirin  EC 325 MG tablet Take 325 mg by mouth daily.  . atorvastatin  (LIPITOR) 40 MG tablet Take 1 tablet (40 mg total) by mouth daily at 6 PM.  . budeson-glycopyrrolate -formoterol (BREZTRI  AEROSPHERE) 160-9-4.8 MCG/ACT AERO inhaler Inhale 2 puffs into the lungs in the morning and at bedtime.  . cetirizine (ZYRTEC) 10 MG tablet Take 10 mg  by mouth daily as needed for allergies.  . docusate sodium  (COLACE) 100  MG capsule Take 1 capsule (100 mg total) by mouth 2 (two) times daily.  . escitalopram  (LEXAPRO ) 20 MG tablet Take 20 mg by mouth daily.  . gabapentin  (NEURONTIN ) 100 MG capsule Take 100 mg by mouth 3 (three) times daily.  . Multiple Vitamin (MULTIVITAMIN WITH MINERALS) TABS Take 1 tablet by mouth daily.   No facility-administered encounter medications on file as of 10/14/2023.     Review of Systems  Review of Systems  N/a Physical Exam  BP 104/62   Pulse (!) 121   Temp 97.8 F (36.6 C) (Temporal)   Ht 5' 5 (1.651 m)   Wt 186 lb 9.6 oz (84.6 kg)   SpO2 98%   BMI 31.05 kg/m   Wt Readings from Last 5 Encounters:  10/14/23 186 lb 9.6 oz (84.6 kg)  07/29/23 184 lb 6.4 oz (83.6 kg)  05/28/23 182 lb 14.4 oz (83 kg)  12/05/22 171 lb 15.3 oz (78 kg)  03/21/22 171 lb (77.6 kg)    BMI Readings from Last 5 Encounters:  10/14/23 31.05 kg/m  07/29/23 30.69 kg/m  05/28/23 30.44 kg/m  12/05/22 28.62 kg/m  03/21/22 28.46 kg/m     Physical Exam General: Frail, in no distress Eyes: EOMI, no icterus Neck: Supple no JVP Cardiovascular: Regular rhythm, no murmur Pulmonary: Clear to auscultation bilaterally, normal work of breathing Abdomen: Nondistended, bowel sounds present  MSK: No synovitis, joint  neuro: Normal gait, no weakness Psych: Normal mood, flat affect   Assessment & Plan:   DOE: Suspect multifactorial but concern for deconditioning playing a major role.  In setting of MSK issues and difficulty ambulating.  TTE relatively reassuring in the past, 2022.  PFTs with restriction and reduced DLCO.  No ILD on high-res CT scan, emphysema present.  Suspect emphysema contributing.  Did not respond well to Advair.  Escalated to Trelegy 7/22 without response.  Trialed as needed bronchodilators fall 2022 without improvement.  Worse after hospitalization 11/2022 after fall and hip fracture.  Likely  deconditioning worsening.  Encourage activity.  Repeat PFTs, CTA PE protocol to evaluate occult PE, now would be chronic following fall and relative immobility.  Retry inhalers, Breztri  via samples today.  Emphysema: Related to history of cigarette smoking.  Contributor symptoms.  Not responding to inhalers.  Suspect deconditioning major issue. Re-evaluate as above.   No follow-ups on file.   Donnice JONELLE Beals, MD 10/14/2023   I spent 40 minutes in the care of the patient today including reviewing records, face-to-face visit, coordination of care.

## 2023-10-14 NOTE — Patient Instructions (Signed)
 Nice to see you again  I ordered an MRI of the abdomen to give down the spot in the kidney, this has been present for a while and I think it is benign but lets just make sure  We will send the note to your eye surgeon so you can get your eye surgery done  The pulmonary function test look better than 3 years ago, the CT scan is stable.  This is good news  To further evaluate your shortness of breath I will send a message to my cardiology colleagues, the heart doctors, see about the right heart catheterization see if we can identify a reason to be short of breath from the heart.  Return to clinic in 2 months or sooner as needed with Dr. Annella

## 2023-10-14 NOTE — Progress Notes (Signed)
 Full pft performed today.

## 2023-10-16 NOTE — Telephone Encounter (Signed)
 10/14/23 ov/risk assessment with Dr. Annella faxed to [facility].

## 2023-10-21 ENCOUNTER — Telehealth (HOSPITAL_COMMUNITY): Payer: Self-pay

## 2023-10-21 NOTE — Telephone Encounter (Signed)
-----   Message from Toribio Fuel sent at 10/19/2023  7:58 PM EDT ----- Regarding: RE: RHC? Absolutely.   Nuha Degner - can you schedule for me one morning this week please if possible? Thanks - dan ----- Message ----- From: Annella Donnice SAUNDERS, MD Sent: 10/14/2023   3:16 PM EDT To: Toribio SAUNDERS Fuel, MD Subject: RHC?                                           Walterine Share, this lady has chronic dyspnea.  PFTs have improved.  No help with inhalers.  DLCO was out of proportion to other PFTs.  TTE in the past looked okay but this is fairly remote at this point, 2022.  Would you mind help arranging a right heart catheterization to evaluate for possible pulmonary hypertension?  I am unable to identify another reason for her dyspnea at this time.  Thanks! Matt

## 2023-10-21 NOTE — Telephone Encounter (Signed)
 Called and spoke with patient, patient reports that he daughter will have to bring her to procedure and she is out of town until 8/2. Patient states she is going to speak with her daughter and see what day would work best, and will call back next week to scheduled.   Provided patient with call back number.   Patient aware and verbalized understanding.

## 2023-10-26 ENCOUNTER — Encounter: Payer: Self-pay | Admitting: Pulmonary Disease

## 2023-10-26 ENCOUNTER — Other Ambulatory Visit (HOSPITAL_COMMUNITY): Payer: Self-pay | Admitting: *Deleted

## 2023-10-26 DIAGNOSIS — R0609 Other forms of dyspnea: Secondary | ICD-10-CM

## 2023-10-26 NOTE — Telephone Encounter (Signed)
 Pt returned call, RHC sch for Fri 8/8 at 12 pm, instructions reviewed with pt and sent via mychart, mess to [facility] for prior auth

## 2023-10-30 ENCOUNTER — Encounter (HOSPITAL_COMMUNITY): Payer: Self-pay | Admitting: Internal Medicine

## 2023-10-30 ENCOUNTER — Encounter (HOSPITAL_COMMUNITY)
Admission: RE | Disposition: A | Discharge status: Home / Self Care | Payer: Self-pay | Source: Home / Self Care | Attending: Internal Medicine

## 2023-10-30 ENCOUNTER — Ambulatory Visit (HOSPITAL_COMMUNITY)
Admission: RE | Admit: 2023-10-30 | Discharge: 2023-10-30 | Disposition: A | Discharge status: Home / Self Care | Attending: Internal Medicine | Admitting: Internal Medicine

## 2023-10-30 DIAGNOSIS — E785 Hyperlipidemia, unspecified: Secondary | ICD-10-CM | POA: Insufficient documentation

## 2023-10-30 DIAGNOSIS — R0609 Other forms of dyspnea: Secondary | ICD-10-CM

## 2023-10-30 DIAGNOSIS — Z96641 Presence of right artificial hip joint: Secondary | ICD-10-CM | POA: Diagnosis not present

## 2023-10-30 DIAGNOSIS — Z79899 Other long term (current) drug therapy: Secondary | ICD-10-CM | POA: Diagnosis not present

## 2023-10-30 DIAGNOSIS — J439 Emphysema, unspecified: Secondary | ICD-10-CM | POA: Insufficient documentation

## 2023-10-30 DIAGNOSIS — Z87891 Personal history of nicotine dependence: Secondary | ICD-10-CM | POA: Insufficient documentation

## 2023-10-30 DIAGNOSIS — I10 Essential (primary) hypertension: Secondary | ICD-10-CM | POA: Diagnosis not present

## 2023-10-30 DIAGNOSIS — Z823 Family history of stroke: Secondary | ICD-10-CM | POA: Diagnosis not present

## 2023-10-30 DIAGNOSIS — I272 Pulmonary hypertension, unspecified: Secondary | ICD-10-CM | POA: Diagnosis not present

## 2023-10-30 HISTORY — PX: RIGHT HEART CATH: CATH118263

## 2023-10-30 LAB — CBC
HCT: 38.1 % (ref 36.0–46.0)
Hemoglobin: 11.9 g/dL — ABNORMAL LOW (ref 12.0–15.0)
MCH: 25.9 pg — ABNORMAL LOW (ref 26.0–34.0)
MCHC: 31.2 g/dL (ref 30.0–36.0)
MCV: 82.8 fL (ref 80.0–100.0)
Platelets: 219 K/uL (ref 150–400)
RBC: 4.6 MIL/uL (ref 3.87–5.11)
RDW: 13.6 % (ref 11.5–15.5)
WBC: 5.7 K/uL (ref 4.0–10.5)
nRBC: 0 % (ref 0.0–0.2)

## 2023-10-30 LAB — POCT I-STAT EG7
Acid-Base Excess: 3 mmol/L — ABNORMAL HIGH (ref 0.0–2.0)
Acid-base deficit: 2 mmol/L (ref 0.0–2.0)
Bicarbonate: 23.5 mmol/L (ref 20.0–28.0)
Bicarbonate: 28.1 mmol/L — ABNORMAL HIGH (ref 20.0–28.0)
Calcium, Ion: 1.08 mmol/L — ABNORMAL LOW (ref 1.15–1.40)
Calcium, Ion: 1.28 mmol/L (ref 1.15–1.40)
HCT: 30 % — ABNORMAL LOW (ref 36.0–46.0)
HCT: 33 % — ABNORMAL LOW (ref 36.0–46.0)
Hemoglobin: 10.2 g/dL — ABNORMAL LOW (ref 12.0–15.0)
Hemoglobin: 11.2 g/dL — ABNORMAL LOW (ref 12.0–15.0)
O2 Saturation: 60 %
O2 Saturation: 61 %
Potassium: 3.8 mmol/L (ref 3.5–5.1)
Potassium: 4.4 mmol/L (ref 3.5–5.1)
Sodium: 139 mmol/L (ref 135–145)
Sodium: 144 mmol/L (ref 135–145)
TCO2: 25 mmol/L (ref 22–32)
TCO2: 30 mmol/L (ref 22–32)
pCO2, Ven: 39.7 mmHg — ABNORMAL LOW (ref 44–60)
pCO2, Ven: 46.5 mmHg (ref 44–60)
pH, Ven: 7.38 (ref 7.25–7.43)
pH, Ven: 7.39 (ref 7.25–7.43)
pO2, Ven: 32 mmHg (ref 32–45)
pO2, Ven: 32 mmHg (ref 32–45)

## 2023-10-30 LAB — BASIC METABOLIC PANEL WITH GFR
Anion gap: 9 (ref 5–15)
BUN: 14 mg/dL (ref 8–23)
CO2: 26 mmol/L (ref 22–32)
Calcium: 9.8 mg/dL (ref 8.9–10.3)
Chloride: 105 mmol/L (ref 98–111)
Creatinine, Ser: 1.64 mg/dL — ABNORMAL HIGH (ref 0.44–1.00)
GFR, Estimated: 33 mL/min — ABNORMAL LOW (ref >60)
Glucose, Bld: 98 mg/dL (ref 70–99)
Potassium: 4.3 mmol/L (ref 3.5–5.1)
Sodium: 140 mmol/L (ref 135–145)

## 2023-10-30 LAB — GLUCOSE, CAPILLARY: Glucose-Capillary: 82 mg/dL (ref 70–99)

## 2023-10-30 SURGERY — RIGHT HEART CATH
Anesthesia: LOCAL

## 2023-10-30 MED ORDER — SODIUM CHLORIDE 0.9% FLUSH: 3.0000 mL | INTRAVENOUS | Status: DC | PRN

## 2023-10-30 MED ORDER — HYDRALAZINE HCL 20 MG/ML IJ SOLN: 10.0000 mg | INTRAMUSCULAR | Status: DC | PRN

## 2023-10-30 MED ORDER — FREE WATER
250.0000 mL | Freq: Once | Status: AC
  Administered 2023-10-30: 250 mL via ORAL

## 2023-10-30 MED ORDER — LIDOCAINE HCL (PF) 1 % IJ SOLN
INTRAMUSCULAR | Status: DC | PRN
  Administered 2023-10-30: 5 mL

## 2023-10-30 MED ORDER — LABETALOL HCL 5 MG/ML IV SOLN: 10.0000 mg | INTRAVENOUS | Status: DC | PRN

## 2023-10-30 MED ORDER — SODIUM CHLORIDE 0.9% FLUSH: 3.0000 mL | Freq: Two times a day (BID) | INTRAVENOUS | Status: DC

## 2023-10-30 MED ORDER — ACETAMINOPHEN 325 MG PO TABS: 650.0000 mg | ORAL_TABLET | ORAL | Status: DC | PRN

## 2023-10-30 MED ORDER — SODIUM CHLORIDE 0.9 % IV SOLN: 250.0000 mL | INTRAVENOUS | Status: DC | PRN

## 2023-10-30 MED ORDER — LIDOCAINE HCL (PF) 1 % IJ SOLN
INTRAMUSCULAR | Status: AC
  Filled 2023-10-30: qty 30

## 2023-10-30 MED ORDER — ONDANSETRON HCL 4 MG/2ML IJ SOLN: 4.0000 mg | Freq: Four times a day (QID) | INTRAMUSCULAR | Status: DC | PRN

## 2023-10-30 MED ORDER — HEPARIN (PORCINE) IN NACL 1000-0.9 UT/500ML-% IV SOLN
INTRAVENOUS | Status: DC | PRN
  Administered 2023-10-30: 1000 mL via SURGICAL_CAVITY

## 2023-10-30 SURGICAL SUPPLY — 5 items
CATH SWAN GANZ 7F STRAIGHT (CATHETERS) IMPLANT
GLIDESHEATH SLENDER 7FR .021G (SHEATH) IMPLANT
PACK CARDIAC CATHETERIZATION (CUSTOM PROCEDURE TRAY) ×1 IMPLANT
TRANSDUCER W/STOPCOCK (MISCELLANEOUS) IMPLANT
TUBING ART PRESS 72 MALE/FEM (TUBING) IMPLANT

## 2023-10-30 NOTE — Interval H&P Note (Signed)
 History and Physical Interval Note:  10/30/2023 11:19 AM  Sophia Rodriguez  has presented today for surgery, with the diagnosis of dyspnea.  The various methods of treatment have been discussed with the patient and family. After consideration of risks, benefits and other options for treatment, the patient has consented to  Procedure(s): RIGHT HEART CATH (N/A) as a surgical intervention.  The patient's history has been reviewed, patient examined, no change in status, stable for surgery.  I have reviewed the patient's chart and labs.  Questions were answered to the patient's satisfaction.     Mostafa Yuan

## 2023-10-30 NOTE — Discharge Instructions (Signed)

## 2023-11-02 ENCOUNTER — Telehealth: Payer: Self-pay

## 2023-11-02 NOTE — Telephone Encounter (Signed)
 Patient has had the procedure and looks like no auth was required that is on the order from pre service center

## 2023-11-02 NOTE — Telephone Encounter (Signed)
-----   Message from Nurse Powell S sent at 10/30/2023  4:11 PM EDT ----- No the ordering/referring provider is responsible for the prior auth ----- Message ----- From: Armand Burnard SAUNDERS, CMA Sent: 10/29/2023   1:57 PM EDT To: Powell CHRISTELLA Latino, RN  Correct me if I'm wrong, but I believe since its insurance wouldn't the place of referral would do this on their end ? ----- Message ----- From: Latino Powell CHRISTELLA, RN Sent: 10/26/2023  10:32 AM EDT To: [facility]  Not sure who can help with this, Dr Annella referred pt for right heart cath with Dr Cherrie which is sch for Fri 8/8, please check on insurance [place], [place] [id number]  thanks

## 2023-11-14 ENCOUNTER — Other Ambulatory Visit

## 2023-11-21 ENCOUNTER — Ambulatory Visit
Admission: RE | Admit: 2023-11-21 | Discharge: 2023-11-21 | Disposition: A | Discharge status: Home / Self Care | Source: Ambulatory Visit | Attending: Pulmonary Disease | Admitting: Pulmonary Disease

## 2023-11-21 DIAGNOSIS — N289 Disorder of kidney and ureter, unspecified: Secondary | ICD-10-CM

## 2023-11-21 MED ORDER — GADOPICLENOL 0.5 MMOL/ML IV SOLN
5.5000 mL | Freq: Once | INTRAVENOUS | Status: AC | PRN
  Administered 2023-11-21: 5.5 mL via INTRAVENOUS

## 2023-12-31 ENCOUNTER — Encounter: Payer: Self-pay | Admitting: Pulmonary Disease

## 2023-12-31 ENCOUNTER — Ambulatory Visit: Admitting: Pulmonary Disease

## 2023-12-31 VITALS — BP 113/74 | HR 90 | Temp 98.0°F | Ht 65.0 in | Wt 186.4 lb

## 2023-12-31 DIAGNOSIS — I270 Primary pulmonary hypertension: Secondary | ICD-10-CM | POA: Diagnosis not present

## 2023-12-31 MED ORDER — SILDENAFIL CITRATE 20 MG PO TABS: 20.0000 mg | ORAL_TABLET | Freq: Three times a day (TID) | ORAL | 2 refills | Status: AC

## 2023-12-31 NOTE — Patient Instructions (Addendum)
 Nice to see you again  I am sorry none of the inhalers of help  Your right heart catheterization did reveal pulmonary hypertension.  This can cause significant shortness of breath.  The treatment for this condition, pulmonary hypertension, is quite complicated and with your emphysema makes things challenging.  To try to treat this, take sildenafil 20 mg tablet, 1 tablet 3 times a day.  Please stop this medication let me know if you feel more short of breath or you notice your oxygen saturation is dropping below 90.  Return to clinic in 2 months or sooner as needed with Dr. Annella

## 2023-12-31 NOTE — Progress Notes (Signed)
 @Patient  ID: Sophia Rodriguez, female    DOB: Sep 03, 1950, 73 y.o.   MRN: [id number]  No chief complaint on file.   Referring provider: Shelda Atlas, MD  HPI:   73 y.o. whom we are seeing for follow up of dyspnea on exertion.  DOE stable - no worse.  Not improved with inhalers including Trelegy and Breztri  and other inhalers.  No ILD or worsening imaging findings.  Pursued right heart catheterization and this did result in mean PA pressure of 28, wedge of 11, PVR of 3.2.  We discussed at length the challenges of treating pulmonary hypertension.  And in particular with her with emphysema on CT scan.  We discussed risks and benefits.  We discussed adverse reactions anticipatory management if things were to worsen.  We agreed to try short acting vasodilator, sildenafil to start.   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  This did not inhibit ability to obtain history.  She has dyspnea on exertion for some months worse in the last 2 or 3 months.  Symptoms worse with exertion.  Worse with inclines or stairs.  Can be present on flat surface as well.  No positional changes that make things worse, when she rests or lies down symptoms are better.  No environmental or seasonal changes that make things better or worse.  She does endorse significant seasonal allergies and nasal congestion.  This has been a little bit worse with the pollen season of late.  No other alleviating or exacerbating factors.  She does note that she had been a bit less active over the last several months.  This is somewhat due to her shortness of breath also due to joint issues.  She had planned to have hip replacement recently but this was delayed given her worsening shortness of breath symptoms.  Most recent chest x-ray as I can view 12/2012 reveals clear lungs bilaterally without infiltrate or effusion.  She had a chest x-ray that can be viewed in  Care Everywhere 05/2020 report reviewed which reveals no acute cardiopulmonary disease.  TTE 04/2020 reviewed which revealed normal EF, grade 1 diastolic dysfunction, otherwise reassuring.  PMH: Hyperlipidemia, seasonal allergies, hypertension Surgical history: Hip arthroplasty on the right Family history: Father with CVA, brother with CVA Social history: Former smoker, 10 pack-year history, quit in the 1980s per her report  Questionaires / Pulmonary Flowsheets:   ACT:      No data to display          MMRC:     No data to display          Epworth:      No data to display          Tests:   FENO:  No results found for: NITRICOXIDE  PFT:    Latest Ref Rng & Units 10/14/2023    1:45 PM 08/06/2020   11:07 AM  PFT Results  FVC-Pre L 2.22  1.78   FVC-Predicted Pre % 71  69   FVC-Post L 2.37  1.53   FVC-Predicted Post % 76  60   Pre FEV1/FVC % % 79  77   Post FEV1/FCV % % 74  75   FEV1-Pre L 1.75  1.37   FEV1-Predicted Pre % 75  69   FEV1-Post L 1.74  1.15   DLCO uncorrected ml/min/mmHg 8.85  9.31   DLCO UNC% % 44  45   DLCO corrected  ml/min/mmHg  9.91   DLCO COR %Predicted %  48   DLVA Predicted % 66  74   TLC L 4.06  3.58   TLC % Predicted % 78  68   RV % Predicted % 85  72   Personally reviewed and interpreted as spirometry suggestive of moderate restriction versus gas trapping.  TLC confirms mild restriction.  DLCO is severely reduced.  Compared to 3 years prior, all values are improved excluding DLCO, that is stable.  WALK:      No data to display          Imaging: Personally reviewed and as per EMR discussion this note  Lab Results: Personally reviewed, no significant elevation in eosinophils, hemoglobin stable slightly low at 11.6 CBC    Component Value Date/Time   WBC 5.7 10/30/2023 1059   RBC 4.60 10/30/2023 1059   HGB 10.2 (L) 10/30/2023 1154   HGB 11.2 (L) 10/30/2023 1154   HCT 30.0 (L) 10/30/2023 1154   HCT 33.0 (L) 10/30/2023  1154   PLT 219 10/30/2023 1059   MCV 82.8 10/30/2023 1059   MCH 25.9 (L) 10/30/2023 1059   MCHC 31.2 10/30/2023 1059   RDW 13.6 10/30/2023 1059   LYMPHSABS 0.9 10/01/2021 0921   MONOABS 0.5 10/01/2021 0921   EOSABS 0.2 10/01/2021 0921   BASOSABS 0.0 10/01/2021 0921    BMET    Component Value Date/Time   NA 144 10/30/2023 1154   NA 139 10/30/2023 1154   NA 142 06/21/2021 0839   K 3.8 10/30/2023 1154   K 4.4 10/30/2023 1154   CL 105 10/30/2023 1059   CO2 26 10/30/2023 1059   GLUCOSE 98 10/30/2023 1059   BUN 14 10/30/2023 1059   BUN 10 06/21/2021 0839   CREATININE 1.64 (H) 10/30/2023 1059   CREATININE 1.40 (H) 01/14/2022 1621   CALCIUM  9.8 10/30/2023 1059   GFRNONAA 33 (L) 10/30/2023 1059   GFRNONAA 27 (L) 04/26/2020 1014   GFRAA 31 (L) 04/26/2020 1014    BNP    Component Value Date/Time   BNP 18.8 05/20/2021 1623    ProBNP No results found for: PROBNP  Specialty Problems   None  No Known Allergies  Immunization History  Administered Date(s) Administered   PFIZER(Purple Top)SARS-COV-2 Vaccination 05/07/2019, 06/01/2019, 01/03/2020    Past Medical History:  Diagnosis Date   Anxiety    Arthritis    Chronic kidney disease    related to blood pressure meds- pt reports being taken off of meds    Complication of anesthesia    slow to wake up many years ago per pt   Depression    Diabetes mellitus    hx of not on meds, not on meds in over a year    Dyspnea    with exertion   Edema    in legs per pt    Hypertension    Stroke (HCC)    x 3 weakness on right side     Tobacco History: Social History   Tobacco Use  Smoking Status Former   Current packs/day: 0.00   Types: Cigarettes   Start date: 89   Quit date: 05/28/2007   Years since quitting: 16.6  Smokeless Tobacco Never   Counseling given: Not Answered   Continue to not smoke  Outpatient Encounter Medications as of 12/31/2023  Medication Sig   acetaminophen  (TYLENOL ) 500 MG tablet Take  1,000 mg by mouth daily as needed (pain.).   alendronate (FOSAMAX) 70 MG tablet  Take 70 mg by mouth every Monday.   aspirin  EC 81 MG tablet Take 81 mg by mouth in the morning.   atorvastatin  (LIPITOR) 40 MG tablet Take 1 tablet (40 mg total) by mouth daily at 6 PM. (Patient taking differently: Take 40 mg by mouth in the morning.)   cetirizine (ZYRTEC) 10 MG tablet Take 10 mg by mouth at bedtime.   docusate sodium  (COLACE) 100 MG capsule Take 1 capsule (100 mg total) by mouth 2 (two) times daily.   escitalopram  (LEXAPRO ) 10 MG tablet Take 10 mg by mouth daily at 6 PM.   furosemide  (LASIX ) 20 MG tablet Take 20 mg by mouth daily as needed (swelling/fluid retention.).   gabapentin  (NEURONTIN ) 100 MG capsule Take 100 mg by mouth 3 (three) times daily.   ketorolac (ACULAR) 0.5 % ophthalmic solution Place 1 drop into the right eye 4 (four) times daily.   lisinopril  (ZESTRIL ) 10 MG tablet Take 10 mg by mouth in the morning.   Multiple Vitamins-Minerals (ONE-A-DAY WOMENS PETITES PO) Take 2 tablets by mouth in the morning.   prednisoLONE acetate (PRED FORTE) 1 % ophthalmic suspension INSTILL 1 DROP IN THE RIGHT EYE FOUR TIMES DAILY FOR 1 WEEK THEN THREE TIMES DAILY FOR 1 WEEK THENBID FOR 1 WEEK AND ONCE A DAY FOR 1 WEEK   sildenafil (REVATIO) 20 MG tablet Take 1 tablet (20 mg total) by mouth 3 (three) times daily.   zolpidem  (AMBIEN  CR) 12.5 MG CR tablet Take 12.5 mg by mouth at bedtime.   [DISCONTINUED] diclofenac  Sodium (VOLTAREN ) 1 % GEL Apply 1 Application topically 4 (four) times daily as needed (pain.).   No facility-administered encounter medications on file as of 12/31/2023.     Review of Systems  Review of Systems  N/a Physical Exam  BP 113/74   Pulse 90   Temp 98 F (36.7 C) (Oral)   Ht 5' 5 (1.651 m)   Wt 186 lb 6.4 oz (84.6 kg)   SpO2 96%   BMI 31.02 kg/m   Wt Readings from Last 5 Encounters:  12/31/23 186 lb 6.4 oz (84.6 kg)  10/30/23 180 lb (81.6 kg)  10/14/23 186 lb 9.6  oz (84.6 kg)  07/29/23 184 lb 6.4 oz (83.6 kg)  05/28/23 182 lb 14.4 oz (83 kg)    BMI Readings from Last 5 Encounters:  12/31/23 31.02 kg/m  10/30/23 29.95 kg/m  10/14/23 31.05 kg/m  07/29/23 30.69 kg/m  05/28/23 30.44 kg/m     Physical Exam General: Frail, in no distress Eyes: EOMI, no icterus Neck: Supple no JVP Cardiovascular: Regular rhythm, no murmur Pulmonary: Clear to auscultation bilaterally, normal work of breathing Abdomen: Nondistended, bowel sounds present  MSK: No synovitis, joint  neuro: Normal gait, no weakness Psych: Normal mood, flat affect   Assessment & Plan:   DOE: Suspect multifactorial but concern for deconditioning playing a major role.  In setting of MSK issues and difficulty ambulating.  TTE relatively reassuring in the past, 2022.  PFTs with restriction and reduced DLCO.  No ILD on high-res CT scan, emphysema present.  Suspect emphysema contributing.  Did not respond well to Advair.  Escalated to Trelegy 7/22 without response.  Trialed as needed bronchodilators fall 2022 without improvement.  Worse after hospitalization 11/2022 after fall and hip fracture.  Likely deconditioning worsening.  Encourage activity.  Repeat PFTs improved, CTA without any worsening parenchymal findings and no occult PE acute or chronic.  No improvement with Breztri .  Okay to stop this.  Given DLCO out of proportion to PFTs , right heart catheterization performed which did demonstrate a mean PA pressure of 28, consistent with pulmonary hypertension.  This is likely a significant contributor to her dyspnea.  Emphysema: Related to history of cigarette smoking.  Suspect contributing to dyspnea but no improvement despite treatment with triple inhaled therapy.  Okay to stay off inhalers for now.  Pulmonary hypertension: Primarily Group 1 disease with elevated PVR 3.2, mean PA pressure 28, normal wedge less than 15, wedge was 11.  Will prescribe sildenafil 20 mg 3 times daily.  She  has emphysema which makes systemic pulmonary vasodilators challenging.  She has no hypoxemia so it is felt group 2 disease is unlikely.  Consider adding low-dose Lasix  in the future   Return in about 2 months (around 03/01/2024) for f/u Dr. Annella.   Donnice JONELLE Annella, MD 12/31/2023  I spent 41 minutes in the care of the patient including face-to-face visit, review of records, coordination of care.

## 2024-01-04 ENCOUNTER — Telehealth: Payer: Self-pay | Admitting: Pulmonary Disease

## 2024-01-04 NOTE — Telephone Encounter (Unsigned)
 Copied from CRM [id number]. Topic: Clinical - Prescription Issue >> Jan 04, 2024  1:23 PM Ismael A wrote: Reason for CRM: Patient states she was prescribed sildenafil (REVATIO) 20 MG tablet for pulmonary hypertension and emphysema but it costs around $500 out of pocket and she cannot afford that - ph# [phone]

## 2024-01-06 ENCOUNTER — Telehealth: Payer: Self-pay

## 2024-01-06 ENCOUNTER — Other Ambulatory Visit (HOSPITAL_COMMUNITY): Payer: Self-pay

## 2024-01-06 NOTE — Telephone Encounter (Signed)
 Routing to PA team to see if there is something we can do to get this revatio affordable for the pt, thanks!

## 2024-01-06 NOTE — Telephone Encounter (Signed)
*  Pulm  Pharmacy Patient Advocate Encounter   Received notification from RX Request Messages that prior authorization for Sildenafil Citrate (PAH) 20MG  tablets   is required/requested.   Insurance verification completed.   The patient is insured through [facility].   Per test claim: PA required; PA submitted to above mentioned insurance via Latent Key/confirmation #/EOC [id number] Status is pending

## 2024-01-06 NOTE — Telephone Encounter (Signed)
 Pharmacy Patient Advocate Encounter  Received notification from [facility] that Prior Authorization for Sildenafil 20mg  has been APPROVED from 01/06/2024 to 07/06/2024

## 2024-01-07 NOTE — Telephone Encounter (Signed)
 Pt is aware.

## 2024-01-25 ENCOUNTER — Telehealth: Payer: Self-pay

## 2024-01-25 NOTE — Telephone Encounter (Signed)
 Copied from CRM [id number]. Topic: Clinical - Prescription Issue >> Jan 25, 2024  2:26 PM Sophia Rodriguez wrote: Reason for CRM: Patient states the sildenafil (REVATIO) 20 MG tablet  that Dr. Annella prescribed her made her very sleepy and she stopped taking it 1 week ago and would like to discuss what he would advise.    Per Dr Annella  [facility] - stay off and make sure feels ok for a few weeks. Can try a different medicine in the future.    Spoke with patient VBU.   -NFN

## 2024-03-03 ENCOUNTER — Ambulatory Visit: Admitting: Pulmonary Disease

## 2024-03-03 ENCOUNTER — Encounter: Payer: Self-pay | Admitting: Pulmonary Disease

## 2024-03-03 VITALS — BP 116/66 | HR 83 | Temp 98.2°F | Ht 65.0 in | Wt 186.4 lb

## 2024-03-03 DIAGNOSIS — I270 Primary pulmonary hypertension: Secondary | ICD-10-CM

## 2024-03-03 NOTE — Progress Notes (Signed)
 @Patient  ID: Sophia Rodriguez, female    DOB: [birth year], 73 y.o.   MRN: [id number]  Chief Complaint  Patient presents with   pulmonary hypertension    3 month follow up     Referring provider: Shelda Atlas, MD  HPI:   73 y.o. whom we are seeing for follow up of dyspnea on exertion.  DOE stable - no worse. Not improved with escalation to multiple inhalers including triple inhaled therapy.  At last visit reviewed right heart cath with very mild PAH mean PA pressure 28 PVR 3.2.  Try sildenafil  to see if help with her breathing.  Fortunately very sleepy.  And if it helped and she slept all the time.  Given mild PH and low risk decided no further medicines this time   HPI initial visit: Patient has had CVAs in the past and sometimes has word finding difficulty, slow word finding.  This did not inhibit ability to obtain history.  She has dyspnea on exertion for some months worse in the last 2 or 3 months.  Symptoms worse with exertion.  Worse with inclines or stairs.  Can be present on flat surface as well.  No positional changes that make things worse, when she rests or lies down symptoms are better.  No environmental or seasonal changes that make things better or worse.  She does endorse significant seasonal allergies and nasal congestion.  This has been a little bit worse with the pollen season of late.  No other alleviating or exacerbating factors.  She does note that she had been a bit less active over the last several months.  This is somewhat due to her shortness of breath also due to joint issues.  She had planned to have hip replacement recently but this was delayed given her worsening shortness of breath symptoms.  Most recent chest x-ray as I can view 12/2012 reveals clear lungs bilaterally without infiltrate or effusion.  She had a chest x-ray that can be viewed in Care Everywhere 05/2020 report reviewed which reveals no acute cardiopulmonary disease.  TTE 04/2020 reviewed which  revealed normal EF, grade 1 diastolic dysfunction, otherwise reassuring.  PMH: Hyperlipidemia, seasonal allergies, hypertension Surgical history: Hip arthroplasty on the right Family history: Father with CVA, brother with CVA Social history: Former smoker, 10 pack-year history, quit in the 1980s per her report  Questionaires / Pulmonary Flowsheets:   ACT:      No data to display          MMRC:     No data to display          Epworth:      No data to display          Tests:   FENO:  No results found for: NITRICOXIDE  PFT:    Latest Ref Rng & Units 10/14/2023    1:45 PM 08/06/2020   11:07 AM  PFT Results  FVC-Pre L 2.22  1.78   FVC-Predicted Pre % 71  69   FVC-Post L 2.37  1.53   FVC-Predicted Post % 76  60   Pre FEV1/FVC % % 79  77   Post FEV1/FCV % % 74  75   FEV1-Pre L 1.75  1.37   FEV1-Predicted Pre % 75  69   FEV1-Post L 1.74  1.15   DLCO uncorrected ml/min/mmHg 8.85  9.31   DLCO UNC% % 44  45   DLCO corrected ml/min/mmHg  9.91   DLCO COR %Predicted %  48   DLVA Predicted % 66  74   TLC L 4.06  3.58   TLC % Predicted % 78  68   RV % Predicted % 85  72   Personally reviewed and interpreted as spirometry suggestive of moderate restriction versus gas trapping.  TLC confirms mild restriction.  DLCO is severely reduced.  Compared to 3 years prior, all values are improved excluding DLCO, that is stable.  WALK:      No data to display          Imaging: Personally reviewed and as per EMR discussion this note  Lab Results: Personally reviewed, no significant elevation in eosinophils, hemoglobin stable slightly low at 11.6 CBC    Component Value Date/Time   WBC 5.7 10/30/2023 1059   RBC 4.60 10/30/2023 1059   HGB 10.2 (L) 10/30/2023 1154   HGB 11.2 (L) 10/30/2023 1154   HCT 30.0 (L) 10/30/2023 1154   HCT 33.0 (L) 10/30/2023 1154   PLT 219 10/30/2023 1059   MCV 82.8 10/30/2023 1059   MCH 25.9 (L) 10/30/2023 1059   MCHC 31.2 10/30/2023  1059   RDW 13.6 10/30/2023 1059   LYMPHSABS 0.9 10/01/2021 0921   MONOABS 0.5 10/01/2021 0921   EOSABS 0.2 10/01/2021 0921   BASOSABS 0.0 10/01/2021 0921    BMET    Component Value Date/Time   NA 144 10/30/2023 1154   NA 139 10/30/2023 1154   NA 142 06/21/2021 0839   K 3.8 10/30/2023 1154   K 4.4 10/30/2023 1154   CL 105 10/30/2023 1059   CO2 26 10/30/2023 1059   GLUCOSE 98 10/30/2023 1059   BUN 14 10/30/2023 1059   BUN 10 06/21/2021 0839   CREATININE 1.64 (H) 10/30/2023 1059   CREATININE 1.40 (H) 01/14/2022 1621   CALCIUM  9.8 10/30/2023 1059   GFRNONAA 33 (L) 10/30/2023 1059   GFRNONAA 27 (L) 04/26/2020 1014   GFRAA 31 (L) 04/26/2020 1014    BNP    Component Value Date/Time   BNP 18.8 05/20/2021 1623    ProBNP No results found for: PROBNP  Specialty Problems   None  No Known Allergies  Immunization History  Administered Date(s) Administered   Fluad Trivalent(High Dose 65+) 02/25/2024   PFIZER(Purple Top)SARS-COV-2 Vaccination 05/07/2019, 06/01/2019, 01/03/2020    Past Medical History:  Diagnosis Date   Anxiety    Arthritis    Chronic kidney disease    related to blood pressure meds- pt reports being taken off of meds    Complication of anesthesia    slow to wake up many years ago per pt   Depression    Diabetes mellitus    hx of not on meds, not on meds in over a year    Dyspnea    with exertion   Edema    in legs per pt    Hypertension    Stroke (HCC)    x 3 weakness on right side     Tobacco History: Social History   Tobacco Use  Smoking Status Former   Current packs/day: 0.00   Types: Cigarettes   Start date: 62   Quit date: 05/28/2007   Years since quitting: 16.7  Smokeless Tobacco Never   Counseling given: Not Answered   Continue to not smoke  Outpatient Encounter Medications as of 03/03/2024  Medication Sig   acetaminophen  (TYLENOL ) 500 MG tablet Take 1,000 mg by mouth daily as needed (pain.).   alendronate (FOSAMAX) 70  MG tablet Take 70 mg  by mouth every Monday.   aspirin  EC 81 MG tablet Take 81 mg by mouth in the morning.   atorvastatin  (LIPITOR) 40 MG tablet Take 1 tablet (40 mg total) by mouth daily at 6 PM. (Patient taking differently: Take 40 mg by mouth in the morning.)   cetirizine (ZYRTEC) 10 MG tablet Take 10 mg by mouth at bedtime.   docusate sodium  (COLACE) 100 MG capsule Take 1 capsule (100 mg total) by mouth 2 (two) times daily.   escitalopram  (LEXAPRO ) 10 MG tablet Take 10 mg by mouth daily at 6 PM.   furosemide  (LASIX ) 20 MG tablet Take 20 mg by mouth daily as needed (swelling/fluid retention.).   gabapentin  (NEURONTIN ) 100 MG capsule Take 100 mg by mouth 3 (three) times daily.   lisinopril  (ZESTRIL ) 10 MG tablet Take 10 mg by mouth in the morning.   Multiple Vitamins-Minerals (ONE-A-DAY WOMENS PETITES PO) Take 2 tablets by mouth in the morning.   zolpidem  (AMBIEN  CR) 12.5 MG CR tablet Take 12.5 mg by mouth at bedtime.   sildenafil  (REVATIO ) 20 MG tablet Take 1 tablet (20 mg total) by mouth 3 (three) times daily. (Patient not taking: Reported on 03/03/2024)   [DISCONTINUED] ketorolac (ACULAR) 0.5 % ophthalmic solution Place 1 drop into the right eye 4 (four) times daily.   [DISCONTINUED] prednisoLONE acetate (PRED FORTE) 1 % ophthalmic suspension INSTILL 1 DROP IN THE RIGHT EYE FOUR TIMES DAILY FOR 1 WEEK THEN THREE TIMES DAILY FOR 1 WEEK THENBID FOR 1 WEEK AND ONCE A DAY FOR 1 WEEK   No facility-administered encounter medications on file as of 03/03/2024.     Review of Systems  Review of Systems  N/a Physical Exam  BP 116/66   Pulse 83   Temp 98.2 F (36.8 C) (Oral)   Ht 5' 5 (1.651 m)   Wt 186 lb 6.4 oz (84.6 kg)   SpO2 95%   BMI 31.02 kg/m   Wt Readings from Last 5 Encounters:  03/03/24 186 lb 6.4 oz (84.6 kg)  12/31/23 186 lb 6.4 oz (84.6 kg)  10/30/23 180 lb (81.6 kg)  10/14/23 186 lb 9.6 oz (84.6 kg)  07/29/23 184 lb 6.4 oz (83.6 kg)    BMI Readings from Last 5  Encounters:  03/03/24 31.02 kg/m  12/31/23 31.02 kg/m  10/30/23 29.95 kg/m  10/14/23 31.05 kg/m  07/29/23 30.69 kg/m     Physical Exam General: No distress in chair Eyes: No icterus Neck: No JVP Cardiovascular: Warm, no edema Pulmonary: Clear bilaterally, normal work of breathing Abdomen: Nondistended   Assessment & Plan:   DOE: Suspect multifactorial but concern for deconditioning playing a major role.  In setting of MSK issues and difficulty ambulating.  TTE relatively reassuring in the past, 2022.  PFTs with restriction and reduced DLCO.  No ILD on high-res CT scan, emphysema present.  Suspect emphysema contributing.  Did not respond well to Advair.  Escalated to Trelegy 7/22 without response.  Trialed as needed bronchodilators fall 2022 without improvement.  Worse after hospitalization 11/2022 after fall and hip fracture.  Likely deconditioning worsening.  Encourage activity.  Repeat PFTs improved, CTA without any worsening parenchymal findings and no occult PE acute or chronic.  No improvement with Breztri .  Okay to stop this.  Given DLCO out of proportion to PFTs , right heart catheterization performed which did demonstrate a mean PA pressure of 28, consistent with pulmonary hypertension.  This is likely a significant contributor to her dyspnea.  Unfortunate, not tolerating  PH medications as discussed below.  Emphysema: Related to history of cigarette smoking.  Suspect contributing to dyspnea but no improvement despite treatment with triple inhaled therapy.  Okay to stay off inhalers for now.  Pulmonary hypertension: Primarily Group 1 disease with elevated PVR 3.2, mean PA pressure 28, normal wedge less than 15, wedge was 11.  Prescribed sildenafil  20 mg 3 times daily at last visit.  40-minute very tired.  No worsening bradycardia.  But since he was so tired sleeps most of the day.  Did not notice if breathing improved at all.  Her reveal light score at maximum is 4, we do not have  BNP or wall data.  6-minute walk test ordered.  Will check BNP at next visit.  Reevaluate pulmonary desires, riociguat versus ERA pathway in the future given intolerance to sildenafil .  Only if risks score increases.  Or symptoms worsen.  Return in about 3 months (around 06/01/2024) for f/u Dr. Annella.   Donnice JONELLE Annella, MD 03/03/2024

## 2024-06-01 ENCOUNTER — Ambulatory Visit: Admitting: Pulmonary Disease
